# Patient Record
Sex: Female | Born: 1955 | Race: Black or African American | Hispanic: No | Smoking: Former smoker
Health system: Southern US, Community
[De-identification: ages and names within clinical notes are randomized; demographics above are authoritative.]

## PROBLEM LIST (undated history)

## (undated) DIAGNOSIS — K635 Polyp of colon: Secondary | ICD-10-CM

## (undated) DIAGNOSIS — R51 Headache: Secondary | ICD-10-CM

## (undated) DIAGNOSIS — G4733 Obstructive sleep apnea (adult) (pediatric): Secondary | ICD-10-CM

## (undated) DIAGNOSIS — M779 Enthesopathy, unspecified: Secondary | ICD-10-CM

## (undated) DIAGNOSIS — L309 Dermatitis, unspecified: Secondary | ICD-10-CM

## (undated) DIAGNOSIS — L509 Urticaria, unspecified: Secondary | ICD-10-CM

## (undated) DIAGNOSIS — F419 Anxiety disorder, unspecified: Secondary | ICD-10-CM

## (undated) DIAGNOSIS — M1990 Unspecified osteoarthritis, unspecified site: Secondary | ICD-10-CM

## (undated) DIAGNOSIS — M545 Low back pain, unspecified: Secondary | ICD-10-CM

## (undated) DIAGNOSIS — G4700 Insomnia, unspecified: Secondary | ICD-10-CM

## (undated) DIAGNOSIS — I10 Essential (primary) hypertension: Secondary | ICD-10-CM

## (undated) DIAGNOSIS — J45909 Unspecified asthma, uncomplicated: Secondary | ICD-10-CM

## (undated) DIAGNOSIS — I728 Aneurysm of other specified arteries: Secondary | ICD-10-CM

## (undated) DIAGNOSIS — F329 Major depressive disorder, single episode, unspecified: Secondary | ICD-10-CM

## (undated) DIAGNOSIS — G8929 Other chronic pain: Secondary | ICD-10-CM

## (undated) DIAGNOSIS — F32A Depression, unspecified: Secondary | ICD-10-CM

## (undated) DIAGNOSIS — E119 Type 2 diabetes mellitus without complications: Secondary | ICD-10-CM

## (undated) DIAGNOSIS — T783XXA Angioneurotic edema, initial encounter: Secondary | ICD-10-CM

## (undated) DIAGNOSIS — M810 Age-related osteoporosis without current pathological fracture: Secondary | ICD-10-CM

## (undated) DIAGNOSIS — K219 Gastro-esophageal reflux disease without esophagitis: Secondary | ICD-10-CM

## (undated) DIAGNOSIS — B1920 Unspecified viral hepatitis C without hepatic coma: Secondary | ICD-10-CM

## (undated) DIAGNOSIS — Z8679 Personal history of other diseases of the circulatory system: Secondary | ICD-10-CM

## (undated) DIAGNOSIS — I499 Cardiac arrhythmia, unspecified: Secondary | ICD-10-CM

## (undated) HISTORY — DX: Polyp of colon: K63.5

## (undated) HISTORY — DX: Major depressive disorder, single episode, unspecified: F32.9

## (undated) HISTORY — DX: Depression, unspecified: F32.A

## (undated) HISTORY — DX: Anxiety disorder, unspecified: F41.9

## (undated) HISTORY — DX: Angioneurotic edema, initial encounter: T78.3XXA

## (undated) HISTORY — PX: CARDIOVERSION: SHX1299

## (undated) HISTORY — DX: Urticaria, unspecified: L50.9

## (undated) HISTORY — DX: Essential (primary) hypertension: I10

## (undated) HISTORY — DX: Age-related osteoporosis without current pathological fracture: M81.0

## (undated) HISTORY — DX: Type 2 diabetes mellitus without complications: E11.9

## (undated) HISTORY — DX: Aneurysm of other specified arteries: I72.8

## (undated) HISTORY — DX: Obstructive sleep apnea (adult) (pediatric): G47.33

---

## 1997-04-13 ENCOUNTER — Encounter: Admission: RE | Admit: 1997-04-13 | Discharge: 1997-04-13 | Payer: Self-pay | Admitting: Family Medicine

## 1997-07-28 ENCOUNTER — Encounter: Admission: RE | Admit: 1997-07-28 | Discharge: 1997-07-28 | Payer: Self-pay | Admitting: Family Medicine

## 1997-08-18 ENCOUNTER — Encounter: Admission: RE | Admit: 1997-08-18 | Discharge: 1997-08-18 | Payer: Self-pay | Admitting: Family Medicine

## 1998-04-13 ENCOUNTER — Encounter: Admission: RE | Admit: 1998-04-13 | Discharge: 1998-04-13 | Payer: Self-pay | Admitting: Family Medicine

## 1998-04-20 ENCOUNTER — Encounter: Admission: RE | Admit: 1998-04-20 | Discharge: 1998-04-20 | Payer: Self-pay | Admitting: Family Medicine

## 1998-07-22 ENCOUNTER — Encounter: Admission: RE | Admit: 1998-07-22 | Discharge: 1998-07-22 | Payer: Self-pay | Admitting: Family Medicine

## 1998-10-27 ENCOUNTER — Inpatient Hospital Stay (HOSPITAL_COMMUNITY): Admission: EM | Admit: 1998-10-27 | Discharge: 1998-10-29 | Payer: Self-pay | Admitting: Emergency Medicine

## 1998-10-27 ENCOUNTER — Encounter: Payer: Self-pay | Admitting: Emergency Medicine

## 1998-11-21 ENCOUNTER — Encounter: Admission: RE | Admit: 1998-11-21 | Discharge: 1998-11-21 | Payer: Self-pay | Admitting: Family Medicine

## 1999-01-11 ENCOUNTER — Encounter: Admission: RE | Admit: 1999-01-11 | Discharge: 1999-01-11 | Payer: Self-pay | Admitting: Family Medicine

## 1999-01-22 ENCOUNTER — Emergency Department (HOSPITAL_COMMUNITY): Admission: EM | Admit: 1999-01-22 | Discharge: 1999-01-22 | Payer: Self-pay | Admitting: Emergency Medicine

## 1999-02-03 ENCOUNTER — Encounter: Admission: RE | Admit: 1999-02-03 | Discharge: 1999-02-03 | Payer: Self-pay | Admitting: Family Medicine

## 1999-03-03 ENCOUNTER — Encounter: Admission: RE | Admit: 1999-03-03 | Discharge: 1999-03-03 | Payer: Self-pay | Admitting: Family Medicine

## 1999-03-28 ENCOUNTER — Encounter: Admission: RE | Admit: 1999-03-28 | Discharge: 1999-03-28 | Payer: Self-pay | Admitting: Family Medicine

## 1999-04-02 ENCOUNTER — Encounter: Payer: Self-pay | Admitting: Emergency Medicine

## 1999-04-02 ENCOUNTER — Emergency Department (HOSPITAL_COMMUNITY): Admission: EM | Admit: 1999-04-02 | Discharge: 1999-04-02 | Payer: Self-pay | Admitting: Emergency Medicine

## 1999-04-14 ENCOUNTER — Encounter: Admission: RE | Admit: 1999-04-14 | Discharge: 1999-04-14 | Payer: Self-pay | Admitting: Family Medicine

## 1999-06-15 ENCOUNTER — Encounter: Admission: RE | Admit: 1999-06-15 | Discharge: 1999-06-15 | Payer: Self-pay | Admitting: Family Medicine

## 1999-06-15 ENCOUNTER — Encounter: Payer: Self-pay | Admitting: Family Medicine

## 1999-09-08 ENCOUNTER — Encounter: Admission: RE | Admit: 1999-09-08 | Discharge: 1999-09-08 | Payer: Self-pay | Admitting: Family Medicine

## 2000-02-07 ENCOUNTER — Encounter: Admission: RE | Admit: 2000-02-07 | Discharge: 2000-02-07 | Payer: Self-pay | Admitting: Family Medicine

## 2000-03-20 ENCOUNTER — Encounter: Admission: RE | Admit: 2000-03-20 | Discharge: 2000-03-20 | Payer: Self-pay | Admitting: Family Medicine

## 2000-04-10 ENCOUNTER — Encounter: Admission: RE | Admit: 2000-04-10 | Discharge: 2000-04-10 | Payer: Self-pay | Admitting: Family Medicine

## 2000-04-24 ENCOUNTER — Encounter: Admission: RE | Admit: 2000-04-24 | Discharge: 2000-04-24 | Payer: Self-pay | Admitting: Family Medicine

## 2000-05-01 ENCOUNTER — Encounter: Admission: RE | Admit: 2000-05-01 | Discharge: 2000-05-01 | Payer: Self-pay | Admitting: Family Medicine

## 2000-05-15 ENCOUNTER — Encounter: Admission: RE | Admit: 2000-05-15 | Discharge: 2000-05-15 | Payer: Self-pay | Admitting: Family Medicine

## 2000-07-03 ENCOUNTER — Encounter: Admission: RE | Admit: 2000-07-03 | Discharge: 2000-07-03 | Payer: Self-pay | Admitting: Family Medicine

## 2000-08-05 ENCOUNTER — Emergency Department (HOSPITAL_COMMUNITY): Admission: EM | Admit: 2000-08-05 | Discharge: 2000-08-05 | Payer: Self-pay | Admitting: Emergency Medicine

## 2000-09-04 ENCOUNTER — Encounter: Admission: RE | Admit: 2000-09-04 | Discharge: 2000-09-04 | Payer: Self-pay | Admitting: Family Medicine

## 2001-05-28 ENCOUNTER — Encounter: Admission: RE | Admit: 2001-05-28 | Discharge: 2001-05-28 | Payer: Self-pay | Admitting: Family Medicine

## 2001-06-06 ENCOUNTER — Encounter: Payer: Self-pay | Admitting: Family Medicine

## 2001-06-06 ENCOUNTER — Encounter: Admission: RE | Admit: 2001-06-06 | Discharge: 2001-06-06 | Payer: Self-pay | Admitting: Family Medicine

## 2001-06-25 ENCOUNTER — Encounter: Admission: RE | Admit: 2001-06-25 | Discharge: 2001-06-25 | Payer: Self-pay | Admitting: Family Medicine

## 2001-06-25 ENCOUNTER — Encounter (INDEPENDENT_AMBULATORY_CARE_PROVIDER_SITE_OTHER): Payer: Self-pay | Admitting: *Deleted

## 2001-07-25 ENCOUNTER — Encounter: Admission: RE | Admit: 2001-07-25 | Discharge: 2001-07-25 | Payer: Self-pay | Admitting: Family Medicine

## 2001-09-10 ENCOUNTER — Encounter: Admission: RE | Admit: 2001-09-10 | Discharge: 2001-09-10 | Payer: Self-pay | Admitting: Family Medicine

## 2001-12-31 ENCOUNTER — Encounter: Admission: RE | Admit: 2001-12-31 | Discharge: 2001-12-31 | Payer: Self-pay | Admitting: Family Medicine

## 2002-01-23 ENCOUNTER — Encounter: Admission: RE | Admit: 2002-01-23 | Discharge: 2002-01-23 | Payer: Self-pay | Admitting: Family Medicine

## 2002-05-26 ENCOUNTER — Encounter: Admission: RE | Admit: 2002-05-26 | Discharge: 2002-05-26 | Payer: Self-pay | Admitting: Family Medicine

## 2002-06-08 ENCOUNTER — Encounter: Payer: Self-pay | Admitting: Family Medicine

## 2002-06-08 ENCOUNTER — Encounter: Admission: RE | Admit: 2002-06-08 | Discharge: 2002-06-08 | Payer: Self-pay | Admitting: Family Medicine

## 2002-06-17 ENCOUNTER — Encounter: Admission: RE | Admit: 2002-06-17 | Discharge: 2002-06-17 | Payer: Self-pay | Admitting: Family Medicine

## 2003-03-17 ENCOUNTER — Encounter: Admission: RE | Admit: 2003-03-17 | Discharge: 2003-03-17 | Payer: Self-pay | Admitting: Family Medicine

## 2003-04-21 ENCOUNTER — Encounter (INDEPENDENT_AMBULATORY_CARE_PROVIDER_SITE_OTHER): Payer: Self-pay | Admitting: Family Medicine

## 2003-04-21 ENCOUNTER — Encounter: Admission: RE | Admit: 2003-04-21 | Discharge: 2003-04-21 | Payer: Self-pay | Admitting: Family Medicine

## 2003-04-28 ENCOUNTER — Encounter: Admission: RE | Admit: 2003-04-28 | Discharge: 2003-04-28 | Payer: Self-pay | Admitting: Family Medicine

## 2003-05-03 ENCOUNTER — Ambulatory Visit (HOSPITAL_COMMUNITY): Admission: RE | Admit: 2003-05-03 | Discharge: 2003-05-03 | Payer: Self-pay | Admitting: Family Medicine

## 2003-05-19 ENCOUNTER — Encounter: Payer: Self-pay | Admitting: Cardiology

## 2003-05-19 ENCOUNTER — Ambulatory Visit (HOSPITAL_COMMUNITY): Admission: RE | Admit: 2003-05-19 | Discharge: 2003-05-19 | Payer: Self-pay | Admitting: Family Medicine

## 2003-05-26 ENCOUNTER — Encounter: Admission: RE | Admit: 2003-05-26 | Discharge: 2003-05-26 | Payer: Self-pay | Admitting: Family Medicine

## 2003-08-18 ENCOUNTER — Encounter: Admission: RE | Admit: 2003-08-18 | Discharge: 2003-08-18 | Payer: Self-pay | Admitting: Sports Medicine

## 2003-09-15 ENCOUNTER — Ambulatory Visit: Payer: Self-pay | Admitting: Family Medicine

## 2004-01-05 ENCOUNTER — Ambulatory Visit: Payer: Self-pay | Admitting: Family Medicine

## 2004-02-02 ENCOUNTER — Ambulatory Visit: Payer: Self-pay | Admitting: Family Medicine

## 2004-08-16 ENCOUNTER — Ambulatory Visit: Payer: Self-pay | Admitting: Family Medicine

## 2004-10-08 ENCOUNTER — Encounter (INDEPENDENT_AMBULATORY_CARE_PROVIDER_SITE_OTHER): Payer: Self-pay | Admitting: *Deleted

## 2004-10-08 LAB — CONVERTED CEMR LAB

## 2004-10-09 ENCOUNTER — Encounter (INDEPENDENT_AMBULATORY_CARE_PROVIDER_SITE_OTHER): Payer: Self-pay | Admitting: Family Medicine

## 2004-10-09 LAB — CONVERTED CEMR LAB: Pap Smear: NORMAL

## 2004-10-11 ENCOUNTER — Encounter (INDEPENDENT_AMBULATORY_CARE_PROVIDER_SITE_OTHER): Payer: Self-pay | Admitting: Family Medicine

## 2004-10-11 ENCOUNTER — Ambulatory Visit: Payer: Self-pay | Admitting: Family Medicine

## 2004-10-17 ENCOUNTER — Ambulatory Visit: Payer: Self-pay | Admitting: Sports Medicine

## 2004-10-25 ENCOUNTER — Ambulatory Visit (HOSPITAL_COMMUNITY): Admission: RE | Admit: 2004-10-25 | Discharge: 2004-10-25 | Payer: Self-pay | Admitting: Family Medicine

## 2004-11-21 ENCOUNTER — Ambulatory Visit (HOSPITAL_COMMUNITY): Admission: RE | Admit: 2004-11-21 | Discharge: 2004-11-21 | Payer: Self-pay | Admitting: Internal Medicine

## 2005-01-31 ENCOUNTER — Ambulatory Visit: Payer: Self-pay | Admitting: Family Medicine

## 2005-03-14 ENCOUNTER — Ambulatory Visit: Payer: Self-pay | Admitting: Family Medicine

## 2005-09-12 ENCOUNTER — Ambulatory Visit: Payer: Self-pay | Admitting: Family Medicine

## 2005-09-19 ENCOUNTER — Ambulatory Visit: Payer: Self-pay | Admitting: Family Medicine

## 2006-01-15 ENCOUNTER — Ambulatory Visit (HOSPITAL_COMMUNITY): Admission: RE | Admit: 2006-01-15 | Discharge: 2006-01-15 | Payer: Self-pay | Admitting: Family Medicine

## 2006-03-07 DIAGNOSIS — E1159 Type 2 diabetes mellitus with other circulatory complications: Secondary | ICD-10-CM | POA: Insufficient documentation

## 2006-03-07 DIAGNOSIS — I10 Essential (primary) hypertension: Secondary | ICD-10-CM | POA: Insufficient documentation

## 2006-03-07 DIAGNOSIS — N393 Stress incontinence (female) (male): Secondary | ICD-10-CM | POA: Insufficient documentation

## 2006-03-07 DIAGNOSIS — K219 Gastro-esophageal reflux disease without esophagitis: Secondary | ICD-10-CM | POA: Insufficient documentation

## 2006-03-07 DIAGNOSIS — F329 Major depressive disorder, single episode, unspecified: Secondary | ICD-10-CM

## 2006-03-07 DIAGNOSIS — M545 Low back pain: Secondary | ICD-10-CM

## 2006-03-07 DIAGNOSIS — G8929 Other chronic pain: Secondary | ICD-10-CM | POA: Insufficient documentation

## 2006-03-07 DIAGNOSIS — I152 Hypertension secondary to endocrine disorders: Secondary | ICD-10-CM | POA: Insufficient documentation

## 2006-03-07 DIAGNOSIS — E669 Obesity, unspecified: Secondary | ICD-10-CM | POA: Insufficient documentation

## 2006-03-07 DIAGNOSIS — G4700 Insomnia, unspecified: Secondary | ICD-10-CM | POA: Insufficient documentation

## 2006-03-07 DIAGNOSIS — F339 Major depressive disorder, recurrent, unspecified: Secondary | ICD-10-CM | POA: Insufficient documentation

## 2006-03-08 ENCOUNTER — Encounter (INDEPENDENT_AMBULATORY_CARE_PROVIDER_SITE_OTHER): Payer: Self-pay | Admitting: *Deleted

## 2006-04-10 ENCOUNTER — Telehealth: Payer: Self-pay | Admitting: *Deleted

## 2006-04-16 ENCOUNTER — Ambulatory Visit: Payer: Self-pay | Admitting: Family Medicine

## 2006-05-01 ENCOUNTER — Ambulatory Visit (HOSPITAL_COMMUNITY): Admission: RE | Admit: 2006-05-01 | Discharge: 2006-05-01 | Payer: Self-pay | Admitting: Family Medicine

## 2006-05-07 ENCOUNTER — Telehealth: Payer: Self-pay | Admitting: *Deleted

## 2006-05-09 ENCOUNTER — Ambulatory Visit: Payer: Self-pay | Admitting: Sports Medicine

## 2006-07-03 ENCOUNTER — Telehealth (INDEPENDENT_AMBULATORY_CARE_PROVIDER_SITE_OTHER): Payer: Self-pay | Admitting: *Deleted

## 2006-07-04 ENCOUNTER — Ambulatory Visit: Payer: Self-pay | Admitting: Family Medicine

## 2006-11-11 ENCOUNTER — Ambulatory Visit: Payer: Self-pay | Admitting: Family Medicine

## 2006-11-19 ENCOUNTER — Encounter (INDEPENDENT_AMBULATORY_CARE_PROVIDER_SITE_OTHER): Payer: Self-pay | Admitting: Family Medicine

## 2006-11-21 ENCOUNTER — Encounter (INDEPENDENT_AMBULATORY_CARE_PROVIDER_SITE_OTHER): Payer: Self-pay | Admitting: Family Medicine

## 2006-11-26 ENCOUNTER — Ambulatory Visit: Payer: Self-pay | Admitting: Family Medicine

## 2006-11-27 ENCOUNTER — Encounter (INDEPENDENT_AMBULATORY_CARE_PROVIDER_SITE_OTHER): Payer: Self-pay | Admitting: *Deleted

## 2006-12-23 ENCOUNTER — Telehealth: Payer: Self-pay | Admitting: *Deleted

## 2006-12-26 ENCOUNTER — Ambulatory Visit: Payer: Self-pay | Admitting: Family Medicine

## 2006-12-26 ENCOUNTER — Encounter (INDEPENDENT_AMBULATORY_CARE_PROVIDER_SITE_OTHER): Payer: Self-pay | Admitting: Family Medicine

## 2006-12-26 ENCOUNTER — Ambulatory Visit (HOSPITAL_COMMUNITY): Admission: RE | Admit: 2006-12-26 | Discharge: 2006-12-26 | Payer: Self-pay | Admitting: Family Medicine

## 2007-01-15 ENCOUNTER — Encounter (INDEPENDENT_AMBULATORY_CARE_PROVIDER_SITE_OTHER): Payer: Self-pay | Admitting: Family Medicine

## 2007-01-27 ENCOUNTER — Telehealth (INDEPENDENT_AMBULATORY_CARE_PROVIDER_SITE_OTHER): Payer: Self-pay | Admitting: Family Medicine

## 2007-02-15 ENCOUNTER — Emergency Department (HOSPITAL_COMMUNITY): Admission: EM | Admit: 2007-02-15 | Discharge: 2007-02-15 | Payer: Self-pay | Admitting: Family Medicine

## 2007-02-27 ENCOUNTER — Telehealth: Payer: Self-pay | Admitting: *Deleted

## 2007-02-28 ENCOUNTER — Encounter (INDEPENDENT_AMBULATORY_CARE_PROVIDER_SITE_OTHER): Payer: Self-pay | Admitting: Family Medicine

## 2007-02-28 ENCOUNTER — Ambulatory Visit: Payer: Self-pay | Admitting: Family Medicine

## 2007-02-28 DIAGNOSIS — H8110 Benign paroxysmal vertigo, unspecified ear: Secondary | ICD-10-CM | POA: Insufficient documentation

## 2007-02-28 LAB — CONVERTED CEMR LAB
BUN: 9 mg/dL (ref 6–23)
CO2: 23 meq/L (ref 19–32)
Calcium: 9.2 mg/dL (ref 8.4–10.5)
Chloride: 106 meq/L (ref 96–112)
Creatinine, Ser: 0.54 mg/dL (ref 0.40–1.20)
Glucose, Bld: 109 mg/dL — ABNORMAL HIGH (ref 70–99)
HCT: 40.7 % (ref 36.0–46.0)
Hemoglobin: 12.9 g/dL (ref 12.0–15.0)
Platelets: 185 10*3/uL (ref 150–400)
RBC: 5.09 M/uL (ref 3.87–5.11)
RDW: 13.6 % (ref 11.5–15.5)
VLDL: 27 mg/dL (ref 0–40)
WBC: 5.9 10*3/uL (ref 4.0–10.5)

## 2007-03-06 ENCOUNTER — Telehealth (INDEPENDENT_AMBULATORY_CARE_PROVIDER_SITE_OTHER): Payer: Self-pay | Admitting: Family Medicine

## 2007-03-14 ENCOUNTER — Ambulatory Visit: Payer: Self-pay | Admitting: Family Medicine

## 2007-03-18 ENCOUNTER — Emergency Department (HOSPITAL_COMMUNITY): Admission: EM | Admit: 2007-03-18 | Discharge: 2007-03-19 | Payer: Self-pay | Admitting: Emergency Medicine

## 2007-03-24 ENCOUNTER — Telehealth: Payer: Self-pay | Admitting: *Deleted

## 2007-03-28 ENCOUNTER — Telehealth: Payer: Self-pay | Admitting: *Deleted

## 2007-04-01 ENCOUNTER — Ambulatory Visit: Payer: Self-pay | Admitting: Family Medicine

## 2007-04-02 ENCOUNTER — Telehealth (INDEPENDENT_AMBULATORY_CARE_PROVIDER_SITE_OTHER): Payer: Self-pay | Admitting: Family Medicine

## 2007-04-05 ENCOUNTER — Encounter: Admission: RE | Admit: 2007-04-05 | Discharge: 2007-04-05 | Payer: Self-pay | Admitting: Family Medicine

## 2007-04-24 ENCOUNTER — Ambulatory Visit: Payer: Self-pay | Admitting: Family Medicine

## 2007-05-02 ENCOUNTER — Ambulatory Visit: Payer: Self-pay | Admitting: Family Medicine

## 2007-05-22 ENCOUNTER — Telehealth: Payer: Self-pay | Admitting: *Deleted

## 2007-05-23 ENCOUNTER — Encounter (INDEPENDENT_AMBULATORY_CARE_PROVIDER_SITE_OTHER): Payer: Self-pay | Admitting: *Deleted

## 2007-06-23 ENCOUNTER — Encounter (INDEPENDENT_AMBULATORY_CARE_PROVIDER_SITE_OTHER): Payer: Self-pay | Admitting: Family Medicine

## 2007-06-24 ENCOUNTER — Encounter (INDEPENDENT_AMBULATORY_CARE_PROVIDER_SITE_OTHER): Payer: Self-pay | Admitting: *Deleted

## 2007-07-24 ENCOUNTER — Telehealth: Payer: Self-pay | Admitting: *Deleted

## 2007-07-30 ENCOUNTER — Encounter (INDEPENDENT_AMBULATORY_CARE_PROVIDER_SITE_OTHER): Payer: Self-pay | Admitting: Family Medicine

## 2007-07-30 ENCOUNTER — Ambulatory Visit: Payer: Self-pay | Admitting: Family Medicine

## 2007-07-30 DIAGNOSIS — F411 Generalized anxiety disorder: Secondary | ICD-10-CM | POA: Insufficient documentation

## 2007-07-30 LAB — CONVERTED CEMR LAB
Free T4: 0.98 ng/dL (ref 0.89–1.80)
T3, Free: 3.2 pg/mL (ref 2.3–4.2)

## 2007-07-31 ENCOUNTER — Encounter (INDEPENDENT_AMBULATORY_CARE_PROVIDER_SITE_OTHER): Payer: Self-pay | Admitting: Family Medicine

## 2007-07-31 LAB — CONVERTED CEMR LAB
ALT: 31 units/L (ref 0–35)
Albumin: 3.9 g/dL (ref 3.5–5.2)
Chloride: 106 meq/L (ref 96–112)
Glucose, Bld: 94 mg/dL (ref 70–99)
Potassium: 4.2 meq/L (ref 3.5–5.3)
Total Protein: 7.1 g/dL (ref 6.0–8.3)

## 2007-08-01 ENCOUNTER — Telehealth: Payer: Self-pay | Admitting: *Deleted

## 2007-08-01 ENCOUNTER — Telehealth (INDEPENDENT_AMBULATORY_CARE_PROVIDER_SITE_OTHER): Payer: Self-pay | Admitting: Family Medicine

## 2007-09-10 ENCOUNTER — Ambulatory Visit: Payer: Self-pay | Admitting: Family Medicine

## 2007-09-11 ENCOUNTER — Telehealth (INDEPENDENT_AMBULATORY_CARE_PROVIDER_SITE_OTHER): Payer: Self-pay | Admitting: *Deleted

## 2007-10-13 ENCOUNTER — Ambulatory Visit: Payer: Self-pay | Admitting: Family Medicine

## 2008-01-14 ENCOUNTER — Telehealth: Payer: Self-pay | Admitting: Family Medicine

## 2008-03-16 ENCOUNTER — Ambulatory Visit: Payer: Self-pay | Admitting: Family Medicine

## 2008-03-18 ENCOUNTER — Ambulatory Visit (HOSPITAL_COMMUNITY): Admission: RE | Admit: 2008-03-18 | Discharge: 2008-03-18 | Payer: Self-pay | Admitting: Family Medicine

## 2008-04-06 ENCOUNTER — Ambulatory Visit: Payer: Self-pay | Admitting: Family Medicine

## 2008-05-25 ENCOUNTER — Ambulatory Visit: Payer: Self-pay | Admitting: Family Medicine

## 2008-06-24 ENCOUNTER — Ambulatory Visit: Payer: Self-pay | Admitting: Family Medicine

## 2008-06-24 ENCOUNTER — Encounter (INDEPENDENT_AMBULATORY_CARE_PROVIDER_SITE_OTHER): Payer: Self-pay | Admitting: Family Medicine

## 2008-06-29 LAB — CONVERTED CEMR LAB
CO2: 29 meq/L (ref 19–32)
Glucose, Bld: 115 mg/dL — ABNORMAL HIGH (ref 70–99)
Hemoglobin: 12.7 g/dL (ref 12.0–15.0)
Iron: 64 ug/dL (ref 42–145)
Platelets: 182 10*3/uL (ref 150–400)
Potassium: 3.9 meq/L (ref 3.5–5.3)
RDW: 14.1 % (ref 11.5–15.5)
Saturation Ratios: 14 % — ABNORMAL LOW (ref 20–55)
Sodium: 141 meq/L (ref 135–145)
UIBC: 382 ug/dL
WBC: 6.1 10*3/uL (ref 4.0–10.5)

## 2008-10-11 ENCOUNTER — Telehealth: Payer: Self-pay | Admitting: *Deleted

## 2008-10-12 ENCOUNTER — Telehealth: Payer: Self-pay | Admitting: Family Medicine

## 2008-11-24 ENCOUNTER — Ambulatory Visit: Payer: Self-pay | Admitting: Family Medicine

## 2008-11-28 ENCOUNTER — Encounter: Payer: Self-pay | Admitting: Family Medicine

## 2009-01-08 LAB — HM COLONOSCOPY

## 2009-01-10 ENCOUNTER — Ambulatory Visit: Payer: Self-pay | Admitting: Family Medicine

## 2009-01-10 ENCOUNTER — Encounter (INDEPENDENT_AMBULATORY_CARE_PROVIDER_SITE_OTHER): Payer: Self-pay | Admitting: *Deleted

## 2009-01-10 DIAGNOSIS — R252 Cramp and spasm: Secondary | ICD-10-CM | POA: Insufficient documentation

## 2009-01-12 ENCOUNTER — Encounter: Payer: Self-pay | Admitting: Family Medicine

## 2009-01-12 ENCOUNTER — Ambulatory Visit (HOSPITAL_COMMUNITY): Admission: RE | Admit: 2009-01-12 | Discharge: 2009-01-12 | Payer: Self-pay | Admitting: Family Medicine

## 2009-01-20 ENCOUNTER — Ambulatory Visit: Payer: Self-pay | Admitting: Family Medicine

## 2009-01-20 ENCOUNTER — Ambulatory Visit (HOSPITAL_COMMUNITY): Admission: RE | Admit: 2009-01-20 | Discharge: 2009-01-20 | Payer: Self-pay | Admitting: Family Medicine

## 2009-01-20 ENCOUNTER — Encounter: Payer: Self-pay | Admitting: Family Medicine

## 2009-01-20 LAB — CONVERTED CEMR LAB
HCT: 40.4 % (ref 36.0–46.0)
MCHC: 31.9 g/dL (ref 30.0–36.0)
Platelets: 183 10*3/uL (ref 150–400)
RBC: 5.07 M/uL (ref 3.87–5.11)
RDW: 13.8 % (ref 11.5–15.5)

## 2009-01-24 ENCOUNTER — Encounter: Payer: Self-pay | Admitting: Family Medicine

## 2009-02-01 ENCOUNTER — Ambulatory Visit: Payer: Self-pay | Admitting: Gastroenterology

## 2009-04-06 ENCOUNTER — Ambulatory Visit: Payer: Self-pay | Admitting: Family Medicine

## 2009-04-06 ENCOUNTER — Encounter: Payer: Self-pay | Admitting: Family Medicine

## 2009-04-08 ENCOUNTER — Telehealth: Payer: Self-pay | Admitting: Family Medicine

## 2009-04-12 ENCOUNTER — Telehealth: Payer: Self-pay | Admitting: Psychology

## 2009-04-12 LAB — CONVERTED CEMR LAB
ALT: 40 units/L — ABNORMAL HIGH (ref 0–35)
Alkaline Phosphatase: 81 units/L (ref 39–117)
Creatinine, Ser: 0.57 mg/dL (ref 0.40–1.20)
Eosinophils Relative: 5 % (ref 0–5)
Glucose, Bld: 105 mg/dL — ABNORMAL HIGH (ref 70–99)
Lymphocytes Relative: 43 % (ref 12–46)
Lymphs Abs: 2.1 10*3/uL (ref 0.7–4.0)
Monocytes Absolute: 0.4 10*3/uL (ref 0.1–1.0)
Monocytes Relative: 7 % (ref 3–12)
Neutro Abs: 2.2 10*3/uL (ref 1.7–7.7)
Neutrophils Relative %: 44 % (ref 43–77)
Potassium: 4 meq/L (ref 3.5–5.3)
Total Protein: 7.6 g/dL (ref 6.0–8.3)
Vitamin B-12: 556 pg/mL (ref 211–911)

## 2009-04-18 ENCOUNTER — Telehealth: Payer: Self-pay | Admitting: Family Medicine

## 2009-04-22 ENCOUNTER — Telehealth: Payer: Self-pay | Admitting: Family Medicine

## 2009-04-27 ENCOUNTER — Ambulatory Visit: Payer: Self-pay | Admitting: Psychology

## 2009-05-02 ENCOUNTER — Ambulatory Visit: Payer: Self-pay | Admitting: Family Medicine

## 2009-05-24 ENCOUNTER — Telehealth: Payer: Self-pay | Admitting: Psychology

## 2009-05-26 ENCOUNTER — Ambulatory Visit: Payer: Self-pay | Admitting: Family Medicine

## 2009-05-26 ENCOUNTER — Encounter: Payer: Self-pay | Admitting: Family Medicine

## 2009-06-01 LAB — CONVERTED CEMR LAB
HDL: 39 mg/dL — ABNORMAL LOW (ref >39)
LDL Cholesterol: 78 mg/dL (ref 0–99)
Triglycerides: 144 mg/dL (ref <150)
VLDL: 29 mg/dL (ref 0–40)

## 2009-06-27 ENCOUNTER — Telehealth (INDEPENDENT_AMBULATORY_CARE_PROVIDER_SITE_OTHER): Payer: Self-pay | Admitting: Family Medicine

## 2009-07-07 ENCOUNTER — Ambulatory Visit: Payer: Self-pay | Admitting: Family Medicine

## 2009-07-14 ENCOUNTER — Ambulatory Visit (HOSPITAL_COMMUNITY): Admission: RE | Admit: 2009-07-14 | Discharge: 2009-07-14 | Payer: Self-pay | Admitting: Family Medicine

## 2009-07-19 ENCOUNTER — Ambulatory Visit: Payer: Self-pay | Admitting: Family Medicine

## 2009-08-02 ENCOUNTER — Ambulatory Visit: Payer: Self-pay | Admitting: Family Medicine

## 2009-08-03 ENCOUNTER — Telehealth: Payer: Self-pay | Admitting: *Deleted

## 2009-08-03 ENCOUNTER — Telehealth: Payer: Self-pay | Admitting: Family Medicine

## 2009-08-04 ENCOUNTER — Telehealth: Payer: Self-pay | Admitting: Family Medicine

## 2009-08-09 ENCOUNTER — Encounter (INDEPENDENT_AMBULATORY_CARE_PROVIDER_SITE_OTHER): Payer: Self-pay | Admitting: *Deleted

## 2009-08-10 ENCOUNTER — Telehealth (INDEPENDENT_AMBULATORY_CARE_PROVIDER_SITE_OTHER): Payer: Self-pay | Admitting: Family Medicine

## 2009-08-11 ENCOUNTER — Ambulatory Visit: Payer: Self-pay | Admitting: Gastroenterology

## 2009-08-25 ENCOUNTER — Ambulatory Visit: Payer: Self-pay | Admitting: Gastroenterology

## 2009-08-26 ENCOUNTER — Encounter: Payer: Self-pay | Admitting: Gastroenterology

## 2009-09-08 ENCOUNTER — Telehealth (INDEPENDENT_AMBULATORY_CARE_PROVIDER_SITE_OTHER): Payer: Self-pay | Admitting: Family Medicine

## 2009-11-22 ENCOUNTER — Ambulatory Visit: Payer: Self-pay | Admitting: Family Medicine

## 2010-01-16 ENCOUNTER — Encounter: Payer: Self-pay | Admitting: Family Medicine

## 2010-01-29 ENCOUNTER — Encounter: Payer: Self-pay | Admitting: Family Medicine

## 2010-02-07 NOTE — Miscellaneous (Signed)
Summary: EKG and CBC future orders  Clinical Lists Changes  Problems: Added new problem of CARDIAC MURMUR (ICD-785.2) Orders: Added new Test order of 12 Lead EKG (12 Lead EKG) - Signed Added new Test order of [facility] (56213) - Signed

## 2010-02-07 NOTE — Progress Notes (Signed)
Summary: meds prob  Phone Note Call from Patient Call back at Home Phone [phone]   Caller: Patient Summary of Call: states that AVELOX 400 MG TABS cost over $100 and cannot afford that.  needs something cheaper. Initial call taken by: De Nurse,  August 04, 2009 9:10 AM  Follow-up for Phone Call         to pcp Follow-up by: Golden Circle RN,  August 04, 2009 9:13 AM  Additional Follow-up for Phone Call Additional follow up Details #1::        let her know I switched it to a PCN that she will need to take 4 times a day but it should be cheaper.  Additional Follow-up by: Jamie Brookes MD,  August 04, 2009 10:23 AM    Additional Follow-up for Phone Call Additional follow up Details #2::    pt notified Follow-up by: Golden Circle RN,  August 04, 2009 12:04 PM  New/Updated Medications: PENICILLIN V POTASSIUM 500 MG TABS (PENICILLIN V POTASSIUM) take 1 pill every 6 hours for tooth infection with fevers, chills, redness or pus coming from the broken tooth site. Take for 10 days Prescriptions: PENICILLIN V POTASSIUM 500 MG TABS (PENICILLIN V POTASSIUM) take 1 pill every 6 hours for tooth infection with fevers, chills, redness or pus coming from the broken tooth site. Take for 10 days  #60 x 0   Entered and Authorized by:   Jamie Brookes MD   Signed by:   Jamie Brookes MD on 08/04/2009   Method used:   Electronically to        [facility] (retail)       [address]       [place], [place]  [id number]       Ph: [phone]       Fax: [phone]   RxID:   [id number]

## 2010-02-07 NOTE — Progress Notes (Signed)
Summary: Schedule Initial beh-med  Phone Note Call from Patient   Caller: Patient Call For: Spero Geralds, Psy.D. Summary of Call: Patient called to schedule an initial beh-med.  April 20th at 3:00. Initial call taken by: Spero Geralds PsyD,  April 12, 2009 2:03 PM

## 2010-02-07 NOTE — Assessment & Plan Note (Signed)
Summary: F/U leg and back pain.    Vital Signs:  Patient profile:   55 year old female Weight:      206.6 pounds Temp:     98.1 degrees F oral Pulse rate:   108 / minute Pulse rhythm:   regular BP sitting:   142 / 85  (left arm) Cuff size:   large  Vitals Entered By: Loralee Pacas CMA (July 19, 2009 3:07 PM) CC: leg pain Is Patient Diabetic? No Pain Assessment Patient in pain? yes     Location: lower back Intensity: 10 Type: aching Onset of pain  Chronic   Primary Care Provider:  Jamie Brookes MD  CC:  leg pain.  History of Present Illness: Pt here for follow up of back and leg pain: Pt has been having some "charlie horse" pain in her legs. They are tender almost all the time. She says without the tramadol her pain is 10/10 but with the Tramadol it is 8/10. She says these painful episodes happen every toher day. She feels her legs tightening up and forming "knots" under the skin. They last for 30 minutes and then go away leaving her legs feeling sore. She also has back pain. Unchanged from prior visit. x-rays reviewed.   Habits & Providers  Alcohol-Tobacco-Diet     Tobacco Status: never     Tobacco Counseling: not indicated; no tobacco use  Current Medications (verified): 1)  Aspirin Childrens 81 Mg Chew (Aspirin) .... Take 1 Tablet By Mouth Once A Day 2)  Hydrochlorothiazide 25 Mg Tabs (Hydrochlorothiazide) .Marland Kitchen.. 1 Tab By Mouth Daily 3)  Ventolin Hfa 108 (90 Base) Mcg/act Aers (Albuterol Sulfate) .... Inhale 2 Puff As Directed Every Four Hours 4)  Sertraline Hcl 100 Mg Tabs (Sertraline Hcl) .Marland Kitchen.. 1 Tab By Mouth At Bedtime 5)  Amlodipine Besylate 10 Mg Tabs (Amlodipine Besylate) .Marland Kitchen.. 1 Tab By Mouth Daily 6)  Carvedilol 3.125 Mg  Tabs (Carvedilol) .... One Tablet Twice A Day For Blood Pressure 7)  Ambien 10 Mg  Tabs (Zolpidem Tartrate) .... Take One Tablet At Bedtime For Insomnia 8)  Alprazolam 0.5 Mg  Tabs (Alprazolam) .Marland Kitchen.. 1 Tab By Mouth Two Times A Day As Needed  Anxity 9)  Meclizine Hcl 25 Mg Tabs (Meclizine Hcl) .... Take One Tab By Mouth Two Times A Day For Dizziness 10)  Tramadol Hcl 50 Mg Tabs (Tramadol Hcl) .... Take One Pill Every 6 Hours 11)  Carvedilol 6.25 Mg Tabs (Carvedilol) .... Take One By Mouth Twice Daily For Blood Pressure. 12)  Cyclobenzaprine Hcl 10 Mg Tabs (Cyclobenzaprine Hcl) .... Take 1 Tab At Night  Allergies (verified): No Known Drug Allergies  Social History: Smoking Status:  never  Review of Systems        vitals reviewed and pertinent negatives and positives seen in HPI   Physical Exam  General:  Well-developed,well-nourished,in no acute distress; alert,appropriate and cooperative throughout examination Lungs:  Normal respiratory effort, chest expands symmetrically. Lungs are clear to auscultation, no crackles or wheezes. Heart:  Normal rate and regular rhythm. S1 and S2 normal without gallop, murmur, click, rub or other extra sounds. Msk:  back tenderness in lumbar and sacral region. Tenderness over the bones bilaterally  Neurologic:  upper body strength is normal, lower body strenght is decreased bilaterally.  Psych:  Cognition and judgment appear intact. Alert and cooperative with normal attention span and concentration. No apparent delusions, illusions, hallucinations   Impression & Recommendations:  Problem # 1:  LEG PAIN, BILATERAL (ICD-729.5)  Assessment Deteriorated  Pt is having worsing leg pain and cramps. Plan to give the patient Flexeril to see if a muscle relaxer will stop the leg cramping. Pt is to take 1 each night but given enought to take during the day if she needs it. Pt is suppose to start PT soon. RTC after she has been in PT for 4 weeks.   Orders: [facility]- Est Level  3 (04540)  Complete Medication List: 1)  Aspirin Childrens 81 Mg Chew (Aspirin) .... Take 1 tablet by mouth once a day 2)  Hydrochlorothiazide 25 Mg Tabs (Hydrochlorothiazide) .Marland Kitchen.. 1 tab by mouth daily 3)  Ventolin Hfa 108 (90  Base) Mcg/act Aers (Albuterol sulfate) .... Inhale 2 puff as directed every four hours 4)  Sertraline Hcl 100 Mg Tabs (Sertraline hcl) .Marland Kitchen.. 1 tab by mouth at bedtime 5)  Amlodipine Besylate 10 Mg Tabs (Amlodipine besylate) .Marland Kitchen.. 1 tab by mouth daily 6)  Carvedilol 3.125 Mg Tabs (Carvedilol) .... One tablet twice a day for blood pressure 7)  Ambien 10 Mg Tabs (Zolpidem tartrate) .... Take one tablet at bedtime for insomnia 8)  Alprazolam 0.5 Mg Tabs (Alprazolam) .Marland Kitchen.. 1 tab by mouth two times a day as needed anxity 9)  Meclizine Hcl 25 Mg Tabs (Meclizine hcl) .... Take one tab by mouth two times a day for dizziness 10)  Tramadol Hcl 50 Mg Tabs (Tramadol hcl) .... Take one pill every 6 hours 11)  Carvedilol 6.25 Mg Tabs (Carvedilol) .... Take one by mouth twice daily for blood pressure. 12)  Cyclobenzaprine Hcl 10 Mg Tabs (Cyclobenzaprine hcl) .... Take 1 tab at night  Patient Instructions: 1)  Visit Rudell Cobb 2)  Set up colonoscopy 3)  Set up PT 4)  Call me when colonscopy is set up,  5)  Come back to see me when you have been in PT about 3-4 weeks.  Prescriptions: CYCLOBENZAPRINE HCL 10 MG TABS (CYCLOBENZAPRINE HCL) take 1 tab at night  #60 x 3   Entered and Authorized by:   Jamie Brookes MD   Signed by:   Jamie Brookes MD on 07/19/2009   Method used:   Electronically to        [facility] (retail)       [address]       [place], [place]  [id number]       Ph: [phone]       Fax: [phone]   RxID:   [id number]

## 2010-02-07 NOTE — Progress Notes (Signed)
Summary: Rx Prob: sent to [facility]  Phone Note Call from Patient Call back at [phone]   Caller: Patient Summary of Call: The antidepressant that was sent in for her was to expensive.  Is there something else she can take that would be cheaper.   Initial call taken by: Clydell Hakim,  April 08, 2009 10:54 AM  Follow-up for Phone Call        will forward to MD. Follow-up by: Theresia Lo RN,  April 08, 2009 10:58 AM  Additional Follow-up for Phone Call Additional follow up Details #1::        Talked to patient. [facility] wanted over 200 dollars for the Rx and it was at her discounted rate. Told her that [facility] and to call them to ask about the price. If it is too much i will change it to something else. Pt agrees. She will cont taking Sertraline until we get her meds figured out. she has made appt with Dr. Pascal Lux. Affirmed pt's decision to be seen.  Additional Follow-up by: Jamie Brookes MD,  April 10, 2009 8:45 PM    Prescriptions: VENLAFAXINE HCL 37.5 MG XR24H-TAB (VENLAFAXINE HCL) take one tablet daily for 7 days, then increase to 2 tablets daily.  #60 x 3   Entered and Authorized by:   Jamie Brookes MD   Signed by:   Jamie Brookes MD on 04/12/2009   Method used:   Printed then faxed to ...       [facility] (retail)       [address] [place], [place]  [address]       Ph: [phone]       Fax: [phone]   RxID:   [id number]

## 2010-02-07 NOTE — Letter (Signed)
Summary: Patient Notice-Hyperplastic Polyps  [facility]  [address] [place], [place] [address]   Phone: [phone]  Fax: [phone]        August 26, 2009 MRN: [id number]    [facility] [address] [place], [place]  [address]    Dear Ms. 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  These types of polyps are NOT pre-cancerous.  It is therefore my recommendation that you have a repeat colonoscopy examination in 10_ years for routine colorectal cancer screening.  Should you develop new or worsening symptoms of abdominal pain, bowel habit changes or bleeding from the rectum or bowels, please schedule an evaluation with either your primary care physician or with me.  Additional information/recommendations:  __No further action with gastroenterology is needed at this time.      Please follow-up with your primary care physician for your other      healthcare needs. __Please call [phone] to schedule a return visit to review      your situation.  __Please keep your follow-up visit as already scheduled.  _x_Continue treatment plan as outlined the day of your exam.  Please call us if you are having persistent problems or have questions about your condition that have not been fully answered at this time.  Sincerely,  Louis Meckel MD This letter has been electronically signed by your physician.  Appended Document: Patient Notice-Hyperplastic Polyps letter mailed

## 2010-02-07 NOTE — Assessment & Plan Note (Signed)
Summary: REFERRAL TO DENTAL CLINIC/[facility]   Vital Signs:  Patient profile:   55 year old female Height:      64.5 inches Weight:      210 pounds BMI:     35.62 Temp:     98.6 degrees F oral Pulse rate:   65 / minute BP sitting:   125 / 78  (right arm) Cuff size:   regular  Vitals Entered By: Tessie Fass CMA (August 02, 2009 3:44 PM) CC: dental referral Is Patient Diabetic? No Pain Assessment Patient in pain? yes     Location: dental Intensity: 8   Primary Care Provider:  Jamie Brookes MD  CC:  dental referral.  History of Present Illness: Dental pain: Pt has dental pain of 9/10. It all started about 1 week ago when one of her lower incisers broke off at the level of the gum. She has been taking Tylenol for the pain but it does not make it go away. She has not had any fevers or chills. She is having some Rt lower jaw pain  Habits & Providers  Alcohol-Tobacco-Diet     Tobacco Status: quit  Current Medications (verified): 1)  Aspirin Childrens 81 Mg Chew (Aspirin) .... Take 1 Tablet By Mouth Once A Day 2)  Hydrochlorothiazide 25 Mg Tabs (Hydrochlorothiazide) .Marland Kitchen.. 1 Tab By Mouth Daily 3)  Ventolin Hfa 108 (90 Base) Mcg/act Aers (Albuterol Sulfate) .... Inhale 2 Puff As Directed Every Four Hours 4)  Sertraline Hcl 100 Mg Tabs (Sertraline Hcl) .Marland Kitchen.. 1 Tab By Mouth At Bedtime 5)  Amlodipine Besylate 10 Mg Tabs (Amlodipine Besylate) .Marland Kitchen.. 1 Tab By Mouth Daily 6)  Carvedilol 3.125 Mg  Tabs (Carvedilol) .... One Tablet Twice A Day For Blood Pressure 7)  Ambien 10 Mg  Tabs (Zolpidem Tartrate) .... Take One Tablet At Bedtime For Insomnia 8)  Alprazolam 0.5 Mg  Tabs (Alprazolam) .Marland Kitchen.. 1 Tab By Mouth Two Times A Day As Needed Anxity 9)  Meclizine Hcl 25 Mg Tabs (Meclizine Hcl) .... Take One Tab By Mouth Two Times A Day For Dizziness 10)  Tramadol Hcl 50 Mg Tabs (Tramadol Hcl) .... Take One Pill Every 6 Hours 11)  Carvedilol 6.25 Mg Tabs (Carvedilol) .... Take One By Mouth Twice Daily  For Blood Pressure. 12)  Cyclobenzaprine Hcl 10 Mg Tabs (Cyclobenzaprine Hcl) .... Take 1 Tab At Night 13)  Vicodin 5-500 Mg Tabs (Hydrocodone-Acetaminophen) .... Take 1 Pill Up To Every 6 Hours As Needed For Tooth Pain. 14)  Avelox 400 Mg Tabs (Moxifloxacin Hcl) .Marland Kitchen.. 1 Pill Every 24 Hours For 10 Days If You Develop Fevers, Chills, Redness or Pus in The Broken Tooth  Allergies (verified): No Known Drug Allergies  Social History: Smoking Status:  quit  Review of Systems        vitals reviewed and pertinent negatives and positives seen in HPI   Physical Exam  General:  obese, in no acute distress; alert,appropriate and cooperative throughout examination Mouth:  upper teeth are dentures, lower teeth most are missing and now she has a rt sided incisor that is broken off at the level of the gum, no erythema or pus.    Impression & Recommendations:  Problem # 1:  BROKEN TOOTH (DGU-440.34) Assessment New Pt has tooth pain b/c of a broken lower rt incisor. She has some other gum disease seen on the other teeth she has remaining. She is taking Tylenol. part of the tooth is down in the gumline. Plan to get a dental  referral and I think she needs the rest of her teeth pulled and dentures made.   Orders: Dental Referral (Dentist) [facility]- Est Level  3 ([address])  Problem # 2:  SPECIAL SCREENING FOR MALIGNANT NEOPLASMS COLON (ICD-V76.51) Assessment: Comment Only Pt never got the colonoscopy that I ordered last time. Reordered referral today.  Orders: Gastroenterology Referral (GI)  Complete Medication List: 1)  Aspirin Childrens 81 Mg Chew (Aspirin) .... Take 1 tablet by mouth once a day 2)  Hydrochlorothiazide 25 Mg Tabs (Hydrochlorothiazide) .Marland Kitchen.. 1 tab by mouth daily 3)  Ventolin Hfa 108 (90 Base) Mcg/act Aers (Albuterol sulfate) .... Inhale 2 puff as directed every four hours 4)  Sertraline Hcl 100 Mg Tabs (Sertraline hcl) .Marland Kitchen.. 1 tab by mouth at bedtime 5)  Amlodipine Besylate 10 Mg Tabs  (Amlodipine besylate) .Marland Kitchen.. 1 tab by mouth daily 6)  Carvedilol 3.125 Mg Tabs (Carvedilol) .... One tablet twice a day for blood pressure 7)  Ambien 10 Mg Tabs (Zolpidem tartrate) .... Take one tablet at bedtime for insomnia 8)  Alprazolam 0.5 Mg Tabs (Alprazolam) .Marland Kitchen.. 1 tab by mouth two times a day as needed anxity 9)  Meclizine Hcl 25 Mg Tabs (Meclizine hcl) .... Take one tab by mouth two times a day for dizziness 10)  Tramadol Hcl 50 Mg Tabs (Tramadol hcl) .... Take one pill every 6 hours 11)  Carvedilol 6.25 Mg Tabs (Carvedilol) .... Take one by mouth twice daily for blood pressure. 12)  Cyclobenzaprine Hcl 10 Mg Tabs (Cyclobenzaprine hcl) .... Take 1 tab at night 13)  Vicodin 5-500 Mg Tabs (Hydrocodone-acetaminophen) .... Take 1 pill up to every 6 hours as needed for tooth pain. 14)  Avelox 400 Mg Tabs (Moxifloxacin hcl) .Marland Kitchen.. 1 pill every 24 hours for 10 days if you develop fevers, chills, redness or pus in the broken tooth  Patient Instructions: 1)  I have sent in 2 referrals and reminded our staff about the other PT referral.  2)  Take the Vicodin for tooth pain mostly in the evenings as this will make you sleepy.  3)  Drink lots of water as it may make you constipated.  4)  Call back for any signs of infection including fevers, chills, reddness or pus coming out of the tooth.  Prescriptions: AVELOX 400 MG TABS (MOXIFLOXACIN HCL) 1 pill every 24 hours for 10 days if you develop fevers, chills, redness or pus in the broken tooth  #10 x 0   Entered and Authorized by:   Jamie Brookes MD   Signed by:   Jamie Brookes MD on 08/02/2009   Method used:   Historical   RxID:   [id number] VICODIN 5-500 MG TABS (HYDROCODONE-ACETAMINOPHEN) take 1 pill up to every 6 hours as needed for tooth pain.  #25 x 0   Entered and Authorized by:   Jamie Brookes MD   Signed by:   Jamie Brookes MD on 08/02/2009   Method used:   Historical   RxID:   [id number]

## 2010-02-07 NOTE — Progress Notes (Signed)
Summary: refill of Sertraline 100 mg.   Phone Note Call from Patient Call back at [phone]   Caller: Patient Summary of Call: pt states that she has been out of her meds for 3 days - needs a refill today.  pharm just sent in today Initial call taken by: De Nurse,  April 22, 2009 1:32 PM  Follow-up for Phone Call        refilled amlodipine and Hctz . paged MD about Sertraline. she will refill this. Follow-up by: Theresia Lo RN,  April 22, 2009 2:07 PM    Prescriptions: SERTRALINE HCL 100 MG TABS (SERTRALINE HCL) 1 Tab by mouth at bedtime  #34 x 11   Entered and Authorized by:   Jamie Brookes MD   Signed by:   Jamie Brookes MD on 04/22/2009   Method used:   Electronically to        [facility] (retail)       [address]       [place], [place]  [id number]       Ph: [phone]       Fax: [phone]   RxID:   [id number]   Appended Document: refill of Sertraline 100 mg.  patient notified.

## 2010-02-07 NOTE — Miscellaneous (Signed)
Summary: [facility]  Clinical Lists Changes  Medications: Added new medication of MOVIPREP 100 GM  SOLR (PEG-KCL-NACL-NASULF-NA ASC-C) As per prep instructions. - Signed Rx of MOVIPREP 100 GM  SOLR (PEG-KCL-NACL-NASULF-NA ASC-C) As per prep instructions.;  #1 x 0;  Signed;  Entered by: Ezra Sites RN;  Authorized by: Louis Meckel MD;  Method used: Electronically to [facility] [address], [place], [place]  [id number], Ph: [phone], Fax: [phone] Observations: Added new observation of NKA: T (08/11/2009 13:29)    Prescriptions: MOVIPREP 100 GM  SOLR (PEG-KCL-NACL-NASULF-NA ASC-C) As per prep instructions.  #1 x 0   Entered by:   Ezra Sites RN   Authorized by:   Louis Meckel MD   Signed by:   Ezra Sites RN on 08/11/2009   Method used:   Electronically to        [facility] (retail)       [address]       [place], [place]  [id number]       Ph: [phone]       Fax: [phone]   RxID:   [id number]

## 2010-02-07 NOTE — Assessment & Plan Note (Signed)
Summary: HTN, asthma, BPV, insomnia,     Vital Signs:  Patient profile:   55 year old female Weight:      219 pounds BMI:     37.14 Temp:     98.1 degrees F oral Pulse rate:   77 / minute Pulse rhythm:   regular BP sitting:   168 / 97  (left arm) Cuff size:   regular  Vitals Entered By: Loralee Pacas CMA (November 22, 2009 6:05 PM) CC: HTN, Asthma, Dizziness, Insomnia   Primary Care Provider:  Jamie Brookes MD  CC:  HTN, Asthma, Dizziness, and Insomnia.  History of Present Illness: hypertension: Pt is on several medications for BP and comes in today with elevated BPof 168/97. She needs refills on some of her medications and tells me that she has not been taking all the meds as presribd. She has not been taking Carvedilol two times a day only once a day. So side effects noted (blurry vision, headaches, etc).   Asthma, Patient has asthma and has been recently Albuterol about 2 times a day for wheezing. She would like a refill on this medications. This is new for her. Her asthma is usually better controlled.   Dizziness; Pt is still using Meclizine occasionally for dizziness. She says that it helps a lot when she needs it. She is not using it everyday but would like to have a refill so that when she does get dizzy she has some available.   Insomnia: Pt has tried Melatonin and Trazadone in the past for her insomnia. She says that the Ambien has worked for her in the past. She would like to use it for her insomnia.     Habits & Providers  Alcohol-Tobacco-Diet     Tobacco Status: never  Current Medications (verified): 1)  Aspirin Childrens 81 Mg Chew (Aspirin) .... Take 1 Tablet By Mouth Once A Day 2)  Hydrochlorothiazide 25 Mg Tabs (Hydrochlorothiazide) .Marland Kitchen.. 1 Tab By Mouth Daily 3)  Ventolin Hfa 108 (90 Base) Mcg/act Aers (Albuterol Sulfate) .... Inhale 2 Puff As Directed Every Four Hours 4)  Sertraline Hcl 100 Mg Tabs (Sertraline Hcl) .Marland Kitchen.. 1 Tab By Mouth At Bedtime 5)   Amlodipine Besylate 10 Mg Tabs (Amlodipine Besylate) .Marland Kitchen.. 1 Tab By Mouth Daily 6)  Ambien 10 Mg  Tabs (Zolpidem Tartrate) .... Take One Tablet At Bedtime For Insomnia 7)  Alprazolam 0.5 Mg  Tabs (Alprazolam) .Marland Kitchen.. 1 Tab By Mouth Two Times A Day As Needed Anxity 8)  Meclizine Hcl 25 Mg Tabs (Meclizine Hcl) .... Take One Tab By Mouth Two Times A Day For Dizziness 9)  Tramadol Hcl 50 Mg Tabs (Tramadol Hcl) .... Take One Pill Every 6 Hours 10)  Carvedilol 12.5 Mg Tabs (Carvedilol) .... Take 1 Pill in The Morning and 1 Pill in The Evening For High Blood Pressure 11)  Cyclobenzaprine Hcl 10 Mg Tabs (Cyclobenzaprine Hcl) .... Take 1 Tab At Night  Allergies (verified): No Known Drug Allergies  Social History: Smoking Status:  never  Review of Systems        vitals reviewed and pertinent negatives and positives seen in HPI   Physical Exam  General:  Well-developed,well-nourished,in no acute distress; alert,appropriate and cooperative throughout examination Lungs:  Normal respiratory effort, chest expands symmetrically. Lungs are clear to auscultation, no crackles or wheezes. Heart:  slight systolic murmur Psych:  depressed affect but interactive.      Impression & Recommendations:  Problem # 1:  HYPERTENSION, BENIGN SYSTEMIC (ICD-401.1) Assessment  Deteriorated Pt is not taking her BP meds correctly. Plan to treat with current meds, refill the ones that need refilling, and encouraged pt to take as directed.   The following medications were removed from the medication list:    Carvedilol 3.125 Mg Tabs (Carvedilol) ..... One tablet twice a day for blood pressure Her updated medication list for this problem includes:    Hydrochlorothiazide 25 Mg Tabs (Hydrochlorothiazide) .Marland Kitchen... 1 tab by mouth daily    Amlodipine Besylate 10 Mg Tabs (Amlodipine besylate) .Marland Kitchen... 1 tab by mouth daily    Carvedilol 12.5 Mg Tabs (Carvedilol) .Marland Kitchen... Take 1 pill in the morning and 1 pill in the evening for high blood  pressure  Orders: [facility]- Est  Level 4 (09811)  Problem # 2:  ASTHMA, UNSPECIFIED (ICD-493.90) Assessment: Deteriorated Pt's asthma is intermittant but when she has symptoms the albuterol works well for her. She has been using it more often lately. Plan to treat with Albuterol.   Her updated medication list for this problem includes:    Ventolin Hfa 108 (90 Base) Mcg/act Aers (Albuterol sulfate) ..... Inhale 2 puff as directed every four hours  Orders: [facility]- Est  Level 4 (91478)  Problem # 3:  BENIGN POSITIONAL VERTIGO (ICD-386.11) Assessment: Unchanged Pt is doing well on her Meclizine and only uses it when she needs it. Plan to refill today.   Her updated medication list for this problem includes:    Meclizine Hcl 25 Mg Tabs (Meclizine hcl) .Marland Kitchen... Take one tab by mouth two times a day for dizziness  Orders: [facility]- Est  Level 4 (29562)  Problem # 4:  INSOMNIA NOS (ICD-780.52) Assessment: Deteriorated Pt is raising her grandchildren since her daugther died. The anniversary of her death was in 11/08/22 and Ms. Calame is not sleeping well. Plan to treat insomnia with Ambien. She will continue on her antidepressents.   Her updated medication list for this problem includes:    Ambien 10 Mg Tabs (Zolpidem tartrate) .Marland Kitchen... Take one tablet at bedtime for insomnia  Orders: [facility]- Est  Level 4 (99214) Pt given flu shot today.   Complete Medication List: 1)  Aspirin Childrens 81 Mg Chew (Aspirin) .... Take 1 tablet by mouth once a day 2)  Hydrochlorothiazide 25 Mg Tabs (Hydrochlorothiazide) .Marland Kitchen.. 1 tab by mouth daily 3)  Ventolin Hfa 108 (90 Base) Mcg/act Aers (Albuterol sulfate) .... Inhale 2 puff as directed every four hours 4)  Sertraline Hcl 100 Mg Tabs (Sertraline hcl) .Marland Kitchen.. 1 tab by mouth at bedtime 5)  Amlodipine Besylate 10 Mg Tabs (Amlodipine besylate) .Marland Kitchen.. 1 tab by mouth daily 6)  Ambien 10 Mg Tabs (Zolpidem tartrate) .... Take one tablet at bedtime for insomnia 7)  Alprazolam 0.5 Mg  Tabs (Alprazolam) .Marland Kitchen.. 1 tab by mouth two times a day as needed anxity 8)  Meclizine Hcl 25 Mg Tabs (Meclizine hcl) .... Take one tab by mouth two times a day for dizziness 9)  Tramadol Hcl 50 Mg Tabs (Tramadol hcl) .... Take one pill every 6 hours 10)  Carvedilol 12.5 Mg Tabs (Carvedilol) .... Take 1 pill in the morning and 1 pill in the evening for high blood pressure 11)  Cyclobenzaprine Hcl 10 Mg Tabs (Cyclobenzaprine hcl) .... Take 1 tab at night  Other Orders: Influenza Vaccine NON MCR (13086)  Patient Instructions: 1)  You are in the prediabetes range for your blood sugar. Exercise regularily (30-60 min a day) and eat healthfully (lots of vegetables).  Prescriptions: MECLIZINE HCL 25 MG  TABS (MECLIZINE HCL) take one tab by mouth two times a day for dizziness  #62 x 3   Entered and Authorized by:   Jamie Brookes MD   Signed by:   Jamie Brookes MD on 11/22/2009   Method used:   Electronically to        [facility] (retail)       [address]       [place], [place]  [id number]       Ph: [phone]       Fax: [phone]   RxID:   [id number] VENTOLIN HFA 108 (90 BASE) MCG/ACT AERS (ALBUTEROL SULFATE) Inhale 2 puff as directed every four hours  #1 x 6   Entered and Authorized by:   Jamie Brookes MD   Signed by:   Jamie Brookes MD on 11/22/2009   Method used:   Electronically to        [facility] (retail)       [address]       [place], [place]  [id number]       Ph: [phone]       Fax: [phone]   RxID:   [id number] CARVEDILOL 12.5 MG TABS (CARVEDILOL) take 1 pill in the morning and 1 pill in the evening for high blood pressure  #60 x 6   Entered and Authorized by:   Jamie Brookes MD   Signed by:   Jamie Brookes MD on 11/22/2009   Method used:   Electronically to        [facility] (retail)       [address]       [place], [place]  [id number]       Ph: [phone]        Fax: [phone]   RxID:   [id number] TRAMADOL HCL 50 MG TABS (TRAMADOL HCL) take one pill every 6 hours  #120 x 6   Entered and Authorized by:   Jamie Brookes MD   Signed by:   Jamie Brookes MD on 11/22/2009   Method used:   Electronically to        [facility] (retail)       [address]       [place], [place]  [id number]       Ph: [phone]       Fax: [phone]   RxID:   [id number] HYDROCHLOROTHIAZIDE 25 MG TABS (HYDROCHLOROTHIAZIDE) 1 tab by mouth daily  #90 x 3   Entered and Authorized by:   Jamie Brookes MD   Signed by:   Jamie Brookes MD on 11/22/2009   Method used:   Electronically to        [facility] (retail)       [address]       [place], [place]  [id number]       Ph: [phone]       Fax: [phone]   RxID:   [id number] AMLODIPINE BESYLATE 10 MG TABS (AMLODIPINE BESYLATE) 1 tab by mouth daily  #90 x 3   Entered and Authorized by:   Jamie Brookes MD   Signed by:   Jamie Brookes MD on 11/22/2009   Method used:   Electronically to        [facility] (retail)       [address]       [place], [place]  [id number]  Ph: [phone]       Fax: [phone]   RxID:   [id number] AMBIEN 10 MG  TABS (ZOLPIDEM TARTRATE) Take one tablet at bedtime for insomnia  #31 x 3   Entered and Authorized by:   Jamie Brookes MD   Signed by:   Jamie Brookes MD on 11/22/2009   Method used:   Handwritten   RxID:   [id number]    Orders Added: 1)  Influenza Vaccine NON MCR [00028] 2)  [facility]- Est  Level 4 [72536]   Immunizations Administered:  Influenza Vaccine # 1:    Vaccine Type: Fluvax Non-MCR    Site: left deltoid    Mfr: GlaxoSmithKline    Dose: 0.5 ml    Route: IM    Given by: Loralee Pacas CMA    Exp. Date: 07/08/2010    Lot #: [id number]    VIS given: 08/02/09 version given November 22, 2009.  Flu Vaccine Consent Questions:    Do you have a  history of severe allergic reactions to this vaccine? no    Any prior history of allergic reactions to egg and/or gelatin? no    Do you have a sensitivity to the preservative Thimersol? no    Do you have a past history of Guillan-Barre Syndrome? no    Do you currently have an acute febrile illness? no    Have you ever had a severe reaction to latex? no    Vaccine information given and explained to patient? yes    Are you currently pregnant? no   Immunizations Administered:  Influenza Vaccine # 1:    Vaccine Type: Fluvax Non-MCR    Site: left deltoid    Mfr: GlaxoSmithKline    Dose: 0.5 ml    Route: IM    Given by: Loralee Pacas CMA    Exp. Date: 07/08/2010    Lot #: [id number]    VIS given: 08/02/09 version given November 22, 2009.

## 2010-02-07 NOTE — Progress Notes (Signed)
Summary: Stacy Campbell  Phone Note Call from Patient Call back at [facility] Phone [phone]   Caller: Patient Summary of Call: Pt checking on status of being referred to a dentist. Initial call taken by: Clydell Hakim,  August 10, 2009 2:12 PM  Follow-up for Phone Call        patient notified that referral was sent on 08/03/2009. explained that the Clinic has been without a dentist for a few weeks and they are backed up but hopefully she should hear from them soon.  called and left message for dental referral coordinator to call back with status of appointment. Follow-up by: Theresia Lo RN,  August 10, 2009 3:56 PM

## 2010-02-07 NOTE — Progress Notes (Signed)
Summary: Cancel [facility]  Phone Note Call from Patient   Caller: Patient Call For: Spero Geralds, Psy.D. Summary of Call: Patient called at 9:00 to cancel her appt this afternoon.  She has a viral illness.  She said she would call back to reschedule when feeling better. Initial call taken by: Spero Geralds PsyD,  May 24, 2009 8:54 AM

## 2010-02-07 NOTE — Progress Notes (Signed)
Summary: phn msg  Phone Note Call from Patient Call back at Home Phone [phone]   Caller: Patient Summary of Call: needs for [place] to send another referral to [facility] - they have told pt they never rec'd anything  Initial call taken by: De Nurse,  September 08, 2009 3:43 PM  Follow-up for Phone Call        faxed back over to [facility] Follow-up by: Jimmy Footman, CMA,  September 09, 2009 9:11 AM

## 2010-02-07 NOTE — Letter (Signed)
Summary: New Patient letter  [facility]  [address] [place], [place] [address]   Phone: [phone]  Fax: [phone]       01/10/2009 MRN: [id number]  [name] [address] [place], [place]  [address]  Dear Ms. Soliman,  Welcome to the Gastroenterology Division at [facility].    You are scheduled to see Dr. Jarold Motto on 1-21-11at 10:00a.m. on the 3rd floor at [facility], [address]  We ask that you try to arrive at our office 15 minutes prior to your appointment time to allow for check-in.  We would like you to complete the enclosed self-administered evaluation form prior to your visit and bring it with you on the day of your appointment.  We will review it with you.  Also, please bring a complete list of all your medications or, if you prefer, bring the medication bottles and we will list them.  Please bring your insurance card so that we may make a copy of it.  If your insurance requires a referral to see a specialist, please bring your referral form from your primary care physician.  Co-payments are due at the time of your visit and may be paid by cash, check or credit card.     Your office visit will consist of a consult with your physician (includes a physical exam), any laboratory testing he/she may order, scheduling of any necessary diagnostic testing (e.g. x-ray, ultrasound, CT-scan), and scheduling of a procedure (e.g. Endoscopy, Colonoscopy) if required.  Please allow enough time on your schedule to allow for any/all of these possibilities.    If you cannot keep your appointment, please call [phone] to cancel or reschedule prior to your appointment date.  This allows [place] the opportunity to schedule an appointment for another patient in need of care.  If you do not cancel or reschedule by 5 p.m. the business day prior to your appointment date, you will be charged a $50.00 late cancellation/no-show fee.    Thank you for  choosing [facility] for your medical needs.  We appreciate the opportunity to care for you.  Please visit [place] at our website  to learn more about our practice.                     Sincerely,                                                             The Gastroenterology Division

## 2010-02-07 NOTE — Assessment & Plan Note (Signed)
Summary: Leg cramps, back pain, ezcema, health maintance.    Vital Signs:  Patient profile:   55 year old female Height:      64.5 inches Weight:      202.8 pounds BMI:     34.40 Temp:     98.9 degrees F oral Pulse rate:   80 / minute BP sitting:   127 / 83  (left arm) Cuff size:   regular  Vitals Entered By: Gladstone Pih (January 10, 2009 8:39 AM) CC: leg cramps, back pain, health maintenance Is Patient Diabetic? No Pain Assessment Patient in pain? yes     Location: lower back Intensity: 8 Type: sharp   Primary Care Provider:  Jamie Brookes MD  CC:  leg cramps, back pain, and health maintenance.  History of Present Illness: Leg cramps: Pt has been having some leg cramps at night. She says that she has to get up at night and walk around to make it stop. She has been told by another doctor to use tonic water to get rid of the cramps. She says it helps some.   Back pain: Pt has a h/o back pain that has been treated with ibuprofen, tylenol and Meloxicam. She says these things have not helped and at time she can hardly deal with the pain. She has a h/o arthritis seen on x-ray.   Ezcema: Pt has a patch of ezcema on her lower left leg. She is using Vasoline and says that it helps but that she has struggled with ezcema her whole life.   Health Maintance: Pt is in need of some vaccines (Flu, Pneumovax, Tetanus) and needs a colonoscopy and mammogram. She will get assistance with all of these today.    Habits & Providers  Alcohol-Tobacco-Diet     Tobacco Status: never  Current Medications (verified): 1)  Aspirin Childrens 81 Mg Chew (Aspirin) .... Take 1 Tablet By Mouth Once A Day 2)  Hydrochlorothiazide 25 Mg Tabs (Hydrochlorothiazide) .Marland Kitchen.. 1 Tab By Mouth Daily 3)  Ventolin Hfa 108 (90 Base) Mcg/act Aers (Albuterol Sulfate) .... Inhale 2 Puff As Directed Every Four Hours 4)  Sertraline Hcl 100 Mg Tabs (Sertraline Hcl) .Marland Kitchen.. 1 Tab By Mouth At Bedtime 5)  Amlodipine  Besylate 10 Mg Tabs (Amlodipine Besylate) .Marland Kitchen.. 1 Tab By Mouth Daily 6)  Carvedilol 3.125 Mg  Tabs (Carvedilol) .... One Tablet Twice A Day For Blood Pressure 7)  Ambien 10 Mg  Tabs (Zolpidem Tartrate) .... Take One Tablet At Bedtime For Insomnia 8)  Melatonin 5 Mg  Tabs (Melatonin) .... One Tablet At 6pm Nightly For Two Weeks 9)  Alprazolam 0.5 Mg  Tabs (Alprazolam) .Marland Kitchen.. 1 Tab By Mouth Two Times A Day As Needed Anxity 10)  Meclizine Hcl 25 Mg Tabs (Meclizine Hcl) .... Take One Tab By Mouth Two Times A Day For Dizziness 11)  Tramadol Hcl 50 Mg Tabs (Tramadol Hcl) .... 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   Review of Systems        vitals reviewed and pertinent negatives and positives seen in HPI   Physical Exam  General:  Well-developed,well-nourished,in no acute distress; alert,appropriate and cooperative throughout examination Breasts:  No mass, nodules, thickening, tenderness, bulging, retraction, inflamation, nipple discharge or  skin changes noted.   Lungs:  Normal respiratory effort, chest expands symmetrically. Lungs are clear to auscultation, no crackles or wheezes. Heart:  mid systolic murmur heard loudest at the Rt 2nd ICS.  Genitalia:  Normal introitus for age, no external lesions, no vaginal discharge, mucosa pink and moist, no vaginal or cervical lesions, no vaginal atrophy, no friaility or hemorrhage, normal uterus size and position, no adnexal masses or tenderness Skin:  ezcema on left lower leg  Psych:  depressed affect. good eye contact   Impression & Recommendations:  Problem # 1:  LEG CRAMPS, NOCTURNAL (ICD-729.82) Assessment New Pt says she is having leg cramps and using tonic water  for it. I suggested making sure that she gets plenty of fresh fruits and vegetables to increase her potassium and magnesium in her diet. May consider and treat this as restless leg sydrome if symptoms persist.   Orders: [facility]- Est  Level 4 (78469)  Problem # 2:  BACK PAIN, LOW (ICD-724.2) Assessment: Deteriorated PT says that Mobic is not helping. Says back pain is hindering her movement. Will try Tramadol.   The following medications were removed from the medication list:    Meloxicam 7.5 Mg Tabs (Meloxicam) .Marland Kitchen... 1 tab by mouth daily Her updated medication list for this problem includes:    Aspirin Childrens 81 Mg Chew (Aspirin) .Marland Kitchen... Take 1 tablet by mouth once a day    Tramadol Hcl 50 Mg Tabs (Tramadol hcl) .Marland Kitchen... Take one pill every 6 hours  Orders: [facility]- Est  Level 4 (62952)  Problem # 3:  ECZEMA (ICD-692.9) Assessment: Deteriorated  Pt has Left lower leg ezcema that has reecently gotten worse. She says she is putting cream on it and is not concerned.   Problem # 4:  Preventive Health Care (ICD-V70.0) Assessment: Comment Only PT had a pap smear today. If this one is normal she will not need another pap. She says she has not had any abnormal pap smears.   Complete Medication List: 1)  Aspirin Childrens 81 Mg Chew (Aspirin) .... Take 1 tablet by mouth once a day 2)  Hydrochlorothiazide 25 Mg Tabs (Hydrochlorothiazide) .Marland Kitchen.. 1 tab by mouth daily 3)  Ventolin Hfa 108 (90 Base) Mcg/act Aers (Albuterol sulfate) .... Inhale 2 puff as directed every four hours 4)  Sertraline Hcl 100 Mg Tabs (Sertraline hcl) .Marland Kitchen.. 1 tab by mouth at bedtime 5)  Amlodipine Besylate 10 Mg Tabs (Amlodipine besylate) .Marland Kitchen.. 1 tab by mouth daily 6)  Carvedilol 3.125 Mg Tabs (Carvedilol) .... One tablet twice a day for blood pressure 7)  Ambien 10 Mg Tabs (Zolpidem tartrate) .... Take one tablet at bedtime for insomnia 8)  Melatonin 5 Mg Tabs (Melatonin) .... One tablet at 6pm nightly for two weeks 9)  Alprazolam  0.5 Mg Tabs (Alprazolam) .Marland Kitchen.. 1 tab by mouth two times a day as needed anxity 10)  Meclizine Hcl 25 Mg Tabs (Meclizine hcl) .... Take one tab by mouth two times a day for dizziness 11)  Tramadol Hcl 50 Mg Tabs (Tramadol hcl) .... Take one pill every 6 hours  Other Orders: Colonoscopy (Colon) Pap Smear-[facility] ([address]-44010) Mammogram (Screening) (Mammo) Flu Vaccine 60yrs + ([address]) Admin 1st Vaccine (66440) Tdap => 39yrs IM (34742) Pneumococcal Vaccine (59563) Admin of Any Addtl Vaccine (87564) Admin of Any Addtl Vaccine (33295) Gastroenterology Referral (GI)  Patient Instructions: 1)  We did a pap smear today. I will call you if there are abnormal results.  2)  You will get a Flu  shot, Pneumovax and Tetanus shot as well as info to set up  Mammogram today. 3)  You will also get a call about setting up colonoscopy soon.  4)  Try the Tramadol for the back pain. It may help with the leg cramps as well. Also eat plenty of fresh fruits and vegetables to help with the leg cramps.  5)  Keep up the good work with your blood pressure.  Prescriptions: TRAMADOL HCL 50 MG TABS (TRAMADOL HCL) take one pill every 6 hours  #120 x 3   Entered and Authorized by:   Jamie Brookes MD   Signed by:   Jamie Brookes MD on 01/10/2009   Method used:   Electronically to        [facility] (retail)       [address]       [place], [place]  [id number]       Ph: [phone]       Fax: [phone]   RxID:   [id number]    Prevention & Chronic Care Immunizations   Influenza vaccine: Fluvax 3+  (01/10/2009)   Influenza vaccine due: 10/12/2008    Tetanus booster: 01/10/2009: Tdap   Tetanus booster due: 01/08/2009    Pneumococcal vaccine: Pneumovax  (01/10/2009)  Colorectal Screening   Hemoccult: Not documented   Hemoccult action/deferral: Not indicated  (01/10/2009)    Colonoscopy: Not documented   Colonoscopy action/deferral: GI Referral  (01/10/2009)  Other Screening    Pap smear: normal  (10/09/2004)   Pap smear action/deferral: Ordered  (01/10/2009)   Pap smear due: 10/2007    Mammogram: Done.  (01/08/2006)   Mammogram action/deferral: Ordered  (01/10/2009)   Mammogram due: 01/09/2007   Smoking status: never  (01/10/2009)  Lipids   Total Cholesterol: 138  (02/28/2007)   LDL: 80  (02/28/2007)   LDL Direct: Not documented   HDL: 31  (02/28/2007)   Triglycerides: 133  (02/28/2007)  Hypertension   Last Blood Pressure: 127 / 83  (01/10/2009)   Serum creatinine: 0.70  (06/24/2008)   Serum potassium 3.9  (06/24/2008)    Hypertension flowsheet reviewed?: Yes   Progress toward BP goal: At goal  Self-Management Support :   Personal Goals (by the next clinic visit) :      Personal blood pressure goal: 130/80  (11/24/2008)   Patient will work on the following items until the next clinic visit to reach self-care goals:     Medications and monitoring: take my medicines every day, bring all of my medications to every visit  (01/10/2009)     Eating: eat more vegetables, use fresh or frozen vegetables, eat foods that are low in salt  (01/10/2009)     Activity: take a 30 minute walk every day, take the stairs instead of the elevator, park at the far end of the parking lot, join a walking program  (01/10/2009)    Hypertension self-management support: Written self-care plan  (01/10/2009)   Hypertension self-care plan printed.   Nursing Instructions: Give Flu vaccine today Give tetanus booster today Give Pneumovax today Screening colonoscopy ordered Pap smear today Schedule screening mammogram (see order)     Immunizations Administered:  Influenza Vaccine # 1:    Vaccine Type: Fluvax 3+    Site: left deltoid    Mfr: GlaxoSmithKline    Dose: 0.5 ml    Route: IM    Given by: Gladstone Pih    Exp. Date: 07/07/2009    Lot #: [id number]  VIS given: 08/01/06 version given January 10, 2009.  Tetanus Vaccine:    Vaccine Type: Tdap    Site:  right deltoid    Mfr: GlaxoSmithKline    Dose: 0.5 ml    Route: IM    Given by: Gladstone Pih    Exp. Date: 03/05/2011    Lot #: [id number]    VIS given: 11/26/06 version given January 10, 2009.  Pneumonia Vaccine:    Vaccine Type: Pneumovax    Site: left deltoid    Mfr: Merck    Dose: 0.5 ml    Route: IM    Given by: Gladstone Pih    Exp. Date: 12/18/2009    Lot #: [id number]    VIS given: 08/06/95 version given January 10, 2009.  Flu Vaccine Consent Questions:    Do you have a history of severe allergic reactions to this vaccine? no    Any prior history of allergic reactions to egg and/or gelatin? no    Do you have a sensitivity to the preservative Thimersol? no    Do you have a past history of Guillan-Barre Syndrome? no    Do you currently have an acute febrile illness? no    Have you ever had a severe reaction to latex? no    Vaccine information given and explained to patient? yes    Are you currently pregnant? no    Appended Document: Leg cramps, back pain, ezcema, health maintance.  this patient did not show for her GI appointment and will be billed at her primary care physician will be noted.

## 2010-02-07 NOTE — Procedures (Signed)
Summary: Colonoscopy  Patient: Stacy Campbell Note: All result statuses are Final unless otherwise noted.  Tests: (1) Colonoscopy (COL)   COL Colonoscopy           DONE     [facility]     [address]     [place], [place]  [address]           COLONOSCOPY PROCEDURE REPORT           PATIENT:  Stacy, Campbell  MR#:  [id number]     BIRTHDATE:  Feb 28, 1955, 54 yrs. old  GENDER:  female           ENDOSCOPIST:  Stacy Campbell. Arlyce Dice, MD     Referred by:           PROCEDURE DATE:  08/25/2009     PROCEDURE:  Colonoscopy with snare polypectomy     ASA CLASS:  Class II     INDICATIONS:  1) Routine Risk Screening           MEDICATIONS:   Fentanyl 100 mcg IV, Versed 10 mg IV, Benadryl 50     mg IV           DESCRIPTION OF PROCEDURE:   After the risks benefits and     alternatives of the procedure were thoroughly explained, informed     consent was obtained.  Digital rectal exam was performed and     revealed no abnormalities.   The LB [id number] endoscope     was introduced through the anus and advanced to the cecum, which     was identified by the ileocecal valve, without limitations.  The     quality of the prep was good, using MoviPrep.  The instrument was     then slowly withdrawn as the colon was fully examined.     <<PROCEDUREIMAGES>>           FINDINGS:  A sessile polyp was found in the sigmoid colon. It was     3 mm in size. It was found 16 cm from the point of entry. Polyp     was snared without cautery. Retrieval was successful (see image9).     snare polyp  This was otherwise a normal examination of the colon     (see image1, image3, image4, image6, image8, and image10).     Retroflexed views in the rectum revealed no abnormalities.    The     time to cecum =  8.0  minutes. The scope was then withdrawn (time     =  9.0  min) from the patient and the procedure completed.           COMPLICATIONS:  None           ENDOSCOPIC IMPRESSION:     1) 3 mm  sessile polyp in the sigmoid colon     2) Otherwise normal examination     RECOMMENDATIONS:     1) If the polyp(s) removed today are proven to be adenomatous     (pre-cancerous) polyps, you will need a repeat colonoscopy in 5     years. Otherwise you should continue to follow colorectal cancer     screening guidelines for "routine risk" patients with colonoscopy     in 10 years.           f/u colo with propofol           REPEAT EXAM:  You will receive a letter from Dr.  Kaplan in 1-2     weeks, after reviewing the final pathology, with followup     recommendations.           ______________________________     Stacy Campbell Arlyce Dice, MD           CC: Jamie Brookes MD           n.     Rosalie DoctorBarbette Campbell. Kaplan at 08/25/2009 09:29 AM           [name], [id number]  Note: An exclamation mark (!) indicates a result that was not dispersed into the flowsheet. Document Creation Date: 08/25/2009 9:29 AM _______________________________________________________________________  (1) Order result status: Final Collection or observation date-time: 08/25/2009 09:20 Requested date-time:  Receipt date-time:  Reported date-time:  Referring Physician:   Ordering Physician: Melvia Heaps [phone]) Specimen Source:  Source: Launa Grill Order Number: [id number] Lab site:   Appended Document: Colonoscopy     Procedures Next Due Date:    Colonoscopy: 08/2019

## 2010-02-07 NOTE — Letter (Signed)
Summary: PHQ-9   score 16  PHQ-   Imported By: Bradly Bienenstock 04/08/2009 15:52:49  _____________________________________________________________________  External Attachment:    Type:   Image     Comment:   External Document
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
## 2010-02-07 NOTE — Letter (Signed)
Summary: [facility] Instructions  [facility]  [address] [place], [place] [address]   Phone: [phone]  Fax: [phone]       Stacy Campbell    06/09/1955    MRN: [id number]        Procedure Day Dorna Bloom:  Lenor Coffin  08/25/09     Arrival Time:  8:00am     Procedure Time: 9:00am     Location of Procedure:                    Juliann Pares  [facility] (4th Floor) _                       PREPARATION FOR COLONOSCOPY WITH MOVIPREP   Starting 5 days prior to your procedure  SATURDAY 08/13  do not eat nuts, seeds, popcorn, corn, beans, peas,  salads, or any raw vegetables.  Do not take any fiber supplements (e.g. Metamucil, Citrucel, and Benefiber).  THE DAY BEFORE YOUR PROCEDURE         DATE:  [facility]  08/17  1.  Drink clear liquids the entire day-NO SOLID FOOD  2.  Do not drink anything colored red or purple.  Avoid juices with pulp.  No orange juice.  3.  Drink at least 64 oz. (8 glasses) of fluid/clear liquids during the day to prevent dehydration and help the prep work efficiently.  CLEAR LIQUIDS INCLUDE: Water Jello Ice Popsicles Tea (sugar ok, no milk/cream) Powdered fruit flavored drinks Coffee (sugar ok, no milk/cream) Gatorade Juice: apple, white grape, white cranberry  Lemonade Clear bullion, consomm, broth Carbonated beverages (any kind) Strained chicken noodle soup Hard Candy                             4.  In the morning, mix first dose of MoviPrep solution:    Empty 1 Pouch A and 1 Pouch B into the disposable container    Add lukewarm drinking water to the top line of the container. Mix to dissolve    Refrigerate (mixed solution should be used within 24 hrs)  5.  Begin drinking the prep at 5:00 p.m. The MoviPrep container is divided by 4 marks.   Every 15 minutes drink the solution down to the next mark (approximately 8 oz) until the full liter is complete.   6.  Follow completed prep with 16 oz of clear liquid of your  choice (Nothing red or purple).  Continue to drink clear liquids until bedtime.  7.  Before going to bed, mix second dose of MoviPrep solution:    Empty 1 Pouch A and 1 Pouch B into the disposable container    Add lukewarm drinking water to the top line of the container. Mix to dissolve    Refrigerate  THE DAY OF YOUR PROCEDURE      DATE:  THURSDAY  08/18  Beginning at  4:00 a.m. (5 hours before procedure):         1. Every 15 minutes, drink the solution down to the next mark (approx 8 oz) until the full liter is complete.  2. Follow completed prep with 16 oz. of clear liquid of your choice.    3. You may drink clear liquids until  7:00am  (2 HOURS BEFORE PROCEDURE).   MEDICATION INSTRUCTIONS  Unless otherwise instructed, you should take regular prescription medications with a small sip of water  as early as possible the morning of your procedure.    Additional medication instructions: Do not take HCTZ day of procedure.         OTHER INSTRUCTIONS  You will need a responsible adult at least 55 years of age to accompany you and drive you home.   This person must remain in the waiting room during your procedure.  Wear loose fitting clothing that is easily removed.  Leave jewelry and other valuables at home.  However, you may wish to bring a book to read or  an iPod/MP3 player to listen to music as you wait for your procedure to start.  Remove all body piercing jewelry and leave at home.  Total time from sign-in until discharge is approximately 2-3 hours.  You should go home directly after your procedure and rest.  You can resume normal activities the  day after your procedure.  The day of your procedure you should not:   Drive   Make legal decisions   Operate machinery   Drink alcohol   Return to work  You will receive specific instructions about eating, activities and medications before you leave.    The above instructions have been reviewed and  explained to me by   Ezra Sites RN  August 11, 2009 1:55 PM    I fully understand and can verbalize these instructions _____________________________ Date _________

## 2010-02-07 NOTE — Progress Notes (Signed)
Summary: re: dental referral  ---- Converted from flag ---- ---- 08/02/2009 5:31 PM, Jamie Brookes MD wrote: please upgrade this to an emergent dental referral. I gave her pain meds and antibiotics to use if she develops fevers or chills. ------------------------------ Faxed  urgent referral to [facility]..Stacy Campbell, CMA  August 03, 2009 9:39 AM

## 2010-02-07 NOTE — Progress Notes (Signed)
Summary: resch  Phone Note Call from Patient   Caller: Patient Summary of Call: car broke down and had to resch Initial call taken by: De Nurse,  June 27, 2009 8:32 AM  Follow-up for Phone Call        ok, will be happy to see her when her car is fixed. Follow-up by: Jamie Brookes MD,  June 27, 2009 1:51 PM

## 2010-02-07 NOTE — Progress Notes (Signed)
Summary: meds prob  Phone Note Call from Patient Call back at Home Phone [phone]   Caller: Patient Summary of Call: states that AVELOX 400 MG TABS did not get called in  [facility] Initial call taken by: De Nurse,  August 03, 2009 2:08 PM  Follow-up for Phone Call        to pcp before I send it in. it is marked historical in OV notes. ok to send? Follow-up by: Golden Circle RN,  August 03, 2009 2:09 PM  Additional Follow-up for Phone Call Additional follow up Details #1::        yes, sorry about that. Please do send it!  Additional Follow-up by: Jamie Brookes MD,  August 03, 2009 2:43 PM    Additional Follow-up for Phone Call Additional follow up Details #2::    Pt notified rx sent in. Follow-up by: Clydell Hakim,  August 04, 2009 8:50 AM  Prescriptions: AVELOX 400 MG TABS (MOXIFLOXACIN HCL) 1 pill every 24 hours for 10 days if you develop fevers, chills, redness or pus in the broken tooth  #10 x 0   Entered by:   Golden Circle RN   Authorized by:   Jamie Brookes MD   Signed by:   Clydell Hakim on 08/04/2009   Method used:   Electronically to        [facility] (retail)       [address]       [place], [place]  [id number]       Ph: [phone]       Fax: [phone]   RxID:   [id number]

## 2010-02-07 NOTE — Assessment & Plan Note (Signed)
Summary: Fatigue, Depression, Anxiety   Vital Signs:  Patient profile:   55 year old female Height:      64.5 inches Weight:      213.4 pounds BMI:     36.19 Temp:     98.2 degrees F oral Pulse rate:   86 / minute BP sitting:   127 / 83  (left arm) Cuff size:   large  Vitals Entered By: Gladstone Pih (April 06, 2009 1:34 PM) CC: Depression, anxiety Is Patient Diabetic? No Pain Assessment Patient in pain? no        Primary Care Provider:  Jamie Brookes MD  CC:  Depression and anxiety.  History of Present Illness: Fatigue: Pt has a new level of fatigue and says that she is just sitting in her chair doing nothing most of the time when she is at home. She is afebrile and says she is eating some but her appetite is not great. She is not taking her vitamins.   Depression: Pt has a PHQ9 score of 16 and says that it has made her life "somewhat difficult". Pt had a daughter die a little over a year ago and is now raising her grandchildren. They cause a new level of stress in her life as they go the grieving process as well. She is doing a good job making sure that they go to [employer] for counseling but has not pursued counseling for her self. She says she does tear up about things while at work but holds it together when she is there. When she is home she is fatigued and does not want to do anything. She does not find any pleasure in the things she use to do. She is taking her medications including Sertraline 100mg  daily but says she is still very irritable, wakes up early sometimes, and does not leave the house on weekends. She thinkgs the meds are not helping her. Her appetite is a little down but she says she finds herself nibbling on sweets.   Anxiety: Pt says that she is anxious quite a bit and does have to take the alprazolam but not every day. She says she is not anxious that anything bad will happen to the kids, but is very anxious that something bad will happen to her.   Habits  & Providers  Alcohol-Tobacco-Diet     Tobacco Status: never  Current Medications (verified): 1)  Aspirin Childrens 81 Mg Chew (Aspirin) .... Take 1 Tablet By Mouth Once A Day 2)  Hydrochlorothiazide 25 Mg Tabs (Hydrochlorothiazide) .Marland Kitchen.. 1 Tab By Mouth Daily 3)  Ventolin Hfa 108 (90 Base) Mcg/act Aers (Albuterol Sulfate) .... Inhale 2 Puff As Directed Every Four Hours 4)  Sertraline Hcl 100 Mg Tabs (Sertraline Hcl) .Marland Kitchen.. 1 Tab By Mouth At Bedtime 5)  Amlodipine Besylate 10 Mg Tabs (Amlodipine Besylate) .Marland Kitchen.. 1 Tab By Mouth Daily 6)  Carvedilol 3.125 Mg  Tabs (Carvedilol) .... One Tablet Twice A Day For Blood Pressure 7)  Ambien 10 Mg  Tabs (Zolpidem Tartrate) .... Take One Tablet At Bedtime For Insomnia 8)  Melatonin 5 Mg  Tabs (Melatonin) .... One Tablet At 6pm Nightly For Two Weeks 9)  Alprazolam 0.5 Mg  Tabs (Alprazolam) .Marland Kitchen.. 1 Tab By Mouth Two Times A Day As Needed Anxity 10)  Meclizine Hcl 25 Mg Tabs (Meclizine Hcl) .... Take One Tab By Mouth Two Times A Day For Dizziness 11)  Tramadol Hcl 50 Mg Tabs (Tramadol Hcl) .... Take One Pill Every 6  Hours 12)  Venlafaxine Hcl 37.5 Mg Xr24h-Tab (Venlafaxine Hcl) .... Take One Tablet Daily For 7 Days, Then Increase To 2 Tablets Daily.  Allergies (verified): No Known Drug Allergies  Review of Systems        vitals reviewed and pertinent negatives and positives seen in HPI   Physical Exam  General:  Well-developed,well-nourished,in no acute distress; alert,appropriate and cooperative throughout examination Lungs:  Normal respiratory effort, chest expands symmetrically. Lungs are clear to auscultation, no crackles or wheezes. Heart:  Normal rate and regular rhythm. S1 and S2 normal without gallop, murmur, click, rub or other extra sounds. Psych:  depressed affect, subdued, poor eye contact, and tearful.     Impression & Recommendations:  Problem # 1:  FATIGUE (ICD-780.79) Assessment New Pt is feeling more and more fatigued. She says that  she has been slowly getting worse. This may be due to depression but would like to r/o organic causes as well.   Orders: CBC w/Diff-[facility] [phone]) Comp Met-[facility] [phone]) TSH-[facility] [id number]) Vit D, 25 OH-[facility] ([id number]) B12-[facility] (96295-28413) [facility]- Est Level  3 (24401)  Problem # 2:  DEPRESSION, MAJOR, RECURRENT (ICD-296.30) Assessment: Deteriorated Pt likely has worsening depression. Did a PHQ9 on her today and will repeat at her next visit. The score was 16 (moderate Major Depression). changed med from Sertraline to Effexor (doing 7 day crossover/wean) and will reevaluate in 1 month. Recommended making appointment with Dr. Pascal Lux for counseling and resources.   Orders: [facility]- Est Level  3 (02725)  Problem # 3:  ANXIETY DISORDER (ICD-300.00) Assessment: Deteriorated Pt is using her alprazolam but not all the time. She has anxiety and feelings that something bad is going to happen to her.  Her updated medication list for this problem includes:    Sertraline Hcl 100 Mg Tabs (Sertraline hcl) .Marland Kitchen... 1 tab by mouth at bedtime    Alprazolam 0.5 Mg Tabs (Alprazolam) .Marland Kitchen... 1 tab by mouth two times a day as needed anxity    Venlafaxine Hcl 37.5 Mg Xr24h-tab (Venlafaxine hcl) .Marland Kitchen... Take one tablet daily for 7 days, then increase to 2 tablets daily.  Orders: [facility]- Est Level  3 ([address]644)  Complete Medication List: 1)  Aspirin Childrens 81 Mg Chew (Aspirin) .... Take 1 tablet by mouth once a day 2)  Hydrochlorothiazide 25 Mg Tabs (Hydrochlorothiazide) .Marland Kitchen.. 1 tab by mouth daily 3)  Ventolin Hfa 108 (90 Base) Mcg/act Aers (Albuterol sulfate) .... Inhale 2 puff as directed every four hours 4)  Sertraline Hcl 100 Mg Tabs (Sertraline hcl) .Marland Kitchen.. 1 tab by mouth at bedtime 5)  Amlodipine Besylate 10 Mg Tabs (Amlodipine besylate) .Marland Kitchen.. 1 tab by mouth daily 6)  Carvedilol 3.125 Mg Tabs (Carvedilol) .... One tablet twice a day for blood pressure 7)  Ambien 10 Mg Tabs (Zolpidem tartrate) .... Take one tablet at  bedtime for insomnia 8)  Melatonin 5 Mg Tabs (Melatonin) .... One tablet at 6pm nightly for two weeks 9)  Alprazolam 0.5 Mg Tabs (Alprazolam) .Marland Kitchen.. 1 tab by mouth two times a day as needed anxity 10)  Meclizine Hcl 25 Mg Tabs (Meclizine hcl) .... Take one tab by mouth two times a day for dizziness 11)  Tramadol Hcl 50 Mg Tabs (Tramadol hcl) .... Take one pill every 6 hours 12)  Venlafaxine Hcl 37.5 Mg Xr24h-tab (Venlafaxine hcl) .... Take one tablet daily for 7 days, then increase to 2 tablets daily.  Other Orders: Future Orders: Lipid-[facility] ([id number]) ... 04/05/2010  Patient Instructions: 1)  I want you  to see Dr. Pascal Lux to discuss your life challanges.  2)  We are going to add a new medication: 3)  Take the Venlafaxine 35 mg daily for the next 7 days then increase to 2 tablets (at the same time = 75 mg) daily. We can increase the dose in the future if you think it is not helping enough but this is a good place to start.  4)  Take half the amount of Sertaline (so 50 mg instead of 100mg ) daily for 7 days and then you can stop this medication.  5)  Call me if you have any problems with this switch in meds. I want to see you in 1 month.  6)  We are doing some labs today. If there is anything of concern I will call you or send you a letter.  7)  I hope you start to feel better.  Prescriptions: VENLAFAXINE HCL 37.5 MG XR24H-TAB (VENLAFAXINE HCL) take one tablet daily for 7 days, then increase to 2 tablets daily.  #64 x 3   Entered and Authorized by:   Jamie Brookes MD   Signed by:   Jamie Brookes MD on 04/06/2009   Method used:   Electronically to        [facility] (retail)       [address]       [place], [place]  [id number]       Ph: [phone]       Fax: [phone]   RxID:   [id number]

## 2010-02-07 NOTE — Assessment & Plan Note (Signed)
Summary: back pain,tcb   Vital Signs:  Patient profile:   55 year old female Height:      64.5 inches Weight:      209.7 pounds BMI:     35.57 Temp:     98.4 degrees F oral Pulse rate:   76 / minute BP sitting:   152 / 94  (left arm) Cuff size:   regular  Vitals Entered By: Gladstone Pih (July 07, 2009 10:37 AM) CC: Back pain, chronic Is Patient Diabetic? No Pain Assessment Patient in pain? yes     Location: back Onset of pain  Chronic Comments No HTN meds for 2 days   Primary Care Provider:  Jamie Brookes MD  CC:  Back pain and chronic.  History of Present Illness: 55 yo here to follow up back pain.  Tried Tramadol (2 daily).  Pain 10/10, with Tramadol, 8/10.  Pt has had percocet before and this helped pain.  Has missed work because of pain.  Pain in low back, radiates down legs.  Pain goes down entire legs, fornt and back.  Tried heating pad, no relief.  Works in [employer], Conservation officer, nature, prep and stocking.  Lots of standing.  No incontinence, except when she has anxiety attacks.  No numbness. No saddle anesthesia. Knees also hurt.  Sitting relieves pain a little.   Reports she took her BP meds this morning.   Habits & Providers  Alcohol-Tobacco-Diet     Tobacco Status: quit > 6 months     Tobacco Counseling: to remain off tobacco products  Current Medications (verified): 1)  Aspirin Childrens 81 Mg Chew (Aspirin) .... Take 1 Tablet By Mouth Once A Day 2)  Hydrochlorothiazide 25 Mg Tabs (Hydrochlorothiazide) .Marland Kitchen.. 1 Tab By Mouth Daily 3)  Ventolin Hfa 108 (90 Base) Mcg/act Aers (Albuterol Sulfate) .... Inhale 2 Puff As Directed Every Four Hours 4)  Sertraline Hcl 100 Mg Tabs (Sertraline Hcl) .Marland Kitchen.. 1 Tab By Mouth At Bedtime 5)  Amlodipine Besylate 10 Mg Tabs (Amlodipine Besylate) .Marland Kitchen.. 1 Tab By Mouth Daily 6)  Carvedilol 3.125 Mg  Tabs (Carvedilol) .... One Tablet Twice A Day For Blood Pressure 7)  Ambien 10 Mg  Tabs (Zolpidem Tartrate) .... Take One Tablet At Bedtime For  Insomnia 8)  Alprazolam 0.5 Mg  Tabs (Alprazolam) .Marland Kitchen.. 1 Tab By Mouth Two Times A Day As Needed Anxity 9)  Meclizine Hcl 25 Mg Tabs (Meclizine Hcl) .... Take One Tab By Mouth Two Times A Day For Dizziness 10)  Tramadol Hcl 50 Mg Tabs (Tramadol Hcl) .... Take One Pill Every 6 Hours 11)  Carvedilol 6.25 Mg Tabs (Carvedilol) .... Take One By Mouth Twice Daily For Blood Pressure.  Allergies (verified): No Known Drug Allergies PMH-FH-SH reviewed for relevance  Social History: single/divorced, G6P6, 2grandchildren  in home, 14 grandchildren, [employer] services, quit tob 96, no ETOH, no drugs, daughter s/p liver transplant-passed away 08-Nov-2022 , now raising her grandchildren ([place]ia and [place]) s/p her daughters death. Smoking Status:  quit > 6 months  Review of Systems       see HPI  Physical Exam  General:  Obese,well-nourished,in no acute distress; alert,appropriate and cooperative throughout examination. vitals noted Msk:  Back: Mild TTP sacral region.  No midline TTP.  No erythema or warmth.  Antalagic gait. Patellar  DTRs absent. No clonus.  Sensation intact.     Impression & Recommendations:  Problem # 1:  BACK PAIN, LOW (ICD-724.2)  Unclear etiology.  Pt last had films in  2008 and now with increase in pain.  Will recheck films today given her increase in pain and advanced age.  Try to increase tramadol ot more frequent dosing.  Refer to PT.  Pt to f/u with [place] if no relief.  Weight loss also encouraged.  Pt interested in meeting with Dr. Gerilyn Pilgrim and with looking into [facility]   Her updated medication list for this problem includes:    Aspirin Childrens 81 Mg Chew (Aspirin) .Marland Kitchen... Take 1 tablet by mouth once a day    Tramadol Hcl 50 Mg Tabs (Tramadol hcl) .Marland Kitchen... Take one pill every 6 hours  Orders: Radiology other (Radiology Other) Physical Therapy Referral (PT) [facility]- Est Level  3 ([address])  Problem # 2:  HYPERTENSION, BENIGN SYSTEMIC (ICD-401.1) Pt  reports she took all her meds today.  Repeat BP by me still elevated.  Will increase carvedilol.  F/u soon with Dr. Clotilde Dieter. Her updated medication list for this problem includes:    Hydrochlorothiazide 25 Mg Tabs (Hydrochlorothiazide) .Marland Kitchen... 1 tab by mouth daily    Amlodipine Besylate 10 Mg Tabs (Amlodipine besylate) .Marland Kitchen... 1 tab by mouth daily    Carvedilol 3.125 Mg Tabs (Carvedilol) ..... One tablet twice a day for blood pressure    Carvedilol 6.25 Mg Tabs (Carvedilol) .Marland Kitchen... Take one by mouth twice daily for blood pressure.  Orders: [facility]- Est Level  3 (60454)  Complete Medication List: 1)  Aspirin Childrens 81 Mg Chew (Aspirin) .... Take 1 tablet by mouth once a day 2)  Hydrochlorothiazide 25 Mg Tabs (Hydrochlorothiazide) .Marland Kitchen.. 1 tab by mouth daily 3)  Ventolin Hfa 108 (90 Base) Mcg/act Aers (Albuterol sulfate) .... Inhale 2 puff as directed every four hours 4)  Sertraline Hcl 100 Mg Tabs (Sertraline hcl) .Marland Kitchen.. 1 tab by mouth at bedtime 5)  Amlodipine Besylate 10 Mg Tabs (Amlodipine besylate) .Marland Kitchen.. 1 tab by mouth daily 6)  Carvedilol 3.125 Mg Tabs (Carvedilol) .... One tablet twice a day for blood pressure 7)  Ambien 10 Mg Tabs (Zolpidem tartrate) .... Take one tablet at bedtime for insomnia 8)  Alprazolam 0.5 Mg Tabs (Alprazolam) .Marland Kitchen.. 1 tab by mouth two times a day as needed anxity 9)  Meclizine Hcl 25 Mg Tabs (Meclizine hcl) .... Take one tab by mouth two times a day for dizziness 10)  Tramadol Hcl 50 Mg Tabs (Tramadol hcl) .... Take one pill every 6 hours 11)  Carvedilol 6.25 Mg Tabs (Carvedilol) .... Take one by mouth twice daily for blood pressure.  Patient Instructions: 1)  Please take 2 of the carvedilol pills in the morning and 2 at night for your blood pressure.  I will send a new prescription in for the higher dose.  2)  We will send a referral to physical therapy. 3)  Try taking the tramadol pills every 6 hours for the pain. 4)  Please schedule an appt with Dr. Gerilyn Pilgrim, our  nuritionist. 5)  Let us know if the pain is not better. 6)  You might think about water aerobics through work or through [employer].   Prescriptions: CARVEDILOL 6.25 MG TABS (CARVEDILOL) Take one by mouth twice daily for blood pressure.  #60 x 0   Entered and Authorized by:   Deshaun Schou [place] MD   Signed by:   Penney Domanski [place] MD on 07/07/2009   Method used:   Print then Give to Patient   RxID:   [id number] HYDROCHLOROTHIAZIDE 25 MG TABS (HYDROCHLOROTHIAZIDE) 1 tab by  mouth daily  #90 x 1   Entered and Authorized by:   Soraya Paquette [place] MD   Signed by:   Loyalty Brashier [place] MD on 07/07/2009   Method used:   Print then Give to Patient   RxID:   [id number] ALPRAZOLAM 0.5 MG  TABS (ALPRAZOLAM) 1 tab by mouth two times a day as needed anxity  #62 x 1   Entered and Authorized by:   Chaniah Cisse [place] MD   Signed by:   Lavette Yankovich [place] MD on 07/07/2009   Method used:   Print then Give to Patient   RxID:   [id number] AMLODIPINE BESYLATE 10 MG TABS (AMLODIPINE BESYLATE) 1 tab by mouth daily  #90 x 0   Entered and Authorized by:   Jshon Ibe [place] MD   Signed by:   Chinmay Squier [place] MD on 07/07/2009   Method used:   Print then Give to Patient   RxID:   [id number]

## 2010-02-07 NOTE — Progress Notes (Signed)
Summary: phn msg  Phone Note Call from Patient Call back at [facility] Phone [phone]   Caller: Patient Summary of Call: Pt found out that the health dept does not carry the venlafaxine wanted to let Dr. Clotilde Dieter know. Initial call taken by: Clydell Hakim,  April 18, 2009 4:16 PM  Follow-up for Phone Call        to PCP Follow-up by: Gladstone Pih,  April 18, 2009 4:35 PM  Additional Follow-up for Phone Call Additional follow up Details #1::        let's just keep the patient on Zofolt until we figure out a way to get it cheaper or the MAP program gets that med.  Additional Follow-up by: Jamie Brookes MD,  April 22, 2009 2:10 PM     Appended Document: phn msg Message left on machine as to the above.  Starleen Blue RN 04/25/2009

## 2010-02-07 NOTE — Assessment & Plan Note (Signed)
Summary: EKG and CBC/ls  Nurse Visit  EKG done, discussed with Dr Tressia Danas, preceptor, state EKG was normal that pt can go and to forward note to PCP.Marland KitchenGladstone Pih  January 20, 2009 4:13 PM   Vitals Entered By: Gladstone Pih (January 20, 2009 4:12 PM)  Allergies: No Known Drug Allergies  Orders Added: 1)  Est Level 1- [facility] [[address]] 2)  EKG- [facility] [EKG]

## 2010-02-07 NOTE — Assessment & Plan Note (Signed)
Summary: depression   Vital Signs:  Patient profile:   55 year old female Weight:      210.7 pounds Temp:     98.5 degrees F oral Pulse rate:   845 / minute Pulse rhythm:   regular BP sitting:   136 / 81  (left arm) Cuff size:   large  Vitals Entered By: Loralee Pacas CMA (May 02, 2009 3:16 PM)  Primary Care Provider:  Jamie Brookes MD  CC:  f/u depression. Marland Kitchen  History of Present Illness: Depression: Pt was feeling very depressed and tearful the last time she was in my office. She has since then continued on her Zoloft and started doing therapy with Dr. Pascal Lux, Psychologist. She is starting to identify the things in her life that are causing her stress and depression. She has identify some anger at the childrens father for not taking an active role in their lives, she also is considering visiting the grave of her daughter that died last year. Overall, she is doing a little better. The antidepressants that I prescribed last time were too expensive so she is happy to stay on the Zoloft.   Allergies: No Known Drug Allergies  Review of Systems        vitals reviewed and pertinent negatives and positives seen in HPI   Physical Exam  General:  Well-developed,well-nourished,in no acute distress; alert,appropriate and cooperative throughout examination Psych:  good eye contact and depressed affect.     Impression & Recommendations:  Problem # 1:  DEPRESSION, MAJOR, RECURRENT (ICD-296.30) Assessment Unchanged Pt has just started therapy with Dr. Pascal Lux. She has not made any major decisions to affect her happiness yet, but is contemplating some of the suggestions from her last visit. She has another appt with Dr. Pascal Lux in May. She is happy to stay on Zoloft. Refilled.   Orders: [facility]- Est Level  3 (18841)  Complete Medication List: 1)  Aspirin Childrens 81 Mg Chew (Aspirin) .... Take 1 tablet by mouth once a day 2)  Hydrochlorothiazide 25 Mg Tabs (Hydrochlorothiazide) .Marland Kitchen.. 1 tab by  mouth daily 3)  Ventolin Hfa 108 (90 Base) Mcg/act Aers (Albuterol sulfate) .... Inhale 2 puff as directed every four hours 4)  Sertraline Hcl 100 Mg Tabs (Sertraline hcl) .Marland Kitchen.. 1 tab by mouth at bedtime 5)  Amlodipine Besylate 10 Mg Tabs (Amlodipine besylate) .Marland Kitchen.. 1 tab by mouth daily 6)  Carvedilol 3.125 Mg Tabs (Carvedilol) .... One tablet twice a day for blood pressure 7)  Ambien 10 Mg Tabs (Zolpidem tartrate) .... Take one tablet at bedtime for insomnia 8)  Melatonin 5 Mg Tabs (Melatonin) .... One tablet at 6pm nightly for two weeks 9)  Alprazolam 0.5 Mg Tabs (Alprazolam) .Marland Kitchen.. 1 tab by mouth two times a day as needed anxity 10)  Meclizine Hcl 25 Mg Tabs (Meclizine hcl) .... Take one tab by mouth two times a day for dizziness 11)  Tramadol Hcl 50 Mg Tabs (Tramadol hcl) .... Take one pill every 6 hours 12)  Vitamin D (ergocalciferol) 50000 Unit Caps (Ergocalciferol) .... Take one pill a week starting on sunday for the next 12 weeks.  Patient Instructions: 1)  Consider letting the MAP program assist with your meds. 2)  Come back some time in May and get your fasting labs done.  3)  I will see you when you need me next.  Prescriptions: AMBIEN 10 MG  TABS (ZOLPIDEM TARTRATE) Take one tablet at bedtime for insomnia  #30 x 6   Entered  and Authorized by:   Jamie Brookes MD   Signed by:   Jamie Brookes MD on 05/02/2009   Method used:   Printed then faxed to ...       [facility] (retail)       [address]       [place], [place]  [id number]       Ph: [phone]       Fax: [phone]   RxID:   [id number] TRAMADOL HCL 50 MG TABS (TRAMADOL HCL) take one pill every 6 hours  #120 x 6   Entered and Authorized by:   Jamie Brookes MD   Signed by:   Jamie Brookes MD on 05/02/2009   Method used:   Electronically to        [facility] [address] (retail)       [address]       [place], [place]  [id number]       Ph: [phone]       Fax:  [phone]   RxID:   [id number]

## 2010-02-09 NOTE — Miscellaneous (Signed)
Summary: asthma QI  Clinical Lists Changes  Problems: Changed problem from ASTHMA, UNSPECIFIED (ICD-493.90) to ASTHMA, INTERMITTENT (ICD-493.90)

## 2010-03-31 ENCOUNTER — Ambulatory Visit (INDEPENDENT_AMBULATORY_CARE_PROVIDER_SITE_OTHER): Payer: Self-pay | Admitting: Family Medicine

## 2010-03-31 ENCOUNTER — Encounter: Payer: Self-pay | Admitting: Family Medicine

## 2010-03-31 DIAGNOSIS — L309 Dermatitis, unspecified: Secondary | ICD-10-CM

## 2010-03-31 DIAGNOSIS — IMO0001 Reserved for inherently not codable concepts without codable children: Secondary | ICD-10-CM

## 2010-03-31 DIAGNOSIS — I10 Essential (primary) hypertension: Secondary | ICD-10-CM

## 2010-03-31 DIAGNOSIS — K219 Gastro-esophageal reflux disease without esophagitis: Secondary | ICD-10-CM

## 2010-03-31 DIAGNOSIS — F339 Major depressive disorder, recurrent, unspecified: Secondary | ICD-10-CM

## 2010-03-31 DIAGNOSIS — R110 Nausea: Secondary | ICD-10-CM

## 2010-03-31 DIAGNOSIS — L259 Unspecified contact dermatitis, unspecified cause: Secondary | ICD-10-CM

## 2010-03-31 MED ORDER — TRIAMCINOLONE ACETONIDE 0.025 % EX OINT: TOPICAL_OINTMENT | Freq: Two times a day (BID) | CUTANEOUS | Status: DC

## 2010-03-31 MED ORDER — ALPRAZOLAM 0.5 MG PO TABS: 0.5000 mg | ORAL_TABLET | Freq: Two times a day (BID) | ORAL | Status: DC | PRN

## 2010-03-31 MED ORDER — ONDANSETRON 4 MG PO TBDP: 4.0000 mg | ORAL_TABLET | Freq: Three times a day (TID) | ORAL | Status: AC | PRN

## 2010-03-31 MED ORDER — MECLIZINE HCL 25 MG PO TABS: 25.0000 mg | ORAL_TABLET | Freq: Two times a day (BID) | ORAL | Status: DC

## 2010-03-31 MED ORDER — ZOLPIDEM TARTRATE 10 MG PO TABS: 10.0000 mg | ORAL_TABLET | Freq: Every day | ORAL | Status: DC

## 2010-03-31 MED ORDER — ESOMEPRAZOLE MAGNESIUM 40 MG PO PACK: 40.0000 mg | PACK | Freq: Every day | ORAL | Status: DC

## 2010-03-31 NOTE — Patient Instructions (Signed)
It was nice to see you today.  I will work on the referral for Stacy Campbell and you call me if you find out anything from [facility] or [facility].  Keep taking your BP meds.  Come back in 2 weeks to get it rechecked on a day that you are not all upset.  If it is still high we may need to add another pill.

## 2010-04-05 NOTE — Assessment & Plan Note (Signed)
Triamcinolone cream refilled.

## 2010-04-05 NOTE — Progress Notes (Signed)
HTN: Pt is here today for discussion of her blood pressure. It is high today and she has been very upset about her grandchildren who she is raising and wonders if that is what is making her BP go up. She is taking her Coreg and HCTZ as directed. No side effects noted.   Nausea: Pt has been nauseated for the last 10 days. She says that she is out of the meclizine and wants a refill. She does feel dizzy at time and this seems to help. She would also like some zofran for when the nausea gets really bad. Smells seem to make her symptoms worse. No abd pain.   Reflux:  Pt has h/o reflux, she has belching with a bitter taste in his mouth.  Anorexia:  Pt says she has cravings for weight foods (like sour mild banana peppers) but that in general her appetite is down. She says that smells make her nauseated. She does have new lower dentures but she is not wearing them. ? If they do not fit correctly and that is why she isn't eating as well and feels nauseated?   ROS: neg except as noted in HPI.   PE:  Gen: NAD, sitting comfortably on table.  HEENT: mouth is normal with no erythema in post-pharynx, no LAD, neck is normal. Abd: no masses, + BS, soft, NT/ND, obese

## 2010-04-05 NOTE — Assessment & Plan Note (Signed)
Plan to keep her on her same meds and have her come back at a time when she is not so upset with concern for her grandchildren whom she is raising.

## 2010-04-05 NOTE — Assessment & Plan Note (Signed)
PT started on Nexium for reflux. May be contributing to her nausea.

## 2010-04-05 NOTE — Assessment & Plan Note (Signed)
I suspect the patient does not feel like eating because she is depressed. Pt has only lost about 3 lbs since the last time she was in. I am not concerned about major anorexia but it does appear to concern the patient. No changes made at this time.

## 2010-05-10 ENCOUNTER — Encounter: Payer: Self-pay | Admitting: Family Medicine

## 2010-05-10 ENCOUNTER — Ambulatory Visit (INDEPENDENT_AMBULATORY_CARE_PROVIDER_SITE_OTHER): Payer: Self-pay | Admitting: Family Medicine

## 2010-05-10 VITALS — BP 161/92 | HR 78 | Temp 98.6°F | Ht 64.0 in | Wt 211.6 lb

## 2010-05-10 DIAGNOSIS — E559 Vitamin D deficiency, unspecified: Secondary | ICD-10-CM | POA: Insufficient documentation

## 2010-05-10 DIAGNOSIS — R252 Cramp and spasm: Secondary | ICD-10-CM

## 2010-05-10 DIAGNOSIS — L259 Unspecified contact dermatitis, unspecified cause: Secondary | ICD-10-CM

## 2010-05-10 DIAGNOSIS — L309 Dermatitis, unspecified: Secondary | ICD-10-CM

## 2010-05-10 DIAGNOSIS — I10 Essential (primary) hypertension: Secondary | ICD-10-CM

## 2010-05-10 LAB — BASIC METABOLIC PANEL
BUN: 15 mg/dL (ref 6–23)
Potassium: 4 mEq/L (ref 3.5–5.3)

## 2010-05-10 MED ORDER — CARVEDILOL 25 MG PO TABS: 25.0000 mg | ORAL_TABLET | Freq: Two times a day (BID) | ORAL | Status: DC

## 2010-05-10 MED ORDER — CYCLOBENZAPRINE HCL 10 MG PO TABS: 10.0000 mg | ORAL_TABLET | Freq: Every evening | ORAL | Status: DC

## 2010-05-10 NOTE — Assessment & Plan Note (Signed)
I suspect this has something to do with her electrolytes or Vit D deficiency.   Will check BMET and get Vit d level.  Will treat worst spasms with flexeril which helped before.

## 2010-05-10 NOTE — Progress Notes (Signed)
Muscle spasms: Pt is having Rt leg muscle cramps and spasms. They seems to be worse at night but can happen all day. She has not started on any new meds. Her symptoms just started on Sat night (4 days ago) and lasted for 3 days. She says when the cramps come she tries to walk them out but couldn't. She has had these kinds of cramps/spasms in the past.   HTN:  Pt is having elevated BP's. She says she is taking her BP med as prescribed but she continues to have elevated BP's. Her systolic BP was 158 at her last visit. Discussed increasing her medication.   Low Vit D: Pt patient was started in a regimen last year to increase her Vit D levels. They were <10 last year. She took the weeks of 50,000 units but then never started in a daily Vit D supplement.   ROS: neg except as noted above.   PE;  Gen: sitting comfortably on the table.   HEENT: Carver/AT, PERRL, EOMI, no external deformities CV: RRR, no murmur Pulm:CATB, no wheezes or crackles Ext: bilateral gastrocnemius muscles are tender to squeeze but not erythematous or swollen. Left lower extremity has some open scratches that the patient says are areas of eczema with scaling skin and open wound. Marland Kitchen

## 2010-05-10 NOTE — Assessment & Plan Note (Signed)
Pt has a newly reopened area of skin on her left lower extremity. Encouraged her to use antibiotic ointment and kenalog ointment on the leg wounds that she says are areas of her eczema.

## 2010-05-10 NOTE — Assessment & Plan Note (Signed)
Worsening, BP is elevated again today.  Will maximize her Coreg before adding a new agent.

## 2010-05-10 NOTE — Assessment & Plan Note (Signed)
Pt had a vit D level of <10 last March, 2011. She took the prescribed length of 50,000 units weekly but didn't continue taking the daily dose. I suspect some of her leg pain is from this. Will recheck her Vit D. Level today.

## 2010-05-10 NOTE — Patient Instructions (Signed)
You can try some of the Itch ease ointments or creams for itch relief on your leg.  I encourage you to take a multivitamin daily and to eat fresh fruits and vegetables.  I have refilled a 30 day supply of the flexeril for the worst muscle spasms. We are checking your electrolytes today.

## 2010-05-12 ENCOUNTER — Other Ambulatory Visit: Payer: Self-pay | Admitting: Family Medicine

## 2010-05-12 MED ORDER — ERGOCALCIFEROL 1.25 MG (50000 UT) PO CAPS: 50000.0000 [IU] | ORAL_CAPSULE | ORAL | Status: DC

## 2010-05-12 NOTE — Assessment & Plan Note (Signed)
Pt found to have a vit D def with a level of 20.

## 2010-05-15 ENCOUNTER — Telehealth: Payer: Self-pay | Admitting: *Deleted

## 2010-05-15 NOTE — Telephone Encounter (Signed)
Message copied by Jimmy Footman on Mon May 15, 2010  9:31 AM ------      Message from: [place], [place]      Created: Fri May 12, 2010  5:19 PM      Regarding: Please call pt       Let her know that she needs to pick up a Rx for Vit D 50,000 units and take 1 weekly for the next 10 weeks.             ----- Message -----         From: [facility]         Sent: 05/10/2010   9:27 PM           To: Visual merchandiser

## 2010-05-15 NOTE — Telephone Encounter (Signed)
Poke with patient and informed of rx to be picked up at [facility]

## 2010-05-29 ENCOUNTER — Other Ambulatory Visit: Payer: Self-pay | Admitting: Family Medicine

## 2010-05-29 NOTE — Telephone Encounter (Signed)
Refill request

## 2010-06-21 ENCOUNTER — Telehealth: Payer: Self-pay | Admitting: Family Medicine

## 2010-06-21 DIAGNOSIS — IMO0001 Reserved for inherently not codable concepts without codable children: Secondary | ICD-10-CM

## 2010-06-21 NOTE — Telephone Encounter (Signed)
Nexium prescription is too expensive and wants to know if something else can be prescribed or if she can go to [facility] to get this medication.

## 2010-06-22 MED ORDER — ESOMEPRAZOLE MAGNESIUM 40 MG PO PACK: 40.0000 mg | PACK | Freq: Every day | ORAL | Status: DC

## 2010-06-22 MED ORDER — ESOMEPRAZOLE MAGNESIUM 40 MG PO PACK: 40.0000 mg | PACK | Freq: Every day | ORAL | Status: AC

## 2010-06-22 NOTE — Telephone Encounter (Signed)
Addended by: Monica Becton on: 06/22/2010 05:28 PM   Modules accepted: Orders

## 2010-06-22 NOTE — Telephone Encounter (Addendum)
Sent to [facility]. Its cheaper there.

## 2010-06-22 NOTE — Telephone Encounter (Signed)
This medication is relatively cheap at [facility], I will fwd script there.

## 2010-06-22 NOTE — Telephone Encounter (Signed)
Can you please send this to the [facility] please

## 2010-06-26 NOTE — Telephone Encounter (Signed)
LVM for patient to call back to inform that rx was sent to [facility] outpatient pharmacy rather that [facility] per Dr. Benjamin Stain. He sent it there due to it actually being cheaper there

## 2010-06-27 ENCOUNTER — Telehealth: Payer: Self-pay | Admitting: Family Medicine

## 2010-06-27 NOTE — Telephone Encounter (Signed)
Patient calling back, says [facility] outpt pharmacy only fills controlled substances to nonemployees, they cannot fill nexium.

## 2010-06-27 NOTE — Telephone Encounter (Signed)
Called and informed pt of Rx at [facility] outpt pharm.  She stated that she would call and find out if they filled it b/c she was told they would not. I told pt that if she should have any problems to call back and notify us, pt agreed .Marland KitchenLoralee Pacas [place]

## 2010-07-25 ENCOUNTER — Encounter: Payer: Self-pay | Admitting: Family Medicine

## 2010-07-25 ENCOUNTER — Ambulatory Visit (INDEPENDENT_AMBULATORY_CARE_PROVIDER_SITE_OTHER): Payer: Self-pay | Admitting: Family Medicine

## 2010-07-25 VITALS — BP 139/94 | HR 72 | Temp 98.3°F | Ht 64.0 in | Wt 214.9 lb

## 2010-07-25 DIAGNOSIS — M545 Low back pain, unspecified: Secondary | ICD-10-CM

## 2010-07-25 DIAGNOSIS — R252 Cramp and spasm: Secondary | ICD-10-CM

## 2010-07-25 DIAGNOSIS — K219 Gastro-esophageal reflux disease without esophagitis: Secondary | ICD-10-CM

## 2010-07-25 DIAGNOSIS — IMO0001 Reserved for inherently not codable concepts without codable children: Secondary | ICD-10-CM

## 2010-07-25 DIAGNOSIS — E559 Vitamin D deficiency, unspecified: Secondary | ICD-10-CM

## 2010-07-25 DIAGNOSIS — R110 Nausea: Secondary | ICD-10-CM

## 2010-07-25 DIAGNOSIS — I10 Essential (primary) hypertension: Secondary | ICD-10-CM

## 2010-07-25 MED ORDER — ALPRAZOLAM 0.5 MG PO TABS: 0.5000 mg | ORAL_TABLET | Freq: Two times a day (BID) | ORAL | Status: DC | PRN

## 2010-07-25 MED ORDER — DICLOFENAC SODIUM 50 MG PO TBEC: 50.0000 mg | DELAYED_RELEASE_TABLET | Freq: Two times a day (BID) | ORAL | Status: DC

## 2010-07-25 MED ORDER — ZOLPIDEM TARTRATE 10 MG PO TABS: 10.0000 mg | ORAL_TABLET | Freq: Every day | ORAL | Status: DC

## 2010-07-25 MED ORDER — TRIAMCINOLONE ACETONIDE 0.025 % EX OINT: TOPICAL_OINTMENT | Freq: Two times a day (BID) | CUTANEOUS | Status: DC

## 2010-07-25 NOTE — Patient Instructions (Addendum)
It was nice to meet you today. Please pick up Xanax, Ambien, and Triamcinolone at your pharmacy. I will call your pharmacy about Nexium and try to find you a less expensive substitute. For your back pain, start taking Diclofenac for now.  Continue to exercise and use heating pads to lower back. If your pain worsens, please call me. Looking forward to seeing you again. Thank you.

## 2010-07-26 NOTE — Assessment & Plan Note (Signed)
Takes Flexeril 10 PO at bedtime. Advised patient to take 2 tablets at bedtime or 1 tab BID. Will continue to monitor.

## 2010-07-26 NOTE — Assessment & Plan Note (Signed)
Will start Diclofenac 50 mg q 8 prn for pain if this is truly osteoarthritis. Will D/C Tramadol due to adverse interactions with SSRI. Will review imaging and work-up for low back pain. Encouraged core and strength exercises, walking, etc. Heating pads to lower back to loosen muscles. Patient to RTC in 4-6 months or prn for worsening symptoms.

## 2010-07-26 NOTE — Assessment & Plan Note (Signed)
BP currently stable.  Continue current regimen.

## 2010-07-26 NOTE — Assessment & Plan Note (Signed)
Advised patient to take OTC Vitamin D supplements. Will recheck level in 4-6 months.

## 2010-07-26 NOTE — Assessment & Plan Note (Signed)
Patient cannot afford Nexium. I called [facility] and pharmacist recommended Prilosec as a substitute.  OTC cost $24.   Will call patient and let her know this is her best option.

## 2010-07-26 NOTE — Progress Notes (Signed)
  Subjective:    Patient ID: St[facility], female    DOB: Jan 25, 1955, 55 y.o.   MRN: [id number]  HPI Patient presents to clinic to meet new MD and to discuss back pain, muscle cramps, and GERD.  Back pain - chronic problem, has been going on for > 8 years.  Located lower back.  Pain is constant.  Worse with movement, especially at work.  Has had imaging done in the past which showed bone spurs and DJD.  Has tried Tramadol 50 q 4 prn, but pain persists.  Pain is dull and annoying.    Muscle cramps - located in bilateral lower extremities.  Has been evaluated by Dr. Clotilde Dieter who started patient on Flexeril.  Has helped somewhat, but still c/o leg cramps.  GERD - patient was given Rx for Nexium, but cannot afford.  Would like a substitute that is less expensive.  Pain complains of indigestion and reflux that is getting worse.   Review of Systems Endorses low back pain, reflux, and LE muscle cramps.  Denies fever, chills, sweats, chest pain, N/V, abdominal pain.    Objective:   Physical Exam  Constitutional: She appears well-developed and well-nourished. No distress.  HENT:  Head: Normocephalic and atraumatic.  Neck: Normal range of motion. Neck supple.  Cardiovascular: Normal rate, regular rhythm and normal heart sounds.  Exam reveals no gallop and no friction rub.   No murmur heard. Pulmonary/Chest: Breath sounds normal. No respiratory distress. She has no wheezes. She has no rales.  Musculoskeletal:       Lumbar back: She exhibits decreased range of motion, tenderness and pain. She exhibits no bony tenderness, no swelling, no edema, no deformity and no spasm.       Positive for tissue tenderness.  No assymetry, texture changes, or decreased ROM.    Neurological: No cranial nerve deficit.          Assessment & Plan:

## 2010-07-31 ENCOUNTER — Telehealth: Payer: Self-pay | Admitting: *Deleted

## 2010-07-31 NOTE — Telephone Encounter (Signed)
Message copied by Farrell Ours on Mon Jul 31, 2010 10:19 AM ------      Message from: DE LA CRUZ, IVY      Created: Wed Jul 26, 2010  6:52 PM      Regarding: Prilosec       Hi team, if you have a free minute, can you call this patient and let her know the cheapest medication for GERD is Prilosec. She can buy a box OTC at [facility] for $24.  She should take 1 tab PO before breakfast. Thank you.  Let me know if she has any questions and I can call her back.

## 2010-07-31 NOTE — Telephone Encounter (Signed)
Spoke with patient and informed of below 

## 2010-08-21 ENCOUNTER — Telehealth: Payer: Self-pay | Admitting: Family Medicine

## 2010-08-21 NOTE — Telephone Encounter (Signed)
Was in on 7/17 and was prescribed medication for back pain.  Was told to call back if it didn't help.  She has tried to get by with this medication, but it is not helping and would like something else.

## 2010-08-21 NOTE — Telephone Encounter (Signed)
Hi Keri, patient was instructed at last visit to return to clinic if symptoms worsen.  If she cannot wait to meet with me on Wednesday, then please advise her to go to Urgent Care or ED.  Thanks.

## 2010-08-21 NOTE — Telephone Encounter (Signed)
[facility]acy [facility] is calling back because she hasn't heard anything from the call from this morning.  She says this medication is not helping at all.

## 2010-08-21 NOTE — Telephone Encounter (Signed)
She made an appt for Wednesday and understands that if she cannot wait until then, she would need to go to Urgent Care or ER.

## 2010-08-23 ENCOUNTER — Encounter: Payer: Self-pay | Admitting: Family Medicine

## 2010-08-23 ENCOUNTER — Ambulatory Visit (INDEPENDENT_AMBULATORY_CARE_PROVIDER_SITE_OTHER): Payer: Self-pay | Admitting: Family Medicine

## 2010-08-23 DIAGNOSIS — M5430 Sciatica, unspecified side: Secondary | ICD-10-CM

## 2010-08-23 DIAGNOSIS — M545 Low back pain, unspecified: Secondary | ICD-10-CM

## 2010-08-23 MED ORDER — IBUPROFEN 600 MG PO TABS: 600.0000 mg | ORAL_TABLET | Freq: Four times a day (QID) | ORAL | Status: AC | PRN

## 2010-08-23 NOTE — Progress Notes (Signed)
  Subjective:    Patient ID: Stacy [facility], female    DOB: January 03, 1956, 55 y.o.   MRN: [id number]  HPI  Patient presents to clinic for follow up: chronic low back pain.  At last visit, I stopped Tramadol due to drug interaction with SSRI.  Started Diclofenac and recommended strength exercises and heating pads.  Patient called and complained of persistent pain, no relief with Diclofenac.  Pain is chronic and dull, but sharp/shooting pain at times.  Pain scale 10/10.  Located mid-line low back.  Associated with numbness/tingling of bilateral lower extremities.  Pain is constant.  Worse with exertion, better with rest/lying down and warm compresses.  Denies fever, chills, N/V, chest pain, abdominal pain.  Does endorse constipation, numbness/tingling of bil lower extremities.  Review of Systems  Per HPI    Objective:   Physical Exam  Constitutional: She is oriented to person, place, and time. She appears well-developed and well-nourished. No distress.  Neck: Normal range of motion and full passive range of motion without pain. Neck supple. No spinous process tenderness and no muscular tenderness present.  Cardiovascular: Regular rhythm and normal heart sounds.  Exam reveals no gallop and no friction rub.   No murmur heard. Pulmonary/Chest: Effort normal. No respiratory distress. She has no wheezes. She has no rales.  Musculoskeletal:       Lumbar back: She exhibits decreased range of motion, tenderness, pain and spasm. She exhibits no bony tenderness, no swelling, no edema and no deformity.       + straight leg raise  Neurological: She is alert and oriented to person, place, and time.  Psychiatric: Her speech is normal. Her affect is blunt. She is slowed and withdrawn.          Assessment & Plan:

## 2010-08-23 NOTE — Patient Instructions (Signed)
Stop taking Tramadol and Diclofenac. Start taking Ibuprofen 600 mg 1 tablet by mouth every 6 hours as need for pain, You may purchase Tylenol 325 mg at your local pharmacy.  You can take 1-2 tablets by mouth every 8 hours as needed for breakthrough pain. If your symptoms worsen, please call MD or return to clinic.  Lumbar Radiculopathy, Sciatica Sciatica is a weakness and/or changes in sensation (tingling, jolts, hot and cold, numbness) along the path the sciatic nerve travels. Irritation or damage to lumbar nerve roots is often also referred to as lumbar radiculopathy.  Lumbar radiculopathy (Sciatica) is the most common form of this problem. Radiculopathy can occur in any of the nerves coming out of the spinal cord. The problems caused depend on which nerves are involved. The sciatic nerve is the large nerve supplying the branches of nerves going from the hip to the toes. It often causes a numbness or weakness in the skin and/or muscles that the sciatic nerve serves. It also may cause symptoms (problems) of pain, burning, tingling, or electric shock-like feelings in the path of this nerve. This usually comes from injury to the fibers that make up the sciatic nerve. Some of these symptoms are low back pain and/or unpleasant feelings in the following areas:  From the mid-buttock down the back of the leg to the back of the knee.   And/or the outside of the calf and top of the foot.   And/or behind the inner ankle to the sole of the foot.  CAUSES  Herniated or slipped disc. Discs are the little cushions between the bones in the back.   Pressure by the piriformis muscle in the buttock on the sciatic nerve (Piriformis Syndrome).   Misalignment of the bones in the lower back and buttocks (Sacroiliac Joint Derangement).   Narrowing of the spinal canal that puts pressure on or pinches the fibers that make up the sciatic nerve.   A slipped vertebra that is out of line with those above or beneath it.     Abnormality of the nervous system itself so that nerve fibers do not transmit signals properly, especially to feet and calves (neuropathy).   Tumor (this is rare).  Your caregiver can usually determine the cause of your sciatica and begin the treatment most likely to help you. TREATMENT Taking over-the-counter painkillers, physical therapy, rest, exercise, spinal manipulation, and injections of anesthetics and/or steroids may be used. Surgery, acupuncture, and Yoga can also be effective. Mind over matter techniques, mental imagery, and changing factors such as your bed, chair, desk height, posture, and activities are other treatments that may be helpful. You and your caregiver can help determine what is best for you. With proper diagnosis, the cause of most sciatica can be identified and removed. Communication and cooperation between your caregiver and you is essential. If you are not successful immediately, do not be discouraged. With time, a proper treatment can be found that will make you comfortable. HOME CARE INSTRUCTIONS  If the pain is coming from a problem in the back, applying ice to that area for 10 minutes, 3 times per day while awake, may be helpful. Put the ice in a plastic bag. Place a towel between the bag of ice and your skin.   You may exercise or perform your usual activities if these do not aggravate your pain, or as suggested by your caregiver.   Only take over-the-counter or prescription medicines for pain, discomfort, or fever as directed by your caregiver.  If your caregiver has given you a follow-up appointment, it is very important to keep that appointment. Not keeping the appointment could result in a chronic or permanent injury, pain, and disability. If there is any problem keeping the appointment, you must call back to this facility for assistance.  SEEK IMMEDIATE MEDICAL CARE IF:  You experience loss of control of bowel or bladder.   You have increasing weakness  in the trunk, buttocks, or legs.   There is numbness in any areas from the hip down to the toes.   You have difficulty walking or keeping your balance.   You have any of the above, with fever or forceful vomiting.  Document Released: 12/19/2000 Document Re-Released: 01/16/2009 [facility] Patient Information 2011 [place], [place].

## 2010-08-29 DIAGNOSIS — M5430 Sciatica, unspecified side: Secondary | ICD-10-CM | POA: Insufficient documentation

## 2010-08-29 NOTE — Assessment & Plan Note (Signed)
Will discontinue Diclofenac due to minimal pain relief. Stressed the importance of stopping Tramadol due to drug interactions with SSRI. Will start Ibuprofen 600 mg every 6 hours as needed for pain. Will need to monitor kidney function at follow up visit - future order for BMET. Heating pads to lower back to loosen muscles. Patient to RTC in 4-6 months or prn for worsening symptoms.

## 2010-08-29 NOTE — Assessment & Plan Note (Signed)
Positive - left straight leg raise.  Low back pain associated with numbness/tingling.  May be secondary to sciatica.  Hand-out given to patient with exercises and treatment options.  Will treat pain with high dose ibuprofen.  Follow up in 4 months or as needed for worsening symptoms.

## 2010-10-02 LAB — COMPREHENSIVE METABOLIC PANEL
ALT: 76 — ABNORMAL HIGH
AST: 82 — ABNORMAL HIGH
Albumin: 3.5
CO2: 29
Calcium: 9.7
GFR calc Af Amer: >60
GFR calc non Af Amer: >60
Sodium: 140
Total Protein: 7.5

## 2010-10-02 LAB — DIFFERENTIAL
Basophils Absolute: 0
Basophils Relative: 1
Monocytes Relative: 10
Neutro Abs: 3.9
Neutrophils Relative %: 57

## 2010-10-02 LAB — CBC
MCHC: 33.7
Platelets: 228
RBC: 5

## 2010-10-02 LAB — POCT CARDIAC MARKERS
CKMB, poc: <1 — ABNORMAL LOW
Myoglobin, poc: 24.9
Operator id: 5362

## 2010-11-14 ENCOUNTER — Ambulatory Visit (INDEPENDENT_AMBULATORY_CARE_PROVIDER_SITE_OTHER): Payer: Self-pay | Admitting: Family Medicine

## 2010-11-14 ENCOUNTER — Encounter: Payer: Self-pay | Admitting: Family Medicine

## 2010-11-14 VITALS — BP 156/94 | HR 93 | Ht 65.0 in | Wt 222.0 lb

## 2010-11-14 DIAGNOSIS — H8110 Benign paroxysmal vertigo, unspecified ear: Secondary | ICD-10-CM

## 2010-11-14 DIAGNOSIS — F339 Major depressive disorder, recurrent, unspecified: Secondary | ICD-10-CM

## 2010-11-14 DIAGNOSIS — Z23 Encounter for immunization: Secondary | ICD-10-CM

## 2010-11-14 DIAGNOSIS — R609 Edema, unspecified: Secondary | ICD-10-CM

## 2010-11-14 MED ORDER — FUROSEMIDE 20 MG PO TABS: 20.0000 mg | ORAL_TABLET | Freq: Every day | ORAL | Status: DC

## 2010-11-14 MED ORDER — POTASSIUM CHLORIDE ER 10 MEQ PO TBCR: 20.0000 meq | EXTENDED_RELEASE_TABLET | Freq: Every day | ORAL | Status: DC

## 2010-11-14 MED ORDER — CYCLOBENZAPRINE HCL 10 MG PO TABS: 10.0000 mg | ORAL_TABLET | Freq: Two times a day (BID) | ORAL | Status: DC | PRN

## 2010-11-14 MED ORDER — ZOLPIDEM TARTRATE 10 MG PO TABS: 10.0000 mg | ORAL_TABLET | Freq: Every day | ORAL | Status: DC

## 2010-11-14 MED ORDER — LORAZEPAM 0.5 MG PO TABS: 0.5000 mg | ORAL_TABLET | Freq: Three times a day (TID) | ORAL | Status: DC

## 2010-11-14 NOTE — Patient Instructions (Signed)
It was good to see you again. For leg swelling, stop taking hydrochlorothiazide and start taking Lasix 20 mg daily. Start taking a potassium supplement daily. Remember to elevate your legs over your heart when you are sleeping/resting. You may also try wearing compression hose at work to decrease swelling (you can buy these over the counter). For depression, please continue taking Zoloft 100 mg daily. Please schedule an appointment with Dr. Pascal Lux, Doctor of Psychology. Please schedule a follow up appointment with me in 4 weeks.

## 2010-11-15 DIAGNOSIS — R609 Edema, unspecified: Secondary | ICD-10-CM | POA: Insufficient documentation

## 2010-11-15 NOTE — Assessment & Plan Note (Addendum)
Trace pedal edema on physical exam. Will discontinue HCTZ and start Lasix 20 mg daily. Will give Rx for potassium supplements. Advised patient to elevate feet over heart when sleeping/resting. Recommended purchasing compression hose to wear at work. Will need to check CMET (K, Cr) and monitor BP in 4 weeks.

## 2010-11-15 NOTE — Assessment & Plan Note (Signed)
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 Patient admitted to not taking Meclizine as directed. Encouraged patient to take Meclizine twice daily scheduled. Will follow up in 4 weeks and discuss exercises to do at home if symptoms do not improve with medication. Patient agreed/understood plan.

## 2010-11-15 NOTE — Progress Notes (Signed)
  Subjective:    Patient ID: Stacy Campbell, female    DOB: [birth year], 55 y.o.   MRN: [id number]  HPI  Patient presents to clinic to discuss multiple issues:  PEDAL EDEMA Patient complains of lower extremity swelling bilaterally.  This has been going on for months, but has been getting progressively worse in the last month.  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  Patient takes a fluid pill, hydrochlorothiazide, daily but she says it does not help.  She wears comfortable, supportive sneakers to work.  Denies any tenderness on palpation, decreased ROM, calf tenderness, or erythema/rash. Denies any recent trauma or injury.  Denies any abnormal gait or limping.  DIZZINESS This is a chronic issue.  Dizziness is worsened by moving head from side to side, especially when trying to sleep at night.  Patient denies taking Meclizine daily.  She politely declines any change in therapy or addition of antihistamine to reduce vertigo/dizziness.  Patient started to become tearful at this point and subject was changed to patient's depressed mood.  DEPRESSION Patient crying during encounter.  She complains of decreased energy, difficulty concentrating, difficulty sleeping, change in appetite.  This is an ongoing issue that has been addressed, but difficult to manage.  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  Patient and granddaughter do not get along.  Current regimen: Zoloft 100 mg daily.  I have tried prescribing Cymbalta in the past to treat chronic pain and depression, but patient unable to afford.  She denies any suicidal or homicidal thoughts.  She has seen Dr. Pascal Lux in the past, but it has been over a year since she has met with her.  Expresses interest in schedule follow up appointment with Dr. Pascal Lux.  Past Medical Hx, Social Hx, Medications, Allergies, and Problem List have all been reviewed during  visit.  Review of Systems  Per HPI    Objective:   Physical Exam  Constitutional:       Tearful, depressed  Neck: Neck supple. No JVD present.  Cardiovascular: Normal rate and regular rhythm.  Exam reveals no gallop and no friction rub.   No murmur heard. Pulmonary/Chest: Effort normal and breath sounds normal. She has no wheezes. She has no rales.  Musculoskeletal:       Arms:      Right foot: She exhibits normal range of motion.       Left foot: She exhibits normal range of motion.  Lymphadenopathy:    She has no cervical adenopathy.  Skin: Skin is warm and dry. No rash noted. No erythema. No pallor.  Psychiatric: Her speech is normal. Thought content normal. She is slowed and withdrawn. Cognition and memory are normal. She exhibits a depressed mood. She expresses no homicidal and no suicidal ideation.           Assessment & Plan:

## 2010-11-15 NOTE — Assessment & Plan Note (Signed)
Briefly discussed depression at this visit - she denies any homicidal or suicidal thought at this time. She does not think Zoloft is effective, but cannot afford Cymbalta. Advised patient to schedule a follow up appointment with me in 4 weeks to discuss depression. At that time, will consider discontinuing Zoloft and starting either Effexor or nortriptyline (to tx both depression and chronic pain). Also encouraged patient to schedule follow up appointment with Dr. Pascal Lux. Gave patient handout for [employer] [facility] [place] with 24 hr crisis numbers. Follow appointment in 4 weeks or sooner as needed.

## 2010-12-12 ENCOUNTER — Ambulatory Visit (INDEPENDENT_AMBULATORY_CARE_PROVIDER_SITE_OTHER): Payer: Self-pay | Admitting: Family Medicine

## 2010-12-12 ENCOUNTER — Encounter: Payer: Self-pay | Admitting: Family Medicine

## 2010-12-12 VITALS — BP 135/85 | HR 77 | Temp 98.2°F | Ht 65.0 in | Wt 220.0 lb

## 2010-12-12 DIAGNOSIS — I10 Essential (primary) hypertension: Secondary | ICD-10-CM

## 2010-12-12 DIAGNOSIS — R05 Cough: Secondary | ICD-10-CM

## 2010-12-12 DIAGNOSIS — R059 Cough, unspecified: Secondary | ICD-10-CM

## 2010-12-12 DIAGNOSIS — R609 Edema, unspecified: Secondary | ICD-10-CM

## 2010-12-12 LAB — COMPREHENSIVE METABOLIC PANEL
Albumin: 3.9 g/dL (ref 3.5–5.2)
BUN: 11 mg/dL (ref 6–23)
CO2: 28 mEq/L (ref 19–32)
Calcium: 9.5 mg/dL (ref 8.4–10.5)
Chloride: 105 mEq/L (ref 96–112)
Glucose, Bld: 113 mg/dL — ABNORMAL HIGH (ref 70–99)
Potassium: 4 mEq/L (ref 3.5–5.3)

## 2010-12-12 MED ORDER — GUAIFENESIN 100 MG/5ML PO LIQD: 200.0000 mg | Freq: Three times a day (TID) | ORAL | Status: AC | PRN

## 2010-12-12 MED ORDER — BENZONATATE 100 MG PO CAPS: 100.0000 mg | ORAL_CAPSULE | Freq: Three times a day (TID) | ORAL | Status: DC | PRN

## 2010-12-12 NOTE — Patient Instructions (Signed)
I will call or send letter with lab results. Schedule follow up appointment in 3 months or as needed.   Peripheral Edema You have swelling in your legs (peripheral edema). This swelling is due to excess accumulation of salt and water in your body. Edema may be a sign of heart, kidney or liver disease, or a side effect of a medication. It may also be due to problems in the leg veins. Elevating your legs and using special support stockings may be very helpful, if the cause of the swelling is due to poor venous circulation. Avoid long periods of standing, whatever the cause. Treatment of edema depends on identifying the cause. Chips, pretzels, pickles and other salty foods should be avoided. Restricting salt in your diet is almost always needed. Water pills (diuretics) are often used to remove the excess salt and water from your body via urine. These medicines prevent the kidney from reabsorbing sodium. This increases urine flow. Diuretic treatment may also result in lowering of potassium levels in your body. Potassium supplements may be needed if you have to use diuretics daily. Daily weights can help you keep track of your progress in clearing your edema. You should call your caregiver for follow up care as recommended. SEEK IMMEDIATE MEDICAL CARE IF:   You have increased swelling, pain, redness, or heat in your legs.   You develop shortness of breath, especially when lying down.   You develop chest or abdominal pain, weakness, or fainting.   You have a fever.  Document Released: 02/02/2004 Document Revised: 09/06/2010 Document Reviewed: 01/12/2009 [facility] Patient Information 2012 [place], [place].

## 2010-12-13 ENCOUNTER — Encounter: Payer: Self-pay | Admitting: Family Medicine

## 2010-12-13 DIAGNOSIS — R059 Cough, unspecified: Secondary | ICD-10-CM | POA: Insufficient documentation

## 2010-12-13 DIAGNOSIS — R05 Cough: Secondary | ICD-10-CM | POA: Insufficient documentation

## 2010-12-13 NOTE — Assessment & Plan Note (Signed)
Likely persistent cough after viral URI - Tessalon perles and Mucinex sent to pharmacy.

## 2010-12-13 NOTE — Progress Notes (Signed)
  Subjective:    Patient ID: Stacy Campbell, female    DOB: March 27, 1955, 55 y.o.   MRN: [id number]  HPI  Patient here for BP recheck and follow up pedal edema.  BP currently 138/85.  Patient denies any headache, chest pain, numbness/tingling of extremities, fatigue, changes in vision.  Pedal edema has improved on Lasix 20 mg daily.  She continues to take K supplements.  Continues to elevate legs at bedtime.  No compression hose because she has not needed it.  Patient due for CMET today to check for K and kidney function.   Dry cough: new symptom started last week.  Patient has had a head cold for about 2 weeks - rhinorrhea, congestion, and headache - have all resolved, but cough persists.  Patient unable to cough up phlegm.  She has not tried any OTC cough suppressants yet.  Denies any fever, chills, NS, nausea/vomiting, hemoptysis.     Review of Systems  Per HPI    Objective:   Physical Exam  Cardiovascular: Regular rhythm, normal heart sounds and intact distal pulses.  Exam reveals no gallop and no friction rub.   No murmur heard. Pulmonary/Chest: Effort normal and breath sounds normal. She has no wheezes. She has no rales.  Musculoskeletal: Normal range of motion. She exhibits no edema and no tenderness.         Assessment & Plan:

## 2010-12-13 NOTE — Assessment & Plan Note (Signed)
BP well-controlled.  Consider DC Lasix if edema returns.

## 2010-12-13 NOTE — Progress Notes (Deleted)
Patient ID: Stacy Campbell, female   DOB: 1955/05/03, 55 y.o.   MRN: [id number]

## 2010-12-13 NOTE — Assessment & Plan Note (Signed)
Much improved with Lasix.  Continue current regimen.  Will order CMET (K, Cr) today.

## 2011-01-10 ENCOUNTER — Encounter: Payer: Self-pay | Admitting: Family Medicine

## 2011-01-10 ENCOUNTER — Ambulatory Visit (INDEPENDENT_AMBULATORY_CARE_PROVIDER_SITE_OTHER): Payer: Self-pay | Admitting: Family Medicine

## 2011-01-10 DIAGNOSIS — R21 Rash and other nonspecific skin eruption: Secondary | ICD-10-CM

## 2011-01-10 DIAGNOSIS — F411 Generalized anxiety disorder: Secondary | ICD-10-CM

## 2011-01-10 MED ORDER — TRIAMCINOLONE ACETONIDE 0.1 % EX CREA: TOPICAL_CREAM | Freq: Two times a day (BID) | CUTANEOUS | Status: DC

## 2011-01-10 MED ORDER — SULFAMETHOXAZOLE-TRIMETHOPRIM 800-160 MG PO TABS: 1.0000 | ORAL_TABLET | Freq: Two times a day (BID) | ORAL | Status: AC

## 2011-01-10 MED ORDER — LORAZEPAM 0.5 MG PO TABS: 0.5000 mg | ORAL_TABLET | Freq: Two times a day (BID) | ORAL | Status: DC

## 2011-01-10 MED ORDER — HYDROXYZINE HCL 10 MG PO TABS: 10.0000 mg | ORAL_TABLET | Freq: Three times a day (TID) | ORAL | Status: DC | PRN

## 2011-01-10 NOTE — Progress Notes (Signed)
  Subjective:    Patient ID: Stacy Campbell, female    DOB: [birth year], 56 y.o.   MRN: [id number]  HPI  Patient presents to clinic with generalized rash and lesion of left lower extremity. Patient noticed rash on Christmas Day. She has a history of eczema, but she has never had a breakout like this before. Rash is located on her chest, arms, back, and posterior legs. Patient has had a stressful holiday (brother in ICU), and patient thinks rash could be secondary to "nerves". She has tried taking nondrowsy antihistamines to prevent itching and triamcinolone. This provides some relief.  Denies any exposure to bugs, poisonous plants.  Family members at home do not have rash.  Additionally, patient needs refill for Ativan.  She would rather have Xanax, because Ativan does not work as quickly. She takes Ativan twice a day for anxiety.  I have reviewed patient's social history, medications, medical history, problem list and allergies.  Review of Systems  Patient denies any fevers, chills, night sweats, URI symptoms, nausea or vomiting.    Objective:   Physical Exam  Constitutional: No distress.  HENT:  Mouth/Throat: Oropharynx is clear and moist.  Cardiovascular: Normal rate and regular rhythm.   Pulmonary/Chest: Effort normal and breath sounds normal. No respiratory distress.  Skin:       Generalized maculopapular rash (round, red, 1mm wide lesions) located on chest, back, bilateral upper and lower extremities.  Scabs from scratching lesions.  No active bleeding or pus.  Left shin - chronic, healing, and bruised lesion is now red, beefy, and warm.  About 5 cm x 3 cm wide.           Assessment & Plan:

## 2011-01-10 NOTE — Assessment & Plan Note (Signed)
Rash of unknown etiology.  No lesions in between fingers and toes, scabies less likely.  Will treat with triamcinolone ointment twice a day. Will also give Bactrim DS for possible cellulitis of the left shin.   Atarax as needed for itching.  Recommended Aveeno lotion or Eucerin for dry skin.  Followup in 4 weeks or sooner if necessary.

## 2011-01-10 NOTE — Patient Instructions (Signed)
Please pick up antibiotic from pharmacy and take for 10 days. You may use Triamcinolone ointment on your arms, legs, chest 2-3 x per day. Purchase OTC Aveeno, Eucerin, or Cetaphil lotion and apply to skin twice daily. If your leg lesion becomes more red, more painful, or if you develop fever, chills, nausea/vomiting, please return to clinic ASAP. Schedule a follow up appointment with PCP in 4 weeks.  Cellulitis Cellulitis is an infection of the skin and the tissue beneath it. The area is typically red and tender. It is caused by germs (bacteria) (usually staph or strep) that enter the body through cuts or sores. Cellulitis most commonly occurs in the arms or lower legs.  HOME CARE INSTRUCTIONS   If you are given a prescription for medications which kill germs (antibiotics), take as directed until finished.   If the infection is on the arm or leg, keep the limb elevated as able.   Use a warm cloth several times per day to relieve pain and encourage healing.   See your caregiver for recheck of the infected site as directed if problems arise.   Only take over-the-counter or prescription medicines for pain, discomfort, or fever as directed by your caregiver.  SEEK MEDICAL CARE IF:   The area of redness (inflammation) is spreading, there are red streaks coming from the infected site, or if a part of the infection begins to turn dark in color.   The joint or bone underneath the infected skin becomes painful after the skin has healed.   The infection returns in the same or another area after it seems to have gone away.   A boil or bump swells up. This may be an abscess.   New, unexplained problems such as pain or fever develop.  SEEK IMMEDIATE MEDICAL CARE IF:   You have a fever.   You or your child feels drowsy or lethargic.   There is vomiting, diarrhea, or lasting discomfort or feeling ill (malaise) with muscle aches and pains.  MAKE SURE YOU:   Understand these instructions.    Will watch your condition.   Will get help right away if you are not doing well or get worse.  Document Released: 10/04/2004 Document Revised: 09/06/2010 Document Reviewed: 08/13/2007 [facility] Patient Information 2012 [place], [place].

## 2011-01-10 NOTE — Assessment & Plan Note (Signed)
Will refill Ativan.  Advised against Xanax in elderly.

## 2011-01-15 ENCOUNTER — Ambulatory Visit: Payer: Self-pay | Admitting: Family Medicine

## 2011-01-29 ENCOUNTER — Other Ambulatory Visit: Payer: Self-pay | Admitting: Family Medicine

## 2011-01-29 NOTE — Telephone Encounter (Signed)
Refill request

## 2011-02-13 ENCOUNTER — Encounter: Payer: Self-pay | Admitting: Family Medicine

## 2011-02-13 ENCOUNTER — Ambulatory Visit (INDEPENDENT_AMBULATORY_CARE_PROVIDER_SITE_OTHER): Payer: Self-pay | Admitting: Family Medicine

## 2011-02-13 DIAGNOSIS — L280 Lichen simplex chronicus: Secondary | ICD-10-CM

## 2011-02-13 DIAGNOSIS — L281 Prurigo nodularis: Secondary | ICD-10-CM

## 2011-02-13 DIAGNOSIS — F411 Generalized anxiety disorder: Secondary | ICD-10-CM

## 2011-02-13 MED ORDER — CARVEDILOL 25 MG PO TABS: 25.0000 mg | ORAL_TABLET | Freq: Two times a day (BID) | ORAL | Status: DC

## 2011-02-13 MED ORDER — ALPRAZOLAM 0.5 MG PO TABS: 0.5000 mg | ORAL_TABLET | Freq: Three times a day (TID) | ORAL | Status: DC

## 2011-02-13 NOTE — Patient Instructions (Addendum)
It was good to see today. Keep the [employer] on for one week. Return to clinic in one week to remove the boot and recheck wound. Start taking Ativan one tablet every 8 hours scheduled. On February 9, throw away Ativan and pick up Xanax. Continue to take Xanax one tablet every 8 hours scheduled. If you experience any worsening symptoms, drowsiness and sedation, altered mental status, please report to the emergency room. Please call me with any questions or concerns.

## 2011-02-17 DIAGNOSIS — L281 Prurigo nodularis: Secondary | ICD-10-CM | POA: Insufficient documentation

## 2011-02-17 NOTE — Progress Notes (Signed)
  Subjective:    Patient ID: Stacy Campbell, female    DOB: [birth year], 56 y.o.   MRN: [id number]  HPI  Patient here for follow up left lower extremity rash.  This is a chronic rash that has been on and off for about one year.  Started as a eczematous rash one year ago on the back of ankle but has spread to anterior left shin.  Patient has completed course of Bactrim, Atarax and Triamcinolone for itching.  Patient says nothing has kept her from scratching rash.  Described as itchy, burning, and skin feels "tight."  Denies any fever, chills, sweats, or signs of systemic infection.  Anxiety disorder:  Patient still asking for Xanax because Ativan is not working.  She admits to having panic attacks daily.  Described as heaviness on chest followed by negative thoughts "like something bad is about to happen."  Ativan does not provide any relief.  Still able to sleep at night and eat well.  Anxiety is exacerbated by social triggers - difficulty taking care of grandchildren.  Denies suicidal or homicidal thoughts.   Review of Systems  Per HPI    Objective:   Physical Exam  GENERAL: in no acute distress MSK: left shin - maculopapular rash, erythematous with active bleeding and excoriations in center; surrounded by chronic healing lesion that extends from left anterior shin to posterior ankle.  Full ROM upper and lower extremities.  Normal gait. PSYCH: denies any suicidal or homicidal thoughts     Assessment & Plan:

## 2011-02-17 NOTE — Assessment & Plan Note (Signed)
Wrapped affected leg with unna boot x 1 week. If this is truly prurigo exacerbated by anxiety, I need to better control anxiety and daily panic attacks. Discussed with Dr. Jennette Kettle - will try scheduling low dose Xanax TID. Follow up in one week.

## 2011-02-17 NOTE — Assessment & Plan Note (Signed)
Ativan not controlling anxiety so will change back to Xanax TID scheduled.

## 2011-02-23 ENCOUNTER — Ambulatory Visit (INDEPENDENT_AMBULATORY_CARE_PROVIDER_SITE_OTHER): Payer: Self-pay | Admitting: Family Medicine

## 2011-02-23 ENCOUNTER — Encounter: Payer: Self-pay | Admitting: Family Medicine

## 2011-02-23 DIAGNOSIS — R7989 Other specified abnormal findings of blood chemistry: Secondary | ICD-10-CM

## 2011-02-23 DIAGNOSIS — F411 Generalized anxiety disorder: Secondary | ICD-10-CM

## 2011-02-23 DIAGNOSIS — L280 Lichen simplex chronicus: Secondary | ICD-10-CM

## 2011-02-23 DIAGNOSIS — L281 Prurigo nodularis: Secondary | ICD-10-CM

## 2011-02-23 MED ORDER — SERTRALINE HCL 50 MG PO TABS: ORAL_TABLET | ORAL | Status: DC

## 2011-02-23 NOTE — Patient Instructions (Signed)
Use unna boot for one more week. Return to clinic in one week to remove boot. Start taking sertraline 150 mg by mouth daily. Try taking Xanax by mouth twice a day. I will order blood work today and review results next week. If you have any questions or concerns, please call M.D. Schedule followup appointment with Dr. Domenick Bookbinder in 1 to 2 weeks.

## 2011-02-23 NOTE — Assessment & Plan Note (Signed)
Increase Zoloft to 150 mg by mouth daily. Decrease Xanax from 3 times a day to twice a day. Try this for one week and return to clinic for  Reassessment. Discussed with patient that my goal is to increase SSRI and decrease benzodiazepine use.

## 2011-02-23 NOTE — Progress Notes (Signed)
  Subjective:    Patient ID: Stacy Campbell, female    DOB: 02/12/1955, 56 y.o.   MRN: [id number]  HPI  The patient presents to clinic for follow up pruritus and anxiety.  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  Patient has not been able to scratch rash for one week which has healed open lesions and topped active bleeding. Patient says that she still itches her arms and now that the Unna boot has come off, rash on leg is starting to itch.   Generalized anxiety: Current regimen Zoloft 100 mg daily and Xanax 0.5 mg 3 times a day. Patient says she has had only 2 panic attacks in the last week, which is better than every other day. She is satisfied with the new regimen. She denies any feelings of doom and gloom and/or chest discomfort. She believes that better control of anxiety decreases itching. For sleep, she takes Ambien and Xanax. Denies any excessive drowsiness, hallucinations, thoughts of suicide or homicide.  I have reviewed PMH, Medications, and smoking and alcohol status.  Review of Systems per history of present illness     Objective:   Physical Exam GENERAL: in no acute distress SKIN: removed unna boot and rash on left shin significantly improved, no active bleeding or open sores.  Rash is now dry, skin is cracked and peeling.  Erythema also improved.  2 new superficial lesions on left forearm, scabbed and healing, no active bleeding or signs of infection. NEURO: normal gait        Assessment & Plan:

## 2011-02-23 NOTE — Assessment & Plan Note (Signed)
Anterior shin rash has improved. Will apply Unna boot for one more week. After the boot is removed next week, patient will need to moisturize rash with either Vaseline or hydrocortisone and wrap with Ace bandage or gauze until rash resolves. Advised patient to apply triamcinolone cream to new lesions on left arm. Follow up in one week.

## 2011-02-24 LAB — COMPREHENSIVE METABOLIC PANEL
ALT: 67 U/L — ABNORMAL HIGH (ref 0–35)
AST: 95 U/L — ABNORMAL HIGH (ref 0–37)
Albumin: 3.6 g/dL (ref 3.5–5.2)
Alkaline Phosphatase: 87 U/L (ref 39–117)
Calcium: 9.2 mg/dL (ref 8.4–10.5)
Chloride: 105 mEq/L (ref 96–112)
Potassium: 3.5 mEq/L (ref 3.5–5.3)

## 2011-03-01 ENCOUNTER — Encounter: Payer: Self-pay | Admitting: Family Medicine

## 2011-03-01 ENCOUNTER — Other Ambulatory Visit: Payer: Self-pay | Admitting: Family Medicine

## 2011-03-01 ENCOUNTER — Ambulatory Visit (INDEPENDENT_AMBULATORY_CARE_PROVIDER_SITE_OTHER): Payer: Self-pay | Admitting: Family Medicine

## 2011-03-01 VITALS — BP 128/76 | HR 80 | Temp 98.6°F | Ht 65.0 in | Wt 226.0 lb

## 2011-03-01 DIAGNOSIS — R894 Abnormal immunological findings in specimens from other organs, systems and tissues: Secondary | ICD-10-CM

## 2011-03-01 DIAGNOSIS — R768 Other specified abnormal immunological findings in serum: Secondary | ICD-10-CM

## 2011-03-01 DIAGNOSIS — R748 Abnormal levels of other serum enzymes: Secondary | ICD-10-CM

## 2011-03-01 DIAGNOSIS — L280 Lichen simplex chronicus: Secondary | ICD-10-CM

## 2011-03-01 DIAGNOSIS — L281 Prurigo nodularis: Secondary | ICD-10-CM

## 2011-03-01 DIAGNOSIS — R7401 Elevation of levels of liver transaminase levels: Secondary | ICD-10-CM

## 2011-03-01 NOTE — Assessment & Plan Note (Signed)
ast and alt have decreased from last check but still > 2x normal values with evidence of chronic low level elevation.  Patient reports no alcohol or tylenol use.  Does have daughter who died of hep b liver failure.  Will check hep b, hep c, hiv, tsh today.  Will follow-up with PCP

## 2011-03-01 NOTE — Patient Instructions (Signed)
Use triamcinolone as needed for itching and rash on legs  When it improves just use moisturizer such as Vaseline or Eucerin  Will check your hepatitis and HIV labs today  Keep follow-up with Dr. Tye Savoy to discuss in more detail

## 2011-03-01 NOTE — Progress Notes (Signed)
  Subjective:    Patient ID: Stacy Campbell, female    DOB: Aug 13, 1955, 56 y.o.   MRN: [id number]  HPI Reviewed and examined patient with MS3.  Agree with note.  HPI: here for 1 week follow-up of unna boot placement for prurigo nodularis.  Patient has long history of itchy rash, difficulty controlling itching.     Some itching, no drainage, pain.  Per previous note, patient has CMET for elevated LFT's to review today.  No abdominal pain, nausea, vomiting.  Elevated ast, alt reviewed.  No alcohol use and rare tylenol use   Review of Systems see hpi     Objective:   Physical Exam GEN: NAD Skin:  Hyperpigmented patchy areas on both shins.  No  Open wounds.  No cellulitis       Assessment & Plan:

## 2011-03-01 NOTE — Progress Notes (Signed)
Stacy Campbell is a 56 y.o. 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  Pt reports that she has struggled with this skin condition since about 2007.  Last week, pt's lower left heel and leg were wrapped in an Unna boot to dry out the area.  She reports that during the past week the site has been very itchy.  She reports occasional shooting pains in her foot.  She denies any other pain, swelling, or neuropathy.  She denies any discharge or blood from the area.  She denies fever, chills, n/v/d, CP, palpitations.  Pt reports that she is able to walk fine.   PMH reviewed.  ROS as above otherwise neg Medications reviewed. Current Outpatient Prescriptions  Medication Sig Dispense Refill  . albuterol (VENTOLIN HFA) 108 (90 BASE) MCG/ACT inhaler Inhale 2 puffs into the lungs every 4 (four) hours.        . ALPRAZolam (XANAX) 0.5 MG tablet Take 1 tablet (0.5 mg total) by mouth 3 (three) times daily.  93 tablet  0  . amLODipine (NORVASC) 10 MG tablet Take 10 mg by mouth daily.        Marland Kitchen aspirin (ASPIRIN CHILDRENS) 81 MG chewable tablet Chew 81 mg by mouth daily.        . carvedilol (COREG) 25 MG tablet Take 1 tablet (25 mg total) by mouth 2 (two) times daily. Take 1 pill in the morning, and 1 in the evening  60 tablet  5  . cyclobenzaprine (FLEXERIL) 10 MG tablet Take 1 tablet (10 mg total) by mouth 2 (two) times daily as needed for muscle spasms.  30 tablet  0  . ergocalciferol (VITAMIN D2) 50000 UNITS capsule Take 1 capsule (50,000 Units total) by mouth once a week.  10 capsule  0  . esomeprazole (NEXIUM) 40 MG packet Take 40 mg by mouth daily before breakfast.  30 each  12  . furosemide (LASIX) 20 MG tablet Take 1 tablet (20 mg total) by mouth daily.  31 tablet  3  . hydrochlorothiazide (HYDRODIURIL) 25 MG tablet take 1 tablet by mouth once daily  90 tablet  3  . meclizine (ANTIVERT) 25 MG tablet Take 1 tablet (25 mg total) by mouth 2 (two) times daily.  30  tablet  3  . potassium chloride (K-DUR) 10 MEQ tablet Take 2 tablets (20 mEq total) by mouth daily.  31 tablet  3  . sertraline (ZOLOFT) 100 MG tablet take 1 tablet by mouth at bedtime  34 tablet  11  . sertraline (ZOLOFT) 50 MG tablet Take 1 tablet in addition to Sertraline 100 mg PO daily.  Total: Sertraline 150 mg PO daily.  31 tablet  5  . triamcinolone cream (KENALOG) 0.1 % Apply topically 2 (two) times daily.  30 g  5  . zolpidem (AMBIEN) 10 MG tablet Take 1 tablet (10 mg total) by mouth at bedtime.  30 tablet  3    Exam:  BP 128/76  Pulse 80  Temp(Src) 98.6 F (37 C) (Oral)  Ht 5\' 5"  (1.651 m)  Wt 226 lb (102.513 kg)  BMI 37.61 kg/m2 Gen: Well NAD Lungs: CTABL Nl WOB Heart: RRR no MRG MSK: 1+ pitting edema on BLE, palpable dorsal pedis pulses bilaterally, sensation intact, strength 5/5 in ankle flexion, extension, inversion, eversion bilaterally, gait and balance are intact Skin: left lower leg and heel with plaque with hyperpigmented scar tissue and raised border, minimally flaky, no drainage,  blanches, minimally tender to palpation, no surrounding edema or warmth   Assessment and Plan: Stacy Campbell is a 56 yo F with PMHx of htn who presents for 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  Affected area has healed very well over the past few week since placement of Unna boot.  Do not think that Unna boot is necessary for another week as there are currently no open wounds.  Pt is scheduled to see PCP next week for re-evaluation.  1. Apply triamcinolone cream to area as needed 2. Wash daily with soap and water. Keep area dry and clean. 3. Hepatitis panel, HIV, TSH for further evaluation of elevated LFTs and prurigo nodularis.   Gerlene Fee, MS3

## 2011-03-01 NOTE — Assessment & Plan Note (Signed)
Removed unna boot today.  Advised on using triamcinolone liberally as needed for itching and may return to liberal moisturizers in between to break the itch/scratch cycle.  Will workup elevates lfts', obtain hiv, tsh to rule out underlying reasons for pruritis.

## 2011-03-02 LAB — HEPATITIS B SURFACE ANTIGEN: Hepatitis B Surface Ag: NEGATIVE

## 2011-03-02 LAB — HEPATITIS C ANTIBODY: HCV Ab: REACTIVE — AB

## 2011-03-02 NOTE — Progress Notes (Signed)
Addended by: Macy Mis on: 03/02/2011 09:54 AM   Modules accepted: Orders

## 2011-03-02 NOTE — Assessment & Plan Note (Signed)
Will add RNA quant to lab today

## 2011-03-06 ENCOUNTER — Telehealth: Payer: Self-pay | Admitting: Family Medicine

## 2011-03-06 DIAGNOSIS — B1920 Unspecified viral hepatitis C without hepatic coma: Secondary | ICD-10-CM

## 2011-03-06 LAB — HEPATITIS C RNA QUANTITATIVE
HCV Quantitative Log: 7.07 {Log} — ABNORMAL HIGH (ref <1.63)
HCV Quantitative: 11675557 IU/mL — ABNORMAL HIGH (ref <43)

## 2011-03-06 NOTE — Telephone Encounter (Signed)
Patient returned phone call  Spoke with patient about positive hepatitis c, possible transmission between sex partners and blood.  She is somewhat familiar with hepatitis as she had a daughter her passed from hepatitis b.    I discussed recommendations for consultation with hepatologist top discuss baseline assessment and treatment options.  Patient will think about this and discuss with her PCP need for referral next week.

## 2011-03-06 NOTE — Telephone Encounter (Signed)
Left message to discuss lab results.  I want to discuss hep C.    Active hepatitis C indicated by positive antibody and high RNA.  Elevated LFT's.   Left message on patients voicemail to call back to discuss lab results.  Has follow-up visit with PCP next week for further discussion.  If patient agreeable, would recommend referral to Hepatologist.

## 2011-03-06 NOTE — Progress Notes (Signed)
Addended by: Macy Mis on: 03/06/2011 10:17 AM   Modules accepted: Orders

## 2011-03-06 NOTE — Assessment & Plan Note (Addendum)
Active hepatitis C indicated by positive antibody and high RNA.  Elevated LFT's.   Left message on patients voicemail to call back to discuss lab results.  Has follow-up visit with PCP next week for further discussion.  If patient agreeable, would recommend referral to Hepatologist.

## 2011-03-12 ENCOUNTER — Encounter: Payer: Self-pay | Admitting: Family Medicine

## 2011-03-14 ENCOUNTER — Ambulatory Visit (INDEPENDENT_AMBULATORY_CARE_PROVIDER_SITE_OTHER): Payer: Self-pay | Admitting: Family Medicine

## 2011-03-14 ENCOUNTER — Encounter: Payer: Self-pay | Admitting: Family Medicine

## 2011-03-14 VITALS — BP 157/92 | HR 80 | Temp 98.6°F | Ht 65.0 in | Wt 224.0 lb

## 2011-03-14 DIAGNOSIS — R05 Cough: Secondary | ICD-10-CM

## 2011-03-14 DIAGNOSIS — R059 Cough, unspecified: Secondary | ICD-10-CM

## 2011-03-14 DIAGNOSIS — L280 Lichen simplex chronicus: Secondary | ICD-10-CM

## 2011-03-14 DIAGNOSIS — L281 Prurigo nodularis: Secondary | ICD-10-CM

## 2011-03-14 DIAGNOSIS — B1920 Unspecified viral hepatitis C without hepatic coma: Secondary | ICD-10-CM

## 2011-03-14 MED ORDER — BENZONATATE 100 MG PO CAPS: 100.0000 mg | ORAL_CAPSULE | Freq: Three times a day (TID) | ORAL | Status: AC | PRN

## 2011-03-14 NOTE — Patient Instructions (Signed)
It was good to see you today. Please pick up Tessalon perles at your pharmacy and take as needed for cough. Drink plenty of water and get plenty of rest.  Because your blood tests were positive, I will refer you to [facility]atitis [facility]. Please renew your [facility] card as soon as possible so you can make an appointment. Schedule follow up appointment with me in 3-4 months.

## 2011-03-15 ENCOUNTER — Encounter: Payer: Self-pay | Admitting: Family Medicine

## 2011-03-15 NOTE — Assessment & Plan Note (Signed)
Rash has improved significantly.  Triamcinolone as needed for itching.  Will refer to Hep C Clinic.

## 2011-03-15 NOTE — Assessment & Plan Note (Signed)
Patient agreeable to be seen by Hepatitis Clinic.  Referral in place. Awaiting [facility] card before scheduling appointment. Will need Hep B immunizations at next visit.

## 2011-03-15 NOTE — Assessment & Plan Note (Signed)
Will send Tessalon perles to pharm for cough secondary to URI. Supportive treatments and red flags reviewed.

## 2011-03-15 NOTE — Progress Notes (Signed)
  Subjective:    Patient ID: Stacy Campbell, female    DOB: Dec 14, 1955, 56 y.o.   MRN: [id number]  HPI  Hepatitis C:  Patient does not recall any history of Hep C.  She has 3 tattoos, but seems upset about this new diagnosis.  She is willing to go to Hepatitis C Clinic once she gets her [facility] card.  Denies any jaundice, abdominal pain, changes in BM, nausea/vomiting.  Pruritis: patient says this is improving with Xanax for anxiety and triamcinolone cream.  Unna boot also helped.  Still complains of itching, but not as bad as before.  No active bleeding or drainage from left LE.  Cough: Started one week ago.  Nephew was sick with URI last week.  Cough is productive, but clear phlegm.  Denies any fever, chills, NS, vomiting.  Associated symptoms: congestion, rhinorrhea.  NO change in appetite.  I have reviewed smoking hx, PMH, allergies, medications.  Review of Systems  Per HPI    Objective:   Physical Exam  Constitutional: No distress.  Eyes: Conjunctivae are normal. No scleral icterus.  Cardiovascular: Normal heart sounds.   Pulmonary/Chest: Breath sounds normal.  Abdominal: Soft. She exhibits no distension. There is no tenderness.  Musculoskeletal: She exhibits edema.  Skin: Skin is dry.       Left anterior LE: healing rash from pruritis, no erythema but some hyperpigmentation, no open wounds or active bleeding.  No warmth or swelling.          Assessment & Plan:

## 2011-03-20 ENCOUNTER — Other Ambulatory Visit: Payer: Self-pay | Admitting: Family Medicine

## 2011-03-20 NOTE — Telephone Encounter (Signed)
Refill request

## 2011-03-21 ENCOUNTER — Telehealth: Payer: Self-pay | Admitting: *Deleted

## 2011-03-21 NOTE — Telephone Encounter (Signed)
Informed pt that Rx is up front ready for pick up.Stacy Campbell [place]

## 2011-03-21 NOTE — Telephone Encounter (Signed)
Left message for patient to return my call.  If she calls back,  just needs to be told RX for Ambien is ready and can be picked up at front desk.  Stacy Campbell

## 2011-03-21 NOTE — Telephone Encounter (Signed)
Ambien cannot be e-scribed.  Will leave Rx at front desk.  Please inform patient it is ready for pick up today.

## 2011-04-03 ENCOUNTER — Telehealth: Payer: Self-pay | Admitting: Family Medicine

## 2011-04-03 ENCOUNTER — Other Ambulatory Visit: Payer: Self-pay | Admitting: Family Medicine

## 2011-04-03 MED ORDER — TRAZODONE HCL 50 MG PO TABS: 50.0000 mg | ORAL_TABLET | Freq: Every day | ORAL | Status: AC

## 2011-04-03 NOTE — Telephone Encounter (Signed)
Patient is calling because she has misplaced her Rx for Ambien.  She is hoping Dr. Tye Savoy will allow her to get a new one.  She is also going to contact her pharmacy.

## 2011-04-03 NOTE — Telephone Encounter (Signed)
Please see telephone encounter, routed message to white team.

## 2011-04-03 NOTE — Telephone Encounter (Signed)
Please call patient and let her know that I sent a different sleeping medication to her pharmacy called Trazodone.  Ambien is a controlled substance and I cannot refill it because she lost her prescription.  If trazodone does not help, she will need to schedule an appointment with me next month to discuss insomnia.  Thank you!

## 2011-04-04 MED ORDER — ZOLPIDEM TARTRATE 10 MG PO TABS: 10.0000 mg | ORAL_TABLET | Freq: Every evening | ORAL | Status: DC | PRN

## 2011-04-04 NOTE — Telephone Encounter (Signed)
Please let patient know she can pick up Ambien at front desk, but the pharmacy might not allow her to fill it until April 2.

## 2011-04-04 NOTE — Telephone Encounter (Signed)
LVM informing patient that [name] is up front to be picked up

## 2011-04-04 NOTE — Telephone Encounter (Signed)
I will write another Rx for Ambien to be filled on April 2nd.  I will write for enough to last her until her appointment with me.  In the meantime, she can take Xanax at bedtime for sleep.

## 2011-04-04 NOTE — Telephone Encounter (Signed)
Addended by: Tye Savoy, Faythe Heitzenrater on: 04/04/2011 04:41 PM   Modules accepted: Orders

## 2011-04-19 ENCOUNTER — Ambulatory Visit (INDEPENDENT_AMBULATORY_CARE_PROVIDER_SITE_OTHER): Payer: Self-pay | Admitting: Family Medicine

## 2011-04-19 ENCOUNTER — Encounter: Payer: Self-pay | Admitting: Family Medicine

## 2011-04-19 VITALS — BP 142/78 | HR 68 | Temp 98.2°F | Ht 65.0 in | Wt 225.0 lb

## 2011-04-19 DIAGNOSIS — M545 Low back pain, unspecified: Secondary | ICD-10-CM

## 2011-04-19 MED ORDER — KETOROLAC TROMETHAMINE 60 MG/2ML IM SOLN
60.0000 mg | Freq: Once | INTRAMUSCULAR | Status: AC
  Administered 2011-04-19: 60 mg via INTRAMUSCULAR

## 2011-04-19 MED ORDER — ZOLPIDEM TARTRATE 10 MG PO TABS: 10.0000 mg | ORAL_TABLET | Freq: Every evening | ORAL | Status: DC | PRN

## 2011-04-19 MED ORDER — IBUPROFEN 600 MG PO TABS: 600.0000 mg | ORAL_TABLET | Freq: Three times a day (TID) | ORAL | Status: AC | PRN

## 2011-04-19 MED ORDER — TRAMADOL HCL 50 MG PO TABS: 50.0000 mg | ORAL_TABLET | Freq: Three times a day (TID) | ORAL | Status: AC | PRN

## 2011-04-19 NOTE — Assessment & Plan Note (Signed)
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 Will treat with Ibuprofen 600 mg and Tramadol 50 mg PRN pain. Handout given with low back and hip exercises to strengthen muscles and improve ROM. Follow up as needed.  See AVS.

## 2011-04-19 NOTE — Patient Instructions (Signed)
For low back pain, take Ibuprofen 600 mg every 6 hours as needed for pain. IF pain is severe, take Tramadol 50 mg 1 tablet every 8 hours as needed for pain.  May cause drowsiness so be careful at work or while driving. Please STRETCH every morning and before bedtime.  Follow the stretches below to help improve back pain and range of motion.  Low Back Sprain with Rehab  A sprain is an injury in which a ligament is torn. The ligaments of the lower back are vulnerable to sprains. However, they are strong and require great force to be injured. These ligaments are important for stabilizing the spinal column. Sprains are classified into three categories. Grade 1 sprains cause pain, but the tendon is not lengthened. Grade 2 sprains include a lengthened ligament, due to the ligament being stretched or partially ruptured. With grade 2 sprains there is still function, although the function may be decreased. Grade 3 sprains involve a complete tear of the tendon or muscle, and function is usually impaired. SYMPTOMS   Severe pain in the lower back.   Sometimes, a feeling of a "pop," "snap," or tear, at the time of injury.   Tenderness and sometimes swelling at the injury site.   Uncommonly, bruising (contusion) within 48 hours of injury.   Muscle spasms in the back.  CAUSES  Low back sprains occur when a force is placed on the ligaments that is greater than they can handle. Common causes of injury include:  Performing a stressful act while off-balance.   Repetitive stressful activities that involve movement of the lower back.   Direct hit (trauma) to the lower back.  RISK INCREASES WITH:  Contact sports (football, wrestling).   Collisions (major skiing accidents).   Sports that require throwing or lifting (baseball, weightlifting).   Sports involving twisting of the spine (gymnastics, diving, tennis, golf).   Poor strength and flexibility.   Inadequate protection.   Previous back injury  or surgery (especially fusion).  PREVENTION  Wear properly fitted and padded protective equipment.   Warm up and stretch properly before activity.   Allow for adequate recovery between workouts.   Maintain physical fitness:   Strength, flexibility, and endurance.   Cardiovascular fitness.   Maintain a healthy body weight.  PROGNOSIS  If treated properly, low back sprains usually heal with non-surgical treatment. The length of time for healing depends on the severity of the injury.  RELATED COMPLICATIONS   Recurring symptoms, resulting in a chronic problem.   Chronic inflammation and pain in the low back.   Delayed healing or resolution of symptoms, especially if activity is resumed too soon.   Prolonged impairment.   Unstable or arthritic joints of the low back.  TREATMENT  Treatment first involves the use of ice and medicine, to reduce pain and inflammation. The use of strengthening and stretching exercises may help reduce pain with activity. These exercises may be performed at home or with a therapist. Severe injuries may require referral to a therapist for further evaluation and treatment, such as ultrasound. Your caregiver may advise that you wear a back brace or corset, to help reduce pain and discomfort. Often, prolonged bed rest results in greater harm then benefit. Corticosteroid injections may be recommended. However, these should be reserved for the most serious cases. It is important to avoid using your back when lifting objects. At night, sleep on your back on a firm mattress, with a pillow placed under your knees. If non-surgical treatment is  unsuccessful, surgery may be needed.  MEDICATION   If pain medicine is needed, nonsteroidal anti-inflammatory medicines (aspirin and ibuprofen), or other minor pain relievers (acetaminophen), are often advised.   Do not take pain medicine for 7 days before surgery.   Prescription pain relievers may be given, if your caregiver  thinks they are needed. Use only as directed and only as much as you need.   Ointments applied to the skin may be helpful.   Corticosteroid injections may be given by your caregiver. These injections should be reserved for the most serious cases, because they may only be given a certain number of times.  HEAT AND COLD  Cold treatment (icing) should be applied for 10 to 15 minutes every 2 to 3 hours for inflammation and pain, and immediately after activity that aggravates your symptoms. Use ice packs or an ice massage.   Heat treatment may be used before performing stretching and strengthening activities prescribed by your caregiver, physical therapist, or athletic trainer. Use a heat pack or a warm water soak.  SEEK MEDICAL CARE IF:   Symptoms get worse or do not improve in 2 to 4 weeks, despite treatment.   You develop numbness or weakness in either leg.   You lose bowel or bladder function.   Any of the following occur after surgery: fever, increased pain, swelling, redness, drainage of fluids, or bleeding in the affected area.   New, unexplained symptoms develop. (Drugs used in treatment may produce side effects.)  EXERCISES  RANGE OF MOTION (ROM) AND STRETCHING EXERCISES - Low Back Sprain Most people with lower back pain will find that their symptoms get worse with excessive bending forward (flexion) or arching at the lower back (extension). The exercises that will help resolve your symptoms will focus on the opposite motion.  Your physician, physical therapist or athletic trainer will help you determine which exercises will be most helpful to resolve your lower back pain. Do not complete any exercises without first consulting with your caregiver. Discontinue any exercises which make your symptoms worse, until you speak to your caregiver. If you have pain, numbness or tingling which travels down into your buttocks, leg or foot, the goal of the therapy is for these symptoms to move closer  to your back and eventually resolve. Sometimes, these leg symptoms will get better, but your lower back pain may worsen. This is often an indication of progress in your rehabilitation. Be very alert to any changes in your symptoms and the activities in which you participated in the 24 hours prior to the change. Sharing this information with your caregiver will allow him or her to most efficiently treat your condition. These exercises may help you when beginning to rehabilitate your injury. Your symptoms may resolve with or without further involvement from your physician, physical therapist or athletic trainer. While completing these exercises, remember:   Restoring tissue flexibility helps normal motion to return to the joints. This allows healthier, less painful movement and activity.   An effective stretch should be held for at least 30 seconds.   A stretch should never be painful. You should only feel a gentle lengthening or release in the stretched tissue.  FLEXION RANGE OF MOTION AND STRETCHING EXERCISES: STRETCH - Flexion, Single Knee to Chest   Lie on a firm bed or floor with both legs extended in front of you.   Keeping one leg in contact with the floor, bring your opposite knee to your chest. Hold your leg in place  by either grabbing behind your thigh or at your knee.   Pull until you feel a gentle stretch in your low back. Hold __________ seconds.   Slowly release your grasp and repeat the exercise with the opposite side.  Repeat __________ times. Complete this exercise __________ times per day.  STRETCH - Flexion, Double Knee to Chest  Lie on a firm bed or floor with both legs extended in front of you.   Keeping one leg in contact with the floor, bring your opposite knee to your chest.   Tense your stomach muscles to support your back and then lift your other knee to your chest. Hold your legs in place by either grabbing behind your thighs or at your knees.   Pull both knees  toward your chest until you feel a gentle stretch in your low back. Hold __________ seconds.   Tense your stomach muscles and slowly return one leg at a time to the floor.  Repeat __________ times. Complete this exercise __________ times per day.  STRETCH - Low Trunk Rotation  Lie on a firm bed or floor. Keeping your legs in front of you, bend your knees so they are both pointed toward the ceiling and your feet are flat on the floor.   Extend your arms out to the side. This will stabilize your upper body by keeping your shoulders in contact with the floor.   Gently and slowly drop both knees together to one side until you feel a gentle stretch in your low back. Hold for __________ seconds.   Tense your stomach muscles to support your lower back as you bring your knees back to the starting position. Repeat the exercise to the other side.  Repeat __________ times. Complete this exercise __________ times per day  EXTENSION RANGE OF MOTION AND FLEXIBILITY EXERCISES: STRETCH - Extension, Prone on Elbows   Lie on your stomach on the floor, a bed will be too soft. Place your palms about shoulder width apart and at the height of your head.   Place your elbows under your shoulders. If this is too painful, stack pillows under your chest.   Allow your body to relax so that your hips drop lower and make contact more completely with the floor.   Hold this position for __________ seconds.   Slowly return to lying flat on the floor.  Repeat __________ times. Complete this exercise __________ times per day.  RANGE OF MOTION - Extension, Prone Press Ups  Lie on your stomach on the floor, a bed will be too soft. Place your palms about shoulder width apart and at the height of your head.   Keeping your back as relaxed as possible, slowly straighten your elbows while keeping your hips on the floor. You may adjust the placement of your hands to maximize your comfort. As you gain motion, your hands will  come more underneath your shoulders.   Hold this position __________ seconds.   Slowly return to lying flat on the floor.  Repeat __________ times. Complete this exercise __________ times per day.  RESTING POSITIONS Consider which positions are most painful for you when choosing a resting position. If you have pain with flexion-based activities (sitting, bending, stooping, squatting), choose a position that allows you to rest in a less flexed posture. You would want to avoid curling into a fetal position on your side. If your pain worsens with extension-based activities (prolonged standing, working overhead), avoid resting in an extended position such as sleeping on  your stomach. Most people will find more comfort when they rest with their spine in a more neutral position, neither too rounded nor too arched. Lying on a non-sagging bed on your side with a pillow between your knees, or on your back with a pillow under your knees will often provide some relief. Keep in mind, being in any one position for a prolonged period of time, no matter how correct your posture, can still lead to stiffness. PROPER SITTING POSTURE In order to minimize stress and discomfort on your spine, you must sit with correct posture. Sitting with good posture should be effortless for a healthy body. Returning to good posture is a gradual process. Many people can work toward this most comfortably by using various supports until they have the flexibility and strength to maintain this posture on their own. When sitting with proper posture, your ears will fall over your shoulders and your shoulders will fall over your hips. You should use the back of the chair to support your upper back. Your lower back will be in a neutral position, just slightly arched. You may place a small pillow or folded towel at the base of your lower back for  support.  When working at a desk, create an environment that supports good, upright posture. Without  extra support, muscles tire, which leads to excessive strain on joints and other tissues. Keep these recommendations in mind: CHAIR:  A chair should be able to slide under your desk when your back makes contact with the back of the chair. This allows you to work closely.   The chair's height should allow your eyes to be level with the upper part of your monitor and your hands to be slightly lower than your elbows.  BODY POSITION  Your feet should make contact with the floor. If this is not possible, use a foot rest.   Keep your ears over your shoulders. This will reduce stress on your neck and low back.  INCORRECT SITTING POSTURES  If you are feeling tired and unable to assume a healthy sitting posture, do not slouch or slump. This puts excessive strain on your back tissues, causing more damage and pain. Healthier options include:  Using more support, like a lumbar pillow.   Switching tasks to something that requires you to be upright or walking.   Talking a brief walk.   Lying down to rest in a neutral-spine position.  PROLONGED STANDING WHILE SLIGHTLY LEANING FORWARD  When completing a task that requires you to lean forward while standing in one place for a long time, place either foot up on a stationary 2-4 inch high object to help maintain the best posture. When both feet are on the ground, the lower back tends to lose its slight inward curve. If this curve flattens (or becomes too large), then the back and your other joints will experience too much stress, tire more quickly, and can cause pain. CORRECT STANDING POSTURES Proper standing posture should be assumed with all daily activities, even if they only take a few moments, like when brushing your teeth. As in sitting, your ears should fall over your shoulders and your shoulders should fall over your hips. You should keep a slight tension in your abdominal muscles to brace your spine. Your tailbone should point down to the ground, not  behind your body, resulting in an over-extended swayback posture.  INCORRECT STANDING POSTURES  Common incorrect standing postures include a forward head, locked knees and/or an excessive swayback.  WALKING Walk with an upright posture. Your ears, shoulders and hips should all line-up. PROLONGED ACTIVITY IN A FLEXED POSITION When completing a task that requires you to bend forward at your waist or lean over a low surface, try to find a way to stabilize 3 out of 4 of your limbs. You can place a hand or elbow on your thigh or rest a knee on the surface you are reaching across. This will provide you more stability, so that your muscles do not tire as quickly. By keeping your knees relaxed, or slightly bent, you will also reduce stress across your lower back. CORRECT LIFTING TECHNIQUES DO :  Assume a wide stance. This will provide you more stability and the opportunity to get as close as possible to the object which you are lifting.   Tense your abdominals to brace your spine. Bend at the knees and hips. Keeping your back locked in a neutral-spine position, lift using your leg muscles. Lift with your legs, keeping your back straight.   Test the weight of unknown objects before attempting to lift them.   Try to keep your elbows locked down at your sides in order get the best strength from your shoulders when carrying an object.   Always ask for help when lifting heavy or awkward objects.  INCORRECT LIFTING TECHNIQUES DO NOT:   Lock your knees when lifting, even if it is a small object.   Bend and twist. Pivot at your feet or move your feet when needing to change directions.   Assume that you can safely pick up even a paperclip without proper posture.  Document Released: 12/25/2004 Document Revised: 12/14/2010 Document Reviewed: 04/08/2008 [facility] Patient Information 2012 [place], [place].

## 2011-04-19 NOTE — Progress Notes (Signed)
  Subjective:    Patient ID: Stacy Campbell, female    DOB: 03/13/1955, 56 y.o.   MRN: [id number]  HPI  This is a pleasant 56 year old female who presents to clinic with acute on chronic low back pain.  I have seen her in the past for LBP, but it had improved.  Last week, symptoms started again.  She denies any injury or trauma to lower back.  Pain is located at lumbar spine and radiates to hips and bilateral thighs.  Denies any numbness/tingling/burning sensation of LE.  Denies any bowel or urinary incontinence.  She says "I feel stiff especially in the morning."  Patient has tried OTC Tylenol with minimal relief.  Pain is worse in the morning around 11 AM after she has been at working for few hours.  She works at [employer] at [employer].  Review of Systems  Denies fever, chills, NS, nausea or vomiting.    Objective:   Physical Exam  Constitutional: No distress.  Neck: Normal range of motion. Neck supple.  Pulmonary/Chest: Effort normal and breath sounds normal. She has no wheezes.  Musculoskeletal:       Lumbar back: She exhibits decreased range of motion, tenderness and pain. She exhibits no bony tenderness, no swelling, no edema, no deformity, no laceration and no spasm.       Better with extension, worse with flexion.  Able to lift both legs to 60 degrees only.  Positive SLE bilaterally.  Patient is very stiff and inflexible on exam.  Skin:       Dry, healing rash on LT anterior LE.    Assessment & Plan:

## 2011-04-30 ENCOUNTER — Telehealth: Payer: Self-pay | Admitting: Family Medicine

## 2011-04-30 NOTE — Telephone Encounter (Signed)
Will forward message to MD. Message left on patient's voicemail that we will call her back when MD has responded.

## 2011-04-30 NOTE — Telephone Encounter (Signed)
Pt is stating that the pain pills are not working and wants to know if she can get something stronger.

## 2011-05-02 ENCOUNTER — Telehealth: Payer: Self-pay | Admitting: Family Medicine

## 2011-05-02 DIAGNOSIS — M545 Low back pain, unspecified: Secondary | ICD-10-CM

## 2011-05-02 NOTE — Telephone Encounter (Signed)
Please let patient know I will be back in the office this afternoon and will call her then.  Thanks.

## 2011-05-02 NOTE — Telephone Encounter (Signed)
Returned patient's call today. Patient says that her back pain is still bothersome. She is to call out of work yesterday.  Patient says that high dose Motrin is not helping. She started taking tramadol one tablet every 8 hours, but this provided little relief. I advised patient to start taking tramadol 2 tablets twice a day as needed for pain. If this does not improve her pain, patient should schedule a followup appointment with me. I will also refer patient to physical therapy for long-term exercises and management of chronic pain. Patient agreed and understood plan.   White team, please call patient once she has an appointment scheduled with physical therapy. Thank you.

## 2011-05-02 NOTE — Telephone Encounter (Signed)
Patient is calling back because she hasn't heard back from anyone yet. 

## 2011-05-03 ENCOUNTER — Other Ambulatory Visit: Payer: Self-pay | Admitting: Family Medicine

## 2011-05-03 NOTE — Telephone Encounter (Signed)
Rx ready to be picked up at front desk.  Please inform patient.  Thanks.

## 2011-05-04 ENCOUNTER — Telehealth: Payer: Self-pay | Admitting: *Deleted

## 2011-05-04 NOTE — Telephone Encounter (Signed)
Spoke with patient and informed her that rx up front for pick up 

## 2011-05-18 ENCOUNTER — Ambulatory Visit (INDEPENDENT_AMBULATORY_CARE_PROVIDER_SITE_OTHER): Payer: Self-pay | Admitting: Family Medicine

## 2011-05-18 ENCOUNTER — Ambulatory Visit (HOSPITAL_COMMUNITY)
Admission: RE | Admit: 2011-05-18 | Discharge: 2011-05-18 | Disposition: A | Discharge status: Home / Self Care | Payer: Self-pay | Source: Ambulatory Visit | Attending: Family Medicine | Admitting: Family Medicine

## 2011-05-18 ENCOUNTER — Telehealth: Payer: Self-pay | Admitting: Family Medicine

## 2011-05-18 VITALS — BP 148/82 | HR 82 | Temp 98.2°F | Ht 65.0 in | Wt 225.0 lb

## 2011-05-18 DIAGNOSIS — M7989 Other specified soft tissue disorders: Secondary | ICD-10-CM | POA: Insufficient documentation

## 2011-05-18 DIAGNOSIS — R609 Edema, unspecified: Secondary | ICD-10-CM

## 2011-05-18 DIAGNOSIS — M79605 Pain in left leg: Secondary | ICD-10-CM | POA: Insufficient documentation

## 2011-05-18 MED ORDER — CEPHALEXIN 500 MG PO CAPS: 500.0000 mg | ORAL_CAPSULE | Freq: Four times a day (QID) | ORAL | Status: AC

## 2011-05-18 MED ORDER — KETOROLAC TROMETHAMINE 60 MG/2ML IJ SOLN
60.0000 mg | Freq: Once | INTRAMUSCULAR | Status: AC
  Administered 2011-05-18: 60 mg via INTRAMUSCULAR

## 2011-05-18 MED ORDER — IBUPROFEN 800 MG PO TABS: 800.0000 mg | ORAL_TABLET | Freq: Three times a day (TID) | ORAL | Status: DC

## 2011-05-18 NOTE — Assessment & Plan Note (Addendum)
See pt instructions.  Leading differential: DVT vs superficial phlebitis.  Will send to vascular at [facility] for venus doppler.  Pt given Xarelto 10 day supply card to fill if positive; given Rx's for Keflex and motrin if negative to treat for suspected infection/inflammatory condition.  NEGATIVE FOR DVT --> pt informed to fill abx and motrin Rx's

## 2011-05-18 NOTE — Progress Notes (Addendum)
S: Pt comes in today for SDA for left leg and foot swelling on the inside of her right calf for 4 days (started hurting on Monday).  Patient has no history of DVT. No shortness of breath or chest pain.  Left leg is painful, which started first 4 days ago, then noticed some redness 2 1/2 to 3 days ago.  Has itching sensation as well as a sharp pain in that area.  Feels very tender all of the time,but especially with palpation.  Has been trying to elevate leg, which hasn't seem to make a difference.  Is on her feet all day at work, and is much worse when she gets home at night-- the swelling and pain are both worse.  Denies falls or trauma.  Denies fevers, chills, N/V/D, rhinorrhea, cough, congestion.  Has never had this happen before.  Denies tobacco or estrogen replacement.  Denies long car ride or trip recent.  States she has remained active, no long periods of inactivity.  No known malignancies.    ROS: Per HPI  History  Smoking status  . Former Smoker  Smokeless tobacco  . Former Neurosurgeon  . Quit date: 03/30/2008    O:  Filed Vitals:   05/18/11 1559  BP: 148/82  Pulse: 82  Temp: 98.2 F (36.8 C)    Gen: NAD, uncomfortable but in NAD CV: RRR, no murmur Pulm: CTA bilat, no wheezes or crackles Abd: soft, NT Ext: Warm, left leg with tense edema below the knee; large patch of eczema on lower lateral side of left calf with some serous leakage from central superficial wound (chronic and stable per pt); diffuse erythema below knee on LLE with concentrated erythema over medial mid-calf/shin; +TTP over medial calf/shin and back of calf/shin; pain with movement of knee, no tenderness over knee or ankle; mild warmth over LLE below knee when compared to right; right LE with mild edema of foot, but no erythema; 2+ pedal pulses bilaterally    A/P: 56 y.o. female p/w left lower ext erythema and edema -See problem list -f/u on Monday or Tues

## 2011-05-18 NOTE — Telephone Encounter (Signed)
Called pt to inform her of doppler study that was NEGATIVE for DVT-- pt aware to start keflex and motrin (and told again that abx would be free at [facility])-- asked pt to start this tonight.  Re-discussed red flags for return to ED tonight or over the weekend and importance of close follow up on Monday or Tuesday.

## 2011-05-18 NOTE — Patient Instructions (Addendum)
It was nice to meet you, I'm so sorry it was under these circumstances.  Please go to [facility] and have the ultrasound of your left leg done as soon as you leave here-- they are expecting you. The results of the study (which they will call me with) will determine what we need to do.  It will be 1 of 2 things: 1. If it is a DVT, you will fill the Xarelto medicine (free 10 day supply-- we will figure out how to get you the rest of it next week when Dr. Raymondo Band our pharmacist is here).  You will start taking the medicine TONIGHT-- take 1 pill twice a day. 2. If it is not a DVT,fill the antibiotic prescription that I have given you-- you will take it 4 times per day.  You will also take some high dose motrin to help with the swelling and inflammation.  [facility] HAS THE ANTIBIOTIC FOR FREE (and ibuprofen for $4)  Regardless of what the ultrasound shows, you will need to be seen on Monday or Tuesday here in clinic to make sure you are doing ok.  Please go to the ER over the weekend if you start having high fevers, shortness of breath, chest pain, or your leg gets worse.

## 2011-05-18 NOTE — Telephone Encounter (Signed)
Patient states right leg and foot  is swollen  And painful. Hs red area above the knee. Advised to come to office now.

## 2011-05-18 NOTE — Telephone Encounter (Signed)
States that her right leg is swollen and redness about knee -

## 2011-05-18 NOTE — Progress Notes (Signed)
VASCULAR LAB PRELIMINARY  PRELIMINARY  PRELIMINARY  PRELIMINARY  Left lower extremity venous duplexplex completed.    Preliminary report:  Left leg negative for deep and superficial vein thrombosis.  Vanna Scotland,  RVT 05/18/2011, 6:31 PM

## 2011-05-21 ENCOUNTER — Encounter: Payer: Self-pay | Admitting: Family Medicine

## 2011-05-21 ENCOUNTER — Ambulatory Visit (INDEPENDENT_AMBULATORY_CARE_PROVIDER_SITE_OTHER): Payer: Self-pay | Admitting: Family Medicine

## 2011-05-21 VITALS — BP 166/103 | HR 66 | Ht 65.0 in | Wt 227.0 lb

## 2011-05-21 DIAGNOSIS — M7989 Other specified soft tissue disorders: Secondary | ICD-10-CM

## 2011-05-21 DIAGNOSIS — I10 Essential (primary) hypertension: Secondary | ICD-10-CM

## 2011-05-21 MED ORDER — AMLODIPINE BESYLATE 10 MG PO TABS: 10.0000 mg | ORAL_TABLET | Freq: Every day | ORAL | Status: DC

## 2011-05-21 NOTE — Assessment & Plan Note (Signed)
Patient's  hypertension has worsened after running out of amlodipine. She is currently asymptomatic. Plan to restart amlodipine and follow up with primary care doctors in a few weeks

## 2011-05-21 NOTE — Progress Notes (Signed)
Stacy Campbell is a 56 y.o. female who presents to [facility] today for   1) followup left leg swelling and pain: Was evaluated last week for left leg swelling and pain. Duplex ultrasound of the leg did not show DVT. She was treated with antibiotics for superficial thrombophlebitis versus cellulitis. This is resolved the erythema and much of the pain. She continues to experience some pain in her left calf.  Walking exacerbates her pain. Rest alleviated her pain. She denies any fevers or chills. No radiating pain.  2) hypertension: Patient takes medications listed below. She recently ran out of amlodipine. She denies any chest pain palpitations trouble breathing or significant bilateral lower extremity edema   PMH: Reviewed significant for hypertension History  Substance Use Topics  . 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 Alcohol Use: Not on file   ROS as above  Medications reviewed. Current Outpatient Prescriptions  Medication Sig Dispense Refill  . albuterol (VENTOLIN HFA) 108 (90 BASE) MCG/ACT inhaler Inhale 2 puffs into the lungs every 4 (four) hours.        . ALPRAZolam (XANAX) 0.5 MG tablet Take 1 tablet (0.5 mg total) by mouth 3 (three) times daily as needed for sleep or anxiety.  90 tablet  0  . amLODipine (NORVASC) 10 MG tablet Take 1 tablet (10 mg total) by mouth daily.  30 tablet  6  . aspirin (ASPIRIN CHILDRENS) 81 MG chewable tablet Chew 81 mg by mouth daily.        . carvedilol (COREG) 25 MG tablet Take 1 tablet (25 mg total) by mouth 2 (two) times daily. Take 1 pill in the morning, and 1 in the evening  60 tablet  5  . cephALEXin (KEFLEX) 500 MG capsule Take 1 capsule (500 mg total) by mouth 4 (four) times daily.  40 capsule  0  . cyclobenzaprine (FLEXERIL) 10 MG tablet Take 1 tablet (10 mg total) by mouth 2 (two) times daily as needed for muscle spasms.  30 tablet  0  . ergocalciferol (VITAMIN D2) 50000 UNITS capsule Take  1 capsule (50,000 Units total) by mouth once a week.  10 capsule  0  . esomeprazole (NEXIUM) 40 MG packet Take 40 mg by mouth daily before breakfast.  30 each  12  . furosemide (LASIX) 20 MG tablet Take 1 tablet (20 mg total) by mouth daily.  31 tablet  3  . hydrochlorothiazide (HYDRODIURIL) 25 MG tablet take 1 tablet by mouth once daily  90 tablet  3  . ibuprofen (ADVIL,MOTRIN) 800 MG tablet Take 1 tablet (800 mg total) by mouth every 8 (eight) hours.  30 tablet  0  . meclizine (ANTIVERT) 25 MG tablet Take 1 tablet (25 mg total) by mouth 2 (two) times daily.  30 tablet  3  . potassium chloride (K-DUR) 10 MEQ tablet Take 2 tablets (20 mEq total) by mouth daily.  31 tablet  3  . sertraline (ZOLOFT) 100 MG tablet take 1 tablet by mouth at bedtime  34 tablet  11  . sertraline (ZOLOFT) 50 MG tablet Take 1 tablet in addition to Sertraline 100 mg PO daily.  Total: Sertraline 150 mg PO daily.  31 tablet  5  . triamcinolone cream (KENALOG) 0.1 % Apply topically 2 (two) times daily.  30 g  5  . zolpidem (AMBIEN) 10 MG tablet Take 1 tablet (10 mg total) by mouth at bedtime as needed for sleep.  30 tablet  0  . DISCONTD: amLODipine (NORVASC) 10 MG tablet Take 10 mg by mouth daily.          Exam:  BP 166/103  Pulse 66  Ht 5\' 5"  (1.651 m)  Wt 227 lb (102.967 kg)  BMI 37.77 kg/m2 Gen: Well NAD HEENT: EOMI,  MMM Lungs: CTABL Nl WOB Heart: RRR no soft nonradiating systolic murmur Abd: NABS, NT, ND Exts: Non edematous BL  LE, warm and well perfused. No erythema present.  Left calf is mildly tender to touch. No hair on lower extremities. Pulses are diminished bilaterally in the dorsal pedis and posterior tibialis. Area of hyperpigmentation on the anterior shin  Duplex ultrasound of the left legs does not show any DVTs

## 2011-05-21 NOTE — Assessment & Plan Note (Signed)
Left leg pain that improved with antibiotics. This was likely superficial thrombophlebitis versus cellulitis.  However I do feel that because she has continued pain in the left leg that has some qualities that would indicate claudication is a possibility that arterial ultrasound is reasonable.  Plan to continue antibiotics and obtain an arterial ultrasound of the lower extremity. Plan to followup primary care doctor in a few weeks

## 2011-05-21 NOTE — Patient Instructions (Signed)
Thank you for coming in today. Please finish your antibiotics.  We will schedule an exam of the arteries in your leg to figure out if they are clogged and causing pain and cramps.  Please follow up with Dr. Tye Savoy in about 2-4 weeks.  Call or go to the emergency room if you get worse, have trouble breathing, have chest pains, or palpitations.   Peripheral Vascular Disease  Peripheral vascular disease (PVD) is caused by cholesterol buildup in the arteries. The arteries become narrow or clogged. This makes it hard for blood to flow. It happens most in the legs, but it can occur in other areas of your body. HOME CARE    Quit smoking, if you smoke.   Exercise as told by your doctor.   Follow a low-fat, low-cholesterol diet as told by your doctor.   Control your diabetes, if you have diabetes.   Care for your feet to prevent infection.   Only take medicine as told by your doctor.  GET HELP RIGHT AWAY IF:    You have pain or lose feeling (numbness) in your arms or legs.   Your arms or legs turn cold or blue.   You have redness, warmth, and puffiness (swelling) in your arms or legs.  MAKE SURE YOU:    Understand these instructions.   Will watch your condition.   Will get help right away if you are not doing well or get worse.  Document Released: 03/21/2009 Document Revised: 12/14/2010 Document Reviewed: 03/21/2009 [facility] Patient Information 2012 [place], [place].

## 2011-05-22 ENCOUNTER — Ambulatory Visit: Payer: Self-pay | Admitting: Physical Therapy

## 2011-05-23 ENCOUNTER — Ambulatory Visit (HOSPITAL_COMMUNITY)
Admission: RE | Admit: 2011-05-23 | Discharge: 2011-05-23 | Disposition: A | Discharge status: Home / Self Care | Payer: Self-pay | Source: Ambulatory Visit | Attending: Family Medicine | Admitting: Family Medicine

## 2011-05-23 DIAGNOSIS — I10 Essential (primary) hypertension: Secondary | ICD-10-CM | POA: Insufficient documentation

## 2011-05-23 DIAGNOSIS — L259 Unspecified contact dermatitis, unspecified cause: Secondary | ICD-10-CM | POA: Insufficient documentation

## 2011-05-23 DIAGNOSIS — M79609 Pain in unspecified limb: Secondary | ICD-10-CM | POA: Insufficient documentation

## 2011-05-23 DIAGNOSIS — M7989 Other specified soft tissue disorders: Secondary | ICD-10-CM | POA: Insufficient documentation

## 2011-05-23 NOTE — Progress Notes (Signed)
VASCULAR LAB PRELIMINARY  PRELIMINARY  PRELIMINARY  PRELIMINARY  VASCULAR LAB PRELIMINARY  ARTERIAL  ABI completed:    RIGHT    LEFT    PRESSURE WAVEFORM  PRESSURE WAVEFORM  BRACHIAL 182 Triphasic BRACHIAL 171 Triphasic  DP 184 Triphasic DP 192 Triphasic         PT 210 Triphasic PT 206 Triphasic                  RIGHT LEFT  ABI 1.15 1.13   ABIs and Doppler waveforms are within normal limits  Kristilyn Coltrane D, RVS 05/23/2011, 5:35 PM .

## 2011-06-07 ENCOUNTER — Other Ambulatory Visit: Payer: Self-pay | Admitting: Family Medicine

## 2011-06-11 ENCOUNTER — Telehealth: Payer: Self-pay | Admitting: Family Medicine

## 2011-06-11 ENCOUNTER — Telehealth: Payer: Self-pay | Admitting: *Deleted

## 2011-06-11 MED ORDER — ZOLPIDEM TARTRATE 10 MG PO TABS: 10.0000 mg | ORAL_TABLET | Freq: Every evening | ORAL | Status: DC | PRN

## 2011-06-11 NOTE — Telephone Encounter (Signed)
Will write script and leave at front desk today.  Patient can pick up anytime after 9 AM.

## 2011-06-11 NOTE — Telephone Encounter (Signed)
Pt is needing to speak with nurse - they won't give her the meds b/c they say it's too early

## 2011-06-11 NOTE — Telephone Encounter (Signed)
Called and informed pt that Rx is up front .Stacy Campbell [place]

## 2011-06-11 NOTE — Telephone Encounter (Signed)
Called  patient and pharmacy . The strength of the zolpidem was not on RX. Advised that the strength is 10 mg , one tab at bedtime as needed for sleep.

## 2011-06-13 ENCOUNTER — Encounter: Payer: Self-pay | Admitting: Family Medicine

## 2011-06-13 ENCOUNTER — Ambulatory Visit (INDEPENDENT_AMBULATORY_CARE_PROVIDER_SITE_OTHER): Payer: Self-pay | Admitting: Family Medicine

## 2011-06-13 VITALS — BP 135/85 | HR 81 | Temp 98.4°F | Ht 65.0 in | Wt 225.0 lb

## 2011-06-13 DIAGNOSIS — M79609 Pain in unspecified limb: Secondary | ICD-10-CM

## 2011-06-13 DIAGNOSIS — M79605 Pain in left leg: Secondary | ICD-10-CM

## 2011-06-13 DIAGNOSIS — I10 Essential (primary) hypertension: Secondary | ICD-10-CM

## 2011-06-13 MED ORDER — GABAPENTIN 100 MG PO CAPS: 100.0000 mg | ORAL_CAPSULE | Freq: Three times a day (TID) | ORAL | Status: DC

## 2011-06-13 MED ORDER — LISINOPRIL-HYDROCHLOROTHIAZIDE 20-25 MG PO TABS: 1.0000 | ORAL_TABLET | Freq: Every day | ORAL | Status: DC

## 2011-06-13 NOTE — Patient Instructions (Addendum)
Start taking Gabapentin as directed for one month.  This will help with pain and nerve irritation. Remember you have an appointment with Physical Therapy. Remember to elevate your legs over your heart when you are resting. Schedule follow up appointment with me in 1 month or sooner as needed.  Start taking Lisinopril-HCTZ 1 tablet daily for hypertension.

## 2011-06-13 NOTE — Assessment & Plan Note (Signed)
Skin infection improved after antibiotics.  No erythema but still tender on palpation. Both venous and arterial dopplers were within normal limits. Positive SLR test, pain is likely multi-factorial.   Will start Gabapentin 100 mg TID and follow up in 1 month/ PT scheduled for June 11.

## 2011-06-13 NOTE — Assessment & Plan Note (Signed)
BP well controlled on current regimen, however with peripheral edema, I discontinued Amlodipine 10. Continue Coreg 35 BID and start combination Lisinopril 20-HCTZ 25 daily. Recheck BP in one month.

## 2011-06-13 NOTE — Progress Notes (Signed)
  Subjective:    Patient ID: Stacy Campbell, female    DOB: [birth year], 56 y.o.   MRN: [id number]  HPI  Patient presents to clinic for follow up leg pain and acute on chronic back pain.    Leg pain: Was evaluated 2 weeks ago for left leg swelling and pain. Duplex ultrasound of the leg did not show DVT and ABI were within normal limits.  She was treated with antibiotics for superficial thrombophlebitis versus cellulitis.  Erythema and much of the pain have resolved. She continues to experience some pain in her left calf - describes it as a "charlie horse."  Cramp starts at toes/foot and radiates up to calf and sometimes thigh.  Walking exacerbates her pain. She denies any fevers or chills.   Back pain: Acute on chronic.  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  Located lower mid-back.  Motrin does not relieve pain.  Worse with exertion, better with rest.  Associated with burning sensation lower extremities bilaterally like "needles."  Denies any decreased sensation.  She is obese and trying to walk and eat better.  She has an appointment with PT this month.    Review of Systems  Per HPI    Objective:   Physical Exam  Constitutional: No distress.  HENT:  Head: Normocephalic and atraumatic.  Cardiovascular: Normal rate and regular rhythm.   Murmur heard. Pulmonary/Chest: Effort normal and breath sounds normal.  Musculoskeletal:       Positive SLR bilaterally; decreased ROM bilateral LE, normal sensation  Skin: Skin is dry.       No erythema, induration, or signs of infection at this time.  Skin is dry and cracking - but improved.          Assessment & Plan:

## 2011-06-19 ENCOUNTER — Ambulatory Visit: Payer: Self-pay | Attending: Family Medicine | Admitting: Physical Therapy

## 2011-06-19 DIAGNOSIS — R262 Difficulty in walking, not elsewhere classified: Secondary | ICD-10-CM | POA: Insufficient documentation

## 2011-06-19 DIAGNOSIS — M545 Low back pain, unspecified: Secondary | ICD-10-CM | POA: Insufficient documentation

## 2011-06-19 DIAGNOSIS — R5381 Other malaise: Secondary | ICD-10-CM | POA: Insufficient documentation

## 2011-06-19 DIAGNOSIS — IMO0001 Reserved for inherently not codable concepts without codable children: Secondary | ICD-10-CM | POA: Insufficient documentation

## 2011-06-19 DIAGNOSIS — M25659 Stiffness of unspecified hip, not elsewhere classified: Secondary | ICD-10-CM | POA: Insufficient documentation

## 2011-06-25 ENCOUNTER — Telehealth: Payer: Self-pay | Admitting: Family Medicine

## 2011-06-25 MED ORDER — METHOCARBAMOL 500 MG PO TABS: 500.0000 mg | ORAL_TABLET | Freq: Three times a day (TID) | ORAL | Status: AC

## 2011-06-25 NOTE — Telephone Encounter (Signed)
Citizens Disability forms for be completed by Stacy Campbell.

## 2011-06-25 NOTE — Telephone Encounter (Signed)
Pt is having more and more muscle spasms and is asking for something for this [facility] [employer]

## 2011-06-25 NOTE — Telephone Encounter (Signed)
Please call patient and inform her that a new pain medication, Robaxin, was sent to her pharmacy.  She should STOP taking Flexeril and start Robaxin today.  Thank you.

## 2011-06-26 ENCOUNTER — Ambulatory Visit: Payer: Self-pay

## 2011-07-03 ENCOUNTER — Ambulatory Visit: Payer: Self-pay | Admitting: Physical Therapy

## 2011-07-05 ENCOUNTER — Ambulatory Visit: Payer: Self-pay | Admitting: Physical Therapy

## 2011-07-10 ENCOUNTER — Ambulatory Visit: Payer: Self-pay | Attending: Family Medicine | Admitting: Physical Therapy

## 2011-07-10 DIAGNOSIS — R262 Difficulty in walking, not elsewhere classified: Secondary | ICD-10-CM | POA: Insufficient documentation

## 2011-07-10 DIAGNOSIS — M545 Low back pain, unspecified: Secondary | ICD-10-CM | POA: Insufficient documentation

## 2011-07-10 DIAGNOSIS — M25659 Stiffness of unspecified hip, not elsewhere classified: Secondary | ICD-10-CM | POA: Insufficient documentation

## 2011-07-10 DIAGNOSIS — IMO0001 Reserved for inherently not codable concepts without codable children: Secondary | ICD-10-CM | POA: Insufficient documentation

## 2011-07-10 DIAGNOSIS — R5381 Other malaise: Secondary | ICD-10-CM | POA: Insufficient documentation

## 2011-07-13 ENCOUNTER — Ambulatory Visit: Payer: Self-pay | Admitting: Physical Therapy

## 2011-07-17 ENCOUNTER — Ambulatory Visit: Payer: Self-pay | Admitting: Physical Therapy

## 2011-07-18 ENCOUNTER — Other Ambulatory Visit: Payer: Self-pay | Admitting: Family Medicine

## 2011-07-19 ENCOUNTER — Encounter: Payer: Self-pay | Admitting: Family Medicine

## 2011-07-19 ENCOUNTER — Ambulatory Visit (INDEPENDENT_AMBULATORY_CARE_PROVIDER_SITE_OTHER): Payer: Self-pay | Admitting: Family Medicine

## 2011-07-19 VITALS — BP 131/85 | HR 106 | Temp 98.3°F | Ht 65.0 in | Wt 218.0 lb

## 2011-07-19 DIAGNOSIS — L280 Lichen simplex chronicus: Secondary | ICD-10-CM

## 2011-07-19 DIAGNOSIS — I10 Essential (primary) hypertension: Secondary | ICD-10-CM

## 2011-07-19 DIAGNOSIS — M545 Low back pain, unspecified: Secondary | ICD-10-CM

## 2011-07-19 DIAGNOSIS — M79605 Pain in left leg: Secondary | ICD-10-CM

## 2011-07-19 DIAGNOSIS — G8929 Other chronic pain: Secondary | ICD-10-CM

## 2011-07-19 DIAGNOSIS — L281 Prurigo nodularis: Secondary | ICD-10-CM

## 2011-07-19 DIAGNOSIS — M79609 Pain in unspecified limb: Secondary | ICD-10-CM

## 2011-07-19 LAB — POCT GLYCOSYLATED HEMOGLOBIN (HGB A1C): Hemoglobin A1C: 6.3

## 2011-07-19 MED ORDER — ALPRAZOLAM 0.5 MG PO TABS: 0.5000 mg | ORAL_TABLET | Freq: Three times a day (TID) | ORAL | Status: DC | PRN

## 2011-07-19 MED ORDER — CYCLOBENZAPRINE HCL 5 MG PO TABS: 5.0000 mg | ORAL_TABLET | Freq: Two times a day (BID) | ORAL | Status: AC | PRN

## 2011-07-19 NOTE — Assessment & Plan Note (Signed)
Patient says Tramadol is not working anymore.  She is currently going to PT which is helpful, but still complains of moderate to severe pain after PT sessions.  Will refer to Pain Clinic for co-management.

## 2011-07-19 NOTE — Assessment & Plan Note (Signed)
Stop Gabapentin due to nausea.  Start Flexeril as needed for leg spasms.

## 2011-07-19 NOTE — Assessment & Plan Note (Signed)
BP well-controlled -Continue current regimen 

## 2011-07-19 NOTE — Assessment & Plan Note (Addendum)
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  Continue Triamcinolone cream and antibiotic ointment as needed.  Follow up if rash worsens or if she develops systemic symptoms.

## 2011-07-19 NOTE — Patient Instructions (Addendum)
Pick up Flexeril and take twice a day as needed for muscle spasms.  Do not take with Ativan. Continue physical therapy and exercises at home. We will call you with time and date of pain clinic appointment. Your blood pressure is fine today.  Continue your current medications and avoid salty foods. Schedule follow up appointment with me in 3 months.

## 2011-07-19 NOTE — Progress Notes (Signed)
  Subjective:    Patient ID: Stacy Campbell, female    DOB: [birth year], 56 y.o.   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  She has not applied anything topically, but still has Triamcinolone at home.  She has been taking her Ativan TID.  She denies any fevers, chills, night sweats, leg pain or edema.  Hypertension: BP well controlled after switching from Amlodipine to Lisinopril-HCTZ due to complaints of peripheral edema. Patient denies any headache, changes in vision, SOB, fatigue, numbness or tingling of extremities.  Patient has lost about 7 lbs since last month.  She is working with PT for chronic back pain and has not been eating much.    Low back pain, chronic: Patient has had 2 PT sessions and has been doing exercises at home.  She still complains of pain, especially after PT sessions.   She has tried NSAIDs, Tramadol, Gabapentin, Flexeril and patient states that pain has not improved.  She is willing to be seen at Pain Clinic.  Patient has brought in a disability form for me to complete.  Denies any urinary or bowel incontinence.  Review of Systems  Per HPI    Objective:   Physical Exam  Constitutional: No distress.  Cardiovascular: Normal rate and normal heart sounds.   Pulmonary/Chest: Effort normal and breath sounds normal.  Skin:       RT lower extremity (shin) - erythematous, superficial open wounds consistent with pruritis; no active pus or drainage, about 3.5 cm wide.          Assessment & Plan:

## 2011-07-19 NOTE — Telephone Encounter (Signed)
Citizens Disability Forms faxed to [phone].  Stacy Campbell

## 2011-07-24 ENCOUNTER — Encounter: Payer: Self-pay | Admitting: Physical Therapy

## 2011-07-25 ENCOUNTER — Encounter: Payer: Self-pay | Admitting: Physical Medicine and Rehabilitation

## 2011-07-26 ENCOUNTER — Encounter: Payer: Self-pay | Admitting: Physical Therapy

## 2011-07-26 ENCOUNTER — Ambulatory Visit (INDEPENDENT_AMBULATORY_CARE_PROVIDER_SITE_OTHER): Payer: Self-pay | Admitting: Gastroenterology

## 2011-07-26 DIAGNOSIS — B182 Chronic viral hepatitis C: Secondary | ICD-10-CM

## 2011-07-26 LAB — PROTIME-INR: Prothrombin Time: 14.6 seconds (ref 11.6–15.2)

## 2011-07-26 LAB — CBC WITH DIFFERENTIAL/PLATELET
Hemoglobin: 12.2 g/dL (ref 12.0–15.0)
Lymphs Abs: 1.5 10*3/uL (ref 0.7–4.0)
Monocytes Relative: 10 % (ref 3–12)
Neutro Abs: 1.6 10*3/uL — ABNORMAL LOW (ref 1.7–7.7)
Neutrophils Relative %: 44 % (ref 43–77)
Platelets: 122 10*3/uL — ABNORMAL LOW (ref 150–400)
RBC: 4.82 MIL/uL (ref 3.87–5.11)
WBC: 3.7 10*3/uL — ABNORMAL LOW (ref 4.0–10.5)

## 2011-07-27 LAB — AFP TUMOR MARKER: AFP-Tumor Marker: 29.2 ng/mL — ABNORMAL HIGH (ref 0.0–8.0)

## 2011-07-27 LAB — IBC PANEL
TIBC: 473 ug/dL — ABNORMAL HIGH (ref 250–470)
UIBC: 415 ug/dL — ABNORMAL HIGH (ref 125–400)

## 2011-07-27 LAB — ANTI-NUCLEAR AB-TITER (ANA TITER): ANA Titer 1: 1:160 {titer} — ABNORMAL HIGH

## 2011-07-27 LAB — COMPLETE METABOLIC PANEL WITH GFR
ALT: 94 U/L — ABNORMAL HIGH (ref 0–35)
AST: 132 U/L — ABNORMAL HIGH (ref 0–37)
Albumin: 3.5 g/dL (ref 3.5–5.2)
Alkaline Phosphatase: 72 U/L (ref 39–117)
GFR, Est Non African American: >89 mL/min
Glucose, Bld: 88 mg/dL (ref 70–99)
Potassium: 3.4 mEq/L — ABNORMAL LOW (ref 3.5–5.3)
Sodium: 144 mEq/L (ref 135–145)
Total Protein: 7.1 g/dL (ref 6.0–8.3)

## 2011-07-27 LAB — HEPATITIS B CORE ANTIBODY, TOTAL: Hep B Core Total Ab: POSITIVE — AB

## 2011-07-27 LAB — ANA: Anti Nuclear Antibody(ANA): POSITIVE — AB

## 2011-08-01 LAB — HEPATITIS C GENOTYPE

## 2011-08-02 NOTE — Progress Notes (Signed)
Stacy Campbell, Stacy Campbell    MR#:  [id number]      DATE:  07/26/2011  DOB:  12/19/1955    cc:     referring physician:  Teola Bradley, MD, [facility], [address] [place], [place]  [address], fax [phone].  primary care physician:  Teola Bradley, MD, [facility], [address], [place], [place]  [address], fax [phone].  REASON FOR REFERRAL: Positive HCV RNA.   HISTORY: The patient is a 56 year old woman who I have been asked to see in consultation by Dr. Domenick Bookbinder regarding a positive HCV RNA. It should be noted she was not genotyped prior to her appointment.   According to the patient, she was told that she had abnormal liver tests in the past.  For example, on 12/12/2010 her AST was 217 with an ALT of 137, but stretching even back to 03/18/2007 her liver enzymes were abnormal. I do not see that she was investigated for this until she had on 03/01/2011 a hepatitis C antibody that was positive. She has detectable HCV RNA at 47,425,956 international units per mL on 03/01/2011 but was not genotyped. She has not been biopsied. She remains naive to therapy. There are no symptoms referable to her history of hepatitis C nor are there symptoms to suggest cryoglobulin mediated or decompensated liver disease.   With respect to risk factors for liver disease she denies any significant alcohol use over the course of her lifetime. There is no history of DWIs. She used marijuana in the 1990s only. There is no history of intravenous or intranasal drug use, blood transfusions, or unsterile body piercing. She has 3 tattoos. Her family history is significant for a daughter, Leslieann Whisman, who had fulminant hepatic failure from hepatitis B, who underwent liver transplantation on 12/07/06 but died at just under 11 months due to complications related to an hepatic artery thrombosis with recurrent herpetic abscesses.  Despite her  daughter's diagnosis of hepatitis B induced liver disease, the patient cannot recall being vaccinated against hepatitis B and was surface antibody negative on 03/01/2011. She cannot recall receiving hepatitis A vaccination either.   PAST MEDICAL HISTORY: She reports a history of hypertension, but no history of dyslipidemia, coronary disease or diabetes. The patient reports that she underwent cardioversion in the 1990s due to atrial fibrillation, but cannot recall much more detail than that. She also has a history of obesity.   PAST SURGICAL HISTORY: Denies.   PAST PSYCHIATRIC HISTORY: Reports a history of depression that worsened with the death of her daughter. She reports that she now has more anxiety than depression which is treated by her primary physician. She had never been hospitalized related to depression. There is no history of suicide attempt.   CURRENT MEDICATIONS:  1. Albuterol.  2. Alprazolam 0.5 mg t.i.d. p.r.n.  3. Aspirin 81 mg daily.  4. Carvedilol 25 mg b.i.d.  5. Cyclobenzaprine 5 mg b.i.d.  6. Vitamin D2 50,000 units daily.  7. Furosemide 20 mg daily.  8. Lisinopril/hydrochlorothiazide 20/25 mg daily.  9. Meclizine 25 mg b.i.d.  10. Potassium 20 mEq daily.  11. Sertraline 100 mg at bedtime.  12. Triamcinolone 0.1% topically b.i.d.  13. Zolpidem 10 mg at bedtime.   ALLERGIES: Denies.   HABITS: Smoking 1 cigarette per week. Alcohol as above.   FAMILY HISTORY: As above.   SOCIAL HISTORY: Has 6 children, divorced and is not working.    REVIEW OF SYSTEMS: All 10  systems reviewed today with the patient and are negative other than which is mentioned above. She reports that her exercise tolerance is limited by back pain and not by cardiorespiratory symptoms. Her CES-D was not complete.   PHYSICAL EXAMINATION:  Constitutional:  Central obesity.  Vital signs:  Height 65 inches, weight 221 pounds, blood pressure 144/81, pulse 70, temperature 97.1 Fahrenheit.  Ears, nose,  mouth, and throat: Unremarkable oropharynx. No thyromegaly or neck masses.  Chest: Resonant to percussion. Clear to auscultation.   Cardiovascular: Heart sounds normal S1, S2 without murmurs or rubs. There is no peripheral edema.  Abdominal:  Normal bowel sounds. No masses or tenderness. I could not appreciate a liver edge or spleen tip. I could not appreciate any hernias.  Lymphatics: No cervical or inguinal lymphadenopathy.  CNS: No asterixis or focal neurologic findings.  Dermatologic: Anicteric without palmar erythema or spider angiomata.  Eyes: Anicteric sclerae. Pupils are equal and reactive to light.   LABORATORIES: I reviewed her lab work within [facility]. Other than mentioned above, her HIV antibody on 03/01/2011 was negative.   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 She is naive to therapy. She appears to be a reasonable candidate for treatment, although I have some concerns about her depression history, which is managed by her primary physician. If she turns out to be genotype 1, I think it would be worth having her see our in-house psychologist for at least a screening visit, even though treatment is likely to consist of 12 weeks of Sofosbuvir, interferon and ribavirin for genotype 1. If she is not genotype 1, we will consider a course of non-interferon based therapy when available, possibly later this year.   In my discussion today with the patient we discussed the natural history of hepatitis C.  We discussed the need to genotype her. We discussed the significance of genotype 1 and the possibility of biopsy for genotype 1. We discussed the treatment with pegylated interferon and ribavirin for all genotypes and the addition of a protease inhibitor for genotype 1. We also discussed delaying therapy until the availability of the preliminary inhibitor, which is expected at the end of this year.  She was very much in favor of waiting if we could.  We discussed the possibility of having her screened for depression through Dr. Myrene Buddy as well.   PLAN:  1. Test for hepatitis A immunity.  2. Complete hepatitis B serologies.  3. Genotype.  4. Standard labs otherwise.  5. If genotype 1, would have her screened by Dr. Sander Radon and consider a liver biopsy.  6. If genotype non-1, will consider non-interferon based therapies when available even for genotype 3.  7. Literature on hepatitis C given to the patient.  8. She is to return in January to March 2014 in followup.               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  Hep B surf Ab negative.  ANA 1:160 (globulins 3.6)   Because of the ANA and genotype, will arrange for a biopsy and because genotype 1b, will arrange for a visit with Dr Sander Radon.   [id number]  D:  Thu Jul 18 21:07:45 2013 ; T:  Fri Jul 19 11:36:55 2013  Job #:  [id number]

## 2011-08-03 ENCOUNTER — Encounter: Payer: Self-pay | Attending: Physical Medicine and Rehabilitation | Admitting: Physical Medicine and Rehabilitation

## 2011-08-03 ENCOUNTER — Encounter: Payer: Self-pay | Admitting: Physical Medicine and Rehabilitation

## 2011-08-03 VITALS — BP 152/73 | HR 76 | Resp 16 | Ht 65.0 in | Wt 222.0 lb

## 2011-08-03 DIAGNOSIS — G5600 Carpal tunnel syndrome, unspecified upper limb: Secondary | ICD-10-CM | POA: Insufficient documentation

## 2011-08-03 DIAGNOSIS — M545 Low back pain, unspecified: Secondary | ICD-10-CM | POA: Insufficient documentation

## 2011-08-03 DIAGNOSIS — M47817 Spondylosis without myelopathy or radiculopathy, lumbosacral region: Secondary | ICD-10-CM

## 2011-08-03 DIAGNOSIS — R252 Cramp and spasm: Secondary | ICD-10-CM | POA: Insufficient documentation

## 2011-08-03 DIAGNOSIS — M47816 Spondylosis without myelopathy or radiculopathy, lumbar region: Secondary | ICD-10-CM

## 2011-08-03 DIAGNOSIS — F411 Generalized anxiety disorder: Secondary | ICD-10-CM | POA: Insufficient documentation

## 2011-08-03 DIAGNOSIS — F329 Major depressive disorder, single episode, unspecified: Secondary | ICD-10-CM | POA: Insufficient documentation

## 2011-08-03 DIAGNOSIS — I10 Essential (primary) hypertension: Secondary | ICD-10-CM | POA: Insufficient documentation

## 2011-08-03 DIAGNOSIS — G8929 Other chronic pain: Secondary | ICD-10-CM | POA: Insufficient documentation

## 2011-08-03 DIAGNOSIS — F3289 Other specified depressive episodes: Secondary | ICD-10-CM | POA: Insufficient documentation

## 2011-08-03 NOTE — Patient Instructions (Addendum)
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  Please consider pacing her activities, breaking your activities up into smaller blocks.  Please consider adding the [employer] arthritis pool program 2-3 times per week. Start with 10-15 minutes and add 5 minutes every 2-3 weeks.  I am ordering new a lumbar support to put on while you are doing your home activities such as sweeping, cooking, laundry.  You have had physical therapy which has discussed appropriate body mechanics and posture please review your notes and put these into practice.  For your left leg cramping at night I understand you have some gabapentin, you may try one or 2 pills at night to help with this. Please let me know if the gabapentin causes nausea when you take it just at night. You have told me you have gabapentin 100 mg tablets left at home. You mentioned you have half a bottle left. If these are working for you to help you sleep and your left leg pain, please let me know and I can reordered this for you.   Nonsteroidal anti inflammatory medications such as ibuprofen carry risks. I have discussed these with you. You mentioned you have morning stiffness and you may benefit from some ibuprofen in the morning to help you with this.  We talked about using one or 2 pills from over-the-counter ibuprofen just in the morning with food.  We have also discussed risks and benefits of opioid medication.    We will review your urine screen with you next visit.   Elevated BP today 152/73 follow up with PCP

## 2011-08-03 NOTE — Progress Notes (Addendum)
Subjective:    Patient ID: Stacy Campbell, female    DOB: [birth year], 56 y.o.   MRN: [id number]  HPI  56 yo woman wtih pmh sig for chronic low back pain, prurigo nodularis,hepatitis C,depression,anxiety( 0n alprazolam, sertoline and zolpidem), hypertension,obestiy, hx of cardioversion 1990's secondary to atrial fib.  PCP reports Patient has had 2 PT sessions and has been doing exercises at home. She still complains of pain, especially after PT sessions. She has tried NSAIDs, Tramadol, Gabapentin, Flexeril and patient states that pain has not improved. Patient has brought in a disability form for me to complete. Denies any urinary or bowel incontinence.  The patient's chief complaint is low back pain which is worse with activity. Pain is worse in the morning when she first wakes up when she feels significant stiffness. Pain is worse again when she is active in the home doing laundry sleeping or lifting.  She has had some physical therapy and tells me she understands proper body mechanics and posture. She has not had much education regarding pacing her activities/breaking her tasks up into smaller blocks.  She is not engaged in any regular exercise. She says she does sometimes do some leg lifts the therapist recently gave her.  She is currently taking care of a 47 and a 56 year old which are children from her deceased daughter.  She has been on tramadol and doesn't feel it helps. She is requesting hydrocondone today  She also reports some leg cramping especially in the left leg typically at night. She states she has been on gabapentin and thinks it may have caused some nausea but this may be also attributed to maybe other medications or unrelated to the gabapentin itself. She is willing to retrial this medication starting at night only.  She reports no numbness or tingling in the lower extremities. She does report occasional numbness and tingling in the right hand but this is infrequent,  she has noted occasional weakness in the hand on the right.  Overall she's fairly sedentary and  does not drive.       Pain Inventory Average Pain 9 Pain Right Now 9 My pain is constant, sharp and aching  In the last 24 hours, has pain interfered with the following? General activity 9 Relation with others 9 Enjoyment of life 9 What TIME of day is your pain at its worst? all the time Sleep (in general) Fair  Pain is worse with: walking, bending, sitting, standing and some activites Pain improves with: rest and medication Relief from Meds: 7  Mobility walk with assistance how many minutes can you walk? 10 ability to climb steps?  no do you drive?  no needs help with transfers  Function disabled: date disabled 2009 I need assistance with the following:  bathing, household duties and shopping  Neuro/Psych weakness spasms dizziness depression anxiety  Prior Studies x-rays  Physicians involved in your care Primary care Dr Tye Savoy   Family History  Problem Relation Age of Onset  . Asthma Mother   . Diabetes Mother   . Asthma Father   . Diabetes Father   . Cancer Father    History   Social History  . Marital Status: Single    Spouse Name: N/A    Number of Children: N/A  . Years of Education: N/A   Social History Main Topics  . 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 Alcohol Use: None  .  Drug Use: None  . Sexually Active: None   Other Topics Concern  . None   Social History Narrative  . None   History reviewed. No pertinent past surgical history. Past Medical History  Diagnosis Date  . Hypertension   . Depression   . Anxiety    BP 152/73  Pulse 76  Resp 16  Ht 5\' 5"  (1.651 m)  Wt 222 lb (100.699 kg)  BMI 36.94 kg/m2  SpO2 98%     Review of Systems  Gastrointestinal: Positive for abdominal pain.  Musculoskeletal: Positive for myalgias, back pain, arthralgias and gait  problem.  Neurological: Positive for weakness.  Psychiatric/Behavioral: Positive for dysphoric mood. The patient is nervous/anxious.   All other systems reviewed and are negative.       Objective:   Physical Exam Physical Exam Skin: multiple, firm, pruritic nodules plaques especially over left tibial region.  Cranial nerves are grossly intact. Coordination is grossly intact.  Reflexes are 2+ in the upper extremities and 2+ in the lower extremities. Hoffman sign is negative. Babinski is downgoing. No clonus is noted.  Sensation is intact in left upper extremity decreased over median distribution in right hand. Sensation is intact in lower extremities.  Motor strength is intact in upper and lower extremities with the exception of right thumb abductor.  Gait is stable. Tandem gait and Romberg test are performed adequately. Patient is able to toe walk as well as heel walk briefly.  Lumbar motion is minimally restricted especially with forward flexion where she complains of some discomfort at end range. Lumbar extension does not bother her particularly today.  Internal and external rotation at the hips does not aggravate posterior hip pain or groin pain.  Tenderness in lumbar paraspinal muscles especially in the lower lumbar paraspinal musculature noted.  Clinical Data: Chronic low back pain. No injury.  LUMBAR SPINE - COMPLETE 4+ VIEW  Comparison: 05/01/2006.  Findings: Bilateral L5-S1 facet joint degenerative changes with  slightly elongated pars.  Mild L3-4 and minimal L4-5 disc space narrowing.  Mild vascular calcifications. Small sclerotic focus projects over  T12 vertebra unchanged.  IMPRESSION:  Minimal progression of degenerative changes. Clinical Data: Back pain. Hip pain. Back pain radiating to the legs.  AP PELVIS WITH HIPS BILATERAL - 5 VIEW:  Findings: There are no erosive or destructive changes and there is no evidence for an occult fracture. The joint space appears  normally maintained.  IMPRESSION:  Negative views of the hips.  LUMBAR SPINE - 5 VIEW:  Findings: There are five lumbar type vertebrae present. Facet joint arthritic changes are noted bilaterally at the L5-S1 level. There is minimal L3-4 interspace narrowing present. There are no subluxation or destructive changes and there is no evidence for an occult fracture.  IMPRESSION:  Bilateral L5-S1 facet joint arthritic changes. Minimal L3-4 degenerative disc space narrowing. Otherwise negative study.  Provider: Rainey Pines         Assessment & Plan:  1. Chronic low back pain with known mild arthritic changes of lumbar spine.    2. Mild right carpal tunnel symptoms. Would consider night splint or thumb spica for day is symptoms worsen however these are not actually bothersome for her at this time. She does have some mild weakness however she declined treatment at this time.   3. Left leg cramping especially at night. Retrial gabapentin at hs only.   We spent approximately 30 minutes discussing treatment options for her low back.   We discussed  the importance of staying active.   Also pacing activities is very important.   We also discussed the importance of using proper body mechanics during activities.   Would like to see her involved in a [employer] arthritis pool program.   I will order a lumbar support for her for activities during the day.   She may benefit from 1 dose of ibuprofen in the morning for her morning stiffness.   She has some gabapentin left at home I would like her to retry this just at night to see if this can help some of her left leg pain and cramping which occurs typically at night.  We discussed the risk and benefits of hydrocondone as well today.    BP 152/73 follow up with PCP, recheck BP.    Addendum(:09/05/2011)   UDS inconsistant.  Oxycodone present. [place] controlled substance report accessed and there is no record oxycodone  prescription in last year.  Will discuss with patient next visit.

## 2011-08-08 ENCOUNTER — Other Ambulatory Visit: Payer: Self-pay | Admitting: Radiology

## 2011-08-09 ENCOUNTER — Other Ambulatory Visit: Payer: Self-pay | Admitting: Family Medicine

## 2011-08-10 ENCOUNTER — Encounter (HOSPITAL_COMMUNITY): Payer: Self-pay | Admitting: Pharmacy Technician

## 2011-08-15 ENCOUNTER — Ambulatory Visit (HOSPITAL_COMMUNITY)
Admission: RE | Admit: 2011-08-15 | Discharge: 2011-08-15 | Disposition: A | Discharge status: Home / Self Care | Payer: Self-pay | Source: Ambulatory Visit | Attending: Gastroenterology | Admitting: Gastroenterology

## 2011-08-15 ENCOUNTER — Encounter (HOSPITAL_COMMUNITY): Payer: Self-pay

## 2011-08-15 VITALS — BP 140/77 | HR 58 | Temp 97.4°F | Resp 16 | Ht 65.0 in | Wt 222.0 lb

## 2011-08-15 DIAGNOSIS — K7689 Other specified diseases of liver: Secondary | ICD-10-CM | POA: Insufficient documentation

## 2011-08-15 DIAGNOSIS — I10 Essential (primary) hypertension: Secondary | ICD-10-CM | POA: Insufficient documentation

## 2011-08-15 DIAGNOSIS — K8020 Calculus of gallbladder without cholecystitis without obstruction: Secondary | ICD-10-CM | POA: Insufficient documentation

## 2011-08-15 DIAGNOSIS — B182 Chronic viral hepatitis C: Secondary | ICD-10-CM | POA: Insufficient documentation

## 2011-08-15 DIAGNOSIS — Z79899 Other long term (current) drug therapy: Secondary | ICD-10-CM | POA: Insufficient documentation

## 2011-08-15 DIAGNOSIS — Z7982 Long term (current) use of aspirin: Secondary | ICD-10-CM | POA: Insufficient documentation

## 2011-08-15 LAB — CBC
HCT: 39.9 % (ref 36.0–46.0)
Hemoglobin: 13.1 g/dL (ref 12.0–15.0)
RBC: 5.08 MIL/uL (ref 3.87–5.11)
WBC: 4.2 10*3/uL (ref 4.0–10.5)

## 2011-08-15 LAB — PROTIME-INR
INR: 1.08 (ref 0.00–1.49)
Prothrombin Time: 14.2 seconds (ref 11.6–15.2)

## 2011-08-15 MED ORDER — MIDAZOLAM HCL 5 MG/5ML IJ SOLN
INTRAMUSCULAR | Status: AC | PRN
  Administered 2011-08-15: 2 mg via INTRAVENOUS

## 2011-08-15 MED ORDER — SODIUM CHLORIDE 0.9 % IV SOLN
Freq: Once | INTRAVENOUS | Status: AC
  Administered 2011-08-15: 09:00:00 via INTRAVENOUS

## 2011-08-15 MED ORDER — MIDAZOLAM HCL 2 MG/2ML IJ SOLN
INTRAMUSCULAR | Status: AC
  Filled 2011-08-15: qty 6

## 2011-08-15 MED ORDER — FENTANYL CITRATE 0.05 MG/ML IJ SOLN
INTRAMUSCULAR | Status: AC
  Filled 2011-08-15: qty 6

## 2011-08-15 MED ORDER — FENTANYL CITRATE 0.05 MG/ML IJ SOLN
INTRAMUSCULAR | Status: AC | PRN
  Administered 2011-08-15 (×2): 100 ug via INTRAVENOUS

## 2011-08-15 NOTE — H&P (Signed)
Stacy Campbell is an 56 y.o. female.   Chief Complaint: "I'm here for a liver biopsy" HPI: Patient with history of hepatitis C presents today for US guided random core liver biopsy.  Past Medical History  Diagnosis Date  . Hypertension   . Depression   . Anxiety     History reviewed. No pertinent past surgical history.  Family History  Problem Relation Age of Onset  . Asthma Mother   . Diabetes Mother   . Asthma Father   . Diabetes Father   . Cancer Father    Social History:  reports that she has quit smoking. She quit smokeless tobacco use about 3 years ago. Her alcohol and drug histories not on file.  Allergies: No Known Allergies  Current outpatient prescriptions:albuterol (VENTOLIN HFA) 108 (90 BASE) MCG/ACT inhaler, Inhale 2 puffs into the lungs every 4 (four) hours as needed. Wheezing, Disp: , Rfl: ;  ALPRAZolam (XANAX) 0.5 MG tablet, Take 1 tablet (0.5 mg total) by mouth 3 (three) times daily as needed for sleep or anxiety., Disp: 90 tablet, Rfl: 0;  aspirin (ASPIRIN CHILDRENS) 81 MG chewable tablet, Chew 81 mg by mouth daily before breakfast. , Disp: , Rfl:  carvedilol (COREG) 25 MG tablet, Take 1 tablet (25 mg total) by mouth 2 (two) times daily. Take 1 pill in the morning, and 1 in the evening, Disp: 60 tablet, Rfl: 5;  cyclobenzaprine (FLEXERIL) 5 MG tablet, Take 5 mg by mouth 3 (three) times daily as needed. Muscle spasm, Disp: , Rfl: ;  furosemide (LASIX) 20 MG tablet, Take 20 mg by mouth at bedtime., Disp: , Rfl:  gabapentin (NEURONTIN) 100 MG capsule, Take 100 mg by mouth 3 (three) times daily., Disp: , Rfl: ;  lisinopril-hydrochlorothiazide (PRINZIDE,ZESTORETIC) 20-25 MG per tablet, Take 1 tablet by mouth daily before breakfast., Disp: , Rfl: ;  meclizine (ANTIVERT) 25 MG tablet, Take 1 tablet (25 mg total) by mouth 2 (two) times daily., Disp: 30 tablet, Rfl: 3 potassium chloride (K-DUR) 10 MEQ tablet, Take 2 tablets (20 mEq total) by mouth daily., Disp: 31 tablet,  Rfl: 3;  sertraline (ZOLOFT) 100 MG tablet, take 1 tablet by mouth at bedtime, Disp: 34 tablet, Rfl: 11;  triamcinolone cream (KENALOG) 0.1 %, Apply topically 2 (two) times daily., Disp: 30 g, Rfl: 5;  zolpidem (AMBIEN) 10 MG tablet, Take 1 tablet (10 mg total) by mouth at bedtime as needed for sleep., Disp: 30 tablet, Rfl: 3 ergocalciferol (VITAMIN D2) 50000 UNITS capsule, Take 1 capsule (50,000 Units total) by mouth once a week., Disp: 10 capsule, Rfl: 0 Current facility-administered medications:0.9 %  sodium chloride infusion, , Intravenous, Once, Brayton El, PA, Last Rate: 20 mL/hr at 08/15/11 0830;  fentaNYL (SUBLIMAZE) 0.05 MG/ML injection, , , , ;  midazolam (VERSED) 2 MG/2ML injection, , , ,    Results for orders placed during the hospital encounter of 08/15/11 (from the past 48 hour(s))  APTT     Status: Normal   Collection Time   08/15/11  8:13 AM      Component Value Range Comment   aPTT 34  24 - 37 seconds   CBC     Status: Abnormal   Collection Time   08/15/11  8:13 AM      Component Value Range Comment   WBC 4.2  4.0 - 10.5 K/uL    RBC 5.08  3.87 - 5.11 MIL/uL    Hemoglobin 13.1  12.0 - 15.0 g/dL    HCT 39.9  36.0 - 46.0 %    MCV 78.5  78.0 - 100.0 fL    MCH 25.8 (*) 26.0 - 34.0 pg    MCHC 32.8  30.0 - 36.0 g/dL    RDW 40.9  81.1 - 91.4 %    Platelets 110 (*) 150 - 400 K/uL   PROTIME-INR     Status: Normal   Collection Time   08/15/11  8:13 AM      Component Value Range Comment   Prothrombin Time 14.2  11.6 - 15.2 seconds    INR 1.08  0.00 - 1.49    No results found.  Review of Systems  Constitutional: Negative for fever and chills.  Respiratory: Negative for cough and shortness of breath.   Cardiovascular: Negative for chest pain.  Gastrointestinal: Negative for nausea, vomiting and abdominal pain.  Musculoskeletal: Positive for back pain.  Neurological: Positive for headaches.  Endo/Heme/Allergies: Does not bruise/bleed easily.    Blood pressure 160/95, pulse  71, temperature 97.2 F (36.2 C), temperature source Oral, resp. rate 18, height 5\' 5"  (1.651 m), weight 222 lb (100.699 kg), SpO2 98.00%. Physical Exam  Constitutional: She is oriented to person, place, and time. She appears well-developed and well-nourished.  Cardiovascular: Normal rate and regular rhythm.   Respiratory: Effort normal and breath sounds normal.  GI: Soft. Bowel sounds are normal. There is no tenderness.       obese  Musculoskeletal: Normal range of motion. She exhibits no edema.  Neurological: She is alert and oriented to person, place, and time.     Assessment/Plan Patient with history of hepatitis C; plan is for US guided random core liver biopsy. Details/risks of procedure d/w pt/daughter with their understanding and consent.  ALLRED,D KEVIN 08/15/2011, 9:14 AM

## 2011-08-15 NOTE — Procedures (Signed)
Technically successful US guided random biopsy of the left lobe of the liver.  No immediate complications.

## 2011-08-20 ENCOUNTER — Encounter: Payer: Self-pay | Attending: Physical Medicine and Rehabilitation | Admitting: Physical Medicine and Rehabilitation

## 2011-08-20 ENCOUNTER — Encounter: Payer: Self-pay | Admitting: Physical Medicine and Rehabilitation

## 2011-08-20 VITALS — BP 141/83 | HR 79 | Resp 14 | Ht 65.0 in | Wt 221.0 lb

## 2011-08-20 DIAGNOSIS — R252 Cramp and spasm: Secondary | ICD-10-CM

## 2011-08-20 DIAGNOSIS — F3289 Other specified depressive episodes: Secondary | ICD-10-CM | POA: Insufficient documentation

## 2011-08-20 DIAGNOSIS — M545 Low back pain, unspecified: Secondary | ICD-10-CM

## 2011-08-20 DIAGNOSIS — F329 Major depressive disorder, single episode, unspecified: Secondary | ICD-10-CM | POA: Insufficient documentation

## 2011-08-20 DIAGNOSIS — I10 Essential (primary) hypertension: Secondary | ICD-10-CM | POA: Insufficient documentation

## 2011-08-20 DIAGNOSIS — G8929 Other chronic pain: Secondary | ICD-10-CM | POA: Insufficient documentation

## 2011-08-20 DIAGNOSIS — F411 Generalized anxiety disorder: Secondary | ICD-10-CM | POA: Insufficient documentation

## 2011-08-20 DIAGNOSIS — G5600 Carpal tunnel syndrome, unspecified upper limb: Secondary | ICD-10-CM | POA: Insufficient documentation

## 2011-08-20 NOTE — Progress Notes (Addendum)
Subjective:    Patient ID: Stacy Campbell, female    DOB: 02-16-1955, 56 y.o.   MRN: [id number]  HPI  56 yo woman wtih a medical history significant for chronic low back pain, prurigo nodularis,hepatitis C,depression,anxiety( 0n alprazolam, sertoline and zolpidem), hypertension,obestiy, hx of cardioversion 1990's secondary to atrial fib.   Underwent recent liver biopsy earlier this month. 08/15/11. Reports doing well, no problems, needs to call for follow up appt.   Prior to initial visit here patient has had 2 PT sessions and has been doing exercises at home. She still complains of pain, especially after PT sessions. She has tried NSAIDs, Tramadol, Gabapentin, Flexeril and patient states that pain has not improved. Patient has brought in a disability form for me to complete.   Denies any urinary or bowel incontinence.   The patient's chief complaint is low back pain which is worse with activity. Pain is worse in the morning when she first wakes up when she feels significant stiffness. Pain is worse again when she is active in the home doing laundry sleeping or lifting.    She has had some physical therapy and tells me she understands proper body mechanics and posture.  She has not had much education regarding pacing her activities/breaking her tasks up into smaller blocks.  She is not engaged in any regular exercise. She says she does sometimes do some leg lifts the therapist recently gave her.    She is currently taking care of a 63 and a 56 year old which are children from her deceased daughter.  She has been on tramadol and doesn't feel it helps.   She is requesting hydrocondone today.    She also reports some leg cramping especially in the left leg typically at night. She states she has been on gabapentin and thinks it may have caused some nausea but this may be also attributed to maybe other medications or unrelated to the gabapentin itself. She is willing to retrial this  medication starting at night only.    She reports no numbness or tingling in the lower extremities. She does report occasional numbness and tingling in the right hand but this is infrequent, she has noted occasional weakness in the hand on the right.    Overall she's fairly sedentary and does not drive.    Pain Inventory Average Pain 8 Pain Right Now 8 My pain is sharp and aching  In the last 24 hours, has pain interfered with the following? General activity 7 Relation with others 3 Enjoyment of life 10 What TIME of day is your pain at its worst? all the time Sleep (in general) Fair  Pain is worse with: walking, bending, sitting, inactivity, standing and some activites Pain improves with: rest, therapy/exercise and pacing activities Relief from Meds: 8  Mobility how many minutes can you walk? 2 min ability to climb steps?  no do you drive?  no needs help with transfers  Function not employed: date last employed 05/2011  Neuro/Psych trouble walking spasms dizziness depression anxiety  Prior Studies Any changes since last visit?  no  Physicians involved in your care Any changes since last visit?  no   Family History  Problem Relation Age of Onset  . Asthma Mother   . Diabetes Mother   . Asthma Father   . Diabetes Father   . Cancer Father    History   Social History  . Marital Status: Single    Spouse Name: N/A    Number of Children:  N/A  . Years of Education: N/A   Social History Main Topics  . 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 Alcohol Use: None  . Drug Use: None  . Sexually Active: None   Other Topics Concern  . None   Social History Narrative  . None   History reviewed. No pertinent past surgical history. Past Medical History  Diagnosis Date  . Hypertension   . Depression   . Anxiety    BP 141/83  Pulse 79  Resp 14  Ht 5\' 5"  (1.651 m)  Wt 221 lb (100.245 kg)  BMI 36.78  kg/m2  SpO2 98%     Review of Systems  Constitutional: Positive for fever, chills, diaphoresis, appetite change and unexpected weight change.  HENT: Positive for neck stiffness.   Respiratory: Positive for wheezing.   Musculoskeletal: Positive for myalgias, back pain, arthralgias and gait problem.  Psychiatric/Behavioral: Positive for dysphoric mood. The patient is nervous/anxious.   All other systems reviewed and are negative.       Objective:   Physical Exam  Skin: multiple, firm, pruritic nodules plaques especially over left tibial region.   Scaly patches over both elbows and some hand involvement as well.    Cranial nerves are grossly intact.  Coordination is grossly intact.  Reflexes are 2+ in the upper extremities and 2+ in the lower extremities. Hoffman sign is negative.   Babinski is downgoing. No clonus is noted.   Sensation is intact in left upper extremity decreased over median distribution in right hand. Sensation is intact in lower extremities.   Motor strength is intact in upper and lower extremities with the exception of right thumb abductor.   Gait is stable. Tandem gait and Romberg test are performed adequately. Patient is able to toe walk as well as heel walk briefly.    Lumbar motion is minimally restricted especially with forward flexion where she complains of some discomfort at end range. Lumbar extension does not bother her particularly today.   Internal and external rotation at the hips does not aggravate posterior hip pain or groin pain.   Tenderness in lumbar paraspinal muscles especially in the lower lumbar paraspinal musculature noted.   Clinical Data: Chronic low back pain. No injury.  LUMBAR SPINE - COMPLETE 4+ VIEW  Comparison: 05/01/2006.  Findings: Bilateral L5-S1 facet joint degenerative changes with  slightly elongated pars.  Mild L3-4 and minimal L4-5 disc space narrowing.  Mild vascular calcifications. Small sclerotic focus  projects over  T12 vertebra unchanged.  IMPRESSION:  Minimal progression of degenerative changes.  Clinical Data: Back pain. Hip pain. Back pain radiating to the legs.  AP PELVIS WITH HIPS BILATERAL - 5 VIEW:  Findings: There are no erosive or destructive changes and there is no evidence for an occult fracture. The joint space appears normally maintained.  IMPRESSION:  Negative views of the hips.  LUMBAR SPINE - 5 VIEW:  Findings: There are five lumbar type vertebrae present. Facet joint arthritic changes are noted bilaterally at the L5-S1 level. There is minimal L3-4 interspace narrowing present. There are no subluxation or destructive changes and there is no evidence for an occult fracture.  IMPRESSION:  Bilateral L5-S1 facet joint arthritic changes. Minimal L3-4 degenerative disc space narrowing. Otherwise negative study.  Provider: Rainey Pines        Assessment & Plan:  1. Chronic low back pain with known mild arthritic changes of lumbar  spine.    2. Mild left carpal tunnel symptoms. Would consider night splint or thumb spica for day is symptoms worsen however these are not actually bothersome for her at this time. She does have some mild weakness however she declined treatment at this time.   Says she wants to wait until it bothers her more before getting brace.   3. Left leg cramping especially at night. Gabapentin has helped this problem.  Takes 100mg  tid.  4. Psoriasis? Scaly patches over elbows and some hand involvement.  Consider OT and hand radiographs if she becomes more symptomatic.   We spent approximately 30 minutes discussing treatment options for her low back.    We discussed the importance of staying active.   Also pacing activities is very important.   We also discussed the importance of using proper body mechanics during activities.   Would like to see her involved in a [employer] arthritis pool program.  I have re-emphasized this option  again and she seems open to this.    I will order a lumbar support for her for activities during the day.   Minimize use of opioids. Patient is already on multiple medications which can cause drowsiness  including xanax,ambien,flexeril and neurontin.  Encourage activity and weight loss.   Gabapentin has helped her leg cramps at night.  Follow up in 3 months.

## 2011-08-20 NOTE — Patient Instructions (Addendum)
We discussed treatment options for your low back today which include: Using proper body mechanics and posture during functional activities, use a flexible back brace when engaged in home activities,arthritis pool program to work on lower extremity strength, endurance, flexibility and weight loss  Use topical agents such as icy hot on a when necessary basis  Use heat or cold packs on a when necessary basis for flareups  You mentioned you are not interested in further physical therapy at this time   We discussed considering a brace for your left wrist. Let me know if you would like me to order this.  I will see you back in 3 months.

## 2011-08-23 ENCOUNTER — Encounter: Payer: Self-pay | Admitting: Gastroenterology

## 2011-08-24 ENCOUNTER — Ambulatory Visit (INDEPENDENT_AMBULATORY_CARE_PROVIDER_SITE_OTHER): Payer: Self-pay | Admitting: Family Medicine

## 2011-08-24 ENCOUNTER — Encounter: Payer: Self-pay | Admitting: Family Medicine

## 2011-08-24 VITALS — BP 164/90 | HR 69 | Temp 98.2°F | Ht 65.0 in | Wt 222.3 lb

## 2011-08-24 DIAGNOSIS — M545 Low back pain, unspecified: Secondary | ICD-10-CM

## 2011-08-24 DIAGNOSIS — L281 Prurigo nodularis: Secondary | ICD-10-CM

## 2011-08-24 DIAGNOSIS — L280 Lichen simplex chronicus: Secondary | ICD-10-CM

## 2011-08-24 MED ORDER — DICLOFENAC SODIUM 1 % TD GEL: 1.0000 "application " | Freq: Four times a day (QID) | TRANSDERMAL | Status: DC

## 2011-08-24 MED ORDER — TRAMADOL HCL 50 MG PO TABS: 100.0000 mg | ORAL_TABLET | Freq: Two times a day (BID) | ORAL | Status: AC | PRN

## 2011-08-24 NOTE — Progress Notes (Signed)
  Subjective:    Patient ID: Stacy Campbell, female    DOB: [birth year], 56 y.o.   MRN: [id number]  HPI  Low back pain: Patient has been seen by pain clinic.  She was started on gabapentin which has helped at that time. Patient complains of low back pain is not relieved by tramadol.  She has completed physical therapy, which was helpful at the time but now she says pain is returning. Continues to do exercises at home.  Weight has been stable, but she has not been exercising due to pain.  Patient denies any numbness or tingling of her lower extremities. Denies any loss of bowel or bladder function. Denies any current falls.  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  Currently new lesions on palmar surfaces of hands and elbows.  Patient describes it as itchy and peeling, but not painful or tender.  Patient has not tried any treatments yet.  She continues to take Ativan for anxiety.  Denis any associated fever, chills, nausea or vomiting.  Denies any lesions in her mouth.  Review of Systems  Per history of present illness    Objective:   Physical Exam  Constitutional: No distress.  Musculoskeletal:       Lumbar spine: bony tenderness on palpation; no tissue/textures or skin changes; pain is better with flexion, worse with extension; normal gait  Skin:       Dry, scaly lesions on palmar surfaces on hands, bilaterally; few blisters, but no drainage; LT elbow appears dry and erythematous, but no open lesions      Assessment & Plan:

## 2011-08-24 NOTE — Patient Instructions (Signed)
It was nice to see you today, Stacy Campbell. [facility] If it is too expensive, please call me. For severe pain, you can take Tramadol 50 mg 2 tablets (total 100 mg) twice daily as needed. Schedule follow up with Pain Clinic in 1-2 months. For hands, please apply AVEENO cream or lotion twice a day for dry skin. Also, apply antibiotic cream to your elbows twice a day x 5 days. Follow up with me in 4-6 weeks or sooner as needed.

## 2011-08-24 NOTE — Assessment & Plan Note (Addendum)
Patient has been seen at pain clinic. I appreciate the consult. Patient complains of  low back pain, not relieved by tramadol 50 mg twice a day.  Because patient is seen at pain clinic, I do not to change medications.  Advised patient to increase dose tramadol 100 mg twice daily as needed for pain. Hold for drowsiness.  Also prescribed Voltaren gel if patient can afford it via medical assistance program.  Counseled on lifestyle modification - [facility] Patient to follow up with pain clinic if pain becomes more severe.

## 2011-08-24 NOTE — Assessment & Plan Note (Signed)
Now with lesions on her hands, palmar surfaces, and left elbow. Gave samples of Bactroban ointment to apply on her left elbow. Skin rash is concerning for psoriasis, may consider referral to dermatology if rash worsens. Followup in 4-6 weeks.

## 2011-09-12 ENCOUNTER — Encounter: Payer: Self-pay | Admitting: Family Medicine

## 2011-09-12 ENCOUNTER — Ambulatory Visit (INDEPENDENT_AMBULATORY_CARE_PROVIDER_SITE_OTHER): Payer: Self-pay | Admitting: Family Medicine

## 2011-09-12 VITALS — BP 137/87 | HR 85 | Temp 98.3°F | Ht 65.0 in | Wt 219.0 lb

## 2011-09-12 DIAGNOSIS — L0390 Cellulitis, unspecified: Secondary | ICD-10-CM

## 2011-09-12 DIAGNOSIS — L0291 Cutaneous abscess, unspecified: Secondary | ICD-10-CM

## 2011-09-12 MED ORDER — DOXYCYCLINE HYCLATE 100 MG PO TABS: 100.0000 mg | ORAL_TABLET | Freq: Two times a day (BID) | ORAL | Status: DC

## 2011-09-12 MED ORDER — IBUPROFEN 800 MG PO TABS: 800.0000 mg | ORAL_TABLET | Freq: Three times a day (TID) | ORAL | Status: AC

## 2011-09-12 MED ORDER — HYDROXYZINE HCL 10 MG PO TABS: 10.0000 mg | ORAL_TABLET | Freq: Three times a day (TID) | ORAL | Status: DC | PRN

## 2011-09-12 NOTE — Patient Instructions (Addendum)
Take the antibiotic for 10 days  You may continue to use ibuprofen and hydroxyzine as needed  Follow-up with Dr. Tye Savoy in 1 week.

## 2011-09-12 NOTE — Progress Notes (Signed)
   Subjective:    Patient ID: Stacy Campbell, female    DOB: 01/06/1956, 56 y.o.   MRN: [id number]  HPI # Worsening lesion on left leg and pain  She has chronic venous insufficiency dermatitis, however, over the past week, she has had worsening redness and pain.  Ibuprofen helps the pain.   The lesion is itchy as well.  She takes hydroxyzine, which helps.   Review of Systems Denies fevers/chills/nausea  Allergies, medication, past medical history reviewed.  Significant for: -Depression, anxiety -Obesity -HTN -Hepatitis C--stable. Platelets borderline low in the 150s. PT/INR WNL.   No history of diabetes.     Objective:   Physical Exam GEN: NAD; well-appearing; disheveled; obese SKIN:    Large area of hyperpigmentation and erythema on lower half of shin and spreading to lateral and medal lower leg (but not on calf) with several <0.5 cm areas of excoriated skin; no calf tenderness EXT: 2+ pedal pulses; sensation intact in both feet bilaterally    Assessment & Plan:

## 2011-09-12 NOTE — Assessment & Plan Note (Signed)
She has a history of chronic venous dermatitis from chronic venous stasis.  She now has cellulitis, likely from her scratching the area. Roland Rack boot placed -Doxycycline x 10 days for cellulitis -Follow-up in 1 week to re-evaluate

## 2011-09-19 ENCOUNTER — Encounter
Payer: No Typology Code available for payment source | Attending: Physical Medicine and Rehabilitation | Admitting: Physical Medicine and Rehabilitation

## 2011-09-19 DIAGNOSIS — F329 Major depressive disorder, single episode, unspecified: Secondary | ICD-10-CM | POA: Insufficient documentation

## 2011-09-19 DIAGNOSIS — R252 Cramp and spasm: Secondary | ICD-10-CM | POA: Insufficient documentation

## 2011-09-19 DIAGNOSIS — M545 Low back pain, unspecified: Secondary | ICD-10-CM | POA: Insufficient documentation

## 2011-09-19 DIAGNOSIS — F411 Generalized anxiety disorder: Secondary | ICD-10-CM | POA: Insufficient documentation

## 2011-09-19 DIAGNOSIS — F3289 Other specified depressive episodes: Secondary | ICD-10-CM | POA: Insufficient documentation

## 2011-09-19 DIAGNOSIS — G8929 Other chronic pain: Secondary | ICD-10-CM | POA: Insufficient documentation

## 2011-09-19 DIAGNOSIS — G5600 Carpal tunnel syndrome, unspecified upper limb: Secondary | ICD-10-CM | POA: Insufficient documentation

## 2011-09-19 DIAGNOSIS — I10 Essential (primary) hypertension: Secondary | ICD-10-CM | POA: Insufficient documentation

## 2011-09-21 ENCOUNTER — Ambulatory Visit (INDEPENDENT_AMBULATORY_CARE_PROVIDER_SITE_OTHER): Payer: Self-pay | Admitting: Family Medicine

## 2011-09-21 ENCOUNTER — Encounter: Payer: Self-pay | Admitting: Family Medicine

## 2011-09-21 ENCOUNTER — Ambulatory Visit: Payer: Self-pay | Admitting: Family Medicine

## 2011-09-21 VITALS — BP 135/68 | HR 76 | Temp 98.2°F | Ht 65.0 in | Wt 220.5 lb

## 2011-09-21 DIAGNOSIS — L0390 Cellulitis, unspecified: Secondary | ICD-10-CM

## 2011-09-21 DIAGNOSIS — L0291 Cutaneous abscess, unspecified: Secondary | ICD-10-CM

## 2011-09-21 MED ORDER — HYDROXYZINE HCL 10 MG PO TABS: 10.0000 mg | ORAL_TABLET | Freq: Three times a day (TID) | ORAL | Status: DC | PRN

## 2011-09-21 MED ORDER — NYSTATIN 100000 UNIT/GM EX POWD: Freq: Two times a day (BID) | CUTANEOUS | Status: DC

## 2011-09-21 MED ORDER — TRIAMCINOLONE ACETONIDE 0.1 % EX CREA: TOPICAL_CREAM | Freq: Two times a day (BID) | CUTANEOUS | Status: DC

## 2011-09-21 NOTE — Patient Instructions (Addendum)
Your leg wound appears to be improving. Please do not scratch it! Take Atarax by mouth and apply Triamcinolone cream four times per day as needed for itching. Also, use Nystatin powder twice a day and wrap it with gauze. Follow up with me in 2 weeks to recheck wound.  Dressing Change Dressings are placed over wounds to keep them clean, dry, and protected from further injury. This provides an environment that favors wound healing. Good wound care includes resting and elevating the injured part until the pain and swelling are better. Change your wound dressing as recommended by your caregiver. When removing an old dressing, lift it slowly away from the wound. If the dressing sticks to the wound, dampen it with half-strength peroxide or tap water. Clean the wound gently with a moist cloth, remove any loose material, and apply antibiotic ointment if recommended by your caregiver. Usually it is okay for a wound to get wet. Wash it with mildly soapy water. Watch for signs of infection when changing a dressing. SEEK MEDICAL CARE IF:  You develop increased pain, redness, or swelling.   You have pus-like drainage from the wound.   You develop a fever greater than 100.4 F (38 C).  Document Released: 02/02/2004 Document Revised: 12/14/2010 Document Reviewed: 11/06/2010 [facility] Patient Information 2012 [place], [place].

## 2011-09-23 ENCOUNTER — Encounter: Payer: Self-pay | Admitting: Family Medicine

## 2011-09-23 NOTE — Assessment & Plan Note (Addendum)
Improved after Doxycycline and Unna boot.  Patient afebrile today. Will treat itchiness with Atarax and Triamcinolone ointment. Will also give Rx for Nystatin powder for possible yeast infection on exam. Advised patient to wrap lesion with gauze after applying ointments to help decrease irritation from clothing. Follow up in 2 weeks for wound check. Red flags discussed with patient and per AVS.

## 2011-09-23 NOTE — Progress Notes (Signed)
  Subjective:    Patient ID: Stacy Campbell, female    DOB: 07/09/1955, 56 y.o.   MRN: [id number]  HPI  Patient comes to clinic for follow up LEFT lower leg cellulitis and pain. She has a history of prurigo nodularis and mild chronic venous insufficiency.  She tends to scratch the lesion which causes worsening erythema and superimposed bacterial infections.  She was seen by Dr. Madolyn Frieze who gave a Rx for Doxy x 10 days.  Patient completed entire course.  She liked the [employer] because it kept her from scratching lesion.  For prurigo nodularis, we have been treating with Ativan 0.5 TID PRN.   Denies any leg pain today, only complains of itching.  Atarax helps with itching.   Review of Systems  Denies any fever, chills, nausea/vomiting, or NS.    Objective:   Physical Exam  GEN: NAD; well-appearing; in no acute distress SKIN:  LEFT LE - Unna boot removed today.  Large area of hyperpigmentation and erythema on lower half of shin with possible satellite lesions around the border; no open excoriations or ulcers; no active bleeding or pus drainage EXT: 2+ pedal pulses; sensation intact in both feet bilaterally     Assessment & Plan:

## 2011-10-02 ENCOUNTER — Other Ambulatory Visit: Payer: Self-pay | Admitting: Family Medicine

## 2011-10-02 NOTE — Telephone Encounter (Signed)
Patient is calling because she doesn't have anymore refills on her Xanax and that a new Rx was to be sent to [facility] on [address].

## 2011-10-03 ENCOUNTER — Telehealth: Payer: Self-pay | Admitting: Family Medicine

## 2011-10-03 MED ORDER — ALPRAZOLAM 0.5 MG PO TABS: 0.5000 mg | ORAL_TABLET | Freq: Three times a day (TID) | ORAL | Status: DC | PRN

## 2011-10-03 NOTE — Telephone Encounter (Signed)
Forwarded to pcp.Stacy Campbell [place]  

## 2011-10-03 NOTE — Telephone Encounter (Signed)
Had to reprint Rx

## 2011-10-03 NOTE — Telephone Encounter (Signed)
Will leave Rx at front desk for pick up.

## 2011-10-04 NOTE — Telephone Encounter (Signed)
called pt and got the following message the subscriber cannot get messages at this time. Wanted to inform her that Rx is up front for p/u.Stacy Campbell [place]

## 2011-10-04 NOTE — Telephone Encounter (Signed)
Pt has p/u Rx.Loralee Pacas [place]

## 2011-10-08 ENCOUNTER — Ambulatory Visit (INDEPENDENT_AMBULATORY_CARE_PROVIDER_SITE_OTHER): Payer: Self-pay | Admitting: Family Medicine

## 2011-10-08 ENCOUNTER — Encounter: Payer: Self-pay | Admitting: Family Medicine

## 2011-10-08 VITALS — BP 154/78 | HR 82 | Temp 98.6°F | Wt 224.0 lb

## 2011-10-08 DIAGNOSIS — B1920 Unspecified viral hepatitis C without hepatic coma: Secondary | ICD-10-CM

## 2011-10-08 DIAGNOSIS — L280 Lichen simplex chronicus: Secondary | ICD-10-CM

## 2011-10-08 DIAGNOSIS — L281 Prurigo nodularis: Secondary | ICD-10-CM

## 2011-10-08 DIAGNOSIS — Z23 Encounter for immunization: Secondary | ICD-10-CM

## 2011-10-08 NOTE — Patient Instructions (Addendum)
Please call and schedule appointment with [facility] as soon as possible. I will order blood tests today and call you with results. I will refer you to a wound care specialist.  We will call with time and date of appointment. Return to clinic if rash worsens or you develop pus, drainage, bleeding, fever, nausea or vomiting.

## 2011-10-09 ENCOUNTER — Other Ambulatory Visit: Payer: Self-pay | Admitting: Family Medicine

## 2011-10-09 DIAGNOSIS — Z23 Encounter for immunization: Secondary | ICD-10-CM

## 2011-10-09 LAB — HEPATIC FUNCTION PANEL
Alkaline Phosphatase: 62 U/L (ref 39–117)
Indirect Bilirubin: 0.3 mg/dL (ref 0.0–0.9)
Total Bilirubin: 0.4 mg/dL (ref 0.3–1.2)

## 2011-10-09 LAB — PROTIME-INR: INR: 1.11 (ref <1.50)

## 2011-10-09 MED ORDER — CHOLESTYRAMINE 4 G PO PACK: 1.0000 | PACK | Freq: Two times a day (BID) | ORAL | Status: DC

## 2011-10-09 MED ORDER — HYDROXYZINE HCL 10 MG PO TABS: 10.0000 mg | ORAL_TABLET | ORAL | Status: DC | PRN

## 2011-10-09 MED ORDER — ZOLPIDEM TARTRATE 10 MG PO TABS: 10.0000 mg | ORAL_TABLET | Freq: Every evening | ORAL | Status: DC | PRN

## 2011-10-09 NOTE — Telephone Encounter (Signed)
LVM for patient to call back to inform of below 

## 2011-10-09 NOTE — Telephone Encounter (Signed)
Patient was seen yesterday and was told that an Rx for her Ambien would be sent to her pharmacy.  The patient also wants Hydroxyzine for her itching.  She uses [facility] on [address].

## 2011-10-09 NOTE — Telephone Encounter (Signed)
I am sorry but Ambien cannot be send electronically.  She will need to come pick it up tomorrow.  Atarax has been sent to her pharmacy.

## 2011-10-10 ENCOUNTER — Other Ambulatory Visit: Payer: Self-pay | Admitting: Family Medicine

## 2011-10-10 ENCOUNTER — Telehealth: Payer: Self-pay | Admitting: *Deleted

## 2011-10-10 MED ORDER — ZOLPIDEM TARTRATE 5 MG PO TABS: 5.0000 mg | ORAL_TABLET | Freq: Every evening | ORAL | Status: DC | PRN

## 2011-10-10 MED ORDER — ZOLPIDEM TARTRATE 10 MG PO TABS: 10.0000 mg | ORAL_TABLET | Freq: Every evening | ORAL | Status: DC | PRN

## 2011-10-10 NOTE — Telephone Encounter (Signed)
Patient called back to check on the status of the refill on Ambien that was printed yesterday for the patient to pick up, but the prescription cannot be found and Dr. Tye Savoy has not responded to pages.

## 2011-10-10 NOTE — Telephone Encounter (Signed)
Will refilll this at dose of 5 mg which is the new recommended dose for women.

## 2011-10-10 NOTE — Telephone Encounter (Signed)
Spoke with patient and informed her of rx I was placing up front for pick up

## 2011-10-10 NOTE — Telephone Encounter (Signed)
Dr. Tye Savoy believes that rx was printed to a printer and not picked up. She is post call and would like if one of the white team doctors or a preceptor print this back off.

## 2011-10-12 ENCOUNTER — Inpatient Hospital Stay (HOSPITAL_COMMUNITY)
Admission: EM | Admit: 2011-10-12 | Discharge: 2011-10-16 | DRG: 603 | Disposition: A | Discharge status: Home / Self Care | Payer: Medicaid Other | Attending: Family Medicine | Admitting: Family Medicine

## 2011-10-12 DIAGNOSIS — Z6837 Body mass index (BMI) 37.0-37.9, adult: Secondary | ICD-10-CM

## 2011-10-12 DIAGNOSIS — I10 Essential (primary) hypertension: Secondary | ICD-10-CM

## 2011-10-12 DIAGNOSIS — E876 Hypokalemia: Secondary | ICD-10-CM

## 2011-10-12 DIAGNOSIS — I152 Hypertension secondary to endocrine disorders: Secondary | ICD-10-CM | POA: Diagnosis present

## 2011-10-12 DIAGNOSIS — L03119 Cellulitis of unspecified part of limb: Secondary | ICD-10-CM

## 2011-10-12 DIAGNOSIS — J45909 Unspecified asthma, uncomplicated: Secondary | ICD-10-CM | POA: Diagnosis present

## 2011-10-12 DIAGNOSIS — E669 Obesity, unspecified: Secondary | ICD-10-CM | POA: Diagnosis present

## 2011-10-12 DIAGNOSIS — F329 Major depressive disorder, single episode, unspecified: Secondary | ICD-10-CM

## 2011-10-12 DIAGNOSIS — L03116 Cellulitis of left lower limb: Secondary | ICD-10-CM

## 2011-10-12 DIAGNOSIS — R21 Rash and other nonspecific skin eruption: Secondary | ICD-10-CM

## 2011-10-12 DIAGNOSIS — L02419 Cutaneous abscess of limb, unspecified: Secondary | ICD-10-CM

## 2011-10-12 DIAGNOSIS — B86 Scabies: Secondary | ICD-10-CM | POA: Diagnosis present

## 2011-10-12 DIAGNOSIS — E1159 Type 2 diabetes mellitus with other circulatory complications: Secondary | ICD-10-CM | POA: Diagnosis present

## 2011-10-12 DIAGNOSIS — B182 Chronic viral hepatitis C: Secondary | ICD-10-CM | POA: Diagnosis present

## 2011-10-12 DIAGNOSIS — L0390 Cellulitis, unspecified: Secondary | ICD-10-CM | POA: Diagnosis present

## 2011-10-12 DIAGNOSIS — I872 Venous insufficiency (chronic) (peripheral): Secondary | ICD-10-CM | POA: Diagnosis present

## 2011-10-12 DIAGNOSIS — Z87891 Personal history of nicotine dependence: Secondary | ICD-10-CM

## 2011-10-12 DIAGNOSIS — G4700 Insomnia, unspecified: Secondary | ICD-10-CM | POA: Diagnosis present

## 2011-10-12 DIAGNOSIS — F411 Generalized anxiety disorder: Secondary | ICD-10-CM | POA: Diagnosis present

## 2011-10-12 DIAGNOSIS — L281 Prurigo nodularis: Secondary | ICD-10-CM | POA: Diagnosis present

## 2011-10-12 DIAGNOSIS — Z7982 Long term (current) use of aspirin: Secondary | ICD-10-CM

## 2011-10-12 DIAGNOSIS — L280 Lichen simplex chronicus: Secondary | ICD-10-CM | POA: Diagnosis present

## 2011-10-12 DIAGNOSIS — F339 Major depressive disorder, recurrent, unspecified: Secondary | ICD-10-CM | POA: Diagnosis present

## 2011-10-12 LAB — CBC WITH DIFFERENTIAL/PLATELET
Basophils Absolute: 0 10*3/uL (ref 0.0–0.1)
Eosinophils Relative: 7 % — ABNORMAL HIGH (ref 0–5)
HCT: 36 % (ref 36.0–46.0)
Hemoglobin: 11.8 g/dL — ABNORMAL LOW (ref 12.0–15.0)
Lymphocytes Relative: 38 % (ref 12–46)
Lymphs Abs: 1.7 10*3/uL (ref 0.7–4.0)
MCV: 79.6 fL (ref 78.0–100.0)
Monocytes Absolute: 0.5 10*3/uL (ref 0.1–1.0)
Monocytes Relative: 11 % (ref 3–12)
Neutro Abs: 2 10*3/uL (ref 1.7–7.7)
RDW: 15.1 % (ref 11.5–15.5)
WBC: 4.4 10*3/uL (ref 4.0–10.5)

## 2011-10-12 LAB — BASIC METABOLIC PANEL
BUN: 11 mg/dL (ref 6–23)
CO2: 28 mEq/L (ref 19–32)
Calcium: 8.6 mg/dL (ref 8.4–10.5)
Chloride: 103 mEq/L (ref 96–112)
Creatinine, Ser: 0.7 mg/dL (ref 0.50–1.10)
Glucose, Bld: 90 mg/dL (ref 70–99)

## 2011-10-12 MED ORDER — POTASSIUM CHLORIDE CRYS ER 20 MEQ PO TBCR
40.0000 meq | EXTENDED_RELEASE_TABLET | Freq: Once | ORAL | Status: AC
  Administered 2011-10-12: 40 meq via ORAL
  Filled 2011-10-12: qty 2

## 2011-10-12 MED ORDER — CLINDAMYCIN PHOSPHATE 600 MG/50ML IV SOLN
600.0000 mg | Freq: Once | INTRAVENOUS | Status: AC
  Administered 2011-10-12: 600 mg via INTRAVENOUS
  Filled 2011-10-12: qty 50

## 2011-10-12 MED ORDER — LORAZEPAM 2 MG/ML IJ SOLN
1.0000 mg | Freq: Once | INTRAMUSCULAR | Status: AC
  Administered 2011-10-12: 1 mg via INTRAVENOUS
  Filled 2011-10-12: qty 1

## 2011-10-12 NOTE — Assessment & Plan Note (Addendum)
Rash on LT shin appears to be worsening status post 10 day course of antibiotic + Unna boot.  Now she has lesions on bilateral arms, chest, abdomen, and open excoriations anterior shin.  She is afebrile and denies any nausea/vomiting, NS, chills, or sweats.  No evidence of systemic infection.  Pruritis could be secondary to Hepatitis C.  Seen with Dr. Mahala Menghini who appreciated malar rash. - Will check INR and LFTs today - Will start cholestyramine and increase frequency of Atarax - Continue scheduled Ativan for prurigo nodularis - Referral to wound care for further recommendations - Consider work up for SLE at next visit

## 2011-10-12 NOTE — ED Notes (Signed)
Pt has fine papular rash all over for the past week. Rash is itchy. Pt has started taking Vistaril for the rash since last week, but states it doesn't help much. Pt with no acute distress.  Pt also has LLE leg swelling and redness. Pt states she has a weeping wound on LLE. Pt states the wound started from her eczema and has gotten much worse since she developed the rash. Pt ambulatory at triage. Pt states she has body aches all over.

## 2011-10-12 NOTE — Progress Notes (Signed)
  Subjective:    Patient ID: Stacy Campbell, female    DOB: 1955-04-21, 56 y.o.   MRN: [id number]  HPI  Patient comes to clinic for follow up LT lower leg cellulitis and new rash on chest/abdomen and upper extremities.  She has a history of prurigo nodularis, Hepatitis C, and mild chronic venous insufficiency.  This has been a chronic issue for about 1.5 years - she has a tendency to scratch lesions due to prurigo nodularis and develops superimposed bacterial infections.  She told me 1 week ago that she completed course of Doxy x 10 days.  Today, however, she says she is still taking an "antibiotic".  Patient cannot remember name of medication, and did not bring in Rx bottles today.  Today, patient says that rash on LT shin has worsened.  She complains of itching, "crawling", and burning sensation.  Triamcinolone, Nystatin powder, and Atarax Q 8 hr PRN have provided minimal relief.  She has now developed a rash on chest/abdomen and upper extremities that started 3-4 days ago.  Nobody else in her family has similar rash.  Describes rash as itching and "crawling" sensation.  She denies changing soap/detergent or exposure to poisonous plants or bugs.  She continues to Ativan to decrease anxiety and itching.  Patient denies any associated fever, chills, NS, nausea/vomiting.  Review of Systems  Per HPI    Objective:   Physical Exam Filed Vitals:   10/08/11 1443  BP: 154/78  Pulse: 82  Temp: 98.6 F (37 C)   GEN: obese, well-appearing, in no acute distress HEENT: oropharynx moist and clear without oral lesions or exudate; malar hyperpigmented lesion on face SKIN: Large area of hyperpigmentation + erythema of LEFT anterior shin extending from ankle to knee; several < 1 cm areas of excoriated skin and ulcers; no active bleeding, pus or drainage; mild tenderness on palpation of erythematous area EXT: 2+ pedal pulses; sensation intact in both feet bilaterally       Assessment & Plan:

## 2011-10-12 NOTE — ED Notes (Signed)
PIV attempts x2. IV team notified

## 2011-10-12 NOTE — ED Provider Notes (Signed)
History     CSN: [id number]  Arrival date & time 10/12/11  1731   First MD Initiated Contact with Patient 10/12/11 2033      Chief Complaint  Patient presents with  . Rash    (Consider location/radiation/quality/duration/timing/severity/associated sxs/prior treatment) HPI History provided by pt and prior chart.  Pt presents w/ severely pruritic rash x 6 days.  Onset pruritis the day before the rash appeared.  No known allergies or contacts.  No one else in household w/ same.  Had similar rash in 12/2010 and her PCP suspected that it was self-inflicted by scratching secondary to "nerves".  No oral or scalp lesions.  No associated fever, cough, SOB, N/V/D. Pt also c/o painful left lower leg rash that started at approx the same time as diffuse rash.  Per prior chart, pt was treated for left lower leg cellulitis w/ 10 days of doxy and an unna boot in early September.  She has a h/o prurigo nodularis and mild chronic venous insuffiency, tends to scratch and then induce secondary bacterial infections.  Per PCP note on 09/23/11, the cellulitis was improved and pruritis was controlled w/ atarax and triamcinolone ointment.  Pt reports that the hyperpigmentation on her shin is stable but the surrounding firey red color appeared this past week.  She has not had relief of pruritis w/ the prescribed medications.  Per PCP note 10/08/11, pt is being referred to [facility] to be evaluated for Hepatitis C.      Past Medical History  Diagnosis Date  . Hypertension   . Depression   . Anxiety     No past surgical history on file.  Family History  Problem Relation Age of Onset  . Asthma Mother   . Diabetes Mother   . Asthma Father   . Diabetes Father   . Cancer Father     History  Substance Use Topics  . 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 Alcohol Use: Not on file    OB History    Grav Para Term Preterm Abortions TAB SAB Ect Mult Living                    Review of Systems  All other systems reviewed and are negative.    Allergies  Review of patient's allergies indicates no known allergies.  Home Medications   Current Outpatient Rx  Name Route Sig Dispense Refill  . ALBUTEROL SULFATE HFA 108 (90 BASE) MCG/ACT IN AERS Inhalation Inhale 2 puffs into the lungs every 4 (four) hours as needed. Wheezing    . ALPRAZOLAM 0.5 MG PO TABS Oral Take 1 tablet (0.5 mg total) by mouth 3 (three) times daily as needed for sleep or anxiety. 90 tablet 0  . ASPIRIN 81 MG PO CHEW Oral Chew 81 mg by mouth daily before breakfast.     . CARVEDILOL 25 MG PO TABS Oral Take 1 tablet (25 mg total) by mouth 2 (two) times daily. Take 1 pill in the morning, and 1 in the evening 60 tablet 5  . CYCLOBENZAPRINE HCL 5 MG PO TABS Oral Take 5 mg by mouth 3 (three) times daily as needed. Muscle spasm    . FUROSEMIDE 20 MG PO TABS Oral Take 20 mg by mouth at bedtime.    Marland Kitchen GABAPENTIN 100 MG PO CAPS Oral Take 100 mg by mouth 3 (three) times daily.    Marland Kitchen HYDROXYZINE HCL 10  MG PO TABS Oral Take 1 tablet (10 mg total) by mouth every 4 (four) hours as needed for itching. 180 tablet 0  . LISINOPRIL-HYDROCHLOROTHIAZIDE 20-25 MG PO TABS Oral Take 1 tablet by mouth daily before breakfast.    . MECLIZINE HCL 25 MG PO TABS Oral Take 1 tablet (25 mg total) by mouth 2 (two) times daily. 30 tablet 3  . POTASSIUM CHLORIDE ER 10 MEQ PO TBCR Oral Take 20 mEq by mouth daily as needed.    . SERTRALINE HCL 100 MG PO TABS  take 1 tablet by mouth at bedtime 34 tablet 11  . ZOLPIDEM TARTRATE 5 MG PO TABS Oral Take 1 tablet (5 mg total) by mouth at bedtime as needed for sleep. 30 tablet 0    BP 158/82  Pulse 98  Temp 98.6 F (37 C) (Oral)  Resp 17  SpO2 100%  Physical Exam  Nursing note and vitals reviewed. Constitutional: She is oriented to person, place, and time. She appears well-developed and well-nourished. No distress.  HENT:  Head: Normocephalic and atraumatic.   Eyes:       Normal appearance  Neck: Normal range of motion.  Cardiovascular: Normal rate and regular rhythm.   Pulmonary/Chest: Effort normal and breath sounds normal. No respiratory distress.  Musculoskeletal: Normal range of motion.  Neurological: She is alert and oriented to person, place, and time.  Skin: Skin is warm and dry. No rash noted.       Diffuse, discrete papular, scabbed lesions and excoriations. Erythematous, discrete macular lesions and dry skin on palms.  Lower 2/3 of left lower leg w/ circumferential cellulitis.  Hyperpigmentation at center of shin.  Trace, non-pitting edema bilaterally and left leg weeping clear fluid anteriorly.  Psychiatric: She has a normal mood and affect. Her behavior is normal.    ED Course  Procedures (including critical care time)  Labs Reviewed  CBC WITH DIFFERENTIAL - Abnormal; Notable for the following:    Hemoglobin 11.8 (*)     Platelets 113 (*)     Eosinophils Relative 7 (*)     All other components within normal limits  BASIC METABOLIC PANEL - Abnormal; Notable for the following:    Potassium 3.0 (*)     All other components within normal limits  RAPID STREP SCREEN  LAB REPORT - SCANNED  CBC  CREATININE, SERUM   No results found.   1. Cellulitis of left lower leg   2. Hypokalemia   3. Rash and nonspecific skin eruption       MDM  Pt w/ cellulitis LLE.  Because patient is currently being evaluated for hep C and is therefore, likely immunocompromised, and the severity of the rash, IV clindamycin ordered and pt transferred to [facility] for admission by [facility] [place].  CC today was diffuse, pruritic rash.  Etiology of rash unknown but symptoms controlled w/ 1mg  IV ativan in ED.         Otilio Miu, [place] 10/13/11 [phone]

## 2011-10-12 NOTE — Assessment & Plan Note (Signed)
Patient seen at [facility] in [place], then referred to [facility] for treatment.  Patient has not scheduled this appointment yet.   - Will check liver function with PT-INR and CMET - Encouraged patient to make appointment with [facility] as early as possible - Consider US abdomen if labs are abnormal - Follow up in 2 weeks or sooner as needed

## 2011-10-13 ENCOUNTER — Encounter (HOSPITAL_COMMUNITY): Payer: Self-pay | Admitting: *Deleted

## 2011-10-13 LAB — CBC
HCT: 31.8 % — ABNORMAL LOW (ref 36.0–46.0)
MCH: 25.4 pg — ABNORMAL LOW (ref 26.0–34.0)
MCHC: 32.1 g/dL (ref 30.0–36.0)
MCV: 79.3 fL (ref 78.0–100.0)
Platelets: 87 10*3/uL — ABNORMAL LOW (ref 150–400)
RDW: 14.8 % (ref 11.5–15.5)
WBC: 3.4 10*3/uL — ABNORMAL LOW (ref 4.0–10.5)

## 2011-10-13 LAB — CREATININE, SERUM: GFR calc Af Amer: >90 mL/min (ref >90)

## 2011-10-13 MED ORDER — HYDROCHLOROTHIAZIDE 25 MG PO TABS
25.0000 mg | ORAL_TABLET | Freq: Every day | ORAL | Status: DC
  Administered 2011-10-13 – 2011-10-16 (×4): 25 mg via ORAL
  Filled 2011-10-13 (×4): qty 1

## 2011-10-13 MED ORDER — LISINOPRIL 20 MG PO TABS
20.0000 mg | ORAL_TABLET | Freq: Every day | ORAL | Status: DC
  Administered 2011-10-13 – 2011-10-16 (×4): 20 mg via ORAL
  Filled 2011-10-13 (×4): qty 1

## 2011-10-13 MED ORDER — FUROSEMIDE 20 MG PO TABS
20.0000 mg | ORAL_TABLET | Freq: Every day | ORAL | Status: DC
  Administered 2011-10-13 – 2011-10-15 (×3): 20 mg via ORAL
  Filled 2011-10-13 (×4): qty 1

## 2011-10-13 MED ORDER — ZOLPIDEM TARTRATE 5 MG PO TABS
5.0000 mg | ORAL_TABLET | Freq: Every evening | ORAL | Status: DC | PRN
  Administered 2011-10-13 – 2011-10-15 (×4): 5 mg via ORAL
  Filled 2011-10-13 (×4): qty 1

## 2011-10-13 MED ORDER — HYDROCODONE-ACETAMINOPHEN 10-325 MG PO TABS
1.0000 | ORAL_TABLET | Freq: Four times a day (QID) | ORAL | Status: DC | PRN
  Administered 2011-10-13 – 2011-10-16 (×5): 2 via ORAL
  Filled 2011-10-13 (×5): qty 2

## 2011-10-13 MED ORDER — VANCOMYCIN HCL 1000 MG IV SOLR
2000.0000 mg | Freq: Once | INTRAVENOUS | Status: AC
  Administered 2011-10-13: 2000 mg via INTRAVENOUS
  Filled 2011-10-13: qty 2000

## 2011-10-13 MED ORDER — CARVEDILOL 25 MG PO TABS
25.0000 mg | ORAL_TABLET | Freq: Two times a day (BID) | ORAL | Status: DC
  Administered 2011-10-13 – 2011-10-15 (×6): 25 mg via ORAL
  Filled 2011-10-13 (×9): qty 1

## 2011-10-13 MED ORDER — LORAZEPAM 0.5 MG PO TABS
0.5000 mg | ORAL_TABLET | Freq: Three times a day (TID) | ORAL | Status: DC | PRN
  Administered 2011-10-14: 0.5 mg via ORAL
  Filled 2011-10-13 (×2): qty 1

## 2011-10-13 MED ORDER — ALBUTEROL SULFATE HFA 108 (90 BASE) MCG/ACT IN AERS: 2.0000 | INHALATION_SPRAY | RESPIRATORY_TRACT | Status: DC | PRN

## 2011-10-13 MED ORDER — SERTRALINE HCL 100 MG PO TABS
100.0000 mg | ORAL_TABLET | Freq: Every day | ORAL | Status: DC
  Administered 2011-10-13 – 2011-10-15 (×3): 100 mg via ORAL
  Filled 2011-10-13 (×4): qty 1

## 2011-10-13 MED ORDER — LISINOPRIL-HYDROCHLOROTHIAZIDE 20-25 MG PO TABS: 1.0000 | ORAL_TABLET | Freq: Every day | ORAL | Status: DC

## 2011-10-13 MED ORDER — SODIUM CHLORIDE 0.9 % IV SOLN
INTRAVENOUS | Status: DC
  Administered 2011-10-13: 03:00:00 via INTRAVENOUS

## 2011-10-13 MED ORDER — POTASSIUM CHLORIDE ER 10 MEQ PO TBCR
20.0000 meq | EXTENDED_RELEASE_TABLET | Freq: Every day | ORAL | Status: DC
  Administered 2011-10-13 – 2011-10-16 (×4): 20 meq via ORAL
  Filled 2011-10-13 (×5): qty 2

## 2011-10-13 MED ORDER — ACETAMINOPHEN 325 MG PO TABS
650.0000 mg | ORAL_TABLET | Freq: Four times a day (QID) | ORAL | Status: DC | PRN
  Filled 2011-10-13: qty 2

## 2011-10-13 MED ORDER — ENOXAPARIN SODIUM 40 MG/0.4ML ~~LOC~~ SOLN
40.0000 mg | Freq: Every day | SUBCUTANEOUS | Status: DC
  Administered 2011-10-13 – 2011-10-16 (×4): 40 mg via SUBCUTANEOUS
  Filled 2011-10-13 (×4): qty 0.4

## 2011-10-13 MED ORDER — PERMETHRIN 5 % EX CREA
TOPICAL_CREAM | Freq: Once | CUTANEOUS | Status: AC
  Administered 2011-10-13: via TOPICAL
  Filled 2011-10-13: qty 60

## 2011-10-13 MED ORDER — ACETAMINOPHEN 650 MG RE SUPP: 650.0000 mg | Freq: Four times a day (QID) | RECTAL | Status: DC | PRN

## 2011-10-13 MED ORDER — HYDROXYZINE HCL 10 MG PO TABS
10.0000 mg | ORAL_TABLET | ORAL | Status: DC | PRN
  Administered 2011-10-14: 10 mg via ORAL
  Filled 2011-10-13 (×3): qty 1

## 2011-10-13 MED ORDER — IBUPROFEN 800 MG PO TABS
800.0000 mg | ORAL_TABLET | Freq: Four times a day (QID) | ORAL | Status: DC | PRN
  Administered 2011-10-13: 800 mg via ORAL
  Filled 2011-10-13 (×2): qty 1

## 2011-10-13 MED ORDER — GABAPENTIN 100 MG PO CAPS
100.0000 mg | ORAL_CAPSULE | Freq: Three times a day (TID) | ORAL | Status: DC
  Administered 2011-10-13 – 2011-10-16 (×11): 100 mg via ORAL
  Filled 2011-10-13 (×12): qty 1

## 2011-10-13 MED ORDER — ASPIRIN 81 MG PO CHEW
81.0000 mg | CHEWABLE_TABLET | Freq: Every day | ORAL | Status: DC
  Administered 2011-10-13 – 2011-10-16 (×4): 81 mg via ORAL
  Filled 2011-10-13 (×5): qty 1

## 2011-10-13 MED ORDER — VANCOMYCIN HCL IN DEXTROSE 1-5 GM/200ML-% IV SOLN
1000.0000 mg | Freq: Two times a day (BID) | INTRAVENOUS | Status: DC
  Administered 2011-10-13 – 2011-10-15 (×4): 1000 mg via INTRAVENOUS
  Filled 2011-10-13 (×5): qty 200

## 2011-10-13 NOTE — H&P (Signed)
Family Medicine Teaching [facility] Admission History and Physical Service Pager: [phone]  Patient name: Stacy Campbell Medical record number: [id number] Date of birth: 1955-04-25 Age: 56 y.o. Gender: female  Primary Care Provider: DE LA CRUZ,IVY, DO  Chief Complaint: left lower leg redness, swelling, and pain  Assessment and Plan: Stacy Campbell is a 56 y.o. year old female with PMH of prurigo nodularis, hepatitis C, depression/anxiety, and asthma presenting with cellulitis of the left lower leg, worsening for 1 week after 10 day course of doxycycline for similar but less severe cellulitis about 1 month ago. Currently afebrile without elevated WBC. Treated at [facility] ED with 1 dose of clindamycin.  1. Cellulitis - Likely incompletely healed from previously treated lesion, worsened by pt tendency to scratch due to itching worsened by anxiety. Circumferential to left lower leg, very tender to palpation, with thin serous drainage. Distal pulse intact, but overlying skin appears quite tense. No concern for compartment syndrome currently, but must consider possibility of developing. Plan - Will treat with vancomycin per pharmacy dosing. Atarax for itching. Tylenol and ibuprofen for pain. Will monitor clinically and consider MRI if lesion worsens or if limb appears to be developing compartment syndrome.  2. Depression/anxiety - Continue home Zoloft, 100 mg daily. Ativan 0.5 mg q8 PRN.  3. HTN - Continue home lisinopril-HCTZ, Coreg, Lasix 20 mg daily.  4. Hepatitis C - Per pt history. Liver function grossly unremarkable on 9/30. Pt currently in the process of being referred to [facility] for further work-up. Will consider possible liver pathology contribution to itching.  5. Insomnia - Continue home Ambien 5 mg PRN qHS.  6. Other lesions/itching: not completely c/w scabies, but could consider trial of permetherin if continues to be an issue; PCP started pt on cholestyramine,  although pt has not started taking. Consider checking bile acids to see if this is contributing to pt's generalized itching.  FEN/GI: Regular diet,  Prophylaxis: Lovenox Disposition: Admit  Code Status: Full  History of Present Illness: Stacy Campbell is a 56 y.o. year old female with history of prurigo nodularis, hepatitis C, depression/anxiety and asthma presenting with about 1 week of redness, swelling, itching and "oozing" of rash on her left lower leg. Pt was treated about one month ago for cellulitis with 10 days of doxycycline and states "it never completely cleared up." Pt states the itching and pain started getting worse over the past 2 week, which caused her to come into the ED tonight. She describes the pain as being "like a toothache." Pt states she has taken medications for itching in the past but little has helped. She states, "I try not to scratch, but it's really hard." She takes Xanax and Atarax to help with itching, at home, as anxiety makes her itching worse and she tends to scratch herself when she gets stressed.  She was placed in an Unna boot by her PCP early 09/2011 when cellulitis initially started to help decrease her ability to scratch, which the patient liked.  Otherwise Denies fever, chills, N/V, change in bowel/bladder habits. No sick contacts.  Patient Active Problem List  Diagnosis  . OBESITY, NOS  . DEPRESSION, MAJOR, RECURRENT  . ANXIETY DISORDER  . BENIGN POSITIONAL VERTIGO  . HYPERTENSION, BENIGN SYSTEMIC  . INCONTINENCE, STRESS, FEMALE  . OSTEOARTHRITIS, MULTI SITES  . SPONDYLOSIS, LUMBOSACRAL  . BACK PAIN, LOW  . LEG CRAMPS, NOCTURNAL  . INSOMNIA NOS  . CARDIAC MURMUR  . ASTHMA, INTERMITTENT  . Reflux  .  Eczema  . Vitamin D deficiency  . Sciatica  . Peripheral edema  . Generalized maculopapular rash  . Prurigo nodularis  . Elevated liver enzymes  . Hepatitis C  . Left leg pain  . Cellulitis   Past Medical History: Past Medical History    Diagnosis Date  . Hypertension   . Depression   . Anxiety    Past Surgical History: History reviewed. No pertinent past surgical history. Social History: History  Substance Use Topics  . 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 Alcohol Use: Not on file   For any additional social history documentation, please refer to relevant sections of EMR.  Family History: Family History  Problem Relation Age of Onset  . Asthma Mother   . Diabetes Mother   . Asthma Father   . Diabetes Father   . Cancer Father    Allergies: No Known Allergies No current facility-administered medications on file prior to encounter.   Current Outpatient Prescriptions on File Prior to Encounter  Medication Sig Dispense Refill  . albuterol (VENTOLIN HFA) 108 (90 BASE) MCG/ACT inhaler Inhale 2 puffs into the lungs every 4 (four) hours as needed. Wheezing      . ALPRAZolam (XANAX) 0.5 MG tablet Take 1 tablet (0.5 mg total) by mouth 3 (three) times daily as needed for sleep or anxiety.  90 tablet  0  . aspirin (ASPIRIN CHILDRENS) 81 MG chewable tablet Chew 81 mg by mouth daily before breakfast.       . carvedilol (COREG) 25 MG tablet Take 1 tablet (25 mg total) by mouth 2 (two) times daily. Take 1 pill in the morning, and 1 in the evening  60 tablet  5  . cyclobenzaprine (FLEXERIL) 5 MG tablet Take 5 mg by mouth 3 (three) times daily as needed. Muscle spasm      . furosemide (LASIX) 20 MG tablet Take 20 mg by mouth at bedtime.      . gabapentin (NEURONTIN) 100 MG capsule Take 100 mg by mouth 3 (three) times daily.      . hydrOXYzine (ATARAX/VISTARIL) 10 MG tablet Take 1 tablet (10 mg total) by mouth every 4 (four) hours as needed for itching.  180 tablet  0  . lisinopril-hydrochlorothiazide (PRINZIDE,ZESTORETIC) 20-25 MG per tablet Take 1 tablet by mouth daily before breakfast.      . meclizine (ANTIVERT) 25 MG tablet Take 1 tablet (25 mg total) by mouth 2  (two) times daily.  30 tablet  3  . potassium chloride (K-DUR) 10 MEQ tablet Take 20 mEq by mouth daily as needed.      . sertraline (ZOLOFT) 100 MG tablet take 1 tablet by mouth at bedtime  34 tablet  11  . zolpidem (AMBIEN) 5 MG tablet Take 1 tablet (5 mg total) by mouth at bedtime as needed for sleep.  30 tablet  0   Review Of Systems: Per HPI.  Physical Exam: BP 150/65  Pulse 87  Temp 97.6 F (36.4 C) (Oral)  Resp 18  SpO2 100% Exam: General: adult female lying in bed, in NAD, occasionally scratching at arms/chest during interview HEENT: PERRLA, EOMI, MMM Cardiovascular: RRR, no murmur appreciated Respiratory: CTAB, no wheezes Abdomen: soft, NT, ND, BS+ Extremities: dark red circumferential discoloration of left lower leg from ankle to mid-shin, very tender to palpation with some thin serous drainage; distal pulse 2+, toes warm, but lower leg appears more tensely swollen than clearer  skin above red/warm area Skin: as above for leg lesion; also numerous discrete papular scabbed lesions and excoriated areas to bilateral arms and chest Neuro: no gross focal deficits  Labs and Imaging: CBC BMET   Lab 10/12/11 2113  WBC 4.4  HGB 11.8*  HCT 36.0  PLT 113*    Lab 10/12/11 2113  NA 139  K 3.0*  CL 103  CO2 28  BUN 11  CREATININE 0.70  GLUCOSE 90  CALCIUM 8.6     Bobbye Morton, MD PGY-1, [facility] Family Medicine FPTS Intern pager: [phone]   UPPER LEVEL ADDENDUM  I have seen and examined Sherryl Manges with Dr. Casper Harrison and I agree with the above assessment/plan. I have reviewed all available data and have made any necessary changes to the above H&P.  Elton Catalano 10/13/2011, 4:32AM

## 2011-10-13 NOTE — Progress Notes (Signed)
ANTIBIOTIC CONSULT NOTE - INITIAL  Pharmacy Consult for vancomycin Indication: R/o cellulitis  No Known Allergies  Patient Measurements: Height: 5\' 5"  (165.1 cm) Weight: 224 lb (101.606 kg) (Per 10/08/11 documentation) IBW/kg (Calculated) : 57    Vital Signs: Temp: 97.6 F (36.4 C) (10/05 0228) Temp src: Oral (10/04 1741) BP: 150/65 mmHg (10/05 0228) Pulse Rate: 87  (10/05 0228) Intake/Output from previous day:   Intake/Output from this shift:    Labs:  [facility] 10/12/11 2113  WBC 4.4  HGB 11.8*  PLT 113*  LABCREA --  CREATININE 0.70   Estimated Creatinine Clearance: 92.7 ml/min (by C-G formula based on Cr of 0.7). No results found for this basename: VANCOTROUGH:2,VANCOPEAK:2,VANCORANDOM:2,GENTTROUGH:2,GENTPEAK:2,GENTRANDOM:2,TOBRATROUGH:2,TOBRAPEAK:2,TOBRARND:2,AMIKACINPEAK:2,AMIKACINTROU:2,AMIKACIN:2, in the last 72 hours   Microbiology: Recent Results (from the past 720 hour(s))  RAPID STREP SCREEN     Status: Normal   Collection Time   10/12/11  5:52 PM      Component Value Range Status Comment   Streptococcus, Group A Screen (Direct) NEGATIVE  NEGATIVE Final     Medical History: Past Medical History  Diagnosis Date  . Hypertension   . Depression   . Anxiety     Medications:  Scheduled:    . aspirin  81 mg Oral QAC breakfast  . carvedilol  25 mg Oral BID  . clindamycin (CLEOCIN) IV  600 mg Intravenous Once  . enoxaparin (LOVENOX) injection  40 mg Subcutaneous Daily  . furosemide  20 mg Oral q1800  . gabapentin  100 mg Oral TID  . lisinopril  20 mg Oral Daily   And  . hydrochlorothiazide  25 mg Oral Daily  . LORazepam  1 mg Intravenous Once  . potassium chloride  40 mEq Oral Once  . sertraline  100 mg Oral QHS  . DISCONTD: lisinopril-hydrochlorothiazide  1 tablet Oral QAC breakfast   Assessment: 56 yo female admitted with r/o cellulitis. Pharmacy consulted to manage vancomycin.   Goal of Therapy:  Vancomycin trough level 10-15 mcg/ml  Plan:   1. Vancomycin 2gm IV x 1, then 1gm IV Q12H.  Emeline Gins 10/13/2011,3:26 AM

## 2011-10-13 NOTE — H&P (Signed)
FMTS Attending Admission Note: Stacy Don MD Personal pager:  [phone] FPTS Service Pager:  [phone]  I  have seen and examined this patient, reviewed their chart. I have discussed this patient with the resident. I agree with the resident's findings, assessment and care plan.  56 yo F presenting with worsening LLE cellulitis of 1 week.  Had outpatient treatment for the same 1 month prior with some improvement.  Has also been treated with resolution of symptoms via Unna boot in past, Unna boot provided to protect her leg from excoriations and scratching due to chronic pruritis.  Due worsening swelling and pain, patient presented to [facility] ED yesterday and was transferred to [facility] for admission.  Patient also diagnosed with chronic prurigo nodularis.  However she has had recent new rash that started just a few days ago.  "Different" than usual rash, describes intense itching and rapid spread across arms and chest/back.  Atarax only partially helpful for relief of itching.   PE: Gen:  Alert, cooperative patient who appears stated age in no acute distress.  Vital signs reviewed. Cardiac:  Regular rate and rhythm without murmur auscultated.  Good S1/S2. Pulm:  Clear to auscultation bilaterally with good air movement.  No wheezes or rales noted.   Ext:  RLE with +1 edema ankles.  LLE with circuferential erythema and what appears to be chronic venous stasis changes as well.  +3 edema to mid-shin.  No palpable cords.  Homan's negative.  Erythema extends to dorsum of foot.  +2 pulses DP noted.  TTP and warmth noted.  Some serous weeping noted, no open wounds.   Skin:  Multiple papules, excoriations, some burrows noted BL UE's and across chest.  Also with scaly plaques BL elbows.  Imp/Plan: 1.  LLE cellulitis:   - Seems to also have component of chronic venous stasis.  Patient admits to waxing and waning edema in this leg for years, she is on her feet most of day.   - Treat current bout of cellulitis with  Vancomycin for gram + coverage. - I believe Unna boot helped in past not just to prevent scratching but also because it was treating her chronic venous stasis.  She may need this or some compression again on DC. - Continue leg elevation.  - Consider wound consult if minimal improvement with antibiotics for recommendations regarding compression in light of current cellulitis.    2. Scabies - Believe she has component of prurigo nodularis as well as current scabies infestation - Permethrin to treat tonight.   - Continue Atarax for itching - Recommend Triamcinolone ointment, perhaps under occlusion, on outpt basis for prurigo nodularis.   3.  Hepatitis C - no signs of bile cholestasis, likely not contributing to current pruritis.

## 2011-10-13 NOTE — ED Provider Notes (Signed)
Medical screening examination/treatment/procedure(s) were conducted as a shared visit with non-physician practitioner(s) and myself.  I personally evaluated the patient during the encounter  Stacy Campbell is a 56 y.o. female hx of hep C here with worsening rash on L leg. It has been progressive for the last few weeks. Now it is warm. Patient is a poor historian. Leg is slightly swollen and diminished pulses. She is likely to have venous insufficiency with super imposed cellulitis. She is given IV clinda and admitted for IV abx.    Richardean Canal, MD 10/13/11 [phone]

## 2011-10-14 LAB — CBC WITH DIFFERENTIAL/PLATELET
Basophils Absolute: 0 10*3/uL (ref 0.0–0.1)
Basophils Relative: 0 % (ref 0–1)
Eosinophils Relative: 7 % — ABNORMAL HIGH (ref 0–5)
HCT: 32.8 % — ABNORMAL LOW (ref 36.0–46.0)
MCHC: 31.4 g/dL (ref 30.0–36.0)
MCV: 80 fL (ref 78.0–100.0)
Monocytes Absolute: 0.3 10*3/uL (ref 0.1–1.0)
RDW: 15.2 % (ref 11.5–15.5)

## 2011-10-14 MED ORDER — IBUPROFEN 600 MG PO TABS
600.0000 mg | ORAL_TABLET | Freq: Three times a day (TID) | ORAL | Status: DC
  Administered 2011-10-14 – 2011-10-15 (×4): 600 mg via ORAL
  Filled 2011-10-14 (×8): qty 1

## 2011-10-14 NOTE — Progress Notes (Signed)
FMTS Attending Daily Note:  Jeff Zacarias Krauter MD  [phone] pager  Family Practice pager:  [phone] I have seen and examined this patient and have reviewed their chart. I have discussed this patient with the resident. I agree with the resident's findings, assessment and care plan.     

## 2011-10-14 NOTE — Progress Notes (Signed)
Family Medicine Teaching Service Daily Progress Note Intern Pager: [phone]  Patient name: Stacy Campbell Medical record number: [id number] Date of birth: January 28, 1955 Age: 56 y.o. Gender: female  Primary Care Provider: DE LA CRUZ,IVY, DO  Subjective: Pt seen at bedside. Still having pain in her leg, but improved. Rash and itching on arms/chest feels better after Permethrin cream last night, as well. Pt states swelling and redness is slightly better as well.  Of note, pt does state, "I just feel depressed this morning. I have good days and bad days. My daughter passed in 2009 and I get to thinking about her, sometimes." Pt does state "I'm okay, I just get a little sad sometimes."  Objective: Temp:  [98 F (36.7 C)-99.1 F (37.3 C)] 98 F (36.7 C) (10/06 0600) Pulse Rate:  [75-87] 80  (10/06 0600) Resp:  [18-20] 18  (10/06 0600) BP: (117-157)/(51-81) 117/64 mmHg (10/06 0600) SpO2:  [98 %-100 %] 99 % (10/06 0600) Exam: General: adult female,lying in bed, in NAD, slightly tearful and describes "depression" as above in subj. Cardiovascular: RRR, no rub/murmur Respiratory: CTAB, no wheezes Abdomen: soft, nontender, BS+ Extremities: red circumferential discoloration of left lower leg, tender to palpation with some weeping, as before; appears improved from presentation but still significantly red, warm, and tender; distal pulse 2+, toes warm, cap refill <2 s  Laboratory:  Lab 10/14/11 0753 10/13/11 0715 10/12/11 2113  WBC 2.9* 3.4* 4.4  HGB 10.3* 10.2* 11.8*  HCT 32.8* 31.8* 36.0  PLT 88* 87* 113*    Lab 10/13/11 0715 10/12/11 2113 10/08/11 1610  NA -- 139 --  K -- 3.0* --  CL -- 103 --  CO2 -- 28 --  BUN -- 11 --  CREATININE 0.66 0.70 --  CALCIUM -- 8.6 --  PROT -- -- 7.2  BILITOT -- -- 0.4  ALKPHOS -- -- 62  ALT -- -- 27  AST -- -- 35  GLUCOSE -- 90 --   Imaging/Diagnostic Tests: n/a  Assessment and Plan:  Evan TIFFAY PINETTE is a 56 y.o. year old female with  PMH of prurigo nodularis, hepatitis C, depression/anxiety, and asthma presenting with cellulitis of the left lower leg, worsening for 1 week after 10 day course of doxycycline for similar but less severe cellulitis about 1 month ago. Treated at [facility] ED with 1 dose of clindamycin. Remains afebrile, pain improved. WBC decreased, now actually low.   1. Cellulitis - Likely incompletely healed from previously treated lesion, worsened by pt tendency to scratch due to itching worsened by anxiety. Circumferential to left lower leg, very tender to palpation, with thin serous drainage. Distal pulse intact, but overlying skin appears quite tense. No concern for compartment syndrome currently, but must consider possibility of developing.  Plan - Continue vancomycin per pharmacy dosing; will ultimately transition to PO but considering pt recently did not completely clear infection with Doxycycline, may need to consider alternative abx, perhaps Clindamycin. Atarax for itching. Pain control with scheduled ibuprofen, Norco PRN. Continue to monitor clinically. Will consider wound care consult and/or re-applying Unna boot at discharge to help prevent scratching.  2. Depression/anxiety - Continue home Zoloft, 100 mg daily. Ativan 0.5 mg q8 PRN. Some increased symptoms of depression 10/6. Pt states "I'm fine, I'm just a little sad today." Denies any further/new needs and declines request for chaplain visit. Monitor clinically.  3. HTN - Continue home lisinopril-HCTZ, Coreg, Lasix 20 mg daily.   4. Hepatitis C - Per pt history. Liver function grossly unremarkable  on 9/30. Pt currently in the process of being referred to [facility] for further work-up. Will consider possible liver pathology contribution to itching.   5. Insomnia - Continue home Ambien 5 mg PRN qHS.   6. Other lesions/itching: Improved after one treatment with Permethrin cream. Pt will need instructions on eradication protocol at discharge. PCP started pt on  cholestyramine, although pt has not started taking, yet; no great evidence for liver-related pathology to itching.  FEN/GI: Regular diet,  Prophylaxis: Lovenox  Disposition: Likely D/C home pending improvement of cellulitis Code Status: Full  Gautam Langhorst, Cristal Deer, MD 10/14/2011, 10:13 AM

## 2011-10-14 NOTE — Consult Note (Signed)
WOC consult Note Reason for Consult:LLE Cellulitis, recommendations for care Wound type:infectious Pressure Ulcer POA: No Measurement:circumferential erythema with pretibial denudation, now resurfacing, measuring 10cm x 6cm. Wound ZOX:[id number], but fragile. Drainage (amount, consistency, odor) small amoutns of thin serous exudate being managed by a resusable underpad. Periwound:hemosiderin staining.  No edema today, albeit patient states that she frequently has LE edema on the LLE Dressing procedure/placement/frequency:I will implement a NS dampend gauze dressing to the LLE with an ABD pad topper and secure with Kerlix wrap.  Post discharge, patient may require either once weekly Unna's Boot application as an outpatient, or compression hosiery to maintain and manage her venous insufficiency.  If wraps are considered, they will need to be applied from metatarsal head to patellar notch; patient states that they have been applied in the past only to the pretibial region. Patient understand role of periodic, sustained (at least 30 minutes) elevation in venous return. I will not follow.  Please re-consult if needed. Thanks, Ladona Mow, MSN, RN, [facility], CWOCN [phone])

## 2011-10-15 ENCOUNTER — Inpatient Hospital Stay (HOSPITAL_COMMUNITY): Payer: Medicaid Other

## 2011-10-15 MED ORDER — CLINDAMYCIN HCL 150 MG PO CAPS
450.0000 mg | ORAL_CAPSULE | Freq: Three times a day (TID) | ORAL | Status: DC
  Administered 2011-10-15 – 2011-10-16 (×4): 450 mg via ORAL
  Filled 2011-10-15 (×6): qty 3

## 2011-10-15 NOTE — Progress Notes (Signed)
Family Medicine Teaching Service Daily Progress Note Intern Pager: [phone]  Patient name: Stacy Campbell Medical record number: [id number] Date of birth: Sep 03, 1955 Age: 56 y.o. Gender: female  Primary Care Provider: DE LA CRUZ,IVY, DO  Subjective: Pt seen at bedside. States her leg feels a little better today. Itching somewhat better as well. Still feels depressed, but states "I'm not crying this morning, so that's good, I guess." Daughter present in room this morning, brought up to date with no questions.  Objective: Temp:  [98.4 F (36.9 C)-98.6 F (37 C)] 98.4 F (36.9 C) (10/07 0530) Pulse Rate:  [62-70] 70  (10/07 1408) Resp:  [16-19] 16  (10/07 1408) BP: (108-141)/(54-70) 108/59 mmHg (10/07 1408) SpO2:  [95 %-100 %] 100 % (10/07 1408) Exam: General: adult female,lying in bed, in NAD Cardiovascular: RRR, no rub/murmur Respiratory: CTAB, no wheezes Abdomen: soft, nontender, BS+ Extremities: red circumferential discoloration of left lower leg, tender to palpation with some weeping, as before; continues to improve slowly, still with distal pulses 2+ and cap refill <2  Laboratory:  Lab 10/14/11 0753 10/13/11 0715 10/12/11 2113  WBC 2.9* 3.4* 4.4  HGB 10.3* 10.2* 11.8*  HCT 32.8* 31.8* 36.0  PLT 88* 87* 113*    Lab 10/13/11 0715 10/12/11 2113 10/08/11 1610  NA -- 139 --  K -- 3.0* --  CL -- 103 --  CO2 -- 28 --  BUN -- 11 --  CREATININE 0.66 0.70 --  CALCIUM -- 8.6 --  PROT -- -- 7.2  BILITOT -- -- 0.4  ALKPHOS -- -- 62  ALT -- -- 27  AST -- -- 35  GLUCOSE -- 90 --   Imaging/Diagnostic Tests: n/a  Assessment and Plan:  Stacy Campbell is a 56 y.o. year old female with PMH of prurigo nodularis, hepatitis C, depression/anxiety, and asthma presenting with cellulitis of the left lower leg, worsening for 1 week after 10 day course of doxycycline for similar but less severe cellulitis about 1 month ago. Treated at [facility] ED with 1 dose of clindamycin. Remains  afebrile, pain and cellulitis slowly improving.   1. Cellulitis - Likely incompletely healed from previously treated lesion, worsened by pt tendency to scratch due to itching worsened by anxiety. Circumferential to left lower leg, very tender to palpation, with thin serous drainage. Distal pulse intact, but overlying skin appears quite tense. No concern for compartment syndrome. Plan - Transitioned to PO clindamycin. Wound care consult appreciated; gauze dressing, will likely apply Unna boot at discharge and then once weekly as outpt. Atarax for itching. Pain control with scheduled ibuprofen, Norco PRN. Continue to monitor clinically.  2. Depression/anxiety - Continue home Zoloft, 100 mg daily. Ativan 0.5 mg q8 PRN. Some increased symptoms of depression 10/6, but otherwise stable.  3. HTN - Continue home lisinopril-HCTZ, Coreg, Lasix 20 mg daily.   4. Hepatitis C - Per pt history. Liver function grossly unremarkable on 9/30. Pt currently in the process of being referred to [facility] for further work-up. Will consider possible liver pathology contribution to itching.   5. Insomnia - Continue home Ambien 5 mg PRN qHS.   6. Other lesions/itching: Improved after one treatment with Permethrin cream. Pt will need instructions on eradication protocol at discharge. PCP started pt on cholestyramine, although pt has not started taking, yet; no great evidence for liver-related pathology to itching.  FEN/GI: Regular diet Prophylaxis: Lovenox  Disposition: Likely D/C home pending improvement of cellulitis, probably tomorrow Code Status: Full  Jomari Bartnik, Cristal Deer, MD  10/15/2011, 3:33 PM

## 2011-10-15 NOTE — Progress Notes (Signed)
I discussed with  Dr Street.  I agree with their plans documented in their progress note for today.  

## 2011-10-16 LAB — BASIC METABOLIC PANEL
BUN: 16 mg/dL (ref 6–23)
Calcium: 9.4 mg/dL (ref 8.4–10.5)
Chloride: 103 mEq/L (ref 96–112)
Creatinine, Ser: 0.92 mg/dL (ref 0.50–1.10)
GFR calc Af Amer: 71 mL/min — ABNORMAL LOW (ref >90)
GFR calc Af Amer: 79 mL/min — ABNORMAL LOW (ref >90)
GFR calc non Af Amer: 61 mL/min — ABNORMAL LOW (ref >90)
GFR calc non Af Amer: 68 mL/min — ABNORMAL LOW (ref >90)
Potassium: 3.8 mEq/L (ref 3.5–5.1)
Sodium: 141 mEq/L (ref 135–145)

## 2011-10-16 LAB — CBC
HCT: 32.3 % — ABNORMAL LOW (ref 36.0–46.0)
MCHC: 32.5 g/dL (ref 30.0–36.0)
RDW: 15.5 % (ref 11.5–15.5)

## 2011-10-16 MED ORDER — HYDROCODONE-ACETAMINOPHEN 10-325 MG PO TABS: 1.0000 | ORAL_TABLET | Freq: Four times a day (QID) | ORAL | Status: DC | PRN

## 2011-10-16 MED ORDER — SODIUM CHLORIDE 0.9 % IV BOLUS (SEPSIS)
500.0000 mL | Freq: Once | INTRAVENOUS | Status: AC
  Administered 2011-10-16: 500 mL via INTRAVENOUS

## 2011-10-16 MED ORDER — ACETAMINOPHEN 325 MG PO TABS: 650.0000 mg | ORAL_TABLET | Freq: Four times a day (QID) | ORAL | Status: DC | PRN

## 2011-10-16 MED ORDER — CLINDAMYCIN HCL 150 MG PO CAPS: 450.0000 mg | ORAL_CAPSULE | Freq: Three times a day (TID) | ORAL | Status: DC

## 2011-10-16 MED ORDER — ACETAMINOPHEN 650 MG RE SUPP: 650.0000 mg | Freq: Four times a day (QID) | RECTAL | Status: DC | PRN

## 2011-10-16 MED ORDER — FUROSEMIDE 20 MG PO TABS
20.0000 mg | ORAL_TABLET | Freq: Every day | ORAL | Status: DC
  Administered 2011-10-16: 20 mg via ORAL
  Filled 2011-10-16: qty 1

## 2011-10-16 NOTE — Discharge Summary (Signed)
Family Medicine Teaching [facility] Discharge Summary  Patient name: Stacy Campbell Medical record number: [id number] Date of birth: 02/01/1955 Age: 56 y.o. Gender: female Date of Admission: 10/12/2011  Date of Discharge: 10/16/2011 Admitting Physician: Tobey Grim, MD  Primary Care Provider: DE LA CRUZ,IVY, DO  Indication for Hospitalization: increased redness, swelling, pain/itching of LLE for 1 week Discharge Diagnoses:  Cellulitis, Leg  Prurigo nodularis Intermittent asthma Hypertension, benign systemic Depression, major, recurrent Anxiety disorder Insomnia NOS  Consultations: wound care, orthopedics tech (for Unna boot application)  Significant Labs and Imaging:   Lab 10/16/11 0640 10/14/11 0753 10/13/11 0715  WBC 3.1* 2.9* 3.4*  HGB 10.5* 10.3* 10.2*  HCT 32.3* 32.8* 31.8*  PLT 96* 88* 87*    Lab 10/16/11 1258 10/16/11 0640 10/13/11 0715 10/12/11 2113  NA 141 141 -- 139  K 4.1 3.8 -- 3.0*  CL 103 104 -- 103  CO2 30 29 -- 28  BUN 15 16 -- 11  CREATININE 0.92 1.01 0.66 --  CALCIUM 9.6 9.4 -- 8.6  PROT -- -- -- --  BILITOT -- -- -- --  ALKPHOS -- -- -- --  ALT -- -- -- --  AST -- -- -- --  GLUCOSE 123* 141* -- 90   Abd ultrasound, 10/7 @1853  IMPRESSION:  1. Chronic cholelithiasis. No evidence of acute cholecystitis.  Stent at the 26 mm each.  2. Heterogeneous liver. No focal liver mass identified.  3. Splenic size upper limits of normal.  Procedures: none  Brief Hospital Course:  Stacy Campbell is a 56 y.o. year old female with PMH of prurigo nodularis, hepatitis C, depression/anxiety, and asthma presenting with cellulitis of the left lower leg, worsening for 1 week after 10 day course of doxycycline for similar but less severe cellulitis about 1.5 months ago. Treated at [facility] ED with 1 dose of clindamycin. At time of discharge, pt had remained afebrile with improved itching, pain, and appearance of cellulitis. Remainder of hospital course  detailed by problem, below.   1. Cellulitis - Likely incompletely healed from previously treated lesion, worsened by pt tendency to scratch due to itching worsened by anxiety. Circumferential to left lower leg, very tender to palpation, with thin serous drainage. Distal pulse intact, but overlying skin appeared quite tense, initially with some concern for compartment syndrome. Pt was treated with vancomycin and Transitioned to PO clindamycin. Wound care was consulted and recommended gauze dressing which was applied for the last three days of admission, and an Unna boot was applied at time of discharge. Atarax was given for itching. Pain was controled with scheduled ibuprofen and Norco PRN.  2. Depression/anxiety - Continued on home Zoloft, 100 mg daily. Ativan 0.5 mg q8 PRN. Some increased symptoms of depression 10/6, but otherwise stable.   3. HTN - Continued home lisinopril-HCTZ, Coreg, Lasix 20 mg daily. Of note, pt had an elevation of creatinine during this admission (was treated this admission for cellulitis with Vancomycin as initial abx therapy and ibuprofen for pain).  4. Hepatitis C - Per pt history. Liver function grossly unremarkable on 9/30. Pt currently in the process of being referred to [facility] for further work-up. Abdominal ultrasound results as above, with no evidence that liver disease is contributing to current complaints of chronic itching.  5. Insomnia - Continued on home Ambien 5 mg PRN qHS during admission, without complaint.   6. Scabies and other lesions/itching - Improved after one treatment with Permethrin cream. Pt will need instructions on second  treatment in one week and eradication protocol after discharge. PCP started recently pt on cholestyramine, although pt has not started taking, yet.  Discharge Medications:    Medication List     As of 10/16/2011  8:31 PM    TAKE these medications         ALPRAZolam 0.5 MG tablet   Commonly known as: XANAX   Take 1  tablet (0.5 mg total) by mouth 3 (three) times daily as needed for sleep or anxiety.      ASPIRIN CHILDRENS 81 MG chewable tablet   Generic drug: aspirin   Chew 81 mg by mouth daily before breakfast.      carvedilol 25 MG tablet   Commonly known as: COREG   Take 1 tablet (25 mg total) by mouth 2 (two) times daily. Take 1 pill in the morning, and 1 in the evening      clindamycin 150 MG capsule   Commonly known as: CLEOCIN   Take 3 capsules (450 mg total) by mouth every 8 (eight) hours.      cyclobenzaprine 5 MG tablet   Commonly known as: FLEXERIL   Take 5 mg by mouth 3 (three) times daily as needed. Muscle spasm      furosemide 20 MG tablet   Commonly known as: LASIX   Take 20 mg by mouth at bedtime.      gabapentin 100 MG capsule   Commonly known as: NEURONTIN   Take 100 mg by mouth 3 (three) times daily.      HYDROcodone-acetaminophen 10-325 MG per tablet   Commonly known as: NORCO   Take 1-2 tablets by mouth every 6 (six) hours as needed.      hydrOXYzine 10 MG tablet   Commonly known as: ATARAX/VISTARIL   Take 1 tablet (10 mg total) by mouth every 4 (four) hours as needed for itching.      lisinopril-hydrochlorothiazide 20-25 MG per tablet   Commonly known as: PRINZIDE,ZESTORETIC   Take 1 tablet by mouth daily before breakfast.      meclizine 25 MG tablet   Commonly known as: ANTIVERT   Take 1 tablet (25 mg total) by mouth 2 (two) times daily.      potassium chloride 10 MEQ tablet   Commonly known as: K-DUR   Take 20 mEq by mouth daily as needed.      sertraline 100 MG tablet   Commonly known as: ZOLOFT   take 1 tablet by mouth at bedtime      VENTOLIN HFA 108 (90 BASE) MCG/ACT inhaler   Generic drug: albuterol   Inhale 2 puffs into the lungs every 4 (four) hours as needed. Wheezing      zolpidem 5 MG tablet   Commonly known as: AMBIEN   Take 1 tablet (5 mg total) by mouth at bedtime as needed for sleep.       Issues for Follow Up:  1. Cellulitis -  Please address resolution of infection and adherence to abx regimen (Clindamycin 600 mg TID for 11 more days after discharge). Pt likely caused or exacerbated this infection with chronic nervous habit of itching and had a similar illness about 1.5 months ago. Pt has significant chronic venous stasis of the left lower leg in particular (same area of infection). Unna boot applied at time of discharge with recommendation to change once weekly as an outpt for the next several weeks at least. Also consider wound clinic referral (?has already been placed in the past).  2. Pt had an increase in creatinine from 0.66 to 1.01 that improved to 0.93 with a small bolus of NS, on day of discharge. Of note, pt was treated with ibuprofen scheduled for a few days, as well as several days of Vancomycin, and is on an ACE. Pt also had a decrease in WBC, Hb, and plt count from 10/5-10/6, though Hb and plts remained stable 10/6-10/7. Consider recheck of BMP and CBC at follow-up.  3. Scabies - Pt was treated with Permethrin once on 10/5 while inpt, with some relief of itching of chest/arms. Please address need for second application of Permethrin one week after first treatment as well as need for eradication at home.  4. Depression/anxiety - Please address symptoms/therapy (currently on Zoloft 100 mg daily and Ativan 0.5 mg TID PRN). Pt expressed increased feelings of depression during hospital admission and tended to minimize her symptoms, but denied any further intervention while inpt. Anxiety also likely contributes to habit of itching and may have exacerbated/caused cellulitis. Consider outpt therapy referral?  5. Chronic hep C - Pt had abd ultrasound on 10/7 showing mildly irregular liver contour with heterogenous echotexture, and chronic cholelithiasis, but no cholecystitis or intrahepatic biliary dilation. Pt has been in the process of being referred to [facility]; please address follow-through with this referral  process.  Outstanding Results: none  Discharge Instructions: Please refer to Patient Instructions section of EMR for full details.  Patient was counseled important signs and symptoms that should prompt return to medical care, changes in medications, dietary instructions, activity restrictions, and follow up appointments.       Follow-up Information    Follow up with DE LA CRUZ,IVY, DO. On 10/23/2011. (Appointment time is 9:30 AM.)    Contact information:   [address] [place] [address] [phone]         Discharge Condition: stable  [address] [place], MD 10/16/2011, 8:31 PM   I discussed patient's case with Dr Casper Harrison.  I agree with his plans for discharge documented above. Acquanetta Belling, MD

## 2011-10-16 NOTE — Progress Notes (Signed)
Orthopedic Tech Progress Note Patient Details:  Stacy Campbell 10-Aug-1955 [id number]  Ortho Devices Type of Ortho Device: Radio broadcast assistant Ortho Device/Splint Location: left leg Ortho Device/Splint Interventions: Application   Nikki Dom 10/16/2011, 3:47 PM

## 2011-10-23 ENCOUNTER — Encounter: Payer: Self-pay | Admitting: Family Medicine

## 2011-10-23 ENCOUNTER — Ambulatory Visit (INDEPENDENT_AMBULATORY_CARE_PROVIDER_SITE_OTHER): Payer: Self-pay | Admitting: Family Medicine

## 2011-10-23 VITALS — BP 143/92 | HR 111 | Temp 99.1°F | Wt 219.3 lb

## 2011-10-23 DIAGNOSIS — R21 Rash and other nonspecific skin eruption: Secondary | ICD-10-CM

## 2011-10-23 DIAGNOSIS — I872 Venous insufficiency (chronic) (peripheral): Secondary | ICD-10-CM

## 2011-10-23 DIAGNOSIS — B86 Scabies: Secondary | ICD-10-CM

## 2011-10-23 MED ORDER — PERMETHRIN 5 % EX CREA: TOPICAL_CREAM | Freq: Once | CUTANEOUS | Status: DC

## 2011-10-23 MED ORDER — HYDROXYZINE HCL 10 MG PO TABS: 10.0000 mg | ORAL_TABLET | Freq: Four times a day (QID) | ORAL | Status: DC | PRN

## 2011-10-23 NOTE — Patient Instructions (Signed)
Please call [facility] clinic TODAY and schedule an appointment for Hepatitis. Do not take Tylenol or drink alcoholic beverages.  For skin rash, apply Permethrin cream today for at least 12 hours. Wash all clothes and bed sheets/towels in hot water. We will call you with time and date of appointment for wound care.  I will send more Atarax (Hydroxyzine) to your pharmacy.  You can take this every 6 hours as needed for itching. Schedule follow up appointment with me in 2 weeks.

## 2011-10-24 LAB — RPR

## 2011-10-27 ENCOUNTER — Encounter: Payer: Self-pay | Admitting: Family Medicine

## 2011-10-27 DIAGNOSIS — B86 Scabies: Secondary | ICD-10-CM | POA: Insufficient documentation

## 2011-10-27 NOTE — Assessment & Plan Note (Signed)
Patient treated with permethrin in hospital, but similar rash has erupted. - Repeat permethrin x 8 hours - Advised patient to wash clothes, towels, sheets in hot water (handout given) - Atarax PRN itching

## 2011-10-27 NOTE — Assessment & Plan Note (Signed)
Patient with complex derm history of prurigo nodularis, chronic venous insufficiency, Hep C, and pruritis presents with improving LT lower leg cellulitis. - Unna boots tend to work well, replaced Radio broadcast assistant today, will change dressing in 2 weeks - No evidence of cellulitis or infection today - For itching, Atarax every 6 hours - Continue Xanax TID for prurigo nodularis - Referral for wound care in process - Follow up in 2 weeks

## 2011-10-27 NOTE — Progress Notes (Signed)
  Subjective:    Patient ID: Stacy Campbell, female    DOB: Feb 27, 1955, 56 y.o.   MRN: [id number]  HPI  Patient presents to clinic for hospital follow up.  She was hospitalized for cellulitis of LT lower extremity secondary to superimposed infection from scratching.  She has a complex history of anxiety, prurigo nodularis, Hep C, and chronic venous insufficiency.  In the hospital, she received Vancomycin for cellulitis and then transitioned to PO Clindamycin.  She has 1 more day of antibiotics left.  Unna boot helps keep patient from itching and helps healing process.  She returns today for wound check and dressing change.  Patient denies any fever, chills, nausea/vomiting, or NS.  She endorses a similar rash on bilateral upper extremities and chest that was diagnosed as scabies in the hospital.  She was treated with permethrin in the hospital which seemed to help.  No other family members have a rash.  Described as itchy, but not painful.  Review of Systems  Per HPI    Objective:   Physical Exam Gen: flat affect, no acute distress Skin: small, red, round macular lesions on bilateral arms and chest, not within webs of fingers; LT lower shin erythematous extending medially and laterally, but no active bleeding or ulcers, no pain on palpation; good distal pulses bilaterally     Assessment & Plan:

## 2011-11-05 ENCOUNTER — Telehealth: Payer: Self-pay | Admitting: Family Medicine

## 2011-11-05 ENCOUNTER — Emergency Department (HOSPITAL_COMMUNITY)
Admission: EM | Admit: 2011-11-05 | Discharge: 2011-11-05 | Disposition: A | Discharge status: Home / Self Care | Payer: Medicaid Other | Source: Home / Self Care | Attending: Family Medicine | Admitting: Family Medicine

## 2011-11-05 ENCOUNTER — Encounter (HOSPITAL_COMMUNITY): Payer: Self-pay | Admitting: Emergency Medicine

## 2011-11-05 ENCOUNTER — Encounter: Payer: Self-pay | Admitting: Family Medicine

## 2011-11-05 DIAGNOSIS — I872 Venous insufficiency (chronic) (peripheral): Secondary | ICD-10-CM

## 2011-11-05 DIAGNOSIS — I8310 Varicose veins of unspecified lower extremity with inflammation: Secondary | ICD-10-CM

## 2011-11-05 HISTORY — DX: Insomnia, unspecified: G47.00

## 2011-11-05 HISTORY — DX: Unspecified asthma, uncomplicated: J45.909

## 2011-11-05 MED ORDER — HYDROCODONE-ACETAMINOPHEN 5-325 MG PO TABS
1.0000 | ORAL_TABLET | Freq: Once | ORAL | Status: AC
  Administered 2011-11-05: 1 via ORAL

## 2011-11-05 MED ORDER — HYDROCODONE-ACETAMINOPHEN 5-325 MG PO TABS
ORAL_TABLET | ORAL | Status: AC
  Filled 2011-11-05: qty 1

## 2011-11-05 MED ORDER — HYDROCODONE-ACETAMINOPHEN 5-325 MG PO TABS: 1.0000 | ORAL_TABLET | Freq: Four times a day (QID) | ORAL | Status: DC | PRN

## 2011-11-05 NOTE — Telephone Encounter (Signed)
Records faxed from  office visit 10/15. There was no office visit 10/04. Faxed hospital records from10/04.

## 2011-11-05 NOTE — ED Provider Notes (Signed)
History     CSN: [id number]  Arrival date & time 11/05/11  1440   First MD Initiated Contact with Patient 11/05/11 1527      Chief Complaint  Patient presents with  . Leg Pain    (Consider location/radiation/quality/duration/timing/severity/associated sxs/prior treatment) Patient is a 56 y.o. female presenting with leg pain. The history is provided by the patient.  Leg Pain  The incident occurred more than 2 days ago (hosp from 10/4--8 for cellulitis, has increased pain since thurs with continued drainage.). There was no injury mechanism. The pain is present in the left leg.    Past Medical History  Diagnosis Date  . Hypertension   . Depression   . Anxiety   . Asthma   . Insomnia     History reviewed. No pertinent past surgical history.  Family History  Problem Relation Age of Onset  . Asthma Mother   . Diabetes Mother   . Asthma Father   . Diabetes Father   . Cancer Father     History  Substance Use Topics  . 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 Alcohol Use: No    OB History    Grav Para Term Preterm Abortions TAB SAB Ect Mult Living                  Review of Systems  Constitutional: Negative.   Musculoskeletal: Negative for gait problem.  Skin: Positive for rash.    Allergies  Aspirin  Home Medications   Current Outpatient Rx  Name Route Sig Dispense Refill  . ALBUTEROL SULFATE HFA 108 (90 BASE) MCG/ACT IN AERS Inhalation Inhale 2 puffs into the lungs every 4 (four) hours as needed. Wheezing    . ALPRAZOLAM 0.5 MG PO TABS Oral Take 1 tablet (0.5 mg total) by mouth 3 (three) times daily as needed for sleep or anxiety. 90 tablet 0  . ASPIRIN 81 MG PO CHEW Oral Chew 81 mg by mouth daily before breakfast.     . CARVEDILOL 25 MG PO TABS Oral Take 1 tablet (25 mg total) by mouth 2 (two) times daily. Take 1 pill in the morning, and 1 in the evening 60 tablet 5  . CLINDAMYCIN HCL 150 MG PO CAPS  Oral Take 3 capsules (450 mg total) by mouth every 8 (eight) hours. 33 capsule 0  . CYCLOBENZAPRINE HCL 5 MG PO TABS Oral Take 5 mg by mouth 3 (three) times daily as needed. Muscle spasm    . FUROSEMIDE 20 MG PO TABS Oral Take 20 mg by mouth at bedtime.    Marland Kitchen GABAPENTIN 100 MG PO CAPS Oral Take 100 mg by mouth 3 (three) times daily.    Marland Kitchen HYDROCODONE-ACETAMINOPHEN 10-325 MG PO TABS Oral Take 1-2 tablets by mouth every 6 (six) hours as needed. 30 tablet 0  . HYDROCODONE-ACETAMINOPHEN 5-325 MG PO TABS Oral Take 1 tablet by mouth every 6 (six) hours as needed for pain. 20 tablet 0  . LISINOPRIL-HYDROCHLOROTHIAZIDE 20-25 MG PO TABS Oral Take 1 tablet by mouth daily before breakfast.    . MECLIZINE HCL 25 MG PO TABS Oral Take 1 tablet (25 mg total) by mouth 2 (two) times daily. 30 tablet 3  . PERMETHRIN 5 % EX CREA Topical Apply topically once. 60 g 0  . POTASSIUM CHLORIDE ER 10 MEQ PO TBCR Oral Take 20 mEq by mouth daily as needed.    . SERTRALINE HCL 100  MG PO TABS  take 1 tablet by mouth at bedtime 34 tablet 11  . ZOLPIDEM TARTRATE 5 MG PO TABS Oral Take 1 tablet (5 mg total) by mouth at bedtime as needed for sleep. 30 tablet 0    BP 156/55  Pulse 59  Temp 98.5 F (36.9 C) (Oral)  Resp 16  SpO2 100%  Physical Exam  Nursing note and vitals reviewed. Constitutional: She is oriented to person, place, and time. She appears well-developed and well-nourished.  Musculoskeletal: She exhibits tenderness.  Neurological: She is alert and oriented to person, place, and time.  Skin: Skin is warm and dry.       Stasis dermatitis changes with skin cracking, pulses 2+, no warmth or erythema in spite of diffuse tenderness., no pain with ambulation.    ED Course  Procedures (including critical care time)  Labs Reviewed - No data to display No results found.   1. Chronic stasis dermatitis       MDM          Linna Hoff, MD 11/05/11 [phone]

## 2011-11-05 NOTE — Telephone Encounter (Signed)
Pt is asking to speak to nurse about pain in her leg

## 2011-11-05 NOTE — Telephone Encounter (Signed)
Spoke with patient earlier today and she states pain in leg is worse. Advised since we have no available appointment today to go to Urgent Care  Or ED. She voices understanding.

## 2011-11-05 NOTE — Telephone Encounter (Signed)
Patient has an appt on Wednesday and is hoping for some Pain medication for her legs for until that appt.

## 2011-11-05 NOTE — ED Notes (Signed)
Patient reports left lower leg pain.  Reports being admitted to hospital 10/4-10/8.  Patient reports finishing antibiotics last Wednesday.  Reports pain started again last Thursday.  Since then pain has increased.  No particular increase in pain.  Increase in drainage since discharge from hospital.  Works in dining service at [employer] 

## 2011-11-05 NOTE — Telephone Encounter (Signed)
Hi team - the wound and hyperbaric center needs medical records faxed to their office so they can see her for her wound.  Will you please fax the office visit from 10/12/11.  It has my physical exam that describes her chronic wound.  Thank you!  The fax number is [phone].

## 2011-11-06 NOTE — Telephone Encounter (Signed)
Patient was seen in Urgent Care yesterday and received 20 tablets of Percocet.  That should be enough to last until her appointment. I will discuss this at her appointment tomorrow.

## 2011-11-06 NOTE — Telephone Encounter (Signed)
Will forward to Dr de la cruz 

## 2011-11-07 ENCOUNTER — Ambulatory Visit (INDEPENDENT_AMBULATORY_CARE_PROVIDER_SITE_OTHER): Payer: Medicaid Other | Admitting: Family Medicine

## 2011-11-07 ENCOUNTER — Encounter: Payer: Self-pay | Admitting: Family Medicine

## 2011-11-07 VITALS — BP 159/83 | HR 86 | Temp 98.2°F | Wt 215.0 lb

## 2011-11-07 DIAGNOSIS — B1920 Unspecified viral hepatitis C without hepatic coma: Secondary | ICD-10-CM

## 2011-11-07 DIAGNOSIS — I872 Venous insufficiency (chronic) (peripheral): Secondary | ICD-10-CM

## 2011-11-07 LAB — POCT URINALYSIS DIPSTICK
Bilirubin, UA: NEGATIVE
Glucose, UA: NEGATIVE
Ketones, UA: NEGATIVE
Nitrite, UA: NEGATIVE

## 2011-11-07 LAB — POCT UA - MICROSCOPIC ONLY

## 2011-11-07 MED ORDER — ZOLPIDEM TARTRATE 10 MG PO TABS: 10.0000 mg | ORAL_TABLET | Freq: Every evening | ORAL | Status: DC | PRN

## 2011-11-07 NOTE — Patient Instructions (Addendum)
I am glad your rash and your leg wound appear to be improving. Remember to elevate you leg at rest. I will refer you to [facility] if you cannot make an appointment with the Hepatitis Clinic here. Please call MD if you do get an appointment with the Hepatitis Clinic in [place]. Return to clinic in 2 weeks for follow up.

## 2011-11-11 ENCOUNTER — Encounter (HOSPITAL_COMMUNITY): Payer: Self-pay | Admitting: Emergency Medicine

## 2011-11-11 ENCOUNTER — Emergency Department (HOSPITAL_COMMUNITY)
Admission: EM | Admit: 2011-11-11 | Discharge: 2011-11-11 | Disposition: A | Discharge status: Home / Self Care | Payer: Self-pay | Attending: Emergency Medicine | Admitting: Emergency Medicine

## 2011-11-11 ENCOUNTER — Emergency Department (HOSPITAL_COMMUNITY)
Admission: EM | Admit: 2011-11-11 | Discharge: 2011-11-11 | Disposition: A | Discharge status: Other Acute Inpatient Hospital | Payer: Self-pay | Source: Home / Self Care | Attending: Emergency Medicine | Admitting: Emergency Medicine

## 2011-11-11 DIAGNOSIS — L02419 Cutaneous abscess of limb, unspecified: Secondary | ICD-10-CM | POA: Insufficient documentation

## 2011-11-11 DIAGNOSIS — J45909 Unspecified asthma, uncomplicated: Secondary | ICD-10-CM | POA: Insufficient documentation

## 2011-11-11 DIAGNOSIS — F341 Dysthymic disorder: Secondary | ICD-10-CM | POA: Insufficient documentation

## 2011-11-11 DIAGNOSIS — Z79899 Other long term (current) drug therapy: Secondary | ICD-10-CM | POA: Insufficient documentation

## 2011-11-11 DIAGNOSIS — I10 Essential (primary) hypertension: Secondary | ICD-10-CM | POA: Insufficient documentation

## 2011-11-11 DIAGNOSIS — I8310 Varicose veins of unspecified lower extremity with inflammation: Secondary | ICD-10-CM

## 2011-11-11 DIAGNOSIS — I878 Other specified disorders of veins: Secondary | ICD-10-CM

## 2011-11-11 DIAGNOSIS — G4700 Insomnia, unspecified: Secondary | ICD-10-CM | POA: Insufficient documentation

## 2011-11-11 DIAGNOSIS — L0390 Cellulitis, unspecified: Secondary | ICD-10-CM

## 2011-11-11 DIAGNOSIS — Z7982 Long term (current) use of aspirin: Secondary | ICD-10-CM | POA: Insufficient documentation

## 2011-11-11 DIAGNOSIS — I872 Venous insufficiency (chronic) (peripheral): Secondary | ICD-10-CM

## 2011-11-11 DIAGNOSIS — Z87891 Personal history of nicotine dependence: Secondary | ICD-10-CM | POA: Insufficient documentation

## 2011-11-11 DIAGNOSIS — L03119 Cellulitis of unspecified part of limb: Secondary | ICD-10-CM | POA: Insufficient documentation

## 2011-11-11 LAB — BASIC METABOLIC PANEL
BUN: 7 mg/dL (ref 6–23)
Calcium: 9.5 mg/dL (ref 8.4–10.5)
GFR calc Af Amer: >90 mL/min (ref >90)
GFR calc non Af Amer: >90 mL/min (ref >90)
Glucose, Bld: 112 mg/dL — ABNORMAL HIGH (ref 70–99)
Sodium: 143 mEq/L (ref 135–145)

## 2011-11-11 LAB — CBC WITH DIFFERENTIAL/PLATELET
Basophils Relative: 0 % (ref 0–1)
Eosinophils Absolute: 0.4 10*3/uL (ref 0.0–0.7)
Eosinophils Relative: 7 % — ABNORMAL HIGH (ref 0–5)
Lymphs Abs: 1.6 10*3/uL (ref 0.7–4.0)
MCH: 26.6 pg (ref 26.0–34.0)
MCHC: 32.9 g/dL (ref 30.0–36.0)
MCV: 80.9 fL (ref 78.0–100.0)
Platelets: 143 10*3/uL — ABNORMAL LOW (ref 150–400)
RBC: 5.18 MIL/uL — ABNORMAL HIGH (ref 3.87–5.11)

## 2011-11-11 MED ORDER — MORPHINE SULFATE 4 MG/ML IJ SOLN
4.0000 mg | Freq: Once | INTRAMUSCULAR | Status: DC
Start: 2011-11-11 — End: 2011-11-11

## 2011-11-11 MED ORDER — OXYCODONE-ACETAMINOPHEN 5-325 MG PO TABS
1.0000 | ORAL_TABLET | Freq: Once | ORAL | Status: AC
  Administered 2011-11-11: 1 via ORAL
  Filled 2011-11-11: qty 1

## 2011-11-11 MED ORDER — MORPHINE SULFATE 4 MG/ML IJ SOLN
6.0000 mg | Freq: Once | INTRAMUSCULAR | Status: AC
  Administered 2011-11-11: 6 mg via INTRAMUSCULAR
  Filled 2011-11-11 (×2): qty 1

## 2011-11-11 MED ORDER — OXYCODONE-ACETAMINOPHEN 5-325 MG PO TABS: 2.0000 | ORAL_TABLET | Freq: Four times a day (QID) | ORAL | Status: DC | PRN

## 2011-11-11 MED ORDER — CLINDAMYCIN HCL 150 MG PO CAPS: 150.0000 mg | ORAL_CAPSULE | Freq: Four times a day (QID) | ORAL | Status: DC

## 2011-11-11 MED ORDER — ONDANSETRON 4 MG PO TBDP
4.0000 mg | ORAL_TABLET | Freq: Once | ORAL | Status: AC
  Administered 2011-11-11: 4 mg via ORAL
  Filled 2011-11-11: qty 1

## 2011-11-11 NOTE — ED Notes (Signed)
Right Leg dressing applied.

## 2011-11-11 NOTE — ED Notes (Signed)
Reports cellulitis to the left leg.

## 2011-11-11 NOTE — ED Provider Notes (Signed)
Patient with recurrent cellulitis of her left lower extremity for the past 2 years. She's had increased pain since yesterday. Denies fever denies vomiting. Reports increased redness in her leg for approximately the past 2 weeks. On exam alert nontoxic not ill-appearing left lower extremity skin reddened and stocking glove fashion at distal two thirds of leg and dorsum of foot DP pulses 2+  Doug Sou, MD 11/11/11 1330

## 2011-11-11 NOTE — ED Notes (Signed)
Pt c/o increased pain in left leg from recurrent cellulitis to leg; pt sts out of pain meds and having increased pain; pt sts wounds present x 5 years

## 2011-11-11 NOTE — ED Provider Notes (Signed)
Medical screening examination/treatment/procedure(s) were conducted as a shared visit with non-physician practitioner(s) and myself.  I personally evaluated the patient during the encounter  Doug Sou, MD 11/11/11 1643

## 2011-11-11 NOTE — ED Notes (Signed)
Pt with swollen, red, and tender LLE,  +pedal pulse with brisk cap refill in toes..  Pt advises she has had problems with her leg and infections since 2005.

## 2011-11-11 NOTE — ED Provider Notes (Signed)
Chief Complaint  Patient presents with  . Recurrent Skin Infections    History of Present Illness:   The patient is a 56 year old female who has had a long-standing history of a severe stasis dermatitis on her left lower leg. This is complicated by hepatitis C and prurigo nodularis. She's had swelling and weeping of the legs for a long time. She was hospitalized about a month ago at [facility] for possible cellulitis. It was a little bit better after her hospital stay but then seemed to get worse. She was here a week ago, this past Monday and an Unna boot was put on. She was prescribed some antibiotics. The Unna boot was removed this past Friday, 3 days ago and she was told to stop the antibiotics. She was given an appointment to come back later this month. She feels she cannot make it until then. She still has pain, swelling, and burning of the lower leg and whether she is on her feet or of her feet. She denies any fever or chills. There is no numbness or tingling.  Review of Systems:  Other than noted above, the patient denies any of the following symptoms: Systemic:  No fever, chills, sweats, weight loss, or fatigue. ENT:  No nasal congestion, rhinorrhea, sore throat, swelling of lips, tongue or throat. Resp:  No cough, wheezing, or shortness of breath. Skin:  No rash, itching, nodules, or suspicious lesions.  PMFSH:  Past medical history, family history, social history, meds, and allergies were reviewed.  Physical Exam:   Vital signs:  BP 181/101  Pulse 118  Temp 99.4 F (37.4 C) (Oral)  Resp 22  SpO2 99% Gen:  Alert, oriented, in no distress. ENT:  Pharynx clear, no intraoral lesions, moist mucous membranes. Lungs:  Clear to auscultation. Skin:  The entire left lower leg he is swollen, erythematous, cracked, ulcerated, and weeping, clear serous fluid. There is no purulent drainage. The entire lower leg is tender to palpation. This is circumferential, but she has intact pedal pulses,  normal sensation, and is able to move her toes normally.    Assessment:  The primary encounter diagnosis was Stasis dermatitis. A diagnosis of Cellulitis was also pertinent to this visit.  Plan:   1.  The following meds were prescribed:   New Prescriptions   No medications on file   2.  The patient was transferred to the emergency department via shuttle for further evaluation.    Reuben Likes, MD 11/11/11 [id number]

## 2011-11-11 NOTE — ED Provider Notes (Signed)
History     CSN: [id number]  Arrival date & time 11/11/11  1125   First MD Initiated Contact with Patient 11/11/11 1202      Chief Complaint  Patient presents with  . Cellulitis    (Consider location/radiation/quality/duration/timing/severity/associated sxs/prior treatment) HPI   Patient to the emergency department for evaluation of left lower extremity cellulitis. She was originally seen at the urgent care and sent to the ER by shuttle. She endorses that her pain has been getting worse over the past two weeks. She was in the hospital a month ago for the same, given IV Vanc and dc with Clindamycin. She has finished her abx and is no longer on any. She Had an [place] boot, which was also recently discontinued. She feels that the infection is coming back because her leg now hurts and has been weeping. She has not had fevers, weakness, vomiting, nausea, chills. nad    Past Medical History  Diagnosis Date  . Hypertension   . Depression   . Anxiety   . Asthma   . Insomnia     History reviewed. No pertinent past surgical history.  Family History  Problem Relation Age of Onset  . Asthma Mother   . Diabetes Mother   . Asthma Father   . Diabetes Father   . Cancer Father     History  Substance Use Topics  . 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 Alcohol Use: No    OB History    Grav Para Term Preterm Abortions TAB SAB Ect Mult Living                  Review of Systems  Review of Systems  Gen: no weight loss, fevers, chills, night sweats  Eyes: no discharge or drainage, no occular pain or visual changes  Nose: no epistaxis or rhinorrhea  Mouth: no dental pain, no sore throat  Neck: no neck pain  Lungs:No wheezing, coughing or hemoptysis CV: no chest pain, palpitations, dependent edema or orthopnea  Abd: no abdominal pain, nausea, vomiting  GU: no dysuria or gross hematuria  MSK:  Left lower extremity chronic  cellulitis  Neuro: no headache, no focal neurologic deficits  Skin: no abnormalities Psyche: negative.   Allergies  Aspirin  Home Medications   Current Outpatient Rx  Name  Route  Sig  Dispense  Refill  . ALBUTEROL SULFATE HFA 108 (90 BASE) MCG/ACT IN AERS   Inhalation   Inhale 2 puffs into the lungs every 4 (four) hours as needed. Wheezing         . ALPRAZOLAM 0.5 MG PO TABS   Oral   Take 1 tablet (0.5 mg total) by mouth 3 (three) times daily as needed for sleep or anxiety.   90 tablet   0   . ASPIRIN 81 MG PO CHEW   Oral   Chew 81 mg by mouth daily before breakfast.          . BC HEADACHE POWDER PO   Oral   Take 1 packet by mouth every 8 (eight) hours as needed. For headache         . CARVEDILOL 25 MG PO TABS   Oral   Take 1 tablet (25 mg total) by mouth 2 (two) times daily. Take 1 pill in the morning, and 1 in the evening   60 tablet   5   . CYCLOBENZAPRINE HCL 5 MG PO  TABS   Oral   Take 5 mg by mouth 3 (three) times daily as needed. Muscle spasm         . FUROSEMIDE 20 MG PO TABS   Oral   Take 20 mg by mouth at bedtime.         Marland Kitchen GABAPENTIN 100 MG PO CAPS   Oral   Take 100 mg by mouth as needed. For nerve pain         . HYDROCODONE-ACETAMINOPHEN 5-325 MG PO TABS   Oral   Take 1 tablet by mouth every 6 (six) hours as needed for pain.   20 tablet   0   . LISINOPRIL-HYDROCHLOROTHIAZIDE 20-25 MG PO TABS   Oral   Take 1 tablet by mouth daily before breakfast.         . MECLIZINE HCL 25 MG PO TABS   Oral   Take 1 tablet (25 mg total) by mouth 2 (two) times daily.   30 tablet   3   . POTASSIUM CHLORIDE ER 10 MEQ PO TBCR   Oral   Take 20 mEq by mouth daily as needed. For low K         . ZOLPIDEM TARTRATE 10 MG PO TABS   Oral   Take 1 tablet (10 mg total) by mouth at bedtime as needed for sleep.   30 tablet   0     To be filled on 11/10/2011   . SERTRALINE HCL 100 MG PO TABS      take 1 tablet by mouth at bedtime   34 tablet    11     BP 184/87  Pulse 74  Temp 98.9 F (37.2 C) (Oral)  Resp 16  SpO2 98%  Physical Exam  Nursing note and vitals reviewed. Constitutional: She appears well-developed and well-nourished. No distress.  HENT:  Head: Normocephalic and atraumatic.  Eyes: Pupils are equal, round, and reactive to light.  Neck: Normal range of motion. Neck supple.  Cardiovascular: Normal rate and regular rhythm.   Pulmonary/Chest: Effort normal.  Abdominal: Soft.  Musculoskeletal:       Left lower leg: She exhibits tenderness and swelling. She exhibits no bony tenderness, no edema, no deformity and no laceration.       Legs: Neurological: She is alert.  Skin: Skin is warm and dry.    ED Course  Procedures (including critical care time)  Labs Reviewed  CBC WITH DIFFERENTIAL - Abnormal; Notable for the following:    RBC 5.18 (*)     Platelets 143 (*)     Eosinophils Relative 7 (*)     All other components within normal limits  BASIC METABOLIC PANEL - Abnormal; Notable for the following:    Glucose, Bld 112 (*)     All other components within normal limits   No results found.   1. Cellulitis   2. Venous stasis of lower extremity       MDM  Dr. Ethelda Chick has seen patient also. Pain medication given. This is a chronic condition and hse is afebrile with no white count.   Pts pain returned, 6mg  IM morphine ordered.  Pt to be started on Oral Clinda again. Pain medications Rx. Family medicine will see patient at 10:00am on Monday morning  Pt has been advised of the symptoms that warrant their return to the ED. Patient has voiced understanding and has agreed to follow-up with the PCP or specialist.  Dorthula Matas, PA 11/11/11 1523

## 2011-11-12 ENCOUNTER — Encounter: Payer: Self-pay | Admitting: Family Medicine

## 2011-11-12 ENCOUNTER — Telehealth: Payer: Self-pay | Admitting: *Deleted

## 2011-11-12 ENCOUNTER — Ambulatory Visit (INDEPENDENT_AMBULATORY_CARE_PROVIDER_SITE_OTHER): Payer: Self-pay | Admitting: Family Medicine

## 2011-11-12 VITALS — BP 141/94 | HR 118 | Temp 99.2°F | Ht 65.0 in | Wt 208.0 lb

## 2011-11-12 DIAGNOSIS — L0390 Cellulitis, unspecified: Secondary | ICD-10-CM

## 2011-11-12 DIAGNOSIS — L0291 Cutaneous abscess, unspecified: Secondary | ICD-10-CM

## 2011-11-12 DIAGNOSIS — I872 Venous insufficiency (chronic) (peripheral): Secondary | ICD-10-CM

## 2011-11-12 MED ORDER — HYDROCODONE-ACETAMINOPHEN 5-325 MG PO TABS: 1.0000 | ORAL_TABLET | Freq: Four times a day (QID) | ORAL | Status: DC | PRN

## 2011-11-12 NOTE — Progress Notes (Signed)
  Subjective:    Patient ID: Stacy Campbell, female    DOB: 12/23/1955, 56 y.o.   MRN: [id number]  HPI  Patient presents to clinic to discuss Hepatitis C dx and follow up LT lower extremity cellulitis.  Hepatitis C: Patient has been seen at the Hepatitis C clinic in [place].  She called for a follow up appointment and was told that they do not accept the orange card.  There are physicians from [facility] who come to [place] once or twice per month, but patient will have to pay cash for the first visit.  She cannot afford this on her own, but has asked her family for financial assistance.  Because she does not have insurance, I mentioned a referral to [facility], but patient does not have any way of driving to [facility].  She would rather be seen in [place] if her family can help pay for the visits.  Additionally, patient's labs from Hep C clinic revealed a positive ANA and elevated titers.  She will also need a referral to Rheumatology, but Hepatitis clinic is her first priority.  LT lower leg cellulitis:  Patient was seen in Urgent Care center, unna boot removed.  Per patient, wound was red but looked better than before.  Unna boot was re-applied.  Patient complains of throbbing pain with ambulation only.  She has returned to work [employer]) and is on her feet most of the time.  I discussed FMLA with patient, but she is worried that her job might fire her.  We discussed lighter duties as well, but patient again is worried that she will lose her job.  Review of Systems  Per HPI    Objective:   Physical Exam  Constitutional: No distress.       Obese  Musculoskeletal: She exhibits no edema.  Skin:       Malar rash on face; LT lower extremity dressed and dry, unna boot in place; lower extremity edema has improved significantly          Assessment & Plan:

## 2011-11-12 NOTE — Assessment & Plan Note (Signed)
Continue clindamycin q6hr as Rx'ed.  If not improving, may want to consider broadening abx to also cover pseudomonas. Wound care clinic appt 12/09/11 but pt on list to be called for sooner cancellation.

## 2011-11-12 NOTE — Telephone Encounter (Signed)
Called wound ctr and scheduled appt for pt Mon 12/10/11 at 1 pm. Arrive at 12:45 pm [name] Ph # [phone] Pt informed of appt. Lorenda Hatchet, Debby Clyne P.s. Pt is also on the cancellation list.

## 2011-11-12 NOTE — Assessment & Plan Note (Signed)
Patient called Hepatitis Clinic in [place] and was told she would have to be seen in [facility] because they do not accept orange card.  I discussed recent lab work with Dr. Sheffield Slider and we agreed patient will need a referral to [facility] to see a GI specialist and Rheumatologist (elevated ANA).  Patient is in the process of asking her family for financial assistance. - Referral ordered for [facility] - Once patient is seen at [facility], hopefully she will be referred to Rheumatology as well - If patient's family will help pay for Hep C clinic in [place], patient to let me know so I can D/C referral to [facility]

## 2011-11-12 NOTE — Assessment & Plan Note (Signed)
Unna boot recently changed in Urgent Care center.  Referral to wound care in process.

## 2011-11-12 NOTE — Assessment & Plan Note (Signed)
Significant skin changes, present for years per pt.  Refilled hydrocodone.  F/u 9 days with PCP. Unna boot applied today.  Wound care clinic appt 12/09/11 but pt on list to be called for sooner cancellation.

## 2011-11-12 NOTE — Patient Instructions (Signed)
It was nice to meet you today.  We are wrapping your leg up.  Leave it in place until you follow up with Dr. Tye Savoy on 11/21/11 at 2pm.  Keep taking your antibiotics 4 times per day.  If the wound center does not call you, keep your appointment for 12/09/11.  Come back sooner than the 13th if you feel like your leg is looking worse, you start having fevers or drainage, or your pain gets much worse.

## 2011-11-12 NOTE — Progress Notes (Signed)
S: Pt comes in today for SDA for left leg rash.  Was seen in ED yesterday for the same and started on po clinda and given percocet for pain control.  Patient with long history of LLE rash/venous insufficiency/cellulitis.  Was admitted from 10/4-10/8 for similar.  Has appt with wound clinic 12/09/11.  Had unna boot placed 1 week ago, which was removed 3 days ago here in clinic (on Friday).  No antibiotics until yesterday in the ED.  Went to the ED because the pain got worse.  No fevers/ chills.  + oozing and worsening redness.  No N/V/D or other systemic symptoms.  Was given pain medicine and antibiotic yesterday.  Does not like the percocet because of itching, wold prefer vicodin.  Feels like her leg is about the same-- not more red or painful than yesterday.  Is taking her clinda q6hr as Rx'ed.     ROS: Per HPI  History  Smoking status  . Former Smoker  Smokeless tobacco  . Former Neurosurgeon  . Quit date: 03/30/2008    O:  Filed Vitals:   11/12/11 1008  BP: 141/94  Pulse: 118  Temp: 99.2 F (37.3 C)    Gen: NAD Ext: Warm, R left with ?chronic folliculitis and multiple scars; L leg witih significant erythema, venous statsis changes, and post inflammatory changes of distal 2/3 lower extremity; does not go up to knee; + exfoliation/breaks in scabbing over proximal 1/3 of wound   A/P: 56 y.o. female p/w likely chronic venous stasis changes with possible overlying skin breakdown and erythema -See problem list -f/u in 9 days as scheduled

## 2011-11-17 ENCOUNTER — Emergency Department (HOSPITAL_COMMUNITY)
Admission: EM | Admit: 2011-11-17 | Discharge: 2011-11-17 | Disposition: A | Discharge status: Home / Self Care | Payer: Self-pay | Attending: Emergency Medicine | Admitting: Emergency Medicine

## 2011-11-17 ENCOUNTER — Encounter (HOSPITAL_COMMUNITY): Payer: Self-pay | Admitting: Emergency Medicine

## 2011-11-17 DIAGNOSIS — F329 Major depressive disorder, single episode, unspecified: Secondary | ICD-10-CM | POA: Insufficient documentation

## 2011-11-17 DIAGNOSIS — I10 Essential (primary) hypertension: Secondary | ICD-10-CM | POA: Insufficient documentation

## 2011-11-17 DIAGNOSIS — Z7982 Long term (current) use of aspirin: Secondary | ICD-10-CM | POA: Insufficient documentation

## 2011-11-17 DIAGNOSIS — R21 Rash and other nonspecific skin eruption: Secondary | ICD-10-CM | POA: Insufficient documentation

## 2011-11-17 DIAGNOSIS — G4700 Insomnia, unspecified: Secondary | ICD-10-CM | POA: Insufficient documentation

## 2011-11-17 DIAGNOSIS — Z87891 Personal history of nicotine dependence: Secondary | ICD-10-CM | POA: Insufficient documentation

## 2011-11-17 DIAGNOSIS — J45909 Unspecified asthma, uncomplicated: Secondary | ICD-10-CM | POA: Insufficient documentation

## 2011-11-17 DIAGNOSIS — F411 Generalized anxiety disorder: Secondary | ICD-10-CM | POA: Insufficient documentation

## 2011-11-17 DIAGNOSIS — F3289 Other specified depressive episodes: Secondary | ICD-10-CM | POA: Insufficient documentation

## 2011-11-17 DIAGNOSIS — Z79899 Other long term (current) drug therapy: Secondary | ICD-10-CM | POA: Insufficient documentation

## 2011-11-17 MED ORDER — FAMOTIDINE 20 MG PO TABS: 20.0000 mg | ORAL_TABLET | Freq: Two times a day (BID) | ORAL | Status: DC

## 2011-11-17 MED ORDER — FAMOTIDINE 20 MG PO TABS
20.0000 mg | ORAL_TABLET | Freq: Once | ORAL | Status: AC
  Administered 2011-11-17: 20 mg via ORAL
  Filled 2011-11-17: qty 1

## 2011-11-17 NOTE — ED Notes (Signed)
Patient complaining of rash all over body and itching that started two weeks ago; reports that she recently started taking Hydrocodone (this is the first time she has ever taken it); assumes that she is allergic to hydrocodone.

## 2011-11-17 NOTE — ED Provider Notes (Signed)
History     CSN: [id number]  Arrival date & time 11/17/11  2024   First MD Initiated Contact with Patient 11/17/11 2042      Chief Complaint  Patient presents with  . Rash    (Consider location/radiation/quality/duration/timing/severity/associated sxs/prior treatment) HPI Comments: Patient states she has had a generalized.  Rash for approximately 2 weeks after starting Vicodin.  She does continue to take the Vicodin.  Despite her suspicion that this is what is causing her rash.  She has been seen in the emergency department, and primary care physician for this rash do to the extensive scratching.  She was started on clindamycin for superinfection.  She, states, that the rash is looking better, but is still quite itchy.  She is intermittently taking hydroxyzine, without much relief.  She has an appointment with her primary care physician on Wednesday Or her last hospitalization.  It was presumed that she had scabies, and she was treated for same, but there are no other family members with this rash  Patient is a 56 y.o. female presenting with rash. The history is provided by the patient.  Rash  This is a recurrent problem. The current episode started more than 1 week ago. The problem has been gradually improving. The problem is associated with nothing.    Past Medical History  Diagnosis Date  . Hypertension   . Depression   . Anxiety   . Asthma   . Insomnia     History reviewed. No pertinent past surgical history.  Family History  Problem Relation Age of Onset  . Asthma Mother   . Diabetes Mother   . Asthma Father   . Diabetes Father   . Cancer Father     History  Substance Use Topics  . 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 Alcohol Use: No    OB History    Grav Para Term Preterm Abortions TAB SAB Ect Mult Living                  Review of Systems  Constitutional: Negative for fever and chills.  HENT:  Negative for rhinorrhea.   Respiratory: Negative for cough and shortness of breath.   Cardiovascular: Negative for leg swelling.  Skin: Positive for rash. Negative for wound.  Neurological: Negative for dizziness and headaches.    Allergies  Aspirin  Home Medications   Current Outpatient Rx  Name  Route  Sig  Dispense  Refill  . ALBUTEROL SULFATE HFA 108 (90 BASE) MCG/ACT IN AERS   Inhalation   Inhale 2 puffs into the lungs every 4 (four) hours as needed. Wheezing         . ALPRAZOLAM 0.5 MG PO TABS   Oral   Take 1 tablet (0.5 mg total) by mouth 3 (three) times daily as needed for sleep or anxiety.   90 tablet   0   . ASPIRIN 81 MG PO CHEW   Oral   Chew 81 mg by mouth daily before breakfast.          . BC HEADACHE POWDER PO   Oral   Take 1 packet by mouth every 8 (eight) hours as needed. For headache         . CARVEDILOL 25 MG PO TABS   Oral   Take 1 tablet (25 mg total) by mouth 2 (two) times daily. Take 1 pill in the morning, and 1 in the  evening   60 tablet   5   . CLINDAMYCIN HCL 150 MG PO CAPS   Oral   Take 1 capsule (150 mg total) by mouth every 6 (six) hours.   28 capsule   0   . CYCLOBENZAPRINE HCL 5 MG PO TABS   Oral   Take 5 mg by mouth 3 (three) times daily as needed. Muscle spasm         . FAMOTIDINE 20 MG PO TABS   Oral   Take 1 tablet (20 mg total) by mouth 2 (two) times daily.   40 tablet   0   . FUROSEMIDE 20 MG PO TABS   Oral   Take 20 mg by mouth at bedtime.         Marland Kitchen GABAPENTIN 100 MG PO CAPS   Oral   Take 100 mg by mouth as needed. For nerve pain         . HYDROCODONE-ACETAMINOPHEN 5-325 MG PO TABS   Oral   Take 1 tablet by mouth every 6 (six) hours as needed for pain.   20 tablet   0   . LISINOPRIL-HYDROCHLOROTHIAZIDE 20-25 MG PO TABS   Oral   Take 1 tablet by mouth daily before breakfast.         . MECLIZINE HCL 25 MG PO TABS   Oral   Take 1 tablet (25 mg total) by mouth 2 (two) times daily.   30 tablet    3   . OXYCODONE-ACETAMINOPHEN 5-325 MG PO TABS   Oral   Take 2 tablets by mouth every 6 (six) hours as needed for pain.   14 tablet   0   . POTASSIUM CHLORIDE ER 10 MEQ PO TBCR   Oral   Take 20 mEq by mouth daily as needed. For low K         . SERTRALINE HCL 100 MG PO TABS      take 1 tablet by mouth at bedtime   34 tablet   11   . ZOLPIDEM TARTRATE 10 MG PO TABS   Oral   Take 1 tablet (10 mg total) by mouth at bedtime as needed for sleep.   30 tablet   0     To be filled on 11/10/2011     BP 122/90  Pulse 91  Temp 97.8 F (36.6 C) (Oral)  Resp 18  SpO2 98%  Physical Exam  Constitutional: She is oriented to person, place, and time. She appears well-developed and well-nourished.  HENT:  Head: Normocephalic.  Eyes: Pupils are equal, round, and reactive to light.  Neck: Normal range of motion.  Cardiovascular: Normal rate.   Pulmonary/Chest: Effort normal. No respiratory distress. She has no wheezes.  Abdominal: Soft.  Musculoskeletal: She exhibits no edema and no tenderness.  Neurological: She is alert and oriented to person, place, and time.  Skin: Rash noted.       Extensive generalized.  Rash over extremities, trunk, buttock, many, many scabbed over areas, none of these look infected.  There is no surrounding erythema.  There is no rash between the toes or fingers indicating scabies.  Other family members are not affected by this rash    ED Course  Procedures (including critical care time)  Labs Reviewed - No data to display No results found.   1. Rash and nonspecific skin eruption       MDM   \ Patient has tried Benadryl, and Vistaril, without relief of her symptoms.  I prescribed, Pepcid, 20 mg twice a day to see if this alleviates some of her itching.  She does have an appointment on Wednesday with her primary care physician        Arman Filter, NP 11/17/11 2109

## 2011-11-18 ENCOUNTER — Emergency Department (HOSPITAL_COMMUNITY)
Admission: EM | Admit: 2011-11-18 | Discharge: 2011-11-18 | Disposition: A | Discharge status: Home / Self Care | Payer: Self-pay | Attending: Emergency Medicine | Admitting: Emergency Medicine

## 2011-11-18 ENCOUNTER — Encounter (HOSPITAL_COMMUNITY): Payer: Self-pay

## 2011-11-18 DIAGNOSIS — I10 Essential (primary) hypertension: Secondary | ICD-10-CM | POA: Insufficient documentation

## 2011-11-18 DIAGNOSIS — L309 Dermatitis, unspecified: Secondary | ICD-10-CM

## 2011-11-18 DIAGNOSIS — L0291 Cutaneous abscess, unspecified: Secondary | ICD-10-CM | POA: Insufficient documentation

## 2011-11-18 DIAGNOSIS — G4700 Insomnia, unspecified: Secondary | ICD-10-CM | POA: Insufficient documentation

## 2011-11-18 DIAGNOSIS — Z8739 Personal history of other diseases of the musculoskeletal system and connective tissue: Secondary | ICD-10-CM | POA: Insufficient documentation

## 2011-11-18 DIAGNOSIS — L0390 Cellulitis, unspecified: Secondary | ICD-10-CM

## 2011-11-18 DIAGNOSIS — L299 Pruritus, unspecified: Secondary | ICD-10-CM | POA: Insufficient documentation

## 2011-11-18 DIAGNOSIS — I872 Venous insufficiency (chronic) (peripheral): Secondary | ICD-10-CM | POA: Insufficient documentation

## 2011-11-18 DIAGNOSIS — Z79899 Other long term (current) drug therapy: Secondary | ICD-10-CM | POA: Insufficient documentation

## 2011-11-18 DIAGNOSIS — Z87891 Personal history of nicotine dependence: Secondary | ICD-10-CM | POA: Insufficient documentation

## 2011-11-18 DIAGNOSIS — J45909 Unspecified asthma, uncomplicated: Secondary | ICD-10-CM | POA: Insufficient documentation

## 2011-11-18 DIAGNOSIS — Z872 Personal history of diseases of the skin and subcutaneous tissue: Secondary | ICD-10-CM | POA: Insufficient documentation

## 2011-11-18 DIAGNOSIS — F341 Dysthymic disorder: Secondary | ICD-10-CM | POA: Insufficient documentation

## 2011-11-18 DIAGNOSIS — L259 Unspecified contact dermatitis, unspecified cause: Secondary | ICD-10-CM | POA: Insufficient documentation

## 2011-11-18 HISTORY — DX: Enthesopathy, unspecified: M77.9

## 2011-11-18 HISTORY — DX: Dermatitis, unspecified: L30.9

## 2011-11-18 HISTORY — DX: Unspecified osteoarthritis, unspecified site: M19.90

## 2011-11-18 LAB — CBC WITH DIFFERENTIAL/PLATELET
Basophils Relative: 1 % (ref 0–1)
Eosinophils Absolute: 0.2 10*3/uL (ref 0.0–0.7)
Eosinophils Relative: 4 % (ref 0–5)
MCH: 26 pg (ref 26.0–34.0)
MCHC: 32.5 g/dL (ref 30.0–36.0)
MCV: 79.9 fL (ref 78.0–100.0)
Monocytes Relative: 6 % (ref 3–12)
Neutrophils Relative %: 63 % (ref 43–77)
Platelets: 189 10*3/uL (ref 150–400)

## 2011-11-18 LAB — BASIC METABOLIC PANEL
BUN: 11 mg/dL (ref 6–23)
Calcium: 9.7 mg/dL (ref 8.4–10.5)
GFR calc Af Amer: >90 mL/min (ref >90)
GFR calc non Af Amer: >90 mL/min (ref >90)
Potassium: 3.4 mEq/L — ABNORMAL LOW (ref 3.5–5.1)
Sodium: 139 mEq/L (ref 135–145)

## 2011-11-18 MED ORDER — DIPHENHYDRAMINE HCL 50 MG/ML IJ SOLN
50.0000 mg | Freq: Once | INTRAMUSCULAR | Status: AC
  Administered 2011-11-18: 50 mg via INTRAVENOUS
  Filled 2011-11-18: qty 1

## 2011-11-18 MED ORDER — METHYLPREDNISOLONE SODIUM SUCC 125 MG IJ SOLR
125.0000 mg | Freq: Once | INTRAMUSCULAR | Status: AC
  Administered 2011-11-18: 125 mg via INTRAVENOUS
  Filled 2011-11-18: qty 2

## 2011-11-18 MED ORDER — PERCOCET 5-325 MG PO TABS: 1.0000 | ORAL_TABLET | Freq: Four times a day (QID) | ORAL | Status: DC | PRN

## 2011-11-18 MED ORDER — PREDNISONE 50 MG PO TABS: 50.0000 mg | ORAL_TABLET | Freq: Every day | ORAL | Status: DC

## 2011-11-18 MED ORDER — OXYCODONE-ACETAMINOPHEN 5-325 MG PO TABS
1.0000 | ORAL_TABLET | Freq: Once | ORAL | Status: AC
  Administered 2011-11-18: 1 via ORAL
  Filled 2011-11-18: qty 1

## 2011-11-18 NOTE — ED Notes (Signed)
IV team responded  

## 2011-11-18 NOTE — ED Notes (Signed)
Paged IV team 

## 2011-11-18 NOTE — ED Notes (Addendum)
Pt. Taking hydrocodone for her lt. Leg cellulitis and she has developed rash on he face upper torso, hands, arms and legs.  Also her lt. Lower leg cellultis is not improved. Swollen, red and open sores.   Pt. Very tearful . Airway intactm, no sob.  Pt. Did take Benadryl 2 tabs around 1000 am

## 2011-11-18 NOTE — ED Provider Notes (Signed)
History     CSN: [id number]  Arrival date & time 11/18/11  1612   First MD Initiated Contact with Patient 11/18/11 1721      Chief Complaint  Patient presents with  . Rash    (Consider location/radiation/quality/duration/timing/severity/associated sxs/prior treatment) HPI Comments: Pt w a hx of HTN, PVD, & eczema present to ER w multiple complaints. Pt  Has recently been evaluated for LLE venous stasis/ cellulitis being treated w clindamycin and pending follow up with the wound clinic. Pt states that this has worsened since returning to work where she has to stand for long hours. IN addition she has developed a rash that has been gradually worsening over the last two weeks. She has been treated for scabies, evaluated by hospitalist and PCP, with out any relief or diagnosis.   Patient is a 56 y.o. female presenting with rash. The history is provided by the patient.  Rash  This is a recurrent problem. The current episode started more than 1 week ago. The problem is associated with an unknown factor. Affected Location: arms, legs, trunk, back & around mouth. The pain is at a severity of 6/10. The pain is moderate. The pain has been constant since onset. Associated symptoms include itching and pain. She has tried antihistamines (clindamycin abx, atarax) for the symptoms. The treatment provided no relief.    Past Medical History  Diagnosis Date  . Hypertension   . Depression   . Anxiety   . Asthma   . Insomnia   . Eczema   . Arthritis   . Bone spur     spine    History reviewed. No pertinent past surgical history.  Family History  Problem Relation Age of Onset  . Asthma Mother   . Diabetes Mother   . Asthma Father   . Diabetes Father   . Cancer Father     History  Substance Use Topics  . 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 Alcohol Use: No    OB History    Grav Para Term Preterm Abortions TAB SAB Ect Mult Living                   Review of Systems  Constitutional: Negative for fever, chills and appetite change.  HENT: Negative for congestion.   Eyes: Negative for visual disturbance.  Respiratory: Negative for shortness of breath.   Cardiovascular: Negative for chest pain and leg swelling.  Gastrointestinal: Negative for abdominal pain.  Genitourinary: Negative for dysuria, urgency and frequency.  Skin: Positive for color change, itching and rash.  Neurological: Negative for dizziness, syncope, weakness, light-headedness, numbness and headaches.  Psychiatric/Behavioral: Negative for confusion.  All other systems reviewed and are negative.    Allergies  Aspirin  Home Medications   Current Outpatient Rx  Name  Route  Sig  Dispense  Refill  . ALBUTEROL SULFATE HFA 108 (90 BASE) MCG/ACT IN AERS   Inhalation   Inhale 2 puffs into the lungs every 4 (four) hours as needed. Wheezing         . ALPRAZOLAM 0.5 MG PO TABS   Oral   Take 1 tablet (0.5 mg total) by mouth 3 (three) times daily as needed for sleep or anxiety.   90 tablet   0   . ASPIRIN 81 MG PO CHEW   Oral   Chew 81 mg by mouth daily before breakfast.          .  BC HEADACHE POWDER PO   Oral   Take 1 packet by mouth every 8 (eight) hours as needed. For headache         . CARVEDILOL 25 MG PO TABS   Oral   Take 1 tablet (25 mg total) by mouth 2 (two) times daily. Take 1 pill in the morning, and 1 in the evening   60 tablet   5   . CLINDAMYCIN HCL 150 MG PO CAPS   Oral   Take 150 mg by mouth every 6 (six) hours.         . CYCLOBENZAPRINE HCL 5 MG PO TABS   Oral   Take 5 mg by mouth 3 (three) times daily as needed. Muscle spasm         . FAMOTIDINE 20 MG PO TABS   Oral   Take 20 mg by mouth 2 (two) times daily.         . FUROSEMIDE 20 MG PO TABS   Oral   Take 20 mg by mouth at bedtime.         Marland Kitchen GABAPENTIN 100 MG PO CAPS   Oral   Take 100 mg by mouth daily as needed. For nerve pain         .  HYDROCODONE-ACETAMINOPHEN 5-325 MG PO TABS   Oral   Take 1 tablet by mouth every 6 (six) hours as needed. For pain         . HYDROXYZINE HCL 25 MG PO TABS   Oral   Take 25 mg by mouth 2 (two) times daily as needed. For itching         . LISINOPRIL-HYDROCHLOROTHIAZIDE 20-25 MG PO TABS   Oral   Take 1 tablet by mouth daily before breakfast.         . MECLIZINE HCL 25 MG PO TABS   Oral   Take 1 tablet (25 mg total) by mouth 2 (two) times daily.   30 tablet   3   . POTASSIUM CHLORIDE ER 10 MEQ PO TBCR   Oral   Take 20 mEq by mouth daily as needed. For low K         . SERTRALINE HCL 100 MG PO TABS   Oral   Take 100 mg by mouth at bedtime.         Marland Kitchen ZOLPIDEM TARTRATE 10 MG PO TABS   Oral   Take 1 tablet (10 mg total) by mouth at bedtime as needed for sleep.   30 tablet   0     To be filled on 11/10/2011     BP 162/82  Pulse 80  Temp 98.2 F (36.8 C) (Oral)  Resp 18  Ht 5\' 5"  (1.651 m)  Wt 215 lb (97.523 kg)  BMI 35.78 kg/m2  SpO2 100%  Physical Exam  Nursing note and vitals reviewed. Constitutional: She is oriented to person, place, and time. She appears well-developed and well-nourished. No distress.  HENT:  Head: Normocephalic and atraumatic.  Mouth/Throat: Oropharynx is clear and moist and mucous membranes are normal.       No sign of airway obstruction. No edema of face, eyelids, lips, tongue, uvula.Marland Kitchen Uvula midline, no nasal congestion or drooling.  Tongue not elevated. No trismus.  Eyes: Conjunctivae normal and EOM are normal.  Neck: Trachea normal, normal range of motion and full passive range of motion without pain. Neck supple. Carotid bruit is not present. No tracheal deviation present.  No carotid bruits or stridor  Cardiovascular: Regular rhythm, intact distal pulses and normal pulses.   Pulmonary/Chest: Effort normal. No stridor.  Musculoskeletal: Normal range of motion.  Neurological: She is alert and oriented to person, place, and time.   Skin: Skin is warm, dry and intact. Rash noted. She is not diaphoretic. There is erythema.       Papular rash with significant excoriations located on torso, back, upper & lower extremities, & around mouth. LLE venous stasis w superimposing cellulitis  Psychiatric: She has a normal mood and affect. Her behavior is normal.    ED Course  Procedures (including critical care time)  Labs Reviewed  BASIC METABOLIC PANEL - Abnormal; Notable for the following:    Potassium 3.4 (*)     Glucose, Bld 135 (*)     All other components within normal limits  CBC WITH DIFFERENTIAL   No results found.   No diagnosis found.  BP 162/82  Pulse 80  Temp 98.2 F (36.8 C) (Oral)  Resp 18  Ht 5\' 5"  (1.651 m)  Wt 215 lb (97.523 kg)  BMI 35.78 kg/m2  SpO2 100%   MDM  Hypertension, Dermatitis, cellulitis, PVD  Pt to ER c/o rash and worsening PVD of LLE complicated by superimposing cellulitis. She is currently being treated w clindamycin and has follow up scheduled with the wound clinic.Will give work note w restrictions not to stand. Conservative home therapies discussed. In addition pt has a dermatitis covering significant surface area. Treated in ER with IV steroids & benadryl. No airway compromise on exam. Will dc w pain medication and steroid burst. Advised Dermatology follow up. Pt stable for dc and agreeable with plan.         Jaci Carrel, [place] 11/18/11 2102

## 2011-11-19 NOTE — ED Provider Notes (Signed)
Medical screening examination/treatment/procedure(s) were performed by non-physician practitioner and as supervising physician I was immediately available for consultation/collaboration.   Dione Booze, MD 11/19/11 0001

## 2011-11-20 NOTE — ED Provider Notes (Signed)
Medical screening examination/treatment/procedure(s) were conducted as a shared visit with non-physician practitioner(s) and myself.  I personally evaluated the patient during the encounter  Pt with diffuse rash which looks allergic or contact in nature.  No airway involvement and no worsening of cellulitis in the LLE  Gwyneth Sprout, MD 11/20/11 1911

## 2011-11-21 ENCOUNTER — Ambulatory Visit (INDEPENDENT_AMBULATORY_CARE_PROVIDER_SITE_OTHER): Payer: Self-pay | Admitting: Family Medicine

## 2011-11-21 ENCOUNTER — Encounter: Payer: Self-pay | Admitting: Family Medicine

## 2011-11-21 VITALS — BP 183/79 | HR 94 | Temp 98.1°F | Ht 65.0 in | Wt 214.8 lb

## 2011-11-21 DIAGNOSIS — R7401 Elevation of levels of liver transaminase levels: Secondary | ICD-10-CM

## 2011-11-21 DIAGNOSIS — L0390 Cellulitis, unspecified: Secondary | ICD-10-CM

## 2011-11-21 DIAGNOSIS — L281 Prurigo nodularis: Secondary | ICD-10-CM

## 2011-11-21 DIAGNOSIS — R748 Abnormal levels of other serum enzymes: Secondary | ICD-10-CM

## 2011-11-21 DIAGNOSIS — L0291 Cutaneous abscess, unspecified: Secondary | ICD-10-CM

## 2011-11-21 DIAGNOSIS — L280 Lichen simplex chronicus: Secondary | ICD-10-CM

## 2011-11-21 DIAGNOSIS — R21 Rash and other nonspecific skin eruption: Secondary | ICD-10-CM

## 2011-11-21 MED ORDER — ALPRAZOLAM 1 MG PO TABS: 1.0000 mg | ORAL_TABLET | Freq: Two times a day (BID) | ORAL | Status: DC | PRN

## 2011-11-21 MED ORDER — DOXEPIN HCL 25 MG PO CAPS: 25.0000 mg | ORAL_CAPSULE | Freq: Every day | ORAL | Status: DC

## 2011-11-21 MED ORDER — HYDROXYZINE HCL 25 MG PO TABS: 25.0000 mg | ORAL_TABLET | Freq: Four times a day (QID) | ORAL | Status: DC | PRN

## 2011-11-21 MED ORDER — ALPRAZOLAM 1 MG PO TABS: 1.0000 mg | ORAL_TABLET | Freq: Three times a day (TID) | ORAL | Status: DC | PRN

## 2011-11-21 NOTE — Assessment & Plan Note (Signed)
Cellulitis improving status post Clindamycin.

## 2011-11-21 NOTE — Patient Instructions (Addendum)
Please call [place] with the information for Citizen's Disability so we can fax them a letter. Start taking Xanax 1 mg every 8 hours scheduled for anxiety/itching. Also take Doxepin at bedtime. Return in one week for follow up and to re-dress your leg.

## 2011-11-21 NOTE — Progress Notes (Signed)
  Subjective:    Patient ID: Stacy Campbell, female    DOB: [birth year], 56 y.o.   MRN: [id number]  HPI Pt comes in today for SDA for left leg cellulitis and worsening systemic rash.  Patient with long history of LLE rash/venous insufficiency/cellulitis and Hepatitis C.   Was seen in ED 11/10 for the same and given percocet for pain control and prednisone burst for itching.  Patient says hydrocodone causes worsening itching and prednisone has not helped.  Patient has a hx of picking at skin which used to be well controlled with Xanax and Zoloft, but this does not seem to be helping anymore.  She also takes Atarax which provides some relief.  Patient brings in leave of absence form for work and needs an excuse to stay off her feet until 01/21/12.  Patient still has not heard anything from [facility] hepatitis clinic or wound care center referrals.  Had unna boot placed about 2 weeks ago which was removed in ED 2 days ago.  No fevers/ chills.  No N/V/D or other systemic symptoms.      Review of Systems Per HPI     Objective:   Physical Exam Gen: tearful, frustrated with skin disorder Ext: L leg witih significant erythema, venous statsis changes, and post inflammatory changes of distal 2/3 lower extremity; + exfoliation/breaks in scabbing, but no active bleeding or pus drainage Skin: as above + pruritic, erythematous rash on chest, abdomen, back, and bilateral upper extremities MSK: strong pedal pulse of LLE     Assessment & Plan:

## 2011-11-21 NOTE — Assessment & Plan Note (Signed)
Patient continues to pick and scratch skin and continues to present with superimposed infection. - Will increase Xanax to 1 mg TID  - Add Doxepin 25 mg QHS - Dressed LE wound with Vaseline gauze and ACE wrap - Change dressing at one week follow up appointment with me - Will refer to [facility] Dermatology or if patient approved for Medicaid, hopefully can be seen by local Derm

## 2011-11-21 NOTE — Assessment & Plan Note (Signed)
See prurigo nodularis A/P.

## 2011-11-28 ENCOUNTER — Ambulatory Visit (INDEPENDENT_AMBULATORY_CARE_PROVIDER_SITE_OTHER): Payer: Self-pay | Admitting: Family Medicine

## 2011-11-28 ENCOUNTER — Encounter: Payer: Self-pay | Admitting: Family Medicine

## 2011-11-28 VITALS — BP 158/95 | HR 98 | Temp 97.9°F | Wt 216.0 lb

## 2011-11-28 DIAGNOSIS — M79605 Pain in left leg: Secondary | ICD-10-CM

## 2011-11-28 DIAGNOSIS — M79609 Pain in unspecified limb: Secondary | ICD-10-CM

## 2011-11-28 MED ORDER — OXYCODONE HCL 5 MG PO CAPS: 5.0000 mg | ORAL_CAPSULE | Freq: Three times a day (TID) | ORAL | Status: DC | PRN

## 2011-11-28 NOTE — Progress Notes (Signed)
  Subjective:    Patient ID: Stacy Campbell, female    DOB: 1955/08/28, 56 y.o.   MRN: [id number]  HPI  Pt comes to clinic for follow up left leg wound and prurigo nodularis.    LT leg wound:  At last visit, we wrapped LT leg with Vaseline gauze which seemed to help.  Today, leg wound is not dry or peeling.  It does not appear infected and patient currently afebrile.  Patient says it is not itchy as long as it remains wrapped, but it is painful.  She was given Percocet from the ED and says that helps relieve throbbing leg pain.  She has a hx of Hepatitis C, so I advised patient to be careful not to take too much Tylenol.  Wound care referral in process.  Patient has an expired orange card - I have had difficulty referring patient to both Wound and Derm due to lack of insurance.  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  Patient currently takes Atarax and Ativan during the day and Doxepin at bedtime.  Patient says Doxepin has helped relieve itching at night.  Today, patient's palmar surfaces of hands are dry and peeling.  She says she has been washing hands frequently and does not use hand lotion.    No fevers/ chills.  No N/V/D or other systemic symptoms.       Review of Systems Per HPI    Objective:   Physical Exam Gen: in no acute distress Ext: L leg is dark purple in color consistent with venous stasis changes;  post inflammatory changes of distal 2/3 lower extremity; no open wounds or ulcerations, no active bleeding or drainage Skin: healing, small round nodular lesions on arms and chest MSK: + pedal pulse of LLE     Assessment & Plan:

## 2011-11-28 NOTE — Patient Instructions (Signed)
1. Please call Britta Mccreedy to reschedule appointment for [facility]  Once you get an orange card, we may be able to refer you to dermatologist in [place]. 2. Call the Hepatitis Clinic to schedule an appointment here or in [facility] as soon as possible. 3. For leg wound, keep petroleum jelly guaze and bandage on for 2 more weeks, then return to clinic for removal. 4. For pain, take Oxycodone every 8 hours as needed only for severe pain. Continue Doxepin for itching at bedtime. 5. Schedule follow up appointment with me in 2 weeks or sooner if needed. 6. If you develop fever (temp > 101), chills, worsening pain or redness of skin, please call your doctor.

## 2011-11-28 NOTE — Assessment & Plan Note (Signed)
Secondary to chronic LT venous insufficiency.  Wound does not appear infected at this time, but is painful. - Will treat pain with Oxycodone 5 mg Q 8 hr PRN x 2 weeks - Wound care referral in process - Will refer to Dermatology once patient re-applies for orange card - Wrapped leg in Vaseline gauze and ACE bandage x 2 weeks  - Recheck wound in 2 weeks or sooner as needed - Red flags and signs of infection reviewed

## 2011-12-10 ENCOUNTER — Encounter (HOSPITAL_BASED_OUTPATIENT_CLINIC_OR_DEPARTMENT_OTHER): Payer: No Typology Code available for payment source | Attending: General Surgery

## 2011-12-10 DIAGNOSIS — L97809 Non-pressure chronic ulcer of other part of unspecified lower leg with unspecified severity: Secondary | ICD-10-CM | POA: Insufficient documentation

## 2011-12-10 DIAGNOSIS — I872 Venous insufficiency (chronic) (peripheral): Secondary | ICD-10-CM | POA: Insufficient documentation

## 2011-12-10 DIAGNOSIS — L03119 Cellulitis of unspecified part of limb: Secondary | ICD-10-CM | POA: Insufficient documentation

## 2011-12-10 DIAGNOSIS — L02419 Cutaneous abscess of limb, unspecified: Secondary | ICD-10-CM | POA: Insufficient documentation

## 2011-12-10 DIAGNOSIS — I87309 Chronic venous hypertension (idiopathic) without complications of unspecified lower extremity: Secondary | ICD-10-CM | POA: Insufficient documentation

## 2011-12-12 ENCOUNTER — Ambulatory Visit (INDEPENDENT_AMBULATORY_CARE_PROVIDER_SITE_OTHER): Payer: No Typology Code available for payment source | Admitting: Family Medicine

## 2011-12-12 ENCOUNTER — Encounter: Payer: Self-pay | Admitting: Family Medicine

## 2011-12-12 VITALS — BP 156/106 | HR 105 | Temp 97.7°F | Ht 65.0 in | Wt 218.3 lb

## 2011-12-12 DIAGNOSIS — B1920 Unspecified viral hepatitis C without hepatic coma: Secondary | ICD-10-CM

## 2011-12-12 DIAGNOSIS — M79609 Pain in unspecified limb: Secondary | ICD-10-CM

## 2011-12-12 DIAGNOSIS — M79605 Pain in left leg: Secondary | ICD-10-CM

## 2011-12-12 MED ORDER — ZOLPIDEM TARTRATE 10 MG PO TABS: 10.0000 mg | ORAL_TABLET | Freq: Every evening | ORAL | Status: DC | PRN

## 2011-12-12 MED ORDER — HYDROXYZINE HCL 25 MG PO TABS: 25.0000 mg | ORAL_TABLET | Freq: Four times a day (QID) | ORAL | Status: DC | PRN

## 2011-12-12 MED ORDER — ALPRAZOLAM 1 MG PO TABS: 1.0000 mg | ORAL_TABLET | Freq: Three times a day (TID) | ORAL | Status: DC | PRN

## 2011-12-12 MED ORDER — HYDROCODONE-ACETAMINOPHEN 5-325 MG PO TABS: 1.0000 | ORAL_TABLET | Freq: Two times a day (BID) | ORAL | Status: DC | PRN

## 2011-12-12 NOTE — Progress Notes (Signed)
  Subjective:    Patient ID: Stacy Campbell, female    DOB: 1955-11-18, 56 y.o.   MRN: [id number]  HPI  Hepatitis C: Patient has been seen by Hepatitis Clinic one time and has had difficulty with follow up due to lack of insurance.  She now has the orange card and was told she may be able to go to the Hepatitis Clinic in [facility].  Patient needs to complete forms for financial assistance before she can schedule an appointment.  According to patient, she has been diagnosed with Hep C Stage 3 and Dr. Scot Jun plans on starting treatment when patient can pay for it.  Left leg wound: Patient was seen at Wound care center 2 days ago, unna boot replaced at that time.  Diagnosed with cellulitis and given Rx for Doxy.  Due for unna boot change on 12/9.  Patient complains of pain - constant.  Throbbing sensation.  Percocet helps relieve pain, but she also develops itching.  Patient takes Atarax which helps decrease this adverse effect.  Anxiety/Prurigo nodularis: Patient's anxiety worsens generalized itching.  Seems to be well controlled on Ativan 1 mg TID and Atarax PRN.  She also takes Ambien 10 mg at bedtime which I have tried to discontinue, but patient says it is the only treatment that works.  Patient asking for monthly refill of Ativan and Ambien today.  Denies any nightmares or adverse effects of Ambien.  Review of Systems  Per HPI    Objective:   Physical Exam  Constitutional: She appears well-nourished. No distress.  Musculoskeletal: Normal range of motion. She exhibits tenderness. She exhibits no edema.  Skin:        Left lower extremity: wrapped in unna boot (placed at wound care 2 days ago) General: resolving papular rash on arms and chest; no active or open wounds/lesions      Assessment & Plan:

## 2011-12-12 NOTE — Assessment & Plan Note (Signed)
Hepatitis C Stage 3.  Patient currently working on establishing care at the [facility].  She will complete financial assistance forms this week and hopefully schedule an appointment soon.  Patient to call me when appointment is made.

## 2011-12-12 NOTE — Assessment & Plan Note (Addendum)
Patient was seen at [facility], diagnosed with cellulitis and given Rx Doxycycline and unna boot applied.  Will request medical records.  Patient to follow up with wound care next week.  I appreciate their recommendations and co-management of chronic leg wound.  For pain, continue Percocet BID PRN.  For itching, Atarax PRN and Ativan TID PRN for prurigo nodularis.

## 2011-12-12 NOTE — Patient Instructions (Addendum)
Thanks for coming to see me today.  I am happy you were able to meet the Wound Care team. Please let me know when you have an appointment scheduled in [place]. We will put you on the Dermatology waiting list - could be January or February when they have an opening. For sleep, continue Ambien at bedtime but call me if you experience any side effects or if medication does not work as well. For itching, I have sent Hydroxyzine to your pharmacy.  Continue Xanax as directed. Schedule follow up appointment with me in ONE month or sooner as needed.

## 2011-12-18 ENCOUNTER — Ambulatory Visit: Payer: No Typology Code available for payment source | Admitting: Family Medicine

## 2011-12-19 ENCOUNTER — Telehealth: Payer: Self-pay | Admitting: Family Medicine

## 2011-12-19 NOTE — Telephone Encounter (Signed)
Spoke with patient and informed her that referral will be faxed in to the [facility] the first of January and they will contact her with the appointment

## 2011-12-19 NOTE — Telephone Encounter (Signed)
States that Dr Terri Piedra does take the orange card and needs for [place] to send the referral - she has the orange card and they accept that.

## 2011-12-27 ENCOUNTER — Ambulatory Visit (INDEPENDENT_AMBULATORY_CARE_PROVIDER_SITE_OTHER): Payer: No Typology Code available for payment source | Admitting: Family Medicine

## 2011-12-27 ENCOUNTER — Encounter: Payer: Self-pay | Admitting: Family Medicine

## 2011-12-27 VITALS — BP 172/76 | HR 78 | Temp 99.6°F | Wt 207.0 lb

## 2011-12-27 DIAGNOSIS — M79609 Pain in unspecified limb: Secondary | ICD-10-CM

## 2011-12-27 DIAGNOSIS — M79605 Pain in left leg: Secondary | ICD-10-CM

## 2011-12-27 NOTE — Assessment & Plan Note (Addendum)
Left leg pain persists, however leg is less red and swollen today.  No open wounds or ulcers today.  Patient was seen at Wound care x 3 and discharged.  She was told she will need to see a dermatologist from now on.   LE venous and arterial dopplers performed and within normal limits. - Referral for dermatology was placed in November, she is on the list for [facility] who will start taking patients 01/09/12 - Will wrap LT leg today with vaseline gauze x 2 weeks - For pain, patient should have Percocet from earlier this month - For itching, continue Ativan, Atarax, and Benadryl as directed - Will also write letter for patient to speed up Medicaid process - RTC in 2 weeks for wound recheck

## 2011-12-27 NOTE — Progress Notes (Signed)
  Subjective:    Patient ID: Stacy Campbell, female    DOB: [birth year], 56 y.o.   MRN: [id number]  HPI  LT leg wound: Patient returns to clinic for follow LT leg wound.  Patient was seen at Wound care center three times and recently discharged.  She was dx with cellulitis and given Rx for Doxy and wrapped leg with unna boot.  Wound care recommends that patient be followed by Derm from now on.    Patient complains of pain - constant.  Throbbing, aching, and burning sensation.  Located at ankle and radiates to posterior calf.  She has had both LE venous dopplers and bilateral LE arterial dopplers performed as part of work up and both were normal.  Percocet helps relieve pain, but she also develops itching, so she takes benadryl prior to Percocet which helps.  She also takes Atarax and Ativan scheduled for prurigo nodularis.    Review of Systems Per HPI    Objective:   Physical Exam  Constitutional: She appears well-nourished. No distress.  Musculoskeletal:       LT lower extremity: no pitting edema; erythematous from foot to below knee; hemosiderin deposits present; no open wounds or ulcers appreciated  Skin:       LT leg: 2+ strong distal pulse       Assessment & Plan:

## 2012-01-06 ENCOUNTER — Other Ambulatory Visit: Payer: Self-pay | Admitting: Family Medicine

## 2012-01-11 ENCOUNTER — Ambulatory Visit (INDEPENDENT_AMBULATORY_CARE_PROVIDER_SITE_OTHER): Payer: No Typology Code available for payment source | Admitting: Family Medicine

## 2012-01-11 ENCOUNTER — Encounter: Payer: Self-pay | Admitting: Family Medicine

## 2012-01-11 VITALS — BP 136/69 | HR 88 | Temp 98.9°F | Wt 203.6 lb

## 2012-01-11 DIAGNOSIS — M79609 Pain in unspecified limb: Secondary | ICD-10-CM

## 2012-01-11 DIAGNOSIS — L0291 Cutaneous abscess, unspecified: Secondary | ICD-10-CM

## 2012-01-11 DIAGNOSIS — M79605 Pain in left leg: Secondary | ICD-10-CM

## 2012-01-11 DIAGNOSIS — L0390 Cellulitis, unspecified: Secondary | ICD-10-CM

## 2012-01-11 MED ORDER — ZOLPIDEM TARTRATE 10 MG PO TABS: 10.0000 mg | ORAL_TABLET | Freq: Every evening | ORAL | Status: DC | PRN

## 2012-01-11 MED ORDER — OXYCODONE-ACETAMINOPHEN 5-325 MG PO TABS: 1.0000 | ORAL_TABLET | Freq: Four times a day (QID) | ORAL | Status: DC | PRN

## 2012-01-11 MED ORDER — DOXYCYCLINE HYCLATE 100 MG PO TABS: 100.0000 mg | ORAL_TABLET | Freq: Two times a day (BID) | ORAL | Status: AC

## 2012-01-11 MED ORDER — TRIAMCINOLONE ACETONIDE 0.1 % EX CREA: TOPICAL_CREAM | Freq: Two times a day (BID) | CUTANEOUS | Status: DC

## 2012-01-11 NOTE — Assessment & Plan Note (Addendum)
Left leg less swollen but still red and some areas of hyperpigmented.  Wound care has discharged patient and recommends Dermatology.  Recent LE venous and arterial dopplers performed and within normal limits.  She is not a diabetic. - Awaiting Dermatology referral via [facility]  - Precepted with Dr. Gwendolyn Grant who recommends another course of Doxycycline and is concerned about allergic reaction  - Will give Rx for Triamcinolone cream to apply to hive-like rash - Will wrap LT leg today with vaseline gauze x 2 weeks - Percocet PRN x 1 month - For itching, continue Ativan, Atarax, and Benadryl as directed - RTC in 4 weeks for wound recheck

## 2012-01-11 NOTE — Progress Notes (Signed)
  Subjective:    Patient ID: Stacy Campbell, female    DOB: 19-Oct-1955, 57 y.o.   MRN: [id number]  HPI  LT leg wound: Patient returns to clinic for follow LT leg wound.  Patient still complains of throbbing/burning pain.  Located LT ankle and radiates to anterior shin and posterior calf.  Pain is worse with ambulation, better at rest and with elevation.  For pain, she was taking Norco but she complains of itching and prefers Percocet instead.  She continues to take Atarax for itching and Ativan for prurigo nodularis.  Patient was seen at Wound care center three times and discharged in December.  Patient is on Dermatology waiting list.  Of note, she has had both LE venous dopplers and bilateral LE arterial dopplers performed as part of work up and both were normal.  No hx of diabetes.   Denies any fever, chills, NS, nausea/vomiting.  Review of Systems  Per HPI    Objective:   Physical Exam Constitutional: She appears well-nourished. No distress.  Musculoskeletal: LT lower extremity: no pitting edema or swelling; erythematous below knee, urticarial appearance; lower leg skin is hyperpigmented and dry; no open wounds or ulcers appreciated  Skin: strong pedal pulse on LT; skin normal on RT with healing wounds from prurigo nodularis     Assessment & Plan:

## 2012-01-11 NOTE — Patient Instructions (Addendum)
Please continue to elevate your leg while resting. Keep it wrapped up for 2 weeks, then schedule a nursing visit to have it re-wrapped. Schedule a follow up visit with Dr. Tye Savoy in 4 weeks. You are on the Dermatology waiting list - we will call you with time/date of appointment. If leg pain worsens, becomes more swollen, or you develop open wounds, please return to clinic sooner.

## 2012-01-14 ENCOUNTER — Encounter (HOSPITAL_BASED_OUTPATIENT_CLINIC_OR_DEPARTMENT_OTHER): Payer: No Typology Code available for payment source

## 2012-01-25 ENCOUNTER — Other Ambulatory Visit: Payer: Self-pay | Admitting: Family Medicine

## 2012-01-25 ENCOUNTER — Ambulatory Visit (INDEPENDENT_AMBULATORY_CARE_PROVIDER_SITE_OTHER): Payer: No Typology Code available for payment source | Admitting: *Deleted

## 2012-01-25 DIAGNOSIS — L280 Lichen simplex chronicus: Secondary | ICD-10-CM

## 2012-01-25 DIAGNOSIS — R7401 Elevation of levels of liver transaminase levels: Secondary | ICD-10-CM

## 2012-01-25 DIAGNOSIS — L0291 Cutaneous abscess, unspecified: Secondary | ICD-10-CM

## 2012-01-25 DIAGNOSIS — B1920 Unspecified viral hepatitis C without hepatic coma: Secondary | ICD-10-CM

## 2012-01-25 DIAGNOSIS — R21 Rash and other nonspecific skin eruption: Secondary | ICD-10-CM

## 2012-01-25 DIAGNOSIS — I8310 Varicose veins of unspecified lower extremity with inflammation: Secondary | ICD-10-CM

## 2012-01-25 DIAGNOSIS — I872 Venous insufficiency (chronic) (peripheral): Secondary | ICD-10-CM

## 2012-01-25 DIAGNOSIS — M79609 Pain in unspecified limb: Secondary | ICD-10-CM

## 2012-01-25 NOTE — Progress Notes (Signed)
Patient in for left leg recheck as directed by Dr. Tye Savoy. No edema noted. Leg is red and appears irritated just below knee where she states it itches . Pinpoint open area here and there over leg but no more than 3-4. Dr. Deirdre Priest came in too look at leg and advised to apply vaseline twice daily to leg and use a long white sock over leg. Patient wants  vaseline applied in office and  wrapped.   Vaseline applied and wrapped with kerlex. Ace bandage that she brought from home  reapplied loosely.Marland Kitchen Has follow appointment with Dr. Tye Savoy on 01/30. Advised to keep leg elevated.

## 2012-02-07 ENCOUNTER — Encounter: Payer: Self-pay | Admitting: Family Medicine

## 2012-02-07 ENCOUNTER — Ambulatory Visit (INDEPENDENT_AMBULATORY_CARE_PROVIDER_SITE_OTHER): Payer: No Typology Code available for payment source | Admitting: Family Medicine

## 2012-02-07 VITALS — BP 161/87 | HR 90 | Temp 98.3°F | Ht 65.0 in | Wt 200.8 lb

## 2012-02-07 DIAGNOSIS — L281 Prurigo nodularis: Secondary | ICD-10-CM

## 2012-02-07 DIAGNOSIS — M79605 Pain in left leg: Secondary | ICD-10-CM

## 2012-02-07 DIAGNOSIS — L280 Lichen simplex chronicus: Secondary | ICD-10-CM

## 2012-02-07 DIAGNOSIS — M79609 Pain in unspecified limb: Secondary | ICD-10-CM

## 2012-02-07 DIAGNOSIS — B1920 Unspecified viral hepatitis C without hepatic coma: Secondary | ICD-10-CM

## 2012-02-07 MED ORDER — ALPRAZOLAM 1 MG PO TABS: 1.0000 mg | ORAL_TABLET | Freq: Two times a day (BID) | ORAL | Status: DC | PRN

## 2012-02-07 MED ORDER — ZOLPIDEM TARTRATE 10 MG PO TABS: 10.0000 mg | ORAL_TABLET | Freq: Every evening | ORAL | Status: DC | PRN

## 2012-02-07 MED ORDER — MECLIZINE HCL 25 MG PO TABS: 25.0000 mg | ORAL_TABLET | Freq: Every day | ORAL | Status: DC | PRN

## 2012-02-07 NOTE — Assessment & Plan Note (Addendum)
Will refill Xanax BID PRN for anxiety/itching.  Patient has follow up appointment with Derm in 2 weeks.  Will request medical records.  Follow up with me in 1-2 months or sooner as needed.

## 2012-02-07 NOTE — Assessment & Plan Note (Signed)
Patient currently waiting to hear back from Hep C Clinic in [facility].

## 2012-02-07 NOTE — Patient Instructions (Addendum)
It was good to see you today. Schedule follow up with me in early March or sooner as needed. Please pick up Rx at your pharmacy.

## 2012-02-07 NOTE — Progress Notes (Signed)
  Subjective:    Patient ID: Stacy Campbell, female    DOB: Jul 20, 1955, 57 y.o.   MRN: [id number]  HPI  Dermatitis: She was seen at Dermatology and diagnosed with "dermatitis".  She has follow up an appointment with Derm in 2 weeks.  Dermatologist told her that leg is in "healing process" and Rx given for tub of Triamcinolone cream.  Pain is improving.  She was supposed to return to work a few weeks ago, but Derm advised patient to wait until leg is better to return to work.  She has a temporary disability form for me to fill out.  Per patient she seems happy with how leg/skin looks and we are both glad she is seeing Dermatology.  Medication refills: Needs refills on Xanax and Ambien.  She takes Xanax for prurigo nodularis to prevent from anxiety that causes itching.  She takes Ambien only as needed - about 2 to 3 times per week.  She also would like refill for Meclizine for hx vertigo.  She has used it in the past for when she feels like the room is spinning and starts walking sideways.    Review of Systems  Per HPI    Objective:   Physical Exam  Constitutional: She appears well-nourished. No distress.  Musculoskeletal: Normal range of motion. She exhibits tenderness. She exhibits no edema.  Skin:       LT lower leg: hyperpigmented, but no signs of infection or open wounds/lesions at this time; no edema; erythema markedly improved   Psychiatric: She has a normal mood and affect.      Assessment & Plan:

## 2012-02-07 NOTE — Assessment & Plan Note (Signed)
Seems to be improving now that dermatitis is healing.  Appreciate Dermatology co-management.  Plan to continue steroid cream and daily wrapping with ACE bandage.

## 2012-03-07 ENCOUNTER — Ambulatory Visit (INDEPENDENT_AMBULATORY_CARE_PROVIDER_SITE_OTHER): Payer: No Typology Code available for payment source | Admitting: Family Medicine

## 2012-03-07 ENCOUNTER — Encounter: Payer: Self-pay | Admitting: Family Medicine

## 2012-03-07 VITALS — BP 140/73 | HR 85 | Temp 98.3°F | Ht 65.0 in | Wt 206.7 lb

## 2012-03-07 DIAGNOSIS — L280 Lichen simplex chronicus: Secondary | ICD-10-CM

## 2012-03-07 DIAGNOSIS — M79605 Pain in left leg: Secondary | ICD-10-CM

## 2012-03-07 DIAGNOSIS — L281 Prurigo nodularis: Secondary | ICD-10-CM

## 2012-03-07 DIAGNOSIS — M79609 Pain in unspecified limb: Secondary | ICD-10-CM

## 2012-03-07 MED ORDER — GABAPENTIN 300 MG PO CAPS: 300.0000 mg | ORAL_CAPSULE | Freq: Three times a day (TID) | ORAL | Status: DC

## 2012-03-07 MED ORDER — ZOLPIDEM TARTRATE 10 MG PO TABS: 10.0000 mg | ORAL_TABLET | Freq: Every evening | ORAL | Status: DC | PRN

## 2012-03-07 NOTE — Progress Notes (Signed)
  Subjective:    Patient ID: Stacy Campbell, female    DOB: Mar 29, 1955, 56 y.o.   MRN: [id number]  HPI  Patient presents to clinic for leg pain and follow up skin rash..  She has seen a Dermatologist who diagnosed this as severe dermatitis/eczema.  Next appointment in mid-March.  Patient has been using steroid cream and completed course of antibiotics.  Per patient, leg wound has improved.  She says it is not itching as much anymore, but still complains of pain located ankle and radiates up lateral LE.  She describes pain as burning, tingling sensation.  Pain is constant and worsens with movement.  Medication refills:  She is requesting refill for Ambien.  She takes Atarax PRN for itching.  I will not continue prescribing Hydrocodone at this time due to possible allergic reaction - rash.  I will prescribe Gabapentin.  Cautioned patient to not combine these medications due to drowsiness.   Review of Systems Per HPI    Objective:   Physical Exam General: in no acute distress MSK: strong distal pedal pulsis Skin: rash markedly iImproved; post-inflammatory hyperpigmentation; no evidence of cellulitis, no excoriations or open wounds     Assessment & Plan:

## 2012-03-10 NOTE — Assessment & Plan Note (Signed)
For pain, will treat with Gabapentin 300 TID.  No Hydrocodone at this time due to possible allergy - rash.  Follow up as needed if pain worsens.

## 2012-03-10 NOTE — Assessment & Plan Note (Signed)
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  Followed by Dermatology.  Continue current regimen.

## 2012-03-13 ENCOUNTER — Telehealth: Payer: Self-pay | Admitting: Family Medicine

## 2012-03-13 MED ORDER — PREGABALIN 50 MG PO CAPS: 50.0000 mg | ORAL_CAPSULE | Freq: Three times a day (TID) | ORAL | Status: DC

## 2012-03-13 MED ORDER — MELOXICAM 15 MG PO TABS: 15.0000 mg | ORAL_TABLET | Freq: Every day | ORAL | Status: DC

## 2012-03-13 NOTE — Telephone Encounter (Signed)
Patient states that the gabapentin has made her nauseous for the past 3 days. Would like [employer] to possibly prescribe something else other than gabapentin. Pls call.

## 2012-03-13 NOTE — Telephone Encounter (Signed)
Will send Meloxicam to pharmacy one month supply.  Please inform patient.  Thank you.

## 2012-03-13 NOTE — Telephone Encounter (Signed)
Pt was informed. Proposito, Giovanna S  

## 2012-03-19 ENCOUNTER — Other Ambulatory Visit: Payer: Self-pay | Admitting: Family Medicine

## 2012-03-19 MED ORDER — ALPRAZOLAM 1 MG PO TABS: 1.0000 mg | ORAL_TABLET | Freq: Two times a day (BID) | ORAL | Status: DC | PRN

## 2012-03-19 NOTE — Telephone Encounter (Signed)
Patient is calling because she is completely out of her Xanax and her pharmacy was to fax the request over.  Dr. Tye Savoy box is empty so she may have it.  She is asking that it get refilled today please.

## 2012-03-20 NOTE — Telephone Encounter (Signed)
Dr Tye Savoy please let me know where rx is placed. Proposito, [name]

## 2012-03-23 NOTE — Telephone Encounter (Signed)
I faxed it to her pharmacy on Wednesday evening 03/19/12

## 2012-03-24 NOTE — Telephone Encounter (Signed)
Left message on patients voicemail that Dr Tye Savoy faxed rx to her pharmacy on Wednesday 03/19/2012. [name], [name]

## 2012-04-04 ENCOUNTER — Ambulatory Visit (INDEPENDENT_AMBULATORY_CARE_PROVIDER_SITE_OTHER): Payer: No Typology Code available for payment source | Admitting: Family Medicine

## 2012-04-04 ENCOUNTER — Encounter: Payer: Self-pay | Admitting: Family Medicine

## 2012-04-04 VITALS — BP 119/76 | HR 73 | Temp 98.0°F | Ht 65.0 in | Wt 209.4 lb

## 2012-04-04 DIAGNOSIS — M25579 Pain in unspecified ankle and joints of unspecified foot: Secondary | ICD-10-CM

## 2012-04-04 DIAGNOSIS — L280 Lichen simplex chronicus: Secondary | ICD-10-CM

## 2012-04-04 DIAGNOSIS — L281 Prurigo nodularis: Secondary | ICD-10-CM

## 2012-04-04 DIAGNOSIS — M25572 Pain in left ankle and joints of left foot: Secondary | ICD-10-CM

## 2012-04-04 DIAGNOSIS — G4700 Insomnia, unspecified: Secondary | ICD-10-CM

## 2012-04-04 DIAGNOSIS — M79609 Pain in unspecified limb: Secondary | ICD-10-CM

## 2012-04-04 DIAGNOSIS — M79605 Pain in left leg: Secondary | ICD-10-CM

## 2012-04-04 MED ORDER — ZOLPIDEM TARTRATE 10 MG PO TABS: 10.0000 mg | ORAL_TABLET | Freq: Every evening | ORAL | Status: DC | PRN

## 2012-04-04 NOTE — Assessment & Plan Note (Addendum)
Left leg pain may be related to lack of activity/sedentary lifestyle since she has been dealing with chronic leg dermatitis.  I think she would benefit from PT.  If no improvement with PT, will consider referral to Pain clinic.  Continue Lyrica in the meantime.  Follow up in one month or sooner as needed.

## 2012-04-04 NOTE — Assessment & Plan Note (Signed)
Refill Ambien

## 2012-04-04 NOTE — Patient Instructions (Addendum)
If you want to talk about your mood, please schedule appointment with me in one month. For leg pain, continue Lyrica three times per day. I will refer you to physical therapy - will call you with time and date.

## 2012-04-04 NOTE — Progress Notes (Signed)
  Subjective:    Patient ID: Stacy Campbell, female    DOB: 1955/02/08, 56 y.o.   MRN: [id number]  HPI  Patient presents w LT lower leg pain.  Leg pain: She continues to see a Dermatologist for severe dermatitis of LT leg, improving. Pain is persistent and located anterior ankle and radiates around ankle. Describes as burning sensation, as if she "sprained ankle." She has tried Gabapentin but it made her nauseated, so she stopped. I sent her Lyrica and does not notice any relief. Endorses intermittent swelling (mostly at night), erythema. Itching has improved. 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 Denies any decreased sensation, but says she feels like her LT leg is weaker than RT. Able to bear weight on both legs, but becomes tired after a few minutes.  She is also here for Ambien refill.  Still helps her sleep at night. No nightmares or other side effects.  Review of Systems Per HPI    Objective:   Physical Exam  Constitutional: She appears well-nourished. No distress.  Skin: rash markedly improved; post-inflammatory hyperpigmentation; few open ulcers on shin from itching; does not appear infected MSK: Full ROM of LT ankle and knee; no tenderness on palpation, no swelling/cyanosis/clubbing      Assessment & Plan:

## 2012-04-04 NOTE — Assessment & Plan Note (Signed)
Continue Ativan TID for anxiety and itching.

## 2012-04-09 ENCOUNTER — Telehealth: Payer: Self-pay | Admitting: Family Medicine

## 2012-04-09 DIAGNOSIS — B182 Chronic viral hepatitis C: Secondary | ICD-10-CM

## 2012-04-09 NOTE — Telephone Encounter (Signed)
Dr Domenick Bookbinder patient received medicaid,would like new ref sent to [facility] for hep C  follow up.thank you. Savannaha Stonerock, [name]

## 2012-04-09 NOTE — Telephone Encounter (Signed)
Was told by Stacy Campbell that she needs to go to [facility] to get treated and have labs for her Hep C - she was very confused as to what she needed.  I told her that a nurse would call her and that she needs to get the information together in order for the nurse to help her,

## 2012-04-09 NOTE — Telephone Encounter (Signed)
The first referral was sent to [facility].  She is now requesting referral be sent to [facility], so we will need a new referral.  Ileana Ladd

## 2012-04-09 NOTE — Addendum Note (Signed)
Addended by: Tye Savoy, Poetry Cerro on: 04/09/2012 01:08 PM   Modules accepted: Orders

## 2012-04-09 NOTE — Telephone Encounter (Signed)
New referral ordered. Thanks.

## 2012-04-09 NOTE — Telephone Encounter (Signed)
GREAT!  A standing referral to Infectious Diseases is still active.  Will route to Lupita Leash for specific request [facility]).  Thank you!

## 2012-04-16 ENCOUNTER — Ambulatory Visit (INDEPENDENT_AMBULATORY_CARE_PROVIDER_SITE_OTHER): Payer: No Typology Code available for payment source | Admitting: Family Medicine

## 2012-04-16 ENCOUNTER — Encounter: Payer: Self-pay | Admitting: Family Medicine

## 2012-04-16 VITALS — BP 175/83 | HR 79 | Temp 98.7°F | Ht 65.0 in | Wt 210.0 lb

## 2012-04-16 DIAGNOSIS — M545 Low back pain, unspecified: Secondary | ICD-10-CM

## 2012-04-16 MED ORDER — TRAMADOL HCL 50 MG PO TABS: 100.0000 mg | ORAL_TABLET | Freq: Three times a day (TID) | ORAL | Status: DC | PRN

## 2012-04-16 MED ORDER — CYCLOBENZAPRINE HCL 10 MG PO TABS: 10.0000 mg | ORAL_TABLET | Freq: Every evening | ORAL | Status: DC | PRN

## 2012-04-16 NOTE — Patient Instructions (Addendum)
For back pain, take Tramadol 1-2 tablets every 8 hours as needed and take Flexeril 1 tablet at bedtime to decrease spasms. Also purchase heating pads and place on your back three times per day. If you do not hear from physical therapy in one week, please call our office to check on your status. If you develop worsening back pain not relieved by medication, loss of bladder and bowel function, or inability to stand, please call go to ER.

## 2012-04-16 NOTE — Progress Notes (Signed)
  Subjective:    Patient ID: Stacy Campbell, female    DOB: 1955/08/29, 57 y.o.   MRN: [id number]  HPI  Same day appointment for low back pain. This has been going on for several years, says pain is constant. Pain is located L5-S1 region, does not radiate. She has tried PT for low back pain about one year ago, which helped a little. For pain, she has tried over the counter Motrin which did not help. She also takes Lyrica daily for LT leg pain neuropathic pain from severe eczema and recurrent cellulitis infections. She sees a dermatologist for this. Denies any signs of saddle anesthesia, urinary or bowel dysfunction.  Review of Systems Per HPI    Objective:   Physical Exam  Constitutional: She appears well-nourished. No distress.  Musculoskeletal:  Limited ROM with flexion and extension; pain worse with hyperextension; pain on palpation of lumbar spine and paravertebral muscles  Skin:  LT lower extremity: much improved      Assessment & Plan:

## 2012-04-18 ENCOUNTER — Encounter: Payer: Self-pay | Admitting: Family Medicine

## 2012-04-18 NOTE — Assessment & Plan Note (Signed)
Back pain likely secondary to compensation from ongoing lower leg pain and sedentary lifestyle.  Will treat with Tramadol 1-2 tablets every 8 hours as needed and take Flexeril 1 tablet at bedtime to decrease spasms.  Heating pads three times per day.  If you do not hear from physical therapy in one week, please call our office to check on your status.

## 2012-04-21 ENCOUNTER — Telehealth: Payer: Self-pay | Admitting: Family Medicine

## 2012-04-21 ENCOUNTER — Other Ambulatory Visit: Payer: Self-pay | Admitting: Family Medicine

## 2012-04-21 MED ORDER — ALPRAZOLAM 1 MG PO TABS: ORAL_TABLET | ORAL | Status: DC

## 2012-04-21 NOTE — Telephone Encounter (Signed)
Will fax xanax to [facility].  Thanks!

## 2012-04-21 NOTE — Telephone Encounter (Signed)
Ditto!

## 2012-05-02 ENCOUNTER — Telehealth: Payer: Self-pay | Admitting: *Deleted

## 2012-05-02 ENCOUNTER — Ambulatory Visit (INDEPENDENT_AMBULATORY_CARE_PROVIDER_SITE_OTHER): Payer: No Typology Code available for payment source | Admitting: Family Medicine

## 2012-05-02 ENCOUNTER — Encounter: Payer: Self-pay | Admitting: Family Medicine

## 2012-05-02 VITALS — BP 157/78 | HR 100 | Temp 98.8°F | Ht 65.0 in | Wt 273.0 lb

## 2012-05-02 DIAGNOSIS — M79605 Pain in left leg: Secondary | ICD-10-CM

## 2012-05-02 DIAGNOSIS — M545 Low back pain, unspecified: Secondary | ICD-10-CM

## 2012-05-02 DIAGNOSIS — M79609 Pain in unspecified limb: Secondary | ICD-10-CM

## 2012-05-02 MED ORDER — CETIRIZINE HCL 10 MG PO CAPS: 10.0000 mg | ORAL_CAPSULE | Freq: Every day | ORAL | Status: DC

## 2012-05-02 MED ORDER — IBUPROFEN 600 MG PO TABS: 600.0000 mg | ORAL_TABLET | Freq: Four times a day (QID) | ORAL | Status: DC | PRN

## 2012-05-02 MED ORDER — LIDOCAINE 5 % EX PTCH: 1.0000 | MEDICATED_PATCH | CUTANEOUS | Status: DC

## 2012-05-02 NOTE — Patient Instructions (Addendum)
For low back pain, apply a Lidoderm patch to most painful area of your back. The best treatment for osteoarthritis is physical therapy.  A referral is in process. If you develop worsening low back pain associated with urinary retention or bowel incontinence, this is an emergency, go to the ER. Schedule follow up appointment with me in 2 months or sooner as needed.

## 2012-05-02 NOTE — Progress Notes (Signed)
  Subjective:    Patient ID: Stacy Campbell, female    DOB: May 01, 1955, 57 y.o.   MRN: [id number]  HPI  Patient presents for follow up chronic back pain and intermittent LT ankle/leg pain.  Back pain has been getting worse in the last several months.  Last visit, prescribed Tramadol and Flexeril.  Patient says neither Tramadol or Flexeril help ease the pain.  Endorses incontinence with cough and sneezing, but no urinary retention.  Denies any bowel incontinence or saddle anesthesia.  Denies any paresthesias.  Patient also complains of dry cough, mostly at night.  Associated with runny nose, nasal congestion, itchy eyes.  Symptoms started about one week ago.  She has a hx of seasonal allergies, but she does not take any OTC medications.     Review of Systems Per HPI    Objective:   Physical Exam Constitutional: She appears well-nourished. No distress.  Musculoskeletal:  Pain on palpation of lumbar spine L5-S1 with limited ROM in flexion and extension; pain worse with hyperextension Skin:  LT lower extremity: 2+ pitting edema LT ankle, but skin looks much better, no signs of cellulitis or open wounds     Assessment & Plan:

## 2012-05-02 NOTE — Assessment & Plan Note (Signed)
Will try Motrin 600 mg PRN pain.  Patient also referred to PT for chronic ankle pain secondary to chronic disease of skin.

## 2012-05-02 NOTE — Assessment & Plan Note (Addendum)
Chronic low back pain not relieved by Tramadol and Flexeril.  Patient cannot tolerate Tylenol due to hx Hep C.  She also has tried Percocet which made her itch and Gabapentin which made her feel nauseated.   - Will try Lidoderm patch and refer patient to Rheumatology for positive ANA and possible SLE work up - Referral has been placed but patient has orange card so it could be another month or so - Advised patient to walk/increase physical activity and lose weight -  She left disability forms for me to fill out.

## 2012-05-02 NOTE — Telephone Encounter (Signed)
Received fax from [facility] requesting prior authorization of Lidoderm patch.  Medicaid form placed in MD's box for completion.  Gaylene Brooks, RN

## 2012-05-05 NOTE — Telephone Encounter (Signed)
Prior authorization info submitted to [employer].  Lidoderm patch approved for 05/05/12-05/05/13.  Confirmation # [id number].  Gaylene Brooks, RN

## 2012-05-05 NOTE — Telephone Encounter (Signed)
Form was completed last Friday.

## 2012-05-13 ENCOUNTER — Ambulatory Visit: Payer: Medicaid Other | Attending: Family Medicine | Admitting: Physical Therapy

## 2012-05-13 DIAGNOSIS — M545 Low back pain, unspecified: Secondary | ICD-10-CM | POA: Insufficient documentation

## 2012-05-13 DIAGNOSIS — IMO0001 Reserved for inherently not codable concepts without codable children: Secondary | ICD-10-CM | POA: Insufficient documentation

## 2012-05-15 ENCOUNTER — Ambulatory Visit: Payer: Medicaid Other | Admitting: Rehabilitation

## 2012-05-20 ENCOUNTER — Ambulatory Visit: Payer: Medicaid Other | Admitting: Rehabilitation

## 2012-05-21 ENCOUNTER — Other Ambulatory Visit: Payer: Self-pay | Admitting: Family Medicine

## 2012-05-22 ENCOUNTER — Ambulatory Visit: Payer: Medicaid Other

## 2012-05-26 ENCOUNTER — Ambulatory Visit (INDEPENDENT_AMBULATORY_CARE_PROVIDER_SITE_OTHER): Payer: Medicaid Other | Admitting: Family Medicine

## 2012-05-26 ENCOUNTER — Encounter: Payer: Self-pay | Admitting: Family Medicine

## 2012-05-26 VITALS — BP 174/77 | HR 75 | Temp 98.8°F | Ht 65.0 in | Wt 274.0 lb

## 2012-05-26 DIAGNOSIS — M545 Low back pain, unspecified: Secondary | ICD-10-CM

## 2012-05-26 MED ORDER — PREGABALIN 75 MG PO CAPS: 75.0000 mg | ORAL_CAPSULE | Freq: Two times a day (BID) | ORAL | Status: DC

## 2012-05-26 NOTE — Assessment & Plan Note (Signed)
PT seems to be helping with low back pain.  She did not pick up Lidocaine patches and says Flexeril, Tramadol are not longer helping.  No red flags indicating MRI spine immediately, but may consider ordering one in future if pain persists.  Red flags reviewed.  Will D/C tramadol and start Lyrica BID.  Patient to call me in 2 weeks if she does not see an improvement and I will increase dose.  Follow up with me in 4 weeks.

## 2012-05-26 NOTE — Progress Notes (Signed)
  Subjective:    Patient ID: Stacy Campbell, female    DOB: [birth year], 58 y.o.   MRN: [id number]  HPI  Patient here for same day appointment for back pain.  Located L4-L5 area. She has been going to PT for chronic low back pain and has been getting electrical stimulation, which seems to help temporarily.  She is also doing ROM exercises at home.  She complains of muscle spasms today, located bilateral LE.  Flexeril is not helping.  She never picked up Lidocaine patches.  She takes Tramadol PRN but this does not improve pain.  Denies any urinary or bowel incontinence. Denies saddle anesthesia.  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  Follow up hepatitis: She has an appointment with Hepatitis clinic on June 4th here in [place].    Review of Systems Per HPI    Objective:   Physical Exam  Constitutional: She appears well-nourished. No distress.  obese  Musculoskeletal:  Lumbar spine: bony tenderness on palpation of L4 and L5 region, but no erythema or obvious abnormality; positive SLR, patient can lift LT leg about 2 inches from table due to pain, RT leg about 30 degrees; normal gait  Neurological: She is alert. No cranial nerve deficit. Coordination normal.       Assessment & Plan:

## 2012-05-26 NOTE — Patient Instructions (Addendum)
Stop taking Tramadol and start taking Lyrica twice a day for 4 weeks. Call me in 2-3 weeks if you do not see any improvement in pain. Schedule follow up appointment with me in 4 weeks or sooner as needed. We will discuss whether or not to order an MRI of your back. If you develop worsening back pain associated with shooting leg pains, loss of control of bladder and bowel, or decreased sensation, please go to ER.

## 2012-05-28 ENCOUNTER — Other Ambulatory Visit: Payer: Self-pay | Admitting: Family Medicine

## 2012-05-29 ENCOUNTER — Telehealth: Payer: Self-pay | Admitting: *Deleted

## 2012-05-29 NOTE — Telephone Encounter (Signed)
Received prior authorization form from [employer] for Lyrica.  Form placed in doctor's box for completion, attached are the preferred drugs. Will have md return form after completion to me. Wyatt Haste, RN-BSN

## 2012-05-29 NOTE — Telephone Encounter (Signed)
PA filled and given to [facility]

## 2012-06-03 ENCOUNTER — Ambulatory Visit: Payer: Medicaid Other | Admitting: Physical Therapy

## 2012-06-03 ENCOUNTER — Telehealth: Payer: Self-pay | Admitting: *Deleted

## 2012-06-03 NOTE — Telephone Encounter (Signed)
Prior authorization approval for Lyrica confirmed with [place] [employer] (PA # [id number]). [facility] informed. Wyatt Haste, RN-BSN

## 2012-06-05 ENCOUNTER — Telehealth: Payer: Self-pay | Admitting: Family Medicine

## 2012-06-05 ENCOUNTER — Encounter: Payer: Medicaid Other | Admitting: Rehabilitation

## 2012-06-05 NOTE — Telephone Encounter (Signed)
Pt is seeing Dr Terri Piedra at [facility], she had an appt today, but since she now has medicaid, she will need a referral from [place].  I told her that she would need to reschedule this appt for today and call us to let us know when the next appt is.  She was on the orange card, but now that she has medicaid, she will need referral.  pls advise

## 2012-06-05 NOTE — Telephone Encounter (Signed)
I called [facility].  Since Stacy Campbell is established with them, they will continue to see her with Medicaid.  Authorization given for six visits good for one year.  Patient had already cancelled her appointment but the appointment slot was still available for today. I ask Dr. Dorita Sciara office to please put Stacy Campbell back into that appointment slot.    I called Stacy Campbell and advised her to keep her 10:50am appointment with Dr. Terri Piedra today.  Referral has been taken care of.  Ileana Ladd

## 2012-06-12 ENCOUNTER — Other Ambulatory Visit: Payer: Self-pay | Admitting: Internal Medicine

## 2012-06-12 DIAGNOSIS — B1920 Unspecified viral hepatitis C without hepatic coma: Secondary | ICD-10-CM

## 2012-06-18 ENCOUNTER — Other Ambulatory Visit: Payer: Self-pay | Admitting: Family Medicine

## 2012-06-19 ENCOUNTER — Ambulatory Visit
Admission: RE | Admit: 2012-06-19 | Discharge: 2012-06-19 | Disposition: A | Discharge status: Home / Self Care | Payer: Medicaid Other | Source: Ambulatory Visit | Attending: Internal Medicine | Admitting: Internal Medicine

## 2012-06-19 DIAGNOSIS — B1920 Unspecified viral hepatitis C without hepatic coma: Secondary | ICD-10-CM

## 2012-06-19 MED ORDER — IOHEXOL 300 MG/ML  SOLN
100.0000 mL | Freq: Once | INTRAMUSCULAR | Status: AC | PRN
  Administered 2012-06-19: 125 mL via INTRAVENOUS

## 2012-06-20 ENCOUNTER — Ambulatory Visit (INDEPENDENT_AMBULATORY_CARE_PROVIDER_SITE_OTHER): Payer: Medicaid Other | Admitting: Family Medicine

## 2012-06-20 ENCOUNTER — Encounter: Payer: Self-pay | Admitting: Family Medicine

## 2012-06-20 VITALS — BP 127/73 | HR 76 | Temp 98.9°F | Ht 65.0 in | Wt 208.0 lb

## 2012-06-20 DIAGNOSIS — M545 Low back pain, unspecified: Secondary | ICD-10-CM

## 2012-06-20 DIAGNOSIS — R011 Cardiac murmur, unspecified: Secondary | ICD-10-CM

## 2012-06-20 DIAGNOSIS — L280 Lichen simplex chronicus: Secondary | ICD-10-CM

## 2012-06-20 DIAGNOSIS — L281 Prurigo nodularis: Secondary | ICD-10-CM

## 2012-06-20 MED ORDER — ALPRAZOLAM 1 MG PO TABS: ORAL_TABLET | ORAL | Status: DC

## 2012-06-20 MED ORDER — PREGABALIN 150 MG PO CAPS: 150.0000 mg | ORAL_CAPSULE | Freq: Two times a day (BID) | ORAL | Status: DC

## 2012-06-20 NOTE — Assessment & Plan Note (Signed)
Sent refill for Xanax to help prevent patient from itching or picking at skin lesions.

## 2012-06-20 NOTE — Assessment & Plan Note (Signed)
3/6 SEM heard on exam.   She is asymptomatic at this time.  If she develops symptoms, will order 2D echo.  Patient agreed with waiting on Echo for now.

## 2012-06-20 NOTE — Progress Notes (Signed)
  Subjective:    Patient ID: Stacy Campbell, female    DOB: 21-Oct-1955, 57 y.o.   MRN: [id number]  HPI  Patient presents to clinic for follow up for Hepatitis C clinic on 6/4.  Medical records scanned in Epic.  She seems motivated to start treatment.  She was told she has a murmur Hep C clinic.  She had an echo done in 2005with normal EF.  She denies any SOB, chest pain, LE edema, or near- or syncopal events.  Low back pain: She says back pain is about an 8 today.  She went to PT (3 visits) and she said wave therapy was helpful and she is going to try to get get a machine for home.  I started her on Lyrica BID and this has improved pain somewhat.  She no longer takes Motrin or Tramadol for pain.  She denies any numbness/tingling, urinary or bowel incontinence.   Review of Systems Per HPI    Objective:   Physical Exam Constitutional: She appears well-nourished. No distress.  Heart: 3/6 SEM, RRR Lungs: CTAB Skin: healing, hyperpigmented lesions on left lower extremity MSK: limited ROM in flexion and extension, unchanged from previous exam Neurological: She is alert. No cranial nerve deficit.      Assessment & Plan:

## 2012-06-20 NOTE — Patient Instructions (Addendum)
It was great to see you today. Please call me if you need me to authorize the wave therapy machine for your back. Start Lyrica 150 mg BID for pain. Continue to see your dermatologist and liver specialist. Schedule follow up appointment to meet your new doctor in 1-2 months or sooner as needed.  It was a pleasure taking care of you and getting to know you, Stacy Campbell. I will miss you.

## 2012-06-20 NOTE — Assessment & Plan Note (Signed)
Reviewed records from Hep C clinic.  See overview.  Patient to follow up with them in 4-6 months.

## 2012-06-20 NOTE — Assessment & Plan Note (Signed)
This is an ongoing struggle, but she says PT has helped and she has a wave therapy machine at home which also helps relieve pain.  Lyrica was started a month ago and patient prefers this over Tramadol.  Will increase Lyrica to 150 mg BID.  Follow up with new PCP in about 1-2 months or sooner as needed.  No red flags on physical exam.

## 2012-06-24 ENCOUNTER — Other Ambulatory Visit: Payer: Medicaid Other

## 2012-07-08 ENCOUNTER — Encounter: Payer: Self-pay | Admitting: Family Medicine

## 2012-07-08 ENCOUNTER — Ambulatory Visit (INDEPENDENT_AMBULATORY_CARE_PROVIDER_SITE_OTHER): Payer: Medicaid Other | Admitting: Family Medicine

## 2012-07-08 VITALS — BP 155/82 | HR 75 | Temp 98.6°F | Ht 65.0 in | Wt 218.7 lb

## 2012-07-08 DIAGNOSIS — M545 Low back pain, unspecified: Secondary | ICD-10-CM

## 2012-07-08 DIAGNOSIS — G8929 Other chronic pain: Secondary | ICD-10-CM

## 2012-07-08 DIAGNOSIS — G4700 Insomnia, unspecified: Secondary | ICD-10-CM

## 2012-07-08 MED ORDER — ZOLPIDEM TARTRATE 10 MG PO TABS: 10.0000 mg | ORAL_TABLET | Freq: Every evening | ORAL | Status: DC | PRN

## 2012-07-08 NOTE — Progress Notes (Signed)
Subjective:     Patient ID: Stacy Campbell, female   DOB: 03-15-1955, 57 y.o.   MRN: [id number]  HPI 57 year old female with anxiety, depression, HTN, and osteoarthritis/chronic back pain who presents for follow up.  1) Back pain & Leg pain - Upon review of the medical record, it appears that the patient has chronic lower back pain. - Patient has been on several medications for this (tramadol, NSAID's, gabapentin and now lyrica) and has tried PT with little improvement. - Xrays have revealed Bilateral L5-S1 facet joint degenerative changes with slightly elongated pars, Mild L3-4 and minimal L4-5 disc space narrowing. - Patient continues to have pain in the lumbar spine. Currently 8/10 in severity. - No associated radicular symptoms or weakness.  No incontinence. Lyrica has improved symptoms some but only "takes the edge off."  - Patient also endorse left leg pain, which is typically associated with muscle cramps.   Review of Systems Per HPI    Objective:   Physical Exam Filed Vitals:   07/08/12 1515  BP: 155/82  Pulse: 75  Temp: 98.6 F (37 C)   Exam: General: well appearing, NAD. Cardiovascular: Regular rate. Slight 2/6 systolic murmur noted at R 2nd ICS. Respiratory: CTAB. No rales, rhonchi, or wheeze. Abdomen: soft, nontender, nondistended. Extremities: warm, well perfused. No LE edema. Skin: Prurigo nodularis noted on L lower extremity (hyperpigemented nodular areas with minimal erythema from excoriation). Neuro: No focal deficits. Muscle strength of lower extremities 5/5.     Assessment:     See Problem list.    Plan:

## 2012-07-08 NOTE — Assessment & Plan Note (Signed)
Given lack of response to several medications and PT, will send to Pain management for further evaluation.  Patient may need MRI but as she has no radicular complaints, this will likely add little to management.  Patient could benefit from facet injections.

## 2012-07-08 NOTE — Assessment & Plan Note (Signed)
Ambien refilled today (#30, 3 refills).

## 2012-07-08 NOTE — Patient Instructions (Addendum)
It was nice meeting you today.  I am going to place a referral to pain medicine to see if you may benefit from back injections or other treatment options.  I have refilled your ambien.  Please feel free to call if you have any questions/concerns.   Follow up in 3-6 months.

## 2012-07-18 ENCOUNTER — Other Ambulatory Visit: Payer: Self-pay | Admitting: Family Medicine

## 2012-07-18 DIAGNOSIS — Z1231 Encounter for screening mammogram for malignant neoplasm of breast: Secondary | ICD-10-CM

## 2012-07-21 ENCOUNTER — Other Ambulatory Visit: Payer: Self-pay | Admitting: Family Medicine

## 2012-07-23 ENCOUNTER — Telehealth: Payer: Self-pay | Admitting: Family Medicine

## 2012-07-23 NOTE — Telephone Encounter (Signed)
Under medications, it appears Rx for Xanax was printed by MD, but can not be located.  Will fwd to MD for advice.  Surina Storts, Darlyne Russian, CMA

## 2012-07-23 NOTE — Telephone Encounter (Signed)
Pt is calling about her prescription for Xanax.   Phone note shows it was printed but cannot find it Please advise

## 2012-07-23 NOTE — Telephone Encounter (Signed)
If it is approved, I will just call it in for pt.  Maggy Wyble, Darlyne Russian, CMA

## 2012-07-23 NOTE — Telephone Encounter (Signed)
Called Xanax into [facility] on [address].  Heaton Sarin, Darlyne Russian, CMA

## 2012-07-23 NOTE — Telephone Encounter (Signed)
I may have just forgot to put it in the box at the front desk.   Can I fax it over? Or can I give you a verbal to re-print.

## 2012-07-24 ENCOUNTER — Telehealth: Payer: Self-pay | Admitting: *Deleted

## 2012-07-24 NOTE — Telephone Encounter (Signed)
Left message on voicemail. Stacy Campbell  

## 2012-07-24 NOTE — Telephone Encounter (Signed)
Message copied by Tanna Savoy on Thu Jul 24, 2012  9:44 AM ------      Message from: Tommie Sams      Created: Tue Jul 22, 2012  4:46 PM       Xanax prescription ready for pick up.            Jayce ------

## 2012-07-31 ENCOUNTER — Ambulatory Visit (HOSPITAL_COMMUNITY)
Admission: RE | Admit: 2012-07-31 | Discharge: 2012-07-31 | Disposition: A | Discharge status: Home / Self Care | Payer: Medicaid Other | Source: Ambulatory Visit | Attending: Family Medicine | Admitting: Family Medicine

## 2012-07-31 DIAGNOSIS — Z1231 Encounter for screening mammogram for malignant neoplasm of breast: Secondary | ICD-10-CM | POA: Insufficient documentation

## 2012-08-04 ENCOUNTER — Ambulatory Visit (INDEPENDENT_AMBULATORY_CARE_PROVIDER_SITE_OTHER): Payer: Medicaid Other | Admitting: Family Medicine

## 2012-08-04 ENCOUNTER — Encounter: Payer: Self-pay | Admitting: Family Medicine

## 2012-08-04 VITALS — BP 142/85 | HR 84 | Ht 65.0 in | Wt 220.5 lb

## 2012-08-04 DIAGNOSIS — L281 Prurigo nodularis: Secondary | ICD-10-CM

## 2012-08-04 DIAGNOSIS — L280 Lichen simplex chronicus: Secondary | ICD-10-CM

## 2012-08-04 DIAGNOSIS — G4762 Sleep related leg cramps: Secondary | ICD-10-CM

## 2012-08-04 DIAGNOSIS — R252 Cramp and spasm: Secondary | ICD-10-CM

## 2012-08-04 MED ORDER — HYDROXYZINE HCL 25 MG PO TABS: 25.0000 mg | ORAL_TABLET | Freq: Four times a day (QID) | ORAL | Status: DC | PRN

## 2012-08-04 MED ORDER — B COMPLEX PO TABS: 1.0000 | ORAL_TABLET | Freq: Every day | ORAL | Status: DC

## 2012-08-04 NOTE — Patient Instructions (Addendum)
It was nice seeing you today.  I am checking some lab work today to see if you have any reversible causes for your cramps.  I am also prescribing Vitamin B complex.  Also be sure to stretch well before bed.  Follow up with me in 3-6 months.

## 2012-08-05 LAB — BASIC METABOLIC PANEL
BUN: 11 mg/dL (ref 6–23)
Chloride: 112 mEq/L (ref 96–112)
Glucose, Bld: 100 mg/dL — ABNORMAL HIGH (ref 70–99)
Potassium: 3.9 mEq/L (ref 3.5–5.3)

## 2012-08-05 LAB — VITAMIN B12: Vitamin B-12: 602 pg/mL (ref 211–911)

## 2012-08-05 LAB — MAGNESIUM: Magnesium: 1.7 mg/dL (ref 1.5–2.5)

## 2012-08-05 NOTE — Progress Notes (Signed)
Subjective:     Patient ID: Stacy Campbell, female   DOB: 1955/03/18, 57 y.o.   MRN: [id number]  HPI 57 year old female presents for worsening leg cramps.  1) Nocturnal leg cramps - Patient reports worsening cramps stating that they wake her from sleep. - They occur most nights of the week; When they occur pain is 10/10 in severity. - Flexeril has not helped   2) Prurigo nodularis - Followed by Dermatology - Patient endorsing severe itching - Currently on 2 creams, one of which is triamcinolone  Review of Systems Patient denies any other complaints at this time.     Objective:   Physical Exam Filed Vitals:   08/04/12 1518  BP: 142/85  Pulse: 84   Exam: General: well appearing, NAD. Cardiovascular: RRR. No murmurs, rubs, or gallops. Respiratory: CTAB. No rales, rhonchi, or wheeze. Abdomen: soft, nontender, nondistended. Skin: areas of hyperpigmentation noted on L lower extremity (prurigo).  Areas of erythema and excoriation noted.     Assessment:     See Problem List     Plan:

## 2012-08-05 NOTE — Assessment & Plan Note (Signed)
Labs obtained today - B12, BMP and Magnesium Advised stretching before bedtime.  Also started patient on B complex vitamin as this has some evidence in Noctural cramps. Will follow up labs and treat accordingly.

## 2012-08-05 NOTE — Assessment & Plan Note (Signed)
Rx sent for Hydroxizine for itching

## 2012-08-08 ENCOUNTER — Other Ambulatory Visit: Payer: Self-pay | Admitting: Physician Assistant

## 2012-08-12 ENCOUNTER — Encounter: Payer: Self-pay | Admitting: Family Medicine

## 2012-08-26 ENCOUNTER — Telehealth: Payer: Self-pay | Admitting: Family Medicine

## 2012-08-26 NOTE — Telephone Encounter (Signed)
Refill requested. Elizabeth Paelyn Smick, RN-BSN  

## 2012-08-26 NOTE — Telephone Encounter (Signed)
Pt called to state that her pharmacy has faxed [place] her refill request for Xanax twice and we have not responded. She would like a update on what is going on. JW

## 2012-09-01 ENCOUNTER — Ambulatory Visit (INDEPENDENT_AMBULATORY_CARE_PROVIDER_SITE_OTHER): Payer: Medicaid Other | Admitting: Family Medicine

## 2012-09-01 ENCOUNTER — Encounter: Payer: Self-pay | Admitting: Family Medicine

## 2012-09-01 VITALS — BP 157/93 | HR 79 | Temp 99.0°F | Ht 65.0 in | Wt 224.0 lb

## 2012-09-01 DIAGNOSIS — G8929 Other chronic pain: Secondary | ICD-10-CM

## 2012-09-01 DIAGNOSIS — M545 Low back pain, unspecified: Secondary | ICD-10-CM

## 2012-09-01 MED ORDER — LISINOPRIL 20 MG PO TABS: 20.0000 mg | ORAL_TABLET | Freq: Every day | ORAL | Status: DC

## 2012-09-01 NOTE — Patient Instructions (Addendum)
It was nice seeing you again today.  I'm sorry you're having trouble with your back.  Please follow up with Pain management; You have tried so many options that I think you would benefit from Pain management and evaluation for facet injections.  Please follow up in 6 months or earlier as needed.

## 2012-09-01 NOTE — Assessment & Plan Note (Signed)
Patient advised to follow up with pain management as this is a chronic issue and she has had a number of failed therapies.   I think she would benefit from facet injections.

## 2012-09-01 NOTE — Progress Notes (Signed)
Subjective:     Patient ID: Stacy Campbell, female   DOB: [birth year], 57 y.o.   MRN: [id number]  HPI 57 year old presents for follow up.  1) Low back pain - Back pain is chronic in nature and located in the lumbar spine - Pain is severe - 8/10 in severity.  Pain is described as sharp.  - Worsens with movement.   - Improves with use of TENS unit.  Has not been aided by Gabapentin, PT, Tramadol.  Slight improvement with Ibuprofen.   ROS: Occasional LE numbness/tingling.  No incontinence.  No saddle anesthesia.  No fevers, chills.  Patient does report recent nausea since she started Antibiotic (per Derm)  Review of Systems Per HPI    Objective:   Physical Exam Filed Vitals:   09/01/12 0916  BP: 157/93  Pulse: 79  Temp: 99 F (37.2 C)  Exam: General: well appearing, NAD. Cardiovascular: RRR. No murmurs, rubs, or gallops. Respiratory: CTAB. No rales, rhonchi, or wheeze. Abdomen: soft, nontender, nondistended. Back: Lumbar spine and upper portion of sacrum (midline) tender to palpation.  Extremities: L leg - dermatitis noted.  Neuro: No focal deficits.      Assessment:     See Problem list    Plan:

## 2012-10-10 ENCOUNTER — Ambulatory Visit (INDEPENDENT_AMBULATORY_CARE_PROVIDER_SITE_OTHER): Payer: Medicaid Other | Admitting: Family Medicine

## 2012-10-10 VITALS — BP 165/107 | HR 86 | Temp 98.2°F | Ht 65.0 in | Wt 220.4 lb

## 2012-10-10 DIAGNOSIS — J309 Allergic rhinitis, unspecified: Secondary | ICD-10-CM

## 2012-10-10 DIAGNOSIS — R110 Nausea: Secondary | ICD-10-CM

## 2012-10-10 DIAGNOSIS — Z23 Encounter for immunization: Secondary | ICD-10-CM

## 2012-10-10 DIAGNOSIS — L280 Lichen simplex chronicus: Secondary | ICD-10-CM

## 2012-10-10 DIAGNOSIS — L281 Prurigo nodularis: Secondary | ICD-10-CM

## 2012-10-10 MED ORDER — PROMETHAZINE HCL 25 MG PO TABS: 25.0000 mg | ORAL_TABLET | Freq: Three times a day (TID) | ORAL | Status: DC | PRN

## 2012-10-10 MED ORDER — ONDANSETRON HCL 4 MG PO TABS: 4.0000 mg | ORAL_TABLET | Freq: Three times a day (TID) | ORAL | Status: DC | PRN

## 2012-10-10 MED ORDER — CETIRIZINE HCL 10 MG PO CAPS: 10.0000 mg | ORAL_CAPSULE | Freq: Every day | ORAL | Status: DC

## 2012-10-10 NOTE — Patient Instructions (Addendum)
It was nice to see you today.  I have prescribed 2 agents for nausea.  Use them as indicated.  In regards to your URI/Allergic symptoms, I am refilling your Zyrtec.  If you continue to have problems please follow up.  Follow up in 6 months or earlier if needed.

## 2012-10-12 DIAGNOSIS — J3089 Other allergic rhinitis: Secondary | ICD-10-CM | POA: Insufficient documentation

## 2012-10-12 DIAGNOSIS — R110 Nausea: Secondary | ICD-10-CM | POA: Insufficient documentation

## 2012-10-12 NOTE — Progress Notes (Signed)
Subjective:     Patient ID: Stacy Campbell, female   DOB: 17-Jan-1955, 57 y.o.   MRN: [id number]  HPI 57 year old female presents for a same day appointment.  Concerns are below.  1) Allergies - Patient reports watery eyes, runny nose, and itchy throat for the past 2 weeks. - No fevers, chills, SOB, productive cough - No exacerbating/relieving factors. - No known sick contacts  2) Nausea - Patient recently started treatment for Hep C and has been experiencing nausea (known side effect of the medication) - This is managed by Dr. Thea Silversmith.  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 - She reports draining. - Patient continues to have significant itching which has worsened with the use of new treatment for hepatitis.   Review of Systems Per HPI    Objective:   Physical Exam Filed Vitals:   10/10/12 1444  BP: 165/107  Pulse: 86  Temp: 98.2 F (36.8 C)   Exam: General: well appearing female in NAD. HEENT: NCAT. No pharyngeal erythema. Normal TM's. Cardiovascular: RRR. No murmurs, rubs, or gallops. Respiratory: CTAB. No rales, rhonchi, or wheeze. Skin: Left LE - multiple areas of hypopigmentation noted.  Areas of erythema and excoriation also noted.  1 dime-sized ulceration with minimal drainage noted.      Assessment:     See Problem List    Plan:

## 2012-10-12 NOTE — Assessment & Plan Note (Signed)
Rx sent for Zofran and Phenergan.

## 2012-10-12 NOTE — Assessment & Plan Note (Signed)
Encouraged patient to follow up with dermatology.  Patient reported that she had Keflex at home.  Advised patient to restart and call Derm.

## 2012-10-12 NOTE — Assessment & Plan Note (Signed)
Rx sent for Zyrtec

## 2012-10-13 ENCOUNTER — Telehealth: Payer: Self-pay | Admitting: Family Medicine

## 2012-10-13 NOTE — Telephone Encounter (Signed)
Please advise. Stacy Campbell S  

## 2012-10-13 NOTE — Telephone Encounter (Signed)
Patient calls stating that Zyrtec is not helping with her cough, would like the prescription that Dr. Tye Savoy called in for her months ago (she does not know the name), called in. She states that really helped with her cough.

## 2012-10-15 ENCOUNTER — Other Ambulatory Visit: Payer: Self-pay | Admitting: Family Medicine

## 2012-10-15 MED ORDER — BENZONATATE 100 MG PO CAPS: 100.0000 mg | ORAL_CAPSULE | Freq: Three times a day (TID) | ORAL | Status: DC | PRN

## 2012-10-15 NOTE — Telephone Encounter (Signed)
Is there anyway she can tell me what the medication is.  I checked epic and I don't see any record of the medication.

## 2012-10-31 ENCOUNTER — Encounter: Payer: Self-pay | Admitting: Family Medicine

## 2012-10-31 ENCOUNTER — Ambulatory Visit (INDEPENDENT_AMBULATORY_CARE_PROVIDER_SITE_OTHER): Payer: Medicaid Other | Admitting: Family Medicine

## 2012-10-31 VITALS — BP 142/64 | HR 104 | Temp 98.0°F | Ht 65.0 in | Wt 222.4 lb

## 2012-10-31 DIAGNOSIS — J45901 Unspecified asthma with (acute) exacerbation: Secondary | ICD-10-CM

## 2012-10-31 MED ORDER — ALBUTEROL SULFATE (2.5 MG/3ML) 0.083% IN NEBU
2.5000 mg | INHALATION_SOLUTION | Freq: Once | RESPIRATORY_TRACT | Status: AC
  Administered 2012-10-31: 2.5 mg via RESPIRATORY_TRACT

## 2012-10-31 MED ORDER — ALBUTEROL SULFATE HFA 108 (90 BASE) MCG/ACT IN AERS: 2.0000 | INHALATION_SPRAY | RESPIRATORY_TRACT | Status: DC | PRN

## 2012-10-31 MED ORDER — PREDNISONE 20 MG PO TABS: 40.0000 mg | ORAL_TABLET | Freq: Every day | ORAL | Status: DC

## 2012-10-31 MED ORDER — IPRATROPIUM BROMIDE 0.02 % IN SOLN
0.5000 mg | Freq: Once | RESPIRATORY_TRACT | Status: AC
  Administered 2012-10-31: 0.5 mg via RESPIRATORY_TRACT

## 2012-10-31 NOTE — Patient Instructions (Signed)
It has been a pleasure to see you today. Please take the medications as prescribed. Albuterol use it scheduled 2 puffs every 4 hours for today then re\re 6 hours tomorrow and continue it as needed for wheezing or shortness of breath. Prednisone take 2 tablets for 5 days.  Follow up with [place] in one week or sooner if needed. If at any given point you develop increased shortness of breath you will need to get evaluated right away

## 2012-10-31 NOTE — Progress Notes (Signed)
Family Medicine Office Visit Note   Subjective:   Patient ID: Stacy Campbell, female  DOB: 04-23-1955, 57 y.o.. MRN: [id number]   Pt that comes today for asthma follow up. She has PMHx significant for intermittent asthma and denies asthma attacks for 10 years.  Her current treatment consists of ocassional albuterol. She brings inhaler that has been expired since April this year.   She was seen at the beginning of the month with allergy symptoms of clear rhinorrhea, itchy eyes and congestion. She was recommended to take Tessalon perles and antihistaminic. Patient reports that a week ago she started to feel worsening of her symptoms, chest tightness, and wheezing. She has used albuterol up to 4 times a day with no improvement of her symptoms. Denies fever, chills, vomiting. She had nausea related to her medication for hepatitis C but controlled when she takes antiemetics for it.   Review of Systems:  Per HPI  Objective:   Physical Exam: Gen:  NAD HEENT: Moist mucous membranes. Normal oropharynx, no exudates. Nasal mucosa is pale but no polyps/masses are seen.  Ears: normal ear canal and TM. Maxillary and frontal sinus with good translucency.  CV: Regular rate and rhythm, no murmurs rubs or gallops PULM: Normal work of breathing but wheezing is audible without auscultation. After nebulizer treatment wheezing resolved and there were not rales present.  EXT: No edema  Assessment & Plan:

## 2012-10-31 NOTE — Assessment & Plan Note (Signed)
Exacerbation of Asthma with good response to office albuterol.  Albuterol scheduled, short course of prednisone. Discussed signs of worsening condition that should prompt re-evaluation. F/u in 1 week or sooner if needed.

## 2012-11-04 ENCOUNTER — Telehealth: Payer: Self-pay | Admitting: Family Medicine

## 2012-11-04 NOTE — Telephone Encounter (Signed)
Would like her ambien refilled [facility] on [address]

## 2012-11-05 MED ORDER — ZOLPIDEM TARTRATE 10 MG PO TABS: 10.0000 mg | ORAL_TABLET | Freq: Every evening | ORAL | Status: DC | PRN

## 2012-11-05 NOTE — Telephone Encounter (Signed)
Dr. Adriana Simas is on vacation and I am covering his inbox. I will refill one month's worth of Ambien for pt. Please call her to notify.  Latrelle Dodrill, MD

## 2012-11-07 NOTE — Telephone Encounter (Signed)
Has been placed in to be faxed pile per Dr. Pollie Meyer

## 2012-11-14 ENCOUNTER — Encounter: Payer: Self-pay | Admitting: Family Medicine

## 2012-11-14 ENCOUNTER — Ambulatory Visit (INDEPENDENT_AMBULATORY_CARE_PROVIDER_SITE_OTHER): Payer: Medicaid Other | Admitting: Family Medicine

## 2012-11-14 VITALS — BP 186/99 | HR 101 | Temp 98.0°F | Ht 65.0 in | Wt 225.0 lb

## 2012-11-14 DIAGNOSIS — J45909 Unspecified asthma, uncomplicated: Secondary | ICD-10-CM

## 2012-11-14 DIAGNOSIS — F339 Major depressive disorder, recurrent, unspecified: Secondary | ICD-10-CM

## 2012-11-14 MED ORDER — DOXEPIN HCL 25 MG PO CAPS: 25.0000 mg | ORAL_CAPSULE | Freq: Every day | ORAL | Status: DC

## 2012-11-14 MED ORDER — SERTRALINE HCL 50 MG PO TABS: 150.0000 mg | ORAL_TABLET | Freq: Every day | ORAL | Status: DC

## 2012-11-14 MED ORDER — MOMETASONE FURO-FORMOTEROL FUM 200-5 MCG/ACT IN AERO: 1.0000 | INHALATION_SPRAY | Freq: Every day | RESPIRATORY_TRACT | Status: DC

## 2012-11-14 NOTE — Patient Instructions (Signed)
It was nice to see you today.  I'm so sorry you're feeling bad.  I have increased your Zoloft to 150 mg daily.  I am also trying Doxepin for your itching (take as prescribed).  Continue using the inhaler Elwin Sleight) daily.  Use the albuterol as needed.   Follow up in 3 months.

## 2012-11-16 NOTE — Assessment & Plan Note (Signed)
Zoloft increased today.  Will call patient in 2 weeks to 1 month to check in.

## 2012-11-16 NOTE — Assessment & Plan Note (Signed)
Uncontrolled. Will try Dulera (sample given).

## 2012-11-16 NOTE — Progress Notes (Signed)
Subjective:     Patient ID: Stacy Campbell, female   DOB: 1955/09/26, 57 y.o.   MRN: [id number]  HPI Ms. Hannan presents to the clinic today for follow up.  1) Asthma/SOB - Patient has a remote history of asthma and tobacco use (a few years) - Patient recently and treat for exacerbation - Patient presents for follow up and reports continued wheezing and SOB requiring BID albuterol.  - No recent fever or chills, but she does note cough. - No reported chest pain.  2) Depression - Patient has a known history of depression - She reports for the past several months (and more recently the past few months intermittent) she has felt more down and depressed. - She states this is triggered by the thoughts of her daughter (she passed away in 06/15/2007 from Liver disease).  She reports feeling responsible for her daughter's death (she said that her daughter often said "help me" and there was nothing she could do for her).   Review of Systems Per HPI    Objective:   Physical Exam Filed Vitals:   11/14/12 1453  BP: 186/99  Pulse: 101  Temp: 98 F (36.7 C)   Exam: General: well developed, well nourished; NAD. Cardiovascular: RRR. No murmurs, rubs, or gallops. Respiratory: Prolonged expiratory phase and diffuse expiratory wheezing.  Skin: areas of hyperpigmentation noted on L lower extremity. Areas of erythema and excoriation noted. Small open wound noted - no purulence or fluctuance. Psych: patient tearful during history; flat affect.     Assessment/Plan:     See Problem list  A total of 25 minutes were spent face-to-face with the patient during this encounter and over half of that time was spent on counseling and coordination of care.

## 2012-12-08 ENCOUNTER — Other Ambulatory Visit: Payer: Self-pay | Admitting: Family Medicine

## 2012-12-08 MED ORDER — ZOLPIDEM TARTRATE 10 MG PO TABS: 10.0000 mg | ORAL_TABLET | Freq: Every evening | ORAL | Status: DC | PRN

## 2012-12-08 MED ORDER — HYDROCHLOROTHIAZIDE 25 MG PO TABS: 25.0000 mg | ORAL_TABLET | Freq: Every day | ORAL | Status: DC

## 2012-12-08 MED ORDER — CARVEDILOL 25 MG PO TABS: 25.0000 mg | ORAL_TABLET | Freq: Two times a day (BID) | ORAL | Status: DC

## 2012-12-08 MED ORDER — LISINOPRIL 20 MG PO TABS: 20.0000 mg | ORAL_TABLET | Freq: Every day | ORAL | Status: DC

## 2012-12-18 ENCOUNTER — Other Ambulatory Visit: Payer: Self-pay | Admitting: Internal Medicine

## 2012-12-18 DIAGNOSIS — C220 Liver cell carcinoma: Secondary | ICD-10-CM

## 2012-12-23 ENCOUNTER — Other Ambulatory Visit: Payer: Medicaid Other

## 2013-01-05 ENCOUNTER — Telehealth: Payer: Self-pay | Admitting: Family Medicine

## 2013-01-05 NOTE — Telephone Encounter (Signed)
Pt called and needs a refill on her Ambien left up front. jw °

## 2013-01-06 ENCOUNTER — Telehealth: Payer: Self-pay | Admitting: Family Medicine

## 2013-01-06 MED ORDER — ZOLPIDEM TARTRATE 10 MG PO TABS: 10.0000 mg | ORAL_TABLET | Freq: Every evening | ORAL | Status: DC | PRN

## 2013-01-06 NOTE — Telephone Encounter (Signed)
Patient informed,voiced understanding.Stacy Campbell  

## 2013-01-06 NOTE — Telephone Encounter (Signed)
Rx refilled.  Will place up front for pick up later today.

## 2013-01-06 NOTE — Telephone Encounter (Signed)
Refilled ambien as patient is here to pick up prescription.

## 2013-01-06 NOTE — Telephone Encounter (Signed)
Patient calls again today. Advised patient that RX has been refilled but has not been placed up front. Can someone please call patient once RX is placed up front. Thanks!

## 2013-01-14 ENCOUNTER — Ambulatory Visit (INDEPENDENT_AMBULATORY_CARE_PROVIDER_SITE_OTHER): Payer: Medicaid Other | Admitting: Family Medicine

## 2013-01-14 ENCOUNTER — Encounter: Payer: Self-pay | Admitting: Family Medicine

## 2013-01-14 VITALS — BP 142/72 | HR 84 | Temp 98.9°F | Ht 65.0 in | Wt 227.0 lb

## 2013-01-14 DIAGNOSIS — L280 Lichen simplex chronicus: Secondary | ICD-10-CM

## 2013-01-14 DIAGNOSIS — L281 Prurigo nodularis: Secondary | ICD-10-CM

## 2013-01-14 DIAGNOSIS — R059 Cough, unspecified: Secondary | ICD-10-CM

## 2013-01-14 DIAGNOSIS — R05 Cough: Secondary | ICD-10-CM

## 2013-01-14 MED ORDER — GUAIFENESIN-CODEINE 100-10 MG/5ML PO SOLN: 10.0000 mL | ORAL | Status: DC | PRN

## 2013-01-14 MED ORDER — TRIAMCINOLONE ACETONIDE 0.5 % EX OINT: 1.0000 "application " | TOPICAL_OINTMENT | Freq: Two times a day (BID) | CUTANEOUS | Status: DC

## 2013-01-14 MED ORDER — BENZONATATE 100 MG PO CAPS: 100.0000 mg | ORAL_CAPSULE | Freq: Three times a day (TID) | ORAL | Status: DC | PRN

## 2013-01-14 NOTE — Assessment & Plan Note (Signed)
Increased Triamcinolone to 0.5 % today. Will follow closely and evaluate for improvement.

## 2013-01-14 NOTE — Patient Instructions (Signed)
It was great to see you today.  I am treating your cough with Tessalon and cough syrup as needed.  I have increased the dose of your topical steroids.  Apply for 2 weeks to see if you have improvement.   Follow up with me in the next 3-6 months. You are in need of a pap smear and colonoscopy.

## 2013-01-14 NOTE — Progress Notes (Signed)
Subjective:     Patient ID: Stacy Campbell, female   DOB: 04/17/1955, 58 y.o.   MRN: [id number]  HPI 58 year old female presents for evaluation of Cough and for follow up of prurigo nodularis.  1) Sore throat & Cough - Patient reports that she has cough and mild sore throat since Christmas - She denies any fevers, chills - She has been taking Tessalon perles with some relief in her symptoms.  2) Prurigo nodularis - Patient continues to have severe itching and rash of her LE's (L>R). - Itching is difficult to control - She is currently off antbiotics - Still no significant improvement despite topical triamcinolone  Review of Systems Per HPI    Objective:   Physical Exam Exam: General: well appearing female in NAD.  HEENT: NCAT.  Oropharynx clear.  Normal TM's bilaterally. Cardiovascular: RRR. 2/6 systolic murmur noted today at 2nd R ICS. Respiratory: CTAB. No rales, rhonchi, or wheeze. Skin: areas of hyperpigmentation noted on L lower extremity. Areas of erythema and excoriation noted. No drainage.  Psych: flat affect noted.      Assessment:  See Problem List

## 2013-01-14 NOTE — Assessment & Plan Note (Signed)
Appears viral in origin.  Will treat with tessalon and Guaifenesin/codeine.

## 2013-01-20 ENCOUNTER — Telehealth: Payer: Self-pay | Admitting: Family Medicine

## 2013-01-20 MED ORDER — METHOCARBAMOL 500 MG PO TABS: 500.0000 mg | ORAL_TABLET | Freq: Four times a day (QID) | ORAL | Status: DC

## 2013-01-20 NOTE — Telephone Encounter (Signed)
Patient should discontinue Flexeril.  I have prescribed Robaxin in lieu.

## 2013-01-20 NOTE — Telephone Encounter (Signed)
Attempted to call patient. Message patient can not be reached at this time

## 2013-01-20 NOTE — Telephone Encounter (Signed)
Pt called and wanted doctor to know that her shoulder is still having spasms. She is taking 20 mg of flexeril and it is still not helping. She would like something else called in. jw

## 2013-01-20 NOTE — Telephone Encounter (Signed)
Please advise. Stacy Campbell  

## 2013-01-21 NOTE — Telephone Encounter (Signed)
Attempted to call patient, she cannot receive messages at this time

## 2013-01-22 NOTE — Telephone Encounter (Signed)
Closing chart due to no answer

## 2013-01-29 ENCOUNTER — Telehealth: Payer: Self-pay | Admitting: Family Medicine

## 2013-01-29 NOTE — Telephone Encounter (Signed)
Rx is up front ready for pick up.  

## 2013-01-29 NOTE — Telephone Encounter (Signed)
Needs refill on xanax [facility] on [address]

## 2013-01-30 NOTE — Telephone Encounter (Signed)
Attempted to call patient, but the number states that she cannot be reached

## 2013-03-10 ENCOUNTER — Ambulatory Visit
Admission: RE | Admit: 2013-03-10 | Discharge: 2013-03-10 | Disposition: A | Discharge status: Home / Self Care | Payer: Medicaid Other | Source: Ambulatory Visit | Attending: Internal Medicine | Admitting: Internal Medicine

## 2013-03-10 DIAGNOSIS — C220 Liver cell carcinoma: Secondary | ICD-10-CM

## 2013-04-17 ENCOUNTER — Encounter: Payer: Self-pay | Admitting: *Deleted

## 2013-04-17 ENCOUNTER — Ambulatory Visit (INDEPENDENT_AMBULATORY_CARE_PROVIDER_SITE_OTHER): Payer: Medicaid Other | Admitting: Family Medicine

## 2013-04-17 ENCOUNTER — Encounter: Payer: Self-pay | Admitting: Family Medicine

## 2013-04-17 VITALS — BP 162/80 | HR 89 | Temp 98.9°F | Ht 65.0 in | Wt 234.0 lb

## 2013-04-17 DIAGNOSIS — F339 Major depressive disorder, recurrent, unspecified: Secondary | ICD-10-CM

## 2013-04-17 DIAGNOSIS — G8929 Other chronic pain: Secondary | ICD-10-CM

## 2013-04-17 DIAGNOSIS — M545 Low back pain, unspecified: Secondary | ICD-10-CM

## 2013-04-17 DIAGNOSIS — B1920 Unspecified viral hepatitis C without hepatic coma: Secondary | ICD-10-CM

## 2013-04-17 DIAGNOSIS — H539 Unspecified visual disturbance: Secondary | ICD-10-CM | POA: Insufficient documentation

## 2013-04-17 DIAGNOSIS — M79609 Pain in unspecified limb: Secondary | ICD-10-CM

## 2013-04-17 DIAGNOSIS — M79605 Pain in left leg: Secondary | ICD-10-CM | POA: Insufficient documentation

## 2013-04-17 DIAGNOSIS — R252 Cramp and spasm: Secondary | ICD-10-CM

## 2013-04-17 MED ORDER — TIZANIDINE HCL 2 MG PO CAPS: 2.0000 mg | ORAL_CAPSULE | Freq: Three times a day (TID) | ORAL | Status: DC | PRN

## 2013-04-17 MED ORDER — DULOXETINE HCL 30 MG PO CPEP: 30.0000 mg | ORAL_CAPSULE | Freq: Every day | ORAL | Status: DC

## 2013-04-17 NOTE — Patient Instructions (Signed)
Here's what we talked about today.  Spasm - We are going to try Zanaflex for spasm.  Be sure to stay hydrated and stretch prior to bed.  Vision changes - Referral to Opthalmology  Depression/Pain - We are switching you to Cymbalta while tapering Zoloft. Regimen:  Week 1 - Take Zoloft 100 mg daily and Cymbalta 30 mg daily; Week 2 - Take zoloft 50 mg daily and Cymbalta 30 mg daily.  Week 3 - Take only Cymbalta 30 mg daily.  Back pain - Please follow up with me in ~ 1 month to discuss options again.  If you decide what you would like to do in the interim, please let me know.   Follow up in 1 month.

## 2013-04-17 NOTE — Progress Notes (Signed)
   Subjective:    Patient ID: Stacy Campbell, female    DOB: 03-08-1955, 58 y.o.   MRN: [id number]  HPI 58 year old female with Anxiety/depression, Hep C s/p treatment with simeprovir and sofosbuvir, chronic low back pain, prurigo nodularis, and HTN who presents for follow up.  Complaints/issues today:  1) Left leg pain - Patient has had been battling a dermatologic condition (suspected prurigo nodularis) for quite some time.  She has been seen by Dermatology and biopsy revealed psoriaform and spongiotic dermatitis. - She is being treated with topical Triamcinolone - She reports that she is having pain at the site of her rash.  Pain is moderate to severe and described as sharp/burning.   - She has tried Gabapentin and Lyrica with no improvement  2) Muscle cramps - She reports frequent muscle cramps in her lower extremities - They occur sporadically - These cramps are severe and debilitating when they occur and last for minutes - She has tried Robaxin and Flexeril without relief.  3) Depression - Patient has a known history of major depression - She is currently on Zoloft 150 mg daily and has not had much improvement in symptoms despite increasing dose - Symptoms and mood have been worsened by life stressors/events - Her daughter died a few years ago and she is now taking care of her 2 grandchildren.  Her grandchildren have had a difficult time coping with the loss of their mother, further complicating things.  Additionally, she is currently out of work and has been out of work since the development of the above dermatologic condition.  4) Vision changes - Patient reports that she has been having some blurry vision and has not had a eye exam in ~ 6 years - No recent trauma or potential foreign body exposure - No reports of dry eyes  Social Hx - Former smoker.  Review of Systems Per HPI    Objective:   Physical Exam Filed Vitals:   04/17/13 1401  BP: 162/80  Pulse: 89    Temp: 98.9 F (37.2 C)   Exam: General: well appearing, NAD. HEENT: Eyes: PERRLA.  Normal conjunctiva.  No foreign body noted.   Cardiovascular: RRR. No murmurs, rubs, or gallops. Respiratory: CTAB. No rales, rhonchi, or wheeze. Extremities: Left LE: hyperpigmented, raised erythematous areas noted on the anterior lower leg.  Areas of excoriation noted.  Patient also has several eschars from scratching as well.  Psych: Flat affect noted. Patient tearful when discussing depression and life circumstances.     Assessment & Plan:  See Problem List

## 2013-04-17 NOTE — Assessment & Plan Note (Signed)
Patient mentioned continued pain to me today. I advised physical activity.  I also discussed several therapeutic options - PT, referral for Facet injection, additional medications, etc.  Patient unsure of how she would like to proceed. Patient to think about this and follow up in 1 month.

## 2013-04-17 NOTE — Assessment & Plan Note (Signed)
No improvement with flexeril or robaxin. Advised stretching.  Will also try Zanaflex PRN.

## 2013-04-17 NOTE — Assessment & Plan Note (Signed)
Patient in need of ophtho exam. Referral placed.

## 2013-04-17 NOTE — Assessment & Plan Note (Signed)
Appears associated with prurigo/dermatitis and seems neuropathic in nature. Will try Cymbalta given failure of gabapentin and lyrica.  This may also help her back pain.

## 2013-04-17 NOTE — Progress Notes (Signed)
Received fax from [facility] requesting prior authorization of duloxetine 30 mg cas.  Form placed in MD's box for completion along with Medicaid formulary.  Nolene Ebbs, RN

## 2013-04-17 NOTE — Assessment & Plan Note (Signed)
No improvement with increase in Zoloft. Will plan to taper off zoloft while starting Cymbalta (chosen given beneficial effects in neuropathic pain).   Patient given tapering procolol. Patient also agreed to meeting with Dr. Gwenlyn Saran.  Her business card was given today. Follow up in 1 month.

## 2013-04-21 ENCOUNTER — Telehealth: Payer: Self-pay | Admitting: *Deleted

## 2013-04-21 MED ORDER — TIZANIDINE HCL 2 MG PO TABS: 2.0000 mg | ORAL_TABLET | Freq: Three times a day (TID) | ORAL | Status: DC | PRN

## 2013-04-21 NOTE — Telephone Encounter (Signed)
Rx changed to tablets 

## 2013-04-21 NOTE — Telephone Encounter (Signed)
Received prior authorization for Tizanidine HCL 2 mg capsule.  Need to change to tablet form.  Derl Barrow, RN

## 2013-04-22 NOTE — Progress Notes (Signed)
Called placed to [facility] regarding PA for Duloxetine HCl, per Pharmacist; PA is no longer needed.  Derl Barrow, RN

## 2013-04-24 ENCOUNTER — Other Ambulatory Visit: Payer: Self-pay | Admitting: *Deleted

## 2013-04-24 MED ORDER — PROMETHAZINE HCL 25 MG PO TABS: 25.0000 mg | ORAL_TABLET | Freq: Three times a day (TID) | ORAL | Status: DC | PRN

## 2013-05-19 ENCOUNTER — Ambulatory Visit (INDEPENDENT_AMBULATORY_CARE_PROVIDER_SITE_OTHER): Payer: Medicaid Other | Admitting: Family Medicine

## 2013-05-19 ENCOUNTER — Encounter: Payer: Self-pay | Admitting: Family Medicine

## 2013-05-19 VITALS — BP 152/81 | HR 83 | Temp 99.0°F | Ht 65.0 in | Wt 233.0 lb

## 2013-05-19 DIAGNOSIS — M545 Low back pain, unspecified: Secondary | ICD-10-CM

## 2013-05-19 DIAGNOSIS — G8929 Other chronic pain: Secondary | ICD-10-CM

## 2013-05-19 DIAGNOSIS — J45909 Unspecified asthma, uncomplicated: Secondary | ICD-10-CM

## 2013-05-19 DIAGNOSIS — F339 Major depressive disorder, recurrent, unspecified: Secondary | ICD-10-CM

## 2013-05-19 MED ORDER — ALBUTEROL SULFATE HFA 108 (90 BASE) MCG/ACT IN AERS: 2.0000 | INHALATION_SPRAY | Freq: Four times a day (QID) | RESPIRATORY_TRACT | Status: DC | PRN

## 2013-05-19 MED ORDER — ZOLPIDEM TARTRATE 10 MG PO TABS: 10.0000 mg | ORAL_TABLET | Freq: Every evening | ORAL | Status: DC | PRN

## 2013-05-19 MED ORDER — DULOXETINE HCL 30 MG PO CPEP
60.0000 mg | ORAL_CAPSULE | Freq: Every day | ORAL | Status: DC
Start: 2013-05-19 — End: 2013-10-29

## 2013-05-19 MED ORDER — DULOXETINE HCL 30 MG PO CPEP: 30.0000 mg | ORAL_CAPSULE | Freq: Every day | ORAL | Status: DC

## 2013-05-19 NOTE — Progress Notes (Signed)
   Subjective:    Patient ID: Stacy Campbell, female    DOB: Nov 01, 1955, 58 y.o.   MRN: [id number]  HPI 58 year old female with Depression, Hx of Asthma, Hep C s/p treatment, HTN, and prurigo nodularis who presents for follow up.  1) Back/Leg pain - Patient is having low back pain and bilateral LE pain - Pain is moderate in severity and described as achy in character - No associated numbness/tingling.  She does report some weakness.  - Pain is exacerbated by standing/physical activity.  - She states her pain improves with tramadol.  2) Depression - Patient now on Cymbalta and has noticed a mild improvement - She still struggles with her mood and has significant stressors (financial stressors, home life, etc).  3) Wheezing - Patient has a prior history of asthma which she states that she "out grew" - Recently she has been experiencing wheezing - This appears to be exacerbated by allergies  - No associated chest discomfort, palpitations, increasing LE edema.   Social Hx - Reviewed.  Former smoker.   Review of Systems Per HPI with the following additions: No fever, chills.     Objective:   Physical Exam Filed Vitals:   05/19/13 0843  BP: 152/81  Pulse: 83  Temp: 99 F (37.2 C)   Exam: General: well appearing, NAD. Cardiovascular: RRR. No murmurs, rubs, or gallops. Respiratory: Mild wheezing noted.  No rales, rhonchi appreciated.  Back: lumbar spine paraspinal musculature tender to palpation.  ROM decreased in extension. Extremities: No LE edema. Psych: Flat affect noted.     Assessment & Plan:  See Problem List

## 2013-05-19 NOTE — Patient Instructions (Addendum)
It was nice to see you today.   Below is what we talked about today and the plan:  1) Leg pain/back pain - This appears to be primarily muscular in origin. - I am going to send you to PT. - I also want you to start exercising 2-3 times a week.  (walking 30 mins each time)  2) Depression - I am going to increase your Cymbalta to 60 mg daily.   3) Wheezing - I have sent in an inhaler.  Please use as prescribed. - If you continue to have difficulty with allergies please try and OTC antihistamine - On your way out schedule a visit with Dr. Valentina Lucks for spirometry.   Follow up in 1-3 months.

## 2013-05-19 NOTE — Assessment & Plan Note (Signed)
Likely multifactorial (OA, muscle cramps, lack of physical activity, obesity). Plan: Referral to PT and continued tramadol.

## 2013-05-19 NOTE — Assessment & Plan Note (Addendum)
Wheezing likely secondary to asthma. Will send patient for spirometry to confirm. Albuterol inhaler given today.

## 2013-05-19 NOTE — Assessment & Plan Note (Signed)
Slight improvement with Cymbalta. Increasing to 60 mg daily today.

## 2013-05-20 ENCOUNTER — Telehealth: Payer: Self-pay | Admitting: Psychology

## 2013-05-20 NOTE — Telephone Encounter (Signed)
Ms. Pointer called to schedule a beh med appointment.  She had mentioned in her visit with Dr. Lacinda Axon that there are times when she questions what she is doing and the people in her life and feels as though she is floating around rather than really in the moment.  She has a long standing history of depression as well as significant stressors raising two grandchildren after her daughter died.  We scheduled for 09-Jun-2022 at 10:00.

## 2013-05-25 ENCOUNTER — Ambulatory Visit (INDEPENDENT_AMBULATORY_CARE_PROVIDER_SITE_OTHER): Payer: Medicaid Other | Admitting: Pharmacist

## 2013-05-25 ENCOUNTER — Encounter: Payer: Self-pay | Admitting: Pharmacist

## 2013-05-25 VITALS — BP 146/58 | HR 63 | Ht 64.96 in | Wt 230.8 lb

## 2013-05-25 DIAGNOSIS — J4489 Other specified chronic obstructive pulmonary disease: Secondary | ICD-10-CM | POA: Insufficient documentation

## 2013-05-25 DIAGNOSIS — J45909 Unspecified asthma, uncomplicated: Secondary | ICD-10-CM

## 2013-05-25 DIAGNOSIS — J449 Chronic obstructive pulmonary disease, unspecified: Secondary | ICD-10-CM

## 2013-05-25 MED ORDER — FLUTICASONE PROPIONATE 50 MCG/ACT NA SUSP: 2.0000 | Freq: Every day | NASAL | Status: DC

## 2013-05-25 MED ORDER — MOMETASONE FURO-FORMOTEROL FUM 200-5 MCG/ACT IN AERO: 2.0000 | INHALATION_SPRAY | Freq: Two times a day (BID) | RESPIRATORY_TRACT | Status: DC

## 2013-05-25 MED ORDER — MONTELUKAST SODIUM 10 MG PO TABS: 10.0000 mg | ORAL_TABLET | Freq: Every day | ORAL | Status: DC

## 2013-05-25 NOTE — Progress Notes (Signed)
S:    Patient arrives in good spirits. Presents for lung function evaluation.   Patient reports she has been using her albuterol inhaler 4 times per day (2 puffs each time) due to wheezing on a daily basis. She started using her albuterol 2 years ago after she had difficulty breathing (reports cannot lay down sometimes due to difficulty breathing). Prior to this time, she last used an albuterol inhaler when she was a teenager. She reports her allergies are bothersome (allergic rhinitis, eczema, and respiratory). She has tried Zantac, Benadryl, Zyrtec, and Singulair in the past. Singulair seemed to work the best. She has never used a nasal steroid.  Patient has started/stopped smoking since 2008. In 2008, she was smoking 3 times/wk due to stress. In 2009, her smoking increased to 1 pack/day (3-4 cig/day) after the passing of her daughter. She reports that she stopped smoking in 2010.   O: See "scanned report" or Documentation Flowsheet (discrete results - PFTs) for  Spirometry results. Patient provided good effort while attempting spirometry.   Lung Age = 89 Albuterol Neb  Lot# [id number]     Exp. 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 Post nebulized albuterol tx revealed significant improvement in FVC/FEV1. She reported her breathing improved from 7-8 to a 10 after treatment. Her last albuterol and Dulera treatment prior to PFTs was yesterday evening.  The development of her chronic lung disease is more likely from her history of asthma than smoking. Patient has been experiencing wheezing for 2 years and taking Dulera 1 puff daily and albuterol PRN. Plan is to increase Dulera to 2 puffs BID, start Singulair 10mg  daily, and start Flonase 2 puffs daily.  Educated patient on purpose, proper use, potential adverse effects including risk of esophageal candidiasis and need to rinse mouth after each use.  Reviewed results of pulmonary function  tests.  Pt verbalized understanding of results and education.  Written pt instructions provided.  F/U Clinic visit in 4-6 weeks. Total time in face to face counseling 50 minutes. Patient seen with  Garter, PharmD Candidate,  Sheliah Mends,  PharmD Resident, [place] PharmD Resident.

## 2013-05-25 NOTE — Assessment & Plan Note (Signed)
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 Post nebulized albuterol tx revealed significant improvement in FVC/FEV1. She reported her breathing improved from 7-8 to a 10 after treatment. Her last albuterol and Dulera treatment prior to PFTs was yesterday evening.  The development of her chronic lung disease is more likely from her history of asthma than smoking. Patient has been experiencing wheezing for 2 years and taking Dulera 1 puff daily and albuterol PRN. Plan is to increase Dulera to 2 puffs BID, start Singulair 10mg  daily, and start Flonase 2 puffs daily.  Educated patient on purpose, proper use, potential adverse effects including risk of esophageal candidiasis and need to rinse mouth after each use.  Reviewed results of pulmonary function tests.  Pt verbalized understanding of results and education.

## 2013-05-25 NOTE — Patient Instructions (Addendum)
Thank you for coming to your appointment today!  Based on your lung function evaluation, you have chronic lung disease.   The following medication changes will be made - increase Dulera to 2 puffs twice daily, start Singulair 10mg  daily, and start Flonase 2 sprays each nostril once daily.   Please call [facility] at [phone] and schedule an appointment for 4-6 weeks from now.

## 2013-05-25 NOTE — Progress Notes (Signed)
Patient ID: Stacy Campbell, female   DOB: 04/12/1955, 57 y.o.   MRN: [id number] Reviewed: Agree with Dr. Koval's documentation and management. 

## 2013-05-25 NOTE — Assessment & Plan Note (Addendum)
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 Post nebulized albuterol tx revealed significant improvement in FVC/FEV1. She reported her breathing improved from 7-8 to a 10/10 after treatment. Her last albuterol and Dulera treatment prior to PFTs was yesterday evening.  The development of her chronic lung disease is more likely from her history of asthma than smoking. Patient has been experiencing wheezing for 2 years and taking Dulera 1 puff daily and albuterol PRN. Plan is to increase Dulera to 2 puffs BID, start Singulair 10mg  daily, and start Flonase 2 puffs daily.  Educated patient on purpose, proper use, potential adverse effects including risk of esophageal candidiasis and need to rinse mouth after each use.  Reviewed results of pulmonary function tests.  Pt verbalized understanding of results and education.  Written pt instructions provided.  F/U Clinic visit in 4-6 weeks. Total time in face to face counseling 50 minutes. Patient seen with  Garter, PharmD Candidate,  Sheliah Mends,  PharmD Resident, Eligah East PharmD Resident.

## 2013-05-28 ENCOUNTER — Telehealth: Payer: Self-pay | Admitting: Family Medicine

## 2013-05-28 MED ORDER — MOMETASONE FURO-FORMOTEROL FUM 200-5 MCG/ACT IN AERO: 2.0000 | INHALATION_SPRAY | Freq: Two times a day (BID) | RESPIRATORY_TRACT | Status: DC

## 2013-05-28 NOTE — Telephone Encounter (Signed)
Received fax from [facility] needing Rx for [facility] inhaler.  Derl Barrow, RN

## 2013-05-28 NOTE — Telephone Encounter (Signed)
Pt called and needs a refill on her Ruthe Mannan called in. jw

## 2013-05-28 NOTE — Telephone Encounter (Signed)
Pt called because the pharmacy is saving that they do not have a request for her Dulera. Can we call this in. jw

## 2013-05-28 NOTE — Telephone Encounter (Signed)
New Rx for [facility] provided.  Sample does NOT appear to have been provided at clinic visit.

## 2013-06-02 ENCOUNTER — Ambulatory Visit (INDEPENDENT_AMBULATORY_CARE_PROVIDER_SITE_OTHER): Payer: Medicaid Other | Admitting: Psychology

## 2013-06-02 DIAGNOSIS — F339 Major depressive disorder, recurrent, unspecified: Secondary | ICD-10-CM

## 2013-06-02 NOTE — Progress Notes (Signed)
Presenting Issue: Stacy Campbell presents for her initial beh med appointment.  She reports difficulty with mood secondary to the death of her 58 year old daughter in 10/2007 and subsequently becoming the primary care taker of her two grandchildren, [name] (currently 30) and [name] (currently 10).  She has good family support from several of her four brothers and her children.  Report of symptoms: Has periods where she feels like her mind leaves her body and floats away.  Can have people in her house and will feel out of it and wonder "who are these people?" and "why am I here?"  In addition to this, she experiences a lot of stress for financial reasons and in raising her grandchildren - specifically Kersey who is regularly disrespectful and has trouble both in and out of school.    Duration of symptoms: She noted depressive symptoms prior to her daughter's death however there was a definite deterioration since then.    Impact on function: She does not like to leave her house if she doesn't have to.  Would rather be in her room and left alone.    Psychiatric History (include age of onset of first mood disturbance): Did not elicit complete psychiatric history today.  - Diagnoses:  Depression and anxiety are on her problem list.  She noted a personal history of depression.  Did not elicit specific symptoms today.    - Treatment (include hospitalizations, pharmacotherapy and outpatient therapy):  Cymbalta and Xanax as well as Ambien occasionally for sleep.  Family history of psychiatric issues: Denied a family history of mental health issues and substance abuse with the exception of schizophrenia in a nephew.  Parents died in early to mid-50s.  She noted asthma and DM in them.    Current and history of substance use: Did not assess.    Medical conditions that might explain or contribute to symptoms: She has a chronic leg condition that has been difficult to diagnosis / treat.  She has gone to the  wound center and dermatologists for treatment.  Has made it difficult to work / function.    Other: Worked at [employer] as her first job.  Stopped for 10 years to raise her kids.  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  Has applied for disability.  Thinks getting this will be helpful.  On appeal now.

## 2013-06-02 NOTE — Patient Instructions (Signed)
Please schedule a follow-up for Tuesday, June 2nd at 9:00.  Call me at [phone] if you are not able to make this appointment. I gave you some information about dissociation.  Please read it and let me know what parts fit or don't fit for you.  We can talk more about this. The most potential for success you have in changing your relationship with Stacy Campbell is changing your reaction to her or your approach to her.  Consider not engaging when she is giving you a hard time (this includes lectures or negative comments).  Consider experimenting with decreasing your negativity / criticism with her. Make a mark on a piece of paper any time you hear yourself say something negative.  Try to keep these to a minimum.  Notice how you feel.

## 2013-06-02 NOTE — Assessment & Plan Note (Addendum)
Ms. Morneault has significant medical comorbidity coupled with grief and significant psychosocial stress raising grandchildren without much support.  She has conflicting thoughts and feelings about her daughter's death (feeling responsible while also feeling like she wasn't able to be involved secondary to daughter's boyfriend).  Can take a look at pharmacologic options to see if there is something that might be more beneficial.  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  Did not elicit a trauma history today and will need to do that.  Depending on how long this has been going on, can be a result of the significant stress she has been under since the death of her daughter.  Briefly explained today that it is often considered a coping mechanism.  Intended to give her some reading material but forgot.  Will follow at her next appointment.    Spent some time on an aspect of her life that seems to cause considerable stress - her granddaughter, [place].  See patient instructions for further plan.

## 2013-06-08 ENCOUNTER — Ambulatory Visit: Payer: Medicaid Other | Attending: Family Medicine

## 2013-06-08 DIAGNOSIS — M545 Low back pain, unspecified: Secondary | ICD-10-CM | POA: Insufficient documentation

## 2013-06-08 DIAGNOSIS — R262 Difficulty in walking, not elsewhere classified: Secondary | ICD-10-CM | POA: Insufficient documentation

## 2013-06-08 DIAGNOSIS — IMO0001 Reserved for inherently not codable concepts without codable children: Secondary | ICD-10-CM | POA: Insufficient documentation

## 2013-06-08 DIAGNOSIS — M6281 Muscle weakness (generalized): Secondary | ICD-10-CM | POA: Insufficient documentation

## 2013-06-09 ENCOUNTER — Ambulatory Visit (INDEPENDENT_AMBULATORY_CARE_PROVIDER_SITE_OTHER): Payer: Medicaid Other | Admitting: Psychology

## 2013-06-09 DIAGNOSIS — F339 Major depressive disorder, recurrent, unspecified: Secondary | ICD-10-CM

## 2013-06-09 NOTE — Assessment & Plan Note (Signed)
Report of mood remains sad and irritable most days.  She notes brief periods of time where she feels joyful but they are fleeting.  I wonder if she had better access to and understanding of her emotions if she might feel less burdened by them.  Currently focusing in on her feelings regarding [place].  I think these are very much tied to the death of her daughter.    Will follow as scheduled.  I would like to see her back sooner but couldn't fit it in.  Will need to focus on the dissociation symptoms and how to manage next visit.    Face to face counseling 50 minutes.

## 2013-06-09 NOTE — Patient Instructions (Signed)
Please schedule a follow-up for:  June 23rd at 3:00.  Call me at [phone] if you can't make this appointment.   I am glad you were able to look up the information on dissociation and saw what pieces fit for you. We continued talking about your reaction to [facility].  Great job focusing on not saying negative things.  Remember that if she comes at you with attitude, she may be feeling hurt or afraid and if you can connect to that, you will likely feel better. Vulnerability is not a bad thing - even if it feels bad.  Almond Lint has a talk on vulnerability that you may find helpful.  It is on Youtube and is called a Clare Gandy talk.

## 2013-06-09 NOTE — Progress Notes (Signed)
Stacy Campbell presents for follow-up.  I had forgotten to give her a print-out on dissociation after our last visit.  She Googled it and was surprised at how consistent her experience is with what she read.  She noted that trauma is often a starting point for dissociation.  Discussed.  She denies a history of physical or sexual abuse.  Cites her son going to prison for 8 years when he was a late teen / early 71s as being a changing point.  Also states she had a relationship with a man for 20 years (never married but had kids) and two years after they broke up, he married someone.  This made her question a lot of things.  Finally - the death of her daughter and the subsequent stress may be a contributor to her tendency to dissociate.  Focused some on how she sees things are and how she would like them to be:  Current:  Doormat Would like to be viewed as:  a good person that is there for others but also is loved and cared for by others.  Current:  Overburdened; bogged down by things she did not anticipate Would like to be:  Working until her retirement age and enjoying aspects of her life rather than just trying to get through.

## 2013-06-23 ENCOUNTER — Encounter: Payer: Self-pay | Admitting: Family Medicine

## 2013-06-23 ENCOUNTER — Ambulatory Visit (INDEPENDENT_AMBULATORY_CARE_PROVIDER_SITE_OTHER): Payer: Medicaid Other | Admitting: Family Medicine

## 2013-06-23 VITALS — BP 155/89 | HR 105 | Temp 99.6°F | Ht 65.0 in | Wt 238.0 lb

## 2013-06-23 DIAGNOSIS — R252 Cramp and spasm: Secondary | ICD-10-CM

## 2013-06-23 MED ORDER — ALPRAZOLAM 1 MG PO TABS: ORAL_TABLET | ORAL | Status: DC

## 2013-06-23 MED ORDER — MAGNESIUM OXIDE 400 MG PO TABS: 400.0000 mg | ORAL_TABLET | Freq: Every day | ORAL | Status: DC

## 2013-06-23 NOTE — Patient Instructions (Signed)
It was nice to see you today.  Unfortunately, I am not sure what is causing your spasms.  I am going to try you on some Magnesium to see if this helps. If it does not, I am out of therapeutic options as there are just no good treatments for this.  Follow up with me soon (if you need to)

## 2013-06-23 NOTE — Progress Notes (Signed)
   Subjective:    Patient ID: Stacy Campbell, female    DOB: 02/18/1955, 58 y.o.   MRN: [id number]  HPI 58 year old female with multiple chronic medical problems presents for evaluation of muscle cramps/spasm.  1) Muscle spasm/cramping - Typically occur 3-4 times daily. - Occur primarily in the lower extremities; they start more distally and progress proximally. - She has tried stretching, mustard, Zanaflex, flexeril without relief.  Has also tried PT without improvement.  - She denies any associated symptoms/problems.  She also states that she drinks plenty of fluids to ensure that she stays hydrated.   Review of Systems Per HPI    Objective:   Physical Exam Filed Vitals:   06/23/13 1518  BP: 155/89  Pulse: 105  Temp: 99.6 F (37.6 C)   Exam: General: appears irritated/agitated, NAD.  Cardiovascular: RRR. No murmurs, rubs, or gallops. Respiratory: CTAB. No rales, rhonchi, or wheeze. Extremities: No erythema noted. Trace LE edema.      Assessment & Plan:  See Problem List

## 2013-06-24 NOTE — Assessment & Plan Note (Signed)
We have tried multiple interventions/treatments with no improvement. Stacy Campbell Magnesium is on the low end of normal, so I will try supplementation to see if this will improve cramps. I do not know of any further work up/treatments (neither do attendings Dr. Nori Riis and Erin Hearing).

## 2013-06-30 ENCOUNTER — Ambulatory Visit (INDEPENDENT_AMBULATORY_CARE_PROVIDER_SITE_OTHER): Payer: Medicaid Other | Admitting: Psychology

## 2013-06-30 DIAGNOSIS — F411 Generalized anxiety disorder: Secondary | ICD-10-CM

## 2013-06-30 DIAGNOSIS — F339 Major depressive disorder, recurrent, unspecified: Secondary | ICD-10-CM

## 2013-06-30 NOTE — Assessment & Plan Note (Signed)
Reports she has "panic attacks" that consist of chest feeling heavy, some light-headedness, butterflies in her stomach and a feeling that something bad is going to happen.  She endorses SOB with this.  She then gets a headache.  Whole thing lasts about an hour.  Doesn't sound like classic panic symptoms in terms of duration and intensity.  Need to assess whether there is anticipatory anxiety.

## 2013-06-30 NOTE — Patient Instructions (Signed)
Please schedule a follow-up for:  July 7th at 11:00. So glad you found the gratitude stuff helpful. You agreed to practice kindness with one person in your life (baby brother).  You said expressing appreciation and sitting down to talk with him would be a good way to do that.  I am excited to hear how this goes.
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
## 2013-06-30 NOTE — Assessment & Plan Note (Signed)
My read on this depression is that indeed, Ms. Frankowski has a negative view of herself, her world and the future.  She spontaneously denies suicidal ideation - surprised that she has not desire to do this despite how badly she feels.  She continues to report some fleeting happiness - this lasts minutes only.  She seems to frequently act out irritably with others which seems like a function of feeling sad or fearful.  Discussed how she might work on trying to feel better about herself but given her tendency to treat others with hostility, this will likely be tricky.  Discussed the possibility of changing how she behaves and noticing how she feels (similar to the intervention with Chad).  She seemed to embrace this approach.  See patient instructions for further plan.

## 2013-07-03 ENCOUNTER — Ambulatory Visit (INDEPENDENT_AMBULATORY_CARE_PROVIDER_SITE_OTHER): Payer: Medicaid Other | Admitting: Pharmacist

## 2013-07-03 ENCOUNTER — Encounter: Payer: Self-pay | Admitting: Pharmacist

## 2013-07-03 VITALS — Ht 65.5 in | Wt 238.0 lb

## 2013-07-03 DIAGNOSIS — J449 Chronic obstructive pulmonary disease, unspecified: Secondary | ICD-10-CM

## 2013-07-03 MED ORDER — PREDNISONE 20 MG PO TABS: 40.0000 mg | ORAL_TABLET | Freq: Every day | ORAL | Status: DC

## 2013-07-03 NOTE — Progress Notes (Signed)
S:    Patient arrives stating she continues to have similar symptoms as previous visit.  Presents for pulmonary follow up. Patient increased dose of Dulera from 1 puff to 2 puffs twice a day as directed at last visit. She began Singulair (montelukast) at the same time and reports "a little improvement" of symptoms. She is still using albuterol 3-4 times a day since the last visit and states the albuterol doesn't help. She reports wheezing daily. She is sleeps on 2 pillows to help her breathe. She endorses triggers of heat, allergies, and walking up stairs. She has a cough that has gone unchanged since increased in dose of Dulera. She will cough until she is breathless 2 or 3 times a day.    PE: Lung Auscultation by Dr. Lacinda Axon - Wheezing bilaterally.   A/P:  Patient with a long standing history of uncontrolled asthma recently diagnosed with COPD remains uncontrolled despite use of albuterol 4 times a day and Dulera 200/5- 2 puffs twice a day. Inhaler technique was assessed and deemed appropriate. Following an assessment by Dr. Lacinda Axon, we prescribed prednisone 40mg  daily for 5 days.  Educated patient on purpose, proper use, potential adverse effects. Pt verbalized understanding. Patient to follow up with Dr. Lacinda Axon on July 1st.  Written pt instructions provided.  F/U Clinic visit July 1st. Total time in face to face counseling 30 minutes.  Patient seen with  Whitney Muse, PharmD Candidate, and Wilfred Curtis PharmD Resident. Marland Kitchen

## 2013-07-03 NOTE — Assessment & Plan Note (Signed)
Patient with a long standing history of uncontrolled asthma recently diagnosed with COPD remains uncontrolled despite use of albuterol 4 times a day and Dulera 200/5- 2 puffs twice a day. Inhaler technique was assessed and deemed appropriate. Following an assessment by Dr. Lacinda Axon, we prescribed prednisone 40mg  daily for 5 days.  Educated patient on purpose, proper use, potential adverse effects. Pt verbalized understanding. Patient to follow up with Dr. Lacinda Axon on July 1st.  Written pt instructions provided.  F/U Clinic visit July 1st. Total time in face to face counseling 30 minutes.  Patient seen with  Whitney Muse, PharmD Candidate, and Wilfred Curtis PharmD Resident.

## 2013-07-03 NOTE — Progress Notes (Signed)
Patient ID: Stacy Campbell, female   DOB: 1955-01-21, 58 y.o.   MRN: [id number] Reviewed: Agree with Dr. Graylin Shiver documentation and management.

## 2013-07-03 NOTE — Patient Instructions (Signed)
Thank you for coming in today.   We sent a prescription for prednisone 20 mg- 2 tablets every morning with breakfast.  Be sure to keep your appointment on July 1st.

## 2013-07-08 ENCOUNTER — Encounter: Payer: Self-pay | Admitting: Family Medicine

## 2013-07-08 ENCOUNTER — Ambulatory Visit (INDEPENDENT_AMBULATORY_CARE_PROVIDER_SITE_OTHER): Payer: Medicaid Other | Admitting: Family Medicine

## 2013-07-08 VITALS — BP 165/125 | HR 90 | Temp 99.0°F | Ht 65.5 in | Wt 239.0 lb

## 2013-07-08 DIAGNOSIS — M545 Low back pain, unspecified: Secondary | ICD-10-CM

## 2013-07-08 DIAGNOSIS — L280 Lichen simplex chronicus: Secondary | ICD-10-CM

## 2013-07-08 DIAGNOSIS — G8929 Other chronic pain: Secondary | ICD-10-CM

## 2013-07-08 DIAGNOSIS — L281 Prurigo nodularis: Secondary | ICD-10-CM

## 2013-07-08 MED ORDER — PROMETHAZINE HCL 25 MG PO TABS: 25.0000 mg | ORAL_TABLET | Freq: Three times a day (TID) | ORAL | Status: DC | PRN

## 2013-07-08 MED ORDER — HYDROCODONE-ACETAMINOPHEN 5-325 MG PO TABS: 1.0000 | ORAL_TABLET | Freq: Two times a day (BID) | ORAL | Status: DC | PRN

## 2013-07-08 MED ORDER — CLOBETASOL PROPIONATE 0.05 % EX OINT
1.0000 | TOPICAL_OINTMENT | Freq: Two times a day (BID) | CUTANEOUS | Status: DC
Start: 2013-07-08 — End: 2014-03-22

## 2013-07-08 MED ORDER — TIZANIDINE HCL 2 MG PO TABS: 2.0000 mg | ORAL_TABLET | Freq: Three times a day (TID) | ORAL | Status: DC | PRN

## 2013-07-08 NOTE — Assessment & Plan Note (Signed)
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 I increased the potency of her steroid to clobetasol.  Will give trial of 2 week course. Encouraged followup with dermatology.

## 2013-07-08 NOTE — Assessment & Plan Note (Signed)
Sent in TENS unit today. Tramadol not helping.  Will try Vicodin for pain and have patient follow up.

## 2013-07-08 NOTE — Patient Instructions (Signed)
It was nice to see you today.  Continue elevating the leg.  I have prescribed a higher potency steroid for this.  Use it for no more than 2 weeks.  Please call and follow up with Dermatology.  If a referral is needed I will be happy to do so.  Regarding your back, I am trying a different pain medication. Continue to try to be active and mobile as this will help.  I have sent in your Rx's. I will fax the tens unit.  Follow up in 1-3 months.

## 2013-07-08 NOTE — Progress Notes (Signed)
   Subjective:    Patient ID: Stacy Campbell, female    DOB: 08/22/1955, 58 y.o.   MRN: [id number]  HPI Stacy Campbell is a 58 year old female who presents today with several concerns:  1) L leg swelling and pain - Patient has longstanding dermatitis of Left LE with associated pain and itching - Patient also likely has venous stasis leading to swelling - She reports that it is been worsening recently and associated with worsening pain. - She reports that she has had no improvement despite medications and topical ointments.  2) Medication refill - Patient requesting TENS unit, and refill of Tramadol and Zanaflex.   Review of Systems Per HPI    Objective:   Physical Exam Filed Vitals:   07/08/13 0909  BP: 165/125  Pulse: 90  Temp:    Exam: General: well appearing, NAD.  Cardiovascular: RRR. No murmurs, rubs, or gallops. Respiratory: CTAB. No rales, rhonchi, or wheeze. Abdomen: soft, nontender, nondistended. Extremities: Trace LE edema.  Skin: dry, scaly raised areas noted on anterior/lateral lower legs.  Assessment & Plan:  See Problem List

## 2013-07-14 ENCOUNTER — Ambulatory Visit (INDEPENDENT_AMBULATORY_CARE_PROVIDER_SITE_OTHER): Payer: Medicaid Other | Admitting: Psychology

## 2013-07-14 DIAGNOSIS — F339 Major depressive disorder, recurrent, unspecified: Secondary | ICD-10-CM

## 2013-07-14 DIAGNOSIS — F411 Generalized anxiety disorder: Secondary | ICD-10-CM

## 2013-07-14 NOTE — Progress Notes (Signed)
Stacy Campbell presents for follow-up.  She reports she is feeling extra sad today and can not pinpoint why.  We talked about her health issues (leg continues to give her problems, back pain and COPD) and how it affects her function.  She feels bad not working not only because she sees herself on her family's limited resources but also because she enjoyed working and appreciated the positive influence it had on her identity.  Second big topic had to do with family relationships and how they were negatively impacted by her daughter's death.  Her daughter died six years ago this 25-Oct-2022 and her other children feel like they lost their mother when their sister died.  For instance, Faythe refuses to let anyone take a "family" picture because Alicia (sp?) won't be in it.

## 2013-07-14 NOTE — Assessment & Plan Note (Addendum)
Report of mood is sad.  Affect is consistent.  She appears more sad than last visit.  She cries readily but is able to laugh and take another's perspective.  Her sadness seems to be regularly expressed in hostility or irritability.  She doesn't "want to be bothered" and this helps keep others away.  Much of her sadness seems tied to her daughter's death today.  She had her first dream of her this past Saturday and she felt very sad when she awoke.    Will continue to work on increasing awareness of how she is feeling in the hopes that she will be able to manage her emotions and decrease her irritability.  Will also work on grief - specifically a greater acceptance of her daughter's death.  Dawnisha states that her current approach is not healthy.  At session end, floated out idea of bringing a family member with her to explore the grief issues in greater depth.  Asked her to consider this possibility while reminding her that it is her time and if this doesn't feel right, that is fine.    See patient instructions for further plan.

## 2013-07-14 NOTE — Assessment & Plan Note (Signed)
Continues to have "anxiety attacks" described similarly to her last report.  She did report anticipatory anxiety.  Duration still seems a bit long.  Did not ask specifically if these are mostly triggered.  Will follow up on this next visit and decide whether to make a specific diagnosis of panic disorder.

## 2013-07-14 NOTE — Patient Instructions (Signed)
Please schedule a follow-up for:  July 20th at 10:00. We covered a lot today and I am so grateful that you are willing to share so much with me.  I know it is hard to talk about these things and yet I think it is important. There are several places where you can make some changes that might help you feel better.  Two that we touched on today include voc rehab (decided to put that on hold until your disability status is resolved) and having more acceptance of your daughter's death.  There are some specific behaviors around this that you think are unhealthy and that need changing.  Change is hard.  Please consider which ones seem the easiest or most important to change.  Think SPECIFIC BEHAVIORS. We also can continue to work on you getting a better sense of how you are feeling and ways to manage it so you don't act so irritably with your family.

## 2013-07-16 ENCOUNTER — Other Ambulatory Visit: Payer: Self-pay | Admitting: Family Medicine

## 2013-07-16 DIAGNOSIS — Z1231 Encounter for screening mammogram for malignant neoplasm of breast: Secondary | ICD-10-CM

## 2013-07-20 ENCOUNTER — Telehealth: Payer: Self-pay | Admitting: Family Medicine

## 2013-07-20 DIAGNOSIS — L281 Prurigo nodularis: Secondary | ICD-10-CM

## 2013-07-20 NOTE — Telephone Encounter (Signed)
Patient was told by Dr. 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 Please send a new referral to [facility] for patient.

## 2013-07-20 NOTE — Telephone Encounter (Signed)
Referral sent 

## 2013-07-20 NOTE — Telephone Encounter (Signed)
Please advise.Thank you.Shellie Goettl S  

## 2013-07-23 ENCOUNTER — Telehealth: Payer: Self-pay | Admitting: Family Medicine

## 2013-07-23 NOTE — Telephone Encounter (Signed)
Dr. Ledell Peoples office will see Stacy Campbell because she's already established with them.  It's new Medicaid patients they are not taking. Please send referral to them for her.  Her last appt with them was in May, so she is not considered as new.

## 2013-07-27 ENCOUNTER — Ambulatory Visit: Payer: Medicaid Other | Admitting: Psychology

## 2013-07-30 ENCOUNTER — Ambulatory Visit (INDEPENDENT_AMBULATORY_CARE_PROVIDER_SITE_OTHER): Payer: Medicaid Other | Admitting: Psychology

## 2013-07-30 DIAGNOSIS — F339 Major depressive disorder, recurrent, unspecified: Secondary | ICD-10-CM

## 2013-07-30 NOTE — Progress Notes (Signed)
Stacy Campbell presents for follow-up.  She reported she continues to not do well.  She is feeling so sad.  This seems like a switch since when I started to see her but she says it predates our meetings and has been going on at least since 2013.  She feels like she is in a hole and can't get out.    Major symptoms include sadness, tearfulness, irritability, sleep disturbance, appetite disturbance, lack of energy, guilty feelings, difficulty concentrating.  With regard to sleep, she reports going to bed around 10p and awakening around 1a.  She says she does not go back to sleep.  Television is on.  She watches it intermittently but mostly just stares.  About three times a week she eats caramels in the middle of the night (about 20 of them) mindlessly.    With regard to appetite - reports eating one meal per day around 7:00 p.  She does not regularly eat breakfast or lunch although her grandkids (they live with her) do eat regular meals.  She says she eats fruits and vegetables most days.  Sometimes feels like eating but when she tries, she loses her appetite.

## 2013-07-30 NOTE — Patient Instructions (Signed)
Please schedule a follow-up for:  July 30th at 11:00. You agreed to be scheduled in our [facility] to take a closer look at your medicines.  That is appointment is for:  August 12th at 11:30.  This is the appointment with Dr. Tammi Klippel and the other physician as well as me.  We will focus on your medications.   You are doing some things that are not helpful for your mood or body.  These include watching television all night (and eating some stuff while doing that) as well as not eating regular meals.  You agreed to focus on the sleep issue.  For one week (7 days) you agreed to not have your television on if you awaken in the middle of the night.  You will get your ipad and do a relaxation exercise for at least 20 minutes (but hopefully longer).  You can YouTube a relaxation exercise.  Do not look at the screen.   The other thing is a gratitude journal.  You agreed to write down three gratitudes nightly.  They can not be the same every night (although some overlap is okay).  It has to be something you are grateful for in the last 24 hours.  This will help train your brain to look for goodness in your life.

## 2013-07-30 NOTE — Assessment & Plan Note (Signed)
Report of mood is sad.  Affect is consistent.  She is very tearful during session which is consistent with last visit but not before.  It could be that she is opening up / letting her guard down but to me it feels like a deterioration in mood (she denies this).  She is interested in looking at medication options so scheduled her for [place].  She denied active SI but passive ideation (it would be okay if I died) has been present in the past.    From a CBT perspective, she is engaging in a lot of behaviors that are likely not helping.  She agreed to experiment with sleep / tv and gratitudes.  Time frame is one week.  Will see her back then to see how it went.  See patient instructions for further plan.

## 2013-08-04 ENCOUNTER — Encounter: Payer: Self-pay | Admitting: Family Medicine

## 2013-08-04 ENCOUNTER — Ambulatory Visit (HOSPITAL_COMMUNITY)
Admission: RE | Admit: 2013-08-04 | Discharge: 2013-08-04 | Disposition: A | Discharge status: Home / Self Care | Payer: Medicaid Other | Source: Ambulatory Visit | Attending: Family Medicine | Admitting: Family Medicine

## 2013-08-04 DIAGNOSIS — Z1231 Encounter for screening mammogram for malignant neoplasm of breast: Secondary | ICD-10-CM | POA: Diagnosis present

## 2013-08-05 ENCOUNTER — Other Ambulatory Visit: Payer: Self-pay | Admitting: Family Medicine

## 2013-08-05 DIAGNOSIS — R928 Other abnormal and inconclusive findings on diagnostic imaging of breast: Secondary | ICD-10-CM

## 2013-08-06 ENCOUNTER — Ambulatory Visit (INDEPENDENT_AMBULATORY_CARE_PROVIDER_SITE_OTHER): Payer: Medicaid Other | Admitting: Psychology

## 2013-08-06 DIAGNOSIS — F339 Major depressive disorder, recurrent, unspecified: Secondary | ICD-10-CM

## 2013-08-06 NOTE — Assessment & Plan Note (Signed)
Report of mood remains the same although affect is better today.  Used MI techniques to get at how likely it will be for her to continue her gratitude journal.  Reading it gives me an idea of how she can focus her energy positively - it is a big contrast to how she often presents.  She was an 8 for importance and a 7 for confidence.  I suggested that this journal could lead to a benefit similar to what she might get from a medication if she committed to it.    Revisited the voc rehab option as a way to remind her of her choices.  Relationships are primary and challenging.  Looked at one opportunity to hopefully create a more positive dynamic here.  See patient instructions for further plan.

## 2013-08-06 NOTE — Patient Instructions (Signed)
Please schedule a follow-up for: August 17th at 11:00.  I hope to meet Stacy Campbell soon.  I want to hear some of her stories! Great job with the [employer].  You agreed to write in it at least four times a week.  When you are feeling bad or irritable - that might be a good time to write.  Even if it is hard, if you can shift your perspective, you might feel better.  You may also re-read some of your entries when you are feeling low - that might help as well. Another thing to consider - if you are feeling up to it - is regarding your relationships.  The next time someone comes and talks to you about what is going on in THEIR life, try not to make it about YOU.  Instead, ask the question, "What does this person need from me?"  This outward focus (helping others and getting out of yourself) will likely help you feel better.

## 2013-08-06 NOTE — Progress Notes (Signed)
Stacy Campbell presents for follow-up.  She has nothing for the agenda.  I reminded her of her homework and she handed over her gratitude journal which started on 7/24 and continues through last night (7/29).  Each day lists three gratitudes and in many cases a paragraph focusing on why she is grateful.  She reports it was hard to do (especially if she was feeling irritable or sad) and also reports that doing it shifted how she felt toward the better.  We determined that some times she didn't WANT to feel better.  Overall it was a helpful exercise for her and we discussed the potential to keep it going.  She spontaneously talked a lot about relationships today - with two of her sons (Stacy Campbell included) and her daughter Stacy Campbell.  Ran out of time to address the sleep issue but she reported that she had been watching the relaxation videos.  She did not find it all that helpful but I did not go into more depth.

## 2013-08-10 ENCOUNTER — Encounter: Payer: Self-pay | Admitting: Family Medicine

## 2013-08-10 ENCOUNTER — Ambulatory Visit (INDEPENDENT_AMBULATORY_CARE_PROVIDER_SITE_OTHER): Payer: Medicaid Other | Admitting: Family Medicine

## 2013-08-10 VITALS — BP 166/96 | HR 92 | Temp 98.9°F | Ht 65.5 in | Wt 237.0 lb

## 2013-08-10 DIAGNOSIS — L280 Lichen simplex chronicus: Secondary | ICD-10-CM

## 2013-08-10 DIAGNOSIS — L281 Prurigo nodularis: Secondary | ICD-10-CM

## 2013-08-10 DIAGNOSIS — B182 Chronic viral hepatitis C: Secondary | ICD-10-CM

## 2013-08-10 DIAGNOSIS — M79605 Pain in left leg: Secondary | ICD-10-CM

## 2013-08-10 DIAGNOSIS — M79609 Pain in unspecified limb: Secondary | ICD-10-CM

## 2013-08-10 NOTE — Assessment & Plan Note (Signed)
Now with calf pain. No signs of DVT.  Discussed potential [place] with attending Dr. Andria Frames today.  We both agreed to forgo this given lack of clinical findings. Etiology - MSK vs neuropathic pain. Advised stretching and continued cymbalta.

## 2013-08-10 NOTE — Assessment & Plan Note (Signed)
Referral placed for screening EGD.

## 2013-08-10 NOTE — Patient Instructions (Signed)
It was nice to see you today.  I have placed referral for GI and dermatology.  Regarding your calf pain, I feel that this is muscular in origin or from your back.  Continue supportive care and stretching.  Follow up annually or earlier if needed.

## 2013-08-10 NOTE — Progress Notes (Signed)
   Subjective:    Patient ID: Stacy Campbell, female    DOB: Mar 31, 1955, 58 y.o.   MRN: [id number]  HPI 58 year old female with a PMH of anxiety, depression, prurigo nodularis, Hep C (treated recently), and HTN who presents with the following concerns/complaints:  1) Screening EGD - Patient reports that per Hepatitis clinic she is in need of EGD and colonoscopy for varices screening.  - Patient needs referral for this today. - Of note, patient has had prior Colonoscopy in 2011.  At that time it was recommended that she have repeat colonoscopy in 10 years. Patient reports that she will not do this prior to 10 year mark.  Additionally, she desires to have this done by a physician/group other than [facility] (she was not pleased with her prior colonoscopy).  2) Left calf pain - Patient reports severe calf pain  - This has been going on for approximately 1-1.5 months. - No associated redness or swelling.  - She reports that this has been more noticeable since she has been more active recently.  Review of Systems Per HPI    Objective:   Physical Exam Filed Vitals:   08/10/13 1409  BP: 166/96  Pulse: 92  Temp:    Exam: General: well appearing, NAD. Extremities: Left calf - no erythema or swelling. Very sensitive to touch.  + Pain w/ dorsiflexion. Psych: very flat affect and depressed mood.    Assessment & Plan:  See Problem List

## 2013-08-12 ENCOUNTER — Other Ambulatory Visit: Payer: Self-pay | Admitting: Family Medicine

## 2013-08-12 DIAGNOSIS — R928 Other abnormal and inconclusive findings on diagnostic imaging of breast: Secondary | ICD-10-CM

## 2013-08-13 ENCOUNTER — Ambulatory Visit
Admission: RE | Admit: 2013-08-13 | Discharge: 2013-08-13 | Disposition: A | Discharge status: Home / Self Care | Payer: Medicaid Other | Source: Ambulatory Visit | Attending: Family Medicine | Admitting: Family Medicine

## 2013-08-13 DIAGNOSIS — R928 Other abnormal and inconclusive findings on diagnostic imaging of breast: Secondary | ICD-10-CM

## 2013-08-19 ENCOUNTER — Ambulatory Visit: Payer: Medicaid Other | Admitting: Psychology

## 2013-08-24 ENCOUNTER — Ambulatory Visit (INDEPENDENT_AMBULATORY_CARE_PROVIDER_SITE_OTHER): Payer: Medicaid Other | Admitting: Psychology

## 2013-08-24 DIAGNOSIS — F339 Major depressive disorder, recurrent, unspecified: Secondary | ICD-10-CM

## 2013-08-24 NOTE — Assessment & Plan Note (Addendum)
Affect is good today.  She smiles regularly and broadly and laughs appropriately.  She is not tearful today although she occasionally makes a plan to "not cry."  She states a belief that her grief is not healthy and is getting in the way of her overall mood and function.  She acknowledges that "doing more of the same" will likely mean she feels more of the same.  She states she is on board with tackling her grief in a more proactive way.  Specifically - she wants greater acceptance of her daughter's death.  The list of things that might help get her there include:  1. Visiting her daughter's [place] (she has never been in six years) 2. Honoring the anniversary of her death through a family get together and / or visiting her gravesite 3. Attending a grief support group (looked at possible benefits and drawbacks) 4. Writing a goodbye letter. 5. Creating a picture the memorializes her relationship with Irving Burton (something specific that she knows about and wants to do)  We discussed the importance of having a family meeting before she does anything to let her family know her goal and the plan to get there.  She is also to let them know that more crying is expected and that their acceptance of this would be useful.  They don't like to see her cry or get upset and usually give her a hard time about it.  Of note - she got confused about her [facility] appointment last week and missed it.  She wished to reschedule.  See patient instructions for further plan.

## 2013-08-24 NOTE — Progress Notes (Signed)
Stacy Campbell presents for follow-up.  She shared that she got irritable and acted poorly with Joycelyn Schmid and Joycelyn Schmid called her on it and set a firm limit.  We discussed the issues at play here and the benefit of having people around her that set appropriate limits.    Focus of the rest of the visit was on her grief.

## 2013-08-24 NOTE — Patient Instructions (Signed)
Please schedule a follow-up to see me on August 27th at 11:00.  Your [facility] Appointment is for September 2nd at 9:00. Homework is to look at the wolf story and see how it might relate to you and specifically how you relate to your children / loved ones. Big homework is to consider holding a family meeting and rolling out your plan to get to a healthier place.  See sheet I gave you.

## 2013-09-02 ENCOUNTER — Telehealth: Payer: Self-pay | Admitting: Family Medicine

## 2013-09-02 NOTE — Telephone Encounter (Signed)
Pt called and needs a refill on her Hydrocodone left up front for pick up. Please call when ready. jw °

## 2013-09-03 ENCOUNTER — Ambulatory Visit (INDEPENDENT_AMBULATORY_CARE_PROVIDER_SITE_OTHER): Payer: Medicaid Other | Admitting: Psychology

## 2013-09-03 DIAGNOSIS — F339 Major depressive disorder, recurrent, unspecified: Secondary | ICD-10-CM

## 2013-09-03 NOTE — Telephone Encounter (Signed)
Covering for Dr. Lacinda Axon while he is on vacation.   Patient will need an appointment with PCP to discus refill of controlled substance. If PCP deems appropriate at time of appt he can refill. Please inform pt.  Leeanne Rio, MD

## 2013-09-03 NOTE — Patient Instructions (Signed)
Please schedule a follow-up for: September 8th at 11:00.  Feel free to call me in between if you want to tell me how you did.  [phone].   You said you are ready to move forward with some steps regarding visiting the [facility] but are not yet ready to go there.  We outlined the following: 1.  Driving by the cemetery 2. Going in the driveway 3. Going half-way down the street inside the cemetery 4. Parking next to the [place] 5. Getting out and visiting the gravesite 6. Going back and taking something for her grave  These are big steps.  You thought it best to go with someone and you need a driver.  You said Bethel Born might be able to help and you agreed you would talk to her today.  You also agreed you would do step number 1 and 2 within the next four days (that is on or before Monday).  If you feel like moving further down the list - that is great.  It is also perfectly fine to sit and stay for awhile.  This is yours to do.

## 2013-09-03 NOTE — Telephone Encounter (Signed)
Relayed message,patient voiced understanding. Stacy Campbell S  

## 2013-09-03 NOTE — Assessment & Plan Note (Signed)
Pain is apparent.  She had a really tough time getting up from the waiting room chair.  I though that coupled with her fatigue would make a meeting today difficulty however she did very well.  She participated fully and laughed on occasion.   Reassessed where she is in her grieving and what she would like to do.  She thinks she needs to visit the [facility].  Looked at whether breaking this down into smaller, more manageable steps might be helpful and she agreed.  See patient instructions for the hierarchy we created.  We got very specific with the plan which will hopefully help with success.

## 2013-09-03 NOTE — Progress Notes (Signed)
[place] presented for follow-up.  She has been in increased pain since Monday of this week.  She thinks it starts in her back but the pain in her knees is what is really bothering her.  She reports she hasn't slept much in two days secondary to the pain.  Discussed.  Bulk of our meeting focused on her homework.  We reviewed the Tale of Two Wolves I gave her and brainstormed ways she could choose to feed the wolf that lives in joy, hope, kindness, bravery, and faith.    She shared the game plan she developed last session with Joycelyn Schmid and stated that Joycelyn Schmid said "Frankey Shown got to do this" specifically, visit the [place].  [place] reports she is not ready.  Discussed.

## 2013-09-08 ENCOUNTER — Telehealth: Payer: Self-pay | Admitting: Family Medicine

## 2013-09-08 NOTE — Telephone Encounter (Signed)
Pt called and would like a refill on her pain medication. jw °

## 2013-09-09 ENCOUNTER — Ambulatory Visit (INDEPENDENT_AMBULATORY_CARE_PROVIDER_SITE_OTHER): Payer: Self-pay | Admitting: Psychology

## 2013-09-09 DIAGNOSIS — F339 Major depressive disorder, recurrent, unspecified: Secondary | ICD-10-CM

## 2013-09-09 DIAGNOSIS — F411 Generalized anxiety disorder: Secondary | ICD-10-CM

## 2013-09-09 MED ORDER — HYDROCODONE-ACETAMINOPHEN 5-325 MG PO TABS: 1.0000 | ORAL_TABLET | Freq: Two times a day (BID) | ORAL | Status: DC | PRN

## 2013-09-09 NOTE — Patient Instructions (Signed)
Please schedule a follow-up for [place] for:  10/7 at 9:30.   I will see you 9/8 at 11:00. Dr. Tammi Klippel recommended that you switch from Cymbalta to Zoloft.  For one week you will take 50 mg (1/2 a pill) of Zoloft and 30 mg (one pill) of Cymbalta.  Second week you are going to take 100 mg of Zoloft (1 pill) and stop the Cymbalta all together. We will talk more at your follow-up with me and may make some changes prior to your follow-up in [place].

## 2013-09-09 NOTE — Progress Notes (Signed)
Stacy Campbell presented for her initial [place] appointment.  Focus of the visit was on clarifying the diagnosis and looking at pharmacologic options.    Presenting Issue:  Well documented in prior notes by Dr. Gwenlyn Saran.  Meets criteria for depression.  Has complicated grief.  Also has anxiety.  Major stressors are raising her grandchildren, financial issues, and the death of her daughter.    Report of symptoms:  Lack of motivation, sadness, irritability, poor appetite, feelings of guilt, sleep disturbance.  Age of onset of first mood disturbance:  Says she did well until her late 87's when two significant stressors increased her anxiety. Duration of CURRENT symptoms:  Significant deterioration in symptoms after her daughter died 6 years ago.    Impact on function:  Significant.  Chases family members away.  Doesn't feel like doing things that she used to do [employer], socializing).   Family history of psychiatric issues:  Denies issues in parents.  One of four brothers has alcohol issues.    Medical conditions that might explain or contribute to symptoms:  Has chronic pain.    PHQ-9:  21 - extremely difficult.  Question 9 = 0. GAD7:  20 (out of a possible 21).

## 2013-09-09 NOTE — Assessment & Plan Note (Signed)
GAD indicates a significant amount of anxiety in the last two weeks.  She appeared more sad to [place] today and assessment was geared toward that.  See depression problem.

## 2013-09-09 NOTE — Telephone Encounter (Signed)
Rx up front

## 2013-09-09 NOTE — Assessment & Plan Note (Signed)
Mood is reported as sad.  Affect is consistent.  She was significantly tearful at one point today but also was able to laugh on occasion.  Discussed treatment options.  Cymbalta was likely started for both mood and pain.  Patient reports she does not think it has been particularly helpful for either one.  She states she was previously on 100 mg of Zoloft and though the Zoloft was more effective.  She was interested in switching back.  Dr. Tammi Klippel thinks pushing the dose of Zoloft to 150 or 200 might be helpful.  In addition, he suspects and adjunctive agent like Abilify might be useful.  Next follow-up available for [place] is not until October.  Patient will see me in the meantime and her PCP.  Will consult with Dr. Tammi Klippel as needed to continue moving treatment along.  In one week, she should be off the Cymbalta and at 100 mg of Zoloft.  After one week at 100 mg, will consider titrating up.  She will continue working on her grief - something she continues to cite as central to feeling better.    See patient instructions for further plan.

## 2013-09-15 ENCOUNTER — Ambulatory Visit (INDEPENDENT_AMBULATORY_CARE_PROVIDER_SITE_OTHER): Payer: Self-pay | Admitting: Psychology

## 2013-09-15 DIAGNOSIS — F339 Major depressive disorder, recurrent, unspecified: Secondary | ICD-10-CM

## 2013-09-15 NOTE — Assessment & Plan Note (Addendum)
Continue current medication regimen as outlined in [place].  No problems noted.  No safety issues identified.  Follow as scheduled.   Continued grief work.  AVS was printed from visit where we outlined plan.  Stacy Campbell plans to help her mom enact the plan.  Discussed expectations and what Stacy Campbell needs while doing this tough work.  Discussed warning signs that stress may be negatively affecting her health (chest pain, SOB).  Crying and sadness is to be expected however.  Patient and her mom seemed in agreement and ready.  Will follow in two weeks or as needed.  See patient instructions for further plan.

## 2013-09-15 NOTE — Patient Instructions (Signed)
So glad Joycelyn Schmid was able to come in with you today.  It was a big help and it was great meeting her.  I apologize again for keeping you waiting. Let's schedule a follow-up for: 9/24 at 9:00. Good luck with your homework.  Feel free to call if you have questions / concerns.  [phone].

## 2013-09-15 NOTE — Progress Notes (Signed)
Stacy Campbell presented for follow-up.  Her daughter, Stacy Campbell, is with her.  Neither have any specific items for the agenda.  My agenda items including medication update, review of homework, and getting some input from [place].  Stacy Campbell reports that she is taking 30 mg of Cymbalta and 50 mg of Zoloft.  She reports continued nausea - no worse nor better since making this shift.  No other difficulties tolerating the medicine.  Has not noticed any shift in pain (better or worse), mood, or energy.  She reports she is planning to stop the Cymbalta all together and increase the Zoloft to 100 mg.  Stacy Campbell reports she has not done her homework.  She cites [place] as the limiting factor.  Stacy Campbell, who is with Stacy Campbell today states that she will take responsibility for helping her mom with her homework.  She thinks it is important for her mom to undertake this and [name] continues to believe it is important.  Rest of the meeting was spent checking in with Stacy Campbell around her mom.  She states that her mom was always "mean."  Since [place] died, she reports her mother has had increased irritability and sadness and decreased motivation to get out of the house.  She says her mom "loves" family time and cooking but gripes about both for awhile before she recognizes she is having fun.  Stacy Campbell pushes her in ways that her siblings will not and states she knows how to "handle her."  Her mom's friend Stacy Campbell is much the same way.

## 2013-09-29 ENCOUNTER — Other Ambulatory Visit: Payer: Self-pay | Admitting: Nurse Practitioner

## 2013-09-29 DIAGNOSIS — C220 Liver cell carcinoma: Secondary | ICD-10-CM

## 2013-10-01 ENCOUNTER — Ambulatory Visit (INDEPENDENT_AMBULATORY_CARE_PROVIDER_SITE_OTHER): Payer: Medicaid Other | Admitting: Psychology

## 2013-10-01 DIAGNOSIS — F339 Major depressive disorder, recurrent, unspecified: Secondary | ICD-10-CM

## 2013-10-01 NOTE — Patient Instructions (Addendum)
Please schedule a follow-up for: 10/6 at 1:30.   You agreed that it is important to continue the work you have started to get more acceptance of Lisha's death.  The last time you were there, you shut down your emotions which was coping - and exactly what you needed to do in that moment.  It is not where you need to end up though - so further work is necessary. When you go out again, please consider taking a folding chair and a notepad and pen.  Consider writing while you are there - anything really will work including what you see, what you are thinking, or a letter / note to [place].  This should help unlock your feelings. When you start to feel things remember this: Deep breathing will help:  Smell the flowers; blow out the candles.  Practice this ahead of time. A few statements that you can recite will help:  This is part of accepting Lisha's death.  This will not destroy me.  I can do this. Showing this to Joycelyn Schmid might help you get it done.    One more thing - I am going to talk with Dr. Tammi Klippel and see what the next step is regarding your medications.  I will call you with that info.  If you have not heard from me by the beginning of next week - CALL ME!!  [phone].

## 2013-10-01 NOTE — Assessment & Plan Note (Addendum)
Report of mood remains sad and irritable.  Affect is within normal limits.  Not tearful today.  Able to laugh.  No evidence of SI. Thoughts are clear and goal directed.  Discussed next steps with Dr. Tammi Klippel and he stated she should advance Zoloft to 150 mg and we would discuss adding abilify at her follow-up [place].  I called [place] with that recommendation.  With regards to grief work, I think her experience was classic coping by shutting down emotionally.  She was able to do that for a short time and then used food as a way to further numb her emotional response.  She is certain that she needs to have better access to her feelings but is scared at the thought of this.  Discussed.  Reviewed coping including diaphragmatic breathing and cognitive strategies.  The anniversary of Lisha's death is 10/15/2022.  She has gotten a lot of support from her daughter and others for the step she recently took.  It has been 6 years and she has never been to the [place].    See patient instructions for further plan.

## 2013-10-01 NOTE — Progress Notes (Signed)
Stacy Campbell presented for follow-up.  She is off the Cymbalta completely and is up to 100 mg of Zoloft.  She thinks her nausea is slightly better.  No change in pain and no change in mood or function.  She has a follow-up for [place] on 10/7 but would like to take the next step in medication before then if possible.  The rest of the meeting focused on her continued grief work.  She brought in cell phone pictures of her at [facility].  She noted that she felt "numb" while on the way there and while there.  Toward the end, she got this panicky feeling and needed to leave quick.  On the way home, she stopped to get a bag of chocolate chip cookies.  Discussed this and next steps.

## 2013-10-08 ENCOUNTER — Ambulatory Visit (INDEPENDENT_AMBULATORY_CARE_PROVIDER_SITE_OTHER): Payer: Medicaid Other | Admitting: Family Medicine

## 2013-10-08 ENCOUNTER — Encounter: Payer: Self-pay | Admitting: Family Medicine

## 2013-10-08 VITALS — BP 169/94 | HR 93 | Temp 98.4°F | Ht 65.5 in | Wt 233.0 lb

## 2013-10-08 DIAGNOSIS — M25562 Pain in left knee: Secondary | ICD-10-CM

## 2013-10-08 DIAGNOSIS — J449 Chronic obstructive pulmonary disease, unspecified: Secondary | ICD-10-CM

## 2013-10-08 DIAGNOSIS — M25561 Pain in right knee: Secondary | ICD-10-CM

## 2013-10-08 DIAGNOSIS — Z23 Encounter for immunization: Secondary | ICD-10-CM

## 2013-10-08 MED ORDER — MELOXICAM 15 MG PO TABS: 15.0000 mg | ORAL_TABLET | Freq: Every day | ORAL | Status: DC

## 2013-10-08 MED ORDER — PREDNISONE 50 MG PO TABS: 50.0000 mg | ORAL_TABLET | Freq: Every day | ORAL | Status: DC

## 2013-10-08 NOTE — Assessment & Plan Note (Signed)
No current signs of PNA. Significant wheezing on exam with mild increased WOB. Will treat with Prednisone 50 mg x 5 days.

## 2013-10-08 NOTE — Assessment & Plan Note (Signed)
Likely OA given physical exam findings (+ crepitus). Sending for standing xray. Offered injection, but patient would like to try conservative approach first.  Will try Mobic x 2 weeks (brief given HTN).  Follow up in 2-3 weeks for reassessment and possible injection.

## 2013-10-08 NOTE — Patient Instructions (Signed)
It was nice to see you today.  Below is what we talked about:  1) Wheezing. - Continue taking your inhalers. - Take the prednisone x 5 days.  2) Knee pain - Likely from arthritis. - Please go get your xray at your earliest convenience - Try the Mobic after completing your prednisone.  Follow up in ~ 2 weeks.  Take care  FYI - We need to get your pap smear done soon!

## 2013-10-08 NOTE — Progress Notes (Signed)
   Subjective:    Patient ID: Stacy Campbell, female    DOB: 1955/12/19, 58 y.o.   MRN: [id number]  HPI 58 year old with a complex PMH including chronic low back pain, COPD/Asthma who presents with complaints of knee pain.  1) Knee Pain  - Pain is bilateral. - Moderate in severity. - No recent fall, trauma, injury. - Pain is diffuse (no focality). - Worse with physical activity/standing. - Patient does have a long standing history of low back pain with occasional radicular symptoms.  She states that this pain differs from her back pain.  - She reports associated weakness.  2) Wheezing - Patient noted to be wheezing upon entering the room. - Patient reports that this has been present for a few days. - She has been taking her inhalers as prescribed.  She has been using her Albuterol, 2 x daily. - No recent fever, chills.   Review of Systems Per HPI    Objective:   Physical Exam Filed Vitals:   10/08/13 0849  BP: 169/94  Pulse: 93  Temp: 98.4 F (36.9 C)  Exam: General: well appearing female, audible wheezing noted.  NAD. Cardiovascular: RRR. No murmurs, rubs, or gallops. Respiratory: Diffuse wheezing noted on exam.   MSK - Knee: Bilateral Normal to inspection with no erythema or effusion or obvious bony abnormalities. Palpation normal with no warmth, joint line tenderness, patellar tenderness, or condyle tenderness. ROM full in flexion and extension Ligaments with solid consistent endpoints including ACL, PCL, LCL, MCL. Negative Mcmurray's Patellar glide with + Crepitus    Assessment & Plan:  See Problem List

## 2013-10-09 ENCOUNTER — Ambulatory Visit
Admission: RE | Admit: 2013-10-09 | Discharge: 2013-10-09 | Disposition: A | Discharge status: Home / Self Care | Payer: Medicaid Other | Source: Ambulatory Visit | Attending: Nurse Practitioner | Admitting: Nurse Practitioner

## 2013-10-09 DIAGNOSIS — C220 Liver cell carcinoma: Secondary | ICD-10-CM

## 2013-10-13 ENCOUNTER — Ambulatory Visit (INDEPENDENT_AMBULATORY_CARE_PROVIDER_SITE_OTHER): Payer: Medicaid Other | Admitting: Psychology

## 2013-10-13 DIAGNOSIS — F329 Major depressive disorder, single episode, unspecified: Secondary | ICD-10-CM

## 2013-10-13 DIAGNOSIS — F32A Depression, unspecified: Secondary | ICD-10-CM

## 2013-10-13 NOTE — Patient Instructions (Addendum)
I will see you in [facility] tomorrow to follow up on your medicines.  That appointment is at 9:30. Please schedule a follow-up to see me on 10/22/13 at 9:00. We continued to talk about your grief today.  The rain was a major barrier to going to [employer].  It was not the only one however.  You said you want to go and at the same time, you don't want to go.  That is understandable.  The bottom line:  You NEED to go.  This is what you keep telling me. Get Joycelyn Schmid and have her help you make this happen.  You said you had your sheet from last visit that listed the coping.   Call me with questions or concerns:  [phone].  I know you can do this.

## 2013-10-13 NOTE — Assessment & Plan Note (Signed)
No report on mood.  Affect is tearful but within normal limits given grief work she is doing.  Pain seems slightly better.  She put forth a lot of effort in this visit today.    See patient instructions for further plan.

## 2013-10-13 NOTE — Progress Notes (Addendum)
Stacy Campbell presents for follow-up.  She has not followed up on her grief work because of the rain and also because of ambivalence.  She reports she gets "excited" about going and then decides she doesn't want to.  The main reason not to go is because it is a clear indicator that Deberah Castle has died.  Even though she knows that in her head, the denial is strong and the gravesite marker breaks through that denial.  The reason to go is because she knows it is important.  She also likes writing and thinks that would be a good exercise.  Finally, we discussed the "container" the [employer] puts around her grief.  If she goes regularly enough, it can be her "scheduled grieving time" and she may be more apt to focus on other things afterward.  Other focus of our meeting was on her guilt for not being able to help Lisha when she asked for it.  This was around pain a couple of weeks before she died.  Discussed the role of being present in the face of someone's suffering and the difficulty of staying there when "fixing" is no longer an option.  Also discussed other ways she helped Lisha including agreeing to care for her children.

## 2013-10-14 ENCOUNTER — Ambulatory Visit (INDEPENDENT_AMBULATORY_CARE_PROVIDER_SITE_OTHER): Payer: Medicaid Other | Admitting: Psychology

## 2013-10-14 DIAGNOSIS — F329 Major depressive disorder, single episode, unspecified: Secondary | ICD-10-CM

## 2013-10-14 DIAGNOSIS — F32A Depression, unspecified: Secondary | ICD-10-CM

## 2013-10-14 NOTE — Assessment & Plan Note (Addendum)
Report of mood is depressed.  Affect is within normal limits.  Her connection both here and [facility] appears to be good and this to me, is a potential sign of improvement.  Also, the work she is doing around grief takes energy and motivation.  It is slow going but she is showing up to do the work.    With regards to functional improvement, discussed the idea of the medicine opening doors for her AND she needs to choose to walk through.  She is doing a nice job with her therapy.  Treatment team and patient decided it was reasonable to move forward with the plan to add Abilify as an augmentation to her antidepressant.  Will start at 2.5 mg for one week and then increase to 5 mg.  AE discussed.  See patient instructions for further plan.

## 2013-10-14 NOTE — Progress Notes (Signed)
Stacy Campbell presents for [place] follow-up.  She is taking 150 mg of Zoloft.  Reports no benefit that she or others have noticed.  She reports a "headache" with the medicine that she goes on to describe is not really a pain but more of a sensation.  She has some "swimmy" headedness with this and occasionally feels like the room is turning.  She is not interested in stopping the medicine because of these symptoms and she does not think they interfere with her function.  Of note - she no longer is experiencing nausea.  In the last two weeks she reports bothersome depressed mood about half the days.  If her mood were improved she expects she would involve herself more in family activities and complete more tasks around the house.

## 2013-10-14 NOTE — Patient Instructions (Signed)
It was good to see you again today Stacy Campbell.  Please schedule a follow-up for [facility] for 11/4 at 9:30. We discussed adding Abilify to your Zoloft in order to help you better manage your mood.  Please take a 1/2 table for one week and then a full tablet.  Take the medicine in the evening (at least to begin with).  The most common side effects are restlessness or a feeling like you need to move.  If you feel this way, let us know.  The other common side effect is headache.  If you notice that you start getting a headache, let us know.

## 2013-10-16 ENCOUNTER — Ambulatory Visit
Admission: RE | Admit: 2013-10-16 | Discharge: 2013-10-16 | Disposition: A | Discharge status: Home / Self Care | Payer: Medicaid Other | Source: Ambulatory Visit | Attending: Family Medicine | Admitting: Family Medicine

## 2013-10-16 ENCOUNTER — Other Ambulatory Visit: Payer: Self-pay | Admitting: Family Medicine

## 2013-10-16 DIAGNOSIS — M25562 Pain in left knee: Principal | ICD-10-CM

## 2013-10-16 DIAGNOSIS — M25561 Pain in right knee: Secondary | ICD-10-CM

## 2013-10-22 ENCOUNTER — Ambulatory Visit (INDEPENDENT_AMBULATORY_CARE_PROVIDER_SITE_OTHER): Payer: Medicaid Other | Admitting: Psychology

## 2013-10-22 DIAGNOSIS — F329 Major depressive disorder, single episode, unspecified: Secondary | ICD-10-CM

## 2013-10-22 DIAGNOSIS — F32A Depression, unspecified: Secondary | ICD-10-CM

## 2013-10-22 NOTE — Assessment & Plan Note (Addendum)
No report on mood.  Affect is within normal limits.  She does not appear appreciably better than the last few sessions however her report that she noticed a shift is encouraging.  Also - her feeling down when the trip to the cemetery got canceled is really encouraging.    Based on Stacy Campbell's report today, I feel obligated to make a call to [employer].  I will make a note in Stacy Campbell's chart and forward it to Dr. Lacinda Axon.
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
## 2013-10-22 NOTE — Patient Instructions (Signed)
Please schedule a follow-up for:  October 29 at 9:00.   I hope you have a meaningful trip to the [employer].  If you start to feel overwhelmed, remember to draw on your coping strategies. With regards to medicine, start one pill of the Abilify tomorrow.  Hopefully the motivation you sensed this past week is related to medication effects.

## 2013-10-29 ENCOUNTER — Other Ambulatory Visit (HOSPITAL_COMMUNITY)
Admission: RE | Admit: 2013-10-29 | Discharge: 2013-10-29 | Disposition: A | Discharge status: Home / Self Care | Payer: Medicaid Other | Source: Ambulatory Visit | Attending: Family Medicine | Admitting: Family Medicine

## 2013-10-29 ENCOUNTER — Encounter: Payer: Self-pay | Admitting: Family Medicine

## 2013-10-29 ENCOUNTER — Ambulatory Visit (INDEPENDENT_AMBULATORY_CARE_PROVIDER_SITE_OTHER): Payer: Medicaid Other | Admitting: Family Medicine

## 2013-10-29 VITALS — BP 189/88 | HR 103 | Temp 98.2°F | Ht 66.0 in | Wt 239.2 lb

## 2013-10-29 DIAGNOSIS — Z01419 Encounter for gynecological examination (general) (routine) without abnormal findings: Secondary | ICD-10-CM | POA: Diagnosis present

## 2013-10-29 DIAGNOSIS — Z0000 Encounter for general adult medical examination without abnormal findings: Secondary | ICD-10-CM

## 2013-10-29 DIAGNOSIS — Z1151 Encounter for screening for human papillomavirus (HPV): Secondary | ICD-10-CM | POA: Insufficient documentation

## 2013-10-29 DIAGNOSIS — Z124 Encounter for screening for malignant neoplasm of cervix: Secondary | ICD-10-CM

## 2013-10-29 NOTE — Patient Instructions (Signed)
It was nice to see you today.  I will send you the results of your pap smear.  If it is positive you'll receive a call from me.  Follow up with me regarding your knee pain.  I would be happy to inject your knees.  Take care  Dr. Lacinda Axon

## 2013-10-29 NOTE — Progress Notes (Signed)
   Subjective:    Patient ID: Stacy Campbell, female    DOB: 1955-07-19, 58 y.o.   MRN: [id number]  HPI 58 year old female with depression, COPD/asthma, hypertension and chronic back pain presents for gynecological examination.  1) Gyn Exam  Patient due for Pap smear  Last recorded one was in 2006 and was normal.  No recent vaginal bleeding or discharge.  No current gynecological issues.  2) Depression  Patient being followed by mood clinic. She is now on Abilify and Zoloft.  I have reviewed the notes and patient seems to be doing well regarding this.  She is making some strides in her personal life and her mood is slowly improving.  She is happy with Dr. Gwenlyn Saran and enjoys seeing her.   Review of Systems Per HPI    Objective:   Physical Exam Filed Vitals:   10/29/13 0854  BP: 189/88  Pulse: 103  Temp: 98.2 F (36.8 C)   Exam: General: well appearing, NAD.  Pelvic Exam:        External: normal female genitalia without lesions or masses        Vagina: normal without lesions or masses        Cervix: friable cervix, otherwise normal.         Pap smear: performed Psych: smiling and interactive today (improved mood and affect from prior).    Assessment & Plan:  See Problem List

## 2013-10-29 NOTE — Assessment & Plan Note (Signed)
Pap smear done today

## 2013-10-30 ENCOUNTER — Other Ambulatory Visit: Payer: Self-pay | Admitting: *Deleted

## 2013-10-30 LAB — CYTOLOGY - PAP

## 2013-10-30 MED ORDER — BENZONATATE 100 MG PO CAPS: 100.0000 mg | ORAL_CAPSULE | ORAL | Status: DC | PRN

## 2013-11-01 ENCOUNTER — Encounter: Payer: Self-pay | Admitting: Family Medicine

## 2013-11-05 ENCOUNTER — Ambulatory Visit (INDEPENDENT_AMBULATORY_CARE_PROVIDER_SITE_OTHER): Payer: Medicaid Other | Admitting: Psychology

## 2013-11-05 DIAGNOSIS — F329 Major depressive disorder, single episode, unspecified: Secondary | ICD-10-CM

## 2013-11-05 DIAGNOSIS — F32A Depression, unspecified: Secondary | ICD-10-CM

## 2013-11-05 NOTE — Assessment & Plan Note (Signed)
Did not report on mood.  Affect was within normal limits.  She did seem a bit brighter today than usual.  She is wanting to take a break from going to the [place].  She wants to wait until she feels pulled to go again and thinks it might be some time in December.  When asked her next step, she said to continue to accept Lisha's death and her heart (because in her head she knows she is gone).  I think this is tied to the forgiveness thing and Nance agrees further work is needed here.  She realizes she has taken some big steps and is proud of herself in this regard.  Continuing to talk about Lisha's death and how it is impacting her and her family is part of the ongoing work.  She will follow in [place] next week and with me the following week.
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
## 2013-11-09 ENCOUNTER — Telehealth: Payer: Self-pay | Admitting: Family Medicine

## 2013-11-09 DIAGNOSIS — L281 Prurigo nodularis: Secondary | ICD-10-CM

## 2013-11-09 NOTE — Telephone Encounter (Signed)
Pt is requesting a referral to Dr. Ledell Peoples  office for a dermatology visit.  Patient is estableished with their practice,but they won't see her without a referral because she's medicaid

## 2013-11-10 NOTE — Telephone Encounter (Signed)
Referral placed.

## 2013-11-11 ENCOUNTER — Ambulatory Visit (INDEPENDENT_AMBULATORY_CARE_PROVIDER_SITE_OTHER): Payer: Medicaid Other | Admitting: Psychology

## 2013-11-11 VITALS — BP 160/80 | Wt 237.0 lb

## 2013-11-11 DIAGNOSIS — F329 Major depressive disorder, single episode, unspecified: Secondary | ICD-10-CM

## 2013-11-11 DIAGNOSIS — F32A Depression, unspecified: Secondary | ICD-10-CM

## 2013-11-11 NOTE — Assessment & Plan Note (Signed)
Mood is euthymic with an appropriate affect.  Appears like definite improvement with current treatment.  Question escalating the dose to determine if we can get better response.  Given it has only been two weeks, Dr. Tammi Klippel thought it best to wait another four to see what happens.  Pushing the dose has the potential to create side effects and right now, she is tolerating the medicine fine.  Discussed need for intentional behaviors that push her toward wellness.  In individual therapy we will address a possible grief support group as well as enhancing motivation to get out of the house more.  See patient instructions for further plan.

## 2013-11-11 NOTE — Progress Notes (Signed)
Stacy Campbell presents for follow-up in [place].  She is about two weeks on Abilify 5 mg with Zoloft 250 mg on board as well.  She reports no significant side effects.  Somnolence, headache, and restlessness were specifically asked.  She notices benefit and those around her do as well.  She has more energy and is more motivated.  This has translated to cleaning, laundry, and cooking.  She is still struggling with social situations - mainly getting out of the house on 1:1 experiences.  She gets annoyed or "over it" in about five minutes and wants to go home.  She thinks going out is important because it keeps her from getting into a pity mode at home.    In the last two weeks she has had two days where she experiences bothersome sad mood along with a lack of motivation.  This lasted most of the day.

## 2013-11-11 NOTE — Patient Instructions (Addendum)
Please schedule a follow-up for:  December 2nd at 9:30. We discussed your medication and your progress in general.  We are so happy that you are feeling better.  As we mentioned, the medication can open some doors - you need to decide to walk through them.  This won't be easy.  You may not be successful every time and that is okay.  It is important to push / challenge yourself because that is where the growth will happen.  Strong work Administrator, sports. No change in medication for now.

## 2013-11-19 ENCOUNTER — Other Ambulatory Visit: Payer: Self-pay | Admitting: Family Medicine

## 2013-11-19 NOTE — Telephone Encounter (Signed)
Refill request for Tramadol 

## 2013-11-20 ENCOUNTER — Ambulatory Visit (INDEPENDENT_AMBULATORY_CARE_PROVIDER_SITE_OTHER): Payer: Medicaid Other | Admitting: Psychology

## 2013-11-20 DIAGNOSIS — F329 Major depressive disorder, single episode, unspecified: Secondary | ICD-10-CM

## 2013-11-20 DIAGNOSIS — F32A Depression, unspecified: Secondary | ICD-10-CM

## 2013-11-20 MED ORDER — TRAMADOL HCL 50 MG PO TABS: 50.0000 mg | ORAL_TABLET | Freq: Three times a day (TID) | ORAL | Status: DC | PRN
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
## 2013-11-20 NOTE — Assessment & Plan Note (Signed)
Mood is reported as low / down and tied to  Specific situation.  Outside of this, mood is reported as less depressed.  The lack of negativity and the motivation she appears to have to cook what sounds like an enormous holiday meal appears to be a reasonable sign of functional improvement.  Discussed attempts to compartmentalize the situation with [facility] as much as possible; doing what she can do and then trying to get some space to focus on herself.  This includes pushing herself to go out more, say "yes" more, and respond less negatively to people in general.    She is working with a Loss adjuster, chartered with [employer].  She has an appointment there today.  She plans to talk to her about some of the things we discussed today.  Will follow after Thanksgiving.  Would like to see her sooner but do not have room.  She is to call if things start to deteriorate.  She has an [place] follow-up in between.

## 2013-11-23 NOTE — Telephone Encounter (Signed)
Rx refilled and is up front.

## 2013-11-23 NOTE — Telephone Encounter (Signed)
Spoke with patient and informed her that rx is up front for pick up

## 2013-12-04 ENCOUNTER — Telehealth: Payer: Self-pay | Admitting: Family Medicine

## 2013-12-04 NOTE — Telephone Encounter (Signed)
After hours line.   Pt called stating that she is out of refills for ambien. I explained we cant refill controlled substances over this emergency line.   I will notify her PCP vis phone note.   Laroy Apple, MD [facility] Resident, PGY-3 12/04/2013, 10:23 AM

## 2013-12-07 ENCOUNTER — Other Ambulatory Visit: Payer: Self-pay | Admitting: Family Medicine

## 2013-12-07 MED ORDER — ZOLPIDEM TARTRATE 10 MG PO TABS: 10.0000 mg | ORAL_TABLET | Freq: Every evening | ORAL | Status: DC | PRN

## 2013-12-07 NOTE — Telephone Encounter (Signed)
Pt called and wanted to know when her prescription for her Ambien will be put up front. Please call when ready. jw

## 2013-12-09 ENCOUNTER — Ambulatory Visit (INDEPENDENT_AMBULATORY_CARE_PROVIDER_SITE_OTHER): Payer: Medicaid Other | Admitting: Psychology

## 2013-12-09 DIAGNOSIS — F32A Depression, unspecified: Secondary | ICD-10-CM

## 2013-12-09 DIAGNOSIS — F329 Major depressive disorder, single episode, unspecified: Secondary | ICD-10-CM

## 2013-12-09 NOTE — Patient Instructions (Signed)
Please schedule a follow-up for:  December 9th at 11:00. We are so sorry you are carrying so much weight with this.  I do think that you were feeling better with the medicine changes and the work we were doing around your grief.  I hope you get the opportunity to continue this work.   If it would help for me to talk to [place], please let her know.  You will need to sign a release of information and you can do that on her end.
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
## 2013-12-09 NOTE — Assessment & Plan Note (Signed)
Report of mood is depressed and frustrated.  Stacy Campbell has an appointment with her therapist Friday.  She apparently has been diagnosed with ODD.  The dynamic between the two of them is very unhealthy. Despite my best efforts in therapy with Stacy Campbell, I have been unable to help her create a significant shift in the way that she interacts to make any significant improvement in the relationship.  Presently, both are doing poorly.  Dr. Tammi Klippel and I agree that finding an alternative living situation for Stacy Campbell, at least in the short term, has the potential for Stacy Campbell to make gains in her mental health.  See patient instructions for further plan.

## 2013-12-14 ENCOUNTER — Other Ambulatory Visit: Payer: Self-pay | Admitting: *Deleted

## 2013-12-14 MED ORDER — BENZONATATE 100 MG PO CAPS: 100.0000 mg | ORAL_CAPSULE | ORAL | Status: DC | PRN

## 2013-12-16 ENCOUNTER — Ambulatory Visit: Payer: Medicaid Other | Admitting: Psychology

## 2013-12-22 ENCOUNTER — Other Ambulatory Visit: Payer: Self-pay | Admitting: *Deleted

## 2013-12-22 MED ORDER — ALPRAZOLAM 1 MG PO TABS: ORAL_TABLET | ORAL | Status: DC

## 2013-12-23 ENCOUNTER — Other Ambulatory Visit: Payer: Self-pay | Admitting: Gastroenterology

## 2013-12-23 DIAGNOSIS — K7460 Unspecified cirrhosis of liver: Secondary | ICD-10-CM

## 2013-12-24 ENCOUNTER — Ambulatory Visit (INDEPENDENT_AMBULATORY_CARE_PROVIDER_SITE_OTHER): Payer: Medicaid Other | Admitting: Psychology

## 2013-12-24 ENCOUNTER — Ambulatory Visit (INDEPENDENT_AMBULATORY_CARE_PROVIDER_SITE_OTHER): Payer: Medicaid Other | Admitting: Family Medicine

## 2013-12-24 ENCOUNTER — Encounter: Payer: Self-pay | Admitting: Family Medicine

## 2013-12-24 DIAGNOSIS — M545 Low back pain, unspecified: Secondary | ICD-10-CM

## 2013-12-24 DIAGNOSIS — F329 Major depressive disorder, single episode, unspecified: Secondary | ICD-10-CM

## 2013-12-24 DIAGNOSIS — G8929 Other chronic pain: Secondary | ICD-10-CM

## 2013-12-24 DIAGNOSIS — G4700 Insomnia, unspecified: Secondary | ICD-10-CM

## 2013-12-24 DIAGNOSIS — F32A Depression, unspecified: Secondary | ICD-10-CM

## 2013-12-24 MED ORDER — OXYCODONE-ACETAMINOPHEN 5-325 MG PO TABS: 1.0000 | ORAL_TABLET | Freq: Three times a day (TID) | ORAL | Status: DC | PRN

## 2013-12-24 MED ORDER — ZOLPIDEM TARTRATE 10 MG PO TABS: 10.0000 mg | ORAL_TABLET | Freq: Every evening | ORAL | Status: DC | PRN

## 2013-12-24 NOTE — Patient Instructions (Signed)
It was nice to see you today.  I am giving you some pain medication today for your back.  I am also putting in a referral to pain management (our office or theirs will be in contact).  Take care and have a wonderful holiday.  Dr. Lacinda Axon

## 2013-12-24 NOTE — Assessment & Plan Note (Signed)
I was hopeful that Claude might be able to get a break from the stress of living with [facility]  I am not sure what the [employer] program might provide.  In the mean time, I am heartened to hear that [place] is getting out of the house for church.  She sounds like she has some positive energy in a couple areas of her life despite the continued difficulty with [place].  Mood is reported as "okay" today.  Affect appears bright.    Will follow in the new year.  Will need to revisit grief work eventually and see what goals she wants to set for herself.  I wonder if her brother living with her has lightened some of the financial burden.

## 2013-12-24 NOTE — Assessment & Plan Note (Deleted)
On Chronic Ambien 10 mg QHS.

## 2013-12-24 NOTE — Assessment & Plan Note (Signed)
Ambien refilled

## 2013-12-24 NOTE — Assessment & Plan Note (Signed)
Patient with acute flare of chronic low back pain. We have tried several modalities without significant relief.  I prescribed opiates in the past which have helped modestly.  She states that the pain medication allows her to care for her grandchildren and carry out her normal activities.    We discussed treatment options at length today.  I discussed potential harms of opiate therapy especially in the setting of chronic Ambien use as well as chronic benzo use. Patient understands the risks and is not adamant about chronic opiate therapy. However she is in significant pain and would like short-term treatment.  Rx given for Percocet today (5-325 mg #30).  Given risks of opiate therapy (and my concerns listed above), patient and I are in agreement for pain management referral.
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
## 2013-12-24 NOTE — Progress Notes (Signed)
   Subjective:    Patient ID: Stacy Campbell, female    DOB: [birth year], 58 y.o.   MRN: [id number]  HPI 58 year old female with a history of chronic low back pain presents for an acute visit with complaints of worsening back pain.  1) Back pain  As noted from her prior encounters, patient has chronic low back pain. She has tried numerous treatment options including physical therapy, NSAIDs/muscle relaxants, epidural steroid injections, and opiates.   Patient is currently on tramadol for pain.    She states that for the past week particularly, her low back pain has been worse.  She does not recall any inciting factors. No recent fall, trauma, injury.  Pain is located in the midline of her lumbar spine and radiates laterally.  No reported lower extremity weakness, numbness, tingling, or saddle anesthesia/incontinence.   She continues to take tramadol without difficulty relief. She states that "it takes the edge off".  Her pain is limiting her ability to do her normal activities and care for her grandchildren.    No other complaints at this time.  Review of Systems Per HPI    Objective:   Physical Exam Filed Vitals:   12/24/13 0946  BP: 183/76  Pulse: 88  Temp: 98.3 F (36.8 C)   Exam: General: Appears uncomfortable and is slouching forward. However, she is still very pleasant and interactive. Back: Lumbar spine significantly tender to palpation over the spinous processes.  No spasm noted. Decreased range of motion in all planes secondary to pain.  No lower extremity weakness. Sensation grossly intact.    Assessment & Plan:  See Problem List

## 2013-12-30 ENCOUNTER — Inpatient Hospital Stay: Admission: RE | Admit: 2013-12-30 | Payer: Medicaid Other | Source: Ambulatory Visit

## 2014-01-14 ENCOUNTER — Ambulatory Visit (INDEPENDENT_AMBULATORY_CARE_PROVIDER_SITE_OTHER): Payer: Medicaid Other | Admitting: Psychology

## 2014-01-14 DIAGNOSIS — F32A Depression, unspecified: Secondary | ICD-10-CM

## 2014-01-14 DIAGNOSIS — F329 Major depressive disorder, single episode, unspecified: Secondary | ICD-10-CM

## 2014-01-14 NOTE — Progress Notes (Signed)
Stacy Campbell presented for follow-up.  She reports she is doing reasonably well with the exception of the continued stress of caring for her granddaughter.  This was the focus of our visit.

## 2014-01-14 NOTE — Assessment & Plan Note (Signed)
Mood is reported as euthymic (when Stacy Campbell is out of the house).  A sense of agitation, helplessness, frustration, and sadness descends upon her return from school and with any kind of interaction.  This is so consuming for Stacy Campbell that attempts to work on anything else inevitably are brought back around to [place].  She will have an evaluation at [facility] on 1/19 for an outpatient program.  Stacy Campbell doesn't know the details.  I provided support and validation again today for what is a very challenging situation.  I think Stacy Campbell benefits from this momentarily but not sure what type of long-term (or even short-term) effects this has.  Asked her to identify goals for therapy or benefits she derives for discussion next time.

## 2014-01-15 ENCOUNTER — Other Ambulatory Visit: Payer: Self-pay | Admitting: *Deleted

## 2014-01-15 MED ORDER — DOXEPIN HCL 25 MG PO CAPS: 25.0000 mg | ORAL_CAPSULE | Freq: Every day | ORAL | Status: DC

## 2014-01-20 ENCOUNTER — Other Ambulatory Visit: Payer: Self-pay | Admitting: *Deleted

## 2014-01-21 ENCOUNTER — Encounter: Payer: Self-pay | Admitting: Family Medicine

## 2014-01-21 MED ORDER — LISINOPRIL 20 MG PO TABS: 20.0000 mg | ORAL_TABLET | Freq: Every day | ORAL | Status: DC

## 2014-02-01 ENCOUNTER — Other Ambulatory Visit: Payer: Self-pay | Admitting: *Deleted

## 2014-02-02 ENCOUNTER — Ambulatory Visit (INDEPENDENT_AMBULATORY_CARE_PROVIDER_SITE_OTHER): Payer: Medicaid Other | Admitting: Psychology

## 2014-02-02 DIAGNOSIS — F329 Major depressive disorder, single episode, unspecified: Secondary | ICD-10-CM

## 2014-02-02 DIAGNOSIS — F32A Depression, unspecified: Secondary | ICD-10-CM

## 2014-02-02 MED ORDER — HYDROCHLOROTHIAZIDE 25 MG PO TABS: 25.0000 mg | ORAL_TABLET | Freq: Every day | ORAL | Status: DC

## 2014-02-02 MED ORDER — CARVEDILOL 25 MG PO TABS: 25.0000 mg | ORAL_TABLET | Freq: Two times a day (BID) | ORAL | Status: DC
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
## 2014-02-02 NOTE — Assessment & Plan Note (Addendum)
Mood is reported as euthymic.  Affect is consistent.  Patient volunteers that she is better than when she first started coming to see me.  Thinks the medication is helpful and requests a refill on Abilify (5 mg) to be sent to [facility].  I will ask Dr. Lacinda Axon if he is willing to do that as she does not need to return to [facility] unless there is a significant shift in mood or function.    She has no problem attending therapy but is not actively working on anything right now.  We agreed to space out - next appointment in 6 weeks.  If she (or her PCP) notice a downward shift, we could consider seeing each other more frequently.

## 2014-02-17 ENCOUNTER — Other Ambulatory Visit: Payer: Self-pay | Admitting: Dermatology

## 2014-02-17 DIAGNOSIS — L739 Follicular disorder, unspecified: Secondary | ICD-10-CM | POA: Diagnosis not present

## 2014-03-15 ENCOUNTER — Ambulatory Visit (INDEPENDENT_AMBULATORY_CARE_PROVIDER_SITE_OTHER): Payer: Medicaid Other | Admitting: Psychology

## 2014-03-15 ENCOUNTER — Other Ambulatory Visit: Payer: Self-pay | Admitting: Family Medicine

## 2014-03-15 ENCOUNTER — Other Ambulatory Visit: Payer: Self-pay | Admitting: Nurse Practitioner

## 2014-03-15 DIAGNOSIS — F329 Major depressive disorder, single episode, unspecified: Secondary | ICD-10-CM | POA: Diagnosis not present

## 2014-03-15 DIAGNOSIS — F32A Depression, unspecified: Secondary | ICD-10-CM

## 2014-03-15 DIAGNOSIS — C220 Liver cell carcinoma: Secondary | ICD-10-CM

## 2014-03-15 MED ORDER — ARIPIPRAZOLE 5 MG PO TABS: 5.0000 mg | ORAL_TABLET | Freq: Every day | ORAL | Status: DC

## 2014-03-15 NOTE — Assessment & Plan Note (Addendum)
Mood is depressed.  Affect is restricted.  Thoughts are clear and goal directed.  No evidence of SI / HI.    Stacy Campbell tries to deaden her fight or flight response when dealing with Stacy Campbell and the unintended (but expected) consequence is a flattening of her emotion across the board.  She feels numb.  When Stacy Campbell is gone, there is some up-tick in how she is feeling but it is minimal.  She thinks the Abilify was helping (5 mg) and she ran out about a week ago.  She is scheduling to see Stacy Campbell soon.  Will see if he will refill.  She was stable on this medicine.  Of note, she has some kind of dermatitis that per her report of what the dermatologist said, is a medication reaction.  Stacy Campbell took the Abilify prior to this issue and it has not resolved since she has been off the Abilify.  We discussed the possible role stress is playing in her skin problem.  She does not report that the Prednisone pushes her mood in any significant way.
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
## 2014-03-16 ENCOUNTER — Encounter: Payer: Self-pay | Admitting: *Deleted

## 2014-03-16 NOTE — Progress Notes (Signed)
Received PA approval for Abilify via [employer].  Med approved for 03/16/2014 - 03/09/2045.  [facility] pharmacy informed.  PA confirmation number [id number] Teresita Madura, Rosine Beat, RN

## 2014-03-16 NOTE — Progress Notes (Signed)
Prior Authorization received from [employer] for Abilify. Safety documentation needed to be completed.  ASAP completed; PA pending. Confirmation number [id number] Lilia Pro, RN

## 2014-03-22 ENCOUNTER — Ambulatory Visit (INDEPENDENT_AMBULATORY_CARE_PROVIDER_SITE_OTHER): Payer: Medicaid Other | Admitting: Family Medicine

## 2014-03-22 ENCOUNTER — Encounter: Payer: Self-pay | Admitting: Family Medicine

## 2014-03-22 VITALS — BP 155/98 | HR 108 | Temp 98.3°F | Ht 66.0 in | Wt 250.0 lb

## 2014-03-22 DIAGNOSIS — R21 Rash and other nonspecific skin eruption: Secondary | ICD-10-CM | POA: Insufficient documentation

## 2014-03-22 MED ORDER — ZOLPIDEM TARTRATE 10 MG PO TABS: 10.0000 mg | ORAL_TABLET | Freq: Every evening | ORAL | Status: DC | PRN

## 2014-03-22 MED ORDER — TRAMADOL HCL 50 MG PO TABS: 50.0000 mg | ORAL_TABLET | Freq: Three times a day (TID) | ORAL | Status: DC | PRN

## 2014-03-22 MED ORDER — ALPRAZOLAM 1 MG PO TABS: ORAL_TABLET | ORAL | Status: DC

## 2014-03-22 NOTE — Progress Notes (Signed)
   Subjective:    Patient ID: Stacy Campbell, female    DOB: 1955/11/28, 59 y.o.   MRN: [id number]  HPI 59 year old female with a history of prurigo nodularis, depression/anxiety presents with complaints of rash and itching.  1) Rash/Itching  Patient has been followed by dermatology.  Recently over the past few months she developed a diffuse rash - abdomen/chronic, back, arms, legs.   She was seen by dermatology and a biopsy was obtained.  It revealed findings consistent with urticaria/allergic reaction.  She was treated with prednisone and antibiotics (Bactrim).  She presents today for follow-up regarding this.  She states that she has had minimal improvement with treatment as indicated above.  She continues to have diffuse itching and has found little relief (no significant significant improvement with the above treatment as well as doxepin, Benadryl, Atarax). Xanax does help some.   No recent fevers, chills. No new exposures; however patient was started on Abilify in October.  She does note that the rash started before this but she thinks it could be contributing.    Additionally, her grandson has a similar rash.  She has been treated for scabies in the past with little improvement.  Review of Systems Per HPI    Objective:   Physical Exam Filed Vitals:   03/22/14 1055  BP: 155/98  Pulse: 108  Temp: 98.3 F (36.8 C)   Exam: General: well appearing, NAD. Skin: diffuse papules with surrounding erythema.  Lesions with eschar from excoriation. Patient does have a sparing in the middle of her back or she is unable to reach.  Psych: normal mood and affect. Patient smiling and joking during exam which is a marked improvement from prior interactions.    Assessment & Plan:  See Problem List

## 2014-03-22 NOTE — Assessment & Plan Note (Signed)
Unclear etiology but patient does have biopsy report which revealed urticaria. She's had little improvement with topical steroids as well as prednisone. He is also had little improvement with oral medications for itching. I do not suspect scabies. Given the above, will place referral to alternative dermatologist. I also advised her to discontinue her Abilify as this could be contributing (less than 2% per uptodate).

## 2014-04-14 ENCOUNTER — Ambulatory Visit
Admission: RE | Admit: 2014-04-14 | Discharge: 2014-04-14 | Disposition: A | Discharge status: Home / Self Care | Payer: Medicaid Other | Source: Ambulatory Visit | Attending: Nurse Practitioner | Admitting: Nurse Practitioner

## 2014-04-14 DIAGNOSIS — Z8619 Personal history of other infectious and parasitic diseases: Secondary | ICD-10-CM | POA: Diagnosis not present

## 2014-04-14 DIAGNOSIS — C220 Liver cell carcinoma: Secondary | ICD-10-CM

## 2014-04-14 DIAGNOSIS — K8020 Calculus of gallbladder without cholecystitis without obstruction: Secondary | ICD-10-CM | POA: Diagnosis not present

## 2014-04-15 ENCOUNTER — Other Ambulatory Visit: Payer: Self-pay | Admitting: *Deleted

## 2014-04-21 DIAGNOSIS — B1920 Unspecified viral hepatitis C without hepatic coma: Secondary | ICD-10-CM | POA: Diagnosis not present

## 2014-04-21 DIAGNOSIS — K7460 Unspecified cirrhosis of liver: Secondary | ICD-10-CM | POA: Diagnosis not present

## 2014-04-26 DIAGNOSIS — L299 Pruritus, unspecified: Secondary | ICD-10-CM | POA: Diagnosis not present

## 2014-04-26 DIAGNOSIS — L981 Factitial dermatitis: Secondary | ICD-10-CM | POA: Diagnosis not present

## 2014-04-26 DIAGNOSIS — L308 Other specified dermatitis: Secondary | ICD-10-CM | POA: Diagnosis not present

## 2014-04-26 DIAGNOSIS — R21 Rash and other nonspecific skin eruption: Secondary | ICD-10-CM | POA: Diagnosis not present

## 2014-04-28 ENCOUNTER — Other Ambulatory Visit: Payer: Self-pay | Admitting: *Deleted

## 2014-04-28 MED ORDER — ONDANSETRON HCL 4 MG PO TABS: 4.0000 mg | ORAL_TABLET | Freq: Three times a day (TID) | ORAL | Status: DC | PRN

## 2014-04-30 ENCOUNTER — Ambulatory Visit (INDEPENDENT_AMBULATORY_CARE_PROVIDER_SITE_OTHER): Payer: Medicare Other | Admitting: Psychology

## 2014-04-30 DIAGNOSIS — F329 Major depressive disorder, single episode, unspecified: Secondary | ICD-10-CM | POA: Diagnosis not present

## 2014-04-30 DIAGNOSIS — F32A Depression, unspecified: Secondary | ICD-10-CM

## 2014-04-30 NOTE — Progress Notes (Signed)
Stacy Campbell presented for follow-up.  It was a 30 minute follow-up scheduled as part of the new Integrated Model of care.  She is not seeing Dr. Lacinda Axon today, however she does have a follow-up scheduled with him soon.    Stacy Campbell reported that things are largely the same with both the skin issue and with Stacy Campbell.  Discussed both in detail.    Of note, she went to a new Derm in [place] - where her grandson goes - and was recommended to stop the Abilify until they figure out what is going on.  She did and she feels worse.  She took one Abilify to see if she started itching again and she did not.  She thinks she may restart it.

## 2014-04-30 NOTE — Assessment & Plan Note (Signed)
Report of mood is depressed.  Affect is consistent.  She is tearful today but manages it.  There does seem to be a downturn in mood which could be a function of the continued chronic stress, the health issue that has been unrelenting for about two months, and the stoppage of her Abilify.  I made the following recommendations:  1) Call the Derm office (she isn't scheduled to go back for two more weeks) to see if she can restart Abilify.  I am concerned about her potentially showing up for a return visit - no better - and having not followed "the treatment plan."  2) [facility] will be sending a counselor to the house to work with [employer].  Delsa thinks Chad may present very well with this person there.  Encouraged a particular approach if this is the case.    3) Encouraged her to schedule follow-ups with Dr. Lacinda Axon on Monday or Friday mornings so I can touch base with her.  Discussed whether she needed more ongoing counseling somewhere else but she declined.  Also of note - she mentioned it is like "Deberah Castle was never here."  She has been so distracted by Chad and her health that she has not been thinking about her daughter.  This is not a good thing for her.

## 2014-05-05 ENCOUNTER — Telehealth: Payer: Self-pay | Admitting: *Deleted

## 2014-05-05 ENCOUNTER — Encounter: Payer: Self-pay | Admitting: Family Medicine

## 2014-05-05 ENCOUNTER — Ambulatory Visit (INDEPENDENT_AMBULATORY_CARE_PROVIDER_SITE_OTHER): Payer: Medicare Other | Admitting: Family Medicine

## 2014-05-05 VITALS — BP 144/92 | HR 98 | Temp 99.5°F | Ht 66.0 in | Wt 249.1 lb

## 2014-05-05 DIAGNOSIS — M25561 Pain in right knee: Secondary | ICD-10-CM | POA: Diagnosis present

## 2014-05-05 DIAGNOSIS — I10 Essential (primary) hypertension: Secondary | ICD-10-CM | POA: Diagnosis not present

## 2014-05-05 DIAGNOSIS — J449 Chronic obstructive pulmonary disease, unspecified: Secondary | ICD-10-CM | POA: Diagnosis not present

## 2014-05-05 MED ORDER — TIOTROPIUM BROMIDE MONOHYDRATE 18 MCG IN CAPS: ORAL_CAPSULE | RESPIRATORY_TRACT | Status: DC

## 2014-05-05 MED ORDER — BENZONATATE 100 MG PO CAPS: 100.0000 mg | ORAL_CAPSULE | Freq: Three times a day (TID) | ORAL | Status: DC | PRN

## 2014-05-05 MED ORDER — AMLODIPINE BESYLATE 5 MG PO TABS: 5.0000 mg | ORAL_TABLET | Freq: Every day | ORAL | Status: DC

## 2014-05-05 NOTE — Telephone Encounter (Signed)
Received a message from Tye needing to verify Spiriva directions please call [phone]. Derl Barrow, RN

## 2014-05-05 NOTE — Progress Notes (Signed)
   Subjective:    Patient ID: Stacy Campbell, female    DOB: 05/04/1955, 59 y.o.   MRN: [id number]  HPI 59 year old female with a past medical history of COPD/asthma, depression, hepatitis C status post treatment (now cured per recent notes), and hypertension presents for follow-up.  CC today - SOB/Wheezing.  1) Asthma  Patient has a known history of obstructive lung disease consistent with asthma given improvement in pulmonary function tests following albuterol. This is somewhat complicated by the fact that she was a prior smoker (there may be a component of COPD).  Patient states for the past 2 months she's been experiencing frequent shortness of breath and wheezing.  Patient states that she uses albuterol 3 times daily.  She also states that she uses albuterol proximal to 2 times a week at night.  She is currently on [facility] for maintenance therapy as well as similar.  No recent fevers or chills. She does have a nonproductive cough.  2) HTN  Uncontrolled.   Home BP Monitoring - No.   Medications - Coreg, HCTZ, lisinopril.  Compliance -  Yes.   ROS: Denies chest pain.   Social Hx - Former smoker.  Review of Systems  Constitutional: Negative for fever and chills.  Respiratory: Positive for shortness of breath and wheezing.       Objective:   Physical Exam Vital signs reviewed.  Exam: General: well appearing, NAD. Cardiovascular: RRR. No murmur.  Respiratory: CTAB. No rales, rhonchi, or wheeze.    Assessment & Plan:  See Problem List

## 2014-05-05 NOTE — Assessment & Plan Note (Signed)
Uncontrolled. Repeat blood pressure was 144/92. Adding amlodipine 5 mg daily.

## 2014-05-05 NOTE — Assessment & Plan Note (Addendum)
Worsening; Mild exacerbation. Patient to continue [facility] and singular. Adding Spiriva today after discussion with pharmacist Dr. Valentina Lucks.  No indication for antibiotics or steroid burst at this time. Patient to follow-up in approximately 2 weeks for reevaluation.

## 2014-05-05 NOTE — Telephone Encounter (Signed)
Per Dr. Lacinda Axon, 2 inhalation once daily.  Derl Barrow, RN

## 2014-05-05 NOTE — Progress Notes (Signed)
I was preceptor the day of this visit.   

## 2014-05-05 NOTE — Patient Instructions (Signed)
It was nice to see you today.  I am starting Spiriva for your Asthma in addition to your other meds.  I have also added Amlodipine for your blood pressure.  Follow up in ~ 1-2 weeks to see how your SOB/Asthma is doing.  Take care  Dr. Lacinda Axon

## 2014-05-06 ENCOUNTER — Telehealth: Payer: Self-pay | Admitting: Family Medicine

## 2014-05-06 MED ORDER — PROMETHAZINE HCL 25 MG PO TABS: 25.0000 mg | ORAL_TABLET | Freq: Three times a day (TID) | ORAL | Status: DC | PRN

## 2014-05-06 NOTE — Telephone Encounter (Signed)
Pt called and needs a refill on her Phenergan. jw

## 2014-05-12 ENCOUNTER — Other Ambulatory Visit: Payer: Self-pay | Admitting: *Deleted

## 2014-05-14 ENCOUNTER — Other Ambulatory Visit: Payer: Self-pay | Admitting: Family Medicine

## 2014-05-14 MED ORDER — TRAMADOL HCL 50 MG PO TABS: 50.0000 mg | ORAL_TABLET | Freq: Three times a day (TID) | ORAL | Status: DC | PRN

## 2014-05-17 DIAGNOSIS — L300 Nummular dermatitis: Secondary | ICD-10-CM | POA: Diagnosis not present

## 2014-05-17 DIAGNOSIS — L299 Pruritus, unspecified: Secondary | ICD-10-CM | POA: Diagnosis not present

## 2014-05-17 DIAGNOSIS — L819 Disorder of pigmentation, unspecified: Secondary | ICD-10-CM | POA: Diagnosis not present

## 2014-05-24 ENCOUNTER — Ambulatory Visit (INDEPENDENT_AMBULATORY_CARE_PROVIDER_SITE_OTHER): Payer: Medicare Other | Admitting: Family Medicine

## 2014-05-24 VITALS — BP 154/83 | HR 86 | Temp 98.1°F | Ht 65.0 in | Wt 248.0 lb

## 2014-05-24 DIAGNOSIS — R55 Syncope and collapse: Secondary | ICD-10-CM | POA: Insufficient documentation

## 2014-05-24 DIAGNOSIS — R0609 Other forms of dyspnea: Secondary | ICD-10-CM

## 2014-05-24 DIAGNOSIS — R0600 Dyspnea, unspecified: Secondary | ICD-10-CM | POA: Insufficient documentation

## 2014-05-24 DIAGNOSIS — M25561 Pain in right knee: Secondary | ICD-10-CM | POA: Diagnosis present

## 2014-05-24 MED ORDER — MECLIZINE HCL 25 MG PO TABS: 25.0000 mg | ORAL_TABLET | Freq: Three times a day (TID) | ORAL | Status: DC | PRN

## 2014-05-24 NOTE — Progress Notes (Signed)
   Subjective:    Patient ID: Stacy Campbell, female    DOB: 1955/09/01, 59 y.o.   MRN: [id number]  HPI 59 year old female with a past medical history of COPD/asthma, depression, and hypertension presents for follow-up  1) SOB - Patient has a long standing history of obstructive lung disease. - More recently, she has been experiencing significant dyspnea on exertion.  She states that she gets short of breath when walking around in her house. - Relieved by rest. - She reports intermittent wheezing. - She denies any lower extremity edema. No reports of PND or orthopnea. - She also denies any associated chest pain.  - She has no cardiac history but does have hypertension and obesity.   2) Presyncope - Patient reports recent bouts with what she calls vertigo. - Last "episode" was Saturday.  She states that she felt as if "everything was drifting away".  She also reports that there was a "wooshing" in her ears. - No reports of spinning/vertiginous symptoms. - She did report some gait instability.  - She states that she has had similar issues with this in the past. - No exacerbating factors. - She has had some relief with Meclizine. - No association with exertion.  Social Hx - Former Smoker.  Review of Systems  Respiratory: Positive for shortness of breath and wheezing.   Cardiovascular: Negative for chest pain and leg swelling.  Gastrointestinal: Positive for nausea.  Neurological: Positive for dizziness.      Objective:   Physical Exam Filed Vitals:   05/24/14 0848  BP: 154/83  Pulse: 86  Temp: 98.1 F (36.7 C)   Vital signs reviewed.  Exam: General: well appearing obese female in NAD.  HEENT: NCAT. Normal TM's bilaterally.  Cardiovascular: RRR. No murmurs, rubs, or gallops. Respiratory: CTAB. No rales, rhonchi, or wheeze. Abdomen: soft, nontender, nondistended. Extremities:  No LE edema. Skin: diffuse rash with excoriation noted.  Neuro: No focal deficits.      Assessment & Plan:  See Problem List

## 2014-05-24 NOTE — Assessment & Plan Note (Addendum)
New problem, uncertain diagnosis at this point.  I do not feel this is secondary to her underlying obstructive lung disease. Given significant dyspnea on exertion and risk factors there is concerned about cardiac origin. I precepted this patient with attending Dr. Nori Riis.  She recommended cardiology evaluation stress test versus stress echo. Sending to cardiology for further evaluation.

## 2014-05-24 NOTE — Assessment & Plan Note (Signed)
Unclear etiology.  This does not appear to be cardiac in origin but somewhat concerning given DOE. Cardiology referral.

## 2014-05-25 NOTE — Progress Notes (Signed)
I was preceptor for this office visit.  

## 2014-05-29 ENCOUNTER — Other Ambulatory Visit: Payer: Self-pay | Admitting: Family Medicine

## 2014-05-31 ENCOUNTER — Telehealth: Payer: Self-pay | Admitting: Family Medicine

## 2014-05-31 NOTE — Telephone Encounter (Signed)
5.23.16 spoke w/pt to sched new pt appt and pt stated she didn't need to see a cardiologist. I advised pt that if she decide in the future she wanted an appt she would have to call Dr Geralynn Ochs office back and a new referral would need to be entered. Deleting from [facility].Marland Kitchenand sending message back to Dr Jonathon Jordan office.dwm

## 2014-06-02 ENCOUNTER — Telehealth: Payer: Self-pay | Admitting: Family Medicine

## 2014-06-02 MED ORDER — OXYCODONE-ACETAMINOPHEN 5-325 MG PO TABS: 1.0000 | ORAL_TABLET | Freq: Three times a day (TID) | ORAL | Status: DC | PRN

## 2014-06-02 NOTE — Telephone Encounter (Signed)
Rx ready for pick up. 

## 2014-06-02 NOTE — Telephone Encounter (Signed)
Back is out and the tramadol is not working Would like something stronger

## 2014-06-09 DIAGNOSIS — L300 Nummular dermatitis: Secondary | ICD-10-CM | POA: Diagnosis not present

## 2014-06-09 DIAGNOSIS — L819 Disorder of pigmentation, unspecified: Secondary | ICD-10-CM | POA: Diagnosis not present

## 2014-08-11 DIAGNOSIS — L400 Psoriasis vulgaris: Secondary | ICD-10-CM | POA: Diagnosis not present

## 2014-08-11 DIAGNOSIS — L309 Dermatitis, unspecified: Secondary | ICD-10-CM | POA: Diagnosis not present

## 2014-08-11 DIAGNOSIS — L280 Lichen simplex chronicus: Secondary | ICD-10-CM | POA: Diagnosis not present

## 2014-08-17 ENCOUNTER — Ambulatory Visit (INDEPENDENT_AMBULATORY_CARE_PROVIDER_SITE_OTHER): Payer: Medicare Other | Admitting: Family Medicine

## 2014-08-17 VITALS — BP 162/94 | HR 78 | Temp 98.6°F | Ht 65.0 in | Wt 246.2 lb

## 2014-08-17 DIAGNOSIS — G8929 Other chronic pain: Secondary | ICD-10-CM | POA: Diagnosis not present

## 2014-08-17 DIAGNOSIS — L400 Psoriasis vulgaris: Secondary | ICD-10-CM | POA: Diagnosis not present

## 2014-08-17 DIAGNOSIS — M545 Low back pain: Secondary | ICD-10-CM | POA: Diagnosis not present

## 2014-08-17 DIAGNOSIS — M25561 Pain in right knee: Secondary | ICD-10-CM | POA: Diagnosis present

## 2014-08-17 MED ORDER — OXYCODONE-ACETAMINOPHEN 5-325 MG PO TABS: 1.0000 | ORAL_TABLET | Freq: Three times a day (TID) | ORAL | Status: DC | PRN

## 2014-08-17 NOTE — Progress Notes (Signed)
Subjective:     Patient ID: Stacy Campbell, female   DOB: 1955-03-12, 59 y.o.   MRN: [id number]  HPI Stacy Campbell is a 59yo female presenting today for acute on chronic back pain. - Has had acute flare of chronic pain since Sunday (87/16/). States she is unable to carry out ADLs due to significant pain. States she is unable to sit up straight and has trouble walking due to pain.  - Lumbar films in 2011 with degenerative changes at L5-S1, L3-L4, L4-L5 - Lumbar films in 2008 with bilateral L5-S1 facet joint arthritic changes and degenerative changes at L3-L4 - Reports past relief with opioids. This helps her take care of her grandchildren and carry out her normal ADLs which she is unable to do during acute flares. - Chart review she has tried Tramadol without relief. Has tried TENS unit with minimal relief. Has tried Lyrica with minimal relief. Has tried Flexeril without relief.  Has tried Ibuprofen without relief.  Review of Systems  Constitutional: Negative for fever.  Respiratory: Negative for shortness of breath.   Cardiovascular: Negative for chest pain.  Musculoskeletal: Positive for back pain and gait problem.      Objective:   Physical Exam  Constitutional: She is oriented to person, place, and time. She appears well-developed and well-nourished. No distress.  Cardiovascular: Normal rate and regular rhythm.  Exam reveals no gallop and no friction rub.   No murmur heard. Pulmonary/Chest: Effort normal. No respiratory distress. She has no wheezes. She has no rales.  Abdominal: She exhibits no distension. There is no tenderness. There is no rebound.  Musculoskeletal: She exhibits no edema.  Right innominate rotated forward anteriorly, tenderness over lumbar spine  Neurological: She is alert and oriented to person, place, and time.  Skin: Skin is warm and dry. No rash noted.  Psychiatric: She has a normal mood and affect. Her behavior is normal. Judgment and thought content  normal.      Assessment:     Please refer to Problem List for Assessment.    Plan:     Please refer to Problem List for Plan.

## 2014-08-17 NOTE — Patient Instructions (Signed)

## 2014-08-18 NOTE — Assessment & Plan Note (Signed)
-   Acute on Chronic Back Pain. Unable to carry out ADLs adequately - Prescription given for Roxicet, no refills. Discussed that this is not a chronic medication and refills will not be given. Only for acute exacerbation. - Exercises given for core strengthening - Follow up at pain clinic

## 2014-08-20 DIAGNOSIS — L400 Psoriasis vulgaris: Secondary | ICD-10-CM | POA: Diagnosis not present

## 2014-08-23 ENCOUNTER — Other Ambulatory Visit: Payer: Self-pay | Admitting: Family Medicine

## 2014-08-23 MED ORDER — ALPRAZOLAM 1 MG PO TABS: ORAL_TABLET | ORAL | Status: DC

## 2014-08-23 NOTE — Telephone Encounter (Signed)
Pt calling and states that a refill is needed on her ALPRAZolam Duanne Moron) 1 MG tablet , believes that new PCP may have forgotten this at her last visit 08/17/14. Sadie Reynolds, ASA

## 2014-08-24 DIAGNOSIS — L400 Psoriasis vulgaris: Secondary | ICD-10-CM | POA: Diagnosis not present

## 2014-08-25 DIAGNOSIS — L309 Dermatitis, unspecified: Secondary | ICD-10-CM | POA: Diagnosis not present

## 2014-08-25 DIAGNOSIS — L280 Lichen simplex chronicus: Secondary | ICD-10-CM | POA: Diagnosis not present

## 2014-08-25 DIAGNOSIS — L400 Psoriasis vulgaris: Secondary | ICD-10-CM | POA: Diagnosis not present

## 2014-08-31 DIAGNOSIS — L400 Psoriasis vulgaris: Secondary | ICD-10-CM | POA: Diagnosis not present

## 2014-09-03 DIAGNOSIS — L400 Psoriasis vulgaris: Secondary | ICD-10-CM | POA: Diagnosis not present

## 2014-09-07 DIAGNOSIS — L400 Psoriasis vulgaris: Secondary | ICD-10-CM | POA: Diagnosis not present

## 2014-09-14 DIAGNOSIS — L400 Psoriasis vulgaris: Secondary | ICD-10-CM | POA: Diagnosis not present

## 2014-09-16 ENCOUNTER — Telehealth: Payer: Self-pay | Admitting: Family Medicine

## 2014-09-16 NOTE — Telephone Encounter (Signed)
Stacy Campbell from [facility] left message on medical records line wanting to check the status of the rx for an orthopedic back brace.  Please call her at [phone] opt 2 and ref [phone].

## 2014-09-17 DIAGNOSIS — L400 Psoriasis vulgaris: Secondary | ICD-10-CM | POA: Diagnosis not present

## 2014-09-21 DIAGNOSIS — L400 Psoriasis vulgaris: Secondary | ICD-10-CM | POA: Diagnosis not present

## 2014-09-24 DIAGNOSIS — L400 Psoriasis vulgaris: Secondary | ICD-10-CM | POA: Diagnosis not present

## 2014-09-27 ENCOUNTER — Other Ambulatory Visit: Payer: Self-pay | Admitting: *Deleted

## 2014-09-27 ENCOUNTER — Telehealth: Payer: Self-pay | Admitting: Family Medicine

## 2014-09-27 NOTE — Telephone Encounter (Signed)
Pt called and needs a refill on her Ambien. jw

## 2014-09-27 NOTE — Telephone Encounter (Deleted)
Will give Mrs. Metzer enough for 30 days. Please have her come in for an office visit to discuss further refills.

## 2014-09-28 DIAGNOSIS — L400 Psoriasis vulgaris: Secondary | ICD-10-CM | POA: Diagnosis not present

## 2014-09-29 MED ORDER — ZOLPIDEM TARTRATE 10 MG PO TABS: 10.0000 mg | ORAL_TABLET | Freq: Every evening | ORAL | Status: DC | PRN

## 2014-09-29 NOTE — Telephone Encounter (Signed)
Pt calling to check on status of this request. Thank you, Fonda Kinder, ASA

## 2014-09-29 NOTE — Telephone Encounter (Signed)
Prescription called in for 30 day supply of Ambien 10mg  at bedtime as needed for sleep. Will need appointment to establish care with me as PCP for future refills.

## 2014-09-30 NOTE — Telephone Encounter (Signed)
Back brace not prescribed. Per patient does not want brace as pain is well controlled with medication.

## 2014-10-01 DIAGNOSIS — L400 Psoriasis vulgaris: Secondary | ICD-10-CM | POA: Diagnosis not present

## 2014-10-05 DIAGNOSIS — L400 Psoriasis vulgaris: Secondary | ICD-10-CM | POA: Diagnosis not present

## 2014-10-06 ENCOUNTER — Other Ambulatory Visit: Payer: Self-pay | Admitting: *Deleted

## 2014-10-06 MED ORDER — SERTRALINE HCL 100 MG PO TABS: 150.0000 mg | ORAL_TABLET | Freq: Every day | ORAL | Status: DC

## 2014-10-08 DIAGNOSIS — L400 Psoriasis vulgaris: Secondary | ICD-10-CM | POA: Diagnosis not present

## 2014-10-11 DIAGNOSIS — L400 Psoriasis vulgaris: Secondary | ICD-10-CM | POA: Diagnosis not present

## 2014-10-11 DIAGNOSIS — L299 Pruritus, unspecified: Secondary | ICD-10-CM | POA: Diagnosis not present

## 2014-10-11 DIAGNOSIS — L309 Dermatitis, unspecified: Secondary | ICD-10-CM | POA: Diagnosis not present

## 2014-10-11 DIAGNOSIS — L819 Disorder of pigmentation, unspecified: Secondary | ICD-10-CM | POA: Diagnosis not present

## 2014-10-11 DIAGNOSIS — L818 Other specified disorders of pigmentation: Secondary | ICD-10-CM | POA: Diagnosis not present

## 2014-10-11 DIAGNOSIS — L853 Xerosis cutis: Secondary | ICD-10-CM | POA: Diagnosis not present

## 2014-10-11 DIAGNOSIS — R234 Changes in skin texture: Secondary | ICD-10-CM | POA: Diagnosis not present

## 2014-10-12 ENCOUNTER — Other Ambulatory Visit: Payer: Self-pay | Admitting: Gastroenterology

## 2014-10-12 DIAGNOSIS — K7469 Other cirrhosis of liver: Secondary | ICD-10-CM

## 2014-10-12 DIAGNOSIS — Z23 Encounter for immunization: Secondary | ICD-10-CM | POA: Diagnosis not present

## 2014-10-12 DIAGNOSIS — B1920 Unspecified viral hepatitis C without hepatic coma: Secondary | ICD-10-CM | POA: Diagnosis not present

## 2014-10-12 DIAGNOSIS — R110 Nausea: Secondary | ICD-10-CM | POA: Diagnosis not present

## 2014-10-12 DIAGNOSIS — Z1211 Encounter for screening for malignant neoplasm of colon: Secondary | ICD-10-CM | POA: Diagnosis not present

## 2014-10-12 DIAGNOSIS — K7460 Unspecified cirrhosis of liver: Secondary | ICD-10-CM | POA: Diagnosis not present

## 2014-10-15 ENCOUNTER — Ambulatory Visit (INDEPENDENT_AMBULATORY_CARE_PROVIDER_SITE_OTHER): Payer: Medicare Other | Admitting: Family Medicine

## 2014-10-15 VITALS — BP 181/87 | HR 91 | Temp 97.9°F | Ht 65.0 in | Wt 244.2 lb

## 2014-10-15 DIAGNOSIS — Z0000 Encounter for general adult medical examination without abnormal findings: Secondary | ICD-10-CM | POA: Diagnosis not present

## 2014-10-15 DIAGNOSIS — Z23 Encounter for immunization: Secondary | ICD-10-CM

## 2014-10-15 DIAGNOSIS — I10 Essential (primary) hypertension: Secondary | ICD-10-CM | POA: Diagnosis not present

## 2014-10-15 DIAGNOSIS — R69 Illness, unspecified: Secondary | ICD-10-CM | POA: Diagnosis not present

## 2014-10-15 DIAGNOSIS — M25561 Pain in right knee: Secondary | ICD-10-CM | POA: Diagnosis present

## 2014-10-15 DIAGNOSIS — G4700 Insomnia, unspecified: Secondary | ICD-10-CM | POA: Diagnosis not present

## 2014-10-15 DIAGNOSIS — R252 Cramp and spasm: Secondary | ICD-10-CM

## 2014-10-15 LAB — BASIC METABOLIC PANEL
BUN: 6 mg/dL — ABNORMAL LOW (ref 7–25)
CHLORIDE: 105 mmol/L (ref 98–110)
CO2: 31 mmol/L (ref 20–31)
CREATININE: 0.61 mg/dL (ref 0.50–1.05)
Calcium: 8.6 mg/dL (ref 8.6–10.4)
Glucose, Bld: 110 mg/dL — ABNORMAL HIGH (ref 65–99)
POTASSIUM: 3.6 mmol/L (ref 3.5–5.3)
Sodium: 142 mmol/L (ref 135–146)

## 2014-10-15 MED ORDER — AMLODIPINE BESYLATE 10 MG PO TABS: 10.0000 mg | ORAL_TABLET | Freq: Every day | ORAL | Status: DC

## 2014-10-15 MED ORDER — ZOLPIDEM TARTRATE 10 MG PO TABS
10.0000 mg | ORAL_TABLET | Freq: Every evening | ORAL | Status: DC | PRN
Start: 2014-10-15 — End: 2015-04-08

## 2014-10-15 NOTE — Patient Instructions (Addendum)
Thank you so much for coming to visit today! We will check your electrolytes today since you are on multiple chronic medications; sometimes this can let us know why you are having muscle cramps as well. If this is normal, I will give you another medication for cramps. I am also refilling your Ambien today. You are up to date on your colonoscopy and pap smear, however you do need a mammogram. Please schedule an appointment with Dr. Gwenlyn Saran as needed. Your blood pressure was pretty elevated, so we are increasing your Amlodipine to 10mg . I sent in a prescription, but you can take two tablets of the 5mg  until you run out. Please return in two weeks so we can recheck your blood pressure and adjust your medicine accordingly. If you develop numbness, weakness, or chest pain, please call 911. If you develop a worsening headache that does not go away, please come in to have your blood pressure checked.  Thanks again! Dr. Gerlean Ren

## 2014-10-17 DIAGNOSIS — Z01810 Encounter for preprocedural cardiovascular examination: Secondary | ICD-10-CM | POA: Insufficient documentation

## 2014-10-17 DIAGNOSIS — R252 Cramp and spasm: Secondary | ICD-10-CM | POA: Insufficient documentation

## 2014-10-17 NOTE — Assessment & Plan Note (Addendum)
-   BP significantly elevated to 181/87 and remained elevated to 170s/80s on recheck.  - Denies chest pain, weakness, numbness, headache. No neurological deficits noted on exam. - Will increase Amlodipine from 5mg  to 10mg  daily - Continue Coreg 25mg , HCTZ 25mg , and Lisinopril 20mg .  - Follow up in 2 weeks to recheck BP. Consider additional medication changes at that time. - Discussed red flags on when to call 911

## 2014-10-17 NOTE — Assessment & Plan Note (Signed)
-   Information given for Mammogram today - Up to date on Pap Smear and Colonoscopy.

## 2014-10-17 NOTE — Progress Notes (Signed)
Subjective:     Patient ID: Stacy Campbell, female   DOB: 1955-06-17, 59 y.o.   MRN: [id number]  HPI Stacy Campbell is a 59yo female presenting today for annual exam. - Complains of cramping. Previously prescribed MagOx and Zanaflex without effect. - Long history of insomnia noted. Chronically prescribed Ambien, which she reports using 2-3x per week.   Chart review shows Stacy Campbell was already on Ambien for a "lon time" at office visit in 2008. Tried Trazodone, which made her paranoid. Melatonin ineffective. Trial off of Ambien attempted in 2008 and failed, so was restarted. - Reports taking HTN medications without missing doses. Currently prescribed Amlodipine 5mg , Coreg 25mg , HCTZ 25mg , Lisinopril 20mg . - Denies chest pain, headache, numbness, weakness  Health Maintenance: - Last BMP 07/2012 normal except for mild hyperglycemia 110 - Last TSH 07/2011 normal - Last CBC 11/2011 normal - Last lipid panel 05/2009 normal - Last pap smear normal in 2015 - Last colonoscopy in 2011 with repeat recommended in 2021. - Last mammogram 2015  Review of Systems Per HPI    Objective:   Physical Exam  Constitutional: She is oriented to person, place, and time. She appears well-developed and well-nourished. No distress.  HENT:  Head: Normocephalic and atraumatic.  Cardiovascular: Normal rate and regular rhythm.  Exam reveals no gallop and no friction rub.   No murmur heard. Pulmonary/Chest: Effort normal. No respiratory distress. She has no wheezes. She has no rales.  Abdominal: Soft. Bowel sounds are normal. She exhibits no distension. There is no tenderness.  Musculoskeletal: She exhibits no edema.  No calf tenderness noted  Neurological: She is alert and oriented to person, place, and time. No cranial nerve deficit.  Muscle strength 5/5 in upper and lower extremities, no decrease in sensation noted.  Skin: Skin is warm. No rash noted. No erythema.  Psychiatric: She has a normal mood  and affect. Her behavior is normal.       Assessment and Plan:     HYPERTENSION, BENIGN SYSTEMIC - BP significantly elevated to 181/87 and remained elevated to 170s/80s on recheck.  - Denies chest pain, weakness, numbness, headache. No neurological deficits noted on exam. - Will increase Amlodipine from 5mg  to 10mg  daily - Continue Coreg 25mg , HCTZ 25mg , and Lisinopril 20mg .  - Follow up in 2 weeks to recheck BP. Consider additional medication changes at that time. - Discussed red flags on when to call 911  Leg cramps - Will recheck BMP today for possible electrolyte disturbances - Has tried Magnesium Oxide and Zanaflex in the past without effect.  Health care maintenance - Information given for Mammogram today - Up to date on Pap Smear and Colonoscopy.  Insomnia - Refill of Ambien given

## 2014-10-17 NOTE — Assessment & Plan Note (Signed)
-   Will recheck BMP today for possible electrolyte disturbances - Has tried Magnesium Oxide and Zanaflex in the past without effect.

## 2014-10-17 NOTE — Assessment & Plan Note (Signed)
-   Refill of Ambien given

## 2014-10-18 ENCOUNTER — Ambulatory Visit
Admission: RE | Admit: 2014-10-18 | Discharge: 2014-10-18 | Disposition: A | Discharge status: Home / Self Care | Payer: Medicare Other | Source: Ambulatory Visit | Attending: Gastroenterology | Admitting: Gastroenterology

## 2014-10-18 DIAGNOSIS — R161 Splenomegaly, not elsewhere classified: Secondary | ICD-10-CM | POA: Diagnosis not present

## 2014-10-18 DIAGNOSIS — K7469 Other cirrhosis of liver: Secondary | ICD-10-CM

## 2014-10-19 ENCOUNTER — Telehealth: Payer: Self-pay | Admitting: Family Medicine

## 2014-10-19 DIAGNOSIS — L400 Psoriasis vulgaris: Secondary | ICD-10-CM | POA: Diagnosis not present

## 2014-10-19 MED ORDER — GABAPENTIN 100 MG PO CAPS: 100.0000 mg | ORAL_CAPSULE | Freq: Every day | ORAL | Status: DC

## 2014-10-19 NOTE — Telephone Encounter (Signed)
Called concerning lab results. Continues to note muscles spasms. Usually occur at night. Has tried mustard, but this gave her heart burn. Pickle juice ineffective. Has also tried Magnesium Oxide and Zanaflex without effect. Will send in prescription for Gabapentin. History of nausea in past with Gabapentin, but will start at lower dose and titrate up if possible.  Dr. Gerlean Ren

## 2014-10-22 ENCOUNTER — Other Ambulatory Visit: Payer: Self-pay | Admitting: *Deleted

## 2014-10-22 DIAGNOSIS — L400 Psoriasis vulgaris: Secondary | ICD-10-CM | POA: Diagnosis not present

## 2014-10-22 MED ORDER — PROMETHAZINE HCL 25 MG PO TABS: 25.0000 mg | ORAL_TABLET | Freq: Three times a day (TID) | ORAL | Status: DC | PRN

## 2014-10-26 DIAGNOSIS — L400 Psoriasis vulgaris: Secondary | ICD-10-CM | POA: Diagnosis not present

## 2014-11-04 ENCOUNTER — Other Ambulatory Visit: Payer: Self-pay

## 2014-11-04 DIAGNOSIS — Z1231 Encounter for screening mammogram for malignant neoplasm of breast: Secondary | ICD-10-CM

## 2014-11-09 DIAGNOSIS — K7460 Unspecified cirrhosis of liver: Secondary | ICD-10-CM | POA: Diagnosis not present

## 2014-11-09 DIAGNOSIS — Z23 Encounter for immunization: Secondary | ICD-10-CM | POA: Diagnosis not present

## 2014-11-25 DIAGNOSIS — L309 Dermatitis, unspecified: Secondary | ICD-10-CM | POA: Diagnosis not present

## 2014-11-25 DIAGNOSIS — R21 Rash and other nonspecific skin eruption: Secondary | ICD-10-CM | POA: Diagnosis not present

## 2014-11-25 DIAGNOSIS — L853 Xerosis cutis: Secondary | ICD-10-CM | POA: Diagnosis not present

## 2014-12-10 ENCOUNTER — Ambulatory Visit
Admission: RE | Admit: 2014-12-10 | Discharge: 2014-12-10 | Disposition: A | Discharge status: Home / Self Care | Payer: Medicare Other | Source: Ambulatory Visit

## 2014-12-10 DIAGNOSIS — Z1231 Encounter for screening mammogram for malignant neoplasm of breast: Secondary | ICD-10-CM

## 2014-12-28 ENCOUNTER — Other Ambulatory Visit: Payer: Self-pay | Admitting: *Deleted

## 2014-12-28 MED ORDER — ALPRAZOLAM 1 MG PO TABS: ORAL_TABLET | ORAL | Status: DC

## 2015-01-06 ENCOUNTER — Telehealth: Payer: Self-pay | Admitting: Family Medicine

## 2015-01-06 MED ORDER — ALPRAZOLAM 1 MG PO TABS: ORAL_TABLET | ORAL | Status: DC

## 2015-01-06 NOTE — Telephone Encounter (Signed)
Addend other msg. closing

## 2015-01-06 NOTE — Telephone Encounter (Signed)
Pt called and stated that her brother was here to pick up this prescription, however it wasn't to be found. Latina Craver, RN looked up front in various places for traces of this script as well as in the previously faxed files to no avail. I called the pharmacy and checked to see if it had been phoned in, which Ronalee Belts from the pharmacy informed me that it was not. I informed the preceptor, Dr. Erin Hearing. Sadie Reynolds, ASA

## 2015-01-06 NOTE — Telephone Encounter (Signed)
Reviewed [place] database has not been filled this month  Will write another Rx to be called in for one month  Dr Gerlean Ren can follow up on more refills if needed

## 2015-01-06 NOTE — Telephone Encounter (Signed)
Refill called in to [facility] ALPRAZolam (XANAX) 1 MG tablet: TAKE 1 TABLET BY MOUTH TWICE DAILY AS NEEDED FOR ANXIETY, #60, NO Refills per Dr. Erin Hearing.  Derl Barrow, RN

## 2015-01-06 NOTE — Addendum Note (Signed)
Addended by: Talbert Cage L on: 01/06/2015 04:50 PM   Modules accepted: Orders

## 2015-01-18 DIAGNOSIS — L819 Disorder of pigmentation, unspecified: Secondary | ICD-10-CM | POA: Diagnosis not present

## 2015-01-18 DIAGNOSIS — L299 Pruritus, unspecified: Secondary | ICD-10-CM | POA: Diagnosis not present

## 2015-01-18 DIAGNOSIS — L209 Atopic dermatitis, unspecified: Secondary | ICD-10-CM | POA: Diagnosis not present

## 2015-01-18 DIAGNOSIS — L281 Prurigo nodularis: Secondary | ICD-10-CM | POA: Diagnosis not present

## 2015-01-21 ENCOUNTER — Encounter: Payer: Self-pay | Admitting: Family Medicine

## 2015-01-21 ENCOUNTER — Ambulatory Visit (INDEPENDENT_AMBULATORY_CARE_PROVIDER_SITE_OTHER): Payer: Medicare Other | Admitting: Family Medicine

## 2015-01-21 VITALS — BP 136/69 | HR 86 | Temp 99.1°F | Ht 65.0 in | Wt 242.0 lb

## 2015-01-21 DIAGNOSIS — R252 Cramp and spasm: Secondary | ICD-10-CM | POA: Diagnosis not present

## 2015-01-21 LAB — MAGNESIUM: Magnesium: 1.6 mg/dL (ref 1.5–2.5)

## 2015-01-21 NOTE — Patient Instructions (Addendum)
Thank you so much for coming to visit today!  Please try to use Mustard before bed. You may also drink Tonic Water throughout the day to help with the cramps. We will check a lab today and you will be contacted with the results.  Thanks again! Dr. Gerlean Ren

## 2015-01-22 NOTE — Assessment & Plan Note (Addendum)
-   No improvement - Will recheck Magnesium - Unfortunately all medications attempted have been ineffective or not tolerated. - Recommend taking prophylaxis Mustard before bed. Seems to relieve pain, but patient states it takes a while to become effective. - Tonic Water - On multiple medications that can cause cramping. Will review medications with pharmacy. - Follow up PRN

## 2015-01-22 NOTE — Progress Notes (Signed)
Subjective:     Patient ID: Stacy Campbell, female   DOB: March 12, 1955, 60 y.o.   MRN: [id number]  HPI Stacy Campbell is a 60yo female presenting today for leg cramps: - Chart Review shows:  First mentioned in 2010. Bilateral. Reported for several months. Denies burning, tingling, or numbness, stating it just "hurt." Started in toes and then moves to feet, ankles, calves, and sometimes to thighs. Salt water helped. Lasted 14minutes. Tonic water recommended (one glass daily).  2011: Tonic water helped some with cramps. Also reported walking around helped with pain. Recommended increasing potassium and magnesium in diet with fruits and vegetables. Flexeril prescribed.  2013: Duplex US without DVT and ABI normal. Trial of Gabapentin and reported improvement with 100mg  TID.  2014: Vitamin B Complex prescribed. Reports no relief with Flexeril.  2015: Denies relief with Flexeril, Robaxin, Mustard, Zanaflex. No relief with PT. Magnesium lower level of normal, Magnesium Oxide prescribed.  2016: Another trial of Gabapentin attempted given prior effectiveness. - Reports no improvement in symptoms of leg cramps - Cramps occur bilaterally, but usually in only one leg per night. Does not occur in one leg more often than the other. - Cramps start in toes and then extend up to ankles, calves, and thighs - States pain is so severe she is unable to walk. Sometimes are so severe she thinks about going to ED. - Occurs every other night and often lasts approximately 31min - Reports no relief after attempting multiple medications: Vitamin B, Flexeril, Robaxin, Zanaflex, Magnesium Oxide, Gabapentin. Has also tried several natural remedies, such as mustard and tonic water. States mustard helps some but takes time to work. Reports nausea with Gabapentin. - On multiple medications that can cause cramping, however started all in 2008 except for Abilify. Started Abilify in 2015, however she had long history of  cramping prior. - Stretches nightly - Denies weakness, numbness, tingling, incontinence  Review of Systems Per HPI. Other systems negative.    Objective:   Physical Exam  Constitutional: She appears well-developed and well-nourished. No distress.  HENT:  Head: Normocephalic and atraumatic.  Cardiovascular: Normal rate and regular rhythm.  Exam reveals no gallop and no friction rub.   No murmur heard. Pulmonary/Chest: Effort normal. No respiratory distress. She has no wheezes.  Musculoskeletal: She exhibits no edema.  Neurological:  Sensation intact over lower extremities bilaterally. Calves symmetric. Negative Homan's. Muscle strength 5/5 bilateral lower extremities.  Psychiatric: She has a normal mood and affect. Her behavior is normal.      Assessment and Plan:     Leg cramps - No improvement - Will recheck Magnesium - Unfortunately all medications attempted have been ineffective or not tolerated. - Recommend taking prophylaxis Mustard before bed. Seems to relieve pain, but patient states it takes a while to become effective. - Tonic Water - On multiple medications that can cause cramping. Will review medications with pharmacy. - Follow up PRN

## 2015-01-27 ENCOUNTER — Ambulatory Visit (INDEPENDENT_AMBULATORY_CARE_PROVIDER_SITE_OTHER): Payer: Medicare Other | Admitting: Psychology

## 2015-01-27 DIAGNOSIS — F32A Depression, unspecified: Secondary | ICD-10-CM

## 2015-01-27 DIAGNOSIS — F329 Major depressive disorder, single episode, unspecified: Secondary | ICD-10-CM

## 2015-01-27 NOTE — Progress Notes (Signed)
Reason for follow-up:  Last seen in April, 2016 by me.  Since that time, reports she is doing reasonably well.  Her daughter suggested she needed to touch base with me and Baelyn agreed.   Issues discussed:  Her husband of 27 years died in 11-22-2022.  They were separated but got along well.  This brought back memories of Lisha.  [name] has been connected to various mental health providers.  Most recently at a group home but jumped out a window and "broke her back."  She is no longer able to stay there.  Plans to go to [facility] for a while but unknown how long.  The time without her in the house has been "peaceful."  Despite all the interventions, it does not appear that Chad has improved functionally at all according to Tahjae's report.  Last issue discussed:  Mindless binge eating.  Belissa is concerned about weight gain.  Identified goals:  The eating was what she wanted to focus on the most.  As best I can tell, she uses mindless eating as a way to distract from uncomfortable thoughts and feelings.  Mapped out a plan around this with a short follow-up interval to see what she learned.  See patient instructions for further plan.

## 2015-01-27 NOTE — Assessment & Plan Note (Addendum)
Mood is reported largely as reasonable for her despite the significant chronic stressors.  Affect is within normal limits.  Not overly negative in her thinking with the exception of talking about [name].  Continues with Abilify and Zoloft.  Takes Xanax about two times a week.    Eating is likely tied to negative emotional experiences.  She agreed.  Mapped out a plan to see if she can make a shift to other coping.

## 2015-01-27 NOTE — Patient Instructions (Signed)
Please schedule a follow-up for:  February 2nd at 9:00 am. This is the plan you came up with: 1. Keep the bad stuff out of the house.  May need to get buy in from your brother to help you with this. 2. When you find yourself in the kitchen, ask yourself:  "Am I hungry?"  If the answer is no, ask, "What do I need?"  The answer is not food. 3. Things you can do instead of eating:  Computer games, puzzles, reading, instrumental music, and belly breathing (diaphragmatic breathing).  There are you tube relaxation videos that may be helpful.  I'd be interested what you thought of one of those. 4. We will check back in [place] so you don't need to do this forever.  Just two weeks.  Let's see how that goes and what you learn.

## 2015-01-28 ENCOUNTER — Other Ambulatory Visit: Payer: Self-pay | Admitting: *Deleted

## 2015-01-28 MED ORDER — ALBUTEROL SULFATE HFA 108 (90 BASE) MCG/ACT IN AERS: 2.0000 | INHALATION_SPRAY | Freq: Four times a day (QID) | RESPIRATORY_TRACT | Status: DC | PRN

## 2015-02-08 ENCOUNTER — Other Ambulatory Visit: Payer: Self-pay | Admitting: *Deleted

## 2015-02-08 MED ORDER — ALPRAZOLAM 1 MG PO TABS: ORAL_TABLET | ORAL | Status: DC

## 2015-02-08 NOTE — Telephone Encounter (Signed)
Prescription left at front office desk.

## 2015-02-09 NOTE — Telephone Encounter (Signed)
Pt informed. Zimmerman Rumple, Monicka Cyran D, CMA  

## 2015-02-10 ENCOUNTER — Ambulatory Visit (INDEPENDENT_AMBULATORY_CARE_PROVIDER_SITE_OTHER): Payer: Medicare Other | Admitting: Psychology

## 2015-02-10 DIAGNOSIS — E669 Obesity, unspecified: Secondary | ICD-10-CM | POA: Diagnosis not present

## 2015-02-10 DIAGNOSIS — F329 Major depressive disorder, single episode, unspecified: Secondary | ICD-10-CM | POA: Diagnosis not present

## 2015-02-10 DIAGNOSIS — F32A Depression, unspecified: Secondary | ICD-10-CM

## 2015-02-10 NOTE — Patient Instructions (Signed)
Please schedule a follow-up for:  February 16th at 9:00.   Congratulations on stopping your binge eating.  That is a big deal. We talked a lot about regular meals and the importance of it in healthy weight and also with your medications. You agreed to keep a food diary for at least a week.  I would be interested to see what types of patterns you find.

## 2015-02-10 NOTE — Assessment & Plan Note (Signed)
Binge eating has decreased per patient report.  Patient thought keeping a food diary was reasonable.  Discussed barriers.  She was confident she could overcome them.  Also discussed eating regular meals (including breakfast).  This is made more difficult secondary to nausea in the morning after she forces herself to eat.  Will continue to discuss when we get more data via the food diary.

## 2015-02-10 NOTE — Progress Notes (Signed)
Reason for follow-up:  Continued work on eating habits as well as stress management regarding granddaughter living with her.  Issues discussed:  Patient stated that she has decreased all binge eating in the last two weeks with the main strategy being not having the food in the house.  She noted some alternative behaviors she employs when experiencing uncomfortable feelings (computer games, resting).  She remains concerned about weight gain and mentioned that she occasionally goes all day without eating.  Discussed interest in making a change.  With regards to [facility], she is back in the home since Monday but patient is awaiting a call to get her placed at [facility] (treatment center near [facility]). She expects she will be out of the house again for a month or more.    Identified goals:  Keep food diary to enhance mindfulness and to note any patterns.

## 2015-02-10 NOTE — Assessment & Plan Note (Addendum)
Seems stable which is not to say remitted.  Stress of having [place] back in the house seems to deplete energy and increase irritability.  Affect was within normal limits today for her.  Will continue to monitor.

## 2015-02-23 ENCOUNTER — Encounter: Payer: Self-pay | Admitting: Family Medicine

## 2015-02-23 ENCOUNTER — Ambulatory Visit (INDEPENDENT_AMBULATORY_CARE_PROVIDER_SITE_OTHER): Payer: Medicare Other | Admitting: Family Medicine

## 2015-02-23 VITALS — BP 152/94 | HR 90 | Temp 98.3°F | Wt 248.8 lb

## 2015-02-23 DIAGNOSIS — M545 Low back pain, unspecified: Secondary | ICD-10-CM

## 2015-02-23 DIAGNOSIS — I10 Essential (primary) hypertension: Secondary | ICD-10-CM | POA: Diagnosis not present

## 2015-02-23 DIAGNOSIS — F411 Generalized anxiety disorder: Secondary | ICD-10-CM | POA: Diagnosis not present

## 2015-02-23 DIAGNOSIS — M549 Dorsalgia, unspecified: Secondary | ICD-10-CM | POA: Insufficient documentation

## 2015-02-23 MED ORDER — KETOROLAC TROMETHAMINE 30 MG/ML IJ SOLN: 30.0000 mg | Freq: Once | INTRAMUSCULAR | Status: DC

## 2015-02-23 MED ORDER — ONDANSETRON HCL 4 MG PO TABS: 4.0000 mg | ORAL_TABLET | Freq: Three times a day (TID) | ORAL | Status: DC | PRN

## 2015-02-23 MED ORDER — HYDROCODONE-ACETAMINOPHEN 10-325 MG PO TABS: 1.0000 | ORAL_TABLET | Freq: Three times a day (TID) | ORAL | Status: DC | PRN

## 2015-02-23 MED ORDER — KETOROLAC TROMETHAMINE 30 MG/ML IJ SOLN
30.0000 mg | Freq: Once | INTRAMUSCULAR | Status: AC
  Administered 2015-02-23: 30 mg via INTRAMUSCULAR

## 2015-02-23 MED ORDER — METHYLPREDNISOLONE ACETATE 40 MG/ML IJ SUSP
40.0000 mg | Freq: Once | INTRAMUSCULAR | Status: AC
  Administered 2015-02-23: 40 mg via INTRAMUSCULAR

## 2015-02-23 MED ORDER — ALPRAZOLAM 1 MG PO TABS: ORAL_TABLET | ORAL | Status: DC

## 2015-02-23 NOTE — Assessment & Plan Note (Signed)
-   Follows with Behavioral Health - Xanax refilled

## 2015-02-23 NOTE — Patient Instructions (Signed)
Thank you so much for coming to visit today! We will give you a shot of Steroids and Pain Medication today. I will also give you some oral pain medication you can take. Please take as prescribed. We will obtain updated xrays as well. I will refill the requested medications.   Thanks again! Dr. Gerlean Ren

## 2015-02-23 NOTE — Assessment & Plan Note (Signed)
-   Last films 2011. Obtain updated films of lumbar spine. - Steroid-Toradol shot today  - Refill of Vicodin given. Notes that this will not be a chronic medication. - Follow up pending xray results

## 2015-02-23 NOTE — Progress Notes (Signed)
Subjective:     Patient ID: Stacy Campbell, female   DOB: 1956-01-05, 60 y.o.   MRN: [id number]  HPI BACK PAIN  Back pain began 2 months ago. Pain is described as sharp, achy. Patient has tried Tramadol, TENS unit, Lyrica, Flexeril, and Ibuprofen without relief for flares in past. Pain does not radiate. History of trauma or injury: None Prior history of similar pain: Yes. First documented 2008 on lumbar film. Affects ADLs, specifically bathing, standing, shopping Worse with standing and rising from sitting  Symptoms Incontinence of bowel or bladder:  No Numbness of leg: No Fever: No Rest or Night pain: No Weight Loss:  No Rash: No  Smoking Status noted.  Review of Systems Per HPI. Other systems negative.    Objective:   Physical Exam  Constitutional: She appears well-developed and well-nourished. No distress.  Musculoskeletal:  Muscle strength 5/5 in lower extremities. Normal ROM of hips bilaterally. Tenderness along midline of lumbar and sacral spine. Tenderness over Piriformis muscle bilaterally.       Assessment and Plan:     HYPERTENSION, BENIGN SYSTEMIC - Elevated to 160/67 initially, improved to 152/94. Note visit for acute pain, so may be elevated secondary to pain. - Blood pressure at goal 01/21/15 - Continue to monitor. If remains elevated, consider medication changes  Anxiety state - Follows with Behavioral Health - Xanax refilled  Back pain - Last films 2011. Obtain updated films of lumbar spine. - Steroid-Toradol shot today  - Refill of Vicodin given. Notes that this will not be a chronic medication. - Follow up pending xray results

## 2015-02-23 NOTE — Assessment & Plan Note (Addendum)
-   Elevated to 160/67 initially, improved to 152/94. Note visit for acute pain, so may be elevated secondary to pain. - Blood pressure at goal 01/21/15 - Continue to monitor. If remains elevated, consider medication changes

## 2015-02-24 ENCOUNTER — Telehealth: Payer: Self-pay | Admitting: Psychology

## 2015-02-24 ENCOUNTER — Ambulatory Visit: Payer: Medicare Other | Admitting: Psychology

## 2015-02-24 NOTE — Telephone Encounter (Signed)
Patient missed her appointment this morning.  This is not typical for her.  Noted visit yesterday.  Called and left a VM to check-in.

## 2015-03-02 ENCOUNTER — Other Ambulatory Visit: Payer: Self-pay | Admitting: *Deleted

## 2015-03-03 MED ORDER — PROMETHAZINE HCL 25 MG PO TABS: 25.0000 mg | ORAL_TABLET | Freq: Three times a day (TID) | ORAL | Status: DC | PRN

## 2015-03-14 ENCOUNTER — Ambulatory Visit (INDEPENDENT_AMBULATORY_CARE_PROVIDER_SITE_OTHER): Payer: Medicare Other | Admitting: Psychology

## 2015-03-14 DIAGNOSIS — F32A Depression, unspecified: Secondary | ICD-10-CM

## 2015-03-14 DIAGNOSIS — F411 Generalized anxiety disorder: Secondary | ICD-10-CM

## 2015-03-14 DIAGNOSIS — F329 Major depressive disorder, single episode, unspecified: Secondary | ICD-10-CM

## 2015-03-14 NOTE — Assessment & Plan Note (Signed)
This seems to have taken a backseat to the anxiety she is experiencing right now with the move.  She sounds motivated to get things packed and is doing that.  Will continue to monitor.

## 2015-03-14 NOTE — Assessment & Plan Note (Signed)
This medication works for her.  She says she takes it as needed but reports she uses 60 pills a month.  We discussed the advantages and disadvantages especially given the fact she takes narcotic pain medicine on occasion.  She seems open to alternative approaches but this is in the context of having the medication.  See patient instructions for further plan.

## 2015-03-14 NOTE — Patient Instructions (Signed)
Please schedule a follow-up for:  March 16th at 9:00.   It starts with breathing.  Simple but true.  When overwhelmed - get focused on your breath as simple as in and out. When not overwhelmed, keep making progress on the things you can control - packing, looking for a place, getting [name] situated. Medicine does not make bad situations better.  This is a bad situation and it will (hopefully) get better in a little while.  Your job is to hang on.

## 2015-03-14 NOTE — Progress Notes (Signed)
Reason for follow-up:  A week ago Saturday she learned that she has 30 days to vacate the house where she has been living since 2002.  This combined with her stress with [name] has led to what sounds like panic attacks / states.   Issues discussed:  As above.  She notes consistent periods of time since the news where she feels anxious, sick on her stomach, and with weakness in her left arm (especially).  She has considered going to the ED but believes it is anxiety.    Identified goals:  Recognize that she should be feeling anxious.  Manage to really overwhelming times through breathing.  Problem solve / control what can be controlled during the less anxious times.

## 2015-03-24 ENCOUNTER — Ambulatory Visit (INDEPENDENT_AMBULATORY_CARE_PROVIDER_SITE_OTHER): Payer: Medicare Other | Admitting: Psychology

## 2015-03-24 DIAGNOSIS — F32A Depression, unspecified: Secondary | ICD-10-CM

## 2015-03-24 DIAGNOSIS — F411 Generalized anxiety disorder: Secondary | ICD-10-CM

## 2015-03-24 DIAGNOSIS — F329 Major depressive disorder, single episode, unspecified: Secondary | ICD-10-CM

## 2015-03-24 NOTE — Assessment & Plan Note (Addendum)
She appears flat and tired.  She continues to use coping statements however.  Sounds like she is getting done what she can around her house right now.  Will continue to monitor and provide support via brief therapy sessions.    PHQ-9 reviewed with patient.  17 and and extremely difficult.

## 2015-03-24 NOTE — Progress Notes (Signed)
Reason for follow-up:  Continued efforts at managing acute and chronic stress.  Issues discussed:  Housing situation.  No resolution yet.  Family members are giving advice and this is overwhelming.  Has found a house that is acceptable and is waiting to hear back.  Two more weeks until the deadline.    Took Stacy Campbell to [facility] and she was refused secondary to back issues and how their restraint procedures might impact that.  Kayelynn is in a holding pattern again with Stacy Campbell.    Identified goals:  Looked at whether she could attempt calm, rational limit setting with her family so that she doesn't have as much "noise" around when trying to make a housing situation.  Thus far she has lashed out in frustration which has made things worse.  Other than that, she is to continue moving forward on the housing situation by packing and securing a place.

## 2015-03-24 NOTE — Assessment & Plan Note (Signed)
A little better the last two days per her report.  This is surprising to me given the things she has been dealing with.  She does admit she is more angry than anxious so perhaps she is dealing with her fear and worry by discharging / blaming others.

## 2015-04-07 ENCOUNTER — Ambulatory Visit (INDEPENDENT_AMBULATORY_CARE_PROVIDER_SITE_OTHER): Payer: Medicare Other | Admitting: Psychology

## 2015-04-07 DIAGNOSIS — F32A Depression, unspecified: Secondary | ICD-10-CM

## 2015-04-07 DIAGNOSIS — F329 Major depressive disorder, single episode, unspecified: Secondary | ICD-10-CM

## 2015-04-07 NOTE — Assessment & Plan Note (Addendum)
PHQ-9 report is a little improved since last visit (12 today down from 17).  Affect was variable and within normal limits.  Based on documentation from last visit, suspect there is a bit of improvement in mood.  She smiled on occasion today and laughed.  Stacy Campbell's relationship with her granddaughter seems completely devoid of anything healthy or positive.  The best thing for both is likely to be away from each other as this unhealthy dynamic seems to further entrench each one of them in a bad place.  Will continue to monitor.  Stacy Campbell's birthday is [birth year] 4[birth year] and the monthly anniversary of her death is the 53th (I think that is right) so Stacy Campbell and her family expect this to be a difficult time.  She elected to schedule for April 13th at 9:00.  After this move, will need to determine if there is anything else she needs to actively work on.

## 2015-04-07 NOTE — Progress Notes (Signed)
Reason for follow-up:  Continued support / stress management for chronic and acute issues.  Issues discussed:  Housing situation.  She has a place (not the first one she wanted) and is packed.  Will start moving tonight and hopes to be finished no later than Saturday.  Her brother, his two grandchildren, [name] and Chad are all moving with her.  Continues to be in a holding pattern with [name].   Identified goals:  Get [name] out of her room / bed with this move.  Regarding [name], if she doesn't have anything nice to say, say nothing at all.

## 2015-04-08 ENCOUNTER — Other Ambulatory Visit: Payer: Self-pay | Admitting: Family Medicine

## 2015-04-08 DIAGNOSIS — M545 Low back pain: Secondary | ICD-10-CM

## 2015-04-08 MED ORDER — ZOLPIDEM TARTRATE 10 MG PO TABS: 10.0000 mg | ORAL_TABLET | Freq: Every evening | ORAL | Status: DC | PRN

## 2015-04-11 DIAGNOSIS — K7460 Unspecified cirrhosis of liver: Secondary | ICD-10-CM | POA: Diagnosis not present

## 2015-04-11 DIAGNOSIS — Z23 Encounter for immunization: Secondary | ICD-10-CM | POA: Diagnosis not present

## 2015-04-12 ENCOUNTER — Telehealth: Payer: Self-pay | Admitting: Psychology

## 2015-04-12 DIAGNOSIS — B1920 Unspecified viral hepatitis C without hepatic coma: Secondary | ICD-10-CM | POA: Diagnosis not present

## 2015-04-12 DIAGNOSIS — K7460 Unspecified cirrhosis of liver: Secondary | ICD-10-CM | POA: Diagnosis not present

## 2015-04-12 DIAGNOSIS — Z1211 Encounter for screening for malignant neoplasm of colon: Secondary | ICD-10-CM | POA: Diagnosis not present

## 2015-04-12 NOTE — Telephone Encounter (Signed)
Called and left a VM canceling the appointment on the 13th and asking her to call me back to reschedule.

## 2015-04-12 NOTE — Telephone Encounter (Signed)
Pt called because she needs a refill on her Hydrocodone left up front for pick up. She also needs the orders put in again for the x-ray of her back. She did go to [facility] to have this done but they told her that they didn't see any orders for this. Please call patient when she can come and pick up her medication. jw

## 2015-04-13 ENCOUNTER — Other Ambulatory Visit: Payer: Self-pay | Admitting: Gastroenterology

## 2015-04-13 DIAGNOSIS — K7460 Unspecified cirrhosis of liver: Secondary | ICD-10-CM

## 2015-04-14 NOTE — Telephone Encounter (Signed)
Needs appointment to be seen. We have discussed that opioids are not a chronic medication. Orders for lumbar films reordered.

## 2015-04-15 ENCOUNTER — Ambulatory Visit (HOSPITAL_COMMUNITY)
Admission: RE | Admit: 2015-04-15 | Discharge: 2015-04-15 | Disposition: A | Discharge status: Home / Self Care | Payer: Medicare Other | Source: Ambulatory Visit | Attending: Family Medicine | Admitting: Family Medicine

## 2015-04-15 DIAGNOSIS — M545 Low back pain: Secondary | ICD-10-CM

## 2015-04-15 DIAGNOSIS — M419 Scoliosis, unspecified: Secondary | ICD-10-CM | POA: Insufficient documentation

## 2015-04-15 DIAGNOSIS — M8580 Other specified disorders of bone density and structure, unspecified site: Secondary | ICD-10-CM | POA: Diagnosis not present

## 2015-04-15 DIAGNOSIS — M47816 Spondylosis without myelopathy or radiculopathy, lumbar region: Secondary | ICD-10-CM | POA: Diagnosis not present

## 2015-04-15 DIAGNOSIS — I700 Atherosclerosis of aorta: Secondary | ICD-10-CM | POA: Diagnosis not present

## 2015-04-18 ENCOUNTER — Other Ambulatory Visit: Payer: Self-pay | Admitting: *Deleted

## 2015-04-18 DIAGNOSIS — L0101 Non-bullous impetigo: Secondary | ICD-10-CM | POA: Diagnosis not present

## 2015-04-18 DIAGNOSIS — L2081 Atopic neurodermatitis: Secondary | ICD-10-CM | POA: Diagnosis not present

## 2015-04-18 DIAGNOSIS — L281 Prurigo nodularis: Secondary | ICD-10-CM | POA: Diagnosis not present

## 2015-04-19 ENCOUNTER — Ambulatory Visit (INDEPENDENT_AMBULATORY_CARE_PROVIDER_SITE_OTHER): Payer: Medicare Other | Admitting: Family Medicine

## 2015-04-19 ENCOUNTER — Encounter: Payer: Self-pay | Admitting: Family Medicine

## 2015-04-19 VITALS — BP 172/88 | HR 89 | Temp 98.7°F | Ht 65.0 in | Wt 251.9 lb

## 2015-04-19 DIAGNOSIS — F411 Generalized anxiety disorder: Secondary | ICD-10-CM | POA: Diagnosis not present

## 2015-04-19 DIAGNOSIS — G8929 Other chronic pain: Secondary | ICD-10-CM | POA: Diagnosis not present

## 2015-04-19 DIAGNOSIS — M549 Dorsalgia, unspecified: Secondary | ICD-10-CM

## 2015-04-19 DIAGNOSIS — M545 Low back pain: Secondary | ICD-10-CM | POA: Diagnosis not present

## 2015-04-19 DIAGNOSIS — I10 Essential (primary) hypertension: Secondary | ICD-10-CM

## 2015-04-19 LAB — LIPID PANEL
CHOLESTEROL: 166 mg/dL (ref 125–200)
HDL: 33 mg/dL — ABNORMAL LOW (ref >=46)
LDL CALC: 99 mg/dL (ref <130)
TRIGLYCERIDES: 170 mg/dL — AB (ref <150)
Total CHOL/HDL Ratio: 5 Ratio (ref <=5.0)
VLDL: 34 mg/dL — ABNORMAL HIGH (ref <30)

## 2015-04-19 MED ORDER — KETOROLAC TROMETHAMINE 30 MG/ML IJ SOLN
30.0000 mg | Freq: Once | INTRAMUSCULAR | Status: AC
  Administered 2015-04-19: 30 mg via INTRAMUSCULAR

## 2015-04-19 MED ORDER — ALPRAZOLAM 1 MG PO TABS: ORAL_TABLET | ORAL | Status: DC

## 2015-04-19 MED ORDER — METHYLPREDNISOLONE ACETATE 40 MG/ML IJ SUSP
40.0000 mg | Freq: Once | INTRAMUSCULAR | Status: AC
  Administered 2015-04-19: 40 mg via INTRAMUSCULAR

## 2015-04-19 MED ORDER — LISINOPRIL 40 MG PO TABS: 40.0000 mg | ORAL_TABLET | Freq: Every day | ORAL | Status: DC

## 2015-04-19 MED ORDER — HYDROCODONE-ACETAMINOPHEN 10-325 MG PO TABS: 1.0000 | ORAL_TABLET | Freq: Three times a day (TID) | ORAL | Status: DC | PRN

## 2015-04-19 NOTE — Patient Instructions (Addendum)
Thank you so much for coming to visit today! I'm sorry you are having back pain again. We will give you a shot of steroids and pain medication, which seemed to help last time. I will also give you a 1 week prescription of pain medication.  Your blood pressure was elevated today. I will increase you Lisinopril to 40mg  daily. You may take two of the 20mg  tablets daily until you run out.  We will check your cholesterol today. I will contact you concerning the results.  Please follow up in 2 weeks to recheck your blood pressure. This may be a nursing visit if preferable or you can ask if they can check it at your appointment with Dr. Gwenlyn Saran.   Thanks again! Dr. Gerlean Ren

## 2015-04-19 NOTE — Progress Notes (Signed)
Subjective:     Patient ID: Stacy Campbell, female   DOB: [birth year], 60 y.o.   MRN: [id number]  HPI Stacy Campbell is a 60yo female presenting today for acute back pain. - Long history of back pain with acute exacerbations. First documented in 2008.  - Historically few medications help relieve pain. Has tried Tramadol, TENS unit, Lyrica, Flexeril, Ibuprofen, and Tylenol without relief. - Last flare 2/15. Improved with Toradol and Steroid injections and short course opioids. - States with above treatment, back pain resolved for the first time in months. Flared back up last Thursday (4/6). - Has been in the process of moving and doesn't remember directly injuring her back but believes this is what resulted in back pain. Has had to halt moving process due to significant back pain. - Reports problems with ADLS due to back pain, including chores, travel, and playing with grandkids - Pain located in mid lower back and to paraspinal muscles on both sides - Denies radiation down legs, numbness, tingling, weakness, saddle anesthesia, urinary or fecal incontinence. - Lumbar films from 4/7 personally reviewed. Showed diffuse degeneration with mild concave left scoliosis (question scoliosis vs. Positioning) - Reports worsening anxiety lately, which she has been following Dr. Gwenlyn Campbell for. Reports visits with Dr. Gwenlyn Campbell has been helping significantly. Requests refill of Ativan.. - Last cholesterol 2011. - Currently prescribed Norvasc 10mg , Coreg 25mg , HCTZ 25mg , Lisinopril 20mg .  Review of Systems Per HPI. Other systems negative.    Objective:   Physical Exam  Constitutional: She appears well-developed and well-nourished. No distress.  HENT:  Head: Normocephalic and atraumatic.  Cardiovascular: Normal rate and regular rhythm.  Exam reveals no gallop and no friction rub.   No murmur heard. Pulmonary/Chest: Effort normal. No respiratory distress. She has no wheezes.  Musculoskeletal:  Tenderness  along lumbar spine and paraspinal muscles bilaterally and piriformis muscles bilaterally. Pain with straight leg raises bilaterally. No saddle anesthesia. Muscles strength 5/5 in lower extremities bilaterally.  Psychiatric: She has a normal mood and affect. Her behavior is normal.       Assessment and Plan:     HYPERTENSION, BENIGN SYSTEMIC - Initially elevated to 180/83. 172/88 on recheck.  - Possibly elevated due to acute flare of pain, however second visit noted to be elevated. - Will increase Lisinopril to 40mg  - Appointment scheduled with Dr. Gwenlyn Campbell on 4/24. Recommend nurse's visit on same date to check BP  Anxiety state - Xanax refilled - Following with Dr. Gwenlyn Campbell  Chronic low back pain - Shot of Depo Medrol 40 and Toradol 30 given - 1week of Hydrocodone prescribed given effect on ADLs. Discussed that further opioids would not be given for this episode of acute pain. - Follow up if no improvement.

## 2015-04-19 NOTE — Telephone Encounter (Signed)
Had apt with Dr. Gerlean Ren today 04/19/2015. Ottis Stain, CMA

## 2015-04-20 ENCOUNTER — Ambulatory Visit
Admission: RE | Admit: 2015-04-20 | Discharge: 2015-04-20 | Disposition: A | Discharge status: Home / Self Care | Payer: Medicare Other | Source: Ambulatory Visit | Attending: Gastroenterology | Admitting: Gastroenterology

## 2015-04-20 DIAGNOSIS — K7460 Unspecified cirrhosis of liver: Secondary | ICD-10-CM | POA: Diagnosis not present

## 2015-04-20 NOTE — Assessment & Plan Note (Signed)
-   Initially elevated to 180/83. 172/88 on recheck.  - Possibly elevated due to acute flare of pain, however second visit noted to be elevated. - Will increase Lisinopril to 40mg  - Appointment scheduled with Dr. Gwenlyn Saran on 4/24. Recommend nurse's visit on same date to check BP

## 2015-04-20 NOTE — Assessment & Plan Note (Signed)
-   Xanax refilled - Following with Dr. Gwenlyn Saran

## 2015-04-20 NOTE — Assessment & Plan Note (Deleted)
-   Xanax refilled - Following with Dr. Gwenlyn Saran

## 2015-04-20 NOTE — Assessment & Plan Note (Signed)
-   Shot of Depo Medrol 40 and Toradol 30 given - 1week of Hydrocodone prescribed given effect on ADLs. Discussed that further opioids would not be given for this episode of acute pain. - Follow up if no improvement.

## 2015-04-21 ENCOUNTER — Telehealth: Payer: Self-pay | Admitting: Family Medicine

## 2015-04-21 ENCOUNTER — Ambulatory Visit: Payer: Medicare Other | Admitting: Psychology

## 2015-04-21 ENCOUNTER — Encounter: Payer: Self-pay | Admitting: Family Medicine

## 2015-04-21 NOTE — Telephone Encounter (Signed)
Contacted concerning lab results. States she notes significant improvement with back pain with prescribed course.

## 2015-05-02 ENCOUNTER — Other Ambulatory Visit: Payer: Self-pay | Admitting: *Deleted

## 2015-05-02 ENCOUNTER — Ambulatory Visit (INDEPENDENT_AMBULATORY_CARE_PROVIDER_SITE_OTHER): Payer: Medicare Other | Admitting: Psychology

## 2015-05-02 ENCOUNTER — Ambulatory Visit (INDEPENDENT_AMBULATORY_CARE_PROVIDER_SITE_OTHER): Payer: Medicare Other | Admitting: *Deleted

## 2015-05-02 VITALS — BP 168/92 | HR 93

## 2015-05-02 DIAGNOSIS — F32A Depression, unspecified: Secondary | ICD-10-CM

## 2015-05-02 DIAGNOSIS — F329 Major depressive disorder, single episode, unspecified: Secondary | ICD-10-CM

## 2015-05-02 DIAGNOSIS — Z136 Encounter for screening for cardiovascular disorders: Secondary | ICD-10-CM

## 2015-05-02 DIAGNOSIS — Z0130 Encounter for examination of blood pressure without abnormal findings: Secondary | ICD-10-CM

## 2015-05-02 DIAGNOSIS — I10 Essential (primary) hypertension: Secondary | ICD-10-CM

## 2015-05-02 NOTE — Progress Notes (Signed)
   Patient in nurse clinic for blood pressure check.  Patient denies any chest pain, SOB, headache, dizziness.  Patient did not take any of her blood pressure medications this morning.  Advised patient to make another nurse visit for blood pressure check and to take her blood pressure medications beforehand.  Patient stated understanding and appointment scheduled for 05/09/15.  Derl Barrow, RN  Today's Vitals   05/02/15 0956 05/02/15 1012  BP: 170/92 168/92  Pulse: 93   PainSc: 0-No pain

## 2015-05-02 NOTE — Assessment & Plan Note (Signed)
Affect is normal in range.  Thoughts are clear and goal directed.  Somewhat fixated on [place] again.  Seems like her management of her daughter's birthday and anniversary of her death is better than a year ago - not as fixated on this as she was last year.  Her PHQ-9 today was an 8 which is down from 17 from just over a month ago.  Still feeling bad about herself or like she is a failure nearly everyday.  Will perhaps look into this further next visit.  Follow-up 5/22 at 9:00 per her request.

## 2015-05-02 NOTE — Progress Notes (Signed)
Reason for follow-up:  "I feel better when I come."  She has also been identified some specific goals for herself.  Last time it was to get her grandson sleeping in his own space with her recent move and to refrain from reacting negatively to [facility] as much as possible.    Issues discussed:  Above:  Pleas Stacy Campbell is in his own bed.  She plans to continue to keep it this way.  She has been less successful where Stacy Campbell is concerned.  She recognizes that [employer] off of Stacy Campbell's negative comments (it is reinforcing) and that it doesn't leave Stacy Campbell in a better place either.  Still, curbing her reaction is an incredible challenge.  Move went well.  House is a step in the right direction but not ideal.  Could not get into houses that were more desirable secondary to poor credit.    Did not have much to say regarding her daughter's birthday and anniversary of her death despite my asking.  Did not go to the [place].    Identified goals:  She wants to get her credit score up.  She talked to the bank and they issued her a credit card ($300 limit) and instructed her to use it and pay it off every month.  We discussed the Neurosurgeon at [facility].  She may look into this.

## 2015-05-03 MED ORDER — HYDROCHLOROTHIAZIDE 25 MG PO TABS: 25.0000 mg | ORAL_TABLET | Freq: Every day | ORAL | Status: DC

## 2015-05-05 ENCOUNTER — Telehealth: Payer: Self-pay | Admitting: Family Medicine

## 2015-05-05 MED ORDER — CARVEDILOL 25 MG PO TABS: 25.0000 mg | ORAL_TABLET | Freq: Two times a day (BID) | ORAL | Status: DC

## 2015-05-05 NOTE — Telephone Encounter (Signed)
Contacted concerning blood pressure. To follow up next week for blood pressure check.

## 2015-05-09 ENCOUNTER — Ambulatory Visit (INDEPENDENT_AMBULATORY_CARE_PROVIDER_SITE_OTHER): Payer: Medicare Other | Admitting: *Deleted

## 2015-05-09 VITALS — BP 136/72 | HR 80

## 2015-05-09 DIAGNOSIS — Z0130 Encounter for examination of blood pressure without abnormal findings: Secondary | ICD-10-CM

## 2015-05-09 DIAGNOSIS — Z136 Encounter for screening for cardiovascular disorders: Secondary | ICD-10-CM

## 2015-05-16 ENCOUNTER — Other Ambulatory Visit: Payer: Self-pay | Admitting: *Deleted

## 2015-05-16 MED ORDER — AMLODIPINE BESYLATE 10 MG PO TABS: 10.0000 mg | ORAL_TABLET | Freq: Every day | ORAL | Status: DC

## 2015-05-16 MED ORDER — MONTELUKAST SODIUM 10 MG PO TABS: 10.0000 mg | ORAL_TABLET | Freq: Every day | ORAL | Status: DC

## 2015-05-23 DIAGNOSIS — L299 Pruritus, unspecified: Secondary | ICD-10-CM | POA: Diagnosis not present

## 2015-05-23 DIAGNOSIS — L400 Psoriasis vulgaris: Secondary | ICD-10-CM | POA: Diagnosis not present

## 2015-05-23 DIAGNOSIS — L4052 Psoriatic arthritis mutilans: Secondary | ICD-10-CM | POA: Diagnosis not present

## 2015-05-23 DIAGNOSIS — L0100 Impetigo, unspecified: Secondary | ICD-10-CM | POA: Diagnosis not present

## 2015-05-30 ENCOUNTER — Ambulatory Visit (INDEPENDENT_AMBULATORY_CARE_PROVIDER_SITE_OTHER): Payer: Medicare Other | Admitting: Psychology

## 2015-05-30 DIAGNOSIS — F32A Depression, unspecified: Secondary | ICD-10-CM

## 2015-05-30 DIAGNOSIS — F329 Major depressive disorder, single episode, unspecified: Secondary | ICD-10-CM

## 2015-05-30 NOTE — Patient Instructions (Signed)
Please schedule a follow-up for:  June 19th at 10:00.   Consider talking to the [facility] people about an honest thing you can say to [facility] when she says, "I love you."  The hope is that you would start to create a more honest, healthy relationship.

## 2015-05-30 NOTE — Progress Notes (Signed)
Reason for follow-up:  She "feels better" when she follows up.  Long standing history of depression and chronic health conditions.  Attempting to idenfity and work toward specific goals.    Issues discussed:  [place].  She is at [facility] in [facility] and has been for nearly a month.  Our full time was spent discussing this.    Identified goals:  Talking with [place] about how to communicate more honestly with Friend.  Currently, she feels forced to say things that aren't necessarily true.

## 2015-05-30 NOTE — Assessment & Plan Note (Signed)
No report of mood although PHQ-9 indicates feeling down or depressed most of the days in the last two weeks.  Affect is within normal limits today.  Thoughts are clear and goal directed.  No evidence of SI / HI.  She is breathing easier with [name] out of the house.  Has regular phone calls as part of [name]'s treatment.  Continues to be a source of stress although more removed now.    See patient instructions for further plan.

## 2015-06-23 DIAGNOSIS — L290 Pruritus ani: Secondary | ICD-10-CM | POA: Diagnosis not present

## 2015-06-23 DIAGNOSIS — L299 Pruritus, unspecified: Secondary | ICD-10-CM | POA: Diagnosis not present

## 2015-06-23 DIAGNOSIS — L0100 Impetigo, unspecified: Secondary | ICD-10-CM | POA: Diagnosis not present

## 2015-06-23 DIAGNOSIS — Z79899 Other long term (current) drug therapy: Secondary | ICD-10-CM | POA: Diagnosis not present

## 2015-06-23 DIAGNOSIS — L400 Psoriasis vulgaris: Secondary | ICD-10-CM | POA: Diagnosis not present

## 2015-06-27 ENCOUNTER — Ambulatory Visit (INDEPENDENT_AMBULATORY_CARE_PROVIDER_SITE_OTHER): Payer: Medicare Other | Admitting: Psychology

## 2015-06-27 DIAGNOSIS — F32A Depression, unspecified: Secondary | ICD-10-CM

## 2015-06-27 DIAGNOSIS — F329 Major depressive disorder, single episode, unspecified: Secondary | ICD-10-CM

## 2015-06-27 NOTE — Patient Instructions (Signed)
Please schedule a follow-up for:  July 17th at 10:00. We talked about Stacy Campbell and his dad.  You agreed changing your approach here might be wise.  When Stacy Campbell gets let down by his dad - saying things like, "This is not your fault." and "You deserve better than this." can be helpful.  Anything in your words or actions that say, "I told you so" or "what did you expect?!?" is not helpful for him.  : - )   Keep watch of your mood on the [place]. If you notice your depression getting worse, call me and let me know.

## 2015-06-27 NOTE — Assessment & Plan Note (Signed)
Report of mood is euthymic.  Patient reports feeling more "peaceful" since [place] is out of the house.  She reports moving around a fair amount.  Has plans and is following through with them.  Overall, seems to be in a reasonably good place.  Will follow in one month or as needed.

## 2015-06-27 NOTE — Progress Notes (Signed)
Reason for follow-up:  Main thing patient brought up was [place].  Still in [place].  Doesn't plan on her coming home until end of July.  We touched briefly on this and then I focused our meeting on other things.  Of note, patient did report she talked with the providers at the treatment center about how she can respond to [facility] differently (more honestly).    Issues discussed:  [name] (age 60) and his relationship with his father.  We discussed advantages and disadvantages of patient's current approach and ways to tweak it.  Patient reported she thought it was important to change.  Cultural factors were examined as well.    Patient started on a new medication for psoriatic arthritis.  [place] Nail can make depression and suicidal ideation worse.  Patient noted three days in the last two weeks of depressed mood.  Denied suicidal ideation.  Thinks the medicine is helping.  Diarrhea was the biggest side effect thus far and that is resolving.    Had a good cry regarding Lisha.  Discussed what Deberah Castle would say about the situation with [name] if Deberah Castle were present today.  Patient does not think she would blame her for the outcome.  Identified goals:  Per patient instructions.  Shift her approach with [name].

## 2015-06-30 ENCOUNTER — Ambulatory Visit: Payer: Medicare Other | Admitting: Family Medicine

## 2015-07-01 ENCOUNTER — Ambulatory Visit (INDEPENDENT_AMBULATORY_CARE_PROVIDER_SITE_OTHER): Payer: Medicare Other | Admitting: Family Medicine

## 2015-07-01 ENCOUNTER — Encounter: Payer: Self-pay | Admitting: Family Medicine

## 2015-07-01 VITALS — BP 167/85 | HR 102 | Temp 98.0°F | Wt 236.4 lb

## 2015-07-01 DIAGNOSIS — G8929 Other chronic pain: Secondary | ICD-10-CM | POA: Diagnosis not present

## 2015-07-01 DIAGNOSIS — M545 Low back pain, unspecified: Secondary | ICD-10-CM

## 2015-07-01 MED ORDER — KETOROLAC TROMETHAMINE 30 MG/ML IJ SOLN
30.0000 mg | Freq: Once | INTRAMUSCULAR | Status: AC
  Administered 2015-07-01: 30 mg via INTRAMUSCULAR

## 2015-07-01 MED ORDER — HYDROCODONE-ACETAMINOPHEN 10-325 MG PO TABS: 1.0000 | ORAL_TABLET | Freq: Three times a day (TID) | ORAL | Status: DC | PRN

## 2015-07-01 MED ORDER — METHYLPREDNISOLONE ACETATE 40 MG/ML IJ SUSP
40.0000 mg | Freq: Once | INTRAMUSCULAR | Status: AC
  Administered 2015-07-01: 40 mg via INTRAMUSCULAR

## 2015-07-01 MED ORDER — PREDNISONE 20 MG PO TABS: ORAL_TABLET | ORAL | Status: DC

## 2015-07-01 NOTE — Assessment & Plan Note (Signed)
The patient is presenting with worsening chronic back pain for the last 4 days. She now endorses radiation of her pain into the left leg. She denies any recent trauma.  No red flags on exam or history. - Patient administered Depo-Medrol 40 mg and Toradol 30 mg IM today in clinic -Patient provided a 6 day steroid taper -Patient given a one-week supply of hydrocodone and advised to continue to follow-up with her PCP next week. I reiterated that typically we tend to hold off on narcotics for back pain such as hers.  -The patient may benefit from physical therapy in the future -Return precautions discussed -Patient to follow-up with her PCP in one week

## 2015-07-01 NOTE — Progress Notes (Signed)
Subjective: CC: back pain HPI: Patient is a 60 y.o. female with a past medical history of chronic lower back pain presenting to clinic today for a same day appt for back pain.   The patient has chronic lumbar back pain, however endorses worsening pain over the last week. She endorses lower back pain with some pain in her left buttocks.  Her pain is constant. The pain is sore and worsens with prolonged sitting or walking. Initially she notes the pain will radiate into her left leg down into her knee which is new for the last 4 days.  She denies any numbness or tingling. She denies any saddle paresthesias. No unilateral weakness. She has not lost bowel or urinary function.  She has had injections of Toradol and Depo-Medrol in the past with some improvement. She notes that a long time ago she had a steroid taper did not help much.  She notes that Norco tends to work both for her, however she has been out of this medication.  Gabapentin is on her medication regimen, however she notes that she is not currently taking gabapentin or Lyrica.  Social History: Former smoker   ROS: All other systems reviewed and are negative.  Past Medical History Patient Active Problem List   Diagnosis Date Noted  . Back pain 02/23/2015  . Leg cramps 10/17/2014  . Health care maintenance 10/17/2014  . DOE (dyspnea on exertion) 05/24/2014  . Pre-syncope 05/24/2014  . Rash 03/22/2014  . Knee pain, bilateral 10/08/2013  . 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 Allergic rhinitis 10/12/2012  . Hepatitis C 03/06/2011  . 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 Anxiety state 07/30/2007  . Obesity, Class II, BMI 35-39.9 03/07/2006  . Depression 03/07/2006  . HYPERTENSION, BENIGN SYSTEMIC 03/07/2006  . Chronic low back pain 03/07/2006  . Insomnia 03/07/2006    Medications- reviewed and updated Current Outpatient Prescriptions  Medication Sig Dispense Refill  . albuterol (PROVENTIL HFA;VENTOLIN HFA) 108 (90 Base)  MCG/ACT inhaler Inhale 2 puffs into the lungs every 6 (six) hours as needed for wheezing or shortness of breath. 1 Inhaler 2  . ALPRAZolam (XANAX) 1 MG tablet TAKE 1 TABLET BY MOUTH TWICE DAILY AS  NEEDED FOR ANXIETY 60 tablet 1  . amLODipine (NORVASC) 10 MG tablet Take 1 tablet (10 mg total) by mouth daily. 90 tablet 3  . Apremilast (OTEZLA) 30 MG TABS Take 30 mg by mouth. Per Dermatologist.    . ARIPiprazole (ABILIFY) 5 MG tablet Take 1 tablet (5 mg total) by mouth daily. 90 tablet 3  . benzonatate (TESSALON) 100 MG capsule Take 1 capsule (100 mg total) by mouth 3 (three) times daily as needed for cough. 30 capsule 3  . carvedilol (COREG) 25 MG tablet Take 1 tablet (25 mg total) by mouth 2 (two) times daily with a meal. 180 tablet 3  . fluticasone (FLONASE) 50 MCG/ACT nasal spray Place 2 sprays into both nostrils daily. 16 g 6  . gabapentin (NEURONTIN) 100 MG capsule Take 1 capsule (100 mg total) by mouth at bedtime. 30 capsule 0  . hydrochlorothiazide (HYDRODIURIL) 25 MG tablet Take 1 tablet (25 mg total) by mouth daily. 90 tablet 3  . HYDROcodone-acetaminophen (NORCO) 10-325 MG tablet Take 1 tablet by mouth every 8 (eight) hours as needed. 21 tablet 0  . lisinopril (PRINIVIL,ZESTRIL) 40 MG tablet Take 1 tablet (40 mg total) by mouth daily. 90 tablet 3  . meclizine (ANTIVERT) 25 MG tablet Take 1 tablet (25 mg  total) by mouth 3 (three) times daily as needed for dizziness. 30 tablet 1  . mometasone-formoterol (DULERA) 200-5 MCG/ACT AERO Inhale 2 puffs into the lungs 2 (two) times daily. 1 Inhaler 6  . montelukast (SINGULAIR) 10 MG tablet Take 1 tablet (10 mg total) by mouth at bedtime. 90 tablet 6  . ondansetron (ZOFRAN) 4 MG tablet Take 1 tablet (4 mg total) by mouth every 8 (eight) hours as needed for nausea. 30 tablet 3  . OTEZLA 30 MG TABS Take 30 mg by mouth.    . predniSONE (DELTASONE) 20 MG tablet Take 6 tablets daily for 1 day, 5 tabs for 1 day, 4 tabs for 1 day, 3 tabs for 1 day, 2 tabs  for 1 day, then 1 tablet 21 tablet 0  . promethazine (PHENERGAN) 25 MG tablet Take 1 tablet (25 mg total) by mouth every 8 (eight) hours as needed for nausea or vomiting. 30 tablet 1  . tiotropium (SPIRIVA) 18 MCG inhalation capsule 2 inhalations daily. 60 capsule 12  . triamcinolone ointment (KENALOG) 0.5 % Apply 1 application topically 2 (two) times daily. 30 g 0  . zolpidem (AMBIEN) 10 MG tablet Take 1 tablet (10 mg total) by mouth at bedtime as needed for sleep. 30 tablet 5   No current facility-administered medications for this visit.    Objective: Office vital signs reviewed. BP 167/85 mmHg  Pulse 102  Temp(Src) 98 F (36.7 C) (Oral)  Wt 236 lb 6.4 oz (107.23 kg)  SpO2 100%   Physical Examination:  General: Awake, alert, well- nourished, NAD Back:  Normal to inspection. Patient has decreased flexion in the lumbar spine. She has full range of motion with extension, but no significant pain with both maneuvers. Tenderness to palpation in the lumbar region over the spinous process and the left paraspinal muscles. She has 5 out of 5 strength in the lower extremities bilaterally. Sensation is symmetric in the lower extremities bilaterally. Negative straight leg raise.  Lumbar x-ray: Mild scoliosis lumbar spine concave left. Diffuse mild degenerative change. No acute bony abnormality. Stable tiny T12 sclerotic density consistent with bone island, unchanged from CT 06/19/2012.  Assessment/Plan: Chronic low back pain The patient is presenting with worsening chronic back pain for the last 4 days. She now endorses radiation of her pain into the left leg. She denies any recent trauma.  No red flags on exam or history. - Patient administered Depo-Medrol 40 mg and Toradol 30 mg IM today in clinic -Patient provided a 6 day steroid taper -Patient given a one-week supply of hydrocodone and advised to continue to follow-up with her PCP next week. I reiterated that typically we tend to hold off on  narcotics for back pain such as hers.  -The patient may benefit from physical therapy in the future -Return precautions discussed -Patient to follow-up with her PCP in one week     No orders of the defined types were placed in this encounter.    Meds ordered this encounter  Medications  . methylPREDNISolone acetate (DEPO-MEDROL) injection 40 mg    Sig:   . ketorolac (TORADOL) 30 MG/ML injection 30 mg    Sig:   . predniSONE (DELTASONE) 20 MG tablet    Sig: Take 6 tablets daily for 1 day, 5 tabs for 1 day, 4 tabs for 1 day, 3 tabs for 1 day, 2 tabs for 1 day, then 1 tablet    Dispense:  21 tablet    Refill:  0  . HYDROcodone-acetaminophen (  NORCO) 10-325 MG tablet    Sig: Take 1 tablet by mouth every 8 (eight) hours as needed.    Dispense:  21 tablet    Refill:  [place] PGY-2, [place]

## 2015-07-01 NOTE — Patient Instructions (Signed)
Starting tomorrow, start a steroid taper: He will take 6 tablets for 1 day, 5 tablets the next day, 4 tablets the next day, 3 tablets the next day, 2 tablets the next day, and 1 tablet on the last day or total of 6 days of treatment.  Keep your follow-up with Dr. Gerlean Ren.  Back Pain, Adult Back pain is very common in adults.The cause of back pain is rarely dangerous and the pain often gets better over time.The cause of your back pain may not be known. Some common causes of back pain include:  Strain of the muscles or ligaments supporting the spine.  Wear and tear (degeneration) of the spinal disks.  Arthritis.  Direct injury to the back. For many people, back pain may return. Since back pain is rarely dangerous, most people can learn to manage this condition on their own. HOME CARE INSTRUCTIONS Watch your back pain for any changes. The following actions may help to lessen any discomfort you are feeling:  Remain active. It is stressful on your back to sit or stand in one place for long periods of time. Do not sit, drive, or stand in one place for more than 30 minutes at a time. Take short walks on even surfaces as soon as you are able.Try to increase the length of time you walk each day.  Exercise regularly as directed by your health care provider. Exercise helps your back heal faster. It also helps avoid future injury by keeping your muscles strong and flexible.  Do not stay in bed.Resting more than 1-2 days can delay your recovery.  Pay attention to your body when you bend and lift. The most comfortable positions are those that put less stress on your recovering back. Always use proper lifting techniques, including:  Bending your knees.  Keeping the load close to your body.  Avoiding twisting.  Find a comfortable position to sleep. Use a firm mattress and lie on your side with your knees slightly bent. If you lie on your back, put a pillow under your knees.  Avoid feeling  anxious or stressed.Stress increases muscle tension and can worsen back pain.It is important to recognize when you are anxious or stressed and learn ways to manage it, such as with exercise.  Take medicines only as directed by your health care provider. Over-the-counter medicines to reduce pain and inflammation are often the most helpful.Your health care provider may prescribe muscle relaxant drugs.These medicines help dull your pain so you can more quickly return to your normal activities and healthy exercise.  Apply ice to the injured area:  Put ice in a plastic bag.  Place a towel between your skin and the bag.  Leave the ice on for 20 minutes, 2-3 times a day for the first 2-3 days. After that, ice and heat may be alternated to reduce pain and spasms.  Maintain a healthy weight. Excess weight puts extra stress on your back and makes it difficult to maintain good posture. SEEK MEDICAL CARE IF:  You have pain that is not relieved with rest or medicine.  You have increasing pain going down into the legs or buttocks.  You have pain that does not improve in one week.  You have night pain.  You lose weight.  You have a fever or chills. SEEK IMMEDIATE MEDICAL CARE IF:   You develop new bowel or bladder control problems.  You have unusual weakness or numbness in your arms or legs.  You develop nausea or vomiting.  You develop abdominal pain.  You feel faint.   This information is not intended to replace advice given to you by your health care provider. Make sure you discuss any questions you have with your health care provider.   Document Released: 12/25/2004 Document Revised: 01/15/2014 Document Reviewed: 04/28/2013 Elsevier Interactive Patient Education [employer].

## 2015-07-07 ENCOUNTER — Ambulatory Visit (INDEPENDENT_AMBULATORY_CARE_PROVIDER_SITE_OTHER): Payer: Medicare Other | Admitting: Family Medicine

## 2015-07-07 ENCOUNTER — Encounter: Payer: Self-pay | Admitting: Family Medicine

## 2015-07-07 ENCOUNTER — Ambulatory Visit: Payer: Medicare Other | Admitting: Family Medicine

## 2015-07-07 VITALS — BP 160/92 | HR 86 | Temp 98.3°F | Wt 234.8 lb

## 2015-07-07 DIAGNOSIS — I10 Essential (primary) hypertension: Secondary | ICD-10-CM

## 2015-07-07 DIAGNOSIS — F411 Generalized anxiety disorder: Secondary | ICD-10-CM | POA: Diagnosis not present

## 2015-07-07 DIAGNOSIS — G4700 Insomnia, unspecified: Secondary | ICD-10-CM | POA: Diagnosis not present

## 2015-07-07 DIAGNOSIS — G5702 Lesion of sciatic nerve, left lower limb: Secondary | ICD-10-CM

## 2015-07-07 MED ORDER — HYDROCHLOROTHIAZIDE 25 MG PO TABS: 25.0000 mg | ORAL_TABLET | Freq: Two times a day (BID) | ORAL | Status: DC

## 2015-07-07 MED ORDER — CETIRIZINE HCL 10 MG PO TABS: 10.0000 mg | ORAL_TABLET | Freq: Every day | ORAL | Status: DC

## 2015-07-07 MED ORDER — ALPRAZOLAM 1 MG PO TABS
ORAL_TABLET | ORAL | Status: DC
Start: 2015-07-07 — End: 2015-09-05

## 2015-07-07 NOTE — Patient Instructions (Addendum)
Thank you so much for coming to visit with me today! We will increase your Hydrochlorothiazide to twice a day. Please have them check your blood pressure when you come to your next appointment in two weeks.  I hope your back pain gets better! I have included information on it below and will give you a handout on exercises that should help.  Please let me know if the pain does not improve or continues to worsen! Dr. Gerlean Ren  Piriformis Syndrome With Rehab Piriformis syndrome is a condition the affects the nervous system in the area of the hip, and is characterized by pain and possibly a loss of feeling in the backside (posterior) thigh that may extend down the entire length of the leg. The symptoms are caused by an increase in pressure on the sciatic nerve by the piriformis muscle, which is on the back of the hip and is responsible for externally rotating the hip. The sciatic nerve and its branches connect to much of the leg. Normally the sciatic nerve runs between the piriformis muscle and other muscles. However, in certain individuals the nerve runs through the muscle, which causes an increase in pressure on the nerve and results in the symptoms of piriformis syndrome. SYMPTOMS   Pain, tingling, numbness, or burning in the back of the thigh that may also extend down the entire leg.  Occasionally, tenderness in the buttock.  Loss of function of the leg.  Pain that worsens when using the piriformis muscle (running, jumping, or stairs).  Pain that increases with prolonged sitting.  Pain that is lessened by lying flat on the back. CAUSES   Piriformis syndrome is the result of an increase in pressure placed on the sciatic nerve. Oftentimes, piriformis syndrome is an overuse injury.  Stress placed on the nerve from a sudden increase in the intensity, frequency, or duration of training.  Compensation of other extremity injuries. RISK INCREASES WITH:  Sports that involve the piriformis muscle  (running, walking, or jumping).  You are born with (congenital) a defect in which the sciatic nerve passes through the muscle. PREVENTION  Warm up and stretch properly before activity.  Allow for adequate recovery between workouts.  Maintain physical fitness:  Strength, flexibility, and endurance.  Cardiovascular fitness. PROGNOSIS  If treated properly, the symptoms of piriformis syndrome usually resolve in 2 to 6 weeks. RELATED COMPLICATIONS   Persistent and possibly permanent pain and numbness in the lower extremity.  Weakness of the extremity that may progress to disability and inability to compete. TREATMENT  The most effective treatment for piriformis syndrome is rest from any activities that aggravate the symptoms. Ice and pain medication may help reduce pain and inflammation. The use of strengthening and stretching exercises may help reduce pain with activity. These exercises may be performed at home or with a therapist. A referral to a therapist may be given for further evaluation and treatment, such as ultrasound. Corticosteroid injections may be given to reduce inflammation that is causing pressure to be placed on the sciatic nerve. If nonsurgical (conservative) treatment is unsuccessful, then surgery may be recommended.  MEDICATION   If pain medication is necessary, then nonsteroidal anti-inflammatory medications, such as aspirin and ibuprofen, or other minor pain relievers, such as acetaminophen, are often recommended.  Do not take pain medication for 7 days before surgery.  Prescription pain relievers may be given if deemed necessary by your caregiver. Use only as directed and only as much as you need.  Corticosteroid injections may be given  by your caregiver. These injections should be reserved for the most serious cases, because they may only be given a certain number of times. HEAT AND COLD:   Cold treatment (icing) relieves pain and reduces inflammation. Cold  treatment should be applied for 10 to 15 minutes every 2 to 3 hours for inflammation and pain and immediately after any activity that aggravates your symptoms. Use ice packs or massage the area with a piece of ice (ice massage).  Heat treatment may be used prior to performing the stretching and strengthening activities prescribed by your caregiver, physical therapist, or athletic trainer. Use a heat pack or soak the injury in warm water. SEEK IMMEDIATE MEDICAL CARE IF:  Treatment seems to offer no benefit, or the condition worsens.  Any medications produce adverse side effects.

## 2015-07-10 DIAGNOSIS — G5702 Lesion of sciatic nerve, left lower limb: Secondary | ICD-10-CM | POA: Insufficient documentation

## 2015-07-10 DIAGNOSIS — L409 Psoriasis, unspecified: Secondary | ICD-10-CM | POA: Insufficient documentation

## 2015-07-10 NOTE — Assessment & Plan Note (Addendum)
-   HCTZ 25mg  increased to twice daily dosing. Discussed if worsening in cramping occurs, can consider transition to another medication/decreasing dose back down. - Continue Amlodipine 10mg  daily, Coreg 25mg  BID - Self discontinued Lisinopril. Consider restarting if not controlled with increased HCTZ. - Will obtain renal US - To recheck BP at next visit with Dr. Gwenlyn Saran in 2 weeks.

## 2015-07-10 NOTE — Assessment & Plan Note (Signed)
Ambien refilled

## 2015-07-10 NOTE — Assessment & Plan Note (Signed)
Xanax refilled.  

## 2015-07-10 NOTE — Assessment & Plan Note (Signed)
-   Improved. Suspect Piriformis Syndrome. - No medications at this visit. - Exercises given. - Return if symptoms worsen or fail to resolve.

## 2015-07-10 NOTE — Progress Notes (Signed)
Subjective:     Patient ID: Stacy Campbell, female   DOB: [birth year], 60 y.o.   MRN: [id number]  HPI Stacy Campbell is a 60yo female presenting for back pain follow up. - Recently seen on 6/23 for 4 day history of back pain with sciatica. Was given Depomedrol and Toradol shot and Prednisone taper. Was also given Hydrocodone. - Back pain has improved since last visit. No longer has midline pain. Continues to have pain in left buttock and down left leg - Has not been taking Gabapentin. - Reports Hydrocodone has given her itching--last taken 6/27 with improvement in itching after discontinuing. Denies new lotion, shampoo, soap, detergent. - Does not think she needs any further pain medication given improvement. - Denies numbness, tingling, incontinence  # Hypertension: - Medications reviewed and updated. States she discontinued Lisinopril when she started Amlodipine since she was nervous about being on four different blood pressure medications. - No history of renal US. - Denies chest pain, shortness of breath, headache, blurred vision  # Anxiety - Improved. States she is getting significant relief from meeting with Dr. Gwenlyn Saran - [name] still away from home, although she is requesting to return. Stacy Campbell is standing by her decision to make [place] complete the entire program she is currently in. She has had improved anxiety and sleep since [place] has been out of home. - Requests refill of Ambien and Xanax.  Former Smoker   Review of Systems Per HPI. Other systems negative.    Objective:   Physical Exam  Constitutional: She appears well-developed and well-nourished. No distress.  HENT:  Head: Normocephalic and atraumatic.  Cardiovascular: Normal rate and regular rhythm.  Exam reveals no gallop and no friction rub.   No murmur heard. Pulmonary/Chest: Effort normal. No respiratory distress. She has no wheezes.  Musculoskeletal:  No midline spinal tenderness. Straight leg raise  negative. Tenderness over left piriformis with reproduction of radiation down leg.  Psychiatric: She has a normal mood and affect. Her behavior is normal.      Assessment and Plan:      Piriformis syndrome of left side - Back Pain resolved, left Piriformis tenderness with radiation remains - No medications for back pain at this visit. - Exercises given. - Return if symptoms worsen or fail to resolve.  HYPERTENSION, BENIGN SYSTEMIC - HCTZ 25mg  increased to twice daily dosing. Discussed if worsening in cramping occurs, can consider transition to another medication/decreasing dose back down. - Continue Amlodipine 10mg  daily, Coreg 25mg  BID - Self discontinued Lisinopril. Consider restarting if not controlled with increased HCTZ. - Will obtain renal US - To recheck BP at next visit with Dr. Gwenlyn Saran in 2 weeks.  Anxiety state - Xanax refilled  Insomnia - Ambien refilled

## 2015-07-10 NOTE — Assessment & Plan Note (Addendum)
-   Back Pain resolved, left Piriformis tenderness with radiation remains - No medications for back pain at this visit. - Exercises given. - Cetirizine for itching  - Return if symptoms worsen or fail to resolve.

## 2015-07-11 ENCOUNTER — Ambulatory Visit (HOSPITAL_COMMUNITY): Payer: Medicare Other

## 2015-07-13 ENCOUNTER — Other Ambulatory Visit: Payer: Self-pay | Admitting: Family Medicine

## 2015-07-13 ENCOUNTER — Ambulatory Visit (HOSPITAL_COMMUNITY)
Admission: RE | Admit: 2015-07-13 | Discharge: 2015-07-13 | Disposition: A | Discharge status: Home / Self Care | Payer: Medicare Other | Source: Ambulatory Visit | Attending: Family Medicine | Admitting: Family Medicine

## 2015-07-13 DIAGNOSIS — F329 Major depressive disorder, single episode, unspecified: Secondary | ICD-10-CM | POA: Diagnosis not present

## 2015-07-13 DIAGNOSIS — J45909 Unspecified asthma, uncomplicated: Secondary | ICD-10-CM | POA: Insufficient documentation

## 2015-07-13 DIAGNOSIS — G4700 Insomnia, unspecified: Secondary | ICD-10-CM | POA: Insufficient documentation

## 2015-07-13 DIAGNOSIS — F419 Anxiety disorder, unspecified: Secondary | ICD-10-CM | POA: Insufficient documentation

## 2015-07-13 DIAGNOSIS — I10 Essential (primary) hypertension: Secondary | ICD-10-CM

## 2015-07-13 DIAGNOSIS — I159 Secondary hypertension, unspecified: Secondary | ICD-10-CM

## 2015-07-13 NOTE — Progress Notes (Signed)
Preliminary results by tech - Renal Duplex Completed. Mildly elevated velocities in bilateral renal arteries suggestive of 1-59% stenosis.  Oda Cogan, BS, RDMS, RVT

## 2015-07-13 NOTE — Progress Notes (Deleted)
Preliminary results by tech - Renal Duplex Completed. Bilateral renal arteries demonstrated mildly elevated velocities suggestive of 1-59% stenosis.  Oda Cogan, BS, RDMS, RVT

## 2015-07-25 ENCOUNTER — Ambulatory Visit (INDEPENDENT_AMBULATORY_CARE_PROVIDER_SITE_OTHER): Payer: Medicare Other | Admitting: *Deleted

## 2015-07-25 ENCOUNTER — Ambulatory Visit (INDEPENDENT_AMBULATORY_CARE_PROVIDER_SITE_OTHER): Payer: Medicare Other | Admitting: Psychology

## 2015-07-25 VITALS — BP 148/88 | HR 87

## 2015-07-25 DIAGNOSIS — F329 Major depressive disorder, single episode, unspecified: Secondary | ICD-10-CM

## 2015-07-25 DIAGNOSIS — I10 Essential (primary) hypertension: Secondary | ICD-10-CM

## 2015-07-25 DIAGNOSIS — F32A Depression, unspecified: Secondary | ICD-10-CM

## 2015-07-25 DIAGNOSIS — Z0130 Encounter for examination of blood pressure without abnormal findings: Secondary | ICD-10-CM

## 2015-07-25 DIAGNOSIS — Z136 Encounter for screening for cardiovascular disorders: Secondary | ICD-10-CM

## 2015-07-25 NOTE — Patient Instructions (Signed)
It was great to see you today.  Please schedule a follow-up for:  August 14th at 10:00.  Couple things to keep in mind: 1.  Might want to contact the school to see if Stacy Campbell can be evaluated (especially for his reading) 2.  A food log has been shown to help people lose weight.  It can be a challenge to keep but may be worth it. 3. Regularly scheduled meals is important for losing weight.  One meal a day likely slows your metabolism down and makes it harder to lose weight.

## 2015-07-25 NOTE — Progress Notes (Signed)
   Patient in nurse clinic for blood pressure check.  Pt denies any symptoms today.  Patient reported taking medications as prescribed.  Will forward to PCP. Derl Barrow, RN

## 2015-07-25 NOTE — Progress Notes (Signed)
Reason for follow-up:  Support for maintaining emotional stability.  Issues discussed:  Weight / diet; medical issues (itching, pain), [name], [name]  Identified goals:  She would like to get off the sweets and sodas (again).  Kept them out of the house for awhile and made progress with weight loss.

## 2015-07-25 NOTE — Assessment & Plan Note (Signed)
No PHQ-9 today.  She notes 2 days in the past two weeks of depression or irritable mood.  Mild anhedonia.  Seems to enjoy her computer games.  Function seems reasonable:  Cleans house, cooks regularly.  Not interested in moving her body more secondary to pain.  Discussed strategies for weight loss including a food diary.    See patient instructions for further plan.

## 2015-08-18 NOTE — Progress Notes (Signed)
Subjective:     Patient ID: Stacy Campbell, female   DOB: Oct 09, 1955, 60 y.o.   MRN: [id number]  HPI Stacy Campbell is a 60 year old female presenting today to follow-up on back pain. -Long history of back pain with acute exacerbations. First documented in 2008. Reports this pain is similar to prior - Buttock pain and radiation noted at last office visit has resolved - This flare started shortly after last office visit and has worsened - Was sent a back brace by her insurance which has helped but is too small. Only wearing it when she really needs it. Requests larger size. - States it is not the pain medication that is causing the itching, but is instead [place]. Doxipin has  been helping. Next scheduled to see Dermatology 8/29. - Denies urinary and fecal incontinence, saddle anesthesia - Has tried tramadol, TENS unit, Lyrica, Flexeril, ibuprofen, and Tylenol without relief. -Reports problems with ADLs due to back pain including chores, travel, and playing with grandkids. -Lumbar films from April 2017 showed diffuse degeneration with mild concave left scoliosis with a question of scoliosis versus positioning. -Last seen on June 29 for same. History of sciatica down left leg. Suspected to be partly secondary to piriformis syndrome No medications given at this visit. Was also previouslyseen on June 23 for same. Injection of Depo-Medrol and Toradol given as well as a 6 day prednisone taper. -Also being treated for hypertension. Ultrasound obtained in 7 2017 which showed bilateral renal artery stenosis 1-59%. Currently taking HCTZ 25 mg twice a day (increased at last office visit from once daily dosing), amlodipine 10 mg daily, and Coreg 25 mg twice a day. Self discontinued lisinopril.  - Former Smoker  Review of Systems Per HPI. Other systems negative.    Objective:   Physical Exam  Constitutional: She appears well-developed and well-nourished. No distress.  HENT:  Head: Normocephalic and  atraumatic.  Cardiovascular: Normal rate and regular rhythm.   No murmur heard. Pulmonary/Chest: Effort normal. No respiratory distress. She has no wheezes.  Musculoskeletal:  Midline lumbar tenderness with bilateral paraspinal tenderness. No piriformis tenderness--improved from last office visit.  Psychiatric: She has a normal mood and affect. Her behavior is normal.       Assessment and Plan:     Chronic low back pain - Depo+Toradol injection today - Norco x1week given significant loss of ADLs - Prescription for back brace given - Given multiple recurrences, will refer to Orthopedics - Follow up if symptoms worsen or fail to resolve  HYPERTENSION, BENIGN SYSTEMIC - Improved. - Continue current medications

## 2015-08-19 ENCOUNTER — Ambulatory Visit (INDEPENDENT_AMBULATORY_CARE_PROVIDER_SITE_OTHER): Payer: Medicare Other | Admitting: Family Medicine

## 2015-08-19 VITALS — BP 126/79 | HR 84 | Temp 98.1°F | Wt 237.2 lb

## 2015-08-19 DIAGNOSIS — I10 Essential (primary) hypertension: Secondary | ICD-10-CM | POA: Diagnosis not present

## 2015-08-19 DIAGNOSIS — G8929 Other chronic pain: Secondary | ICD-10-CM

## 2015-08-19 DIAGNOSIS — M549 Dorsalgia, unspecified: Secondary | ICD-10-CM | POA: Diagnosis present

## 2015-08-19 DIAGNOSIS — M545 Low back pain: Secondary | ICD-10-CM

## 2015-08-19 MED ORDER — PROMETHAZINE HCL 25 MG PO TABS: 25.0000 mg | ORAL_TABLET | Freq: Three times a day (TID) | ORAL | 1 refills | Status: DC | PRN

## 2015-08-19 MED ORDER — KETOROLAC TROMETHAMINE 30 MG/ML IJ SOLN
30.0000 mg | Freq: Once | INTRAMUSCULAR | Status: AC
  Administered 2015-08-19: 30 mg via INTRAMUSCULAR

## 2015-08-19 MED ORDER — HYDROCODONE-ACETAMINOPHEN 5-325 MG PO TABS: 1.0000 | ORAL_TABLET | Freq: Four times a day (QID) | ORAL | 0 refills | Status: DC | PRN

## 2015-08-19 MED ORDER — KETOROLAC TROMETHAMINE 30 MG/ML IM SOLN: 30.0000 mg | Freq: Once | INTRAMUSCULAR | 0 refills | Status: DC

## 2015-08-19 MED ORDER — METHYLPREDNISOLONE ACETATE 40 MG/ML IJ SUSP
40.0000 mg | Freq: Once | INTRAMUSCULAR | Status: AC
  Administered 2015-08-19: 40 mg via INTRAMUSCULAR

## 2015-08-19 NOTE — Patient Instructions (Addendum)
Thank you so much for coming to visit today! -We will give you a shot of Toradol and steroids today. I will refill your pain medication for 1 week. I have given you a prescription for a back brace. Please only wear as needed  As overuse can weaken the abdominal muscles and worsen your back pain. I will refer you to orthopedics given your recurring back pain. -Please follow-up with dermatology -Congratulations on your blood pressure. You are doing a great job!  Dr.Rumley

## 2015-08-21 NOTE — Assessment & Plan Note (Addendum)
-   Depo+Toradol injection today - Norco x1week given significant loss of ADLs - Prescription for back brace given - Given multiple recurrences, will refer to Orthopedics - Follow up if symptoms worsen or fail to resolve

## 2015-08-21 NOTE — Assessment & Plan Note (Signed)
-   Improved. - Continue current medications

## 2015-08-22 ENCOUNTER — Ambulatory Visit (INDEPENDENT_AMBULATORY_CARE_PROVIDER_SITE_OTHER): Payer: Medicare Other | Admitting: Psychology

## 2015-08-22 DIAGNOSIS — F329 Major depressive disorder, single episode, unspecified: Secondary | ICD-10-CM

## 2015-08-22 DIAGNOSIS — F32A Depression, unspecified: Secondary | ICD-10-CM

## 2015-08-22 NOTE — Patient Instructions (Signed)
Please schedule a follow-up for:  September 8th at 8:30.    I am sorry you are itching so badly.  I will do some research and see what I can find.   Good job with the shifts in eating / drinking.  I think keeping the bad stuff out of the house is a great strategy.  I hope it continues to pay off for you.

## 2015-08-22 NOTE — Assessment & Plan Note (Signed)
Itching is currently the thing that is pushing her mood most lately.  With [name] out of the house, the stress level has been lower.  PHQ-9 of 6 indicates a relatively low level of depression.  I will research treatments for psychogenic itching.  She has a history of skin conditions but we agreed that given her tendency to itch significantly more at home, there might be a psychological factor.

## 2015-08-22 NOTE — Progress Notes (Signed)
Reason for follow-up:  Continued work on mood management in the context of chronic life stressors.  Issues discussed:  Itching.  This was the main thing she focused on.  It has been going on for years and has never taken center stage.  Patient thinks that is because other things are going relatively well AND the itching is getting worse.  Sees derm.  Does bleach baths.  Has all kinds of ointments.  Has run the list of possible contact issues (detergent etc...).  Mostly itches at home.  Does not believe bugs are the culprit as no other family members itch.  Daughter Joycelyn Schmid) says it is "in her head."    Identified goals:  I will research treatments for psychogenic itching.  She has a history of skin conditions but we agreed that given her tendency to itch significantly more at home, there might be a psychological factor.

## 2015-09-05 ENCOUNTER — Other Ambulatory Visit: Payer: Self-pay | Admitting: Family Medicine

## 2015-09-05 DIAGNOSIS — G4700 Insomnia, unspecified: Secondary | ICD-10-CM

## 2015-09-05 NOTE — Telephone Encounter (Signed)
Patient calling again for xanax refill

## 2015-09-06 DIAGNOSIS — R21 Rash and other nonspecific skin eruption: Secondary | ICD-10-CM | POA: Diagnosis not present

## 2015-09-06 DIAGNOSIS — L308 Other specified dermatitis: Secondary | ICD-10-CM | POA: Diagnosis not present

## 2015-09-06 DIAGNOSIS — L280 Lichen simplex chronicus: Secondary | ICD-10-CM | POA: Diagnosis not present

## 2015-09-06 DIAGNOSIS — D485 Neoplasm of uncertain behavior of skin: Secondary | ICD-10-CM | POA: Diagnosis not present

## 2015-09-16 ENCOUNTER — Ambulatory Visit (INDEPENDENT_AMBULATORY_CARE_PROVIDER_SITE_OTHER): Payer: Medicare Other | Admitting: Psychology

## 2015-09-16 DIAGNOSIS — F329 Major depressive disorder, single episode, unspecified: Secondary | ICD-10-CM

## 2015-09-16 DIAGNOSIS — F32A Depression, unspecified: Secondary | ICD-10-CM

## 2015-09-16 NOTE — Progress Notes (Signed)
Reason for follow-up:  Continued management of mood.  Issues discussed:  Stacy Campbell is still not home.  Not sure when she will be.  Not doing well at the treatment center.  Things remain significantly lower in stress at home without her there.  October 3rd is the anniversary of Stacy Campbell's death.  Overall, grief has been better.  PHQ-9 is 6 today.  Patient reports some dips into sadness but otherwise content.  Itching - derm took some biopsies and is doing some type of patch diagnostic test (??).  She follows with them regularly.    Identified goals:  She has no identified goals for herself at present other than to try to figure out why she itches (and stop it).

## 2015-09-16 NOTE — Assessment & Plan Note (Signed)
PHQ-9 of 6 with somewhat difficult.  I can't identify functional limitations other than that related to her back pain.  She has no goals identified for herself and does not see the need for them presently.  She does want to come back.  Spaced out to 6 weeks (from a month).    I will do a bit more research on psychogenic factors for itching or behavioral strategies to manage just as a way to help what already is being done through derm.

## 2015-09-16 NOTE — Patient Instructions (Signed)
It was good to see you today.  I will research some more about the itching but from what I read, I don't think there is going to be much to do from our side of things.  Please schedule a follow-up for:  October 20th at 8:30.

## 2015-09-20 DIAGNOSIS — M549 Dorsalgia, unspecified: Secondary | ICD-10-CM | POA: Diagnosis not present

## 2015-09-20 DIAGNOSIS — Z6839 Body mass index (BMI) 39.0-39.9, adult: Secondary | ICD-10-CM | POA: Diagnosis not present

## 2015-09-20 DIAGNOSIS — M4716 Other spondylosis with myelopathy, lumbar region: Secondary | ICD-10-CM | POA: Diagnosis not present

## 2015-09-20 DIAGNOSIS — L259 Unspecified contact dermatitis, unspecified cause: Secondary | ICD-10-CM | POA: Diagnosis not present

## 2015-09-20 DIAGNOSIS — Q762 Congenital spondylolisthesis: Secondary | ICD-10-CM | POA: Diagnosis not present

## 2015-09-20 DIAGNOSIS — L309 Dermatitis, unspecified: Secondary | ICD-10-CM | POA: Diagnosis not present

## 2015-09-20 DIAGNOSIS — I10 Essential (primary) hypertension: Secondary | ICD-10-CM | POA: Diagnosis not present

## 2015-09-22 DIAGNOSIS — L309 Dermatitis, unspecified: Secondary | ICD-10-CM | POA: Diagnosis not present

## 2015-09-26 ENCOUNTER — Other Ambulatory Visit: Payer: Self-pay | Admitting: *Deleted

## 2015-09-26 NOTE — Telephone Encounter (Signed)
Patient would like a refill on her hydrocodone due to her back pain. Kendel Bessey,CMA

## 2015-09-27 DIAGNOSIS — L309 Dermatitis, unspecified: Secondary | ICD-10-CM | POA: Diagnosis not present

## 2015-09-27 DIAGNOSIS — L400 Psoriasis vulgaris: Secondary | ICD-10-CM | POA: Diagnosis not present

## 2015-09-27 DIAGNOSIS — L259 Unspecified contact dermatitis, unspecified cause: Secondary | ICD-10-CM | POA: Diagnosis not present

## 2015-09-29 ENCOUNTER — Encounter: Payer: Self-pay | Admitting: Family Medicine

## 2015-09-29 ENCOUNTER — Ambulatory Visit (INDEPENDENT_AMBULATORY_CARE_PROVIDER_SITE_OTHER): Payer: Medicare Other | Admitting: Family Medicine

## 2015-09-29 VITALS — BP 178/90 | HR 100 | Temp 98.4°F | Ht 65.0 in | Wt 238.6 lb

## 2015-09-29 DIAGNOSIS — I10 Essential (primary) hypertension: Secondary | ICD-10-CM

## 2015-09-29 DIAGNOSIS — M545 Low back pain: Secondary | ICD-10-CM | POA: Diagnosis present

## 2015-09-29 DIAGNOSIS — G4700 Insomnia, unspecified: Secondary | ICD-10-CM

## 2015-09-29 DIAGNOSIS — G8929 Other chronic pain: Secondary | ICD-10-CM | POA: Diagnosis not present

## 2015-09-29 DIAGNOSIS — Z23 Encounter for immunization: Secondary | ICD-10-CM

## 2015-09-29 MED ORDER — ZOLPIDEM TARTRATE 10 MG PO TABS: 10.0000 mg | ORAL_TABLET | Freq: Every evening | ORAL | 5 refills | Status: DC | PRN

## 2015-09-29 MED ORDER — METHYLPREDNISOLONE ACETATE 40 MG/ML IJ SUSP
40.0000 mg | Freq: Once | INTRAMUSCULAR | Status: AC
Start: 2015-09-29 — End: 2015-09-29
  Administered 2015-09-29: 40 mg via INTRAMUSCULAR

## 2015-09-29 MED ORDER — KETOROLAC TROMETHAMINE 30 MG/ML IJ SOLN
30.0000 mg | Freq: Once | INTRAMUSCULAR | Status: AC
  Administered 2015-09-29: 30 mg via INTRAMUSCULAR

## 2015-09-29 MED ORDER — HYDROCODONE-ACETAMINOPHEN 5-325 MG PO TABS: 1.0000 | ORAL_TABLET | Freq: Four times a day (QID) | ORAL | 0 refills | Status: DC | PRN

## 2015-09-29 NOTE — Patient Instructions (Addendum)
Thank you so much for coming to visit today! I am sorry her back pain is once again acting up. I have refilled your pain medicine. We will give you a shot of steroids and pain medicines today. Please follow-up with your spine doctor. I do recommend an MRI of your spine. Your blood pressure was elevated today. Please follow-up in 2 weeks for nurse visit to recheck your blood pressure.  Dr. Gerlean Ren

## 2015-10-02 NOTE — Progress Notes (Signed)
Subjective:     Patient ID: Stacy Campbell, female   DOB: 1955/09/07, 60 y.o.   MRN: [id number]  HPI Stacy Campbell is a 60yo female presenting today for low back pain. - Long history of back pain with acute exacerbations. First documented in 2008. - Has had intermittent history of radiation. Was resolved at last office visit. Has now returned with radiation down left leg. - Has had current flare x1 week - Denies urinary and fecal incontinence, saddle anesthesia - Lumbar films in April 2017 showed diffuse degeneration with scoliosis.  - Has been [facility]. They referred her to physical therapy--she is in process of setting up dates to go. Next visit on 10/26 - Has tried Tramadol, TENS unit, Lyrica, Flexeril, Ibuprofen, and Tylenol in past without relief. - Reports pain is so bad it keeps her from standing for long. Feels like "whole back is going to break in half" - Reports problems with ADLs due to back pain, including chores at home, travel out of home, and playing with grandkids - Has historically had improvement with injections of Depo-Medrol and Toradol - History of Hypertension noted. Currently taking HCTZ 25mg  twice daily, Amlodipine 10mg , and Coreg 25mg  twice daily. Renal US obtained in 07/2015. - Denies shortness of breath, headache, chest pain, weakness, numbness - Requests refill of Ambien. Only uses occasionally, but helps with sleep when used. - Former Smoker  Review of Systems Per HPI.     Objective:   Physical Exam  Constitutional: She appears well-developed and well-nourished. No distress.  HENT:  Head: Normocephalic and atraumatic.  Cardiovascular: Normal rate and regular rhythm.   No murmur heard. Pulmonary/Chest: Effort normal. No respiratory distress. She has no wheezes.  Musculoskeletal:  Midline lumbar tenderness. Tenderness of left piriformis with reproduction of radiation down left leg. Muscle strength 5/5 in upper and lower  extremities. Negative straight leg raise.  Psychiatric: She has a normal mood and affect. Her behavior is normal.      Assessment and Plan:     Chronic low back pain - [place] Narcotic Database reviewed - Norco refilled. No further refills for acute pain to be given without office visit. - Depo + Toradol injection today - Encouraged to keep appointment for physical therapy  - Follow up with [facility] as scheduled  Insomnia - Ambien refilled  HYPERTENSION, BENIGN SYSTEMIC - Elevated today, Suspect secondary to pain. - Follow up in 1-2 weeks for nurse visit to recheck. - Return precautions and red flags reviewed

## 2015-10-03 DIAGNOSIS — L209 Atopic dermatitis, unspecified: Secondary | ICD-10-CM | POA: Diagnosis not present

## 2015-10-03 NOTE — Assessment & Plan Note (Signed)
Ambien refilled

## 2015-10-03 NOTE — Assessment & Plan Note (Signed)
-   [place] Narcotic Database reviewed - Norco refilled. No further refills for acute pain to be given without office visit. - Depo + Toradol injection today - Encouraged to keep appointment for physical therapy  - Follow up with [facility] as scheduled

## 2015-10-03 NOTE — Assessment & Plan Note (Signed)
-   Elevated today, Suspect secondary to pain. - Follow up in 1-2 weeks for nurse visit to recheck. - Return precautions and red flags reviewed

## 2015-10-05 DIAGNOSIS — L209 Atopic dermatitis, unspecified: Secondary | ICD-10-CM | POA: Diagnosis not present

## 2015-10-07 DIAGNOSIS — L209 Atopic dermatitis, unspecified: Secondary | ICD-10-CM | POA: Diagnosis not present

## 2015-10-10 DIAGNOSIS — L209 Atopic dermatitis, unspecified: Secondary | ICD-10-CM | POA: Diagnosis not present

## 2015-10-12 DIAGNOSIS — L209 Atopic dermatitis, unspecified: Secondary | ICD-10-CM | POA: Diagnosis not present

## 2015-10-14 ENCOUNTER — Ambulatory Visit (INDEPENDENT_AMBULATORY_CARE_PROVIDER_SITE_OTHER): Payer: Medicare Other | Admitting: *Deleted

## 2015-10-14 VITALS — BP 146/80 | HR 86

## 2015-10-14 DIAGNOSIS — I10 Essential (primary) hypertension: Secondary | ICD-10-CM

## 2015-10-14 DIAGNOSIS — Z0130 Encounter for examination of blood pressure without abnormal findings: Secondary | ICD-10-CM

## 2015-10-14 NOTE — Progress Notes (Signed)
   Patient in nurse clinic for blood pressure check.  Patient denies any chest pain, SOB, dizziness, weakness or n/v.  Patient reported headache off/on and back pain, PCP is aware of both.  Patient is taking her blood pressure medication as prescribed. Will forward to PCP.  Derl Barrow, RN   Today's Vitals   10/14/15 0909 10/14/15 0912  BP: (!) 146/80   Pulse: 86   SpO2: 97%   PainSc: 4  4

## 2015-10-17 DIAGNOSIS — L209 Atopic dermatitis, unspecified: Secondary | ICD-10-CM | POA: Diagnosis not present

## 2015-10-19 DIAGNOSIS — L209 Atopic dermatitis, unspecified: Secondary | ICD-10-CM | POA: Diagnosis not present

## 2015-10-23 ENCOUNTER — Other Ambulatory Visit: Payer: Self-pay | Admitting: Family Medicine

## 2015-10-27 ENCOUNTER — Other Ambulatory Visit: Payer: Self-pay | Admitting: Family Medicine

## 2015-10-28 ENCOUNTER — Ambulatory Visit (INDEPENDENT_AMBULATORY_CARE_PROVIDER_SITE_OTHER): Payer: Medicare Other | Admitting: Psychology

## 2015-10-28 DIAGNOSIS — F331 Major depressive disorder, recurrent, moderate: Secondary | ICD-10-CM

## 2015-10-28 NOTE — Progress Notes (Signed)
Reason for follow-up:  Patient presented for continued mood management.  She was last seen six weeks ago.    Issues discussed:  Today she reports that she thinks she is slipping into a "deeper" depression.  She stated that for the last several MONTHS this has been the case but that she had been trying to cover it up both at home and in her last visit with me.  She reports it predated the anniversary of her daughter's death on 2022/11/06.  Also discussed (and has been before) Kindra's report that she is not an outgoing or cheerful person and has NEVER been.  Her children call her "Meanness" because of her general demeanor and per Jeff, this is not mood related but more personality based.    Identified goals:  I said I would do a medication review to see if a Mood Clinic Appointment might be a good idea.

## 2015-10-28 NOTE — Patient Instructions (Signed)
Please schedule a follow-up for: December 1st at 8:30.  There is an app - you can look it up on a regular computer as well, called CALM.  This might be helpful to explore.  The basic version is free.  I am going to take a look back at your medicines.  Think about whether a chance in your medicines would be worth it to you.  We talked about changing your behavior (being more outgoing and kind to others) as a way to potentially change how you feel on the inside.  This would be a lot of effort.  Continue to think about this.  Perhaps experiment for a day and see how you feel.

## 2015-10-28 NOTE — Assessment & Plan Note (Addendum)
Report of mood is more depressed.  PHQ-9 went from 6 to 9 which is now on the upper end of mild depressive symptoms.  Her function has not changed appreciably.  She is more tearful today and says that she has been very emotional lately.  I am thinking this is likely tied to the anniversary.  When I first met Stacy Campbell, she would have this type of emotional reaction on the 3rd of every month.  This seems to have gotten significantly better over the last few years based on how she appears and what she chooses to talk about, however upon discussing it with her today, she says that it has been present all along.  This may be accurate or an artifact of how she is feeling at this moment.    Currently taking Abilify 5 mg daily.  She says she has never been tried on a higher dose.  Takes Alprazolam 1.0 mg one or two times a week.  Ambien 10 mg is also on her medication list.  Not sure if she takes this (she was gone when I did the chart review).  Med review indicates she has been tried on:  Zoloft (highest dose 250 mg (??) per note), Cymbalta (30 mg).  11/2013 she was on 5 mg of Abilify and 250 mg of Zoloft (although I think this was a typo and it was really 150 mg) and appeared to have better mood function.  Stacy Campbell said "we" discontinued that medicine but I can not see a record of that.  Called her and she said perhaps she stopped the Zoloft by mistake - thinking that the Abilify was replacing it.  She is interested in restarting.  I checked with a medical preceptor and given the fact that she was supposed to be out of Abilify in 03/2015 (and still has some left) and the confusion regarding the Zoloft, thought it best to schedule with her PCP.  Appointment is Monday at 11:15.  Stacy Campbell agrees the focus will be on mood and medicine.  I will touch base with her PCP.  We will follow Stacy Campbell in [facility] on 11/8 at 9:00.

## 2015-10-30 NOTE — Progress Notes (Signed)
Subjective:     Patient ID: Stacy Campbell, female   DOB: [birth year], 60 y.o.   MRN: [id number]  HPI Mrs. Hansard is a 60yo female presenting today for follow up of depression. - Seen in [facility] on 10/2015. PHQ 9 went from 6 to 9, indicated upper end of mild depressive symptoms. No change in function noted, but does note increased tearfulness. Currently taking Abilify 5mg  daily and Alprazolam 1mg  1-2 times per week. Previously noted to be on Zoloft with better control reported, but this was discontinued at some point. Next scheduled to be in [facility] 11/8. -Reports she does not rhythm or stopping Zoloft. She is unsure how long she's been off this medication. Does remember Zoloft made her sleepy during the day been taken in the morning. -Notes increasing symptoms of irritability, since of doom, anxiety, fatigue -Feels her depression is worsening and hoping to head it off before it becomes of severe -Continue to take Abilify 5 mg, Zantac once to twice weekly -Denies suicidal or homicidal ideation  Review of Systems Per history of present illness    Objective:   Physical Exam  Constitutional: She appears well-developed and well-nourished. No distress.  Cardiovascular: Normal rate and regular rhythm.   No murmur heard. Pulmonary/Chest: Effort normal. No respiratory distress. She has no wheezes.  Psychiatric: She has a normal mood and affect. Her behavior is normal.       Assessment and Plan:     Depression -PHQ 9 score 7 without suicidal or homicidal ideation -Continue Abilify 5 mg, Xanax 1-2 times per week -Zoloft restarted. To take 50 mg 1 week and then increase to 100 mg. Anticipate need to further titrate up. -Follow-up with Dr. Gwenlyn Saran on 11/16/2015.  -Follow-up in 4 weeks

## 2015-10-31 ENCOUNTER — Ambulatory Visit (INDEPENDENT_AMBULATORY_CARE_PROVIDER_SITE_OTHER): Payer: Medicare Other | Admitting: Family Medicine

## 2015-10-31 DIAGNOSIS — F331 Major depressive disorder, recurrent, moderate: Secondary | ICD-10-CM | POA: Diagnosis present

## 2015-10-31 MED ORDER — BENZONATATE 100 MG PO CAPS: 100.0000 mg | ORAL_CAPSULE | Freq: Three times a day (TID) | ORAL | 3 refills | Status: DC | PRN

## 2015-10-31 MED ORDER — ALBUTEROL SULFATE HFA 108 (90 BASE) MCG/ACT IN AERS: 2.0000 | INHALATION_SPRAY | Freq: Four times a day (QID) | RESPIRATORY_TRACT | 2 refills | Status: DC | PRN

## 2015-10-31 MED ORDER — ALPRAZOLAM 1 MG PO TABS: ORAL_TABLET | ORAL | 1 refills | Status: DC

## 2015-10-31 MED ORDER — SERTRALINE HCL 50 MG PO TABS: ORAL_TABLET | ORAL | 0 refills | Status: DC

## 2015-10-31 NOTE — Patient Instructions (Signed)
We will restart Zoloft today. Please take 50mg  (1 tablet) for one week. Then start taking 100mg  (2 tablets). We will consider increasing it further at your next visit with Dr. Gwenlyn Saran on 11/16/15.   Please return in 4 weeks or sooner if needed.  Dr. Gerlean Ren

## 2015-11-01 DIAGNOSIS — L2081 Atopic neurodermatitis: Secondary | ICD-10-CM | POA: Diagnosis not present

## 2015-11-01 DIAGNOSIS — L280 Lichen simplex chronicus: Secondary | ICD-10-CM | POA: Diagnosis not present

## 2015-11-01 DIAGNOSIS — L308 Other specified dermatitis: Secondary | ICD-10-CM | POA: Diagnosis not present

## 2015-11-01 DIAGNOSIS — L299 Pruritus, unspecified: Secondary | ICD-10-CM | POA: Diagnosis not present

## 2015-11-01 NOTE — Assessment & Plan Note (Signed)
-  PHQ 9 score 7 without suicidal or homicidal ideation -Continue Abilify 5 mg, Xanax 1-2 times per week -Zoloft restarted. To take 50 mg 1 week and then increase to 100 mg. Anticipate need to further titrate up. -Follow-up with Dr. Gwenlyn Saran on 11/16/2015.  -Follow-up in 4 weeks

## 2015-11-07 ENCOUNTER — Other Ambulatory Visit: Payer: Self-pay | Admitting: Family Medicine

## 2015-11-07 DIAGNOSIS — Z1231 Encounter for screening mammogram for malignant neoplasm of breast: Secondary | ICD-10-CM

## 2015-11-11 ENCOUNTER — Ambulatory Visit (INDEPENDENT_AMBULATORY_CARE_PROVIDER_SITE_OTHER): Payer: Medicare Other | Admitting: Family Medicine

## 2015-11-11 ENCOUNTER — Encounter: Payer: Self-pay | Admitting: Family Medicine

## 2015-11-11 VITALS — BP 146/84 | HR 93 | Temp 98.2°F | Ht 65.0 in | Wt 239.6 lb

## 2015-11-11 DIAGNOSIS — G8929 Other chronic pain: Secondary | ICD-10-CM

## 2015-11-11 DIAGNOSIS — M4716 Other spondylosis with myelopathy, lumbar region: Secondary | ICD-10-CM | POA: Diagnosis not present

## 2015-11-11 DIAGNOSIS — M5441 Lumbago with sciatica, right side: Secondary | ICD-10-CM

## 2015-11-11 DIAGNOSIS — M5442 Lumbago with sciatica, left side: Secondary | ICD-10-CM | POA: Diagnosis present

## 2015-11-11 DIAGNOSIS — Q762 Congenital spondylolisthesis: Secondary | ICD-10-CM | POA: Diagnosis not present

## 2015-11-11 MED ORDER — HYDROCODONE-ACETAMINOPHEN 5-325 MG PO TABS: 1.0000 | ORAL_TABLET | Freq: Four times a day (QID) | ORAL | 0 refills | Status: AC | PRN

## 2015-11-11 MED ORDER — KETOROLAC TROMETHAMINE 30 MG/ML IJ SOLN
30.0000 mg | Freq: Once | INTRAMUSCULAR | Status: AC
Start: 2015-11-11 — End: 2015-11-11
  Administered 2015-11-11: 30 mg via INTRAMUSCULAR

## 2015-11-11 MED ORDER — METHYLPREDNISOLONE ACETATE 40 MG/ML IJ SUSP
40.0000 mg | Freq: Once | INTRAMUSCULAR | Status: AC
Start: 2015-11-11 — End: 2015-11-11
  Administered 2015-11-11: 40 mg via INTRAMUSCULAR

## 2015-11-11 NOTE — Patient Instructions (Signed)
I HIGHLY recommend that you resume physical therapy as instructed by Dr Gerlean Ren.  I have provided some at home exercises you can be doing.  I recommend weight loss.  Plan to follow up with her for continue back pain needs.

## 2015-11-11 NOTE — Assessment & Plan Note (Signed)
[place] narcotic database reviewed.  Patient discussed with PCP.  Will rx 5 day supply of Norco.  Toradol 30/ Depo medrol 40 IM x1 today.  Reiterated need for PT, continued physical activity, weight loss.  Home exercise handout provided today.  No focal neurologic findings on exam.  No red flags.  Follow up with Spine clinic as scheduled for back injection.  Follow up with Dr Gerlean Ren for future narcotic medication needs.

## 2015-11-11 NOTE — Progress Notes (Signed)
   Subjective: CC: chronic back pain [id number] D Escobedo is a 60 y.o. female presenting to clinic today for same day appointment. PCP: Junie Panning, DO Concerns today include:  Chronic back pain Patient with h/o chronic back pain that began 2008.  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  She reports degenerative changes in her low back.  She was last seen by PCP 09/29/2015 at which time she was provided refills on Norco and given a Depo/Toradol injection.  She was referred to PT and [facility].  She notes that she is scheduled in 12/15/15 @830am  for a back injection.  They recommended that she continue oral therapies until she is seen at that time.  Patient reports that she is itching from hydrocodone.  Tramadol does not work.  She reports she has tried tylenol with codeine, with poor response.  She reports that percocet works.  She reports that she never went back to PT after last appointment.  Social History Reviewed: non smoker. FamHx and MedHx reviewed.  Please see EMR. Health Maintenance: shingles vaccine  ROS: Per HPI  Objective: Office vital signs reviewed. BP (!) 154/74   Pulse 93   Temp 98.2 F (36.8 C) (Oral)   Ht 5\' 5"  (1.651 m)   Wt 239 lb 9.6 oz (108.7 kg)   SpO2 97%   BMI 39.87 kg/m   Physical Examination:  General: Awake, alert, morbidly obese , No acute distress Spine: limited ROM in flexion, extension and left rotation. Mild scoliotic curve noted near T12/L1, +paraspinal TTP to lumbosacral spine, no midline TTP MSK: Normal gait and station Neuro: 4/5 LE strength bilaterally and light touch sensation grossly intact, patellar DTRs 1/4, normal toe and heel walks  Assessment/ Plan: 60 y.o. female   Chronic low back pain [place] narcotic database reviewed.  Patient discussed with PCP.  Will rx 5 day supply of Norco.  Toradol 30/ Depo medrol 40 IM x1  today.  Reiterated need for PT, continued physical activity, weight loss.  Home exercise handout provided today.  No focal neurologic findings on exam.  No red flags.  Follow up with Spine clinic as scheduled for back injection.  Follow up with Dr Gerlean Ren for future narcotic medication needs.  Janora Norlander, DO PGY-3, [facility]

## 2015-11-16 ENCOUNTER — Ambulatory Visit (INDEPENDENT_AMBULATORY_CARE_PROVIDER_SITE_OTHER): Payer: Medicare Other | Admitting: Psychology

## 2015-11-16 DIAGNOSIS — F330 Major depressive disorder, recurrent, mild: Secondary | ICD-10-CM

## 2015-11-16 NOTE — Progress Notes (Signed)
Reason for follow-up:  Mood Clinic Follow-up.  Depression tends to get worse this time of year in part due to anniversary reactions to the death of her daughter (and recently husband).  Patient felt herself getting more depressed recently and wanted to try to stop it before it got too bad.    Issues discussed:  Stacy Campbell is scheduled to come home 11/16.  Discussed at length.    Mood:  She is going into her "shell" where she stays in her room, shuts off her phone and cries a lot.  She reports a significant difference between when she is on and when she is off medicine.  Has been out of the Abilify but taking Zoloft 50 mg.  On the medicine, she reports days with bothersome depressed mood to be about 2 per month.    Identified goals:  Get back on medicine.  Take it regularly.  Keep an open mind regarding [name].  Look for opportunities for forgiveness.
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
## 2015-11-16 NOTE — Assessment & Plan Note (Signed)
Report of mood is depressed.  Affect is within normal limits.  She is tearful on occasion.  She engaged in change talk regarding taking her medicine and also her relationship with [name].    PHQ-9 is 9.  No evidence of suicidal or homicidal ideation.    Dr. Tammi Klippel recommended 5 mg of Abilify and eventually 150 mg of Zoloft which was a medication regimen she had reportedly done well with in the past.    See patient instructions for further plan.

## 2015-11-24 ENCOUNTER — Other Ambulatory Visit: Payer: Self-pay | Admitting: Orthopaedic Surgery

## 2015-11-24 DIAGNOSIS — Q762 Congenital spondylolisthesis: Secondary | ICD-10-CM

## 2015-11-25 ENCOUNTER — Ambulatory Visit (INDEPENDENT_AMBULATORY_CARE_PROVIDER_SITE_OTHER): Payer: Medicare Other | Admitting: Psychology

## 2015-11-25 DIAGNOSIS — F330 Major depressive disorder, recurrent, mild: Secondary | ICD-10-CM

## 2015-11-25 NOTE — Patient Instructions (Signed)
Please schedule a follow-up for:  December 1st at 8:30.  Call in the mean time if you need.  Consider nightly with [name] exchanging "gratitudes" - something that you are grateful for either about the day or about each other.  Glad you are doing better with your medicine.  Have a "grateful" Thanksgiving.

## 2015-11-25 NOTE — Assessment & Plan Note (Signed)
PHQ-9 of 8.  Functional indicator is very difficult.  Mood today is euthymic.  Affect is consistent.  She was touched by Quinton's response to [facility] coming home.  Attempting to take things one day at a time.  Keep positive.  [name] will be on probation and monitored for 6 months so hopefully Smt will have some support.  See patient instructions for further plan.

## 2015-11-25 NOTE — Progress Notes (Signed)
Reason for follow-up:  Follow-up mood issues.  Issues discussed:  [name] came home yesterday.  It has been 8 or so months since Migdalia has seen her.  She is trying to stay positive.  Reports taking medicine regularly.  Has plans for Thanksgiving.  Using online games to distract.  Identified goals:  Try to keep an open mind about the possibility of change in people.

## 2015-12-06 ENCOUNTER — Encounter: Payer: Self-pay | Admitting: Family Medicine

## 2015-12-06 ENCOUNTER — Ambulatory Visit (INDEPENDENT_AMBULATORY_CARE_PROVIDER_SITE_OTHER): Payer: Medicare Other | Admitting: Family Medicine

## 2015-12-06 VITALS — BP 140/78 | HR 89 | Temp 98.1°F | Ht 65.0 in | Wt 243.0 lb

## 2015-12-06 DIAGNOSIS — R739 Hyperglycemia, unspecified: Secondary | ICD-10-CM

## 2015-12-06 DIAGNOSIS — F330 Major depressive disorder, recurrent, mild: Secondary | ICD-10-CM | POA: Diagnosis not present

## 2015-12-06 DIAGNOSIS — M5442 Lumbago with sciatica, left side: Secondary | ICD-10-CM

## 2015-12-06 DIAGNOSIS — I10 Essential (primary) hypertension: Secondary | ICD-10-CM

## 2015-12-06 DIAGNOSIS — M5441 Lumbago with sciatica, right side: Secondary | ICD-10-CM

## 2015-12-06 DIAGNOSIS — G8929 Other chronic pain: Secondary | ICD-10-CM

## 2015-12-06 LAB — POCT GLYCOSYLATED HEMOGLOBIN (HGB A1C): HEMOGLOBIN A1C: 6.4

## 2015-12-06 LAB — CBC
HEMATOCRIT: 38.2 % (ref 35.0–45.0)
Hemoglobin: 12.3 g/dL (ref 11.7–15.5)
MCH: 24.5 pg — ABNORMAL LOW (ref 27.0–33.0)
MCHC: 32.2 g/dL (ref 32.0–36.0)
MCV: 75.9 fL — ABNORMAL LOW (ref 80.0–100.0)
MPV: 9.2 fL (ref 7.5–12.5)
PLATELETS: 191 10*3/uL (ref 140–400)
RBC: 5.03 MIL/uL (ref 3.80–5.10)
RDW: 14.6 % (ref 11.0–15.0)
WBC: 5.7 10*3/uL (ref 3.8–10.8)

## 2015-12-06 MED ORDER — KETOROLAC TROMETHAMINE 30 MG/ML IJ SOLN
30.0000 mg | Freq: Once | INTRAMUSCULAR | Status: AC
  Administered 2015-12-06: 30 mg via INTRAMUSCULAR

## 2015-12-06 NOTE — Assessment & Plan Note (Addendum)
Currently at goal. Continue current medication regimen of amlodipine, Coreg, hydrochlorothiazide. Will check BMP, CBC, A1c.

## 2015-12-06 NOTE — Assessment & Plan Note (Signed)
Seems to be coping well despite increased stress lately. PHQ 9 improved to 6 today, improved from 8 on November 17 and 9 on November 8. Continue Zoloft and Abilify. Follow-up with Dr. Gwenlyn Saran and Dr. Tammi Klippel as scheduled.

## 2015-12-06 NOTE — Patient Instructions (Signed)
Thank you so much for coming to visit today! We will keep you current blood pressure medications the same. We will check some blood work today. Please follow-up with Dr. Gwenlyn Saran as scheduled. Please give me a MRI tomorrow and see what the spinal specialists have to say. I am hopeful that after your back pain starts improving we can start working on your weight loss.  Dr. Gerlean Ren

## 2015-12-06 NOTE — Progress Notes (Signed)
Subjective:     Patient ID: Stacy Campbell, female   DOB: 1955-03-15, 60 y.o.   MRN: [id number]  HPI Stacy Campbell is a 60 year old female presenting today for follow-up of back pain, hypertension, depression. # Back Pain: Reports pain is 9 out of 10 in severity. Consistent with prior episodes. Reports she has a MRI tomorrow and then spinal specialist will give her a plan.   # Hypertension: Currently prescribed amlodipine 10 mg, Coreg 25 mg, hydrochlorothiazide 25 mg.denies headache, chest pain, shortness of breath, numbness, weakness.  # Depression: Reports [name] has returned home on November 16. The last week my has ran away and returned was also diagnosed and Chlamydia in emergency department visit. They have initiated the process of placing [name] in another care facility, this time with in the state of [place]. Currently on Zoloft and Abilify--reports this medication she would not help cope nearly as well with [name]'s behavior. Denies suicidal and homicidal ideation.  Review of Systems Per HPI    Objective:   Physical Exam  Constitutional: She appears well-developed and well-nourished. No distress.  HENT:  Head: Normocephalic and atraumatic.  Cardiovascular: Normal rate and regular rhythm.   No murmur heard. Pulmonary/Chest: Effort normal. No respiratory distress. She has no wheezes.  Abdominal: Soft. She exhibits no distension. There is no tenderness.  Musculoskeletal: She exhibits no edema.  Psychiatric: She has a normal mood and affect. Her behavior is normal.      Assessment and Plan:     HYPERTENSION, BENIGN SYSTEMIC Currently at goal. Continue current medication regimen of amlodipine, Coreg, hydrochlorothiazide. Will check BMP, CBC, A1c.  Chronic low back pain Currently followed by spinal specialist. MRI planned for tomorrow. Further plans to follow imaging. Toradol given in visit today.  Depression Seems to be coping well despite increased stress lately.  PHQ 9 improved to 6 today, improved from 8 on November 17 and 9 on November 8. Continue Zoloft and Abilify. Follow-up with Dr. Gwenlyn Saran and Dr. Tammi Klippel as scheduled.

## 2015-12-06 NOTE — Assessment & Plan Note (Signed)
Currently followed by spinal specialist. MRI planned for tomorrow. Further plans to follow imaging. Toradol given in visit today.

## 2015-12-07 ENCOUNTER — Ambulatory Visit
Admission: RE | Admit: 2015-12-07 | Discharge: 2015-12-07 | Disposition: A | Discharge status: Home / Self Care | Payer: Medicare Other | Source: Ambulatory Visit | Attending: Orthopaedic Surgery | Admitting: Orthopaedic Surgery

## 2015-12-07 ENCOUNTER — Other Ambulatory Visit: Payer: Self-pay | Admitting: Family Medicine

## 2015-12-07 DIAGNOSIS — Q762 Congenital spondylolisthesis: Secondary | ICD-10-CM

## 2015-12-07 DIAGNOSIS — M48061 Spinal stenosis, lumbar region without neurogenic claudication: Secondary | ICD-10-CM | POA: Diagnosis not present

## 2015-12-07 LAB — BASIC METABOLIC PANEL WITH GFR
BUN: 13 mg/dL (ref 7–25)
CALCIUM: 9.2 mg/dL (ref 8.6–10.4)
CO2: 28 mmol/L (ref 20–31)
Chloride: 102 mmol/L (ref 98–110)
Creat: 0.71 mg/dL (ref 0.50–0.99)
GFR, Est African American: >89 mL/min (ref >=60)
Glucose, Bld: 88 mg/dL (ref 65–99)
Potassium: 4 mmol/L (ref 3.5–5.3)
SODIUM: 140 mmol/L (ref 135–146)

## 2015-12-07 NOTE — Telephone Encounter (Signed)
Can you take care of this refill, since she is your patient and not [facility].  Thanks!

## 2015-12-09 ENCOUNTER — Ambulatory Visit (INDEPENDENT_AMBULATORY_CARE_PROVIDER_SITE_OTHER): Payer: Medicare Other | Admitting: Psychology

## 2015-12-09 DIAGNOSIS — F330 Major depressive disorder, recurrent, mild: Secondary | ICD-10-CM

## 2015-12-09 NOTE — Progress Notes (Signed)
Reason for follow-up:  Stacy Campbell present for follow-up for mood issues.   Issues discussed:  Taking both Abilify and Zoloft daily with no missed doses.  Tolerating the medicine well.  Thinks it has been beneficial.  Family members noted a change in behavior on Thanksgiving.  Stacy Campbell has been back for about two weeks.  Made some poor choices and is likely leaving the home again.  Identified goals:  Keep calm and yet honest about her emotions regarding [name].  Use prayer as a tool for combating her sense of powerlessness over this situation.

## 2015-12-09 NOTE — Patient Instructions (Signed)
Please schedule a follow-up for:  December 15th at 8:30.   So sorry to hear about Stacy Campbell's continued poor choices.  I am glad you are weathering this storm as well as you are.

## 2015-12-09 NOTE — Assessment & Plan Note (Signed)
Report of mood is stable and largely positive.  Affect is brighter than usual today.  She is starting a new medicine for her itching.  Stacy Campbell thinks the medicine for her mood has been effective in helping her manage her emotions, especially when it comes to [place].  I think she continues to benefit from regular, brief check-ins to validate her experiences and reinforce her coping.  She elected to schedule in 2 weeks.

## 2015-12-12 ENCOUNTER — Ambulatory Visit
Admission: RE | Admit: 2015-12-12 | Discharge: 2015-12-12 | Disposition: A | Discharge status: Home / Self Care | Payer: Medicare Other | Source: Ambulatory Visit | Attending: Family Medicine | Admitting: Family Medicine

## 2015-12-12 DIAGNOSIS — Z1231 Encounter for screening mammogram for malignant neoplasm of breast: Secondary | ICD-10-CM

## 2015-12-13 ENCOUNTER — Other Ambulatory Visit: Payer: Self-pay | Admitting: Family Medicine

## 2015-12-15 DIAGNOSIS — L298 Other pruritus: Secondary | ICD-10-CM | POA: Diagnosis not present

## 2015-12-15 DIAGNOSIS — L2089 Other atopic dermatitis: Secondary | ICD-10-CM | POA: Diagnosis not present

## 2015-12-15 DIAGNOSIS — M4316 Spondylolisthesis, lumbar region: Secondary | ICD-10-CM | POA: Diagnosis not present

## 2015-12-15 DIAGNOSIS — M47816 Spondylosis without myelopathy or radiculopathy, lumbar region: Secondary | ICD-10-CM | POA: Diagnosis not present

## 2015-12-15 DIAGNOSIS — M545 Low back pain: Secondary | ICD-10-CM | POA: Diagnosis not present

## 2015-12-23 ENCOUNTER — Ambulatory Visit (INDEPENDENT_AMBULATORY_CARE_PROVIDER_SITE_OTHER): Payer: Medicare Other | Admitting: Psychology

## 2015-12-23 DIAGNOSIS — F330 Major depressive disorder, recurrent, mild: Secondary | ICD-10-CM

## 2015-12-23 NOTE — Assessment & Plan Note (Signed)
Report of mood is euthymic.  Affect is a bit flat  She reports significant pain.  Has an appointment at a spine doctor 12/18 that she is looking forward to in terms of potential answers.  Seems to be handling the situation with [facility] well given the significant stress it causes.  Has plans for the holidays.  Will follow after - in January.  See patient instructions for further plan.

## 2015-12-23 NOTE — Progress Notes (Signed)
Reason for follow-up:  Continued work on managing mood.    Issues discussed:  Stacy Campbell is still home.  Spent a week at [employer] recently for running away (and then stating she wanted to hurt herself).  Stacy Campbell has been thinking a lot about Stacy Campbell.  Overall reports doing fairly well with this.    Identified goals:  Enjoy the holidays.  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

## 2015-12-23 NOTE — Patient Instructions (Signed)
Please schedule a follow-up for January 5th at 8:30.    Have a great holiday.

## 2015-12-26 DIAGNOSIS — M47816 Spondylosis without myelopathy or radiculopathy, lumbar region: Secondary | ICD-10-CM | POA: Diagnosis not present

## 2015-12-29 DIAGNOSIS — L309 Dermatitis, unspecified: Secondary | ICD-10-CM | POA: Diagnosis not present

## 2015-12-29 DIAGNOSIS — L299 Pruritus, unspecified: Secondary | ICD-10-CM | POA: Diagnosis not present

## 2015-12-29 DIAGNOSIS — L810 Postinflammatory hyperpigmentation: Secondary | ICD-10-CM | POA: Diagnosis not present

## 2015-12-29 DIAGNOSIS — L2081 Atopic neurodermatitis: Secondary | ICD-10-CM | POA: Diagnosis not present

## 2016-01-05 ENCOUNTER — Other Ambulatory Visit: Payer: Self-pay | Admitting: Gastroenterology

## 2016-01-05 DIAGNOSIS — K7460 Unspecified cirrhosis of liver: Secondary | ICD-10-CM

## 2016-01-11 ENCOUNTER — Ambulatory Visit
Admission: RE | Admit: 2016-01-11 | Discharge: 2016-01-11 | Disposition: A | Discharge status: Home / Self Care | Payer: Medicare Other | Source: Ambulatory Visit | Attending: Gastroenterology | Admitting: Gastroenterology

## 2016-01-11 ENCOUNTER — Other Ambulatory Visit: Payer: Medicare Other

## 2016-01-11 DIAGNOSIS — K7460 Unspecified cirrhosis of liver: Secondary | ICD-10-CM

## 2016-01-12 ENCOUNTER — Other Ambulatory Visit: Payer: Self-pay | Admitting: Family Medicine

## 2016-01-12 DIAGNOSIS — L2089 Other atopic dermatitis: Secondary | ICD-10-CM | POA: Diagnosis not present

## 2016-01-12 DIAGNOSIS — Z79899 Other long term (current) drug therapy: Secondary | ICD-10-CM | POA: Diagnosis not present

## 2016-01-13 ENCOUNTER — Ambulatory Visit: Payer: Medicare HMO

## 2016-01-13 ENCOUNTER — Telehealth: Payer: Self-pay | Admitting: Psychology

## 2016-01-13 NOTE — Telephone Encounter (Signed)
Stacy Campbell missed her appointment today.  I called and left a VM.

## 2016-01-17 NOTE — Telephone Encounter (Signed)
Needs refill on xanax.  [facility] [address]

## 2016-01-19 NOTE — Telephone Encounter (Signed)
Pt informed. Zimmerman Rumple, Abigail Marsiglia D, CMA  

## 2016-01-19 NOTE — Telephone Encounter (Signed)
Prescription printed and left at front desk.

## 2016-01-19 NOTE — Telephone Encounter (Signed)
2nd request. ep °

## 2016-01-20 ENCOUNTER — Ambulatory Visit (INDEPENDENT_AMBULATORY_CARE_PROVIDER_SITE_OTHER): Payer: Medicare HMO | Admitting: Psychology

## 2016-01-20 DIAGNOSIS — F330 Major depressive disorder, recurrent, mild: Secondary | ICD-10-CM
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
## 2016-01-20 NOTE — Patient Instructions (Signed)
Please schedule a follow-up for:  January 26th at 9:00.  So glad to hear about [employer].  I'll be interested to hear how it went.  We talked about feeling hopeless and powerless.  You sound like you are doing a good job of taking a look at what you can do in this terrible situation and when you have done what you can - turning over the rest.  This is not your path.  And she is not accepting your influence (or anyone else's for that matter).

## 2016-01-20 NOTE — Assessment & Plan Note (Signed)
PHQ-9 a 6 which is surprising given the chronicity and severity of this stress.  Stacy Campbell reports being well supported by family members.  She plans to join [employer] on Monday as part of the silver sneakers program.  She thinks sitting in the house playing computer games isn't enough to do on a daily basis.  Provided encouragement around this initiative.    Support provided by regular Behavioral Health appointments seems to play a role in managing Stacy Campbell's mood.  When offered a follow-up for two weeks to a month, she chose two weeks.  See patient instructions for further plan.

## 2016-01-24 DIAGNOSIS — M47816 Spondylosis without myelopathy or radiculopathy, lumbar region: Secondary | ICD-10-CM | POA: Diagnosis not present

## 2016-01-31 DIAGNOSIS — L2089 Other atopic dermatitis: Secondary | ICD-10-CM | POA: Diagnosis not present

## 2016-01-31 DIAGNOSIS — L299 Pruritus, unspecified: Secondary | ICD-10-CM | POA: Diagnosis not present

## 2016-02-03 ENCOUNTER — Ambulatory Visit (INDEPENDENT_AMBULATORY_CARE_PROVIDER_SITE_OTHER): Payer: Medicare HMO | Admitting: Psychology

## 2016-02-03 DIAGNOSIS — F330 Major depressive disorder, recurrent, mild: Secondary | ICD-10-CM

## 2016-02-03 NOTE — Assessment & Plan Note (Signed)
PHQ-9 of 3 is surprisingly low given her presentation and report today.  She looks fatigued and flat.  She does smile on occasion.  She reports being especially tired today.  Revisited the selective numbing of emotions and how a flattening of negative emotions automatically mutes more positive emotion.  I am not sure an alternative here though.  The best strategy at this point might simply be to numb her emotional response hoping that at some point, [name] will be again removed from the house.    Of note, Stacy Campbell reports that our session are the main thing that keeps her feeling reasonably well.  We are not engaging in any solution focused work and goal identification and achievement has been a challenge.  At this point, I am okay with providing psychosocial support on a regular basis for this patient while continuing to be on the look out for goals to work toward.

## 2016-02-03 NOTE — Patient Instructions (Signed)
See you in two weeks.  Hang in there.
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
## 2016-02-06 ENCOUNTER — Ambulatory Visit: Payer: Medicare HMO | Admitting: Psychology

## 2016-02-08 ENCOUNTER — Ambulatory Visit (INDEPENDENT_AMBULATORY_CARE_PROVIDER_SITE_OTHER): Payer: Medicare HMO | Admitting: Family Medicine

## 2016-02-08 VITALS — BP 137/76 | HR 109 | Temp 98.1°F | Wt 250.2 lb

## 2016-02-08 DIAGNOSIS — M545 Low back pain, unspecified: Secondary | ICD-10-CM

## 2016-02-08 DIAGNOSIS — R609 Edema, unspecified: Secondary | ICD-10-CM | POA: Diagnosis not present

## 2016-02-08 DIAGNOSIS — L03114 Cellulitis of left upper limb: Secondary | ICD-10-CM

## 2016-02-08 LAB — CBC WITH DIFFERENTIAL/PLATELET
BASOS PCT: 0 %
Basophils Absolute: 0 cells/uL (ref 0–200)
EOS ABS: 159 {cells}/uL (ref 15–500)
EOS PCT: 3 %
HCT: 38.1 % (ref 35.0–45.0)
Hemoglobin: 12.1 g/dL (ref 11.7–15.5)
LYMPHS PCT: 15 %
Lymphs Abs: 795 cells/uL — ABNORMAL LOW (ref 850–3900)
MCH: 23.8 pg — ABNORMAL LOW (ref 27.0–33.0)
MCHC: 31.8 g/dL — ABNORMAL LOW (ref 32.0–36.0)
MCV: 74.9 fL — AB (ref 80.0–100.0)
MONOS PCT: 8 %
MPV: 9.1 fL (ref 7.5–12.5)
Monocytes Absolute: 424 cells/uL (ref 200–950)
Neutro Abs: 3922 cells/uL (ref 1500–7800)
Neutrophils Relative %: 74 %
PLATELETS: 182 10*3/uL (ref 140–400)
RBC: 5.09 MIL/uL (ref 3.80–5.10)
RDW: 14.8 % (ref 11.0–15.0)
WBC: 5.3 10*3/uL (ref 3.8–10.8)

## 2016-02-08 LAB — TSH: TSH: 2.73 m[IU]/L

## 2016-02-08 MED ORDER — HYDROCODONE-ACETAMINOPHEN 5-325 MG PO TABS: 1.0000 | ORAL_TABLET | Freq: Four times a day (QID) | ORAL | 0 refills | Status: DC | PRN

## 2016-02-08 MED ORDER — FUROSEMIDE 20 MG PO TABS: 20.0000 mg | ORAL_TABLET | Freq: Every day | ORAL | 0 refills | Status: DC

## 2016-02-08 MED ORDER — CEPHALEXIN 500 MG PO CAPS: 500.0000 mg | ORAL_CAPSULE | Freq: Two times a day (BID) | ORAL | 0 refills | Status: DC

## 2016-02-08 NOTE — Patient Instructions (Addendum)
Thank you so much for coming to visit today! You have several things to discuss today! For your back pain, we will give you a shot of Toradol today. I have given you a five day course of your pain medication. I have placed a referral to another Neurosurgeon for a second opinion. I suspect you have cellulitis of your left arm. I have sent a prescription for Keflex to the pharmacy. If no improvement over the next several days or if your symptoms worsen on antibiotics, please let us know.  For your leg edema, we will check several labs today and get an Echocardiogram. I have sent a prescription for Lasix to take a 3 day course--this medication should make you pee, but if it doesn't let me know and we will increase the dose. If you develop shortness of breath, please return as soon as possible.  Please follow up in one week.  Dr. Gerlean Ren

## 2016-02-08 NOTE — Progress Notes (Signed)
Subjective:     Patient ID: Stacy Campbell, female   DOB: 09/15/1955, 61 y.o.   MRN: [id number]  HPI Mrs. Andring is a 61yo female presenting today for multiple concerns.  # Low Back Pain: [facility], where she has been receiving injections. Noted significant improvement with first round of injections, but notes worsening of pain following second round last week. Discussed with neurosurgery, who stated the injections were not always effective. Requests second opinion to see what other options are available. Notes back pain results in significant limitations of ADLs, stating she has been unable to do chores or do her grocery shopping due to the pain. Has been using Tylenol without relief. Reports no relief in past with Flexeril. MRI lumbar spine in 11/2015 with mild stenosis at L3-L4, mild facet degeneration L4-L5, right sided disc protrusion at L5-S1 displacing the S1 nerve root, and bilateral facet degeneration at L5-S1.  # Left Arm Cellulitis: Reports injection at Dermatology for Psoriasis last Friday. Yesterday, noted increasing warmth, swelling, redness, and pain over site of injection on left arm. Has not tried any medications. Denies fever.   # Lower Extremity Edema: Notes worsening lower extremity edema for the last two days. Denies history of prior. Denies orthopnea or paroxysmal nocturnal dyspnea. Denies chest pain or palpitations. Notes shortness of breath with exertion.  Review of Systems Per HPI    Objective:   Physical Exam  Constitutional: She appears well-developed and well-nourished. No distress.  Cardiovascular: Normal rate and regular rhythm.   No murmur heard. Pulmonary/Chest: Effort normal. No respiratory distress. She has no wheezes.  Abdominal: Soft. She exhibits no distension. There is no tenderness.  Musculoskeletal:  3+ pitting edema to knee. Tenderness over left lumbar paraspinal muscles and left piriformis  Skin:   Diffuse psoriasis. Cellulitis noted on lateral and posterior left upper arm.   Psychiatric: She has a normal mood and affect. Her behavior is normal.      Assessment and Plan:     1. Edema, unspecified type Will initiate workup. Will obtain CMP, CBC, TSH, BNP. Echo ordered. Lasix 20mg  x3 days prescribed. Follow up in one week.   2. Acute left-sided low back pain without sciatica Toradol given in clinic. 5 day prescription for Norco given following review of [place] [employer] database without red flags. Referral placed to Neurosurgery for second opinion.  3. Cellulitis of left upper extremity Keflex prescribed. Follow up in one week or sooner if no improvement or worsening occurs.

## 2016-02-09 LAB — COMPLETE METABOLIC PANEL WITH GFR
ALBUMIN: 3.7 g/dL (ref 3.6–5.1)
ALK PHOS: 79 U/L (ref 33–130)
ALT: 15 U/L (ref 6–29)
AST: 17 U/L (ref 10–35)
BILIRUBIN TOTAL: 0.2 mg/dL (ref 0.2–1.2)
BUN: 10 mg/dL (ref 7–25)
CALCIUM: 8.8 mg/dL (ref 8.6–10.4)
CO2: 27 mmol/L (ref 20–31)
CREATININE: 0.91 mg/dL (ref 0.50–0.99)
Chloride: 107 mmol/L (ref 98–110)
GFR, EST AFRICAN AMERICAN: 79 mL/min (ref >=60)
GFR, Est Non African American: 69 mL/min (ref >=60)
Glucose, Bld: 108 mg/dL — ABNORMAL HIGH (ref 65–99)
Potassium: 4.1 mmol/L (ref 3.5–5.3)
Sodium: 145 mmol/L (ref 135–146)
TOTAL PROTEIN: 7.4 g/dL (ref 6.1–8.1)

## 2016-02-09 LAB — BRAIN NATRIURETIC PEPTIDE: Brain Natriuretic Peptide: 14 pg/mL (ref <100)

## 2016-02-10 ENCOUNTER — Encounter: Payer: Self-pay | Admitting: Family Medicine

## 2016-02-10 ENCOUNTER — Telehealth: Payer: Self-pay

## 2016-02-10 MED ORDER — KETOROLAC TROMETHAMINE 30 MG/ML IJ SOLN
30.0000 mg | Freq: Once | INTRAMUSCULAR | Status: AC
  Administered 2016-02-08: 15 mg via INTRAMUSCULAR

## 2016-02-10 NOTE — Addendum Note (Signed)
Addended by: Leonia Corona R on: 02/10/2016 12:26 PM   Modules accepted: Orders

## 2016-02-14 DIAGNOSIS — Z872 Personal history of diseases of the skin and subcutaneous tissue: Secondary | ICD-10-CM | POA: Diagnosis not present

## 2016-02-14 DIAGNOSIS — L853 Xerosis cutis: Secondary | ICD-10-CM | POA: Diagnosis not present

## 2016-02-14 DIAGNOSIS — L2089 Other atopic dermatitis: Secondary | ICD-10-CM | POA: Diagnosis not present

## 2016-02-17 ENCOUNTER — Ambulatory Visit (INDEPENDENT_AMBULATORY_CARE_PROVIDER_SITE_OTHER): Payer: Medicare HMO | Admitting: Psychology

## 2016-02-17 DIAGNOSIS — F330 Major depressive disorder, recurrent, mild: Secondary | ICD-10-CM
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
## 2016-02-17 NOTE — Assessment & Plan Note (Signed)
Appears similar to last visit - tired and flat.  She does report that she feels like she is boiling inside when [place] interacts with her in a certain way but just contains it.  I think this does result in an overall deadening of emotions.    Discussed possibility of scheduling pleasant events away from [place].  She says family members have offered but have not yet followed through.  Encouraged her to be proactive.

## 2016-02-17 NOTE — Patient Instructions (Signed)
Please schedule a follow-up for:  2/23 at 9:00.    Please schedule pleasant activities with family members.  You may need to call them rather than wait for them to get in contact with you.  Getting out of the house and away from this situation is one of the few things you might be able to do.

## 2016-02-21 ENCOUNTER — Ambulatory Visit (HOSPITAL_COMMUNITY): Payer: Medicare HMO

## 2016-02-29 ENCOUNTER — Ambulatory Visit (HOSPITAL_COMMUNITY): Admission: RE | Admit: 2016-02-29 | Payer: Medicare HMO | Source: Ambulatory Visit

## 2016-03-02 ENCOUNTER — Ambulatory Visit (INDEPENDENT_AMBULATORY_CARE_PROVIDER_SITE_OTHER): Payer: Medicare HMO | Admitting: Psychology

## 2016-03-02 DIAGNOSIS — F330 Major depressive disorder, recurrent, mild: Secondary | ICD-10-CM

## 2016-03-02 NOTE — Progress Notes (Signed)
Reason for follow-up:  Continued management of chronic health issues.  Issues discussed:  Jarrod did manage to get out of the house for a fun activity with a daughter but [name] called her repeatedly, left school, was picked up by a Engineer, structural, and Nelda had to come back to get her.  This was very frustrating.  She has plans again today and has let Henry know not to contact her.  Things remain very difficult.  [name] is not attending school regularly, Rayelle continues to call the police every time she stays out over night.  She is regularly staying away from home for multiple nights in a row.

## 2016-03-02 NOTE — Assessment & Plan Note (Signed)
.  Phq-9 of 7 today which is down a bit from last visit.  Affect is brighter today.  Situation hasn't changed but likely patient's reaction to situation is variable based on sleep, pain, and other factors.  She has something fun planned today away from the house which is helpful to her.  Identifies that staying in her bedroom too long is bad for her but has difficulty being in [name]'s presence.  She continues to find meetings with me helpful for the validation.  I can't think of anything else that could be done to try to shift the situation with [facility].  Will continue to provide support bi-weekly and encourage scheduled pleasant activities.

## 2016-03-16 ENCOUNTER — Ambulatory Visit: Payer: Medicare HMO

## 2016-03-21 ENCOUNTER — Other Ambulatory Visit: Payer: Self-pay | Admitting: Family Medicine

## 2016-03-21 DIAGNOSIS — G8929 Other chronic pain: Secondary | ICD-10-CM | POA: Diagnosis not present

## 2016-03-21 DIAGNOSIS — Z6841 Body Mass Index (BMI) 40.0 and over, adult: Secondary | ICD-10-CM | POA: Diagnosis not present

## 2016-03-21 DIAGNOSIS — M545 Low back pain: Secondary | ICD-10-CM | POA: Diagnosis not present

## 2016-03-21 DIAGNOSIS — I10 Essential (primary) hypertension: Secondary | ICD-10-CM | POA: Diagnosis not present

## 2016-03-21 NOTE — Telephone Encounter (Signed)
Pt calling to request refill of:  Name of Medication(s):  Xanax & Ambien Last date of OV:   Pharmacy:  02-08-16  Will route refill request to Clinic RN.  Discussed with patient policy to call pharmacy for future refills.  Also, discussed refills may take up to 48 hours to approve or deny.  Renella Cunas

## 2016-03-22 ENCOUNTER — Other Ambulatory Visit: Payer: Self-pay | Admitting: Family Medicine

## 2016-03-23 ENCOUNTER — Ambulatory Visit (INDEPENDENT_AMBULATORY_CARE_PROVIDER_SITE_OTHER): Payer: Medicare HMO | Admitting: Psychology

## 2016-03-23 DIAGNOSIS — F331 Major depressive disorder, recurrent, moderate: Secondary | ICD-10-CM

## 2016-03-23 NOTE — Patient Instructions (Signed)
I am so sorry you are having such a tough time.  I am not surprised.  You have been through it and this dip in your mood makes sense in a lot of ways.  You thought two things you could do to help shift things a bit are heading to the movies with Angie this Saturday and getting rid of all of the sodas and (most of) the junk food in the house.  Let's focus on one week.  I'll call you next Wednesday or Thursday to check in.  Feel free to call me to let me know how it is going.  Otherwise - I'll see you 3/29 at 8:30.    Good luck.  Be kind to yourself.

## 2016-03-23 NOTE — Assessment & Plan Note (Signed)
Mood is reported as depressed.  Affect is consistent.  She looks different than the last few times I have seen her.  She doesn't have a sense of what this is about.  I think it could be related to the change in her home environment.  Even though she is relieved (glad) that Stacy Campbell is gone and should be gone for a significant period of time, the four months leading up to this change has been severely (as in mind boggling) stressful.  Stacy Campbell has weathered this my dampening down her emotional response.  I think her current downturn in mood might be related.  While medication changes are an option, I suggested that Stacy Campbell might try some behavioral strategies.  Used MI techniques to help her identify two.  She is going to go to the movies with a friend and change her eating habits (stop soda and limit junk food in the house).  I won't be here the next three Fridays so I will call next week to check-in and see her the following Thursday.  See patient instructions for further plan.

## 2016-03-23 NOTE — Progress Notes (Signed)
Reason for follow-up:  Continued mood management in the context of significant life stressors.    Issues discussed:  [name] was taken to a facility in [facility] area a week ago today.  She did not no she would be left there until after they arrived.  A few days before this, Cheyanne noticed a downturn in mood - more tearful, less motivated, eating more junk food, isolating.

## 2016-03-26 ENCOUNTER — Encounter: Payer: Self-pay | Admitting: Family Medicine

## 2016-03-26 ENCOUNTER — Ambulatory Visit (INDEPENDENT_AMBULATORY_CARE_PROVIDER_SITE_OTHER): Payer: Medicare HMO | Admitting: Family Medicine

## 2016-03-26 VITALS — BP 150/82 | HR 76 | Temp 98.3°F | Ht 65.0 in | Wt 245.2 lb

## 2016-03-26 DIAGNOSIS — R739 Hyperglycemia, unspecified: Secondary | ICD-10-CM | POA: Diagnosis not present

## 2016-03-26 DIAGNOSIS — R399 Unspecified symptoms and signs involving the genitourinary system: Secondary | ICD-10-CM

## 2016-03-26 DIAGNOSIS — M545 Low back pain: Secondary | ICD-10-CM

## 2016-03-26 DIAGNOSIS — G8929 Other chronic pain: Secondary | ICD-10-CM | POA: Diagnosis not present

## 2016-03-26 LAB — POCT GLYCOSYLATED HEMOGLOBIN (HGB A1C): HEMOGLOBIN A1C: 6.2

## 2016-03-26 MED ORDER — ALPRAZOLAM 1 MG PO TABS: ORAL_TABLET | ORAL | 1 refills | Status: DC

## 2016-03-26 MED ORDER — HYDROCODONE-ACETAMINOPHEN 5-325 MG PO TABS: 1.0000 | ORAL_TABLET | Freq: Four times a day (QID) | ORAL | 0 refills | Status: DC | PRN

## 2016-03-26 MED ORDER — ZOLPIDEM TARTRATE 10 MG PO TABS: 10.0000 mg | ORAL_TABLET | Freq: Every evening | ORAL | 5 refills | Status: DC | PRN

## 2016-03-26 MED ORDER — METHYLPREDNISOLONE ACETATE 40 MG/ML IJ SUSP
40.0000 mg | Freq: Once | INTRAMUSCULAR | Status: AC
  Administered 2016-03-26: 40 mg via INTRAMUSCULAR

## 2016-03-26 MED ORDER — KETOROLAC TROMETHAMINE 60 MG/2ML IM SOLN
60.0000 mg | Freq: Once | INTRAMUSCULAR | Status: AC
  Administered 2016-03-26: 60 mg via INTRAMUSCULAR

## 2016-03-26 NOTE — Patient Instructions (Signed)
Thank you so much for coming to visit today! Your back pain seems pretty bad today! We will give you two shots here in the office and a pain medication to take by mouth. I have placed a referral to Physical Therapy. I have refilled your anxiety medicine and sleep medicine. Please note that both of these and your pain medication all increase your risk of falls.  Please return in two weeks if no improvement or sooner if symptoms worsen.  Dr. Gerlean Ren

## 2016-03-27 NOTE — Progress Notes (Signed)
Subjective:     Patient ID: Stacy Campbell, female   DOB: 1955-06-15, 61 y.o.   MRN: [id number]  HPI [name] is a 61yo female presenting for low back pain.  Notes constant back pain since last office visit in January 2018. Pain is located over he left back and buttock. Denies weakness or numbness, including saddle anesthesia. Denies any changes in urinary incontinence, reporting she has baseline incontinence with laughing and coughing for the last several years. Requests steroid and Toradol injections today, which has helped in the past.   Was evaluated by neurosurgery on 02/29/16 and states she was told there isn't anything they can do and she was diagnosed with arthritis and bone spurs; they recommended physical therapy. [name] tried physical therapy several years ago and has been reluctant to try it again over the last few office visits, but is amenable to trying it again since she feels she is running out of options for long term relief of pain.   Pain limits her ability to leave the house. Has history of anxiety and depression and was given goal to get out of the house to do something she finds fun, but her back pain has made this difficult. Back pain also makes it difficult for her to do her chores. Her anxiety has been increased lately due to [facility] [place]. Requests refill of Xanax and Ambien. Currently follows in [facility].  Former Smoker  Review of Systems Per HPI.    Objective:   Physical Exam  Constitutional: She appears well-developed and well-nourished. No distress.  HENT:  Head: Normocephalic and atraumatic.  Cardiovascular: Normal rate and regular rhythm.   No murmur heard. Pulmonary/Chest: Effort normal. No respiratory distress. She has no wheezes.  Abdominal: Soft. She exhibits no distension. There is no tenderness.  Musculoskeletal: She exhibits no edema.  Tenderness over lumbar spine and left lumbar paraspinal muscles  and buttocks. Positive straight leg raise on left, negative on right. Muscle strength 5/5 in lower extremities.  Neurological:  Sensation intact. Hyperesthesia of left lower back and buttocks noted with minimal touch.   Psychiatric: She has a normal mood and affect. Her behavior is normal.       Assessment and Plan:     1. Low Back Pain: Consistent with prior back pain flares, however now with new hyperesthesia not noted on previous exams. Will treat with injections of Toradol and Depo Medrol today, refill of Norco x5 days ([place] Narcotic database reviewed). Was hopeful Neurosurgery would have a solution for her chronic pain and that her pain exacerbations would begin spacing further apart, however it appears they have few options for her pain. She is amenable to physical therapy now, which is a breakthrough for her--referral to physical therapy placed.  Given new findings of hyperesthesia, there is some concern that her chronic back pain is now showing signs of Complex Regional Pain Syndrome. If the above pain regimen, which has worked in the past, shows no improvement, will initiating treatment for CRPS. Has already tried courses of Gabapentin and Lyrica in the past with little improvement, however given new symptom may be worth retrying. Ambien and Xanax refilled.   2. Urinary Symptoms: Unfortunately did not have time to fully explore this. Note urinary symptoms are not correlated with back pain symptoms. Symptoms unchanged over the last several years.  King's Health Questionnaire pan negative. Pelvic Floor Distress Inventory Questionnaire: Sometimes experiences feeling of incomplete bladder emptying. Sometimes feels  she has not completely emptied bowels after bowel movement. Sometimes experiences urgency and occasionally has urine leakage with urgency. Moderate amount of urinary leakage. Quite a bit of urine leakage with coughing, sneezing, laughing.  Based on questionnaires above, suspect stress  and urge incontinence. Will further discuss and possibly initiate treatment at next office visit.

## 2016-03-29 DIAGNOSIS — L2089 Other atopic dermatitis: Secondary | ICD-10-CM | POA: Diagnosis not present

## 2016-03-29 DIAGNOSIS — Z79899 Other long term (current) drug therapy: Secondary | ICD-10-CM | POA: Diagnosis not present

## 2016-03-29 DIAGNOSIS — Z872 Personal history of diseases of the skin and subcutaneous tissue: Secondary | ICD-10-CM | POA: Diagnosis not present

## 2016-04-02 ENCOUNTER — Telehealth: Payer: Self-pay | Admitting: Psychology

## 2016-04-02 NOTE — Telephone Encounter (Signed)
Called patient to follow up on behavioral goals she set for herself.  She stated she went to see [employer] as planned with her friend Angie.  That went well.  She also reports she has not had any sodas or junk food since our meeting.  She doesn't really think about it per her report.    I am scheduled to see her on Thursday.  Will follow up more then.

## 2016-04-03 ENCOUNTER — Ambulatory Visit: Payer: Medicare HMO | Attending: Family Medicine

## 2016-04-03 DIAGNOSIS — R293 Abnormal posture: Secondary | ICD-10-CM | POA: Insufficient documentation

## 2016-04-03 DIAGNOSIS — M62830 Muscle spasm of back: Secondary | ICD-10-CM | POA: Diagnosis not present

## 2016-04-03 DIAGNOSIS — M6281 Muscle weakness (generalized): Secondary | ICD-10-CM | POA: Diagnosis not present

## 2016-04-03 DIAGNOSIS — M545 Low back pain: Secondary | ICD-10-CM | POA: Insufficient documentation

## 2016-04-03 DIAGNOSIS — G8929 Other chronic pain: Secondary | ICD-10-CM

## 2016-04-03 DIAGNOSIS — R262 Difficulty in walking, not elsewhere classified: Secondary | ICD-10-CM

## 2016-04-03 NOTE — Therapy (Signed)
[place], [place], [address] Phone: [phone]   Fax:  [phone]  Physical Therapy Evaluation  Patient Details  Name: Stacy Campbell MRN: [id number] Date of Birth: 24-Dec-1955 Referring Provider: Junie Panning DO  Encounter Date: 04/03/2016      PT End of Session - 04/03/16 0939    Visit Number 1   Number of Visits 12   Date for PT Re-Evaluation 05/18/16   Authorization Type [employer] MCR   PT Start Time 0845   PT Stop Time 0930   PT Time Calculation (min) 45 min   Activity Tolerance Patient tolerated treatment well;No increased pain   Behavior During Therapy WFL for tasks assessed/performed      Past Medical History:  Diagnosis Date  . Anxiety   . Arthritis   . Asthma   . Bone spur    spine  . Depression   . Eczema   . Hypertension   . Insomnia     History reviewed. No pertinent surgical history.  There were no vitals filed for this visit.       Subjective Assessment - 04/03/16 0846    Subjective She reports LBP on LT with spurs and OA.  She has been to PT in past 4-5 times  She has TENs unit. IT helps sometimes.   She stretches but not much as it hurts worse. .  She has seen spine surgeons and has bad case of OA.  She has not had cortisone injections yet.    ..    Limitations Standing   How long can you sit comfortably? 60 min   How long can you stand comfortably? 5 min    How long can you walk comfortably? 1/2 mile   Diagnostic tests xray;/ MRI:  degenerative changes with mild stenosis    Patient Stated Goals She wants to get pain under control   Currently in Pain? Yes   Pain Score 6    Pain Location Back   Pain Orientation Left;Posterior;Lower   Pain Descriptors / Indicators Sharp  stabbing   Pain Type Chronic pain   Pain Radiating Towards Shoot from back to lateral leg (constant   Pain Onset More than a month ago   Pain Frequency Constant   Aggravating Factors  home  tasks, activity on feet   Pain Relieving Factors injection from MD, Occasionally meds   Multiple Pain Sites No            [facility] PT Assessment - 04/03/16 0001      Assessment   Medical Diagnosis chronic LT sided LBP   Referring Provider Junie Panning DO   Onset Date/Surgical Date --  since 2004   Next MD Visit As needed   Prior Therapy She has been to PT 4-5 times.      Precautions   Precautions None     Restrictions   Weight Bearing Restrictions No     Balance Screen   Has the patient fallen in the past 6 months No   Has the patient had a decrease in activity level because of a fear of falling?  No   Is the patient reluctant to leave their home because of a fear of falling?  No     Prior Function   Level of Independence Needs assistance with homemaking  occasionally   Vocation On disability     Cognition   Overall Cognitive Status Within Functional Limits for tasks assessed  Posture/Postural Control   Posture Comments flexed trunk , rounded shoulders      ROM / Strength   AROM / PROM / Strength AROM;Strength;PROM     AROM   AROM Assessment Site Lumbar   Lumbar Flexion 55  more pain than extension   Lumbar Extension 10   Lumbar - Right Side Bend 15   Lumbar - Left Side Bend 10     PROM   Overall PROM Comments passive motion of hips and knee WNL bilaterally.      Strength   Overall Strength Comments Weakness with Lt quad and hamstring 4+/5  bilateral hip flexion 3/5,   abduction  3/5, extension 3/5   , rotation 4/5   ,    poor abdomnals      Flexibility   Soft Tissue Assessment /Muscle Length yes   Hamstrings 70 degrees asssited RT andLT both with pain on lifted leg rom knee to back posterior                           PT Education - 04/03/16 0938    Education provided Yes   Education Details POC , results of eval   Person(s) Educated Patient   Methods Explanation   Comprehension Verbalized understanding          PT Short  Term Goals - 04/03/16 0946      PT SHORT TERM GOAL #1   Title She will be independent with inital HEP    Time 3   Period Weeks   Status New     PT SHORT TERM GOAL #2   Title She will report 25% improvement in pain   Time 3   Period Weeks   Status New           PT Long Term Goals - 04/03/16 0948      PT LONG TERM GOAL #1   Title She will be independent  with all HEP issued   Time 6   Period Weeks   Status New     PT LONG TERM GOAL #2   Title She will report 40% decreased in back pain   Time 6   Period Weeks   Status New     PT LONG TERM GOAL #3   Title She will improve hip strength  4/5 to improve activity on feet.   Time 6   Period Weeks   Status New     PT LONG TERM GOAL #4   Title BERG score improved 8 points to demo improved balance   Time 6   Period Weeks   Status New     PT LONG TERM GOAL #5   Title FOTO score improved to 60% limited or more   Time 6   Period Weeks   Status New               Plan - 04/03/16 0939    Clinical Impression Statement Ms Capps presents for moderate complexity eval  for chronic LBP , spasms, weakness , abnormal posture.  She has been to PT 4-5 times in past with min benefit. She has TENS at thome with benefit.  She does not use heat at home . She is very weak in LE and core and though she may not have a significant decr pain she will need strenth to continue independent mobility in future.    Rehab Potential Good   Clinical Impairments Affecting Rehab Potential significant  weakness and chronicity of pain   PT Frequency 2x / week   PT Duration 6 weeks   PT Treatment/Interventions Iontophoresis 4mg /ml Dexamethasone;Moist Heat;Therapeutic exercise;Patient/family education;Manual techniques;Taping   PT Next Visit Plan BERG test, LE strength heat   Consulted and Agree with Plan of Care Patient      Patient will benefit from skilled therapeutic intervention in order to improve the following deficits and impairments:   Decreased range of motion, Difficulty walking, Pain, Decreased activity tolerance, Postural dysfunction, Decreased strength, Increased muscle spasms  Visit Diagnosis: Chronic left-sided low back pain, with sciatica presence unspecified - Plan: PT plan of care cert/re-cert  Abnormal posture - Plan: PT plan of care cert/re-cert  Muscle spasm of back - Plan: PT plan of care cert/re-cert  Muscle weakness (generalized) - Plan: PT plan of care cert/re-cert  Difficulty in walking, not elsewhere classified - Plan: PT plan of care cert/re-cert      G-Codes - 97/53/00 5110    Functional Assessment Tool Used (Outpatient Only) FOTO  71% limited   Functional Limitation Mobility: Walking and moving around   Mobility: Walking and Moving Around Current Status [phone]) At least 60 percent but less than 80 percent impaired, limited or restricted   Mobility: Walking and Moving Around Goal Status [phone]) At least 40 percent but less than 60 percent impaired, limited or restricted       Problem List Patient Active Problem List   Diagnosis Date Noted  . Piriformis syndrome of left side 07/10/2015  . Psoriasis 07/10/2015  . Leg cramps 10/17/2014  . Health care maintenance 10/17/2014  . Knee pain, bilateral 10/08/2013  . Asthma with COPD ([place]) 05/25/2013  . Allergic rhinitis 10/12/2012  . Hepatitis C 03/06/2011  . 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 Anxiety state 07/30/2007  . Obesity, Class II, BMI 35-39.9 03/07/2006  . Depression 03/07/2006  . HYPERTENSION, BENIGN SYSTEMIC 03/07/2006  . Chronic low back pain 03/07/2006  . Insomnia 03/07/2006    [name]  PT 04/03/2016, 9:55 AM  [facility] [address] [place], [place], [address] Phone: [phone]   Fax:  [phone]  Name: HERMILA MILLIS MRN: [id number] Date of Birth: 06/26/1955

## 2016-04-05 ENCOUNTER — Ambulatory Visit (INDEPENDENT_AMBULATORY_CARE_PROVIDER_SITE_OTHER): Payer: Medicare HMO | Admitting: Family Medicine

## 2016-04-05 ENCOUNTER — Encounter: Payer: Self-pay | Admitting: Family Medicine

## 2016-04-05 ENCOUNTER — Ambulatory Visit (INDEPENDENT_AMBULATORY_CARE_PROVIDER_SITE_OTHER): Payer: Medicare HMO | Admitting: Psychology

## 2016-04-05 VITALS — BP 136/82 | HR 88 | Temp 98.1°F | Ht 65.0 in | Wt 245.8 lb

## 2016-04-05 DIAGNOSIS — G9059 Complex regional pain syndrome I of other specified site: Secondary | ICD-10-CM

## 2016-04-05 DIAGNOSIS — E669 Obesity, unspecified: Secondary | ICD-10-CM

## 2016-04-05 DIAGNOSIS — F331 Major depressive disorder, recurrent, moderate: Secondary | ICD-10-CM

## 2016-04-05 NOTE — Patient Instructions (Signed)
Please schedule a follow-up for:  April 13th at 8:30.  I think getting a scale is a great idea.  Consider what other things you think might be helpful in losing weight.  A weight loss class might be an interesting thing to consider.  You can look them up on line.

## 2016-04-05 NOTE — Assessment & Plan Note (Signed)
She reports motivation to lose weight and seems interested in discussing strategies.  Discussed the idea that there are a bunch of different ways to go about this.  She agreed to consider what she might be interested in.  See patient instructions for further plan.

## 2016-04-05 NOTE — Patient Instructions (Signed)
Thank you so much for coming to visit today! I have discussed options for your pain with the pharmacist and unfortunately there are not many medications that can be used for this problem. He recommended taking Ibuprofen and Tylenol scheduled together for the best effect. He also recommended continuing to use your lidocaine cream and following up with physical therapy. [facility] Clinic is another option for you. Please return in 2 weeks after you have seen Physical Therapy a few more times.  Dr. Gerlean Ren

## 2016-04-05 NOTE — Assessment & Plan Note (Signed)
Mood appears fairly stable.  PHQ-9 of 8 today.  Affect looks mildly restricted.  She reports she has pain.  Discussed communicating more directly with Stacy Campbell about the reasons [name] is not interested in visiting or writing to her right now.  Making positive changes in her health might help her mood as well.  She states she interested I weight loss.

## 2016-04-05 NOTE — Progress Notes (Signed)
Reason for follow-up:  Patient presents for follow-up for mood issues and management of chronic stressors.   Issues discussed:  Stacy Campbell's phone calls home reportedly send Stacy Campbell back to a bad place.  She has been clear about no plans to go visit her or write her.    She took advantage of some opportunities for socialization but is feeling undecided about Easter.  She has to choose a child's house if she is to go anywhere and that can be tricky.  She will be watching five grandchildren over spring break and this is tiring.    Reports she continues to do well with junk food.  There is some Easter candy in the house that she has avoided.  Is not eating regular meals.  Would like to lose weight.  Thinks getting a scale might help.  Stacy Campbell is looking into that for her.

## 2016-04-08 DIAGNOSIS — G9050 Complex regional pain syndrome I, unspecified: Secondary | ICD-10-CM | POA: Insufficient documentation

## 2016-04-08 NOTE — Progress Notes (Signed)
Subjective:     Patient ID: Stacy Campbell, female   DOB: 1955/02/15, 61 y.o.   MRN: [id number]  HPI Stacy Campbell is a 61yo female presenting today for follow up of back pain. Recently went for first physical therapy session and notes it was very painful. Continues to have left sided back pain, mildly improved from last office visit. Suspect Stacy Campbell's pain has transitioned from chronic back pain with intermittent flares to Complex Regional Pain Syndrome. Denies numbness, saddle anesthesia. Denies changes in incontinence symptoms. Neurosurgery reports there is nothing they can do.  Discussed possible treatment options with Pharmacy. Patient unable to tolerate Lyrica or Gabapentin in the past due to severe nausea and vomiting. Drug interactions with Nortriptyline and Amitriptyline with current medication list. Already using Lidocaine cream with some improvement in pain. Pharmacy recommends Tylenol and Ibuprofen given limited treatment options.  Nonsmoker.  Review of Systems Per HPI    Objective:   Physical Exam  Constitutional: She appears well-developed and well-nourished. No distress.  Cardiovascular: Normal rate and regular rhythm.   No murmur heard. Pulmonary/Chest: Effort normal. No respiratory distress. She has no wheezes.  Musculoskeletal:  Hyperesthesia of left lower back noted, consistent with prior exam      Assessment and Plan:     1. Complex regional pain syndrome type 1 affecting other site Left lower back. Unfortunately limited treatment options given prior intolerance to Lyrica and Gabapentin and drug interactions with Nortriptyline and Amitriptyline. Encouraged to continue following with physical therapy. OTC treatment and continue Lidocaine cream. If no improvement, may need referral to Pain Management. Follow up in 2 weeks. Will also need visit to discuss suspected stress/urge incontinence (unrelated to back pain, h/o unchanged).

## 2016-04-09 ENCOUNTER — Telehealth: Payer: Self-pay | Admitting: Psychology

## 2016-04-09 NOTE — Telephone Encounter (Signed)
Discussed possibilities for weight loss classes / services with Dr. Jenne Campus.  She didn't know of any that are free/low-cost. She also stated that with Medicare and no dx of Diabetes, Dr. De Nurse individual services would not be covered.  She does have Dr. Gerlean Ren working with her soon in a nutrition clinic which is an option.  I called the patient and provided her this information.  She sounded very interested in meeting with Dr. Jenne Campus and Dr. Gerlean Ren in May for Nutrition Clinic.  I gave her the information to call.  Will let Drs. Jenne Campus and [place] know.

## 2016-04-10 ENCOUNTER — Ambulatory Visit: Payer: Medicare HMO | Admitting: Physical Therapy

## 2016-04-12 ENCOUNTER — Ambulatory Visit: Payer: Medicare HMO | Admitting: Physical Therapy

## 2016-04-12 DIAGNOSIS — L2089 Other atopic dermatitis: Secondary | ICD-10-CM | POA: Diagnosis not present

## 2016-04-12 DIAGNOSIS — L03311 Cellulitis of abdominal wall: Secondary | ICD-10-CM | POA: Diagnosis not present

## 2016-04-18 ENCOUNTER — Encounter: Payer: Medicare HMO | Admitting: Physical Therapy

## 2016-04-18 DIAGNOSIS — L2089 Other atopic dermatitis: Secondary | ICD-10-CM | POA: Diagnosis not present

## 2016-04-18 DIAGNOSIS — L03311 Cellulitis of abdominal wall: Secondary | ICD-10-CM | POA: Diagnosis not present

## 2016-04-20 ENCOUNTER — Ambulatory Visit: Payer: Medicare HMO | Admitting: Psychology

## 2016-04-23 ENCOUNTER — Ambulatory Visit (INDEPENDENT_AMBULATORY_CARE_PROVIDER_SITE_OTHER): Payer: Medicare HMO | Admitting: Family Medicine

## 2016-04-23 ENCOUNTER — Encounter: Payer: Self-pay | Admitting: Family Medicine

## 2016-04-23 VITALS — BP 125/82 | HR 82 | Temp 98.2°F | Ht 65.0 in | Wt 250.4 lb

## 2016-04-23 DIAGNOSIS — M545 Low back pain, unspecified: Secondary | ICD-10-CM

## 2016-04-23 DIAGNOSIS — G8929 Other chronic pain: Secondary | ICD-10-CM

## 2016-04-23 DIAGNOSIS — G9059 Complex regional pain syndrome I of other specified site: Secondary | ICD-10-CM | POA: Diagnosis not present

## 2016-04-23 MED ORDER — METHYLPREDNISOLONE ACETATE 40 MG/ML IJ SUSP
40.0000 mg | Freq: Once | INTRAMUSCULAR | Status: AC
  Administered 2016-04-23: 40 mg via INTRAMUSCULAR

## 2016-04-23 MED ORDER — HYDROCODONE-ACETAMINOPHEN 5-325 MG PO TABS: 1.0000 | ORAL_TABLET | Freq: Four times a day (QID) | ORAL | 0 refills | Status: DC | PRN

## 2016-04-23 MED ORDER — KETOROLAC TROMETHAMINE 30 MG/ML IJ SOLN
30.0000 mg | Freq: Once | INTRAMUSCULAR | Status: AC
  Administered 2016-04-23: 30 mg via INTRAMUSCULAR

## 2016-04-23 NOTE — Progress Notes (Signed)
Subjective:     Patient ID: Stacy Campbell, female   DOB: 12-13-1955, 61 y.o.   MRN: [id number]  HPI Stacy Campbell is a 61yo female returning today for acute on chronic back pain. Pain has been present and worsening since last office visit on 3/29. Has been unable to follow up with physical therapy, but does have her next visit tomorrow with them. Has been using Tylenol and Ibuprofen without relief. Has not tolerated Gabapentin or Lyrica in the past and other medications for her chronic back pain with component of Complex Regional Pain Syndrome are contraindicated with her current psych medications. Denies radiation to legs, stating her pain is in her left lower back. Denies numbness or weakness. Urinary incontinence unchanged and denies fecal incontinence. Previously evaluated by neurosurgery, but was told there isn't anything they can do.  Agreeable to referral to Pain Management. Nonsmoker.   Review of Systems Per HPI    Objective:   Physical Exam  Constitutional: She appears well-developed and well-nourished. No distress.  Cardiovascular: Normal rate and regular rhythm.   No murmur heard. Pulmonary/Chest: Effort normal. No respiratory distress. She has no wheezes.  Musculoskeletal:  Tenderness over lumbar spine and left paraspinal muscles. Positive straight leg raise on left. Hyperesthesia improved from last office visit.  Neurological:  Muscle strength 5/5 in upper and lower extremities. Sensation intact.   Psychiatric: She has a normal mood and affect. Her behavior is normal.      Assessment and Plan:     1. Chronic left-sided low back pain without sciatica Consistent with prior pain flares. Seen by Neurosurgery and was told there was nothing they could do. Follows up with physical therapy tomorrow. Will give shot of Toradol and Depo today to help with pain. Norco refilled after review of [place] Narcotic Database without red flags. Referral to Pain Clinic.   2. Complex regional  pain syndrome type 1 affecting other site Ambulatory referral to Pain Clinic

## 2016-04-23 NOTE — Patient Instructions (Signed)
I'm sorry you are having worse back pain. We will give you a shot of Toradol and Steroids today. I will give you a refill of your pain medication. Please see what physical therapy can offer tomorrow. I have placed a referral to Pain Management, so you should hopefully hear from them soon!  Dr. Gerlean Ren

## 2016-04-24 ENCOUNTER — Ambulatory Visit: Payer: Medicare HMO | Attending: Family Medicine

## 2016-04-24 DIAGNOSIS — G8929 Other chronic pain: Secondary | ICD-10-CM | POA: Diagnosis not present

## 2016-04-24 DIAGNOSIS — M6281 Muscle weakness (generalized): Secondary | ICD-10-CM | POA: Diagnosis not present

## 2016-04-24 DIAGNOSIS — M545 Low back pain: Secondary | ICD-10-CM | POA: Diagnosis not present

## 2016-04-24 DIAGNOSIS — R293 Abnormal posture: Secondary | ICD-10-CM

## 2016-04-24 DIAGNOSIS — M62830 Muscle spasm of back: Secondary | ICD-10-CM | POA: Diagnosis not present

## 2016-04-24 DIAGNOSIS — R262 Difficulty in walking, not elsewhere classified: Secondary | ICD-10-CM | POA: Diagnosis not present

## 2016-04-24 NOTE — Therapy (Addendum)
[place], [place], [address] Phone: [phone]   Fax:  [phone]  Physical Therapy Treatment/Discharge  Patient Details  Name: Stacy Campbell MRN: [id number] Date of Birth: Oct 21, 1955 Referring Provider: Junie Panning DO  Encounter Date: 04/24/2016      PT End of Session - 04/24/16 0847    Visit Number 2   Number of Visits 12   Date for PT Re-Evaluation 05/18/16   Authorization Type [employer] M[employer]   PT Start Time 0845   PT Stop Time 0940   PT Time Calculation (min) 55 min   Activity Tolerance Patient tolerated treatment well   Behavior During Therapy [facility] for tasks assessed/performed      Past Medical History:  Diagnosis Date  . Anxiety   . Arthritis   . Asthma   . Bone spur    spine  . Depression   . Eczema   . Hypertension   . Insomnia     History reviewed. No pertinent surgical history.  There were no vitals filed for this visit.      Subjective Assessment - 04/24/16 0848    Subjective Everything about the same.    Currently in Pain? Yes   Pain Score 9    Pain Location Back   Pain Orientation Left;Posterior;Lower   Pain Descriptors / Indicators Sharp   Pain Type Chronic pain   Pain Onset More than a month ago   Pain Frequency Constant   Aggravating Factors  activity   Pain Relieving Factors occasionally meds   Multiple Pain Sites No            [facility] PT Assessment - 04/24/16 0001      Standardized Balance Assessment   Standardized Balance Assessment Berg Balance Test     Berg Balance Test   Sit to Stand Able to stand without using hands and stabilize independently   Standing Unsupported Able to stand safely 2 minutes   Sitting with Back Unsupported but Feet Supported on Floor or Stool Able to sit safely and securely 2 minutes   Stand to Sit Sits safely with minimal use of hands   Transfers Able to transfer safely, minor use of hands   Standing Unsupported  with Eyes Closed Able to stand 10 seconds safely   Standing Ubsupported with Feet Together Able to place feet together independently but unable to hold for 30 seconds   From Standing, Reach Forward with Outstretched Arm Can reach forward >12 cm safely (5")   From Standing Position, Pick up Object from Floor Able to pick up shoe, needs supervision   From Standing Position, Turn to Look Behind Over each Shoulder Turn sideways only but maintains balance   Turn 360 Degrees Able to turn 360 degrees safely but slowly   Standing Unsupported, Alternately Place Feet on Step/Stool Able to complete 4 steps without aid or supervision   Standing Unsupported, One Foot in Front Able to take small step independently and hold 30 seconds   Standing on One Leg Able to lift leg independently and hold 5-10 seconds   Total Score 43                     [facility] Adult PT Treatment/Exercise - 04/24/16 0001      Exercises   Exercises Lumbar     Lumbar Exercises: Stretches   Lower Trunk Rotation Limitations 10 reps 6-8 inches  range   Pelvic Tilt Limitations 10 reps , 3  sec hold.      Lumbar Exercises: Supine   Glut Set Limitations 10 reps with ball squeeze and clam with red band   Bridge 10 reps;2 seconds     Modalities   Modalities Moist Heat     Moist Heat Therapy   Number Minutes Moist Heat 12 Minutes   Moist Heat Location Lumbar Spine     Manual Therapy   Manual Therapy Soft tissue mobilization   Soft tissue mobilization With tool STW toLT lower lumbar spine.                 PT Education - 04/24/16 0900    Education provided Yes   Education Details discussed BERG score and benefit of using cane for decreaseing risk of fall   Person(s) Educated Patient   Methods Explanation   Comprehension Verbalized understanding          PT Short Term Goals - 04/03/16 0946      PT SHORT TERM GOAL #1   Title She will be independent with inital HEP    Time 3   Period Weeks   Status  New     PT SHORT TERM GOAL #2   Title She will report 25% improvement in pain   Time 3   Period Weeks   Status New           PT Long Term Goals - 04/03/16 3335      PT LONG TERM GOAL #1   Title She will be independent  with all HEP issued   Time 6   Period Weeks   Status New     PT LONG TERM GOAL #2   Title She will report 40% decreased in back pain   Time 6   Period Weeks   Status New     PT LONG TERM GOAL #3   Title She will improve hip strength  4/5 to improve activity on feet.   Time 6   Period Weeks   Status New     PT LONG TERM GOAL #4   Title BERG score improved 8 points to demo improved balance   Time 6   Period Weeks   Status New     PT LONG TERM GOAL #5   Title FOTO score improved to 60% limited or more   Time 6   Period Weeks   Status New               Plan - 04/24/16 4562    PT Treatment/Interventions Iontophoresis 42m/ml Dexamethasone;Moist Heat;Therapeutic exercise;Patient/family education;Manual techniques;Taping   PT Next Visit Plan BERG test, LE strength heat      Patient will benefit from skilled therapeutic intervention in order to improve the following deficits and impairments:  Decreased range of motion, Difficulty walking, Pain, Decreased activity tolerance, Postural dysfunction, Decreased strength, Increased muscle spasms  Visit Diagnosis: Chronic left-sided low back pain, with sciatica presence unspecified  Abnormal posture  Muscle spasm of back  Muscle weakness (generalized)  Difficulty in walking, not elsewhere classified     Problem List Patient Active Problem List   Diagnosis Date Noted  . Complex regional pain syndrome type I 04/08/2016  . Piriformis syndrome of left side 07/10/2015  . Psoriasis 07/10/2015  . Leg cramps 10/17/2014  . Health care maintenance 10/17/2014  . Knee pain, bilateral 10/08/2013  . Asthma with COPD ([place] 05/25/2013  . Allergic rhinitis 10/12/2012  . Hepatitis C 03/06/2011  .  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  Anxiety state 07/30/2007  . Obesity, Class II, BMI 35-39.9 03/07/2006  . Depression 03/07/2006  . HYPERTENSION, BENIGN SYSTEMIC 03/07/2006  . Chronic low back pain 03/07/2006  . Insomnia 03/07/2006    [name]  PT 04/24/2016, 9:27 AM  [facility] [address] [place], [place], [address] Phone: [phone]   Fax:  [phone]  Name: LIND AUSLEY MRN: [id number] Date of Birth: Mar 13, 1955  PHYSICAL THERAPY DISCHARGE SUMMARY  Visits from Start of Care: 2  Current functional level related to goals / functional outcomes: Unknown as she did not return after this visit   Remaining deficits: Unknown   Education / Equipment: HEP Plan:                                                    Patient goals were not met. Patient is being discharged due to not returning since the last visit.  ?????   Noralee Stain, PT    06/12/16    1:06PM

## 2016-04-25 DIAGNOSIS — L299 Pruritus, unspecified: Secondary | ICD-10-CM | POA: Diagnosis not present

## 2016-04-25 DIAGNOSIS — Z8679 Personal history of other diseases of the circulatory system: Secondary | ICD-10-CM | POA: Diagnosis not present

## 2016-04-25 DIAGNOSIS — Z872 Personal history of diseases of the skin and subcutaneous tissue: Secondary | ICD-10-CM | POA: Diagnosis not present

## 2016-04-25 DIAGNOSIS — R234 Changes in skin texture: Secondary | ICD-10-CM | POA: Diagnosis not present

## 2016-04-25 DIAGNOSIS — E119 Type 2 diabetes mellitus without complications: Secondary | ICD-10-CM | POA: Diagnosis not present

## 2016-04-25 DIAGNOSIS — L2089 Other atopic dermatitis: Secondary | ICD-10-CM | POA: Diagnosis not present

## 2016-04-26 DIAGNOSIS — M1711 Unilateral primary osteoarthritis, right knee: Secondary | ICD-10-CM | POA: Diagnosis not present

## 2016-04-26 DIAGNOSIS — M1712 Unilateral primary osteoarthritis, left knee: Secondary | ICD-10-CM | POA: Diagnosis not present

## 2016-04-26 DIAGNOSIS — M47896 Other spondylosis, lumbar region: Secondary | ICD-10-CM | POA: Diagnosis not present

## 2016-05-01 ENCOUNTER — Ambulatory Visit: Payer: Medicare HMO

## 2016-05-02 ENCOUNTER — Ambulatory Visit: Payer: Medicare HMO | Admitting: Physical Therapy

## 2016-05-09 DIAGNOSIS — R234 Changes in skin texture: Secondary | ICD-10-CM | POA: Diagnosis not present

## 2016-05-09 DIAGNOSIS — Z79899 Other long term (current) drug therapy: Secondary | ICD-10-CM | POA: Diagnosis not present

## 2016-05-09 DIAGNOSIS — L2089 Other atopic dermatitis: Secondary | ICD-10-CM | POA: Diagnosis not present

## 2016-05-10 ENCOUNTER — Ambulatory Visit (INDEPENDENT_AMBULATORY_CARE_PROVIDER_SITE_OTHER): Payer: Medicare HMO | Admitting: Family Medicine

## 2016-05-10 VITALS — Ht 65.0 in | Wt 246.6 lb

## 2016-05-10 DIAGNOSIS — L03113 Cellulitis of right upper limb: Secondary | ICD-10-CM | POA: Diagnosis not present

## 2016-05-10 DIAGNOSIS — F509 Eating disorder, unspecified: Secondary | ICD-10-CM | POA: Diagnosis not present

## 2016-05-10 MED ORDER — CEPHALEXIN 500 MG PO CAPS: 500.0000 mg | ORAL_CAPSULE | Freq: Two times a day (BID) | ORAL | 0 refills | Status: DC

## 2016-05-10 NOTE — Progress Notes (Signed)
[facility]  Usual eating pattern includes 2 meals and snacks all day long.  Frequent foods and beverages include sodas (Pepsi, Coke, Dr. Malachi Bonds), [place], [place].  Usual physical activity includes None.  24-hr recall: Up at 6am B (10 AM)- 3 pieces of bacon, 2 boiled eggs, water Snk (10:30AM)- Lays Chips (2 small bags), Pepsi 1 regular sized can, oatmeal cookie L-skipped Snk (1-8PM)- Vanilla Ice Cream (1/2 pint), Chocolate Chip Cookies (4), oatmeal cookies (2), Pretzels (1 small bag) D (8:15PM)- Ham and Cheese Sandwich, Dr. Malachi Bonds 1 regular sized can Snk (10PM)- Cupcake (chocolate, 2) Bed at 10:15pm Typical day? Yes.    Reports she is an emotional eater and has had a lot going on lately with her granddaughter. Reports she really wants to work on not eating junk food and snacking all day. Reports she is a poor sleeper, often sleeping from 10-3 and then again from 8-1 after dropping off her grandchildren at school. Watches tv prior to bed. Thinks a lot about her problems prior to bed, which keeps her up at night. Occasionally takes Ambien for sleep. Chronic back pain limits physical activity--reports she was recently told by both neurosurgery and physical therapy that there is little they can do. Awaiting appointment at Pain Management in a few weeks.  Handouts given during visit include: GOALS: 1. Eat less "junk food" such as cookies, ice cream, cupcakes. Instead try health snacks, such vegetables and fruit. 2. Sit down to eat three meals a day and limit snacks to 2 per day (one in the morning and one in the afternoon). 3. Drink more water. Limit soft drinks to 1 regular can per day. Handout on sleep given. Plans to review and find a few items in the handout she can try. May also use Melatonin as needed for sleep.  Copy of [id number] given to review. Discussed steps. Will not make this a clear goal for this visit, but would like her to review and attempt it a few times between now and  then.   Also of note, received an injection by her dermatologist yesterday 5/2. Since that time, her right arm has become red, warm, and swollen surrounding the injection site. This has happened previously with injections and required antibiotics. Physical exam shows erythema and warmth surrounding injection site on right upper arm. Prescription for Keflex printed with instructions to return for office visit if she notes worsening on antibiotics.  Follow up in my clinic on 6/13. Discussed that this visit will be for nutrition counseling and not her back pain. Will review how she has done on her goals that visit. Will have visited pain management, so may start addressing exercise as well. Will consider adding Urge911 to goals.

## 2016-05-10 NOTE — Patient Instructions (Addendum)
GOALS: 1. Eat less "junk food" such as cookies, ice cream, cupcakes. Instead try health snacks, such vegetables and fruit. 2. Sit down to eat three meals a day and limit snacks to 2 per day (one in the morning and one in the afternoon). 3. Drink more water. Limit soft drinks to 1 regular can per day.  Handout on Urge911 given. Please review before your next visit. Vitamin C may help with your pain. I recommend picking this up at your drug store. You may try Melatonin over the counter to help with your sleep.  I suspect you have cellulitis in your right arm. I have printed you a prescription for Keflex, which should be $4 at [facility]. If your arm continues to worsen despite antibiotics or if you develop fever, please return.  Return in 45month to Dr. Gerlean Ren in clinic to discuss nutrition.

## 2016-05-17 ENCOUNTER — Encounter: Payer: Self-pay | Admitting: Family Medicine

## 2016-05-17 ENCOUNTER — Ambulatory Visit (INDEPENDENT_AMBULATORY_CARE_PROVIDER_SITE_OTHER): Payer: Medicare HMO | Admitting: Family Medicine

## 2016-05-17 VITALS — BP 152/82 | HR 79 | Temp 98.3°F | Ht 65.0 in | Wt 248.0 lb

## 2016-05-17 DIAGNOSIS — Z6841 Body Mass Index (BMI) 40.0 and over, adult: Secondary | ICD-10-CM | POA: Diagnosis not present

## 2016-05-17 DIAGNOSIS — F5101 Primary insomnia: Secondary | ICD-10-CM

## 2016-05-17 DIAGNOSIS — F411 Generalized anxiety disorder: Secondary | ICD-10-CM | POA: Diagnosis not present

## 2016-05-17 MED ORDER — ALPRAZOLAM 1 MG PO TABS: ORAL_TABLET | ORAL | 1 refills | Status: DC

## 2016-05-17 NOTE — Patient Instructions (Addendum)
Goal: 1) Exercise at least twice per week. 2) Continue to work on your goals from [place] less "junk food" such as cookies, ice cream, cupcakes. Instead try health snacks, such vegetables and fruit. - Sit down to eat three meals a day and limit snacks to 2 per day (one in the morning and one in the afternoon). - Drink more water. Limit soft drinks to 1 regular can per day. Follow up as scheduled on 6/13

## 2016-05-18 NOTE — Progress Notes (Signed)
Subjective:     Patient ID: Stacy Campbell, female   DOB: May 15, 1955, 61 y.o.   MRN: [id number]  HPI Stacy Campbell is a 61yo female presenting today for [facility].  What do you want medical workers who work with you in the future to know about you?--To know that my daughter has passed and I am caring for her children. My granddaughter has a lot of behavioral problems and is currently in a facility in [place], [place]. Would like for them to be understanding and listen to her.  What are your strength or assets?--Taking care of grandchildren.   What are your worries or concerns?--Grandchildren and other Family, My health, Financial.  What are your goals for life and medical care?  Immediate- Weight Loss, Being able to do more exercise and chores at home.  Short Term- Weight Loss, Increased Energy, Improve Health  Long Term- Increased Energy, Become More Involved with Grandchildren (seldom goes to school functions due to back spasms), Spend More Time With Family  PCP Goals: Prior to visit, my goal was to improve Stacy Campbell's back pain since that has been the focus of most of our visits. This has shifted given Stacy Campbell's focus on ultimately increasing her energy and losing weight so she can be more active in her grandchildren's lives. Current goal is to help Stacy Campbell become more functional.  Current Limitation to Obtaining Goal: Unsure. States she feels contrary--when she wants to go out she is in pain and can't do so, but when she is feeling well she doesn't want to go out. Family is constantly asking her to come with them on different activities (gym, walking, social activities), but she usually tells them no.   Currently not exercising. Working on Clinical biochemist by [employer] (see note from 05/10/16)--has been going well with no soft drinks since that visit.  Uses Ambien 1-4 times per month. About to try Melatonin in attempt to wean off of medication. Chronic back  pain is often correlated with sleep.  Requests refill of Xanax, currently used for panic attacks. Follows with [facility].  Review of Systems Per HPI    Objective:   Physical Exam  Constitutional: She appears well-developed and well-nourished. No distress.  Pulmonary/Chest: No respiratory distress.  Neurological: She is alert.  Psychiatric: She has a normal mood and affect. Her behavior is normal.      Assessment and Plan:     1. Class 3 severe obesity due to excess calories without serious comorbidity with body mass index (BMI) of 40.0 to 44.9 in adult [facility]) - Working on this and increasing energy appears to be Stacy Campbell's primary goal. Continue to work on goals set in [facility]. New goal of exercising at least twice per week (current baseline 0 times per week). To follow up as scheduled. Consider referral to Bariatrics.  2. Generalized anxiety disorder - Xanax refilled  3. Primary insomnia - Encouraged transition to Melatonin. Consider discontinuing Ambien at future visit if Melatonin works well. Continue to work on sleep hygiene.

## 2016-05-31 ENCOUNTER — Ambulatory Visit (INDEPENDENT_AMBULATORY_CARE_PROVIDER_SITE_OTHER): Payer: Medicare HMO | Admitting: Family Medicine

## 2016-05-31 ENCOUNTER — Encounter: Payer: Self-pay | Admitting: Family Medicine

## 2016-05-31 VITALS — BP 152/84 | HR 93 | Temp 98.1°F | Ht 65.0 in | Wt 246.6 lb

## 2016-05-31 DIAGNOSIS — M5442 Lumbago with sciatica, left side: Secondary | ICD-10-CM | POA: Diagnosis not present

## 2016-05-31 DIAGNOSIS — G8929 Other chronic pain: Secondary | ICD-10-CM | POA: Diagnosis not present

## 2016-05-31 DIAGNOSIS — M5441 Lumbago with sciatica, right side: Secondary | ICD-10-CM | POA: Diagnosis not present

## 2016-05-31 MED ORDER — METHYLPREDNISOLONE ACETATE 40 MG/ML IJ SUSP
40.0000 mg | Freq: Once | INTRAMUSCULAR | Status: AC
  Administered 2016-05-31: 40 mg via INTRAMUSCULAR

## 2016-05-31 MED ORDER — KETOROLAC TROMETHAMINE 30 MG/ML IJ SOLN
30.0000 mg | Freq: Once | INTRAMUSCULAR | Status: AC
  Administered 2016-05-31: 30 mg via INTRAMUSCULAR

## 2016-05-31 MED ORDER — HYDROCODONE-ACETAMINOPHEN 5-325 MG PO TABS: 1.0000 | ORAL_TABLET | Freq: Four times a day (QID) | ORAL | 0 refills | Status: DC | PRN

## 2016-05-31 NOTE — Patient Instructions (Addendum)
Thank you so much for coming to visit today! You are having a flare of your chronic back pain. We will give a shot of Toradol and Depo today for your pain. I have given you a prescription for Vicodin. Please follow up with Pain Management as scheduled. Keep up the good work on your goals! You are doing a great job!  Dr. Gerlean Ren

## 2016-05-31 NOTE — Progress Notes (Signed)
Subjective:     Patient ID: Stacy Campbell, female   DOB: 1955/02/10, 61 y.o.   MRN: [id number]  HPI Stacy Campbell is a 61yo female presenting for flare of chronic back pain.  Long history of left sided back pain with radiation of pain down left leg. Hyperesthesia noted several visits ago has resolved and pain present is more typical of her chronic back pain flares. Current flare started last Wednesday and has been worsening. Located over her left lower back and down left leg. Limits her ability to carry out ADLs. Establishes with Pain Management clinic on June 13 and her goal is to make it to that visit. Is continuing to work on her nutrition goals and has exercised 3 times over the last week. Nonsmoker.  Review of Systems Per HPI    Objective:   Physical Exam  Constitutional: She appears well-developed and well-nourished. No distress.  HENT:  Head: Normocephalic and atraumatic.  Cardiovascular: Normal rate and regular rhythm.   No murmur heard. Pulmonary/Chest: Effort normal. No respiratory distress. She has no wheezes.  Abdominal: Soft. She exhibits no distension. There is no tenderness.  Musculoskeletal:  Tenderness over left paraspinal muscles. Negative straight leg raise.  Psychiatric: She has a normal mood and affect. Her behavior is normal.      Assessment and Plan:     1. Chronic bilateral low back pain with bilateral sciatica Consistent with prior flares without hyperesthesia component noted previously. Injection of Depo 40mg  and Toradol30mg  in office. [place] Narcotic Database reviewed without red flags. Prescription for Vicodin given. Follow up with Pain Management on June 13 as scheduled.

## 2016-06-18 ENCOUNTER — Other Ambulatory Visit: Payer: Self-pay | Admitting: Family Medicine

## 2016-06-20 ENCOUNTER — Ambulatory Visit: Payer: Medicare HMO | Admitting: Family Medicine

## 2016-06-20 DIAGNOSIS — M25552 Pain in left hip: Secondary | ICD-10-CM | POA: Diagnosis not present

## 2016-06-20 DIAGNOSIS — G894 Chronic pain syndrome: Secondary | ICD-10-CM | POA: Diagnosis not present

## 2016-06-20 DIAGNOSIS — Z79891 Long term (current) use of opiate analgesic: Secondary | ICD-10-CM | POA: Diagnosis not present

## 2016-06-20 DIAGNOSIS — M545 Low back pain: Secondary | ICD-10-CM | POA: Diagnosis not present

## 2016-06-20 DIAGNOSIS — M25551 Pain in right hip: Secondary | ICD-10-CM | POA: Diagnosis not present

## 2016-06-28 ENCOUNTER — Ambulatory Visit (INDEPENDENT_AMBULATORY_CARE_PROVIDER_SITE_OTHER): Payer: Medicare HMO | Admitting: Family Medicine

## 2016-06-28 ENCOUNTER — Encounter: Payer: Self-pay | Admitting: Family Medicine

## 2016-06-28 VITALS — BP 123/80 | HR 75 | Temp 98.2°F | Ht 65.0 in | Wt 247.4 lb

## 2016-06-28 DIAGNOSIS — F509 Eating disorder, unspecified: Secondary | ICD-10-CM | POA: Diagnosis not present

## 2016-06-28 MED ORDER — ALPRAZOLAM 1 MG PO TABS
ORAL_TABLET | ORAL | 1 refills | Status: DC
Start: 2016-06-28 — End: 2016-08-23

## 2016-06-28 MED ORDER — ARIPIPRAZOLE 5 MG PO TABS: 5.0000 mg | ORAL_TABLET | Freq: Every day | ORAL | 10 refills | Status: DC

## 2016-06-28 MED ORDER — SERTRALINE HCL 100 MG PO TABS: 100.0000 mg | ORAL_TABLET | Freq: Every day | ORAL | 10 refills | Status: DC

## 2016-06-28 NOTE — Patient Instructions (Addendum)
Goals: 1. Exercise twice a week. 2. Keep a journal. Document at least once a day what you are feeling, what you want, and what you need. Bring your journal to your next visit. 3. Eat three meals per day with two snacks. A meal should consist of 1/2 plate of vegetables or fruits, 1/4 plate of carbs, and 1/4 plate of protein.  Please schedule a visit with Dr. Gwenlyn Saran. Follow up with Dr. Jenne Campus in [facility] in 1 month.  Dr. Gerlean Ren

## 2016-07-01 NOTE — Progress Notes (Signed)
Learning Readiness: Ready  Assessment  Usual eating pattern includes 2 meals and 1 snacks per day.  Usual physical activity includes walking, rarely  24-hr recall:  Up at 4 AM  B (11AM)- Boiled Egg, 1 piece of Toast (plain), Water  L (3 PM)- Sandwich with Ham and Cheese, Water  Snk (9 PM)- Peanut Butter Crackers  Bed at 9:15pm  Typical day? Yes  Subjective: Reports she has been doing well with decreasing amount of junk food eaten and soft drinks, however she continues to eat only 2 meals per day. Has also not been exercising. Reports barrier is poor motivation.  Previous Goals: 1. Eat less "junk food" such as cookies, ice cream, cupcakes. Instead try health snacks, such vegetables and fruit. 2. Sit down to eat three meals a day and limit snacks to 2 per day (one in the morning and one in the afternoon). 3. Drink more water. Limit soft drinks to 1 regular can per day. 4. Exercise twice per week (Set after last Nutrition visit)  Goals: 1. Exercise twice per week 2. Keep a journal. Document at least once a day what you are feeling, what you want, and what you need. Bring your journal to your next visit. 3. Eat three meals per day with two snacks. A meal should consist of 1/2 plate of vegetables or fruits, 1/4 plate of carbs, and 1/4 plate of protein.  Demonstrated degree of understanding via: Teach Back  Follow up with Nutrition Clinic in 1 month. Also plans to see Dr. Gwenlyn Saran over next month.

## 2016-07-05 ENCOUNTER — Other Ambulatory Visit: Payer: Self-pay | Admitting: Gastroenterology

## 2016-07-05 ENCOUNTER — Other Ambulatory Visit: Payer: Self-pay | Admitting: Family Medicine

## 2016-07-05 DIAGNOSIS — K7460 Unspecified cirrhosis of liver: Secondary | ICD-10-CM

## 2016-07-19 ENCOUNTER — Ambulatory Visit
Admission: RE | Admit: 2016-07-19 | Discharge: 2016-07-19 | Disposition: A | Discharge status: Home / Self Care | Payer: Medicare HMO | Source: Ambulatory Visit | Attending: Gastroenterology | Admitting: Gastroenterology

## 2016-07-19 DIAGNOSIS — K7460 Unspecified cirrhosis of liver: Secondary | ICD-10-CM | POA: Diagnosis not present

## 2016-07-20 DIAGNOSIS — G894 Chronic pain syndrome: Secondary | ICD-10-CM | POA: Diagnosis not present

## 2016-07-20 DIAGNOSIS — M545 Low back pain: Secondary | ICD-10-CM | POA: Diagnosis not present

## 2016-07-20 DIAGNOSIS — M25559 Pain in unspecified hip: Secondary | ICD-10-CM | POA: Diagnosis not present

## 2016-07-24 ENCOUNTER — Ambulatory Visit (INDEPENDENT_AMBULATORY_CARE_PROVIDER_SITE_OTHER): Payer: Medicare HMO | Admitting: Psychology

## 2016-07-24 DIAGNOSIS — F331 Major depressive disorder, recurrent, moderate: Secondary | ICD-10-CM

## 2016-07-24 NOTE — Patient Instructions (Signed)
Good to see you today.  I'll see you in one week.  See if you can identify one thing you would like to work on to get healthier or feeling better.

## 2016-07-24 NOTE — Progress Notes (Signed)
Reason for follow-up:  Patient last seen 04/05/16.  She has chronic stress in her life secondary to caring for her two grandchildren.  She also struggles with chronic pain and being overweight.  Issues discussed:  Stacy Campbell is still away.  She calls nearly every other day asking for Stacy Campbell to come visit and accusing her of various things.  While Stacy Campbell is relieved not to have her in the house, it continues to be a stress.  Attended nutrition appointments with PCP.  Has stalled in her weight.  Reviewed goals she had previously set for herself.  Not following any right now with the possible exception of some journaling (although this doesn'sound specific to food).

## 2016-07-25 NOTE — Assessment & Plan Note (Signed)
Report of mood is mildly depressed.  Affect is consistent.  She has been wanting to keep to herself more lately.  No evidence of SI / HI.  The chronic stress of [name] weighs on her.  Even when she is not around, the dynamic is taxing.  She wanted to come back in next week to discuss further.  Scheduled for then.  She seems to benefit from the social support and has trouble identifying or following through with any goals.  Still - will attempt to identify something next visit.

## 2016-07-31 ENCOUNTER — Ambulatory Visit (INDEPENDENT_AMBULATORY_CARE_PROVIDER_SITE_OTHER): Payer: Medicare HMO | Admitting: Psychology

## 2016-07-31 DIAGNOSIS — F331 Major depressive disorder, recurrent, moderate: Secondary | ICD-10-CM

## 2016-07-31 NOTE — Progress Notes (Signed)
Reason for follow-up:  Continued mood management in the face of chronic and acute stressors.  Issues discussed:  Goals.  She would like to work on her health and her weight.  She has tried different things and her weight has gone up or remained unchanged.    Her other goal is getting out of the house more.  She went to social functions both weekend days and did not enjoy them.  She does tend to enjoy going out with her friend Janace Hoard and her daughter Joycelyn Schmid.  Her kids push her to do things and she often doesn't want to but has trouble saying no.  Some of the interactions are challenging for her.

## 2016-07-31 NOTE — Patient Instructions (Signed)
Please schedule a follow up:  August 7th at 8:30.    [facility] of [facility] my body.  Will look into water-based activities.  You can ask the staff for suggestions as well.  This serves two purposes - improving your health and getting out of the house - both are goals you identified.

## 2016-07-31 NOTE — Assessment & Plan Note (Signed)
PHQ-9 is a 9.  She reported anhedonia nearly every day and feeling down and feeling bad about herself more than half the days.  Those were her highest numbers.    Weight loss is hard and she has set goals around a lot of things with the exception of exercise per her report.  Discussed the two-fold function of helping her get a bit healthier and getting her out of the house.  Also worked on divorcing exercise from weight loss and looking at it solely as an opportunity to care for her mind and body in a positive manner.  Pain in her back and knees is a barrier.  Discussed water.  She can't swim but has considered the pool for walking or aerobics in the past.  Has a silver sneaker card.  Has a way to get there.    She thought [facility] for an [facility]. We paired that down to 15 minutes with the idea that if she was having a ball, she could certainly keep going but that after 15 minutes, it was okay to stop.  She identified two days a week as reasonable.  She watches grandchildren during the week.    See patient instructions for further plan.

## 2016-08-08 ENCOUNTER — Telehealth: Payer: Self-pay | Admitting: Family Medicine

## 2016-08-08 NOTE — Telephone Encounter (Signed)
ptcalling to request refill of:  Name of Medication(s):  ambein   Last date of OV Pharmacy:  [facility] Will route refill request to [employer].  Discussed with patient policy to call pharmacy for future refills.  Also, discussed refills may take up to 48 hours to approve or deny.  Roseanna Rainbow

## 2016-08-10 ENCOUNTER — Other Ambulatory Visit: Payer: Self-pay | Admitting: Family Medicine

## 2016-08-10 ENCOUNTER — Telehealth: Payer: Self-pay | Admitting: Family Medicine

## 2016-08-10 DIAGNOSIS — Z79891 Long term (current) use of opiate analgesic: Secondary | ICD-10-CM | POA: Diagnosis not present

## 2016-08-10 DIAGNOSIS — G894 Chronic pain syndrome: Secondary | ICD-10-CM | POA: Diagnosis not present

## 2016-08-10 DIAGNOSIS — M25559 Pain in unspecified hip: Secondary | ICD-10-CM | POA: Diagnosis not present

## 2016-08-10 DIAGNOSIS — Z1211 Encounter for screening for malignant neoplasm of colon: Secondary | ICD-10-CM

## 2016-08-10 DIAGNOSIS — M545 Low back pain: Secondary | ICD-10-CM | POA: Diagnosis not present

## 2016-08-10 MED ORDER — ZOLPIDEM TARTRATE 10 MG PO TABS: 10.0000 mg | ORAL_TABLET | Freq: Every evening | ORAL | 5 refills | Status: DC | PRN

## 2016-08-10 NOTE — Telephone Encounter (Signed)
Pt would like a referral to get a colonoscopy done @ [facility], by Dr. Paulita Fujita. ep

## 2016-08-10 NOTE — Telephone Encounter (Signed)
Called in.   Thanks, Cherly Anderson. Rosalyn Gess, [place] Resident PGY-2 08/10/2016 5:18 PM

## 2016-08-14 ENCOUNTER — Ambulatory Visit (INDEPENDENT_AMBULATORY_CARE_PROVIDER_SITE_OTHER): Payer: Medicare HMO | Admitting: Psychology

## 2016-08-14 DIAGNOSIS — F331 Major depressive disorder, recurrent, moderate: Secondary | ICD-10-CM

## 2016-08-14 NOTE — Progress Notes (Signed)
Reason for follow-up:  [place] presents for continued management of mood issues and stress related to an ongoing problem with her granddaughter.    Issues discussed:  She set two goals for herself last visit:  [facility] and getting out of the house more.  She reports she didn't get [facility] because she went to the beach for a week with her family.  She had a good experience for the most part even though she did not, initially, want to go.    Pl[facility] to [facility] starting this coming Monday.  Has the rest of the week booked up with outings already.    31 birthday is today.  [name] will be moving to a group home in the not-so-distant future.  Amazing is confident that she does not want her back in her house.  It is stressful even thinking about the possibility.

## 2016-08-14 NOTE — Patient Instructions (Addendum)
Please schedule a follow-up for:  September 11, 2016 at 8:30.  Good luck [place] to [place] Y.  Remember it isn't about weight loss - it is about feeling better about your health and well-being.

## 2016-08-14 NOTE — Assessment & Plan Note (Signed)
Report of mood is sad today.  It is 43 birthday so this is on her mind.  She reports having a good week at the beach.  She enjoyed herself and felt peaceful.  Reinforced the idea of divorcing going to the Y from weight loss per se.  Reiterated the benefit the Y would have on her brain (mood) and body (feel better physically) regardless of change in weight.  I think getting there will be a challenge (decided to put off another week).  Will continue to monitor.    Will follow in one month or as needed in between.

## 2016-08-23 ENCOUNTER — Ambulatory Visit (INDEPENDENT_AMBULATORY_CARE_PROVIDER_SITE_OTHER): Payer: Medicare HMO | Admitting: Family Medicine

## 2016-08-23 ENCOUNTER — Encounter: Payer: Self-pay | Admitting: Family Medicine

## 2016-08-23 DIAGNOSIS — E669 Obesity, unspecified: Secondary | ICD-10-CM | POA: Diagnosis not present

## 2016-08-23 MED ORDER — ZOLPIDEM TARTRATE 10 MG PO TABS: 10.0000 mg | ORAL_TABLET | Freq: Every evening | ORAL | 5 refills | Status: DC | PRN

## 2016-08-23 MED ORDER — ALPRAZOLAM 1 MG PO TABS: ORAL_TABLET | ORAL | 3 refills | Status: DC

## 2016-08-23 NOTE — Progress Notes (Signed)
    Subjective:  Stacy Campbell is a 61 y.o. female who presents to the [facility] today to meet PCP  HPI:  Obesity:  Patient with history of morbid obesity complicated by chronic pain and major depression. Currently being followed at pain clinic and reports that she finds this to be somewhat helpful. [facility] were [facility], but she has not had time. Her grandson is starting school soon and she will have many hours to herself throughout the week. She reports that her brother likes to exercise and he drives.  We discussed whether it might be fun and encouraging for them to go to the Y together and exercise. She agreed.  Does have chronic knee pain as well as complex regional pain syndrome managed at pain clinic. She understands that that her obesity places a major part in the stress in her joints.   ROS: denies any numbness, tingling, CP, SOB, NVD, changes in bowel or bladder habits   Tobacco use reviewed Medication: reviewed and updated ROS: see HPI   Objective:  Physical Exam: BP 138/74   Pulse 60   Temp 98.3 F (36.8 C) (Oral)   Ht 5\' 5"  (1.651 m)   Wt 253 lb 6.4 oz (114.9 kg)   SpO2 96%   BMI 42.17 kg/m   Gen: 61yo F in NAD, resting comfortably CV: RRR with no murmurs appreciated Pulm: NWOB, CTAB with no crackles, wheezes, or rhonchi GI: Normal bowel sounds present. Soft, Nontender, Nondistended. MSK: no edema, cyanosis, or clubbing noted Skin: warm, dry Neuro: grossly normal, moves all extremities Psych: Normal affect and thought content  No results found for this or any previous visit (from the past 72 hour(s)).   Assessment/Plan:  Obesity, Class II, BMI 35-39.9 Plans to [facility] the Y once her grandson [name] starts school. Will discuss with her brother whether they might go together.  That would eliminate transportation issues and also serve as a good motivator for her.  Also discussed healthy diet options and the need for self-care.  -  follow up with Ander Purpura, RN for AWV in the near future   Health maintenance: UTD currently. Needs repeat lipid panel. Based on 2017 lipid panel and today's vitals, patient's ASCVD risk >7.6%.  Not currently on statin. Has historically been close to pre-diabetes (6.4 on 11/17 and 6.2 on 3/18).  If AWV is in September could consider repeat A1C.  - should be on mod-high intensity statin.  Repeat lipid panel - needs AWV

## 2016-08-23 NOTE — Patient Instructions (Addendum)
Stacy Campbell, it was very nice to meet you today.  I hope that you will be able to go to the Y with your brother after Robinette Haines starts school. I feel that this will both improve your mood and help with some of your aches and pains related to your weight.   Call the office with any questions.  Take care!  Please schedule a Well Woman Check with our nurse, Lauren in the upcoming weeks.  This is required yearly by medicare.   Daniel L. Rosalyn Gess, [place] Resident PGY-2 08/23/2016 9:38 AM

## 2016-08-25 NOTE — Assessment & Plan Note (Signed)
Plans to go to the Y once her grandson [name] starts school. Will discuss with her brother whether they might go together.  That would eliminate transportation issues and also serve as a good motivator for her.  Also discussed healthy diet options and the need for self-care.  - follow up with Ander Purpura, RN for AWV in the near future

## 2016-09-07 DIAGNOSIS — M545 Low back pain: Secondary | ICD-10-CM | POA: Diagnosis not present

## 2016-09-07 DIAGNOSIS — G894 Chronic pain syndrome: Secondary | ICD-10-CM | POA: Diagnosis not present

## 2016-09-07 DIAGNOSIS — M25559 Pain in unspecified hip: Secondary | ICD-10-CM | POA: Diagnosis not present

## 2016-09-07 DIAGNOSIS — Z79891 Long term (current) use of opiate analgesic: Secondary | ICD-10-CM | POA: Diagnosis not present

## 2016-09-11 ENCOUNTER — Ambulatory Visit (INDEPENDENT_AMBULATORY_CARE_PROVIDER_SITE_OTHER): Payer: Medicare HMO | Admitting: Psychology

## 2016-09-11 DIAGNOSIS — F331 Major depressive disorder, recurrent, moderate: Secondary | ICD-10-CM

## 2016-09-11 NOTE — Progress Notes (Signed)
Reason for follow-up:  Marisue presents for follow-up for continued mood management in the context of family stress.  Issues discussed:  She got a phone call from the center where [place] is.  They are switching to an all-boy facility and are trying to find [place] a group home placement but thus far, have been unsuccessful.  If they don't find one soon, [name] will be discharged home around 9/17.  Kyrstin is hopeful they will find a place but reports if not, she is firm that Chad will not live in her house and disrespect her.  She states she turned her other children out at 68 when they refused to live by house rules.  Paiten reports she has gone to [employer] twice and worked with a Physiological scientist.  She enjoyed herself and felt good. She likes this gym and thinks that going twice a week would be good

## 2016-09-11 NOTE — Patient Instructions (Signed)
Please schedule a follow-up for 9/24th at 8:30.    Good luck with the gym.  I think that is great.  Two times a week would be really good.

## 2016-09-11 NOTE — Assessment & Plan Note (Signed)
Report of mood today is euthymic.  She doesn't seem to be dipping too low in the face of this news about [place].  She remains hopeful.  The relationship between her and [name] is so broken.  [place] living in the home, behaving as she has in the past, would present a significant stress on [place] that would be poor for her overall health.  Will continue to provide emotional support and support for healthy behaviors such as going to the gym.

## 2016-10-01 ENCOUNTER — Ambulatory Visit (INDEPENDENT_AMBULATORY_CARE_PROVIDER_SITE_OTHER): Payer: Medicare HMO | Admitting: Psychology

## 2016-10-01 DIAGNOSIS — F411 Generalized anxiety disorder: Secondary | ICD-10-CM

## 2016-10-01 DIAGNOSIS — F331 Major depressive disorder, recurrent, moderate: Secondary | ICD-10-CM

## 2016-10-01 NOTE — Patient Instructions (Signed)
Please schedule a follow-up for:  October 24th at 8:30.  Call in between if you need anything.    Dr. Rosalyn Gess had asked you to schedule an appointment with Ander Purpura, our nurse, for an annual visit.  You can ask up front of that is something you can do.  I think this would be helpful.    I am so glad you are heading to the Y.  It is so important for your stress to move your body regularly.  Keep it up.    There isn't much more to say regarding your situation.  Take as good of care of yourself as you can and make sure you get pockets of time outside of the house that feed you emotionally and socially.

## 2016-10-01 NOTE — Assessment & Plan Note (Signed)
See assessment under depression.

## 2016-10-01 NOTE — Assessment & Plan Note (Signed)
GAD is more elevated than PHQ-9 with the most significant symptoms relating to worry, feeling on edge, and difficulty relaxing.  This is to be expected given the recent change of [name] being back home.  Brantlee continues to have good support with other family members, reports going to the Y occasionally.  She also has a plan if things get too out of hand.  I am not sure what else to offer her at this point other than support.  See patient instructions for further plan.

## 2016-10-01 NOTE — Progress Notes (Signed)
Reason for follow-up:  Continued support with mood management in the context of a chronic stressor.  Issues discussed:  [name] returned home a week ago.  Erianna reports feeling sad, anxious, and mostly angry since she learned that she would be returning.  They have mostly picked up where they left off with [place] behaving in ways that are not acceptable to [place].  Cabella does report she has a hard stop especially regarding [name] leaving late at night to go meet up with people she finds on-line.  If they get into a pattern similar to the one prior to [facility] going to this last treatment center, Donnice plans to remove herself as 67 legal guardian.  We discussed this at length.

## 2016-10-15 DIAGNOSIS — Z79891 Long term (current) use of opiate analgesic: Secondary | ICD-10-CM | POA: Diagnosis not present

## 2016-10-15 DIAGNOSIS — G894 Chronic pain syndrome: Secondary | ICD-10-CM | POA: Diagnosis not present

## 2016-10-15 DIAGNOSIS — M545 Low back pain: Secondary | ICD-10-CM | POA: Diagnosis not present

## 2016-10-15 DIAGNOSIS — M25559 Pain in unspecified hip: Secondary | ICD-10-CM | POA: Diagnosis not present

## 2016-10-17 ENCOUNTER — Ambulatory Visit: Payer: Medicare HMO

## 2016-10-19 ENCOUNTER — Ambulatory Visit: Payer: Medicare HMO | Admitting: *Deleted

## 2016-10-31 ENCOUNTER — Ambulatory Visit (INDEPENDENT_AMBULATORY_CARE_PROVIDER_SITE_OTHER): Payer: Medicare HMO | Admitting: Psychology

## 2016-10-31 DIAGNOSIS — F321 Major depressive disorder, single episode, moderate: Secondary | ICD-10-CM
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
## 2016-10-31 NOTE — Patient Instructions (Signed)
Please schedule a follow-up for 11/16/2016 at 8:30.    This is so stressful.  I think taking care of yourself and your health needs to come first.

## 2016-10-31 NOTE — Progress Notes (Signed)
Reason for follow-up:  Continued mood management in the context of chronic stress.    Issues discussed:  Stacy Campbell reports that things have deteriorated further with Stacy Campbell.  The details are not important - they are similar to ones she has shared in the past.  She reports her brother (who lives with her) had a mild heart attack recently and Stacy Campbell has recently had some vertigo.  She thinks the stress of living with Stacy Campbell is taking a significant toll on her physical health.

## 2016-11-06 ENCOUNTER — Ambulatory Visit (INDEPENDENT_AMBULATORY_CARE_PROVIDER_SITE_OTHER): Payer: Medicare HMO | Admitting: *Deleted

## 2016-11-06 DIAGNOSIS — Z23 Encounter for immunization: Secondary | ICD-10-CM | POA: Diagnosis not present

## 2016-11-14 DIAGNOSIS — M545 Low back pain: Secondary | ICD-10-CM | POA: Diagnosis not present

## 2016-11-14 DIAGNOSIS — M25559 Pain in unspecified hip: Secondary | ICD-10-CM | POA: Diagnosis not present

## 2016-11-14 DIAGNOSIS — G894 Chronic pain syndrome: Secondary | ICD-10-CM | POA: Diagnosis not present

## 2016-11-14 DIAGNOSIS — Z79891 Long term (current) use of opiate analgesic: Secondary | ICD-10-CM | POA: Diagnosis not present

## 2016-11-16 ENCOUNTER — Ambulatory Visit (INDEPENDENT_AMBULATORY_CARE_PROVIDER_SITE_OTHER): Payer: Medicare HMO | Admitting: Psychology

## 2016-11-16 DIAGNOSIS — F321 Major depressive disorder, single episode, moderate: Secondary | ICD-10-CM | POA: Diagnosis not present

## 2016-11-16 NOTE — Progress Notes (Signed)
Reason for follow-up:  Continued mood management in the context of chronic stress.  Issues discussed:  [name] investigated the possibility of getting Stacy Campbell out of the house and learned that if she does this, [name] would be taken as well.  This is not an option for Stacy Campbell so she is feeling very stuck.  Things continue to be severely dysfunctional.

## 2016-11-16 NOTE — Patient Instructions (Signed)
Please schedule a follow-up for:  December 6th at 8:30.    Move your body!!

## 2016-11-16 NOTE — Assessment & Plan Note (Signed)
Stacy Campbell reports she is "numbing" herself - shutting down.  This has been a long-term strategy that is only really useful in the short-term except she doesn't have a lot of options.  I think next time, we may work on developing a survival plan.  Create a list of activities that she can do or think about doing that helps give her a semblance of control over her own life.  Will follow as scheduled.  She continues to say she benefits from the support.

## 2016-11-27 ENCOUNTER — Other Ambulatory Visit: Payer: Self-pay | Admitting: Family Medicine

## 2016-11-27 MED ORDER — CARVEDILOL 25 MG PO TABS: 25.0000 mg | ORAL_TABLET | Freq: Two times a day (BID) | ORAL | 3 refills | Status: DC

## 2016-12-10 ENCOUNTER — Other Ambulatory Visit: Payer: Self-pay | Admitting: Gastroenterology

## 2016-12-10 ENCOUNTER — Other Ambulatory Visit: Payer: Self-pay | Admitting: Family Medicine

## 2016-12-10 DIAGNOSIS — Z139 Encounter for screening, unspecified: Secondary | ICD-10-CM

## 2016-12-13 ENCOUNTER — Ambulatory Visit (INDEPENDENT_AMBULATORY_CARE_PROVIDER_SITE_OTHER): Payer: Medicare HMO | Admitting: Psychology

## 2016-12-13 DIAGNOSIS — F321 Major depressive disorder, single episode, moderate: Secondary | ICD-10-CM

## 2016-12-13 NOTE — Patient Instructions (Signed)
Please call Dr. Gwenlyn Saran after the 1st of the year to schedule another appointment.    Getting out of the house is vital to your mental health!  Please continue to look for opportunities.

## 2016-12-13 NOTE — Assessment & Plan Note (Signed)
She appears disgusted - like she ate something bitter.  She is stuck in a situation that makes her physically and emotionally unwell.  I think the best thing for her is to get out of the house and burn off some of the stress hormones via moving her body.  We have talked about this several times.  She has [facility] gone to the Y but is currently not.  Discussed again the toll this stress is taking on her health and that when [facility] finally is made to leave (at age 81), that [place] will want some good physical health left-over.    Schedule for January isn't done yet.  She will call to schedule after the 1st of the year.

## 2016-12-13 NOTE — Progress Notes (Signed)
Reason for follow-up:  Continued support / mood management in the context of a severe chronic stressor.  Issues discussed:  The same as we always discuss.  Things continue to be very bad.  Getting out of the house is made difficult secondary to not trusting Stacy Campbell to be at the house by herself.  She has a few options but her brother Stacy Campbell is finding it increasingly hard to be around [place] as well.  She did engage on Saturday and was gone most the day with a niece.  This was good.  The challenge is that she has to return home and when she does, the weight of the situation is overwhelming.

## 2016-12-14 ENCOUNTER — Ambulatory Visit: Payer: Medicare HMO | Admitting: Family Medicine

## 2016-12-14 DIAGNOSIS — M25559 Pain in unspecified hip: Secondary | ICD-10-CM | POA: Diagnosis not present

## 2016-12-14 DIAGNOSIS — G894 Chronic pain syndrome: Secondary | ICD-10-CM | POA: Diagnosis not present

## 2016-12-14 DIAGNOSIS — M545 Low back pain: Secondary | ICD-10-CM | POA: Diagnosis not present

## 2016-12-14 DIAGNOSIS — Z79891 Long term (current) use of opiate analgesic: Secondary | ICD-10-CM | POA: Diagnosis not present

## 2017-01-09 ENCOUNTER — Ambulatory Visit
Admission: RE | Admit: 2017-01-09 | Discharge: 2017-01-09 | Disposition: A | Discharge status: Home / Self Care | Payer: Medicare HMO | Source: Ambulatory Visit | Attending: Gastroenterology | Admitting: Gastroenterology

## 2017-01-09 DIAGNOSIS — Z1231 Encounter for screening mammogram for malignant neoplasm of breast: Secondary | ICD-10-CM | POA: Diagnosis not present

## 2017-01-09 DIAGNOSIS — Z139 Encounter for screening, unspecified: Secondary | ICD-10-CM

## 2017-01-14 ENCOUNTER — Ambulatory Visit (INDEPENDENT_AMBULATORY_CARE_PROVIDER_SITE_OTHER): Payer: Medicare HMO | Admitting: Family Medicine

## 2017-01-14 ENCOUNTER — Other Ambulatory Visit (HOSPITAL_COMMUNITY)
Admission: RE | Admit: 2017-01-14 | Discharge: 2017-01-14 | Disposition: A | Discharge status: Home / Self Care | Payer: Medicare HMO | Source: Ambulatory Visit | Attending: Family Medicine | Admitting: Family Medicine

## 2017-01-14 ENCOUNTER — Other Ambulatory Visit: Payer: Self-pay

## 2017-01-14 ENCOUNTER — Encounter: Payer: Self-pay | Admitting: Family Medicine

## 2017-01-14 VITALS — BP 126/84 | HR 101 | Temp 98.3°F | Ht 65.0 in | Wt 232.4 lb

## 2017-01-14 DIAGNOSIS — F411 Generalized anxiety disorder: Secondary | ICD-10-CM

## 2017-01-14 DIAGNOSIS — Z124 Encounter for screening for malignant neoplasm of cervix: Secondary | ICD-10-CM

## 2017-01-14 DIAGNOSIS — R87619 Unspecified abnormal cytological findings in specimens from cervix uteri: Secondary | ICD-10-CM | POA: Diagnosis not present

## 2017-01-14 MED ORDER — BENZONATATE 100 MG PO CAPS: 100.0000 mg | ORAL_CAPSULE | Freq: Three times a day (TID) | ORAL | 3 refills | Status: DC | PRN

## 2017-01-14 MED ORDER — ALPRAZOLAM 1 MG PO TABS: ORAL_TABLET | ORAL | 3 refills | Status: DC

## 2017-01-14 MED ORDER — PROMETHAZINE HCL 25 MG PO TABS: ORAL_TABLET | ORAL | 1 refills | Status: DC

## 2017-01-14 MED ORDER — ZOLPIDEM TARTRATE 10 MG PO TABS: 10.0000 mg | ORAL_TABLET | Freq: Every evening | ORAL | 5 refills | Status: DC | PRN

## 2017-01-14 NOTE — Patient Instructions (Addendum)
Ms. Rosete, you were seen today for a Pap smear.  I will call you if there are any problems.  I also refilled some of your medication.  Please follow-up with Dr. Gwenlyn Saran and you can follow-up with me as needed.  I would look into if there are any dermatologists in the area that are accepting.  Otherwise I would continue with the dermatologist in [place] that you are already established with.   It was very nice to see you today.  Take care.  Daniel L. Rosalyn Gess, [place] Resident PGY-2 01/14/2017 10:42 AM

## 2017-01-14 NOTE — Progress Notes (Signed)
.  pap

## 2017-01-14 NOTE — Progress Notes (Signed)
    Subjective:  Stacy Campbell is a 62 y.o. female who presents to the [facility] today for Pap smear  HPI:  Screening for cervical cancer Patient denies any vaginal bleeding, discharge, abdominal pain, nausea, vomiting or diarrhea.  She has had no history of abnormal Pap smears in the past and is due for one currently.  She has no current complaints.   Anxiety and depression: Stable  Insomnia: Persistent, stable.  On chronic Ambien (started by previous provider)  PMH: Hypertension, depression and anxiety, chronic lower back pain Tobacco use: Former smoker Medication: reviewed and updated ROS: see HPI   Objective:  Physical Exam: BP 126/84   Pulse (!) 101   Temp 98.3 F (36.8 C) (Oral)   Ht 5\' 5"  (1.651 m)   Wt 232 lb 6.4 oz (105.4 kg)   SpO2 98%   BMI 38.67 kg/m   Gen: 62 year old female in NAD, resting comfortably CV: RRR with no murmurs appreciated Pulm: NWOB, CTAB with no crackles, wheezes, or rhonchi GI: Normal bowel sounds present. Soft, Nontender, Nondistended. MSK: no edema, cyanosis, or clubbing noted Skin: warm, dry GU: Normal-appearing external female genitalia without lesions.  Speculum exam revealed no discharge or blood in the vaginal vault.  Cervix was closed and normal-appearing.  Neuro: grossly normal, moves all extremities Psych: Normal affect and thought content  No results found for this or any previous visit (from the past 72 hour(s)).   Assessment/Plan:  Screen for cervical cancer Patient had a Pap smear today and had normal exam.  She has had no previously abnormal Pap smears.  We will follow-up results with patient.  Discussed return precautions.  Depression and anxiety Patient denies any thoughts of self-harm or harming others.  She has follow-up scheduled with Dr. Gwenlyn Saran in the next couple of weeks.  Refilled Xanax for the next 3 months.   Insomnia Discussed concerns for Ambien use.  Refilled for as needed use.   Healthcare  maintenance Performed Pap smear today.  Otherwise up-to-date.  Cobie Leidner L. Rosalyn Gess, [place] Resident PGY-2 01/15/2017 3:18 PM

## 2017-01-15 ENCOUNTER — Encounter: Payer: Self-pay | Admitting: Family Medicine

## 2017-01-16 LAB — CYTOLOGY - PAP
Diagnosis: UNDETERMINED — AB
HPV (WINDOPATH): NOT DETECTED

## 2017-01-18 ENCOUNTER — Telehealth: Payer: Self-pay | Admitting: Family Medicine

## 2017-01-18 ENCOUNTER — Telehealth: Payer: Self-pay | Admitting: *Deleted

## 2017-01-18 NOTE — Telephone Encounter (Signed)
Called patient about pap showing ASC-US. Had to leave message. Asked her to call the office back. Plan for HPV testing and will go from there.   Lorin Hauck L. Rosalyn Gess, [place] Resident PGY-2 01/18/2017 3:36 PM

## 2017-01-18 NOTE — Telephone Encounter (Signed)
Pt calling back to talk to dr Rosalyn Gess, please call her back. Stacy Campbell Holter, CMA

## 2017-01-21 DIAGNOSIS — M545 Low back pain: Secondary | ICD-10-CM | POA: Diagnosis not present

## 2017-01-21 DIAGNOSIS — M25559 Pain in unspecified hip: Secondary | ICD-10-CM | POA: Diagnosis not present

## 2017-01-21 DIAGNOSIS — G894 Chronic pain syndrome: Secondary | ICD-10-CM | POA: Diagnosis not present

## 2017-01-21 DIAGNOSIS — Z79891 Long term (current) use of opiate analgesic: Secondary | ICD-10-CM | POA: Diagnosis not present

## 2017-01-21 NOTE — Telephone Encounter (Signed)
Open encounter clean up.Stacy Campbell R, CMA   

## 2017-01-21 NOTE — Telephone Encounter (Signed)
Patient left message on nurse line requesting return call from PCP to discuss results. Hubbard Hartshorn, RN, BSN

## 2017-01-22 ENCOUNTER — Ambulatory Visit (INDEPENDENT_AMBULATORY_CARE_PROVIDER_SITE_OTHER): Payer: Self-pay | Admitting: Psychology

## 2017-01-22 ENCOUNTER — Telehealth: Payer: Self-pay | Admitting: Family Medicine

## 2017-01-22 DIAGNOSIS — F321 Major depressive disorder, single episode, moderate: Secondary | ICD-10-CM

## 2017-01-22 NOTE — Telephone Encounter (Signed)
She received a message to call Dr. Rosalyn Gess about a pap result.  Please call patient w/results at [phone].
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
## 2017-01-22 NOTE — Patient Instructions (Signed)
Please schedule a follow-up for:  February 19 at 8:30.  Call me in between if need be.    I hope you get some resolution soon.  Good luck with the meeting today.

## 2017-01-22 NOTE — Assessment & Plan Note (Signed)
Patient reports continued depressed mood regarding terrible psychosocial situation.  Currently, her approach is to not engage unless it is around a household matter.  She is holding onto hope that [place] will be removed from the home with this latest development. The therapist that visits regularly continues to suggest things to "try" with [facility].  In my experience, this home situation is detrimental to Ms. Bielby's mental and physical health and not helpful in creating a positive way forward for [facility].    Will continue to provide support and hope that something shifts in the home.  Will follow in about one month.  I asked for a call if anything changed before.

## 2017-01-22 NOTE — Telephone Encounter (Signed)
No answer.  Left message briefly told her about abnormal cells on pap.  She needs HPV testing.  Asked her to call to make appointment for Thursday or Friday.  Using a same day spot is fine.  Asked her to call back if she has questions.  Thanks

## 2017-01-25 ENCOUNTER — Ambulatory Visit: Payer: Medicare HMO | Admitting: Family Medicine

## 2017-01-28 ENCOUNTER — Other Ambulatory Visit: Payer: Self-pay | Admitting: Gastroenterology

## 2017-01-28 DIAGNOSIS — K7460 Unspecified cirrhosis of liver: Secondary | ICD-10-CM

## 2017-01-29 ENCOUNTER — Inpatient Hospital Stay (HOSPITAL_COMMUNITY): Payer: Medicare HMO | Admitting: Certified Registered Nurse Anesthetist

## 2017-01-29 ENCOUNTER — Inpatient Hospital Stay (HOSPITAL_COMMUNITY)
Admission: EM | Admit: 2017-01-29 | Discharge: 2017-02-01 | DRG: 660 | Disposition: A | Discharge status: Home / Self Care | Payer: Medicare HMO | Attending: Family Medicine | Admitting: Family Medicine

## 2017-01-29 ENCOUNTER — Other Ambulatory Visit: Payer: Self-pay

## 2017-01-29 ENCOUNTER — Emergency Department (HOSPITAL_COMMUNITY): Payer: Medicare HMO

## 2017-01-29 ENCOUNTER — Inpatient Hospital Stay (HOSPITAL_COMMUNITY): Payer: Medicare HMO

## 2017-01-29 ENCOUNTER — Encounter (HOSPITAL_COMMUNITY): Payer: Self-pay | Admitting: Emergency Medicine

## 2017-01-29 ENCOUNTER — Encounter (HOSPITAL_COMMUNITY)
Admission: EM | Disposition: A | Discharge status: Home / Self Care | Payer: Self-pay | Source: Home / Self Care | Attending: Family Medicine

## 2017-01-29 DIAGNOSIS — N8110 Cystocele, unspecified: Secondary | ICD-10-CM | POA: Diagnosis not present

## 2017-01-29 DIAGNOSIS — J449 Chronic obstructive pulmonary disease, unspecified: Secondary | ICD-10-CM | POA: Diagnosis present

## 2017-01-29 DIAGNOSIS — N179 Acute kidney failure, unspecified: Secondary | ICD-10-CM | POA: Diagnosis present

## 2017-01-29 DIAGNOSIS — N138 Other obstructive and reflux uropathy: Secondary | ICD-10-CM | POA: Diagnosis not present

## 2017-01-29 DIAGNOSIS — Z87891 Personal history of nicotine dependence: Secondary | ICD-10-CM

## 2017-01-29 DIAGNOSIS — Z6837 Body mass index (BMI) 37.0-37.9, adult: Secondary | ICD-10-CM

## 2017-01-29 DIAGNOSIS — R269 Unspecified abnormalities of gait and mobility: Secondary | ICD-10-CM | POA: Diagnosis not present

## 2017-01-29 DIAGNOSIS — R161 Splenomegaly, not elsewhere classified: Secondary | ICD-10-CM | POA: Diagnosis present

## 2017-01-29 DIAGNOSIS — J45909 Unspecified asthma, uncomplicated: Secondary | ICD-10-CM | POA: Diagnosis not present

## 2017-01-29 DIAGNOSIS — R1012 Left upper quadrant pain: Secondary | ICD-10-CM

## 2017-01-29 DIAGNOSIS — I776 Arteritis, unspecified: Secondary | ICD-10-CM | POA: Diagnosis present

## 2017-01-29 DIAGNOSIS — G4700 Insomnia, unspecified: Secondary | ICD-10-CM | POA: Diagnosis present

## 2017-01-29 DIAGNOSIS — M545 Low back pain: Secondary | ICD-10-CM | POA: Diagnosis present

## 2017-01-29 DIAGNOSIS — N131 Hydronephrosis with ureteral stricture, not elsewhere classified: Secondary | ICD-10-CM | POA: Diagnosis not present

## 2017-01-29 DIAGNOSIS — F329 Major depressive disorder, single episode, unspecified: Secondary | ICD-10-CM | POA: Diagnosis present

## 2017-01-29 DIAGNOSIS — Z79899 Other long term (current) drug therapy: Secondary | ICD-10-CM | POA: Diagnosis not present

## 2017-01-29 DIAGNOSIS — I10 Essential (primary) hypertension: Secondary | ICD-10-CM | POA: Diagnosis not present

## 2017-01-29 DIAGNOSIS — F419 Anxiety disorder, unspecified: Secondary | ICD-10-CM | POA: Diagnosis present

## 2017-01-29 DIAGNOSIS — G8929 Other chronic pain: Secondary | ICD-10-CM | POA: Diagnosis present

## 2017-01-29 DIAGNOSIS — N1330 Unspecified hydronephrosis: Secondary | ICD-10-CM | POA: Diagnosis not present

## 2017-01-29 DIAGNOSIS — R011 Cardiac murmur, unspecified: Secondary | ICD-10-CM | POA: Diagnosis present

## 2017-01-29 DIAGNOSIS — Z79891 Long term (current) use of opiate analgesic: Secondary | ICD-10-CM

## 2017-01-29 DIAGNOSIS — L309 Dermatitis, unspecified: Secondary | ICD-10-CM | POA: Diagnosis not present

## 2017-01-29 DIAGNOSIS — Z91018 Allergy to other foods: Secondary | ICD-10-CM

## 2017-01-29 DIAGNOSIS — M1990 Unspecified osteoarthritis, unspecified site: Secondary | ICD-10-CM | POA: Diagnosis present

## 2017-01-29 DIAGNOSIS — Z886 Allergy status to analgesic agent status: Secondary | ICD-10-CM

## 2017-01-29 DIAGNOSIS — R109 Unspecified abdominal pain: Secondary | ICD-10-CM | POA: Diagnosis present

## 2017-01-29 DIAGNOSIS — R229 Localized swelling, mass and lump, unspecified: Secondary | ICD-10-CM | POA: Diagnosis not present

## 2017-01-29 DIAGNOSIS — R1032 Left lower quadrant pain: Secondary | ICD-10-CM | POA: Diagnosis not present

## 2017-01-29 DIAGNOSIS — R1084 Generalized abdominal pain: Secondary | ICD-10-CM | POA: Diagnosis not present

## 2017-01-29 DIAGNOSIS — Z885 Allergy status to narcotic agent status: Secondary | ICD-10-CM

## 2017-01-29 HISTORY — DX: Other chronic pain: G89.29

## 2017-01-29 HISTORY — DX: Low back pain, unspecified: M54.50

## 2017-01-29 HISTORY — PX: CYSTOSCOPY WITH RETROGRADE PYELOGRAM, URETEROSCOPY AND STENT PLACEMENT: SHX5789

## 2017-01-29 HISTORY — DX: Personal history of other diseases of the circulatory system: Z86.79

## 2017-01-29 HISTORY — DX: Gastro-esophageal reflux disease without esophagitis: K21.9

## 2017-01-29 HISTORY — PX: CYSTOSCOPY W/ URETERAL STENT PLACEMENT: SHX1429

## 2017-01-29 HISTORY — DX: Headache: R51

## 2017-01-29 HISTORY — DX: Low back pain: M54.5

## 2017-01-29 LAB — CBC
HCT: 39.5 % (ref 36.0–46.0)
Hemoglobin: 12.4 g/dL (ref 12.0–15.0)
MCH: 24 pg — ABNORMAL LOW (ref 26.0–34.0)
MCHC: 31.4 g/dL (ref 30.0–36.0)
MCV: 76.6 fL — ABNORMAL LOW (ref 78.0–100.0)
Platelets: 155 10*3/uL (ref 150–400)
RBC: 5.16 MIL/uL — AB (ref 3.87–5.11)
RDW: 15.6 % — ABNORMAL HIGH (ref 11.5–15.5)
WBC: 3.8 10*3/uL — AB (ref 4.0–10.5)

## 2017-01-29 LAB — URINALYSIS, MICROSCOPIC (REFLEX): Squamous Epithelial / LPF: NONE SEEN

## 2017-01-29 LAB — URINALYSIS, ROUTINE W REFLEX MICROSCOPIC
BILIRUBIN URINE: NEGATIVE
BILIRUBIN URINE: NEGATIVE
Bacteria, UA: NONE SEEN
Glucose, UA: NEGATIVE mg/dL
Glucose, UA: NEGATIVE mg/dL
HGB URINE DIPSTICK: NEGATIVE
Ketones, ur: NEGATIVE mg/dL
Ketones, ur: NEGATIVE mg/dL
Leukocytes, UA: NEGATIVE
NITRITE: NEGATIVE
NITRITE: NEGATIVE
PROTEIN: NEGATIVE mg/dL
PROTEIN: NEGATIVE mg/dL
Specific Gravity, Urine: 1.008 (ref 1.005–1.030)
Specific Gravity, Urine: <1.005 — ABNORMAL LOW (ref 1.005–1.030)
pH: 6 (ref 5.0–8.0)
pH: 6 (ref 5.0–8.0)

## 2017-01-29 LAB — COMPREHENSIVE METABOLIC PANEL
ALK PHOS: 84 U/L (ref 38–126)
ALT: 38 U/L (ref 14–54)
ANION GAP: 11 (ref 5–15)
AST: 43 U/L — ABNORMAL HIGH (ref 15–41)
Albumin: 3.5 g/dL (ref 3.5–5.0)
BILIRUBIN TOTAL: 0.6 mg/dL (ref 0.3–1.2)
BUN: 6 mg/dL (ref 6–20)
CALCIUM: 9.1 mg/dL (ref 8.9–10.3)
CO2: 23 mmol/L (ref 22–32)
Chloride: 105 mmol/L (ref 101–111)
Creatinine, Ser: 1.11 mg/dL — ABNORMAL HIGH (ref 0.44–1.00)
GFR calc non Af Amer: 52 mL/min — ABNORMAL LOW (ref >60)
Glucose, Bld: 116 mg/dL — ABNORMAL HIGH (ref 65–99)
Potassium: 3.4 mmol/L — ABNORMAL LOW (ref 3.5–5.1)
SODIUM: 139 mmol/L (ref 135–145)
TOTAL PROTEIN: 7.7 g/dL (ref 6.5–8.1)

## 2017-01-29 LAB — LIPASE, BLOOD: Lipase: 30 U/L (ref 11–51)

## 2017-01-29 LAB — C-REACTIVE PROTEIN: CRP: 1.5 mg/dL — ABNORMAL HIGH (ref <1.0)

## 2017-01-29 LAB — SEDIMENTATION RATE: SED RATE: 50 mm/h — AB (ref 0–22)

## 2017-01-29 SURGERY — CYSTOSCOPY, WITH RETROGRADE PYELOGRAM AND URETERAL STENT INSERTION
Anesthesia: General | Laterality: Left

## 2017-01-29 MED ORDER — IOPAMIDOL (ISOVUE-300) INJECTION 61%
INTRAVENOUS | Status: DC | PRN
  Administered 2017-01-29: 15 mL

## 2017-01-29 MED ORDER — FENTANYL CITRATE (PF) 100 MCG/2ML IJ SOLN
25.0000 ug | INTRAMUSCULAR | Status: DC | PRN
  Administered 2017-01-29: 50 ug via INTRAVENOUS

## 2017-01-29 MED ORDER — ACETAMINOPHEN 325 MG PO TABS
650.0000 mg | ORAL_TABLET | Freq: Four times a day (QID) | ORAL | Status: DC | PRN
  Administered 2017-01-30: 650 mg via ORAL
  Filled 2017-01-29: qty 2

## 2017-01-29 MED ORDER — MIDAZOLAM HCL 2 MG/2ML IJ SOLN
INTRAMUSCULAR | Status: AC
  Filled 2017-01-29: qty 2

## 2017-01-29 MED ORDER — CARVEDILOL 25 MG PO TABS
25.0000 mg | ORAL_TABLET | Freq: Two times a day (BID) | ORAL | Status: DC
  Administered 2017-01-29 – 2017-02-01 (×7): 25 mg via ORAL
  Filled 2017-01-29 (×8): qty 1

## 2017-01-29 MED ORDER — SERTRALINE HCL 50 MG PO TABS
150.0000 mg | ORAL_TABLET | Freq: Every day | ORAL | Status: DC
  Administered 2017-01-30 – 2017-01-31 (×3): 150 mg via ORAL
  Filled 2017-01-29 (×4): qty 1

## 2017-01-29 MED ORDER — ALBUTEROL SULFATE (2.5 MG/3ML) 0.083% IN NEBU: 2.5000 mg | INHALATION_SOLUTION | Freq: Four times a day (QID) | RESPIRATORY_TRACT | Status: DC | PRN

## 2017-01-29 MED ORDER — ONDANSETRON HCL 4 MG/2ML IJ SOLN
INTRAMUSCULAR | Status: DC | PRN
  Administered 2017-01-29: 4 mg via INTRAVENOUS

## 2017-01-29 MED ORDER — ENOXAPARIN SODIUM 40 MG/0.4ML ~~LOC~~ SOLN
40.0000 mg | SUBCUTANEOUS | Status: DC
  Administered 2017-01-29 – 2017-01-31 (×3): 40 mg via SUBCUTANEOUS
  Filled 2017-01-29 (×3): qty 0.4

## 2017-01-29 MED ORDER — POLYETHYLENE GLYCOL 3350 17 G PO PACK: 17.0000 g | PACK | Freq: Every day | ORAL | Status: DC | PRN

## 2017-01-29 MED ORDER — PROPOFOL 10 MG/ML IV BOLUS
INTRAVENOUS | Status: DC | PRN
  Administered 2017-01-29: 90 mg via INTRAVENOUS

## 2017-01-29 MED ORDER — LIDOCAINE HCL (CARDIAC) 20 MG/ML IV SOLN
INTRAVENOUS | Status: DC | PRN
  Administered 2017-01-29: 60 mg via INTRAVENOUS

## 2017-01-29 MED ORDER — FENTANYL CITRATE (PF) 100 MCG/2ML IJ SOLN
INTRAMUSCULAR | Status: DC | PRN
  Administered 2017-01-29: 50 ug via INTRAVENOUS

## 2017-01-29 MED ORDER — LACTATED RINGERS IV SOLN
INTRAVENOUS | Status: DC
  Administered 2017-01-29 (×2): via INTRAVENOUS

## 2017-01-29 MED ORDER — IOPAMIDOL (ISOVUE-300) INJECTION 61%
INTRAVENOUS | Status: AC
  Administered 2017-01-29: 100 mL
  Filled 2017-01-29: qty 100

## 2017-01-29 MED ORDER — MONTELUKAST SODIUM 10 MG PO TABS
10.0000 mg | ORAL_TABLET | Freq: Every day | ORAL | Status: DC | PRN
  Filled 2017-01-29: qty 1

## 2017-01-29 MED ORDER — DEXAMETHASONE SODIUM PHOSPHATE 4 MG/ML IJ SOLN
INTRAMUSCULAR | Status: DC | PRN
  Administered 2017-01-29: 5 mg via INTRAVENOUS

## 2017-01-29 MED ORDER — ZOLPIDEM TARTRATE 5 MG PO TABS
5.0000 mg | ORAL_TABLET | Freq: Every evening | ORAL | Status: DC | PRN
  Administered 2017-01-30 – 2017-01-31 (×3): 5 mg via ORAL
  Filled 2017-01-29 (×3): qty 1

## 2017-01-29 MED ORDER — FENTANYL CITRATE (PF) 250 MCG/5ML IJ SOLN
INTRAMUSCULAR | Status: AC
  Filled 2017-01-29: qty 5

## 2017-01-29 MED ORDER — ARIPIPRAZOLE 5 MG PO TABS: 5.0000 mg | ORAL_TABLET | Freq: Every day | ORAL | Status: DC

## 2017-01-29 MED ORDER — IOPAMIDOL (ISOVUE-300) INJECTION 61%
INTRAVENOUS | Status: AC
  Filled 2017-01-29: qty 50

## 2017-01-29 MED ORDER — MORPHINE SULFATE (PF) 4 MG/ML IV SOLN
4.0000 mg | Freq: Once | INTRAVENOUS | Status: AC
  Administered 2017-01-29: 4 mg via INTRAVENOUS
  Filled 2017-01-29: qty 1

## 2017-01-29 MED ORDER — MORPHINE SULFATE (PF) 4 MG/ML IV SOLN
4.0000 mg | Freq: Once | INTRAVENOUS | Status: AC
Start: 2017-01-29 — End: 2017-01-29
  Administered 2017-01-29: 4 mg via INTRAVENOUS
  Filled 2017-01-29: qty 1

## 2017-01-29 MED ORDER — CEFAZOLIN SODIUM-DEXTROSE 2-3 GM-%(50ML) IV SOLR
INTRAVENOUS | Status: DC | PRN
  Administered 2017-01-29: 2 g via INTRAVENOUS

## 2017-01-29 MED ORDER — ONDANSETRON HCL 4 MG/2ML IJ SOLN
4.0000 mg | Freq: Once | INTRAMUSCULAR | Status: AC
  Administered 2017-01-29: 4 mg via INTRAVENOUS
  Filled 2017-01-29: qty 2

## 2017-01-29 MED ORDER — ACETAMINOPHEN 650 MG RE SUPP: 650.0000 mg | Freq: Four times a day (QID) | RECTAL | Status: DC | PRN

## 2017-01-29 MED ORDER — OXYCODONE-ACETAMINOPHEN 5-325 MG PO TABS
2.0000 | ORAL_TABLET | Freq: Four times a day (QID) | ORAL | Status: DC | PRN
Start: 2017-01-29 — End: 2017-01-30
  Administered 2017-01-29 – 2017-01-30 (×4): 2 via ORAL
  Filled 2017-01-29 (×4): qty 2

## 2017-01-29 MED ORDER — MIDAZOLAM HCL 5 MG/5ML IJ SOLN
INTRAMUSCULAR | Status: DC | PRN
  Administered 2017-01-29: 1 mg via INTRAVENOUS

## 2017-01-29 MED ORDER — ARIPIPRAZOLE 5 MG PO TABS
5.0000 mg | ORAL_TABLET | Freq: Every day | ORAL | Status: DC
  Administered 2017-01-30 – 2017-01-31 (×3): 5 mg via ORAL
  Filled 2017-01-29 (×3): qty 1

## 2017-01-29 MED ORDER — FENTANYL CITRATE (PF) 100 MCG/2ML IJ SOLN
INTRAMUSCULAR | Status: AC
  Filled 2017-01-29: qty 2

## 2017-01-29 MED ORDER — ZOLPIDEM TARTRATE 5 MG PO TABS: 10.0000 mg | ORAL_TABLET | Freq: Every evening | ORAL | Status: DC | PRN

## 2017-01-29 MED ORDER — CEFAZOLIN SODIUM-DEXTROSE 2-4 GM/100ML-% IV SOLN
2.0000 g | Freq: Once | INTRAVENOUS | Status: DC
  Filled 2017-01-29: qty 100

## 2017-01-29 MED ORDER — ALBUTEROL SULFATE HFA 108 (90 BASE) MCG/ACT IN AERS: 2.0000 | INHALATION_SPRAY | Freq: Four times a day (QID) | RESPIRATORY_TRACT | Status: DC | PRN

## 2017-01-29 MED ORDER — LIDOCAINE HCL 2 % EX GEL
CUTANEOUS | Status: AC
  Filled 2017-01-29: qty 20

## 2017-01-29 MED ORDER — PROMETHAZINE HCL 25 MG PO TABS: 25.0000 mg | ORAL_TABLET | Freq: Three times a day (TID) | ORAL | Status: DC | PRN

## 2017-01-29 MED ORDER — AMLODIPINE BESYLATE 10 MG PO TABS
10.0000 mg | ORAL_TABLET | Freq: Every day | ORAL | Status: DC
  Administered 2017-01-29 – 2017-02-01 (×4): 10 mg via ORAL
  Filled 2017-01-29: qty 2
  Filled 2017-01-29 (×4): qty 1

## 2017-01-29 MED ORDER — LORATADINE 10 MG PO TABS: 10.0000 mg | ORAL_TABLET | Freq: Every day | ORAL | Status: DC | PRN

## 2017-01-29 MED ORDER — ALPRAZOLAM 0.5 MG PO TABS
1.0000 mg | ORAL_TABLET | Freq: Two times a day (BID) | ORAL | Status: DC | PRN
  Administered 2017-01-29 (×2): 1 mg via ORAL
  Filled 2017-01-29 (×2): qty 2

## 2017-01-29 SURGICAL SUPPLY — 36 items
ADAPTER CATH URET PLST 4-6FR (CATHETERS) IMPLANT
ADPR CATH URET STRL DISP 4-6FR (CATHETERS)
APL SKNCLS STERI-STRIP NONHPOA (GAUZE/BANDAGES/DRESSINGS)
BAG URINE DRAINAGE (UROLOGICAL SUPPLIES) ×2 IMPLANT
BAG URO CATCHER STRL LF (MISCELLANEOUS) ×2 IMPLANT
BENZOIN TINCTURE PRP APPL 2/3 (GAUZE/BANDAGES/DRESSINGS) IMPLANT
BLADE 10 SAFETY STRL DISP (BLADE) ×2 IMPLANT
BUCKET BIOHAZARD WASTE 5 GAL (MISCELLANEOUS) IMPLANT
CATH FOLEY 2WAY SLVR  5CC 16FR (CATHETERS)
CATH FOLEY 2WAY SLVR 5CC 16FR (CATHETERS) IMPLANT
CATH INTERMIT  6FR 70CM (CATHETERS) IMPLANT
CATH URET 5FR 28IN CONE TIP (BALLOONS)
CATH URET 5FR 70CM CONE TIP (BALLOONS) IMPLANT
COVER SURGICAL LIGHT HANDLE (MISCELLANEOUS) ×2 IMPLANT
DRAPE CAMERA CLOSED 9X96 (DRAPES) ×1 IMPLANT
GLOVE BIO SURGEON STRL SZ7 (GLOVE) ×2 IMPLANT
GOWN STRL REUS W/ TWL XL LVL3 (GOWN DISPOSABLE) ×2 IMPLANT
GOWN STRL REUS W/TWL XL LVL3 (GOWN DISPOSABLE) ×4
GUIDEWIRE ANG ZIPWIRE 038X150 (WIRE) IMPLANT
GUIDEWIRE COOK  .035 (WIRE) IMPLANT
GUIDEWIRE STR DUAL SENSOR (WIRE) ×2 IMPLANT
KIT ROOM TURNOVER OR (KITS) ×2 IMPLANT
MANIFOLD NEPTUNE II (INSTRUMENTS) IMPLANT
NS IRRIG 1000ML POUR BTL (IV SOLUTION) ×2 IMPLANT
PACK CYSTO (CUSTOM PROCEDURE TRAY) ×2 IMPLANT
PAD ARMBOARD 7.5X6 YLW CONV (MISCELLANEOUS) ×4 IMPLANT
PLUG CATH AND CAP STER (CATHETERS) IMPLANT
STENT POLARIS 5FRX24 (STENTS) IMPLANT
STENT POLARIS 5FRX26 (STENTS) IMPLANT
STENT POLARIS 5FRX28 (STENTS) IMPLANT
STENT URET 6FRX24 CONTOUR (STENTS) ×1 IMPLANT
SYR CONTROL 10ML LL (SYRINGE) ×2 IMPLANT
SYRINGE TOOMEY DISP (SYRINGE) IMPLANT
UNDERPAD 30X30 (UNDERPADS AND DIAPERS) ×2 IMPLANT
WATER STERILE IRR 1000ML POUR (IV SOLUTION) ×2 IMPLANT
WIRE COONS/BENSON .038X145CM (WIRE) IMPLANT

## 2017-01-29 NOTE — Transfer of Care (Signed)
Immediate Anesthesia Transfer of Care Note  Patient: Stacy Campbell  Procedure(s) Performed: CYSTOSCOPY WITH RETROGRADE PYELOGRAM/URETERAL STENT PLACEMENT (Left )  Patient Location: PACU  Anesthesia Type:General  Level of Consciousness: awake, alert , oriented and patient cooperative  Airway & Oxygen Therapy: Patient Spontanous Breathing  Post-op Assessment: Report given to RN and Post -op Vital signs reviewed and stable  Post vital signs: Reviewed and stable  Last Vitals:  Vitals:   01/29/17 1700 01/29/17 2002  BP: (!) 150/73 137/72  Pulse: 89 79  Resp: 16 15  Temp: 36.8 C 36.9 C  SpO2: 100% 99%    Last Pain:  Vitals:   01/29/17 2002  TempSrc:   PainSc: 0-No pain      Patients Stated Pain Goal: 1 (66/81/59 4707)  Complications: No apparent anesthesia complications

## 2017-01-29 NOTE — Op Note (Signed)
Preoperative Diagnosis: Left hydroureteronephrosis   Postoperative Diagnosis:  Same  Procedure(s) Performed:    1. Cystourethroscopy 2. Left ureteral stent placement, 6Fr x 24cm JJ ureteral without dangler 3. Left retrograde pyelogram 4. Intraoperative fluoroscopy with interpretation <1hr.   Teaching Surgeon:  Louis Meckel, MD  Resident Surgeon:  Jonna Clark, MD  Assistant(s):  None  Anesthesia:  General via LMA  Fluids:  See anesthesia record  Estimated blood loss:  Minimal mL  Specimens:  None   Cultures:  Left renal pelvis urine, for UA and culture   Drains:   LEFT 6Fr x 24cm JJ ureteral stent without dangler  Complications:  None  Indications: 62 y.o. 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 She presents today for cystourethroscopy, left ureteral stent placement, left retrograde pyelogram. Risks & benefits of the procedure discussed with the patient, who wishes to proceed.  Findings:  Prolapsed bladder. 4cm mid ureteral high grade stenosis on left retrograde pyelogram, ending just prior to the pelvic brim. Successful left ureteral stent placement    Description:  The patient was correctly identified in the preop holding area where written informed consent as well potential risk and complication reviewed. The patient agreed. They were brought back to the operative suite where a preinduction timeout was performed. Once correct information was verified, general anesthesia was induced via LMA. They were then gently placed into dorsal lithotomy position with SCDs in place for VTE prophylaxis. They were prepped and draped in the usual sterile fashion and given appropriate preoperative antibiotics with cefazolin. A second timeout was then performed.   We inserted a 52F rigid cystoscope per urethra with copious lubrication and  normal saline irrigation running. This demonstrated a normal urethra, clear yellow urine on entry to the bladder, no bladder mucosal irregularities, tumors, stones or foreign bodies.  We turned our attention to the left ureteral orifice which we cannulated with a combination of a 5 [place] open-ended catheter and a sensor wire. The 5 [place] open-ended catheter was advanced into the left  ureter and the sensor wire was removed. This was advanced several mm just distal to the L UVJ and a left retrograde pyelogram was performed. Findings noted as above. The ureter appeared normal in caliber without evidence of filling defect until about 3cm distal to the left UVJ. From there, a high grade stenosis was noted of the left ureter spanning about 4cm, ending just distal to the left pelvic brim. Hydronephrosis was noted to this level, with contrast passage into the left renal pelvis. The sensor wire was advanced past the stenosis into the renal pelvis, confirmed on fluoroscopy, and the open ended catheter was removed.    We advanced a 6Fr x 24cm JJ ureteral without dangler over the wire with the assistance of a stent pusher under direct visual and fluoroscopic guidance without difficulty. Sensor wire removal demonstrated satisfactory stent curl noted proximally in the left renal pelvis as well as distally within the bladder. Urine with sediment and gross hematuria then began to empty from the renal pelvis. This was obtained and sent for UA and urine culture. It did not appear grossly purulent.   The bladder was empty and all instrumentation was removed. The patient was woken up from anesthesia and taken to the recovery unit for routine postoperative care.  Post Op Plan:   1. Ureteral stent to remain for 3  months 2. Will schedule follow up in 2 months in urology clinic for repeat imaging and to plan for stent exchange versus removal, pending treatment and diagnosis of this retroperitoneal inflammatory mass.  3.  Follow up urine culture and UA

## 2017-01-29 NOTE — Anesthesia Preprocedure Evaluation (Addendum)
Anesthesia Evaluation  Patient identified by MRN, date of birth, ID band Patient awake    Reviewed: Allergy & Precautions, H&P , NPO status , Patient's Chart, lab work & pertinent test results  Airway Mallampati: II  TM Distance: >3 FB Neck ROM: Full    Dental no notable dental hx. (+) Edentulous Upper, Edentulous Lower, Dental Advisory Given   Pulmonary asthma , former smoker,    Pulmonary exam normal breath sounds clear to auscultation       Cardiovascular hypertension, Pt. on medications and Pt. on home beta blockers  Rhythm:Regular Rate:Normal     Neuro/Psych Anxiety Depression negative neurological ROS     GI/Hepatic negative GI ROS, Neg liver ROS,   Endo/Other  Morbid obesity  Renal/GU negative Renal ROS  negative genitourinary   Musculoskeletal  (+) Arthritis , Osteoarthritis,    Abdominal   Peds  Hematology negative hematology ROS (+)   Anesthesia Other Findings   Reproductive/Obstetrics negative OB ROS                            Anesthesia Physical Anesthesia Plan  ASA: III  Anesthesia Plan: General   Post-op Pain Management:    Induction:   PONV Risk Score and Plan: 4 or greater and Ondansetron, Dexamethasone and Midazolam  Airway Management Planned: LMA  Additional Equipment:   Intra-op Plan:   Post-operative Plan: Extubation in OR  Informed Consent: I have reviewed the patients History and Physical, chart, labs and discussed the procedure including the risks, benefits and alternatives for the proposed anesthesia with the patient or authorized representative who has indicated his/her understanding and acceptance.   Dental advisory given  Plan Discussed with: CRNA  Anesthesia Plan Comments:         Anesthesia Quick Evaluation

## 2017-01-29 NOTE — Anesthesia Postprocedure Evaluation (Signed)
Anesthesia Post Note  Patient: Stacy Campbell  Procedure(s) Performed: CYSTOSCOPY WITH RETROGRADE PYELOGRAM/URETERAL STENT PLACEMENT (Left )     Patient location during evaluation: PACU Anesthesia Type: General Level of consciousness: awake and alert Pain management: pain level controlled Vital Signs Assessment: post-procedure vital signs reviewed and stable Respiratory status: spontaneous breathing, nonlabored ventilation and respiratory function stable Cardiovascular status: blood pressure returned to baseline and stable Postop Assessment: no apparent nausea or vomiting Anesthetic complications: no    Last Vitals:  Vitals:   01/29/17 2018 01/29/17 2025  BP: (!) 142/66 (!) 155/68  Pulse: 66 67  Resp: (!) 7 (!) 7  Temp:  36.7 C  SpO2: 98% 99%    Last Pain:  Vitals:   01/29/17 2025  TempSrc:   PainSc: Asleep                 Bernardino Dowell,W. EDMOND

## 2017-01-29 NOTE — Telephone Encounter (Signed)
I meant to make a note that I called patient to inform her that she did not need to come in for that visit.  Purpose of visit was to check for HPV and this always already reflexed due to pos ASCUS.    Thanks, Dr Rosalyn Gess

## 2017-01-29 NOTE — Consult Note (Signed)
Urology Consult Note    Requesting Attending Physician:  Dickie La, MD Service Requesting Consult:  Family Medicine  Service Providing Consult: Urology  Consulting Attending: Louis Meckel, MD   Reason for Consult:  Hydronephrosis   Stacy Campbell is seen in consultation for reasons noted above at the request of Dickie La, MD on the family medicine service.   This is a 62 y.o. yo patient with a history of afib s/p cardioversion, bone spurs and chronic back pain, asthma and HTN who presented to the ED with 3 days of left flank pain. She endorses stabbing pain that started all of a sudden 3 days ago, which worsened over last 3 days. Pain is covering left upper and lower quadrant, and radiates to left flank. Denies nausea vomiting fevers and chills. Denies LUTS: no gross hematuria, dysuria. Endorses some mild suprapubic pain. Denies rashes lower extremity swelling.   On admission, patient was HTN but vitals stable. Labs were notable for a clean UA, Cr elevated to 1.11 from baseline ~0.7, leukopenia with WBC 3.8. CT abdomen pelvis with contrast demonstrtaed decreased contrast enhancement of left renal parenchyma with hydroureteronephrosis to the level of the ureteral crossing with the left common iliac artery, where an abnormal soft tissue mass surrounding the artery and ureter is noted. There does not appear to be a focal mass. Multiple subcentimeter retroperitoneal lymph nodes are noted.    Past Medical History: Past Medical History:  Diagnosis Date  . Anxiety   . Arthritis   . Asthma   . Bone spur    spine  . Depression   . Eczema   . History of atrial fibrillation without current medication    cardioversion in 1990s  . Hypertension   . Insomnia     Past Surgical History:  History reviewed. No pertinent surgical history.  Medication: Current Facility-Administered Medications  Medication Dose Route Frequency Provider Last Rate Last Dose  . acetaminophen (TYLENOL)  tablet 650 mg  650 mg Oral Q6H PRN Bufford Lope, DO       Or  . acetaminophen (TYLENOL) suppository 650 mg  650 mg Rectal Q6H PRN Orson Eva J, DO      . albuterol (PROVENTIL HFA;VENTOLIN HFA) 108 (90 Base) MCG/ACT inhaler 2 puff  2 puff Inhalation Q6H PRN Bufford Lope, DO      . ALPRAZolam Duanne Moron) tablet 1 mg  1 mg Oral BID PRN Bufford Lope, DO      . amLODipine (NORVASC) tablet 10 mg  10 mg Oral Daily Yoo, Elsia J, DO      . ARIPiprazole (ABILIFY) tablet 5 mg  5 mg Oral Daily Yoo, Elsia J, DO      . carvedilol (COREG) tablet 25 mg  25 mg Oral BID WC Yoo, Elsia J, DO      . enoxaparin (LOVENOX) injection 40 mg  40 mg Subcutaneous Q24H Yoo, Elsia J, DO      . loratadine (CLARITIN) tablet 10 mg  10 mg Oral Daily PRN Shawna Orleans, Elsia J, DO      . montelukast (SINGULAIR) tablet 10 mg  10 mg Oral Daily PRN Orson Eva J, DO      . oxyCODONE-acetaminophen (PERCOCET/ROXICET) 5-325 MG per tablet 2 tablet  2 tablet Oral Q6H PRN Orson Eva J, DO      . polyethylene glycol (MIRALAX / GLYCOLAX) packet 17 g  17 g Oral Daily PRN Bufford Lope, DO      . promethazine (PHENERGAN)  tablet 25 mg  25 mg Oral Q8H PRN Bufford Lope, DO      . sertraline (ZOLOFT) tablet 150 mg  150 mg Oral Daily Yoo, Elsia J, DO      . zolpidem (AMBIEN) tablet 10 mg  10 mg Oral QHS PRN Bufford Lope, DO       Current Outpatient Medications  Medication Sig Dispense Refill  . albuterol (PROVENTIL HFA;VENTOLIN HFA) 108 (90 Base) MCG/ACT inhaler Inhale 2 puffs into the lungs every 6 (six) hours as needed for wheezing or shortness of breath. 1 Inhaler 2  . ALPRAZolam (XANAX) 1 MG tablet take 1 tablet by mouth twice a day if needed for anxiety 30 tablet 3  . amLODipine (NORVASC) 10 MG tablet take 1 tablet by mouth once daily 90 tablet 3  . ARIPiprazole (ABILIFY) 5 MG tablet Take 1 tablet (5 mg total) by mouth daily. Per Dr. Tammi Klippel.  One pill at night.  #30 with 11 refills. 30 tablet 10  . Aspirin-Caffeine (BC FAST PAIN RELIEF ARTHRITIS PO) Take  1-2 Packages by mouth daily as needed (pain).    . benzonatate (TESSALON) 100 MG capsule Take 1 capsule (100 mg total) by mouth 3 (three) times daily as needed for cough. 30 capsule 3  . carvedilol (COREG) 25 MG tablet Take 1 tablet (25 mg total) by mouth 2 (two) times daily with a meal. 180 tablet 3  . cetirizine (ZYRTEC) 10 MG tablet Take 1 tablet (10 mg total) by mouth daily. (Patient taking differently: Take 10 mg by mouth daily as needed for allergies. ) 30 tablet 0  . hydrochlorothiazide (HYDRODIURIL) 25 MG tablet Take 1 tablet (25 mg total) by mouth 2 (two) times daily. 90 tablet 3  . HYDROcodone-acetaminophen (NORCO/VICODIN) 5-325 MG tablet Take 1 tablet by mouth every 6 (six) hours as needed for moderate pain. 20 tablet 0  . meclizine (ANTIVERT) 25 MG tablet Take 1 tablet (25 mg total) by mouth 3 (three) times daily as needed for dizziness. 30 tablet 1  . montelukast (SINGULAIR) 10 MG tablet Take 1 tablet (10 mg total) by mouth at bedtime. (Patient taking differently: Take 10 mg by mouth daily as needed (allergies). ) 90 tablet 6  . oxyCODONE-acetaminophen (PERCOCET/ROXICET) 5-325 MG tablet Take 2 tablets by mouth 4 (four) times daily.    . promethazine (PHENERGAN) 25 MG tablet take 1 tablet by mouth every 8 hours if needed for nausea and vomiting 30 tablet 1  . sertraline (ZOLOFT) 100 MG tablet Take 1 tablet (100 mg total) by mouth daily. Target dose is 150 mg at night.  Per Dr. Tammi Klippel.  #45 with 11 refills. (Patient taking differently: Take 150 mg by mouth daily. ) 30 tablet 10  . triamcinolone ointment (KENALOG) 0.5 % Apply 1 application topically 2 (two) times daily. (Patient taking differently: Apply 1 application topically 2 (two) times daily as needed (rash). ) 30 g 0  . zolpidem (AMBIEN) 10 MG tablet Take 1 tablet (10 mg total) by mouth at bedtime as needed for sleep. 30 tablet 5    Allergies: Allergies  Allergen Reactions  . Hydrocodone Itching  . Aspirin Itching    Lower dose  doesn't make her itch (she takes it every day)  . Eggs Or Egg-Derived Products Nausea Only    No reaction if in another food    Social History: Social History   Tobacco Use  . Smoking status: Former Smoker    Types: Cigarettes  . Smokeless tobacco: Former  User    Quit date: 03/30/2008  Substance Use Topics  . Alcohol use: No  . Drug use: No    Family History Family History  Problem Relation Age of Onset  . Asthma Mother   . Diabetes Mother   . Asthma Father   . Diabetes Father   . Cancer Father        prostate  . Hepatitis B Daughter   . Breast cancer Neg Hx     Review of Systems 10 systems were reviewed and are negative except as noted specifically in the HPI.  Objective   Vital signs in last 24 hours: BP (!) 173/71   Pulse 73   Temp 98.4 F (36.9 C) (Oral)   Resp 16   Ht 5\' 5"  (1.651 m)   Wt 108 kg (238 lb)   SpO2 99%   BMI 39.61 kg/m   Intake/Output last 3 shifts: No intake/output data recorded.  Physical Exam General: NAD, A&O, resting, appropriate HEENT: [place]/AT, EOMI, MMM Pulmonary: Normal work of breathing on room air   Cardiovascular: HDS, adequate peripheral perfusion Abdomen: soft, tender to palpation on left upper and lower quadrants, no suprapubic fullness or tenderness GU: left CVA tenderness Extremities: warm and well perfused, no edema Neuro: Appropriate, no focal neurological deficits  Most Recent Labs: Lab Results  Component Value Date   WBC 3.8 (L) 01/29/2017   HGB 12.4 01/29/2017   HCT 39.5 01/29/2017   PLT 155 01/29/2017    Lab Results  Component Value Date   NA 139 01/29/2017   K 3.4 (L) 01/29/2017   CL 105 01/29/2017   CO2 23 01/29/2017   BUN 6 01/29/2017   CREATININE 1.11 (H) 01/29/2017   CALCIUM 9.1 01/29/2017   MG 1.6 01/21/2015    Lab Results  Component Value Date   ALKPHOS 84 01/29/2017   BILITOT 0.6 01/29/2017   BILIDIR 0.1 10/08/2011   PROT 7.7 01/29/2017   ALBUMIN 3.5 01/29/2017   ALT 38 01/29/2017    AST 43 (H) 01/29/2017    Lab Results  Component Value Date   INR 1.11 10/08/2011   APTT 34 08/15/2011     Urine Culture:None    IMAGING: Ct Abdomen Pelvis W Contrast  Result Date: 01/29/2017 CLINICAL DATA:  Abdominal pain EXAM: CT ABDOMEN AND PELVIS WITH CONTRAST TECHNIQUE: Multidetector CT imaging of the abdomen and pelvis was performed using the standard protocol following bolus administration of intravenous contrast. CONTRAST:  166mL ISOVUE-300 IOPAMIDOL (ISOVUE-300) INJECTION 61% COMPARISON:  Abdominal ultrasound 07/19/2016 FINDINGS: Lower chest: No pulmonary nodules or pleural effusion. No visible pericardial effusion. Hepatobiliary: Normal hepatic contours and density. No visible biliary dilatation. Normal gallbladder. Pancreas: Normal contours without ductal dilatation. No peripancreatic fluid collection. Spleen: There is splenomegaly measuring 16 cm in craniocaudal dimension. Adrenals/Urinary Tract: --Adrenal glands: Normal. --Right kidney/ureter: There are 2 upper pole nonobstructive calculi measuring 1-2 mm each. No hydronephrosis. The right ureter is unobstructed. --Left kidney/ureter: There is decreased contrast enhancement of the left renal parenchyma. There is moderate hydroureteronephrosis. Hydroureter is proximal to the point at which the ureter crosses over the left common iliac artery. At the center section, there is abnormal soft tissue surrounding the artery and ureter, measuring approximately 3.4 x 2.8 cm. --Urinary bladder: Unremarkable. Stomach/Bowel: --Stomach/Duodenum: No hiatal hernia or other gastric abnormality. Normal duodenal course and caliber. --Small bowel: No dilatation or inflammation. --Colon: No focal abnormality. --Appendix: Normal. Vascular/Lymphatic: There is inflammatory change and soft tissue density material surrounding the left common iliac  artery, which remains patent. Margins of the abnormal density are somewhat indistinct. This does not appear to be a  focal mass. There is atherosclerotic calcification of the non aneurysmal abdominal aorta. There are multiple subcentimeter retroperitoneal lymph nodes but none that are enlarged by CT size criteria. Reproductive: Normal uterus and ovaries. Musculoskeletal. No bony spinal canal stenosis or focal osseous abnormality. Other: None. IMPRESSION: 1. Moderate inflammatory change surrounding the left common iliac artery. The margins are indistinct and are not particularly masslike. This may indicate a focal vasculitis. An isolated area of retroperitoneal fibrosis is a less likely possibility. 2. Moderate left hydroureteronephrosis and decreased contrast enhancement of the left kidney. The left ureter is at least partially obstructed at its intersection with the left common iliac artery. This may be reactive secondary to the adjacent inflammation. 3.  Aortic Atherosclerosis (ICD10-I70.0). Electronically Signed   By: Ulyses Jarred M.D.   On: 01/29/2017 04:43    ------  Assessment:  Patient is a 62 y.o. female with no history of vasculitis who presented to the ED with 3 days of left flank pain, found to have left hydroureteronephrosis to the level of a soft tissue inflammation/mass circumscribing the left mid ureter and left common iliac artery. This inflammatory mass is likely causing extrinsic compression of the ureter and thus the hydroureteroephrosis.   Patient with mild AKI, with Cr 1.1. No evidence of infection at this time. However, given left flank pain symptoms and likely timeline of diagnosis and treatment of this mass, will plan to take patient to OR for collecting system decompression to alleviate symptoms.   Discussed options with patient, including watchful waiting, left ureteral stent placement and left percutaneous nephrostomy tube placement. Patient does prefer intervention, as her left flank pain is significant at this time. She prefers to trial ureteral stent placement first. Explicitly, the risk  of the ureteral stent not improving symptoms due to discomfort or inability to decompress the collecting system were discussed. Low but present and life threateneing risk of arterial-ureteral fistula was discussed. Risk of inability to place stent and need for percutaneous nephrostomy tube was discussed. Patient understands, and agrees to proceed.   Recommendations: 1. NPO for OR tonight  2. Posted, consented for left ureteral stent placement, left retrograde pyelogram, cystourethroscopy, possible left ureteroscopy   3. 2g cefazolin on call to OR for surgical prophylaxis  4. Will defer workup (biopsy versus treatment with reimaging) of this inflammatory mass to vascular surgery  Thank you for this consult. Please contact the urology consult pager with any further questions/concerns.  Jonna Clark, MD Urology Surgical Resident  -----

## 2017-01-29 NOTE — Consult Note (Signed)
Hospital Consult    Reason for Consult:  Possible vasculitis Referring Physician:  ED MRN #:  [id number]  History of Present Illness: This is a 62 y.o. female with history or htn and formerly a smoker. She presents with 3 day history of left flank and back pain. She does not have any gi complaints. Denies fevers or chills. Has not had any urinary issues. Denies ever having pain similar previously. Has had some relief with pain meds in the ed. She denies history of stroke or coronary issues. She has dermatitis but no previous personal or family history of rheumatologic issues.  Past Medical History:  Diagnosis Date  . Anxiety   . Arthritis   . Asthma   . Bone spur    spine  . Depression   . Eczema   . Hypertension   . Insomnia     History reviewed. No pertinent surgical history.  Allergies  Allergen Reactions  . Hydrocodone Itching  . Aspirin Itching    Lower dose doesn't make her itch (she takes it every day)  . Eggs Or Egg-Derived Products Nausea Only    No reaction if in another food    Prior to Admission medications   Medication Sig Start Date End Date Taking? Authorizing Provider  albuterol (PROVENTIL HFA;VENTOLIN HFA) 108 (90 Base) MCG/ACT inhaler Inhale 2 puffs into the lungs every 6 (six) hours as needed for wheezing or shortness of breath. 10/31/15  Yes Rumley, [place], DO  ALPRAZolam Duanne Moron) 1 MG tablet take 1 tablet by mouth twice a day if needed for anxiety 01/14/17  Yes Eloise Levels, MD  amLODipine (NORVASC) 10 MG tablet take 1 tablet by mouth once daily 06/19/16  Yes Rumley, [place] N, DO  ARIPiprazole (ABILIFY) 5 MG tablet Take 1 tablet (5 mg total) by mouth daily. Per Dr. Tammi Klippel.  One pill at night.  #30 with 11 refills. 06/28/16 05/28/17 Yes Rumley, [place] N, DO  Aspirin-Caffeine (BC FAST PAIN RELIEF ARTHRITIS PO) Take 1-2 Packages by mouth daily as needed (pain).   Yes [provider]  benzonatate (TESSALON) 100 MG capsule Take 1 capsule (100 mg  total) by mouth 3 (three) times daily as needed for cough. 01/14/17  Yes Eloise Levels, MD  carvedilol (COREG) 25 MG tablet Take 1 tablet (25 mg total) by mouth 2 (two) times daily with a meal. 11/27/16  Yes Eloise Levels, MD  cetirizine (ZYRTEC) 10 MG tablet Take 1 tablet (10 mg total) by mouth daily. Patient taking differently: Take 10 mg by mouth daily as needed for allergies.  07/07/15  Yes Rumley, [place] N, DO  hydrochlorothiazide (HYDRODIURIL) 25 MG tablet Take 1 tablet (25 mg total) by mouth 2 (two) times daily. 07/07/15  Yes Rumley, [place] N, DO  HYDROcodone-acetaminophen (NORCO/VICODIN) 5-325 MG tablet Take 1 tablet by mouth every 6 (six) hours as needed for moderate pain. 05/31/16  Yes Rumley, [place] N, DO  meclizine (ANTIVERT) 25 MG tablet Take 1 tablet (25 mg total) by mouth 3 (three) times daily as needed for dizziness. 05/24/14  Yes Cook, Jayce G, DO  montelukast (SINGULAIR) 10 MG tablet Take 1 tablet (10 mg total) by mouth at bedtime. Patient taking differently: Take 10 mg by mouth daily as needed (allergies).  05/16/15  Yes Rumley, [place] N, DO  oxyCODONE-acetaminophen (PERCOCET/ROXICET) 5-325 MG tablet Take 2 tablets by mouth 4 (four) times daily.   Yes [provider]  promethazine (PHENERGAN) 25 MG tablet take 1 tablet by mouth every  8 hours if needed for nausea and vomiting 01/14/17  Yes Eloise Levels, MD  sertraline (ZOLOFT) 100 MG tablet Take 1 tablet (100 mg total) by mouth daily. Target dose is 150 mg at night.  Per Dr. Tammi Klippel.  #45 with 11 refills. Patient taking differently: Take 150 mg by mouth daily.  06/28/16 05/28/17 Yes Rumley, [place] N, DO  triamcinolone ointment (KENALOG) 0.5 % Apply 1 application topically 2 (two) times daily. Patient taking differently: Apply 1 application topically 2 (two) times daily as needed (rash).  01/14/13  Yes Cook, Jayce G, DO  zolpidem (AMBIEN) 10 MG tablet Take 1 tablet (10 mg total) by mouth at bedtime as needed for sleep.  01/14/17  Yes Eloise Levels, MD    Social History   Socioeconomic History  . Marital status: Single    Spouse name: Not on file  . Number of children: Not on file  . Years of education: Not on file  . Highest education level: Not on file  Social Needs  . Financial resource strain: Not on file  . Food insecurity - worry: Not on file  . Food insecurity - inability: Not on file  . Transportation needs - medical: Not on file  . Transportation needs - non-medical: Not on file  Occupational History  . Not on file  Tobacco Use  . Smoking status: Former Smoker    Types: Cigarettes  . Smokeless tobacco: Former Systems developer    Quit date: 03/30/2008  Substance and Sexual Activity  . Alcohol use: No  . Drug use: No  . Sexual activity: Not on file  Other Topics Concern  . Not on file  Social History Narrative  . Not on file     Family History  Problem Relation Age of Onset  . Asthma Mother   . Diabetes Mother   . Asthma Father   . Diabetes Father   . Cancer Father   . Breast cancer Neg Hx     REVIEW OF SYSTEMS (negative unless checked):   Cardiac:  []  Chest pain or chest pressure? []  Shortness of breath upon activity? []  Shortness of breath when lying flat? []  Irregular heart rhythm?  Vascular:  []  Pain in calf, thigh, or hip brought on by walking? []  Pain in feet at night that wakes you up from your sleep? []  Blood clot in your veins? []  Leg swelling?  Pulmonary:  []  Oxygen at home? []  Productive cough? []  Wheezing?  Neurologic:  []  Sudden weakness in arms or legs? []  Sudden numbness in arms or legs? []  Sudden onset of difficult speaking or slurred speech? []  Temporary loss of vision in one eye? []  Problems with dizziness?  Gastrointestinal:  []  Blood in stool? [x]  abdominal pain?  Genitourinary:  []  Burning when urinating? []  Blood in urine?  Psychiatric:  []  Major depression  Hematologic:  []  Bleeding problems? []  Problems with blood  clotting?  Dermatologic:  []  Rashes or ulcers?  Constitutional:  []  Fever or chills?  Ear/Nose/Throat:  []  Change in hearing? []  Nose bleeds? []  Sore throat?  Musculoskeletal:  [x]  Back pain? []  Joint pain? []  Muscle pain?    Physical Examination  Vitals:   01/29/17 0400 01/29/17 0632  BP: (!) 175/75 (!) 189/84  Pulse: 88   Resp:    Temp:    SpO2: 100%    Body mass index is 39.61 kg/m.  General:  WDWN in NAD Gait: Not observed HENT: WNL, normocephalic Pulmonary: normal non-labored breathing Cardiac: palpable radial,  carotid, femoral, dp/pt pulses bilaterally Abdomen: soft, mild ttp in llq Skin: systemic dermatitis Musculoskeletal: no muscle wasting or atrophy  Neurologic: A&O X 3; SENSATION: normal; MOTOR FUNCTION:  moving all extremities equally. Speech is fluent/normal Psychiatric:  Appropriate mood and affect  CBC    Component Value Date/Time   WBC 3.8 (L) 01/29/2017 0019   RBC 5.16 (H) 01/29/2017 0019   HGB 12.4 01/29/2017 0019   HCT 39.5 01/29/2017 0019   PLT 155 01/29/2017 0019   MCV 76.6 (L) 01/29/2017 0019   MCH 24.0 (L) 01/29/2017 0019   MCHC 31.4 01/29/2017 0019   RDW 15.6 (H) 01/29/2017 0019   LYMPHSABS 795 (L) 02/08/2016 1523   MONOABS 424 02/08/2016 1523   EOSABS 159 02/08/2016 1523   BASOSABS 0 02/08/2016 1523    BMET    Component Value Date/Time   NA 139 01/29/2017 0019   K 3.4 (L) 01/29/2017 0019   CL 105 01/29/2017 0019   CO2 23 01/29/2017 0019   GLUCOSE 116 (H) 01/29/2017 0019   BUN 6 01/29/2017 0019   CREATININE 1.11 (H) 01/29/2017 0019   CREATININE 0.91 02/08/2016 1523   CALCIUM 9.1 01/29/2017 0019   GFRNONAA 52 (L) 01/29/2017 0019   GFRNONAA 69 02/08/2016 1523   GFRAA >60 01/29/2017 0019   GFRAA 79 02/08/2016 1523    COAGS: Lab Results  Component Value Date   INR 1.11 10/08/2011   INR 1.08 08/15/2011   INR 1.10 07/26/2011     Non-Invasive Vascular Imaging:   IMPRESSION: 1. Moderate inflammatory change  surrounding the left common iliac artery. The margins are indistinct and are not particularly masslike. This may indicate a focal vasculitis. An isolated area of retroperitoneal fibrosis is a less likely possibility. 2. Moderate left hydroureteronephrosis and decreased contrast enhancement of the left kidney. The left ureter is at least partially obstructed at its intersection with the left common iliac artery. This may be reactive secondary to the adjacent inflammation. 3.  Aortic Atherosclerosis (ICD10-I70.0).    ASSESSMENT/PLAN: This is a 62 y.o. female with 3 day history of abdominal and flank pain found to have inflammatory reaction surrounding left common iliac artery with associated involvement of left ureter. Merits urology evaluation as well rheumatologic workup with inflammatory markers. She does not need surgical intervention at this time but will need repeat imaging with CT angio in the future for further evaluation. Will be available as needed.   Farrin Shadle C. Donzetta Matters, MD [facility] Office: [phone] Pager: [phone]

## 2017-01-29 NOTE — Anesthesia Procedure Notes (Signed)
Procedure Name: LMA Insertion Date/Time: 01/29/2017 7:39 PM Performed by: Oletta Lamas, CRNA Pre-anesthesia Checklist: Patient identified, Emergency Drugs available, Suction available and Patient being monitored Patient Re-evaluated:Patient Re-evaluated prior to induction Oxygen Delivery Method: Circle System Utilized Preoxygenation: Pre-oxygenation with 100% oxygen Induction Type: IV induction Ventilation: Mask ventilation without difficulty LMA: LMA inserted LMA Size: 5.0 Number of attempts: 1 Placement Confirmation: positive ETCO2 Tube secured with: Tape Dental Injury: Teeth and Oropharynx as per pre-operative assessment

## 2017-01-29 NOTE — Telephone Encounter (Signed)
Pt no showed appt on 01/25/17.  Attempted to call, lm on vm for callback. Kendal Raffo, Salome Spotted, CMA

## 2017-01-29 NOTE — ED Notes (Signed)
Patient transported to CT 

## 2017-01-29 NOTE — ED Triage Notes (Signed)
Pt reports R sided abdominal pain since Friday, denies N/V/D. States intermittently csharp and pinching in nature, has been taking percocet at home to manage pain - last dose approx 45 minutes ago.

## 2017-01-29 NOTE — ED Provider Notes (Addendum)
[facility] Provider Note   CSN: [id number] Arrival date & time: 01/29/17  0003     History   Chief Complaint Chief Complaint  Patient presents with  . Abdominal Pain    HPI MARONDA CAISON is a 62 y.o. female.  HPI  This is a 62 year old female with a history of hypertension, chronic pain who presents with left-sided abdominal pain.  Patient reports 3-day history of worsening left-sided abdominal and flank pain.  It is nonradiating.  She reports that it is sharp.  It is currently 10 out of 10.  She took Percocet at home with no relief.  She denies any nausea, vomiting, diarrhea.  She reports normal bowel movement 2 days ago.  Nothing seems to make the pain better or worse.  She reports no urinary symptoms or hematuria.  She has never had pain like this in the past.  Past Medical History:  Diagnosis Date  . Anxiety   . Arthritis   . Asthma   . Bone spur    spine  . Depression   . Eczema   . Hypertension   . Insomnia     Patient Active Problem List   Diagnosis Date Noted  . Complex regional pain syndrome type I 04/08/2016  . Piriformis syndrome of left side 07/10/2015  . Psoriasis 07/10/2015  . Leg cramps 10/17/2014  . Health care maintenance 10/17/2014  . Knee pain, bilateral 10/08/2013  . Asthma with COPD ([place]) 05/25/2013  . Allergic rhinitis 10/12/2012  . Hepatitis C 03/06/2011  . 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 Anxiety state 07/30/2007  . Obesity, Class II, BMI 35-39.9 03/07/2006  . Depression 03/07/2006  . HYPERTENSION, BENIGN SYSTEMIC 03/07/2006  . Chronic low back pain 03/07/2006  . Insomnia 03/07/2006    History reviewed. No pertinent surgical history.  OB History    No data available       Home Medications    Prior to Admission medications   Medication Sig Start Date End Date Taking? Authorizing Provider  albuterol (PROVENTIL HFA;VENTOLIN HFA) 108 (90 Base) MCG/ACT inhaler Inhale 2 puffs into the  lungs every 6 (six) hours as needed for wheezing or shortness of breath. 10/31/15  Yes Rumley, [place], DO  ALPRAZolam Duanne Moron) 1 MG tablet take 1 tablet by mouth twice a day if needed for anxiety 01/14/17  Yes Eloise Levels, MD  amLODipine (NORVASC) 10 MG tablet take 1 tablet by mouth once daily 06/19/16  Yes Rumley, [place] N, DO  ARIPiprazole (ABILIFY) 5 MG tablet Take 1 tablet (5 mg total) by mouth daily. Per Dr. Tammi Klippel.  One pill at night.  #30 with 11 refills. 06/28/16 05/28/17 Yes Rumley, [place] N, DO  Aspirin-Caffeine (BC FAST PAIN RELIEF ARTHRITIS PO) Take 1-2 Packages by mouth daily as needed (pain).   Yes [provider]  benzonatate (TESSALON) 100 MG capsule Take 1 capsule (100 mg total) by mouth 3 (three) times daily as needed for cough. 01/14/17  Yes Eloise Levels, MD  carvedilol (COREG) 25 MG tablet Take 1 tablet (25 mg total) by mouth 2 (two) times daily with a meal. 11/27/16  Yes Eloise Levels, MD  cetirizine (ZYRTEC) 10 MG tablet Take 1 tablet (10 mg total) by mouth daily. Patient taking differently: Take 10 mg by mouth daily as needed for allergies.  07/07/15  Yes Rumley, [place] N, DO  hydrochlorothiazide (HYDRODIURIL) 25 MG tablet Take 1 tablet (25 mg total) by mouth 2 (two) times daily. 07/07/15  Yes Rumley, [place] N, DO  HYDROcodone-acetaminophen (NORCO/VICODIN) 5-325 MG tablet Take 1 tablet by mouth every 6 (six) hours as needed for moderate pain. 05/31/16  Yes Rumley, [place] N, DO  meclizine (ANTIVERT) 25 MG tablet Take 1 tablet (25 mg total) by mouth 3 (three) times daily as needed for dizziness. 05/24/14  Yes Cook, Jayce G, DO  montelukast (SINGULAIR) 10 MG tablet Take 1 tablet (10 mg total) by mouth at bedtime. Patient taking differently: Take 10 mg by mouth daily as needed (allergies).  05/16/15  Yes Rumley, [place] N, DO  oxyCODONE-acetaminophen (PERCOCET/ROXICET) 5-325 MG tablet Take 2 tablets by mouth 4 (four) times daily.   Yes [provider]    promethazine (PHENERGAN) 25 MG tablet take 1 tablet by mouth every 8 hours if needed for nausea and vomiting 01/14/17  Yes Eloise Levels, MD  sertraline (ZOLOFT) 100 MG tablet Take 1 tablet (100 mg total) by mouth daily. Target dose is 150 mg at night.  Per Dr. Tammi Klippel.  #45 with 11 refills. Patient taking differently: Take 150 mg by mouth daily.  06/28/16 05/28/17 Yes Rumley, [place] N, DO  triamcinolone ointment (KENALOG) 0.5 % Apply 1 application topically 2 (two) times daily. Patient taking differently: Apply 1 application topically 2 (two) times daily as needed (rash).  01/14/13  Yes Cook, Jayce G, DO  zolpidem (AMBIEN) 10 MG tablet Take 1 tablet (10 mg total) by mouth at bedtime as needed for sleep. 01/14/17  Yes Eloise Levels, MD    Family History Family History  Problem Relation Age of Onset  . Asthma Mother   . Diabetes Mother   . Asthma Father   . Diabetes Father   . Cancer Father   . Breast cancer Neg Hx     Social History Social History   Tobacco Use  . Smoking status: Former Smoker    Types: Cigarettes  . Smokeless tobacco: Former Systems developer    Quit date: 03/30/2008  Substance Use Topics  . Alcohol use: No  . Drug use: No     Allergies   Hydrocodone; Aspirin; and Eggs or egg-derived products   Review of Systems Review of Systems  Constitutional: Negative for fever.  Respiratory: Negative for shortness of breath.   Cardiovascular: Negative for chest pain.  Gastrointestinal: Positive for abdominal pain. Negative for constipation, diarrhea, nausea and vomiting.  Genitourinary: Positive for flank pain. Negative for dysuria and hematuria.  All other systems reviewed and are negative.    Physical Exam Updated Vital Signs BP (!) 169/74   Pulse 71   Temp 98.4 F (36.9 C) (Oral)   Resp 16   Ht 5\' 5"  (1.651 m)   Wt 108 kg (238 lb)   SpO2 99%   BMI 39.61 kg/m   Physical Exam  Constitutional: She is oriented to person, place, and time. She appears  well-developed and well-nourished. She does not appear ill.  Overweight  HENT:  Head: Normocephalic and atraumatic.  Neck: Neck supple.  Cardiovascular: Normal rate, regular rhythm and normal heart sounds.  Pulmonary/Chest: Effort normal. No respiratory distress. She has no wheezes.  Abdominal: Soft. Normal appearance and bowel sounds are normal. There is tenderness in the left upper quadrant. There is no rebound and no guarding.  Genitourinary:  Genitourinary Comments: No CVA tenderness  Neurological: She is alert and oriented to person, place, and time.  Skin: Skin is warm and dry.  Psychiatric: She has a normal mood and affect.  Nursing note and vitals reviewed.  ED Treatments / Results  Labs (all labs ordered are listed, but only abnormal results are displayed) Labs Reviewed  COMPREHENSIVE METABOLIC PANEL - Abnormal; Notable for the following components:      Result Value   Potassium 3.4 (*)    Glucose, Bld 116 (*)    Creatinine, Ser 1.11 (*)    AST 43 (*)    GFR calc non Af Amer 52 (*)    All other components within normal limits  CBC - Abnormal; Notable for the following components:   WBC 3.8 (*)    RBC 5.16 (*)    MCV 76.6 (*)    MCH 24.0 (*)    RDW 15.6 (*)    All other components within normal limits  URINALYSIS, ROUTINE W REFLEX MICROSCOPIC - Abnormal; Notable for the following components:   Leukocytes, UA TRACE (*)    Squamous Epithelial / LPF 0-5 (*)    All other components within normal limits  LIPASE, BLOOD  SEDIMENTATION RATE  C-REACTIVE PROTEIN    EKG  EKG Interpretation None       Radiology Ct Abdomen Pelvis W Contrast  Result Date: 01/29/2017 CLINICAL DATA:  Abdominal pain EXAM: CT ABDOMEN AND PELVIS WITH CONTRAST TECHNIQUE: Multidetector CT imaging of the abdomen and pelvis was performed using the standard protocol following bolus administration of intravenous contrast. CONTRAST:  165mL ISOVUE-300 IOPAMIDOL (ISOVUE-300) INJECTION 61%  COMPARISON:  Abdominal ultrasound 07/19/2016 FINDINGS: Lower chest: No pulmonary nodules or pleural effusion. No visible pericardial effusion. Hepatobiliary: Normal hepatic contours and density. No visible biliary dilatation. Normal gallbladder. Pancreas: Normal contours without ductal dilatation. No peripancreatic fluid collection. Spleen: There is splenomegaly measuring 16 cm in craniocaudal dimension. Adrenals/Urinary Tract: --Adrenal glands: Normal. --Right kidney/ureter: There are 2 upper pole nonobstructive calculi measuring 1-2 mm each. No hydronephrosis. The right ureter is unobstructed. --Left kidney/ureter: There is decreased contrast enhancement of the left renal parenchyma. There is moderate hydroureteronephrosis. Hydroureter is proximal to the point at which the ureter crosses over the left common iliac artery. At the center section, there is abnormal soft tissue surrounding the artery and ureter, measuring approximately 3.4 x 2.8 cm. --Urinary bladder: Unremarkable. Stomach/Bowel: --Stomach/Duodenum: No hiatal hernia or other gastric abnormality. Normal duodenal course and caliber. --Small bowel: No dilatation or inflammation. --Colon: No focal abnormality. --Appendix: Normal. Vascular/Lymphatic: There is inflammatory change and soft tissue density material surrounding the left common iliac artery, which remains patent. Margins of the abnormal density are somewhat indistinct. This does not appear to be a focal mass. There is atherosclerotic calcification of the non aneurysmal abdominal aorta. There are multiple subcentimeter retroperitoneal lymph nodes but none that are enlarged by CT size criteria. Reproductive: Normal uterus and ovaries. Musculoskeletal. No bony spinal canal stenosis or focal osseous abnormality. Other: None. IMPRESSION: 1. Moderate inflammatory change surrounding the left common iliac artery. The margins are indistinct and are not particularly masslike. This may indicate a focal  vasculitis. An isolated area of retroperitoneal fibrosis is a less likely possibility. 2. Moderate left hydroureteronephrosis and decreased contrast enhancement of the left kidney. The left ureter is at least partially obstructed at its intersection with the left common iliac artery. This may be reactive secondary to the adjacent inflammation. 3.  Aortic Atherosclerosis (ICD10-I70.0). Electronically Signed   By: Ulyses Jarred M.D.   On: 01/29/2017 04:43    Procedures Procedures (including critical care time)  Medications Ordered in ED Medications  morphine 4 MG/ML injection 4 mg (4 mg Intravenous Given 01/29/17 0355)  ondansetron (ZOFRAN) injection 4 mg (4 mg Intravenous Given 01/29/17 0355)  iopamidol (ISOVUE-300) 61 % injection (100 mLs  Contrast Given 01/29/17 0410)  morphine 4 MG/ML injection 4 mg (4 mg Intravenous Given 01/29/17 3007)     Initial Impression / Assessment and Plan / ED Course  I have reviewed the triage vital signs and the nursing notes.  Pertinent labs & imaging results that were available during my care of the patient were reviewed by me and considered in my medical decision making (see chart for details).     Patient presents with left upper quadrant pain.  No significant other associated symptoms.  She is tender on exam without signs of peritonitis.  Considerations include diverticulitis, atypical pyelonephritis, splenic pathology.  Patient was given pain and nausea medication.  Lab work obtained as well as CT scan.  CT scan shows haziness around the left iliac artery of unknown significance; however, it does appear to be causing some level of obstruction of her left ureter.  I discussed with radiology whether further imaging would be helpful.  They stated that the artery appeared patent and they do not recommend CTA.  This was discussed with vascular surgery.  Dr. Donzetta Matters will review the imaging and evaluate the patient.  8:00 AM Discussed with Dr. Donzetta Matters.  He has reviewed  the CT scan.  He does not feel that this is a primary vascular issue.  He feels she likely needs a rheumatologic workup as well as urology consult for obstructive uropathy.  I appreciate his consult.  She is a family practice patient.  We will send a sed rate, CRP, and defer further workup to family practice.  Final Clinical Impressions(s) / ED Diagnoses   Final diagnoses:  LUQ pain  Obstructive nephropathy    ED Discharge Orders    None       Erie Radu, Barbette Hair, MD 01/29/17 0800    Merryl Hacker, MD 01/29/17 2356

## 2017-01-29 NOTE — ED Notes (Signed)
Pt ambulatory to restroom with steady gait.

## 2017-01-29 NOTE — H&P (Signed)
[facility] Admission History and Physical Service Pager: [phone]  Patient name: Stacy Campbell Medical record number: [id number] Date of birth: [birth year] Age: 62 y.o. Gender: female  Primary Care Provider: Eloise Levels, MD Consultants: [facility], urology Code Status: Full  Chief Complaint: L flank pain  Assessment and Plan: Stacy Campbell is a 62 y.o. female presenting with L flank pain . PMH is significant for HTN, h/o atrial fibrillation with cardioversion in 1990s, asthma, chronic back pain, treated hepatitis C, psoriasis, anxiety, depression.  L flank pain and abdominal pain. CT abdomen showing inflammatory change of L common iliac artery concerning for focal vasculitis with L hydroureteronephrosis and at least partial obstruction at L ureter and L iliac artery intersection. Patient does have some risk factors for vasculitis given she is a former smoker and her history of psoriasis and possible psoriatic arthritis given chronic low back pain. CRP elevated at 1.5 and ESR pending. CT imaging also showed splenomegaly. Vascular surgery consulted in ED recommending rheumatologic workup and urology consult for obstructive uropathy. Radiology not recommending CTA at this time. WBC low at 3.8 and Cr elevated at 1.11 from baseline 0.7-0.9 likely from hydroureteronephrosis. She has 2+ DP pulses in her bilateral LE which is reassuring.  - admit to medsurg, attending Dr. Nori Riis - vitals per floor - ESR, ANA, complement, ANCA pending - urology consult, appreciate recommendations - consider CTA or MRA imaging - monitor electrolytes and SCr - avoid nephrotoxic medications - consider prednisone 94m qd for immunosuppression pending bloodwork - continue home percocet [id number] pain  HTN, chronic. BP elevated 163/77 on admit but patient last took her medication 2 days ago. - monitor BP - continue home norvasc 1084md, coreg 26m20mD - hold home HCTZ for  AKI and obstructive uropathy  Heart murmur. Patient does have h/o atrial fibrillation s/p cardioversion remotely in the past. Patient is asymptomatic and appears euvolemic but with systolic murmur on exam. - EKG ordered - Echo - vitals per floor  Asthma with COPD. Stable. - continue home singular - Albuterol 2puffs q6prn  Anxiety/Depression with insomnia. Stable.  - continue home zoloft, abilify, xanax 1mg 20m prn, ambien 10mg 72mprn   FEN/GI: heart healthy diet Prophylaxis: lovenox  Disposition: admit to medsurg  History of Present Illness:  Stacy D CovingSelman61 y.o19female presenting with L flank pain.  Patient reports 3 day history of L sided flank pain and abdominal pain. States her pain is constant, sharp stabbing pain, non radiating, rates pain 10/10, worse with stepping down or sitting down hard or any jarring movements. She tried her home percocet without relief and also tried having BM without relief. She denies fever/chills, nausea/vomiting, dysuria, hematuria, urinary urgency or frequency. She states that she does have arthritis on chronic opiates as well as psoriasis dermatitis for which [place] was being treated with injectable medications.   Review Of Systems: Per HPI with the following additions:   Review of Systems  Constitutional: Negative for chills, diaphoresis, fever and weight loss.  Eyes:       States her eyes are a little red currently from crying earlier  Respiratory: Negative for cough, shortness of breath and wheezing.   Cardiovascular: Negative for chest pain, palpitations and leg swelling.  Gastrointestinal: Positive for abdominal pain. Negative for blood in stool, constipation, diarrhea, heartburn, nausea and vomiting.  Genitourinary: Positive for flank pain. Negative for dysuria, frequency, hematuria and urgency.  Musculoskeletal:  Positive for back pain. Negative for myalgias.  Skin: Positive for rash  (chronic). Negative for itching.  Neurological: Negative for dizziness.  Endo/Heme/Allergies: Does not bruise/bleed easily.     Patient Active Problem List   Diagnosis Date Noted  . Abdominal pain 01/29/2017  . Complex regional pain syndrome type I 04/08/2016  . Piriformis syndrome of left side 07/10/2015  . Psoriasis 07/10/2015  . Leg cramps 10/17/2014  . Health care maintenance 10/17/2014  . Knee pain, bilateral 10/08/2013  . Asthma with COPD ([place]) 05/25/2013  . Allergic rhinitis 10/12/2012  . Hepatitis C 03/06/2011  . 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 Anxiety state 07/30/2007  . Obesity, Class II, BMI 35-39.9 03/07/2006  . Depression 03/07/2006  . HYPERTENSION, BENIGN SYSTEMIC 03/07/2006  . Chronic low back pain 03/07/2006  . Insomnia 03/07/2006    Past Medical History: Past Medical History:  Diagnosis Date  . Anxiety   . Arthritis   . Asthma   . Bone spur    spine  . Depression   . Eczema   . History of atrial fibrillation without current medication    cardioversion in 1990s  . Hypertension   . Insomnia     Past Surgical History: History reviewed. No pertinent surgical history.  Social History: Social History   Tobacco Use  . Smoking status: Former Smoker    Types: Cigarettes  . Smokeless tobacco: Former Systems developer    Quit date: 03/30/2008  Substance Use Topics  . Alcohol use: No  . Drug use: No   Additional social history: Lives at home with brother and grandnephew, 2 grandchildren.  Please also refer to relevant sections of EMR.  Family History: Family History  Problem Relation Age of Onset  . Asthma Mother   . Diabetes Mother   . Asthma Father   . Diabetes Father   . Cancer Father        prostate  . Hepatitis B Daughter   . Breast cancer Neg Hx     Allergies and Medications: Allergies  Allergen Reactions  . Hydrocodone Itching  . Aspirin Itching    Lower dose doesn't make her itch (she takes it every day)  . Eggs Or Egg-Derived Products  Nausea Only    No reaction if in another food   No current facility-administered medications on file prior to encounter.    Current Outpatient Medications on File Prior to Encounter  Medication Sig Dispense Refill  . albuterol (PROVENTIL HFA;VENTOLIN HFA) 108 (90 Base) MCG/ACT inhaler Inhale 2 puffs into the lungs every 6 (six) hours as needed for wheezing or shortness of breath. 1 Inhaler 2  . ALPRAZolam (XANAX) 1 MG tablet take 1 tablet by mouth twice a day if needed for anxiety 30 tablet 3  . amLODipine (NORVASC) 10 MG tablet take 1 tablet by mouth once daily 90 tablet 3  . ARIPiprazole (ABILIFY) 5 MG tablet Take 1 tablet (5 mg total) by mouth daily. Per Dr. Tammi Klippel.  One pill at night.  #30 with 11 refills. 30 tablet 10  . Aspirin-Caffeine (BC FAST PAIN RELIEF ARTHRITIS PO) Take 1-2 Packages by mouth daily as needed (pain).    . benzonatate (TESSALON) 100 MG capsule Take 1 capsule (100 mg total) by mouth 3 (three) times daily as needed for cough. 30 capsule 3  . carvedilol (COREG) 25 MG tablet Take 1 tablet (25 mg total) by mouth 2 (two) times daily with a meal. 180 tablet 3  . cetirizine (ZYRTEC) 10 MG tablet Take 1  tablet (10 mg total) by mouth daily. (Patient taking differently: Take 10 mg by mouth daily as needed for allergies. ) 30 tablet 0  . hydrochlorothiazide (HYDRODIURIL) 25 MG tablet Take 1 tablet (25 mg total) by mouth 2 (two) times daily. 90 tablet 3  . HYDROcodone-acetaminophen (NORCO/VICODIN) 5-325 MG tablet Take 1 tablet by mouth every 6 (six) hours as needed for moderate pain. 20 tablet 0  . meclizine (ANTIVERT) 25 MG tablet Take 1 tablet (25 mg total) by mouth 3 (three) times daily as needed for dizziness. 30 tablet 1  . montelukast (SINGULAIR) 10 MG tablet Take 1 tablet (10 mg total) by mouth at bedtime. (Patient taking differently: Take 10 mg by mouth daily as needed (allergies). ) 90 tablet 6  . oxyCODONE-acetaminophen (PERCOCET/ROXICET) 5-325 MG tablet Take 2 tablets by  mouth 4 (four) times daily.    . promethazine (PHENERGAN) 25 MG tablet take 1 tablet by mouth every 8 hours if needed for nausea and vomiting 30 tablet 1  . sertraline (ZOLOFT) 100 MG tablet Take 1 tablet (100 mg total) by mouth daily. Target dose is 150 mg at night.  Per Dr. Tammi Klippel.  #45 with 11 refills. (Patient taking differently: Take 150 mg by mouth daily. ) 30 tablet 10  . triamcinolone ointment (KENALOG) 0.5 % Apply 1 application topically 2 (two) times daily. (Patient taking differently: Apply 1 application topically 2 (two) times daily as needed (rash). ) 30 g 0  . zolpidem (AMBIEN) 10 MG tablet Take 1 tablet (10 mg total) by mouth at bedtime as needed for sleep. 30 tablet 5    Objective: BP (!) 169/74   Pulse 71   Temp 98.4 F (36.9 C) (Oral)   Resp 16   Ht 5' 5" (1.651 m)   Wt 238 lb (108 kg)   SpO2 99%   BMI 39.61 kg/m  Exam: General: sitting up in bed, in NAD  Eyes: PERRL, EOMI. Sclera mildly erythematous ENTM: MMM, oropharynx nonerythematous Neck: supple, normal ROM Cardiovascular: soft 2/6 systolic murmur, regular S1 and S2, no rubs or gallops Respiratory: CTAB, normal work of breathing on room air Gastrointestinal: TTP over L abdomen and + CVA tenderness on L flank, soft, nondistended, + bowel sounds MSK: moving limbs spontaneous, no edema of DIP or PIP Derm: reddish purple papules diffusely over body most prominent on extensor surfarces Neuro: alert, no focal deficits Psych: appropriate affect   Labs and Imaging: CBC BMET  Recent Labs  Lab 01/29/17 0019  WBC 3.8*  HGB 12.4  HCT 39.5  PLT 155   Recent Labs  Lab 01/29/17 0019  NA 139  K 3.4*  CL 105  CO2 23  BUN 6  CREATININE 1.11*  GLUCOSE 116*  CALCIUM 9.1     C-reactive protein 1.5  Ct Abdomen Pelvis W Contrast  Result Date: 01/29/2017 CLINICAL DATA:  Abdominal pain EXAM: CT ABDOMEN AND PELVIS WITH CONTRAST TECHNIQUE: Multidetector CT imaging of the abdomen and pelvis was performed using the  standard protocol following bolus administration of intravenous contrast. CONTRAST:  197m ISOVUE-300 IOPAMIDOL (ISOVUE-300) INJECTION 61% COMPARISON:  Abdominal ultrasound 07/19/2016 FINDINGS: Lower chest: No pulmonary nodules or pleural effusion. No visible pericardial effusion. Hepatobiliary: Normal hepatic contours and density. No visible biliary dilatation. Normal gallbladder. Pancreas: Normal contours without ductal dilatation. No peripancreatic fluid collection. Spleen: There is splenomegaly measuring 16 cm in craniocaudal dimension. Adrenals/Urinary Tract: --Adrenal glands: Normal. --Right kidney/ureter: There are 2 upper pole nonobstructive calculi measuring 1-2 mm each. No hydronephrosis.  The right ureter is unobstructed. --Left kidney/ureter: There is decreased contrast enhancement of the left renal parenchyma. There is moderate hydroureteronephrosis. Hydroureter is proximal to the point at which the ureter crosses over the left common iliac artery. At the center section, there is abnormal soft tissue surrounding the artery and ureter, measuring approximately 3.4 x 2.8 cm. --Urinary bladder: Unremarkable. Stomach/Bowel: --Stomach/Duodenum: No hiatal hernia or other gastric abnormality. Normal duodenal course and caliber. --Small bowel: No dilatation or inflammation. --Colon: No focal abnormality. --Appendix: Normal. Vascular/Lymphatic: There is inflammatory change and soft tissue density material surrounding the left common iliac artery, which remains patent. Margins of the abnormal density are somewhat indistinct. This does not appear to be a focal mass. There is atherosclerotic calcification of the non aneurysmal abdominal aorta. There are multiple subcentimeter retroperitoneal lymph nodes but none that are enlarged by CT size criteria. Reproductive: Normal uterus and ovaries. Musculoskeletal. No bony spinal canal stenosis or focal osseous abnormality. Other: None. IMPRESSION: 1. Moderate inflammatory  change surrounding the left common iliac artery. The margins are indistinct and are not particularly masslike. This may indicate a focal vasculitis. An isolated area of retroperitoneal fibrosis is a less likely possibility. 2. Moderate left hydroureteronephrosis and decreased contrast enhancement of the left kidney. The left ureter is at least partially obstructed at its intersection with the left common iliac artery. This may be reactive secondary to the adjacent inflammation. 3.  Aortic Atherosclerosis (ICD10-I70.0). Electronically Signed   By: Ulyses Jarred M.D.   On: 01/29/2017 04:43    Bufford Lope, DO 01/29/2017, 8:07 AM PGY-2, [place] Intern pager: [phone], text pages welcome

## 2017-01-30 ENCOUNTER — Encounter (HOSPITAL_COMMUNITY): Payer: Self-pay | Admitting: Urology

## 2017-01-30 ENCOUNTER — Inpatient Hospital Stay (HOSPITAL_COMMUNITY): Payer: Medicare HMO

## 2017-01-30 DIAGNOSIS — I10 Essential (primary) hypertension: Secondary | ICD-10-CM

## 2017-01-30 DIAGNOSIS — R229 Localized swelling, mass and lump, unspecified: Secondary | ICD-10-CM

## 2017-01-30 DIAGNOSIS — R109 Unspecified abdominal pain: Secondary | ICD-10-CM

## 2017-01-30 HISTORY — PX: TRANSTHORACIC ECHOCARDIOGRAM: SHX275

## 2017-01-30 LAB — MPO/PR-3 (ANCA) ANTIBODIES

## 2017-01-30 LAB — C3 COMPLEMENT: C3 Complement: 182 mg/dL — ABNORMAL HIGH (ref 82–167)

## 2017-01-30 LAB — ECHOCARDIOGRAM COMPLETE
HEIGHTINCHES: 65 in
Weight: 3808.1 oz

## 2017-01-30 LAB — CBC
HCT: 40.7 % (ref 36.0–46.0)
Hemoglobin: 13 g/dL (ref 12.0–15.0)
MCH: 24.5 pg — AB (ref 26.0–34.0)
MCHC: 31.9 g/dL (ref 30.0–36.0)
MCV: 76.6 fL — AB (ref 78.0–100.0)
PLATELETS: 184 10*3/uL (ref 150–400)
RBC: 5.31 MIL/uL — AB (ref 3.87–5.11)
RDW: 15.3 % (ref 11.5–15.5)
WBC: 4.1 10*3/uL (ref 4.0–10.5)

## 2017-01-30 LAB — BASIC METABOLIC PANEL
Anion gap: 15 (ref 5–15)
BUN: 6 mg/dL (ref 6–20)
CALCIUM: 8.7 mg/dL — AB (ref 8.9–10.3)
CHLORIDE: 101 mmol/L (ref 101–111)
CO2: 24 mmol/L (ref 22–32)
CREATININE: 1.17 mg/dL — AB (ref 0.44–1.00)
GFR calc non Af Amer: 49 mL/min — ABNORMAL LOW (ref >60)
GFR, EST AFRICAN AMERICAN: 57 mL/min — AB (ref >60)
GLUCOSE: 133 mg/dL — AB (ref 65–99)
Potassium: 4 mmol/L (ref 3.5–5.1)
Sodium: 140 mmol/L (ref 135–145)

## 2017-01-30 LAB — C4 COMPLEMENT: Complement C4, Body Fluid: 39 mg/dL (ref 14–44)

## 2017-01-30 LAB — ANA W/REFLEX IF POSITIVE: ANA: NEGATIVE

## 2017-01-30 LAB — HIV ANTIBODY (ROUTINE TESTING W REFLEX): HIV Screen 4th Generation wRfx: NONREACTIVE

## 2017-01-30 MED ORDER — OXYCODONE-ACETAMINOPHEN 7.5-325 MG PO TABS: 2.0000 | ORAL_TABLET | Freq: Four times a day (QID) | ORAL | Status: DC | PRN

## 2017-01-30 MED ORDER — HYDROCHLOROTHIAZIDE 25 MG PO TABS
25.0000 mg | ORAL_TABLET | Freq: Two times a day (BID) | ORAL | Status: DC
  Administered 2017-01-30 – 2017-02-01 (×4): 25 mg via ORAL
  Filled 2017-01-30 (×4): qty 1

## 2017-01-30 MED ORDER — OXYCODONE-ACETAMINOPHEN 7.5-325 MG PO TABS
2.0000 | ORAL_TABLET | Freq: Four times a day (QID) | ORAL | Status: DC | PRN
  Administered 2017-01-30 – 2017-01-31 (×2): 2 via ORAL
  Filled 2017-01-30 (×2): qty 2

## 2017-01-30 MED ORDER — OXYCODONE HCL 5 MG PO TABS
5.0000 mg | ORAL_TABLET | Freq: Once | ORAL | Status: AC
  Administered 2017-01-30: 5 mg via ORAL
  Filled 2017-01-30: qty 1

## 2017-01-30 MED ORDER — OXYCODONE HCL 5 MG PO TABS
10.0000 mg | ORAL_TABLET | Freq: Once | ORAL | Status: AC
  Administered 2017-01-30: 10 mg via ORAL
  Filled 2017-01-30: qty 2

## 2017-01-30 NOTE — Care Management Note (Signed)
Case Management Note  Patient Details  Name: Stacy Campbell MRN: [id number] Date of Birth: 01/05/1956  Subjective/Objective:  Admitted for significant hydronephrosis  Status post Left               Action/Plan:   Expected Discharge Date:    01/31/2017              Expected Discharge Plan:  Home/Self Care  Discharge planning Services  CM Consult  Post Acute Care Choice:  Durable Medical Equipment Choice offered to:  Patient  DME Arranged:  3-N-1 DME Agency:  [place].  Status of Service:  In process, will continue to follow  Kristen Cardinal, RN  37M-Nurse case manager [phone] 01/30/2017, 3:44 PM

## 2017-01-30 NOTE — Progress Notes (Signed)
Urology Progress Note   1 Day Post-Op  Subjective: S/p left ureteral stent placement 1/22 in PM. Today, states pain is mildly improved. Continues to endorse left abdominal and flank pain. Denies bladder symtpoms/LUTS from stent. Does endorse some left flank pain with urination. Denies dysuria.  UA from OR wnl, urine culture pending. Cr today stable, 1.17 (not improved after stent placement).   Objective: Vital signs in last 24 hours: Temp:  [97.9 F (36.6 C)-98.5 F (36.9 C)] 97.9 F (36.6 C) (01/23 0918) Pulse Rate:  [64-89] 64 (01/23 0918) Resp:  [7-17] 17 (01/23 0918) BP: (137-165)/(59-75) 165/59 (01/23 0918) SpO2:  [96 %-100 %] 100 % (01/23 0918) Weight:  [108 kg (238 lb 0.1 oz)] 108 kg (238 lb 0.1 oz) (01/22 2057)  Intake/Output from previous day: 01/22 0701 - 01/23 0700 In: 1130 [P.O.:1080; I.V.:50] Out: 860 [Urine:860] Intake/Output this shift: Total I/O In: 480 [P.O.:480] Out: -   Physical Exam:  General: Alert and oriented CV: RRR Lungs: Clear Abdomen: Soft, tender to palpation in LUQ and LLQ. Left flank tednerness to palpation  Ext: NT, No erythema  Lab Results: Recent Labs    01/29/17 0019 01/30/17 0558  HGB 12.4 13.0  HCT 39.5 40.7   BMET Recent Labs    01/29/17 0019 01/30/17 0558  NA 139 140  K 3.4* 4.0  CL 105 101  CO2 23 24  GLUCOSE 116* 133*  BUN 6 6  CREATININE 1.11* 1.17*  CALCIUM 9.1 8.7*     Studies/Results: Ct Abdomen Pelvis W Contrast  Result Date: 01/29/2017 CLINICAL DATA:  Abdominal pain EXAM: CT ABDOMEN AND PELVIS WITH CONTRAST TECHNIQUE: Multidetector CT imaging of the abdomen and pelvis was performed using the standard protocol following bolus administration of intravenous contrast. CONTRAST:  183mL ISOVUE-300 IOPAMIDOL (ISOVUE-300) INJECTION 61% COMPARISON:  Abdominal ultrasound 07/19/2016 FINDINGS: Lower chest: No pulmonary nodules or pleural effusion. No visible pericardial effusion. Hepatobiliary: Normal hepatic contours  and density. No visible biliary dilatation. Normal gallbladder. Pancreas: Normal contours without ductal dilatation. No peripancreatic fluid collection. Spleen: There is splenomegaly measuring 16 cm in craniocaudal dimension. Adrenals/Urinary Tract: --Adrenal glands: Normal. --Right kidney/ureter: There are 2 upper pole nonobstructive calculi measuring 1-2 mm each. No hydronephrosis. The right ureter is unobstructed. --Left kidney/ureter: There is decreased contrast enhancement of the left renal parenchyma. There is moderate hydroureteronephrosis. Hydroureter is proximal to the point at which the ureter crosses over the left common iliac artery. At the center section, there is abnormal soft tissue surrounding the artery and ureter, measuring approximately 3.4 x 2.8 cm. --Urinary bladder: Unremarkable. Stomach/Bowel: --Stomach/Duodenum: No hiatal hernia or other gastric abnormality. Normal duodenal course and caliber. --Small bowel: No dilatation or inflammation. --Colon: No focal abnormality. --Appendix: Normal. Vascular/Lymphatic: There is inflammatory change and soft tissue density material surrounding the left common iliac artery, which remains patent. Margins of the abnormal density are somewhat indistinct. This does not appear to be a focal mass. There is atherosclerotic calcification of the non aneurysmal abdominal aorta. There are multiple subcentimeter retroperitoneal lymph nodes but none that are enlarged by CT size criteria. Reproductive: Normal uterus and ovaries. Musculoskeletal. No bony spinal canal stenosis or focal osseous abnormality. Other: None. IMPRESSION: 1. Moderate inflammatory change surrounding the left common iliac artery. The margins are indistinct and are not particularly masslike. This may indicate a focal vasculitis. An isolated area of retroperitoneal fibrosis is a less likely possibility. 2. Moderate left hydroureteronephrosis and decreased contrast enhancement of the left kidney. The  left ureter is  at least partially obstructed at its intersection with the left common iliac artery. This may be reactive secondary to the adjacent inflammation. 3.  Aortic Atherosclerosis (ICD10-I70.0). Electronically Signed   By: Ulyses Jarred M.D.   On: 01/29/2017 04:43   Dg C-arm 1-60 Min-no Report  Result Date: 01/29/2017 Fluoroscopy was utilized by the requesting physician.  No radiographic interpretation.    Assessment/Plan:  62 y.o. female w/ ongoing left flank pain due to left hydroureteronephrosis of the proximal and mid ureter due to a mass encasing the left mid ureter and left common iliac artery.   - obtain PVR to ensure appropriate bladder emptying - radiology determined mass to be more soft tissue/RPF in nature. Agree with family medicine resident: biopsy of mass (IR versus open) important to determine etiology and further treatment  - Beyond any additional work-up as decided by the primary team and consultants, we will monitor her with serial CT scans if necessary, the first one in roughly 6 weeks or so.  Her stent will remain until the process has resolved and can be exchanged in 3 months if necessary. - follow up OR urine culture   Urology will continue to follow peripherally.    LOS: 1 day   FILIPPOU, PAULINE L 01/30/2017, 4:53 PM

## 2017-01-30 NOTE — Consult Note (Signed)
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  She had significant hydronephrosis proximal to the narrowed segment.  The urine that we drained initially was sedimentous and thus was sent for culture.  This needs to be followed up on and treated if positive.  She will have some stent discomfort and irritative voiding symptoms (frequency/urgency) for a few days that will settle down.  I'm hopeful that once the intra-abdominal process resolves/cools off that her ureter will normalize.    She is at risk for retroperitoneal fibrosis and may require further intervention down the road.   Beyond any additional work-up as decided by the primary team and consultants, we will monitor her with serial CT scans if necessary, the first one in roughly 6 weeks or so.   Her stent will remain until the process has resolved and be exchanged in 3 months if necessary.

## 2017-01-30 NOTE — Progress Notes (Signed)
  Echocardiogram 2D Echocardiogram has been performed.  Granvel Proudfoot T Kaz Auld 01/30/2017, 9:07 AM

## 2017-01-30 NOTE — Progress Notes (Signed)
Family Medicine Teaching Service Daily Progress Note Intern Pager: [phone]  Patient name: Stacy Campbell Medical record number: [id number] Date of birth: 04-16-1955 Age: 62 y.o. Gender: female  Primary Care Provider: Eloise Levels, MD Consultants: Urology, Vascular Surgery Code Status: full  Pt Overview and Major Events to Date:  1/22 admitted to fpts, Ureteral stent placed by Urology 1/23 echo  Assessment and Plan: Stacy Campbell is a 62 y.o. female presenting with L flank pain . PMH is significant for HTN, h/o atrial fibrillation with cardioversion in 1990s, asthma, chronic back pain, treated hepatitis C, psoriasis, anxiety, depression.  L flank pain and abdominal pain CT abdomen showing inflammatory changes around the L common iliac artery. 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 Interestingly the images to appear to resemble a very rare diagnosis of retroperitoneal fibrosis vs possible mass. Discussed case with radiology who felt that additional imaging was unlikely to be useful in furthering diagnosis. The options remain serial imaging to monitor evolution of area vs having general surgery/IR biopsy this area for tissue diagnosis. Will discuss with patient to determine her wishes and proceed from there. Elevated inflammatory markers (ESR, C-peptide, C3) are non-specific for diagnosis. - per urology recs, stent replacement in 3 months, serial ct in 6 weeks - vitals per floor - ANA, complement, ANCA pending - monitor electrolytes and SCr - avoid nephrotoxic medications - continue home percocet 10-647m q6prn pain  HTN, chronic. BP elevated 163/77 on admit but patient last took her medication 2 days ago. BP 165/69. Will watch for now. - monitor BP - continue home norvasc 194mqd, coreg 2526mID - restart home HCTZ as obstructive uropathy resolved  Heart  murmur Had echo performed 2/2 possible undiagnosed heart murmur. LVEF 65-70%. G2DD with pa pressure of 65m63m Asymptomatic.   Asthma with COPD. Stable. - continue home singular - Albuterol 2puffs q6prn  Anxiety/Depression with insomnia. Stable.  - continue home zoloft, abilify, xanax 1mg 22m prn, ambien 10mg 63mprn  FEN/GI: Heart-Healthy PPx: lovenox  Disposition: likely home  Subjective:  Doing ok this morning. Complaining about pain 2/2 stent placement. Undergoing Echo while in room.   Objective: Temp:  [97.9 F (36.6 C)-98.5 F (36.9 C)] 97.9 F (36.6 C) (01/23 0918) Pulse Rate:  [64-94] 64 (01/23 0918) Resp:  [7-25] 17 (01/23 0918) BP: (120-181)/(42-75) 165/59 (01/23 0918) SpO2:  [96 %-100 %] 100 % (01/23 0918) Weight:  [238 lb 0.1 oz (108 kg)] 238 lb 0.1 oz (108 kg) (01/22 2057) Physical Exam: General: Laying in bed, no acute distress, undergoing Echo Eyes: PERRL, EOMI Cardiovascular: soft 2/6 systolic murmur, regular S1 and S2, no rubs or gallops Respiratory: CTAB, normal work of breathing on room air Gastrointestinal: Improving tenderness to palpation, non-distended, bowel sounds present, discomfort from stent MSK: moving limbs spontaneous, 5/5 strength Neuro: alert, no focal deficits Psych: appropriate affect   Laboratory: Recent Labs  Lab 01/29/17 0019 01/30/17 0558  WBC 3.8* 4.1  HGB 12.4 13.0  HCT 39.5 40.7  PLT 155 184   Recent Labs  Lab 01/29/17 0019 01/30/17 0558  NA 139 140  K 3.4* 4.0  CL 105 101  CO2 23 24  BUN 6 6  CREATININE 1.11* 1.17*  CALCIUM 9.1 8.7*  PROT 7.7  --   BILITOT 0.6  --   ALKPHOS 84  --   ALT 38  --   AST 43*  --  GLUCOSE 116* 133*   Imaging/Diagnostic Tests: CLINICAL DATA:  Abdominal pain  EXAM: CT ABDOMEN AND PELVIS WITH CONTRAST  TECHNIQUE: Multidetector CT imaging of the abdomen and pelvis was performed using the standard protocol following bolus administration of intravenous  contrast.  CONTRAST:  119m ISOVUE-300 IOPAMIDOL (ISOVUE-300) INJECTION 61%  COMPARISON:  Abdominal ultrasound 07/19/2016  FINDINGS: Lower chest: No pulmonary nodules or pleural effusion. No visible pericardial effusion.  Hepatobiliary: Normal hepatic contours and density. No visible biliary dilatation. Normal gallbladder.  Pancreas: Normal contours without ductal dilatation. No peripancreatic fluid collection.  Spleen: There is splenomegaly measuring 16 cm in craniocaudal dimension.  Adrenals/Urinary Tract:  --Adrenal glands: Normal.  --Right kidney/ureter: There are 2 upper pole nonobstructive calculi measuring 1-2 mm each. No hydronephrosis. The right ureter is unobstructed.  --Left kidney/ureter: There is decreased contrast enhancement of the left renal parenchyma. There is moderate hydroureteronephrosis. Hydroureter is proximal to the point at which the ureter crosses over the left common iliac artery. At the center section, there is abnormal soft tissue surrounding the artery and ureter, measuring approximately 3.4 x 2.8 cm.  --Urinary bladder: Unremarkable.  Stomach/Bowel:  --Stomach/Duodenum: No hiatal hernia or other gastric abnormality. Normal duodenal course and caliber.  --Small bowel: No dilatation or inflammation.  --Colon: No focal abnormality.  --Appendix: Normal.  Vascular/Lymphatic: There is inflammatory change and soft tissue density material surrounding the left common iliac artery, which remains patent. Margins of the abnormal density are somewhat indistinct. This does not appear to be a focal mass. There is atherosclerotic calcification of the non aneurysmal abdominal aorta. There are multiple subcentimeter retroperitoneal lymph nodes but none that are enlarged by CT size criteria.  Reproductive: Normal uterus and ovaries.  Musculoskeletal. No bony spinal canal stenosis or focal osseous abnormality.  Other:  None.  IMPRESSION: 1. Moderate inflammatory change surrounding the left common iliac artery. The margins are indistinct and are not particularly masslike. This may indicate a focal vasculitis. An isolated area of retroperitoneal fibrosis is a less likely possibility. 2. Moderate left hydroureteronephrosis and decreased contrast enhancement of the left kidney. The left ureter is at least partially obstructed at its intersection with the left common iliac artery. This may be reactive secondary to the adjacent inflammation. 3.  Aortic Atherosclerosis (ICD10-I70.0).  FGuadalupe Dawn MD 01/30/2017, 9:39 AM PGY-1, [place] pager: [phone] text pages welcome

## 2017-01-30 NOTE — Progress Notes (Signed)
Family Medicine Progress Note  Discussed case with on call radiologist for second opinion. Not felt to be vasculitis and more strongly resembles retroperitoneal fibrosis vs soft tissue mass. Additional imaging not felt to be helpful in this case as tissue biopsy will be needed for definitive treatment. Will discuss case with IR, but unclear if they can reach this area safely. If IR unable to get tissue will consult general surgery for evaluation. Prior to calling these services will ask patient her wishes regarding workup.  Guadalupe Dawn MD PGY-1 Family Medicine Resident

## 2017-01-31 LAB — URINE CULTURE: Culture: NO GROWTH

## 2017-01-31 MED ORDER — OXYCODONE-ACETAMINOPHEN 7.5-325 MG PO TABS
1.0000 | ORAL_TABLET | Freq: Four times a day (QID) | ORAL | Status: DC | PRN
  Administered 2017-01-31: 1 via ORAL
  Filled 2017-01-31: qty 1

## 2017-01-31 MED ORDER — POLYETHYLENE GLYCOL 3350 17 G PO PACK
17.0000 g | PACK | Freq: Every day | ORAL | Status: DC
  Administered 2017-01-31 – 2017-02-01 (×2): 17 g via ORAL
  Filled 2017-01-31 (×2): qty 1

## 2017-01-31 MED ORDER — OXYCODONE-ACETAMINOPHEN 7.5-325 MG PO TABS
1.0000 | ORAL_TABLET | Freq: Four times a day (QID) | ORAL | Status: DC | PRN
  Administered 2017-01-31 – 2017-02-01 (×2): 2 via ORAL
  Filled 2017-01-31 (×3): qty 2

## 2017-01-31 MED ORDER — DOCUSATE SODIUM 100 MG PO CAPS
100.0000 mg | ORAL_CAPSULE | Freq: Every day | ORAL | Status: DC
  Administered 2017-01-31 – 2017-02-01 (×2): 100 mg via ORAL
  Filled 2017-01-31 (×2): qty 1

## 2017-01-31 NOTE — Care Management Note (Signed)
Case Management Note  Patient Details  Name: Stacy Campbell MRN: [id number] Date of Birth: 1955-03-28  Subjective/Objective:  Admitted for significant hydronephrosis  Status post Left               Action/Plan:   In to speak with patient.  Prior to admission patient lived at home with brother.  HOME DME:  None.  Brother takes her shopping and to medical appointments.  Uses [facility] on [address].  PCP is Rosana Berger.  Patient requested 3-in-1, states okay using [facility]. Referral called to [facility] with [facility] for DME: 3 in 1; to be delivered prior to discharge.   NCM will continue to follow for discharge transition home.  Expected Discharge Plan:  Home/Self Care  Discharge planning Services  CM Consult  Post Acute Care Choice:  Durable Medical Equipment Choice offered to:  Patient  DME Arranged:  3-N-1 DME Agency:  [facility].  Status of Service:  In process, will continue to follow  Kristen Cardinal, RN  63M-Nurse case manager [phone] 01/31/2017, 10:21 AM

## 2017-01-31 NOTE — Treatment Plan (Signed)
Case reviewed again, imaging reviewed. IR and general surgery consulted by primary team to assess input re best approach for biopsy of retroperitoneal mass. Neither see safe path for obtaining tissue diagnosis at this time. Urology does not feel that urologic exploratory laparotomy is worth risks at this time.  As such, will plan to reimage in 6 weeks to evaluate if mass/region has lessened in size on steroids.   Given one of leading diagnosis at this time includes rheumatologic/inflammatory, recommend formal consultation of rheumatology for input re need for steroids or possible medical treatment without tissue diagnosis at this time.  Jonna Clark, MD Urologic Surgery PGY4

## 2017-01-31 NOTE — Progress Notes (Signed)
Dr. Kathlene Cote has reviewed this patient's imaging.  Unfortunately, this mass is encased by the left common iliac artery and vein and cannot be safely sampled percutaneously.  He recommends a general surgery evaluation for sampling or a PET scan as an outpatient to see if there are any other sites that are amenable to biopsy.  D/w the primary team.  Henreitta Cea 10:54 AM 01/31/2017

## 2017-01-31 NOTE — Consult Note (Signed)
[facility] CM Primary Care Navigator  01/31/2017  Stacy Campbell 08-Mar-1955 [id number]   Met with patient and family at the bedside toidentify possible discharge needs. Patientstates having "severe left lower abdominal pain" thatled to this admission/ surgery. (Cystoscopy with Retrograde Pyelogram/ Ureteral Stent Placement- Left)   [place] herprimary care provider.   [facility] [address] to obtain medications without difficulty.   She reportsmanagingher ownmedications at home straight out of the containers.  Patient mentioned that brother Elta [place]) or any of her children (available) providetransportation toherdoctors'appointments.  [employer] transportation benefits discussed as well.  Patientliveswith brother who serves as her primary caregiver at home. Children (6) can also assist with care if needed as stated.  Anticipated plan for discharge ishome per patient and daughter.Await physician's order for discharge.  Patientvoiced understanding to call primary care provider's office when she returnshomefor a post discharge follow-upvisitwithin1-2 weeksor sooner if needs arise.Patient letter (with PCP's contact number) was provided as a reminder.   Discussed with patientregarding [facility] CM services available for health management at homebutshedenies any current needs or concerns at this point. Patient denies having COPD and reports managing Asthma with inhaler without pressing issues/ concerns.  Patientwouldonlyopt andverbally agree for EMMI calls to monitor her recovery. Referral made for EMMI General calls to follow-up with recovery after discharge.  Patientvoiced understandingof needto seekreferral from primary care provider to [facility] care management ifdeemed necessary and appropriatefor anyservices in the  future.   [facility] care management information was provided for future needs that shemay have.   For questions, please contact:  Dannielle Huh, BSN, RN- [facility] Primary Care Navigator  Telephone: [phone] [place]

## 2017-01-31 NOTE — Progress Notes (Addendum)
Family Medicine Progress Note  Discussed case with Modena Jansky PA from general surgery. He will discuss with attending in regards to best approach to getting tissue diagnosis for mass involving left common illiac and left ureter. Expect full consult note to follow. Daily progress note from family medicine service to follow.  Guadalupe Dawn MD PGY-1 Family Medicine Resident

## 2017-01-31 NOTE — Progress Notes (Signed)
Family Medicine progress update  Discussed case with Urology resident working with Dr. Louis Meckel, Dr. Shanon Brow. Urology not in agreement that this is a primarily urological issue. They feel as though general surgery would be well equipped to to pursue this biopsy and urology could be available as needed. Those services to discuss matters. I will update with any treatment plan changes.  Guadalupe Dawn MD PGY-1 Family Medicine Resident

## 2017-01-31 NOTE — Progress Notes (Signed)
Family Medicine Teaching Service Daily Progress Note Intern Pager: [phone]  Patient name: Stacy Campbell Medical record number: [id number] Date of birth: Apr 09, 1955 Age: 62 y.o. Gender: female  Primary Care Provider: Eloise Levels, MD Consultants: Urology, General surgery Code Status: full  Pt Overview and Major Events to Date:  1/22 admitted to fpts, Ureteral stent placed by Urology 1/23 echocardiogram performed 1/24 surgery consulted for possible bx  Assessment and Plan: Stacy Campbell is a 62 y.o. female presenting with L flank pain . PMH is significant for HTN, h/o atrial fibrillation with cardioversion in 1990s, asthma, chronic back pain, treated hepatitis C, psoriasis, anxiety, depression.  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 S/P stent on 1/22. Pain is improving although dull pain from stent remains. Having acute problem of ureteral obstruction resolved the focus is now on the mass in the pelvis creating this effect. Having discussed the case with radiology, urology, and general surgery it is felt that this is likely due to a soft tissue mass or retroperitoneal fibrosis. Tissue biopsy has been recommended. Discussed case with IR who felt it was too high risk to do this procedure via IR guidance. Appreciate their input. Have discussed case with both urology and general surgery to ascertain which service could best get tissue in this area. There is some disagreement regarding which service is better equipped to handle this procedure. Await recommendations from both services. Appreciate both services input on this matter. - follow up general surgery and urology recommendations - if unable to obtain bx, can possible refer to tertiary care center - vital signs per floor - monitor electrolytes - decreasing percocet to 7.5mg  q 6 hours - avoid nephrotoxic meds  HTN, chronic. BP elevated 126/60 on  admit but patient last took her medication 2 days ago. BP 165/69 - monitor BP - continue home norvasc 10mg  qd, coreg 25mg  BID - continue home HCTZ as obstructive uropathy resolved  Heart murmur Had echo performed 2/2 possible undiagnosed heart murmur. LVEF 65-70%. G2DD with pa pressure of 68mmhg. Asymptomatic.   Asthma with COPD. Stable. - continue home singular - Albuterol 2puffs q6prn  Anxiety/Depression with insomnia. Stable.  - continue home zoloft, abilify, xanax 1mg  BID prn, ambien 10mg  qhs prn  FEN/GI: Heart-Healthy PPx: lovenox  Disposition: likely home  Subjective:  Doing well this morning. No complaints. Tolerating po, ambulating with pt/ot.  Objective: Temp:  [97.8 F (36.6 C)-98.2 F (36.8 C)] 97.9 F (36.6 C) (01/24 0445) Pulse Rate:  [57-65] 57 (01/24 0445) Resp:  [17-18] 18 (01/24 0445) BP: (148-164)/(54-66) 148/66 (01/24 0445) SpO2:  [90 %-99 %] 90 % (01/24 0445) Weight:  [237 lb 10.5 oz (107.8 kg)] 237 lb 10.5 oz (107.8 kg) (01/23 2044) Physical Exam: General: Laying in bed, no acute distress, aox3 Eyes: PERRL, EOMI Cardiovascular: regular S1 and S2, no rubs or gallops Respiratory: CTAB, normal work of breathing on room air Gastrointestinal: continually improving tenderness to palpation, non-distended, bowel sounds present, discomfort from stent MSK: moving limbs spontaneous, 5/5 strength BUE/BLE Neuro: alert, no focal deficits Psych: appropriate affect   Laboratory: Recent Labs  Lab 01/29/17 0019 01/30/17 0558  WBC 3.8* 4.1  HGB 12.4 13.0  HCT 39.5 40.7  PLT 155 184   Recent Labs  Lab 01/29/17 0019 01/30/17 0558  NA 139 140  K 3.4* 4.0  CL 105 101  CO2 23 24  BUN 6 6  CREATININE 1.11* 1.17*  CALCIUM  9.1 8.7*  PROT 7.7  --   BILITOT 0.6  --   ALKPHOS 84  --   ALT 38  --   AST 43*  --   GLUCOSE 116* 133*   Imaging/Diagnostic Tests: CLINICAL DATA:  Abdominal pain  EXAM: CT ABDOMEN AND PELVIS WITH  CONTRAST  TECHNIQUE: Multidetector CT imaging of the abdomen and pelvis was performed using the standard protocol following bolus administration of intravenous contrast.  CONTRAST:  150mL ISOVUE-300 IOPAMIDOL (ISOVUE-300) INJECTION 61%  COMPARISON:  Abdominal ultrasound 07/19/2016  FINDINGS: Lower chest: No pulmonary nodules or pleural effusion. No visible pericardial effusion.  Hepatobiliary: Normal hepatic contours and density. No visible biliary dilatation. Normal gallbladder.  Pancreas: Normal contours without ductal dilatation. No peripancreatic fluid collection.  Spleen: There is splenomegaly measuring 16 cm in craniocaudal dimension.  Adrenals/Urinary Tract:  --Adrenal glands: Normal.  --Right kidney/ureter: There are 2 upper pole nonobstructive calculi measuring 1-2 mm each. No hydronephrosis. The right ureter is unobstructed.  --Left kidney/ureter: There is decreased contrast enhancement of the left renal parenchyma. There is moderate hydroureteronephrosis. Hydroureter is proximal to the point at which the ureter crosses over the left common iliac artery. At the center section, there is abnormal soft tissue surrounding the artery and ureter, measuring approximately 3.4 x 2.8 cm.  --Urinary bladder: Unremarkable.  Stomach/Bowel:  --Stomach/Duodenum: No hiatal hernia or other gastric abnormality. Normal duodenal course and caliber.  --Small bowel: No dilatation or inflammation.  --Colon: No focal abnormality.  --Appendix: Normal.  Vascular/Lymphatic: There is inflammatory change and soft tissue density material surrounding the left common iliac artery, which remains patent. Margins of the abnormal density are somewhat indistinct. This does not appear to be a focal mass. There is atherosclerotic calcification of the non aneurysmal abdominal aorta. There are multiple subcentimeter retroperitoneal lymph nodes but none that are enlarged  by CT size criteria.  Reproductive: Normal uterus and ovaries.  Musculoskeletal. No bony spinal canal stenosis or focal osseous abnormality.  Other: None.  IMPRESSION: 1. Moderate inflammatory change surrounding the left common iliac artery. The margins are indistinct and are not particularly masslike. This may indicate a focal vasculitis. An isolated area of retroperitoneal fibrosis is a less likely possibility. 2. Moderate left hydroureteronephrosis and decreased contrast enhancement of the left kidney. The left ureter is at least partially obstructed at its intersection with the left common iliac artery. This may be reactive secondary to the adjacent inflammation. 3.  Aortic Atherosclerosis (ICD10-I70.0).  Guadalupe Dawn, MD 01/31/2017, 9:30 AM PGY-1, [place] Intern pager: [phone], text pages welcome

## 2017-01-31 NOTE — Progress Notes (Addendum)
Family Medicine Progress Note  Discussed case with Dr. Rosendo Gros from General Surgery. Due to the location of the mass next to the ureters and left illiac artery, and the thought that this might incase the artery he recommended discussing the case with urology in regards to a potential biopsy. Given the location of the finding in the pelvis I agree that this seems reasonable. Will discuss case with Dr. Louis Meckel from Urology who did the initial stenting procedure.  Guadalupe Dawn MD PGY-1 Family Medicine Resident

## 2017-02-01 LAB — BASIC METABOLIC PANEL
Anion gap: 14 (ref 5–15)
BUN: 7 mg/dL (ref 6–20)
CHLORIDE: 96 mmol/L — AB (ref 101–111)
CO2: 27 mmol/L (ref 22–32)
Calcium: 9.1 mg/dL (ref 8.9–10.3)
Creatinine, Ser: 0.87 mg/dL (ref 0.44–1.00)
GFR calc Af Amer: >60 mL/min (ref >60)
GFR calc non Af Amer: >60 mL/min (ref >60)
GLUCOSE: 122 mg/dL — AB (ref 65–99)
POTASSIUM: 3.3 mmol/L — AB (ref 3.5–5.1)
Sodium: 137 mmol/L (ref 135–145)

## 2017-02-01 LAB — GLUCOSE, CAPILLARY
Glucose-Capillary: 125 mg/dL — ABNORMAL HIGH (ref 65–99)
Glucose-Capillary: 115 mg/dL — ABNORMAL HIGH (ref 65–99)

## 2017-02-01 LAB — CBC WITH DIFFERENTIAL/PLATELET
Basophils Absolute: 0 10*3/uL (ref 0.0–0.1)
Basophils Relative: 0 %
EOS PCT: 1 %
Eosinophils Absolute: 0.1 10*3/uL (ref 0.0–0.7)
HCT: 43.4 % (ref 36.0–46.0)
Hemoglobin: 13.5 g/dL (ref 12.0–15.0)
LYMPHS ABS: 1 10*3/uL (ref 0.7–4.0)
LYMPHS PCT: 19 %
MCH: 24.2 pg — AB (ref 26.0–34.0)
MCHC: 31.1 g/dL (ref 30.0–36.0)
MCV: 77.9 fL — AB (ref 78.0–100.0)
MONO ABS: 0.4 10*3/uL (ref 0.1–1.0)
Monocytes Relative: 7 %
Neutro Abs: 3.8 10*3/uL (ref 1.7–7.7)
Neutrophils Relative %: 73 %
PLATELETS: 227 10*3/uL (ref 150–400)
RBC: 5.57 MIL/uL — ABNORMAL HIGH (ref 3.87–5.11)
RDW: 15.7 % — AB (ref 11.5–15.5)
WBC: 5.2 10*3/uL (ref 4.0–10.5)

## 2017-02-01 MED ORDER — OXYCODONE-ACETAMINOPHEN 7.5-325 MG PO TABS: 1.0000 | ORAL_TABLET | Freq: Four times a day (QID) | ORAL | 0 refills | Status: DC | PRN

## 2017-02-01 MED ORDER — PREDNISONE 20 MG PO TABS: 20.0000 mg | ORAL_TABLET | Freq: Every day | ORAL | 0 refills | Status: DC

## 2017-02-01 MED ORDER — DOCUSATE SODIUM 250 MG PO CAPS: 250.0000 mg | ORAL_CAPSULE | Freq: Every day | ORAL | 0 refills | Status: DC

## 2017-02-01 NOTE — Discharge Summary (Signed)
[facility] Discharge Summary  Patient name: Stacy Campbell Medical record number: [id number] Date of birth: 06-Sep-1955 Age: 62 y.o. 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 Left Hydronephrosis Hypertension Grade II diastolic dysfunction Asthma with copd Anxiety/depression  Disposition: home  Discharge Condition: stable  Discharge Exam: General:Laying in bed, no acute distress, aox3 Eyes:PERRL, EOMI Cardiovascular: regular S1 and S2, no rubs or gallops Respiratory:CTAB, normal work of breathing on room air Gastrointestinal:continually improving tenderness to palpation, non-distended, bowel sounds present, discomfort from stent HQP:RFFMBW limbs spontaneous, 5/5 strength BUE/BLE Neuro:alert, no focal deficits Psych:appropriate affect  Brief Hospital Course:  62 year old who presented on 1/22 for flank pain. Patient was found to have inflammatory changes involving the left ureter and left common iliac artery. Due to this inflammation involving the left ureter, there was a significant amount of hydronephrosis seen on ct scan.  Left Hydronephrosis She had a ureteral stent placed by Urology on 1/22. This brought about significant relief of her symptoms and she had good urine output for the rest of her admission. Patient did complain of a dull ache from the stent after placement, but this was felt to be normal post-procedural pain by Urology. She was to follow up with Urology 3 months after discharge for stent replacement.  Inflammation involving the left ureter and left common iliac After stent placement  the attention was turned towards her inflammatory change. On 1/23 discussion with multiple radiologists reached a consensus that her inflammatory changes could represent a soft tissue mass vs a diagnosis of retroperitoneal fibrosis. It was not felt to be vasculiitis which is what the initial CT scan read interfered. Patient did have mildly elevated inflammatory markers, but rheum workup was negative. It was felt that tissue diagnosis would be the only definitive answer. Case discussed with IR, General surgery, and urology who all felt that biopsy would be too risk for this patient. Urology recommended a repeat CT scan in 6 weeks to monitor this area. They also recommended sending her out with prednisone for 2 weeks.  Grade II diastolic dysfunction On admission the patient had a 2/6 systolic murmur on ausculation. She underwent an echo which showed an ef of 65-70%, but grade II diastolic dysfunction. She had a mildly elevated pulmonary artery pressure.  Hypertension Patient hypertensive throughout hospitalization. Considered changes hctz to chlorthalidone. Will leave it up to follow up provider.  Issues for Follow Up:  1. Make sure that patient has 6 week contrast CT abdomen and pelvis ordered 2. Make sure that patient has follow up with urology scheduled (IE they called her) 3. Check on resolution of flank pain symptoms from stent 4. Monitor blood pressure while in office, if hypertensive consider switching to chlorthalidone over HCTZ  Significant Procedures:  Significant Labs and Imaging:  Recent Labs  Lab 01/29/17 0019 01/30/17 0558 02/01/17 0749  WBC 3.8* 4.1 5.2  HGB 12.4 13.0 13.5  HCT 39.5 40.7 43.4  PLT 155 184 227   Recent Labs  Lab 01/29/17 0019 01/30/17 0558 02/01/17 0749  NA 139 140 137  K 3.4* 4.0 3.3*  CL 105 101 96*  CO2 23 24 27   GLUCOSE 116* 133* 122*  BUN 6 6  7  CREATININE 1.11* 1.17* 0.87  CALCIUM 9.1 8.7* 9.1  ALKPHOS 84  --   --   AST 43*  --   --   ALT  38  --   --   ALBUMIN 3.5  --   --     Results/Tests Pending at Time of Discharge:  Discharge Medications:  Allergies as of 02/01/2017      Reactions   Hydrocodone Itching   Aspirin Itching   Lower dose doesn't make her itch (she takes it every day)   Eggs Or Egg-derived Products Nausea Only   No reaction if in another food      Medication List    STOP taking these medications   BC FAST PAIN RELIEF ARTHRITIS PO   HYDROcodone-acetaminophen 5-325 MG tablet Commonly known as:  NORCO/VICODIN     TAKE these medications   albuterol 108 (90 Base) MCG/ACT inhaler Commonly known as:  PROVENTIL HFA;VENTOLIN HFA Inhale 2 puffs into the lungs every 6 (six) hours as needed for wheezing or shortness of breath.   ALPRAZolam 1 MG tablet Commonly known as:  XANAX take 1 tablet by mouth twice a day if needed for anxiety   amLODipine 10 MG tablet Commonly known as:  NORVASC take 1 tablet by mouth once daily   ARIPiprazole 5 MG tablet Commonly known as:  ABILIFY Take 1 tablet (5 mg total) by mouth daily. Per Dr. Tammi Klippel.  One pill at night.  #30 with 11 refills.   benzonatate 100 MG capsule Commonly known as:  TESSALON Take 1 capsule (100 mg total) by mouth 3 (three) times daily as needed for cough.   carvedilol 25 MG tablet Commonly known as:  COREG Take 1 tablet (25 mg total) by mouth 2 (two) times daily with a meal.   cetirizine 10 MG tablet Commonly known as:  ZYRTEC Take 1 tablet (10 mg total) by mouth daily. What changed:    when to take this  reasons to take this   docusate sodium 250 MG capsule Commonly known as:  COLACE Take 1 capsule (250 mg total) by mouth daily.   hydrochlorothiazide 25 MG tablet Commonly known as:  HYDRODIURIL Take 1 tablet (25 mg total) by mouth 2 (two) times daily.   meclizine 25 MG tablet Commonly known as:  ANTIVERT Take 1 tablet (25 mg total) by mouth 3 (three) times daily as needed for dizziness.   montelukast 10 MG  tablet Commonly known as:  SINGULAIR Take 1 tablet (10 mg total) by mouth at bedtime. What changed:    when to take this  reasons to take this   oxyCODONE-acetaminophen 5-325 MG tablet Commonly known as:  PERCOCET/ROXICET Take 2 tablets by mouth 4 (four) times daily. What changed:  Another medication with the same name was added. Make sure you understand how and when to take each.   oxyCODONE-acetaminophen 7.5-325 MG tablet Commonly known as:  PERCOCET Take 1-2 tablets by mouth every 6 (six) hours as needed for severe pain. What changed:  You were already taking a medication with the same name, and this prescription was added. Make sure you understand how and when to take each.   predniSONE 20 MG tablet Commonly known as:  DELTASONE Take 1 tablet (20 mg total) by mouth daily with breakfast.   promethazine 25 MG tablet Commonly known as:  PHENERGAN take 1 tablet by mouth every 8 hours if needed for nausea and vomiting   sertraline 100 MG tablet Commonly known as:  ZOLOFT Take 1 tablet (100 mg total) by mouth daily. Target dose is 150 mg at night.  Per Dr. Tammi Klippel.  #45 with 11 refills. What changed:    how much to take  additional instructions   triamcinolone ointment 0.5 % Commonly known as:  KENALOG Apply 1 application topically 2 (two) times daily. What changed:    when to take this  reasons to take this   zolpidem 10 MG tablet Commonly known as:  AMBIEN Take 1 tablet (10 mg total) by mouth at bedtime as needed for sleep.            Durable Medical Equipment  (From admission, onward)        Start     Ordered   01/30/17 1544  For home use only DME 3 n 1  Once     01/30/17 1543      Discharge Instructions: Please refer to Patient Instructions section of EMR for full details.  Patient was counseled important signs and symptoms that should prompt return to medical care, changes in medications, dietary instructions, activity restrictions, and follow up  appointments.   Follow-Up Appointments: [facility] Follow up.   Why:  Arranged to be delivered prior to discharge. Contact information: [place] [address] [phone]        Ardis Hughs, MD Follow up in 6 week(s).   Specialty:  Urology Contact information: [place] [address] [phone]        Rogue Bussing, MD Follow up on 02/11/2017.   Specialty:  Family Medicine Why:  Please keep your hospital follow up with Dr. Ola Spurr at 8:50 on 2/4. Please arrive 20 minutes early. Contact information: [place] [address] [phone]           Guadalupe Dawn, MD 02/01/2017, 5:30 PM PGY-1, [place]

## 2017-02-01 NOTE — Progress Notes (Signed)
Family Medicine progress update  Discussed case with Dr. Louis Meckel, Urology attending. He feels strongly that a biopsy would not be worth the risk in this case. His recommendations would be to start prednisone for a possible inflammatory etiology and to re-image in 6 weeks. Given the location of the inflammation, if it is cancer her options for treatment would be very limited and felt that prednisone would cause very little if any harm and could perhaps be therapeutic if lymphoma. He discussed with patient and she is in agreement. Will plan for afternoon discharge. Please see my daily progress note to follow for management of other medical problems. Dr. Carlton Adam note to follow.  Guadalupe Dawn MD PGY-1 Family Medicine Resident

## 2017-02-01 NOTE — Care Management Important Message (Signed)
Important Message  Patient Details  Name: Stacy Campbell MRN: [id number] Date of Birth: June 25, 1955   Medicare Important Message Given:  Yes    Barb Merino Evanell Redlich 02/01/2017, 2:32 PM

## 2017-02-01 NOTE — Discharge Instructions (Signed)
You were admitted to the hospital because you have an area of inflammation that was limiting the flow from your kidney down to your bladder. This was stented open. In regards to your workup for the inflammation you have a hospital follow up scheduled with both Family medicine clinic and Urology. You will get a ct scan in 6 weeks to follow up how this inflammation is resolving. Per urology recommendations we are sending you home with prednisone. I have given you about a week's worth of pain medication. For your constipation please take miralax. You can start out with a capful at each meal and then titrate that amount up. By titrate up you can increase the amount of capfuls you take, for example instead of every meal you can do one in between each meal. I have also given you a script for colace which is another stool softener.   DISCHARGE INSTRUCTIONS FOR KIDNEY STONE/URETERAL STENT   MEDICATIONS:  1. Resume all your other meds from home - except do not take any extra narcotic pain meds that you may have at home.   ACTIVITY:  1. No strenuous activity x 1week  2. No driving while on narcotic pain medications  3. Drink plenty of water  4. Continue to walk at home - you can still get blood clots when you are at home, so keep active, but don't over do it.  5. May return to work/school tomorrow or when you feel ready   BATHING:  1. You can shower and we recommend daily showers   SIGNS/SYMPTOMS TO CALL:  Please call us if you have a fever greater than 101.5, uncontrolled nausea/vomiting, uncontrolled pain, dizziness, unable to urinate, bloody urine, chest pain, shortness of breath, leg swelling, leg pain, redness around wound, drainage from wound, or any other concerns or questions.   You can reach [place] at [phone].   FOLLOW-UP:  1. You have an appointment in 6 weeks with a CT scan prior prior.

## 2017-02-04 ENCOUNTER — Other Ambulatory Visit: Payer: Self-pay | Admitting: Gastroenterology

## 2017-02-04 DIAGNOSIS — K7460 Unspecified cirrhosis of liver: Secondary | ICD-10-CM | POA: Diagnosis not present

## 2017-02-04 DIAGNOSIS — K7469 Other cirrhosis of liver: Secondary | ICD-10-CM

## 2017-02-04 DIAGNOSIS — N135 Crossing vessel and stricture of ureter without hydronephrosis: Secondary | ICD-10-CM | POA: Diagnosis not present

## 2017-02-05 ENCOUNTER — Telehealth: Payer: Self-pay | Admitting: Vascular Surgery

## 2017-02-05 NOTE — Telephone Encounter (Signed)
Sched appt 03/22/17 at 12:45. Pt's vm full, her phone carrier had an option to send an SMS message informing the pt we had called, had an SMS message sent.

## 2017-02-05 NOTE — Telephone Encounter (Signed)
-----   Message from Mena Goes, RN sent at 02/04/2017  5:04 PM EST ----- Regarding: 6 weeks to discuss surgery    ----- Message ----- From: Waynetta Sandy, MD Sent: 02/04/2017   2:23 PM To: [address]  Stacy Campbell [id number] 02/12/1955   I need to see her in 6 weeks after she has her contrasted CT scan.   brandon

## 2017-02-08 ENCOUNTER — Ambulatory Visit
Admission: RE | Admit: 2017-02-08 | Discharge: 2017-02-08 | Disposition: A | Discharge status: Home / Self Care | Payer: Medicare HMO | Source: Ambulatory Visit | Attending: Gastroenterology | Admitting: Gastroenterology

## 2017-02-08 DIAGNOSIS — K7460 Unspecified cirrhosis of liver: Secondary | ICD-10-CM | POA: Diagnosis not present

## 2017-02-08 DIAGNOSIS — K7469 Other cirrhosis of liver: Secondary | ICD-10-CM

## 2017-02-11 ENCOUNTER — Ambulatory Visit (INDEPENDENT_AMBULATORY_CARE_PROVIDER_SITE_OTHER): Payer: Medicare HMO | Admitting: Internal Medicine

## 2017-02-11 ENCOUNTER — Other Ambulatory Visit: Payer: Self-pay | Admitting: *Deleted

## 2017-02-11 ENCOUNTER — Other Ambulatory Visit: Payer: Self-pay

## 2017-02-11 ENCOUNTER — Encounter: Payer: Self-pay | Admitting: Internal Medicine

## 2017-02-11 VITALS — BP 132/78 | HR 70 | Temp 98.4°F | Wt 225.6 lb

## 2017-02-11 DIAGNOSIS — R10A0 Flank pain, unspecified side: Secondary | ICD-10-CM

## 2017-02-11 DIAGNOSIS — R109 Unspecified abdominal pain: Secondary | ICD-10-CM

## 2017-02-11 DIAGNOSIS — R1907 Generalized intra-abdominal and pelvic swelling, mass and lump: Secondary | ICD-10-CM

## 2017-02-11 DIAGNOSIS — K219 Gastro-esophageal reflux disease without esophagitis: Secondary | ICD-10-CM | POA: Diagnosis not present

## 2017-02-11 MED ORDER — FAMOTIDINE 20 MG PO CHEW: 20.0000 mg | CHEWABLE_TABLET | Freq: Two times a day (BID) | ORAL | 2 refills | Status: DC

## 2017-02-11 NOTE — Patient Instructions (Signed)
[name],  I am so glad you are feeling better.  I will order your follow-up CT.  Try pepcid twice daily for reflux. It may also help to have dinner earlier.   Please see [place] back if you have trouble with urination or worsening pain.  Best, Dr. Ola Spurr   Food Choices for Gastroesophageal Reflux Disease, Adult When you have gastroesophageal reflux disease (GERD), the foods you eat and your eating habits are very important. Choosing the right foods can help ease your discomfort. What guidelines do I need to follow?  Choose fruits, vegetables, whole grains, and low-fat dairy products.  Choose low-fat meat, fish, and poultry.  Limit fats such as oils, salad dressings, butter, nuts, and avocado.  Keep a food diary. This helps you identify foods that cause symptoms.  Avoid foods that cause symptoms. These may be different for everyone.  Eat small meals often instead of 3 large meals a day.  Eat your meals slowly, in a place where you are relaxed.  Limit fried foods.  Cook foods using methods other than frying.  Avoid drinking alcohol.  Avoid drinking large amounts of liquids with your meals.  Avoid bending over or lying down until 2-3 hours after eating. What foods are not recommended? These are some foods and drinks that may make your symptoms worse: Vegetables Tomatoes. Tomato juice. Tomato and spaghetti sauce. Chili peppers. Onion and garlic. Horseradish. Fruits Oranges, grapefruit, and lemon (fruit and juice). Meats High-fat meats, fish, and poultry. This includes hot dogs, ribs, ham, sausage, salami, and bacon. Dairy Whole milk and chocolate milk. Sour cream. Cream. Butter. Ice cream. Cream cheese. Drinks Coffee and tea. Bubbly (carbonated) drinks or energy drinks. Condiments Hot sauce. Barbecue sauce. Sweets/Desserts Chocolate and cocoa. Donuts. Peppermint and spearmint. Fats and Oils High-fat foods. This includes [place] fries and potato  chips. Other Vinegar. Strong spices. This includes black pepper, white pepper, red pepper, cayenne, curry powder, cloves, ginger, and chili powder. The items listed above may not be a complete list of foods and drinks to avoid. Contact your dietitian for more information. This information is not intended to replace advice given to you by your health care provider. Make sure you discuss any questions you have with your health care provider. Document Released: 06/26/2011 Document Revised: 06/02/2015 Document Reviewed: 10/29/2012 Elsevier Interactive Patient Education  2017 [employer].

## 2017-02-12 ENCOUNTER — Encounter: Payer: Self-pay | Admitting: *Deleted

## 2017-02-12 NOTE — Patient Outreach (Signed)
[facility]) Care Management  02/11/2017  Stacy Campbell December 01, 1955 [id number]  EMMI- General Discharge RED ON EMMI ALERT DAY#: 4 DATE: 02/08/17 RED ALERT: Lost interest In things? Yes Sad/hopeless/anxious/empty? Yes  Outgoing telephone call to patient and HIPAA verified with patient. Spoke with patient. Reviewed and addressed red alert. Patient confirmed having lost interest in things and feelings of sadness/hopeless/anxious/empty. She explained, her emotions have stemmed from the death of her daughter in 04/13/2007. Patient discussed being prescribed Zoloft, Ability, and attending sessions with a Psychiatrist regularly. Patient stated, she is "raising her daughter's 2 children". She verbalized experiencing depression and anxiety frequently. She voiced how supportive her family has been.   Patient reported receiving, reading, and understanding her hospital discharge instructions. She is able to afford her medications and taking them as prescribed. She continues to prepare meals that are "light". She voiced being very satisfied with the services received during her hospital stay. She had a follow-up hospital discharge visit on 02/11/17 with her primary MD. Advised patient that he may continue to get automated post discharge calls to assess how she is doing following recent hospitalization. Inform patient, she will receive a call from a nurse if any of her responses were abnormal. Patient voiced understanding and was appreciative of f/u call.   Plan: RN CM will notify [facility] Case Management Assistant regarding case closure.  RN CM advised patient to contact RNCM for any needs or concerns. RM CM will send successful outreach letter to patient. RN CM will send patient EMMI educational materials.    Lake Bells, RN, BSN, MHA/MSL, [place] Telephonic Care Manager Coordinator [facility] Direct Phone: [phone] Toll Free: [phone] Fax:  [phone]

## 2017-02-15 ENCOUNTER — Encounter: Payer: Self-pay | Admitting: Internal Medicine

## 2017-02-15 DIAGNOSIS — K219 Gastro-esophageal reflux disease without esophagitis: Secondary | ICD-10-CM | POA: Insufficient documentation

## 2017-02-15 DIAGNOSIS — R109 Unspecified abdominal pain: Secondary | ICD-10-CM | POA: Insufficient documentation

## 2017-02-15 NOTE — Assessment & Plan Note (Addendum)
-   Ordered pepcid 20 mg BID for patient to trial for 6-8 weeks - Gave handout on foods that makes reflux worse

## 2017-02-15 NOTE — Assessment & Plan Note (Signed)
-   Resolved. Was found to have inflammation of left common iliac artery affecting left ureter that will require follow-up with vascular surgery and urology.  - Complete 2-week prednisone course.  - Will order CT abdomen pelvis with contrast for ~03/12/17 as recommended follow-up - Has urology and vascular surgery follow-up already

## 2017-02-15 NOTE — Progress Notes (Signed)
[facility] Progress Note  Subjective:  Stacy Campbell is a 62 y.o. female with history of asthma and COPD, HTN, and anxiety who presents for hospital follow-up. She was admitted from 1/22-1/25/29 for flank pain and found to have inflammatory changes of left ureter with hydronephrosis present and left common iliac artery. She had improvement in pain once ureteral stent was placed. Areas of inflammation thought to be secondary to a soft tissue mass vs retroperitoneal fibrosis; biopsy was not pursued due to risk of procedure. Urology recommended repeat CT scan in 6 weeks (~03/12/17). She was discharged on course of prednisone.   Since discharge, patient reports improvement in pain and good urine flow. She had some nausea at the end of last week but has done well with eating since the weekend. She does have some burning pain after eating and would like to try something for reflux. She reports she has follow-up appointments with urology and vascular surgery.   ROS: No fevers, no dysuria  Allergies  Allergen Reactions  . Hydrocodone Itching  . Aspirin Itching    Lower dose doesn't make her itch (she takes it every day)  . Eggs Or Egg-Derived Products Nausea Only    No reaction if in another food    Social History   Tobacco Use  . Smoking status: Former Smoker    Packs/day: 0.25    Years: 37.00    Pack years: 9.25    Types: Cigarettes    Last attempt to quit: 03/30/2008    Years since quitting: 8.8  . Smokeless tobacco: Never Used  Substance Use Topics  . Alcohol use: No    Objective: Blood pressure 132/78, pulse 70, temperature 98.4 F (36.9 C), temperature source Oral, weight 225 lb 9.6 oz (102.3 kg), SpO2 99 %. Body mass index is 37.54 kg/m. Constitutional: Obese female, very pleasant HENT: MMM Cardiovascular: RRR, S1, S2, no m/r/g.  Pulmonary/Chest: Effort normal and breath sounds normal.  Abdominal: Soft. +BS, NT Musculoskeletal: No CVA  tenderness Skin: Skin is warm and dry. No rash noted.  Psychiatric: Normal mood and affect.  Vitals reviewed  Assessment/Plan: Flank pain - Resolved. Was found to have inflammation of left common iliac artery affecting left ureter that will require follow-up with vascular surgery and urology.  - Complete 2-week prednisone course.  - Will order CT abdomen pelvis with contrast for ~03/12/17 as recommended follow-up - Has urology and vascular surgery follow-up already  GERD (gastroesophageal reflux disease) - Ordered pepcid 20 mg BID for patient to trial for 6-8 weeks - Gave handout on foods that makes reflux worse  Follow-up if have difficulty urinating or return of pain.  Olene Floss, MD [place], PGY-3

## 2017-02-18 DIAGNOSIS — G894 Chronic pain syndrome: Secondary | ICD-10-CM | POA: Diagnosis not present

## 2017-02-18 DIAGNOSIS — M545 Low back pain: Secondary | ICD-10-CM | POA: Diagnosis not present

## 2017-02-18 DIAGNOSIS — M25559 Pain in unspecified hip: Secondary | ICD-10-CM | POA: Diagnosis not present

## 2017-02-18 DIAGNOSIS — Z79891 Long term (current) use of opiate analgesic: Secondary | ICD-10-CM | POA: Diagnosis not present

## 2017-02-20 ENCOUNTER — Telehealth: Payer: Self-pay

## 2017-02-20 NOTE — Telephone Encounter (Signed)
Called patient to inform her of her appointment for CT abdomen on 03/13/2016 at  [place] @ 1030.  She was instructed to call to pick up the contrast prior to her appointment. I gave her the number and address.    [facility] [place] [phone]  Patient seemed a bit confused and said that she had received  Calls on other CT dates as well as some other appointments. I told her that I did not see the appointments that she spoke of. If she calls back with questions this is the information.Ozella Almond, CMA

## 2017-02-22 ENCOUNTER — Ambulatory Visit
Admission: RE | Admit: 2017-02-22 | Discharge: 2017-02-22 | Disposition: A | Discharge status: Home / Self Care | Payer: Medicare HMO | Source: Ambulatory Visit | Attending: Gastroenterology | Admitting: Gastroenterology

## 2017-02-22 DIAGNOSIS — K7460 Unspecified cirrhosis of liver: Secondary | ICD-10-CM

## 2017-02-26 ENCOUNTER — Ambulatory Visit (INDEPENDENT_AMBULATORY_CARE_PROVIDER_SITE_OTHER): Payer: Medicare HMO | Admitting: Psychology

## 2017-02-26 DIAGNOSIS — F411 Generalized anxiety disorder: Secondary | ICD-10-CM

## 2017-02-26 DIAGNOSIS — F321 Major depressive disorder, single episode, moderate: Secondary | ICD-10-CM

## 2017-02-26 NOTE — Progress Notes (Signed)
Reason for follow-up:  Stacy Campbell presents for follow-up mood management in the context of chronic stress.  She was recently hospitalized so has some new acute stressors as well.  Issues discussed:  Discussed her hospitalization and she updated me on the situation with [name].  She is feeling scared and a little overwhelmed but grateful for the care she received and the support of her family.

## 2017-02-26 NOTE — Assessment & Plan Note (Addendum)
PHQ-9 is 6 which surprises me given recent events.  She reports she has remained calm with [name] - doesn't fuss or lose her temper with her.  Is working to get her removed from the home still.  Thinks that when this happens, she will notice an improvement in her mood.    Will follow by phone as needed given her heavy appointment load right now.  She will also call to schedule if she thinks this is warranted.

## 2017-02-26 NOTE — Assessment & Plan Note (Signed)
GAD-7 is 13 which doesn't surprise me given the recent hospitalization and the follow-up that she has scheduled.  Her functional indicator here was "somewhat difficult."  It sounds like she is able to function adequately in the home despite her symptoms right now.

## 2017-03-04 ENCOUNTER — Other Ambulatory Visit: Payer: Self-pay

## 2017-03-04 MED ORDER — ARIPIPRAZOLE 5 MG PO TABS: ORAL_TABLET | ORAL | 1 refills | Status: DC

## 2017-03-13 ENCOUNTER — Ambulatory Visit
Admission: RE | Admit: 2017-03-13 | Discharge: 2017-03-13 | Disposition: A | Discharge status: Home / Self Care | Payer: Medicare HMO | Source: Ambulatory Visit | Attending: Family Medicine | Admitting: Family Medicine

## 2017-03-13 DIAGNOSIS — K8020 Calculus of gallbladder without cholecystitis without obstruction: Secondary | ICD-10-CM | POA: Diagnosis not present

## 2017-03-13 DIAGNOSIS — R1907 Generalized intra-abdominal and pelvic swelling, mass and lump: Secondary | ICD-10-CM

## 2017-03-13 DIAGNOSIS — R160 Hepatomegaly, not elsewhere classified: Secondary | ICD-10-CM | POA: Diagnosis not present

## 2017-03-13 DIAGNOSIS — K7460 Unspecified cirrhosis of liver: Secondary | ICD-10-CM | POA: Diagnosis not present

## 2017-03-13 MED ORDER — IOPAMIDOL (ISOVUE-300) INJECTION 61%
125.0000 mL | Freq: Once | INTRAVENOUS | Status: AC | PRN
  Administered 2017-03-13: 125 mL via INTRAVENOUS

## 2017-03-18 DIAGNOSIS — Z79891 Long term (current) use of opiate analgesic: Secondary | ICD-10-CM | POA: Diagnosis not present

## 2017-03-18 DIAGNOSIS — G894 Chronic pain syndrome: Secondary | ICD-10-CM | POA: Diagnosis not present

## 2017-03-18 DIAGNOSIS — M545 Low back pain: Secondary | ICD-10-CM | POA: Diagnosis not present

## 2017-03-21 DIAGNOSIS — K8020 Calculus of gallbladder without cholecystitis without obstruction: Secondary | ICD-10-CM | POA: Diagnosis not present

## 2017-03-21 DIAGNOSIS — N131 Hydronephrosis with ureteral stricture, not elsewhere classified: Secondary | ICD-10-CM | POA: Diagnosis not present

## 2017-03-22 ENCOUNTER — Encounter: Payer: Self-pay | Admitting: Vascular Surgery

## 2017-03-22 ENCOUNTER — Ambulatory Visit (INDEPENDENT_AMBULATORY_CARE_PROVIDER_SITE_OTHER): Payer: Medicare HMO | Admitting: Vascular Surgery

## 2017-03-22 VITALS — BP 125/73 | HR 70 | Temp 97.3°F | Resp 16 | Ht 65.0 in | Wt 223.0 lb

## 2017-03-22 DIAGNOSIS — I10 Essential (primary) hypertension: Secondary | ICD-10-CM | POA: Diagnosis not present

## 2017-03-22 NOTE — Progress Notes (Signed)
Patient ID: Stacy Campbell, female   DOB: November 27, 1955, 62 y.o.   MRN: [id number]  Reason for Consult: Pre-op Exam (6 wk f/u to discuss surgery)   Referred by Eloise Levels, MD  Subjective:     HPI:  Stacy Campbell is a 62 y.o. female whom I first saw in the hospital in January at the time she had acute left lower quadrant pain was found to have inflammatory change surrounding her left common iliac artery and also had hydronephrosis.  She underwent placement of ureteral stent.  She also was treated with steroids for a short course.  She has had no left lower extremity pain other than her normal swelling which occurs in both legs.  She does walk with the help of a cane.  She does not have any tissue loss or ulceration that she does have dermatologic issues involving all of her extremities.  Past Medical History:  Diagnosis Date  . Anxiety   . Arthritis    "lower back" (01/29/2017)  . Asthma   . Bone spur    spine  . Chronic lower back pain   . Depression   . Eczema   . GERD (gastroesophageal reflux disease)   . Headache    "at least weekly" (01/29/2017)  . History of atrial fibrillation without current medication    cardioversion in 1990s  . Hypertension   . Insomnia    Family History  Problem Relation Age of Onset  . Asthma Mother   . Diabetes Mother   . Asthma Father   . Diabetes Father   . Cancer Father        prostate  . Hepatitis B Daughter   . Breast cancer Neg Hx    Past Surgical History:  Procedure Laterality Date  . CARDIOVERSION  1990s  . CYSTOSCOPY W/ URETERAL STENT PLACEMENT Left 01/29/2017   Procedure: CYSTOSCOPY WITH RETROGRADE PYELOGRAM/URETERAL STENT PLACEMENT;  Surgeon: Ardis Hughs, MD;  Location: [facility];  Service: Urology;  Laterality: Left;  . CYSTOSCOPY WITH RETROGRADE PYELOGRAM, URETEROSCOPY AND STENT PLACEMENT Left 01/29/2017   CYSTOSCOPY WITH RETROGRADE PYELOGRAM/URETERAL STENT PLACEMENT    Short Social History:  Social  History   Tobacco Use  . Smoking status: Former Smoker    Packs/day: 0.25    Years: 37.00    Pack years: 9.25    Types: Cigarettes    Last attempt to quit: 03/30/2008    Years since quitting: 8.9  . Smokeless tobacco: Never Used  Substance Use Topics  . Alcohol use: No    Allergies  Allergen Reactions  . Hydrocodone Itching  . Aspirin Itching    Lower dose doesn't make her itch (she takes it every day)  . Eggs Or Egg-Derived Products Nausea Only    No reaction if in another food    Current Outpatient Medications  Medication Sig Dispense Refill  . albuterol (PROVENTIL HFA;VENTOLIN HFA) 108 (90 Base) MCG/ACT inhaler Inhale 2 puffs into the lungs every 6 (six) hours as needed for wheezing or shortness of breath. 1 Inhaler 2  . ALPRAZolam (XANAX) 1 MG tablet take 1 tablet by mouth twice a day if needed for anxiety 30 tablet 3  . amLODipine (NORVASC) 10 MG tablet take 1 tablet by mouth once daily 90 tablet 3  . ARIPiprazole (ABILIFY) 5 MG tablet Take one by mouth daily 30 tablet 1  . benzonatate (TESSALON) 100 MG capsule Take 1 capsule (100 mg total) by mouth 3 (three) times daily as  needed for cough. 30 capsule 3  . carvedilol (COREG) 25 MG tablet Take 1 tablet (25 mg total) by mouth 2 (two) times daily with a meal. 180 tablet 3  . cetirizine (ZYRTEC) 10 MG tablet Take 1 tablet (10 mg total) by mouth daily. (Patient taking differently: Take 10 mg by mouth daily as needed for allergies. ) 30 tablet 0  . docusate sodium (COLACE) 250 MG capsule Take 1 capsule (250 mg total) by mouth daily. 10 capsule 0  . Famotidine 20 MG CHEW Chew 1 tablet (20 mg total) by mouth 2 (two) times daily. 60 each 2  . hydrochlorothiazide (HYDRODIURIL) 25 MG tablet Take 1 tablet (25 mg total) by mouth 2 (two) times daily. 90 tablet 3  . meclizine (ANTIVERT) 25 MG tablet Take 1 tablet (25 mg total) by mouth 3 (three) times daily as needed for dizziness. 30 tablet 1  . montelukast (SINGULAIR) 10 MG tablet Take 1  tablet (10 mg total) by mouth at bedtime. (Patient taking differently: Take 10 mg by mouth daily as needed (allergies). ) 90 tablet 6  . oxyCODONE-acetaminophen (PERCOCET) 7.5-325 MG tablet Take 1-2 tablets by mouth every 6 (six) hours as needed for severe pain. 30 tablet 0  . oxyCODONE-acetaminophen (PERCOCET/ROXICET) 5-325 MG tablet Take 2 tablets by mouth 4 (four) times daily.    . predniSONE (DELTASONE) 20 MG tablet Take 1 tablet (20 mg total) by mouth daily with breakfast. 14 tablet 0  . promethazine (PHENERGAN) 25 MG tablet take 1 tablet by mouth every 8 hours if needed for nausea and vomiting 30 tablet 1  . sertraline (ZOLOFT) 100 MG tablet Take 1 tablet (100 mg total) by mouth daily. Target dose is 150 mg at night.  Per Dr. Tammi Klippel.  #45 with 11 refills. (Patient taking differently: Take 150 mg by mouth daily. ) 30 tablet 10  . triamcinolone ointment (KENALOG) 0.5 % Apply 1 application topically 2 (two) times daily. (Patient taking differently: Apply 1 application topically 2 (two) times daily as needed (rash). ) 30 g 0  . zolpidem (AMBIEN) 10 MG tablet Take 1 tablet (10 mg total) by mouth at bedtime as needed for sleep. 30 tablet 5   No current facility-administered medications for this visit.     Review of Systems  Constitutional:  Constitutional negative. HENT: HENT negative.  Eyes: Eyes negative.  Respiratory: Positive for shortness of breath and wheezing.  Cardiovascular: Positive for irregular heartbeat and leg swelling.  Musculoskeletal: Positive for leg pain.  Skin: Positive for rash.  Neurological: Neurological negative. Hematologic: Hematologic/lymphatic negative.  Psychiatric: Positive for depressed mood.        Objective:  Objective   There were no vitals filed for this visit. There is no height or weight on file to calculate BMI.  Physical Exam  Constitutional: She is oriented to person, place, and time. She appears well-developed.  HENT:  Head: Normocephalic.    Eyes: Pupils are equal, round, and reactive to light.  Neck: Normal range of motion.  Cardiovascular: Normal rate.  Pulses:      Radial pulses are 2+ on the right side, and 2+ on the left side.       Popliteal pulses are 2+ on the right side, and 2+ on the left side.       Dorsalis pedis pulses are 2+ on the left side.  Pulmonary/Chest: Effort normal.  Abdominal: Soft.  Musculoskeletal: Normal range of motion. She exhibits edema.  Neurological: She is alert and oriented to  person, place, and time.  Skin: Skin is warm and dry.  Psychiatric: She has a normal mood and affect. Her behavior is normal. Judgment and thought content normal.    Data: IMPRESSION: 1. The area of increased soft tissue along the left common iliac artery is again noted. This is stable to decreased in volume from previous exam as described above. 2. Left-sided ureteral stent remains in place with stable to improved mild hydronephrosis. 3. Cirrhosis. 4. Splenomegaly. 5. Gallstones.      Assessment/Plan:     62 year old female with nonspecific inflammatory change involving her left common iliac artery.  I do not think this is primarily a vascular process and she appears to be perfusing her bilateral lower extremities well.  As such there is no vascular intervention that is needed at this time.  She can follow-up on a as needed basis.     Waynetta Sandy MD [facility]

## 2017-04-01 DIAGNOSIS — N131 Hydronephrosis with ureteral stricture, not elsewhere classified: Secondary | ICD-10-CM | POA: Diagnosis not present

## 2017-04-01 DIAGNOSIS — N1330 Unspecified hydronephrosis: Secondary | ICD-10-CM | POA: Diagnosis not present

## 2017-04-08 ENCOUNTER — Other Ambulatory Visit: Payer: Self-pay

## 2017-04-08 DIAGNOSIS — I10 Essential (primary) hypertension: Secondary | ICD-10-CM

## 2017-04-08 MED ORDER — HYDROCHLOROTHIAZIDE 25 MG PO TABS: 25.0000 mg | ORAL_TABLET | Freq: Two times a day (BID) | ORAL | 3 refills | Status: DC

## 2017-04-15 DIAGNOSIS — M545 Low back pain: Secondary | ICD-10-CM | POA: Diagnosis not present

## 2017-04-15 DIAGNOSIS — Z79891 Long term (current) use of opiate analgesic: Secondary | ICD-10-CM | POA: Diagnosis not present

## 2017-04-15 DIAGNOSIS — G894 Chronic pain syndrome: Secondary | ICD-10-CM | POA: Diagnosis not present

## 2017-04-22 ENCOUNTER — Other Ambulatory Visit: Payer: Self-pay | Admitting: Gastroenterology

## 2017-04-22 DIAGNOSIS — R140 Abdominal distension (gaseous): Secondary | ICD-10-CM

## 2017-04-22 DIAGNOSIS — K7460 Unspecified cirrhosis of liver: Secondary | ICD-10-CM | POA: Diagnosis not present

## 2017-04-22 DIAGNOSIS — N135 Crossing vessel and stricture of ureter without hydronephrosis: Secondary | ICD-10-CM | POA: Diagnosis not present

## 2017-04-22 DIAGNOSIS — R110 Nausea: Secondary | ICD-10-CM | POA: Diagnosis not present

## 2017-04-23 ENCOUNTER — Other Ambulatory Visit: Payer: Medicare HMO

## 2017-04-24 DIAGNOSIS — N130 Hydronephrosis with ureteropelvic junction obstruction: Secondary | ICD-10-CM | POA: Diagnosis not present

## 2017-04-26 ENCOUNTER — Ambulatory Visit
Admission: RE | Admit: 2017-04-26 | Discharge: 2017-04-26 | Disposition: A | Discharge status: Home / Self Care | Payer: Medicare HMO | Source: Ambulatory Visit | Attending: Gastroenterology | Admitting: Gastroenterology

## 2017-04-26 DIAGNOSIS — R140 Abdominal distension (gaseous): Secondary | ICD-10-CM

## 2017-04-29 ENCOUNTER — Other Ambulatory Visit: Payer: Self-pay | Admitting: Family Medicine

## 2017-04-29 DIAGNOSIS — N1330 Unspecified hydronephrosis: Secondary | ICD-10-CM | POA: Diagnosis not present

## 2017-04-29 DIAGNOSIS — N200 Calculus of kidney: Secondary | ICD-10-CM | POA: Diagnosis not present

## 2017-04-29 DIAGNOSIS — K8020 Calculus of gallbladder without cholecystitis without obstruction: Secondary | ICD-10-CM | POA: Diagnosis not present

## 2017-05-02 DIAGNOSIS — N131 Hydronephrosis with ureteral stricture, not elsewhere classified: Secondary | ICD-10-CM | POA: Diagnosis not present

## 2017-05-02 DIAGNOSIS — N1330 Unspecified hydronephrosis: Secondary | ICD-10-CM | POA: Diagnosis not present

## 2017-05-13 DIAGNOSIS — M545 Low back pain: Secondary | ICD-10-CM | POA: Diagnosis not present

## 2017-05-13 DIAGNOSIS — G894 Chronic pain syndrome: Secondary | ICD-10-CM | POA: Diagnosis not present

## 2017-05-13 DIAGNOSIS — Z79891 Long term (current) use of opiate analgesic: Secondary | ICD-10-CM | POA: Diagnosis not present

## 2017-05-20 DIAGNOSIS — K319 Disease of stomach and duodenum, unspecified: Secondary | ICD-10-CM | POA: Diagnosis not present

## 2017-05-20 DIAGNOSIS — K3189 Other diseases of stomach and duodenum: Secondary | ICD-10-CM | POA: Diagnosis not present

## 2017-05-20 DIAGNOSIS — R110 Nausea: Secondary | ICD-10-CM | POA: Diagnosis not present

## 2017-05-23 ENCOUNTER — Other Ambulatory Visit: Payer: Self-pay

## 2017-05-23 DIAGNOSIS — K319 Disease of stomach and duodenum, unspecified: Secondary | ICD-10-CM | POA: Diagnosis not present

## 2017-05-24 MED ORDER — ALPRAZOLAM 1 MG PO TABS: ORAL_TABLET | ORAL | 3 refills | Status: DC

## 2017-05-24 NOTE — Telephone Encounter (Signed)
Pt calling back requesting this medication be refilled. Pt said she's having really bad anxiety. Please let pt know when this has been approved/sent to pharmacy. Please advise

## 2017-05-29 ENCOUNTER — Other Ambulatory Visit: Payer: Self-pay | Admitting: Family Medicine

## 2017-06-04 ENCOUNTER — Ambulatory Visit: Payer: Medicare HMO | Admitting: Psychology

## 2017-06-04 DIAGNOSIS — F321 Major depressive disorder, single episode, moderate: Secondary | ICD-10-CM

## 2017-06-04 NOTE — Assessment & Plan Note (Signed)
Report of mood is frustrated / anxious / sad regarding this ongoing situation with [name].  She is tearful.  As best we can tell, her tears are about the pressure from the treatment facility to engage with [facility] when [name] has no interest.  She denies feeling guilty about not wanting to have a relationship but instead, feels forced into it.    Had a good discussion about boundaries and getting clear on her roles and responsibilities.  Per [name], she no longer has any legal responsibility for [facility].  The last four years, [name] has attempted to enforce rules including getting treatment facilities, helping professionals, and the police involved.  None of it has worked.  Prior to [facility] getting taken away, she was missing for two weeks.  [name]'s attempts to help her granddaughter have not been effective and the relationship is reportedly harmful to [name]'s well-being.  Encouraged her to set healthy boundaries for herself and [name].  See patient instructions for further plan.  Asked her to call me next week to check-in.  She is scheduled to go down there this Saturday and she will decide whether this is something that she is willing to do.

## 2017-06-04 NOTE — Progress Notes (Signed)
Reason for follow-up:  Stacy Campbell was last seen 02/26/17.  She presents today to continue discussion of the challenge of raising her daughter's daughter.    Issues discussed:  Stacy Campbell was removed from the house at the beginning of April.  She is at a behavioral health facility in [place] which is about four hours away.  She calls twice daily and wants Stacy Campbell to visit along with her brother, Stacy Campbell.  Discussed at length.

## 2017-06-04 NOTE — Patient Instructions (Signed)
Talk to Pleas Stacy Campbell about whether he wants to see his sister (is it important to him?).  If so - consider how he might get there.  We are talking about boundaries - I would use that word with this therapist.  She should understand this concept.  The boundaries you said you wanted to set:  1.  Stacy Campbell can call twice a week to talk to Stacy Campbell.  Other than that, you aren't going to answer the phone.  Pick two days that make sense and a specific time.    2.  Decide whether you want to see Stacy Campbell or talk to her on the phone.  Your involvement with her has not been helpful in the past few years (meaning you haven't been able to help her behave differently) and it has been harmful to you.  At this point, protecting your health is important.  Think about what is best for you.  You deserve that.

## 2017-06-10 DIAGNOSIS — Z79891 Long term (current) use of opiate analgesic: Secondary | ICD-10-CM | POA: Diagnosis not present

## 2017-06-10 DIAGNOSIS — M545 Low back pain: Secondary | ICD-10-CM | POA: Diagnosis not present

## 2017-06-10 DIAGNOSIS — G894 Chronic pain syndrome: Secondary | ICD-10-CM | POA: Diagnosis not present

## 2017-07-05 ENCOUNTER — Ambulatory Visit (INDEPENDENT_AMBULATORY_CARE_PROVIDER_SITE_OTHER): Payer: Medicare HMO | Admitting: Internal Medicine

## 2017-07-05 ENCOUNTER — Other Ambulatory Visit: Payer: Self-pay

## 2017-07-05 VITALS — BP 120/80 | HR 84 | Temp 98.8°F | Ht 65.0 in | Wt 226.4 lb

## 2017-07-05 DIAGNOSIS — R21 Rash and other nonspecific skin eruption: Secondary | ICD-10-CM

## 2017-07-05 MED ORDER — CLOBETASOL PROPIONATE 0.05 % EX CREA: 1.0000 "application " | TOPICAL_CREAM | Freq: Two times a day (BID) | CUTANEOUS | 0 refills | Status: DC | PRN

## 2017-07-05 MED ORDER — PERMETHRIN 5 % EX CREA: TOPICAL_CREAM | CUTANEOUS | 0 refills | Status: DC

## 2017-07-05 NOTE — Patient Instructions (Addendum)
[name],  I think you may have scabies. Please apply permethrin cream and see [place] back if no improvement next week.  You may also want to try a stronger steroid cream. Stop the triamcinolone and try the stronger clobetasol, which I have prescribed.  Continue zyrtec and your reflux medication, as both can help with itching.  Best, Dr. Ola Spurr   Scabies, Adult Scabies is a skin condition that happens when very small insects get under the skin (infestation). This causes a rash and severe itchiness. Scabies can spread from person to person (is contagious). If you get scabies, it is common for others in your household to get scabies too. With proper treatment, symptoms usually go away in 2-4 weeks. Scabies usually does not cause lasting problems. What are the causes? This condition is caused by mites (Sarcoptes scabiei, or human itch mites) that can only be seen with a microscope. The mites get into the top layer of skin and lay eggs. Scabies can spread from person to person through:  Close contact with a person who has scabies.  Contact with infested items, such as towels, bedding, or clothing.  What increases the risk? This condition is more likely to develop in:  People who live in nursing homes and other extended-care facilities.  People who have sexual contact with a partner who has scabies.  Young children who attend child care facilities.  People who care for others who are at increased risk for scabies.  What are the signs or symptoms? Symptoms of this condition may include:  Severe itchiness. This is often worse at night.  A rash that includes tiny red bumps or blisters. The rash commonly occurs on the wrist, elbow, armpit, fingers, waist, groin, or buttocks. Bumps may form a line (burrow) in some areas.  Skin irritation. This can include scaly patches or sores.  How is this diagnosed? This condition is diagnosed with a physical exam. Your health care provider  will look closely at your skin. In some cases, your health care provider may take a sample of your affected skin (skin scraping) and have it examined under a microscope. How is this treated? This condition may be treated with:  Medicated cream or lotion that kills the mites. This is spread on the entire body and left on for several hours. Usually, one treatment with medicated cream or lotion is enough to kill all of the mites. In severe cases, the treatment may be repeated.  Medicated cream that relieves itching.  Medicines that help to relieve itching.  Medicines that kill the mites. This treatment is rarely used.  Follow these instructions at home:  Medicines  Take or apply over-the-counter and prescription medicines as told by your health care provider.  Apply medicated cream or lotion as told by your health care provider.  Do not wash off the medicated cream or lotion until the necessary amount of time has passed. Skin Care  Avoid scratching your affected skin.  Keep your fingernails closely trimmed to reduce injury from scratching.  Take cool baths or apply cool washcloths to help reduce itching. General instructions  Clean all items that you recently had contact with, including bedding, clothing, and furniture. Do this on the same day that your treatment starts. ? Use hot water when you wash items. ? Place unwashable items into closed, airtight plastic bags for at least 3 days. The mites cannot live for more than 3 days away from human skin. ? Vacuum furniture and mattresses that you use.  Make sure that other people who may have been infested are examined by a health care provider. These include members of your household and anyone who may have had contact with infested items.  Keep all follow-up visits as told by your health care provider. This is important. Contact a health care provider if:  You have itching that does not go away after 4 weeks of treatment.  You  continue to develop new bumps or burrows.  You have redness, swelling, or pain in your rash area after treatment.  You have fluid, blood, or pus coming from your rash. This information is not intended to replace advice given to you by your health care provider. Make sure you discuss any questions you have with your health care provider. Document Released: 09/15/2014 Document Revised: 06/02/2015 Document Reviewed: 07/27/2014 Elsevier Interactive Patient Education  [employer].

## 2017-07-05 NOTE — Assessment & Plan Note (Addendum)
-   Persistently itchy rash in distribution concerning for scabies. No new medications to suggest drug eruption rash. No dermatomal distribution to suggest shingles. Patient already on PO steroids for intraabdominal inflammation so did not suggest increase. - Prescribed permethrin cream - Also prescribed stronger steroid of clobetasol to try if no improvement with permethrin - continue zyrtec and reflux medication that may help with itching - Provided handout on scabies - Patient does have dermatology appointment next month

## 2017-07-05 NOTE — Progress Notes (Signed)
[facility] Progress Note  Subjective:  Stacy Campbell is a 62 y.o. 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 Present for 3 weeks. It began on her thighs and has spread to arms, back, hands and abdomen. It has not continued to spread but is increasingly itchy. She has tried benadryl and triamcinolone cream without relief. She denies new medications, changes in soaps or detergents, recent travel, contacts with rash. She reports being seen by an outside doctor in [place] earlier this year (perhaps a rheumatologist) and received treatments for psoriatic arthritis (dupixent, otezla) and doxepin for itching but says current rash is different. Reports itching is worst in the evening. ROS: She denies shortness of breath. Denies fevers.  Allergies  Allergen Reactions  . Hydrocodone Itching  . Aspirin Itching    Lower dose doesn't make her itch (she takes it every day)  . Eggs Or Egg-Derived Products Nausea Only    No reaction if in another food    Social History   Tobacco Use  . Smoking status: Former Smoker    Packs/day: 0.25    Years: 37.00    Pack years: 9.25    Types: Cigarettes    Last attempt to quit: 03/30/2008    Years since quitting: 9.2  . Smokeless tobacco: Never Used  Substance Use Topics  . Alcohol use: No    Objective: Blood pressure 120/80, pulse 84, temperature 98.8 F (37.1 C), temperature source Oral, height 5\' 5"  (1.651 m), weight 226 lb 6.4 oz (102.7 kg). Body mass index is 37.67 kg/m. Constitutional: Pleasant, obese female in NAD HENT: MMM, no oral lesions, dentures in place Pulmonary/Chest: Effort normal and breath sounds normal.  Skin: Erythematous papules over upper back and near underarms (see picture below), inner and outer thighs, upper chest. Few scattered papules on palms.  Psychiatric: Normal mood and affect.  Vitals  reviewed      Assessment/Plan: Rash and nonspecific skin eruption - Persistently itchy rash in distribution concerning for scabies. No new medications to suggest drug eruption rash. No dermatomal distribution to suggest shingles. Patient already on PO steroids for intraabdominal inflammation so did not suggest increase. - Prescribed permethrin cream - Also prescribed stronger steroid of clobetasol to try if no improvement with permethrin - continue zyrtec and reflux medication that may help with itching - Provided handout on scabies - Patient does have dermatology appointment next month   Follow-up next week if no improvement.  Olene Floss, MD [place], PGY-3

## 2017-07-15 DIAGNOSIS — M545 Low back pain: Secondary | ICD-10-CM | POA: Diagnosis not present

## 2017-07-15 DIAGNOSIS — G894 Chronic pain syndrome: Secondary | ICD-10-CM | POA: Diagnosis not present

## 2017-07-15 DIAGNOSIS — Z79891 Long term (current) use of opiate analgesic: Secondary | ICD-10-CM | POA: Diagnosis not present

## 2017-07-29 ENCOUNTER — Other Ambulatory Visit: Payer: Self-pay | Admitting: *Deleted

## 2017-07-30 MED ORDER — ZOLPIDEM TARTRATE 10 MG PO TABS: 10.0000 mg | ORAL_TABLET | Freq: Every evening | ORAL | 5 refills | Status: DC | PRN

## 2017-08-12 DIAGNOSIS — Z79891 Long term (current) use of opiate analgesic: Secondary | ICD-10-CM | POA: Diagnosis not present

## 2017-08-12 DIAGNOSIS — M545 Low back pain: Secondary | ICD-10-CM | POA: Diagnosis not present

## 2017-08-12 DIAGNOSIS — G894 Chronic pain syndrome: Secondary | ICD-10-CM | POA: Diagnosis not present

## 2017-08-13 ENCOUNTER — Other Ambulatory Visit: Payer: Self-pay | Admitting: Gastroenterology

## 2017-08-13 DIAGNOSIS — Z1211 Encounter for screening for malignant neoplasm of colon: Secondary | ICD-10-CM | POA: Diagnosis not present

## 2017-08-13 DIAGNOSIS — K7460 Unspecified cirrhosis of liver: Secondary | ICD-10-CM | POA: Diagnosis not present

## 2017-08-13 DIAGNOSIS — R110 Nausea: Secondary | ICD-10-CM | POA: Diagnosis not present

## 2017-08-16 ENCOUNTER — Ambulatory Visit
Admission: RE | Admit: 2017-08-16 | Discharge: 2017-08-16 | Disposition: A | Discharge status: Home / Self Care | Payer: Medicare HMO | Source: Ambulatory Visit | Attending: Gastroenterology | Admitting: Gastroenterology

## 2017-08-16 DIAGNOSIS — K7460 Unspecified cirrhosis of liver: Secondary | ICD-10-CM | POA: Diagnosis not present

## 2017-08-20 ENCOUNTER — Other Ambulatory Visit: Payer: Self-pay

## 2017-08-20 ENCOUNTER — Encounter: Payer: Self-pay | Admitting: Family Medicine

## 2017-08-20 ENCOUNTER — Ambulatory Visit (INDEPENDENT_AMBULATORY_CARE_PROVIDER_SITE_OTHER): Payer: Medicare HMO | Admitting: Family Medicine

## 2017-08-20 ENCOUNTER — Other Ambulatory Visit (HOSPITAL_COMMUNITY): Payer: Self-pay | Admitting: Gastroenterology

## 2017-08-20 ENCOUNTER — Ambulatory Visit (HOSPITAL_COMMUNITY)
Admission: RE | Admit: 2017-08-20 | Discharge: 2017-08-20 | Disposition: A | Discharge status: Home / Self Care | Payer: Medicare HMO | Source: Ambulatory Visit | Attending: Family Medicine | Admitting: Family Medicine

## 2017-08-20 VITALS — BP 126/70 | HR 94 | Temp 98.2°F | Ht 65.0 in | Wt 224.0 lb

## 2017-08-20 DIAGNOSIS — I700 Atherosclerosis of aorta: Secondary | ICD-10-CM | POA: Insufficient documentation

## 2017-08-20 DIAGNOSIS — R109 Unspecified abdominal pain: Secondary | ICD-10-CM

## 2017-08-20 DIAGNOSIS — R300 Dysuria: Secondary | ICD-10-CM | POA: Diagnosis not present

## 2017-08-20 DIAGNOSIS — K7460 Unspecified cirrhosis of liver: Secondary | ICD-10-CM | POA: Insufficient documentation

## 2017-08-20 DIAGNOSIS — R42 Dizziness and giddiness: Secondary | ICD-10-CM | POA: Insufficient documentation

## 2017-08-20 DIAGNOSIS — R140 Abdominal distension (gaseous): Secondary | ICD-10-CM | POA: Diagnosis not present

## 2017-08-20 DIAGNOSIS — R161 Splenomegaly, not elsewhere classified: Secondary | ICD-10-CM | POA: Insufficient documentation

## 2017-08-20 DIAGNOSIS — K766 Portal hypertension: Secondary | ICD-10-CM | POA: Insufficient documentation

## 2017-08-20 DIAGNOSIS — K820 Obstruction of gallbladder: Secondary | ICD-10-CM

## 2017-08-20 DIAGNOSIS — K8020 Calculus of gallbladder without cholecystitis without obstruction: Secondary | ICD-10-CM | POA: Diagnosis not present

## 2017-08-20 LAB — POCT UA - MICROSCOPIC ONLY

## 2017-08-20 LAB — POCT URINALYSIS DIP (MANUAL ENTRY)
BILIRUBIN UA: NEGATIVE mg/dL
Bilirubin, UA: NEGATIVE
Glucose, UA: NEGATIVE mg/dL
NITRITE UA: NEGATIVE
Spec Grav, UA: 1.015 (ref 1.010–1.025)
UROBILINOGEN UA: 0.2 U/dL
pH, UA: 6 (ref 5.0–8.0)

## 2017-08-20 MED ORDER — CEPHALEXIN 500 MG PO CAPS: 500.0000 mg | ORAL_CAPSULE | Freq: Two times a day (BID) | ORAL | 0 refills | Status: DC

## 2017-08-20 MED ORDER — MECLIZINE HCL 25 MG PO TABS
25.0000 mg | ORAL_TABLET | Freq: Three times a day (TID) | ORAL | 1 refills | Status: DC | PRN
Start: 2017-08-20 — End: 2017-12-24

## 2017-08-20 NOTE — Assessment & Plan Note (Signed)
Hx or retroperitoneal fibrosis with uretal stent, scheduled for f/u end of sept.  Large leuks on UA, will order keflex

## 2017-08-20 NOTE — Assessment & Plan Note (Addendum)
Patient w/ complex abdominal hx and recent US from GI indicating cholelithiases and cirrhosis now with belly distention and nonspecific belly pain  Spent >79min on phone with [facility] office on phone with ancillary staff who were communicating with Dr. Alessandra Bevels as her primary (Dr. Caryl Comes) was out of town.  After hearing about the case they advised HIDA scan and said they would put orders in, they decline to see the patient in the office as a rapid followup and said they would contact the patient to discuss results of HIDA after it had been performed.  We were told that if we thought something needed to be done faster that an [place] or CT was suggested.  We confirmed they would order HIDA, we ordered stat US and arranged for f/u with [place] on Thursday so that patient could be evaluated again.  She was given specific return instructions to go to ED if anything got significantly worse.

## 2017-08-20 NOTE — Assessment & Plan Note (Signed)
No changes in chronic problem, no falls, no new headaches  Refill chronic meclizine

## 2017-08-20 NOTE — Progress Notes (Signed)
Subjective:  Stacy Campbell is a 62 y.o. female who presents to the [facility] today with a chief complaint of belly pain.   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  She follows with nephrology who told her her stent is not working and she is supposed to see them end of September for followup.  She also follows with GI and they ordered a recent RUQ [place] which showed cirrhosis and cholelithiasis.  We cannot see their notes in epic from their visit.  Pain is "hurts pretty bad" but doesn't put a number to it.  It has been slowly progressive over the last few weeks.  She has dysuria but no urgency/frequency changes.  She has not noticed any blood/odor.  She has not had any constipation or diarrhea complaints and says GI was working her up for "gastro-something".  She has not made any medicine changes.  Objective:  Physical Exam: BP 126/70   Pulse 94   Temp 98.2 F (36.8 C) (Oral)   Ht 5\' 5"  (1.651 m)   Wt 224 lb (101.6 kg)   SpO2 99%   BMI 37.28 kg/m   Gen: NAD, resting comfortably CV: RRR with no murmurs appreciated Pulm: NWOB, CTAB with no crackles, wheezes, or rhonchi GI: distended but not firm belly, diffusely mildly tender to palpation, no specific focal pain and no bruising, mildly tympanic on left MSK: no edema, cyanosis, or clubbing noted Skin: warm, dry Neuro: grossly normal, moves all extremities Psych: Normal affect and thought content  Results for orders placed or performed in visit on 08/20/17 (from the past 72 hour(s))  POCT urinalysis dipstick     Status: Abnormal   Collection Time: 08/20/17  8:45 AM  Result Value Ref Range   Color, UA yellow yellow   Clarity, UA cloudy (A) clear   Glucose, UA negative negative mg/dL   Bilirubin, UA negative negative   Ketones, POC UA negative negative mg/dL   Spec Grav, UA 1.015 1.010 - 1.025   Blood, UA moderate (A) negative   pH, UA 6.0 5.0 -  8.0   Protein Ur, POC =100 (A) negative mg/dL   Urobilinogen, UA 0.2 0.2 or 1.0 E.U./dL   Nitrite, UA Negative Negative   Leukocytes, UA Large (3+) (A) Negative  POCT UA - Microscopic Only     Status: Abnormal   Collection Time: 08/20/17  8:45 AM  Result Value Ref Range   WBC, Ur, HPF, POC TOO MANY TO COUNT    RBC, urine, microscopic PRESENT     Comment: unable to count due to the # of WBC's   Bacteria, U Microscopic FEW     Comment: chains of cocci present   Epithelial cells, urine per micros 1-5      Assessment/Plan:  Dysuria Hx or retroperitoneal fibrosis with uretal stent, scheduled for f/u end of sept.  Large leuks on UA, will order keflex  Dizziness No changes in chronic problem, no falls, no new headaches  Refill chronic meclizine  Belly pain Patient w/ complex abdominal hx and recent US from GI indicating cholelithiases and cirrhosis now with belly distention and nonspecific belly pain  Spent >47min on phone with [facility] office on phone with ancillary staff who were communicating with Dr. Alessandra Bevels as her primary (Dr. Caryl Comes) was out of town.  After hearing about the case they advised HIDA scan and said they would put orders in, they decline  to see the patient in the office as a rapid followup and said they would contact the patient to discuss results of HIDA after it had been performed.  We were told that if we thought something needed to be done faster that an [place] or CT was suggested.  We confirmed they would order HIDA, we ordered stat US and arranged for f/u with [place] on Thursday so that patient could be evaluated again.  She was given specific return instructions to go to ED if anything got significantly worse.   Sherene Sires, DO FAMILY MEDICINE RESIDENT - PGY2 08/21/2017 9:17 AM

## 2017-08-20 NOTE — Patient Instructions (Signed)
It was a pleasure to see you today! Thank you for choosing [facility] for your primary care. Stacy Campbell was seen for belly pain/distention. Come back to the clinic on Thursday at 1:30pm to see Dr. Criss Rosales, and go to the emergency room if your pain gets significantly worse.   We'd like you to take 2 of the 10mg  prednisone pills for the next few days until we get through this belly pain issue.  We're ordering some keflex which is an antibiotic for your urinary symptoms.  We're sending you for an [place] at 11:00am at [facility] and you will followup with Dr. Criss Rosales on Thursday at 1:30pm.  Your GI doctor should be calling you about a HIDA scan.  If you don't hear from them by Thursday, we will discuss it then and try to determine your appt time.   Please bring all your medications to every doctors visit   Sign up for My Chart to have easy access to your labs results, and communication with your Primary care physician.     Please check-out at the front desk before leaving the clinic.     Best,  Dr. Sherene Sires FAMILY MEDICINE RESIDENT - PGY2 08/20/2017 9:36 AM

## 2017-08-22 ENCOUNTER — Encounter: Payer: Self-pay | Admitting: Family Medicine

## 2017-08-22 ENCOUNTER — Ambulatory Visit (INDEPENDENT_AMBULATORY_CARE_PROVIDER_SITE_OTHER): Payer: Medicare HMO | Admitting: Family Medicine

## 2017-08-22 ENCOUNTER — Other Ambulatory Visit: Payer: Self-pay

## 2017-08-22 VITALS — BP 146/78 | HR 89 | Temp 98.7°F | Ht 65.0 in | Wt 224.2 lb

## 2017-08-22 DIAGNOSIS — R109 Unspecified abdominal pain: Secondary | ICD-10-CM | POA: Diagnosis not present

## 2017-08-22 NOTE — Patient Instructions (Signed)
It was a pleasure to see you today! Thank you for choosing [facility] for your primary care. Stacy Campbell was seen for followup for belly distention/pain. Come back to the clinic if you have any more problems before you see GI or urology, and go to the emergency room if you have any life threatening problems.    Please bring all your medications to every doctors visit   Sign up for My Chart to have easy access to your labs results, and communication with your Primary care physician.     Please check-out at the front desk before leaving the clinic.     Best,  Dr. Sherene Sires FAMILY MEDICINE RESIDENT - PGY2 08/22/2017 2:20 PM

## 2017-08-22 NOTE — Progress Notes (Signed)
    Subjective:  Stacy Campbell is a 62 y.o. female who presents to the [facility] today with a chief complaint of followup for belly pain/distention.   HPI: Patient with complicated abdominal hx (retroperitoneal fibrosis/renal stent/cirrhosis) was in clinic 2 days ago for belly pain with mild distention.  Was found to have UTI, GI was called on phone and did not think they needed to evaluate her in clinic urgently so we sent to hospital for abdominal US which was negative for acute process.  She has been taking keflex as prescribed and is feeling improved and almost at baseline per her.   She is presenting today because we asked her to do so.  She does have HIDA scan scheduled with GI and commits to making her appt.  She has no new requests or complaints for [place] today.   Objective:  Physical Exam: BP (!) 146/78   Pulse 89   Temp 98.7 F (37.1 C) (Oral)   Ht 5\' 5"  (1.651 m)   Wt 224 lb 3.2 oz (101.7 kg)   SpO2 97%   BMI 37.31 kg/m   Gen: NAD, resting comfortably CV: RRR with no murmurs appreciated Pulm: NWOB, CTAB with no crackles, wheezes, or rhonchi GI: Normal bowel sounds present. Soft, Nontender, Nondistended. MSK: no edema, cyanosis, or clubbing noted Skin: warm, dry Neuro: grossly normal, moves all extremities Psych: Normal affect and thought content  No results found for this or any previous visit (from the past 72 hour(s)).   Assessment/Plan:  Belly pain Pain improved and now largely at baseline.  No urinary symptoms any more, she will continue keflex course and go to HIDA appt.   Sherene Sires, DO FAMILY MEDICINE RESIDENT - PGY2 08/26/2017 7:24 AM

## 2017-08-23 DIAGNOSIS — L0889 Other specified local infections of the skin and subcutaneous tissue: Secondary | ICD-10-CM | POA: Diagnosis not present

## 2017-08-23 DIAGNOSIS — L2089 Other atopic dermatitis: Secondary | ICD-10-CM | POA: Diagnosis not present

## 2017-08-26 NOTE — Assessment & Plan Note (Signed)
Pain improved and now largely at baseline.  No urinary symptoms any more, she will continue keflex course and go to HIDA appt.

## 2017-08-30 ENCOUNTER — Encounter (HOSPITAL_COMMUNITY)
Admission: RE | Admit: 2017-08-30 | Discharge: 2017-08-30 | Disposition: A | Discharge status: Home / Self Care | Payer: Medicare HMO | Source: Ambulatory Visit | Attending: Gastroenterology | Admitting: Gastroenterology

## 2017-08-30 DIAGNOSIS — K8020 Calculus of gallbladder without cholecystitis without obstruction: Secondary | ICD-10-CM | POA: Diagnosis not present

## 2017-08-30 DIAGNOSIS — K820 Obstruction of gallbladder: Secondary | ICD-10-CM | POA: Diagnosis not present

## 2017-08-30 MED ORDER — MORPHINE SULFATE (PF) 4 MG/ML IV SOLN: 4.0000 mg | Freq: Once | INTRAVENOUS | Status: DC

## 2017-08-30 MED ORDER — MORPHINE SULFATE (PF) 4 MG/ML IV SOLN
INTRAVENOUS | Status: AC
  Administered 2017-08-30: 10:00:00
  Filled 2017-08-30: qty 1

## 2017-08-30 MED ORDER — TECHNETIUM TC 99M MEBROFENIN IV KIT
7.4000 | PACK | Freq: Once | INTRAVENOUS | Status: AC | PRN
  Administered 2017-08-30: 7.4 via INTRAVENOUS

## 2017-09-02 ENCOUNTER — Other Ambulatory Visit: Payer: Self-pay

## 2017-09-02 MED ORDER — SERTRALINE HCL 100 MG PO TABS: 100.0000 mg | ORAL_TABLET | Freq: Every day | ORAL | 3 refills | Status: DC

## 2017-09-09 ENCOUNTER — Telehealth: Payer: Self-pay | Admitting: Family Medicine

## 2017-09-09 NOTE — Telephone Encounter (Signed)
**  After Hours/ Emergency Line Call**  Received a call to report that Jaconita having bumps on hands. She noticed some "big white bumps on her hands that flare up and then go away and come back" that are itchy. She states looks like "white heads." She is on some antibiotics and prednisone. Has been trying to triamcinolone cream without any relief.  Denying SOB, lip/throat swelling. She would to be evaluated for this tomorrow.  Red flags discussed. Scheduled with Dr. Andria Frames.  Will forward to PCP as well.  Bufford Lope, DO PGY-3, [facility] 09/09/2017 3:59 PM

## 2017-09-10 ENCOUNTER — Encounter: Payer: Self-pay | Admitting: Family Medicine

## 2017-09-10 ENCOUNTER — Ambulatory Visit (INDEPENDENT_AMBULATORY_CARE_PROVIDER_SITE_OTHER): Payer: Medicare HMO | Admitting: Family Medicine

## 2017-09-10 ENCOUNTER — Other Ambulatory Visit: Payer: Self-pay

## 2017-09-10 DIAGNOSIS — Z23 Encounter for immunization: Secondary | ICD-10-CM

## 2017-09-10 DIAGNOSIS — L301 Dyshidrosis [pompholyx]: Secondary | ICD-10-CM

## 2017-09-10 MED ORDER — CLOBETASOL PROPIONATE 0.05 % EX CREA: 1.0000 "application " | TOPICAL_CREAM | Freq: Two times a day (BID) | CUTANEOUS | 3 refills | Status: DC | PRN

## 2017-09-10 NOTE — Assessment & Plan Note (Addendum)
Patient with unimproving vesicular rash to ventral and dorsal surfaces of hands and wrists bilaterally for two days. Prescribed clobetasol 0.05% to be applied 2 times a day. Discussed avoiding potentially offending agents + applying cream and wearing gloves to bed at night for better penetration of the medication.

## 2017-09-10 NOTE — Progress Notes (Signed)
Subjective:   Patient ID: Stacy Campbell    DOB: 1955/08/15, 62 y.o. female   MRN: [id number]  Chief Complaint: rash on hands  History of Present Illness: Stacy Campbell is a 62 y.o. woman with history of asthma and eczema who presents to clinic today for rash to wrisst and hands for two days now. Patient describes pruritic, burning rash aggravated by washing the dishes, cooking and wearing latex gloves. Patient denies history of similar rash. She reports treatment with Benadryl and clobetasol cream given to her at a previous appointment 07/05/2017, without improvement.  Patient denies fever, recent cold symptoms, nausea or vomiting. Patients lives with grandson and great nephew and brother; no one at home with similar rash.       Review of Systems  Constitutional: Positive for fever. Negative for chills.  Skin: Negative for rash.    Current Outpatient Medications  Medication Sig Dispense Refill  . albuterol (PROVENTIL HFA;VENTOLIN HFA) 108 (90 Base) MCG/ACT inhaler Inhale 2 puffs into the lungs every 6 (six) hours as needed for wheezing or shortness of breath. 1 Inhaler 2  . ALPRAZolam (XANAX) 1 MG tablet take 1 tablet by mouth twice a day if needed for anxiety 30 tablet 3  . amLODipine (NORVASC) 10 MG tablet take 1 tablet by mouth once daily 90 tablet 3  . ARIPiprazole (ABILIFY) 5 MG tablet TAKE 1 TABLET BY MOUTH DAILY 90 tablet 0  . benzonatate (TESSALON) 100 MG capsule Take 1 capsule (100 mg total) by mouth 3 (three) times daily as needed for cough. 30 capsule 3  . carvedilol (COREG) 25 MG tablet Take 1 tablet (25 mg total) by mouth 2 (two) times daily with a meal. 180 tablet 3  . cephALEXin (KEFLEX) 500 MG capsule Take 1 capsule (500 mg total) by mouth 2 (two) times daily. (Patient not taking: Reported on 08/20/2017) 14 capsule 0  . clobetasol cream (TEMOVATE) 0.34 % Apply 1 application topically 2 (two) times daily as needed. (Patient not taking: Reported on 08/20/2017) 30 g  0  . docusate sodium (COLACE) 250 MG capsule Take 1 capsule (250 mg total) by mouth daily. (Patient not taking: Reported on 07/05/2017) 10 capsule 0  . Famotidine 20 MG CHEW Chew 1 tablet (20 mg total) by mouth 2 (two) times daily. (Patient not taking: Reported on 07/05/2017) 60 each 2  . hydrochlorothiazide (HYDRODIURIL) 25 MG tablet Take 1 tablet (25 mg total) by mouth 2 (two) times daily. 90 tablet 3  . meclizine (ANTIVERT) 25 MG tablet Take 1 tablet (25 mg total) by mouth 3 (three) times daily as needed for dizziness. 30 tablet 1  . montelukast (SINGULAIR) 10 MG tablet Take 1 tablet (10 mg total) by mouth at bedtime. (Patient taking differently: Take 10 mg by mouth daily as needed (allergies). ) 90 tablet 6  . oxycodone (OXY-IR) 5 MG capsule Take 10 mg by mouth every 4 (four) hours as needed.    Marland Kitchen oxyCODONE-acetaminophen (PERCOCET) 7.5-325 MG tablet Take 1-2 tablets by mouth every 6 (six) hours as needed for severe pain. (Patient not taking: Reported on 07/05/2017) 30 tablet 0  . oxyCODONE-acetaminophen (PERCOCET/ROXICET) 5-325 MG tablet Take 2 tablets by mouth 4 (four) times daily.    . permethrin (ELIMITE) 5 % cream Apply from neck down over entire body and leave on for 8-14 hours then rinse off. (Patient not taking: Reported on 08/20/2017) 60 g 0  . promethazine (PHENERGAN) 25 MG tablet take 1 tablet by mouth every 8  hours if needed for nausea and vomiting 30 tablet 1  . sertraline (ZOLOFT) 100 MG tablet Take 1 tablet (100 mg total) by mouth daily. Target dose is 150 mg at night.  Per Dr. Tammi Klippel.  #45 with 11 refills. 90 tablet 3  . tiZANidine (ZANAFLEX) 4 MG tablet Take 4 mg by mouth 2 (two) times daily.    Marland Kitchen triamcinolone ointment (KENALOG) 0.5 % Apply 1 application topically 2 (two) times daily. (Patient taking differently: Apply 1 application topically 2 (two) times daily as needed (rash). ) 30 g 0  . zolpidem (AMBIEN) 10 MG tablet Take 1 tablet (10 mg total) by mouth at bedtime as needed for  sleep. 30 tablet 5   No current facility-administered medications for this visit.     Objective:   BP 132/78   Pulse 91   Temp 98.5 F (36.9 C) (Oral)   Ht 5\' 5"  (1.651 m)   Wt 225 lb 9.6 oz (102.3 kg)   SpO2 98%   BMI 37.54 kg/m   Physical Exam  Constitutional: She appears well-developed and well-nourished.  Cardiovascular: Normal rate.  Pulmonary/Chest: Effort normal.  Skin: Rash noted.  vesicular rash to ventral and dorsal wrist and dorsal and palmar hands bilaterally, consisting small, scattered lesions; scale to older lesions  Nursing note and vitals reviewed.   Assessment & Plan:  Stacy Campbell is a 62 y.o. woman who presents to clinic today for unimproving vesicular rash to ventral and dorsal surfaces of hands and wrists bilaterally for two days now.   Dyshidrotic eczema Patient with unimproving vesicular rash to ventral and dorsal surfaces of hands and wrists bilaterally for two days. Prescribed clobetasol 0.05% to be applied 2 times a day. Discussed avoiding potentially offending agents + applying cream and wearing gloves to bed at night for better penetration of the medication.   Orders Placed This Encounter  Procedures  . Flu Vaccine QUAD 36+ mos IM   Meds ordered this encounter  Medications  . clobetasol cream (TEMOVATE) 0.05 %    Sig: Apply 1 application topically 2 (two) times daily as needed. Only use on hands    Dispense:  60 g    Refill:  [facility] 09/10/2017 3:42 PM  I was physically present and or repeated all elements of the H&PE by [place].  Agree with her documentation.  Ms. Procter has a bilateral hand rash with is painful and consistent with dyshidrotic eczema.  She needs high dose topical steroids.  Also discuss best TLC for her hands.

## 2017-09-10 NOTE — Patient Instructions (Signed)
The problem is Dyshidrotic eczema.  We need to use a high potency steroid on your hands.  Do not use that cream anyplace else - only on your hands.

## 2017-09-11 ENCOUNTER — Encounter: Payer: Self-pay | Admitting: Family Medicine

## 2017-09-11 DIAGNOSIS — G894 Chronic pain syndrome: Secondary | ICD-10-CM | POA: Diagnosis not present

## 2017-09-11 DIAGNOSIS — Z79891 Long term (current) use of opiate analgesic: Secondary | ICD-10-CM | POA: Diagnosis not present

## 2017-09-11 DIAGNOSIS — M545 Low back pain: Secondary | ICD-10-CM | POA: Diagnosis not present

## 2017-09-12 ENCOUNTER — Ambulatory Visit: Payer: Self-pay | Admitting: Surgery

## 2017-09-12 DIAGNOSIS — I4891 Unspecified atrial fibrillation: Secondary | ICD-10-CM | POA: Diagnosis not present

## 2017-09-12 DIAGNOSIS — K7460 Unspecified cirrhosis of liver: Secondary | ICD-10-CM | POA: Diagnosis not present

## 2017-09-12 DIAGNOSIS — K811 Chronic cholecystitis: Secondary | ICD-10-CM | POA: Diagnosis not present

## 2017-09-12 NOTE — H&P (Signed)
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 Paulita Fujita for chronic cholecystitis.  She has been having persistent nausea, decreased appetite, and occasional postprandial right upper quadrant pain which is relatively new.  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  Her other complaints include bloating and increased abdominal girth, constipation.  No vomiting.  No melena or hematochezia. Recent labs are essentially normal including her INR 1.1, creatinine 0.75, sodium 138, bicarb 28, albumin 3.7, alk phos 6646, ALT 35, total bilirubin 0.3, white count 5.1, globin 11.9, platelets 219 Right upper quadrant ultrasound demonstrates gallstones and contracted gallbladder, no ascites.  She does have splenomegaly.  HIDA scan demonstrates nonvisualization of the gallbladder.  Most recent upper endoscopy was a few months ago and did not show any varices. She has not had any prior abdominal surgery  Allergies  Allergen Reactions  . Hydrocodone Itching  . Aspirin Itching    Lower dose doesn't make her itch (she takes it every day)  . Eggs Or Egg-Derived Products Nausea Only    No reaction if in another food    Past Medical History:  Diagnosis Date  . Anxiety   . Arthritis    "lower back" (01/29/2017)  . Asthma   . Bone spur    spine  . Chronic lower back pain   . Depression   . Eczema   . GERD (gastroesophageal reflux disease)   . Headache    "at least weekly" (01/29/2017)  . History of atrial fibrillation without current medication    cardioversion in 1990s  . Hypertension   . Insomnia     Past Surgical History:  Procedure Laterality Date  . CARDIOVERSION  1990s  . CYSTOSCOPY  W/ URETERAL STENT PLACEMENT Left 01/29/2017   Procedure: CYSTOSCOPY WITH RETROGRADE PYELOGRAM/URETERAL STENT PLACEMENT;  Surgeon: Ardis Hughs, MD;  Location: [facility];  Service: Urology;  Laterality: Left;  . CYSTOSCOPY WITH RETROGRADE PYELOGRAM, URETEROSCOPY AND STENT PLACEMENT Left 01/29/2017   CYSTOSCOPY WITH RETROGRADE PYELOGRAM/URETERAL STENT PLACEMENT    Family History  Problem Relation Age of Onset  . Asthma Mother   . Diabetes Mother   . Asthma Father   . Diabetes Father   . Cancer Father        prostate  . Hepatitis B Daughter   . Breast cancer Neg Hx     Social History   Socioeconomic History  . Marital status: Widowed    Spouse name: Not on file  . Number of children: Not on file  . Years of education: Not on file  . Highest education level: Not on file  Occupational History  . Not on file  Social Needs  . Financial resource strain: Not on file  . Food insecurity:    Worry: Not on file    Inability: Not on file  . Transportation needs:    Medical: Not on file    Non-medical: Not on file  Tobacco Use  . Smoking status: Former Smoker    Packs/day: 0.25    Years: 37.00    Pack years: 9.25    Types: Cigarettes    Last attempt to quit: 03/30/2008    Years since quitting: 9.4  .  Smokeless tobacco: Never Used  Substance and Sexual Activity  . Alcohol use: No  . Drug use: No  . Sexual activity: Not on file  Lifestyle  . Physical activity:    Days per week: Not on file    Minutes per session: Not on file  . Stress: Not on file  Relationships  . Social connections:    Talks on phone: Not on file    Gets together: Not on file    Attends religious service: Not on file    Active member of club or organization: Not on file    Attends meetings of clubs or organizations: Not on file    Relationship status: Not on file  Other Topics Concern  . Not on file  Social History Narrative  . Not on file    Current Outpatient Medications on File Prior to Visit   Medication Sig Dispense Refill  . albuterol (PROVENTIL HFA;VENTOLIN HFA) 108 (90 Base) MCG/ACT inhaler Inhale 2 puffs into the lungs every 6 (six) hours as needed for wheezing or shortness of breath. 1 Inhaler 2  . ALPRAZolam (XANAX) 1 MG tablet take 1 tablet by mouth twice a day if needed for anxiety 30 tablet 3  . amLODipine (NORVASC) 10 MG tablet take 1 tablet by mouth once daily 90 tablet 3  . ARIPiprazole (ABILIFY) 5 MG tablet TAKE 1 TABLET BY MOUTH DAILY 90 tablet 0  . benzonatate (TESSALON) 100 MG capsule Take 1 capsule (100 mg total) by mouth 3 (three) times daily as needed for cough. 30 capsule 3  . carvedilol (COREG) 25 MG tablet Take 1 tablet (25 mg total) by mouth 2 (two) times daily with a meal. 180 tablet 3  . cephALEXin (KEFLEX) 500 MG capsule Take 1 capsule (500 mg total) by mouth 2 (two) times daily. (Patient not taking: Reported on 08/20/2017) 14 capsule 0  . clobetasol cream (TEMOVATE) 1.27 % Apply 1 application topically 2 (two) times daily as needed. Only use on hands 60 g 3  . docusate sodium (COLACE) 250 MG capsule Take 1 capsule (250 mg total) by mouth daily. (Patient not taking: Reported on 07/05/2017) 10 capsule 0  . Famotidine 20 MG CHEW Chew 1 tablet (20 mg total) by mouth 2 (two) times daily. (Patient not taking: Reported on 07/05/2017) 60 each 2  . hydrochlorothiazide (HYDRODIURIL) 25 MG tablet Take 1 tablet (25 mg total) by mouth 2 (two) times daily. 90 tablet 3  . meclizine (ANTIVERT) 25 MG tablet Take 1 tablet (25 mg total) by mouth 3 (three) times daily as needed for dizziness. 30 tablet 1  . montelukast (SINGULAIR) 10 MG tablet Take 1 tablet (10 mg total) by mouth at bedtime. (Patient taking differently: Take 10 mg by mouth daily as needed (allergies). ) 90 tablet 6  . oxycodone (OXY-IR) 5 MG capsule Take 10 mg by mouth every 4 (four) hours as needed.    Marland Kitchen oxyCODONE-acetaminophen (PERCOCET) 7.5-325 MG tablet Take 1-2 tablets by mouth every 6 (six) hours as needed for  severe pain. (Patient not taking: Reported on 07/05/2017) 30 tablet 0  . oxyCODONE-acetaminophen (PERCOCET/ROXICET) 5-325 MG tablet Take 2 tablets by mouth 4 (four) times daily.    . permethrin (ELIMITE) 5 % cream Apply from neck down over entire body and leave on for 8-14 hours then rinse off. (Patient not taking: Reported on 08/20/2017) 60 g 0  . promethazine (PHENERGAN) 25 MG tablet take 1 tablet by mouth every 8 hours if needed for nausea and  vomiting 30 tablet 1  . sertraline (ZOLOFT) 100 MG tablet Take 1 tablet (100 mg total) by mouth daily. Target dose is 150 mg at night.  Per Dr. Tammi Klippel.  #45 with 11 refills. 90 tablet 3  . tiZANidine (ZANAFLEX) 4 MG tablet Take 4 mg by mouth 2 (two) times daily.    Marland Kitchen triamcinolone ointment (KENALOG) 0.5 % Apply 1 application topically 2 (two) times daily. (Patient taking differently: Apply 1 application topically 2 (two) times daily as needed (rash). ) 30 g 0  . zolpidem (AMBIEN) 10 MG tablet Take 1 tablet (10 mg total) by mouth at bedtime as needed for sleep. 30 tablet 5   No current facility-administered medications on file prior to visit.     Review of Systems: a complete, 10pt review of systems was completed with pertinent positives and negatives as documented in the HPI  Physical Exam: BMI is 37.61. Gen: alert and chronically ill appearing Eye: extraocular motion intact, no scleral icterus ENT: moist mucus membranes, dentition intact Neck: no mass or thyromegaly Chest: unlabored respirations, symmetrical air entry, clear bilaterally CV: regular rate and rhythm, mild bilateral nonpitting lower extremity edema Abdomen: soft, obese, mildly diffusely tender, nondistended. no caput medusa. No mass or organomegaly MSK: strength symmetrical throughout, no deformity Neuro: grossly intact, normal gait Psych: normal mood and affect, appropriate insight Skin: warm and dry, no rash or lesion on limited exam   CBC Latest Ref Rng & Units 02/01/2017  01/30/2017 01/29/2017  WBC 4.0 - 10.5 K/uL 5.2 4.1 3.8(L)  Hemoglobin 12.0 - 15.0 g/dL 13.5 13.0 12.4  Hematocrit 36.0 - 46.0 % 43.4 40.7 39.5  Platelets 150 - 400 K/uL 227 184 155    CMP Latest Ref Rng & Units 02/01/2017 01/30/2017 01/29/2017  Glucose 65 - 99 mg/dL 122(H) 133(H) 116(H)  BUN 6 - 20 mg/dL _0 Creatinine 0.44 - 1.00 mg/dL 0.87 1.17(H) 1.11(H)  Sodium 135 - 145 mmol/L 137 140 139  Potassium 3.5 - 5.1 mmol/L 3.3(L) 4.0 3.4(L)  Chloride 101 - 111 mmol/L 96(L) 101 105  CO2 22 - 32 mmol/L _1 Calcium 8.9 - 10.3 mg/dL 9.1 8.7(L) 9.1  Total Protein 6.5 - 8.1 g/dL - - 7.7  Total Bilirubin 0.3 - 1.2 mg/dL - - 0.6  Alkaline Phos 38 - 126 U/L - - 84  AST 15 - 41 U/L - - 43(H)  ALT 14 - 54 U/L - - 38    Lab Results  Component Value Date   INR 1.11 10/08/2011   INR 1.08 08/15/2011   INR 1.10 07/26/2011    Imaging: No results found.   A/P: Chronic cholecystitis. She has known stones and non-filling of the gallbladder on HIDA. RUQ pain and nausea. I advised that the constipation/ bloating are not likely to resolve following cholecystectomy; some of this may be secondary to steroid use.  I recommend proceeding with laparoscopic cholecystectomy with possible cholangiogram. Discussed risks of surgery including bleeding, pain, scarring, intraabdominal injury specifically to the common bile duct and sequelae, conversion to open surgery, failure to resolve symptoms, blood clots/ pulmonary embolus, heart attack, pneumonia, stroke, death. She is at increased risk due to her cirrhosis (albeit well compensated) and other medical problems. Will need cardiac clearance.  Questions welcomed and answered to patient's satisfaction.    Romana Juniper, MD [facility], [place] Pager [phone]

## 2017-09-18 ENCOUNTER — Other Ambulatory Visit: Payer: Self-pay | Admitting: Family Medicine

## 2017-10-01 DIAGNOSIS — N131 Hydronephrosis with ureteral stricture, not elsewhere classified: Secondary | ICD-10-CM | POA: Diagnosis not present

## 2017-10-03 ENCOUNTER — Ambulatory Visit: Payer: Medicare HMO | Admitting: Cardiology

## 2017-10-09 DIAGNOSIS — Z79891 Long term (current) use of opiate analgesic: Secondary | ICD-10-CM | POA: Diagnosis not present

## 2017-10-09 DIAGNOSIS — G894 Chronic pain syndrome: Secondary | ICD-10-CM | POA: Diagnosis not present

## 2017-10-09 DIAGNOSIS — M545 Low back pain: Secondary | ICD-10-CM | POA: Diagnosis not present

## 2017-10-11 ENCOUNTER — Encounter: Payer: Self-pay | Admitting: Cardiology

## 2017-10-11 ENCOUNTER — Ambulatory Visit: Payer: Medicare HMO | Admitting: Cardiology

## 2017-10-11 VITALS — BP 130/74 | HR 98 | Ht 65.0 in | Wt 225.0 lb

## 2017-10-11 DIAGNOSIS — Z01810 Encounter for preprocedural cardiovascular examination: Secondary | ICD-10-CM | POA: Diagnosis not present

## 2017-10-11 DIAGNOSIS — Z87891 Personal history of nicotine dependence: Secondary | ICD-10-CM | POA: Insufficient documentation

## 2017-10-11 DIAGNOSIS — Z8619 Personal history of other infectious and parasitic diseases: Secondary | ICD-10-CM | POA: Diagnosis not present

## 2017-10-11 DIAGNOSIS — Z8679 Personal history of other diseases of the circulatory system: Secondary | ICD-10-CM | POA: Diagnosis not present

## 2017-10-11 NOTE — Patient Instructions (Signed)
Medication Instructions:  Your physician recommends that you continue on your current medications as directed. Please refer to the Current Medication list given to you today.  If you need a refill on your cardiac medications before your next appointment, please call your pharmacy.   Lab work: None  If you have labs (blood work) drawn today and your tests are completely normal, you will receive your results only by: Marland Kitchen MyChart Message (if you have MyChart) OR . A paper copy in the mail If you have any lab test that is abnormal or we need to change your treatment, we will call you to review the results.  Testing/Procedures: Your physician has requested that you have a lexiscan myoview. For further information please visit [email or web address]. Please follow instruction sheet, as given.   Follow-Up: At [facility], you and your health needs are our priority.  As part of our continuing mission to provide you with exceptional heart care, we have created designated Provider Care Teams.  These Care Teams include your primary Cardiologist (physician) and Advanced Practice Providers (APPs -  Physician Assistants and Nurse Practitioners) who all work together to provide you with the care you need, when you need it.   You will need a follow up appointment in as needed.   Any Other Special Instructions Will Be Listed Below (If Applicable).

## 2017-10-11 NOTE — Progress Notes (Signed)
Cardiology Office Note:    Date:  10/11/2017   ID:  Stacy Campbell, DOB 1955/06/19, MRN [id number]  PCP:  Sherene Sires, DO  Cardiologist:  Jenean Lindau, MD   Referring MD: Clovis Riley, MD    ASSESSMENT:    1. Pre-operative cardiovascular examination   2. History of hepatitis C   3. History of atrial fibrillation   4. Former smoker    PLAN:    In order of problems listed above:  1. Primary prevention stressed with the patient.  Importance of compliance with diet and medication stressed and she vocalized understanding.  Her blood pressure is stable.  Diet was discussed for essential hypertension and obesity.  Weight reduction stressed risks of obesity explained.  She vocalized understanding. 2. In view of multiple risk factors for coronary artery disease she will undergo Lexiscan sestamibi testing.  If this is negative, then she is not at high risk for coronary events during the month since surgery.  Meticulous hemodynamic monitoring will further reduce the risk of coronary events.  She will be seen in follow-up appointment on a as needed basis.   Medication Adjustments/Labs and Tests Ordered: Current medicines are reviewed at length with the patient today.  Concerns regarding medicines are outlined above.  No orders of the defined types were placed in this encounter.  No orders of the defined types were placed in this encounter.    History of Present Illness:    Stacy Campbell is a 62 y.o. female who is being seen today for the evaluation of preoperative cardiovascular assessment at the request of Clovis Riley, MD.  Patient is a pleasant 61 year old female.  She has past medical history of essential hypertension.  She has history of hepatitis C and tells me that she has been cured with treatment.  She leads a sedentary lifestyle because of chronic low back issues.  She is here for preop assessment for surgery.  She denies any chest pain orthopnea or  PND.  At the time of my evaluation, the patient is alert awake oriented and in no distress.  She mentions to me that she hardly does much and is basically a very sedentary person because of orthopedic issues.  Past Medical History:  Diagnosis Date  . Anxiety   . Arthritis    "lower back" (01/29/2017)  . Asthma   . Bone spur    spine  . Chronic lower back pain   . Depression   . Eczema   . GERD (gastroesophageal reflux disease)   . Headache    "at least weekly" (01/29/2017)  . History of atrial fibrillation without current medication    cardioversion in 1990s  . Hypertension   . Insomnia     Past Surgical History:  Procedure Laterality Date  . CARDIOVERSION  1990s  . CYSTOSCOPY W/ URETERAL STENT PLACEMENT Left 01/29/2017   Procedure: CYSTOSCOPY WITH RETROGRADE PYELOGRAM/URETERAL STENT PLACEMENT;  Surgeon: Ardis Hughs, MD;  Location: [facility];  Service: Urology;  Laterality: Left;  . CYSTOSCOPY WITH RETROGRADE PYELOGRAM, URETEROSCOPY AND STENT PLACEMENT Left 01/29/2017   CYSTOSCOPY WITH RETROGRADE PYELOGRAM/URETERAL STENT PLACEMENT    Current Medications: Current Meds  Medication Sig  . albuterol (PROVENTIL HFA;VENTOLIN HFA) 108 (90 Base) MCG/ACT inhaler Inhale 2 puffs into the lungs every 6 (six) hours as needed for wheezing or shortness of breath.  . ALPRAZolam (XANAX) 1 MG tablet TAKE 1 TABLET BY MOUTH TWICE DAILY AS NEEDED FOR ANXIETY  . amLODipine (NORVASC) 10  MG tablet take 1 tablet by mouth once daily  . ARIPiprazole (ABILIFY) 5 MG tablet TAKE 1 TABLET BY MOUTH DAILY  . benzonatate (TESSALON) 100 MG capsule Take 1 capsule (100 mg total) by mouth 3 (three) times daily as needed for cough.  . carvedilol (COREG) 25 MG tablet Take 1 tablet (25 mg total) by mouth 2 (two) times daily with a meal.  . clobetasol cream (TEMOVATE) 6.56 % Apply 1 application topically 2 (two) times daily as needed. Only use on hands  . docusate sodium (COLACE) 250 MG capsule Take 1 capsule (250  mg total) by mouth daily. (Patient taking differently: Take 250 mg by mouth daily as needed. )  . hydrochlorothiazide (HYDRODIURIL) 25 MG tablet Take 1 tablet (25 mg total) by mouth 2 (two) times daily.  . meclizine (ANTIVERT) 25 MG tablet Take 1 tablet (25 mg total) by mouth 3 (three) times daily as needed for dizziness.  . montelukast (SINGULAIR) 10 MG tablet Take 1 tablet (10 mg total) by mouth at bedtime. (Patient taking differently: Take 10 mg by mouth daily as needed (allergies). )  . oxyCODONE-acetaminophen (PERCOCET/ROXICET) 5-325 MG tablet Take 2 tablets by mouth 4 (four) times daily.  . promethazine (PHENERGAN) 25 MG tablet take 1 tablet by mouth every 8 hours if needed for nausea and vomiting  . sertraline (ZOLOFT) 100 MG tablet Take 1 tablet (100 mg total) by mouth daily. Target dose is 150 mg at night.  Per Dr. Tammi Klippel.  #45 with 11 refills.  Marland Kitchen tiZANidine (ZANAFLEX) 4 MG tablet Take 4 mg by mouth 2 (two) times daily.  Marland Kitchen triamcinolone ointment (KENALOG) 0.5 % Apply 1 application topically 2 (two) times daily. (Patient taking differently: Apply 1 application topically 2 (two) times daily as needed (rash). )  . zolpidem (AMBIEN) 10 MG tablet Take 1 tablet (10 mg total) by mouth at bedtime as needed for sleep.     Allergies:   Hydrocodone; Aspirin; and Eggs or egg-derived products   Social History   Socioeconomic History  . Marital status: Widowed    Spouse name: Not on file  . Number of children: Not on file  . Years of education: Not on file  . Highest education level: Not on file  Occupational History  . Not on file  Social Needs  . Financial resource strain: Not on file  . Food insecurity:    Worry: Not on file    Inability: Not on file  . Transportation needs:    Medical: Not on file    Non-medical: Not on file  Tobacco Use  . Smoking status: Former Smoker    Packs/day: 0.25    Years: 37.00    Pack years: 9.25    Types: Cigarettes    Last attempt to quit: 03/30/2008     Years since quitting: 9.5  . Smokeless tobacco: Never Used  Substance and Sexual Activity  . Alcohol use: No  . Drug use: No  . Sexual activity: Not on file  Lifestyle  . Physical activity:    Days per week: Not on file    Minutes per session: Not on file  . Stress: Not on file  Relationships  . Social connections:    Talks on phone: Not on file    Gets together: Not on file    Attends religious service: Not on file    Active member of club or organization: Not on file    Attends meetings of clubs or organizations: Not on file  Relationship status: Not on file  Other Topics Concern  . Not on file  Social History Narrative  . Not on file     Family History: The patient's family history includes Asthma in her father and mother; Cancer in her father; Diabetes in her father and mother; Hepatitis B in her daughter. There is no history of Breast cancer.  ROS:   Please see the history of present illness.    All other systems reviewed and are negative.  EKGs/Labs/Other Studies Reviewed:    The following studies were reviewed today: EKG reveals sinus rhythm and nonspecific ST-T changes.   Recent Labs: 01/29/2017: ALT 38 02/01/2017: BUN 7; Creatinine, Ser 0.87; Hemoglobin 13.5; Platelets 227; Potassium 3.3; Sodium 137  Recent Lipid Panel    Component Value Date/Time   CHOL 166 04/19/2015 0916   TRIG 170 (H) 04/19/2015 0916   HDL 33 (L) 04/19/2015 0916   CHOLHDL 5.0 04/19/2015 0916   VLDL 34 (H) 04/19/2015 0916   LDLCALC 99 04/19/2015 0916    Physical Exam:    VS:  BP 130/74 (BP Location: Right Arm, Patient Position: Sitting, Cuff Size: Normal)   Pulse 98   Ht 5\' 5"  (1.651 m)   Wt 225 lb (102.1 kg)   SpO2 98%   BMI 37.44 kg/m     Wt Readings from Last 3 Encounters:  10/11/17 225 lb (102.1 kg)  09/10/17 225 lb 9.6 oz (102.3 kg)  08/22/17 224 lb 3.2 oz (101.7 kg)     GEN: Patient is in no acute distress HEENT: Normal NECK: No JVD; No carotid  bruits LYMPHATICS: No lymphadenopathy CARDIAC: S1 S2 regular, 2/6 systolic murmur at the apex. RESPIRATORY:  Clear to auscultation without rales, wheezing or rhonchi  ABDOMEN: Soft, non-tender, non-distended MUSCULOSKELETAL:  No edema; No deformity  SKIN: Warm and dry NEUROLOGIC:  Alert and oriented x 3 PSYCHIATRIC:  Normal affect    Signed, Jenean Lindau, MD  10/11/2017 9:40 AM    [place]

## 2017-10-16 DIAGNOSIS — K8020 Calculus of gallbladder without cholecystitis without obstruction: Secondary | ICD-10-CM | POA: Diagnosis not present

## 2017-10-16 DIAGNOSIS — N131 Hydronephrosis with ureteral stricture, not elsewhere classified: Secondary | ICD-10-CM | POA: Diagnosis not present

## 2017-10-21 ENCOUNTER — Other Ambulatory Visit: Payer: Self-pay | Admitting: Family Medicine

## 2017-10-21 DIAGNOSIS — N131 Hydronephrosis with ureteral stricture, not elsewhere classified: Secondary | ICD-10-CM | POA: Diagnosis not present

## 2017-10-24 ENCOUNTER — Telehealth (HOSPITAL_COMMUNITY): Payer: Self-pay | Admitting: *Deleted

## 2017-10-24 NOTE — Telephone Encounter (Signed)
Left message on voicemail per DPR in reference to upcoming appointment scheduled on 10/28/17 with detailed instructions given per Myocardial Perfusion Study Information Sheet for the test. LM to arrive 15 minutes early, and that it is imperative to arrive on time for appointment to keep from having the test rescheduled. If you need to cancel or reschedule your appointment, please call the office within 24 hours of your appointment. Failure to do so may result in a cancellation of your appointment, and a $50 no show fee. Phone number given for call back for any questions. Damesha Lawler Jacqueline    

## 2017-10-28 ENCOUNTER — Ambulatory Visit (HOSPITAL_COMMUNITY): Payer: Medicare HMO | Attending: Cardiovascular Disease

## 2017-10-28 VITALS — Ht 65.0 in | Wt 225.0 lb

## 2017-10-28 DIAGNOSIS — Z01810 Encounter for preprocedural cardiovascular examination: Secondary | ICD-10-CM | POA: Diagnosis not present

## 2017-10-28 HISTORY — PX: NM MYOVIEW LTD: HXRAD82

## 2017-10-28 MED ORDER — TECHNETIUM TC 99M TETROFOSMIN IV KIT
32.7000 | PACK | Freq: Once | INTRAVENOUS | Status: AC | PRN
  Administered 2017-10-28: 32.7 via INTRAVENOUS
  Filled 2017-10-28: qty 33

## 2017-10-28 MED ORDER — REGADENOSON 0.4 MG/5ML IV SOLN
0.4000 mg | Freq: Once | INTRAVENOUS | Status: AC
Start: 2017-10-28 — End: 2017-10-28
  Administered 2017-10-28: 0.4 mg via INTRAVENOUS

## 2017-10-29 ENCOUNTER — Ambulatory Visit (HOSPITAL_COMMUNITY): Payer: Medicare HMO | Attending: Cardiology

## 2017-10-29 LAB — MYOCARDIAL PERFUSION IMAGING
CHL CUP NUCLEAR SDS: 2
CHL CUP NUCLEAR SRS: 0
CHL CUP NUCLEAR SSS: 2
CHL CUP RESTING HR STRESS: 70 {beats}/min
CSEPPHR: 98 {beats}/min
LV sys vol: 27 mL
LVDIAVOL: 72 mL (ref 46–106)
TID: 1.08

## 2017-10-29 MED ORDER — TECHNETIUM TC 99M TETROFOSMIN IV KIT
31.2000 | PACK | Freq: Once | INTRAVENOUS | Status: AC | PRN
  Administered 2017-10-29: 31.2 via INTRAVENOUS
  Filled 2017-10-29: qty 32

## 2017-11-06 DIAGNOSIS — G894 Chronic pain syndrome: Secondary | ICD-10-CM | POA: Diagnosis not present

## 2017-11-06 DIAGNOSIS — Z79891 Long term (current) use of opiate analgesic: Secondary | ICD-10-CM | POA: Diagnosis not present

## 2017-11-06 DIAGNOSIS — M545 Low back pain: Secondary | ICD-10-CM | POA: Diagnosis not present

## 2017-12-04 ENCOUNTER — Ambulatory Visit (INDEPENDENT_AMBULATORY_CARE_PROVIDER_SITE_OTHER): Payer: Medicare HMO | Admitting: Psychology

## 2017-12-04 DIAGNOSIS — M545 Low back pain: Secondary | ICD-10-CM | POA: Diagnosis not present

## 2017-12-04 DIAGNOSIS — Z79891 Long term (current) use of opiate analgesic: Secondary | ICD-10-CM | POA: Diagnosis not present

## 2017-12-04 DIAGNOSIS — G894 Chronic pain syndrome: Secondary | ICD-10-CM | POA: Diagnosis not present

## 2017-12-04 DIAGNOSIS — F411 Generalized anxiety disorder: Secondary | ICD-10-CM | POA: Diagnosis not present

## 2017-12-04 NOTE — Assessment & Plan Note (Signed)
ASSESSMENT: Mood: Anxious and Affect: Appropriate;Thought process: Coherent;  No plan to harm self or others Patient hasn't been seen since May. She seems to manage her anxiety by distraction and occasionally taking alprazolam (she estimates about once a month).   Patient continues to experience symptoms of anxiety related to family issues and her own health. She may be interested in further care through integrated care and will call if she decides to pursue.  GOALS: Patient agreed to consider writing down anxious thoughts in order to make them more concrete and to eventually get to a place where she might be able to evaluate them / shift them.   PLAN:  1.Patient will call to schedule with Integrated Care as needed.

## 2017-12-04 NOTE — Progress Notes (Signed)
Integrated Behavioral Health Follow Up Visit  MRN: [id number] Name: Stacy Campbell  Number of [place] Clinician visits: 1/6 (since May - last seen then) Session Start time: 8:40  Session End time: 9:20 Total time: 40 minutes  Reason for follow-up: Continue brief intervention to assist patient with managing symptoms of anxiety, as well as chronic stressors related to her health and family.  Report of symptoms: Anxious, difficulty sleeping, feeling overwhelmed at times.    ASSESSMENT: Mood: Anxious and Affect: Appropriate;Thought process: Coherent;  No plan to harm self or others Patient hasn't been seen since May. She seems to manage her anxiety by distraction and occasionally taking alprazolam (she estimates about once a month).   Patient continues to experience symptoms of anxiety related to family issues and her own health. She may be interested in further care through integrated care and will call if she decides to pursue.  GOALS: Patient agreed to consider writing down anxious thoughts in order to make them more concrete and to eventually get to a place where she might be able to evaluate them / shift them.   PLAN:  1.Patient will call to schedule with Integrated Care as needed.    Intervention: Solution-Focused Strategies, Reflective listening, Behavioral Therapy (Relaxed breathing); Brief Counseling/Psychotherapy

## 2017-12-11 ENCOUNTER — Other Ambulatory Visit: Payer: Self-pay

## 2017-12-11 MED ORDER — ALPRAZOLAM 1 MG PO TABS: 1.0000 mg | ORAL_TABLET | Freq: Two times a day (BID) | ORAL | 0 refills | Status: DC | PRN

## 2017-12-13 DIAGNOSIS — L0889 Other specified local infections of the skin and subcutaneous tissue: Secondary | ICD-10-CM | POA: Diagnosis not present

## 2017-12-13 DIAGNOSIS — L2089 Other atopic dermatitis: Secondary | ICD-10-CM | POA: Diagnosis not present

## 2017-12-13 DIAGNOSIS — L011 Impetiginization of other dermatoses: Secondary | ICD-10-CM | POA: Diagnosis not present

## 2017-12-24 ENCOUNTER — Other Ambulatory Visit: Payer: Self-pay

## 2017-12-24 ENCOUNTER — Ambulatory Visit (INDEPENDENT_AMBULATORY_CARE_PROVIDER_SITE_OTHER): Payer: Medicare HMO | Admitting: Family Medicine

## 2017-12-24 ENCOUNTER — Encounter: Payer: Self-pay | Admitting: Family Medicine

## 2017-12-24 VITALS — BP 142/90 | HR 84 | Temp 98.7°F | Ht 65.0 in | Wt 229.0 lb

## 2017-12-24 DIAGNOSIS — R110 Nausea: Secondary | ICD-10-CM | POA: Diagnosis not present

## 2017-12-24 DIAGNOSIS — F411 Generalized anxiety disorder: Secondary | ICD-10-CM | POA: Diagnosis not present

## 2017-12-24 DIAGNOSIS — I10 Essential (primary) hypertension: Secondary | ICD-10-CM | POA: Diagnosis not present

## 2017-12-24 DIAGNOSIS — R42 Dizziness and giddiness: Secondary | ICD-10-CM

## 2017-12-24 DIAGNOSIS — J449 Chronic obstructive pulmonary disease, unspecified: Secondary | ICD-10-CM | POA: Diagnosis not present

## 2017-12-24 MED ORDER — ALBUTEROL SULFATE HFA 108 (90 BASE) MCG/ACT IN AERS: 2.0000 | INHALATION_SPRAY | Freq: Four times a day (QID) | RESPIRATORY_TRACT | 2 refills | Status: DC | PRN

## 2017-12-24 MED ORDER — AMLODIPINE BESYLATE 10 MG PO TABS: 10.0000 mg | ORAL_TABLET | Freq: Every day | ORAL | 3 refills | Status: DC

## 2017-12-24 MED ORDER — PROMETHAZINE HCL 25 MG PO TABS: ORAL_TABLET | ORAL | 1 refills | Status: DC

## 2017-12-24 MED ORDER — CARVEDILOL 25 MG PO TABS: 25.0000 mg | ORAL_TABLET | Freq: Two times a day (BID) | ORAL | 3 refills | Status: DC

## 2017-12-24 MED ORDER — MECLIZINE HCL 25 MG PO TABS: 25.0000 mg | ORAL_TABLET | Freq: Three times a day (TID) | ORAL | 1 refills | Status: DC | PRN

## 2017-12-24 MED ORDER — LISINOPRIL 10 MG PO TABS: 10.0000 mg | ORAL_TABLET | Freq: Every day | ORAL | 3 refills | Status: DC

## 2017-12-24 NOTE — Patient Instructions (Addendum)
  It was a pleasure to see you today! Thank you for choosing [facility] for your primary care. Stacy Campbell was seen for blood pressure discussion. Come back to the clinic in 2 weeks for your lab and nurse visit, and go to the emergency room if you have any life-threatening symptoms.   You had asked about seeing Dr. Gwenlyn Saran so we went to give your contact information.  If she needs a specific referral from [place] we are willing to place it.  [facility] Phone: [phone]  Address: [address], [place],  [address]  We also talked about your blood pressure control.  You are doing well but we think it could be controlled little bit better.  We are going to restart lisinopril and test your kidney function today.  He need to come back in 2 weeks for a lab visit they will recheck your kidney function and he will have a nurse take your blood pressure.  This does not need to be a full doctor's visit.  If we did any lab work today that did not result today, one of two things will happen.  1. If everything is normal, you will get a letter in mail sent to the address in your chart with the results for your records.  It is important to keep your address up to date as that is where we will send results.  2. If the results require some sort of discussion, my nurses or myself will call you on the phone number listed in your records.  It is important to keep your phone number up to date in our system as this is how we will try to reach you.  If we cannot reach you on the phone, we will try to send you a letter in the mail so please enable to voicemail function of your phone.  If you don't hear from [place] in two weeks, please give [place] a call to verify your results. Otherwise, we look forward to seeing you again at your next visit. If you have any questions or concerns before then, please call the clinic at [phone].   Please bring all your medications to every  doctors visit   Sign up for My Chart to have easy access to your labs results, and communication with your Primary care physician.     Please check-out at the front desk before leaving the clinic.     Best,  Dr. Sherene Sires FAMILY MEDICINE RESIDENT - PGY2 12/24/2017 2:42 PM

## 2017-12-25 LAB — BASIC METABOLIC PANEL
BUN/Creatinine Ratio: 13 (ref 12–28)
BUN: 9 mg/dL (ref 8–27)
CO2: 23 mmol/L (ref 20–29)
Calcium: 9.5 mg/dL (ref 8.7–10.3)
Chloride: 104 mmol/L (ref 96–106)
Creatinine, Ser: 0.72 mg/dL (ref 0.57–1.00)
GFR calc non Af Amer: 90 mL/min/{1.73_m2} (ref >59)
GFR, EST AFRICAN AMERICAN: 104 mL/min/{1.73_m2} (ref >59)
Glucose: 87 mg/dL (ref 65–99)
Potassium: 4.2 mmol/L (ref 3.5–5.2)
Sodium: 147 mmol/L — ABNORMAL HIGH (ref 134–144)

## 2017-12-27 NOTE — Assessment & Plan Note (Signed)
No exacerbations lately, patient uses albuterol approximately once per month and thinks that she is doing fine does not need additional stabilization medicines.  Would like refill of her albuterol and it is being refilled

## 2017-12-27 NOTE — Progress Notes (Signed)
Subjective:  Stacy Campbell is a 62 y.o. female who presents to the [facility] today with a chief complaint of medication follow-up.   HPI: Patient with multiple issues to discuss.  She says that her anxiety is well controlled she denies SI-HI.  She is not requesting a new medication and thinks that she is stable but she would like to continue therapy with Dr. Gwenlyn Saran, she is disappointed to hear Dr. Gwenlyn Saran is no longer with [place] but would like to see if she can still use Dr. Gwenlyn Saran is her psychologist.  We discussed the fact that her blood pressure was still above goal.  She says that she is probably done as much as she can in terms of diet and activity level and wants to know if you think that she needs additional medication she thinks that she is consistent with her current medications and has no complaints with them.  She states that her dizziness is well controlled on her current medication she just needs a chronic refill.  She has had no falls and does not experience vertigo symptoms that limit her till activities.  Nausea and vomiting is actually controlled at this point on Phenergan.  She is had no recent episodes of vomiting and does not remember the last time that she was nauseous enough to impact her eating she would like a refill of his medication and claims no negative side effects.   Objective:  Physical Exam: BP (!) 142/90   Pulse 84   Temp 98.7 F (37.1 C) (Oral)   Ht 5\' 5"  (1.651 m)   Wt 229 lb (103.9 kg)   SpO2 98%   BMI 38.11 kg/m   Gen: NAD, resting comfortably CV: RRR with no murmurs appreciated Pulm: NWOB, CTAB with no crackles, wheezes, or rhonchi MSK: no edema, cyanosis, or clubbing noted Skin: warm, dry Neuro: grossly normal, moves all extremities Psych: Normal affect and thought content  Results for orders placed or performed in visit on 12/24/17 (from the past 72 hour(s))  Basic Metabolic Panel     Status: Abnormal   Collection Time: 12/24/17  2:06 PM    Result Value Ref Range   Glucose 87 65 - 99 mg/dL   BUN 9 8 - 27 mg/dL   Creatinine, Ser 0.72 0.57 - 1.00 mg/dL   GFR calc non Af Amer 90 >59 mL/min/1.73   GFR calc Af Amer 104 >59 mL/min/1.73   BUN/Creatinine Ratio 13 12 - 28   Sodium 147 (H) 134 - 144 mmol/L   Potassium 4.2 3.5 - 5.2 mmol/L   Chloride 104 96 - 106 mmol/L   CO2 23 20 - 29 mmol/L   Calcium 9.5 8.7 - 10.3 mg/dL     Assessment/Plan:  Nausea without vomiting Well-controlled on as needed Phenergan, will re fill  Dizziness No falls lately, will refill meclizine as patient says this is doing well for her.  No vertigo symptoms  Anxiety state Patient denies any SI-HI, says that she is doing well currently but would like to continue therapy with Dr. Gwenlyn Saran.  She was just wanted to his doctor his left and wanted know if she could be referred to her as she is still in [place].  Contact information for Dr. Gwenlyn Saran is been given to her and if Dr. Tod Persia office requires an official referral we can place that there is one place in our system tell referral coordinator if needed.  Asthma with COPD No exacerbations lately, patient uses albuterol approximately  once per month and thinks that she is doing fine does not need additional stabilization medicines.  Would like refill of her albuterol and it is being refilled  HYPERTENSION, BENIGN SYSTEMIC Patient's hypertension is still above goal.  We will refill her amlodipine and carvedilol which she says she is doing well on has no negative symptoms.  We discussed impact of adding an ACE inhibitor.  We will start with lisinopril 10 and we are drawing a BMP today.  Patient instructed to return for nurse visit in approximately 2 weeks for repeat BMP and blood pressure check.  She understands the importance of this and potential renal impact and says that she will come as instructed.   Sherene Sires, DO FAMILY MEDICINE RESIDENT - PGY2 12/27/2017 9:34 AM

## 2017-12-27 NOTE — Assessment & Plan Note (Signed)
No falls lately, will refill meclizine as patient says this is doing well for her.  No vertigo symptoms

## 2017-12-27 NOTE — Assessment & Plan Note (Signed)
Patient's hypertension is still above goal.  We will refill her amlodipine and carvedilol which she says she is doing well on has no negative symptoms.  We discussed impact of adding an ACE inhibitor.  We will start with lisinopril 10 and we are drawing a BMP today.  Patient instructed to return for nurse visit in approximately 2 weeks for repeat BMP and blood pressure check.  She understands the importance of this and potential renal impact and says that she will come as instructed.

## 2017-12-27 NOTE — Assessment & Plan Note (Signed)
Well-controlled on as needed Phenergan, will re fill

## 2017-12-27 NOTE — Assessment & Plan Note (Signed)
Patient denies any SI-HI, says that she is doing well currently but would like to continue therapy with Dr. Gwenlyn Saran.  She was just wanted to his doctor his left and wanted know if she could be referred to her as she is still in [place].  Contact information for Dr. Gwenlyn Saran is been given to her and if Dr. Tod Persia office requires an official referral we can place that there is one place in our system tell referral coordinator if needed.

## 2018-01-06 DIAGNOSIS — Z79891 Long term (current) use of opiate analgesic: Secondary | ICD-10-CM | POA: Diagnosis not present

## 2018-01-06 DIAGNOSIS — G894 Chronic pain syndrome: Secondary | ICD-10-CM | POA: Diagnosis not present

## 2018-01-06 DIAGNOSIS — M545 Low back pain: Secondary | ICD-10-CM | POA: Diagnosis not present

## 2018-01-09 ENCOUNTER — Other Ambulatory Visit: Payer: Medicare HMO

## 2018-01-09 ENCOUNTER — Ambulatory Visit: Payer: Medicare HMO

## 2018-01-09 VITALS — BP 138/74 | HR 66

## 2018-01-09 DIAGNOSIS — I10 Essential (primary) hypertension: Secondary | ICD-10-CM

## 2018-01-09 NOTE — Progress Notes (Signed)
   Patient in to nurse clinic for BP check. BP is 138/74, pulse is 66. Patient has had no medications since yesterday. Denies HA, SOB, CP, edema, or visual disturbances. Patient had BMP drawn in lab.  Danley Danker, RN [facility] RN)

## 2018-01-10 ENCOUNTER — Encounter: Payer: Self-pay | Admitting: Family Medicine

## 2018-01-10 ENCOUNTER — Other Ambulatory Visit: Payer: Self-pay | Admitting: Family Medicine

## 2018-01-10 LAB — BASIC METABOLIC PANEL
BUN/Creatinine Ratio: 15 (ref 12–28)
BUN: 10 mg/dL (ref 8–27)
CHLORIDE: 103 mmol/L (ref 96–106)
CO2: 23 mmol/L (ref 20–29)
Calcium: 9.4 mg/dL (ref 8.7–10.3)
Creatinine, Ser: 0.67 mg/dL (ref 0.57–1.00)
GFR calc Af Amer: 109 mL/min/{1.73_m2} (ref >59)
GFR calc non Af Amer: 95 mL/min/{1.73_m2} (ref >59)
Glucose: 130 mg/dL — ABNORMAL HIGH (ref 65–99)
Potassium: 4.1 mmol/L (ref 3.5–5.2)
Sodium: 142 mmol/L (ref 134–144)

## 2018-01-10 NOTE — Progress Notes (Signed)
Checked pmpaware when receiving refill request for ambien.  Patient has been seeing pain management with increase percocet doses to 5x10 percocet daily.  Relative risk with percocet/ambien/alprazolam now 370 per pmp aware.  Do not suspect diversion or abusive behavior but these doses are a risk to patient.  Called patient and the pain management office and notifed them the ambien would stopped and we would likely wean alprazolam.  -Dr. Criss Rosales

## 2018-01-13 ENCOUNTER — Other Ambulatory Visit: Payer: Self-pay | Admitting: Family Medicine

## 2018-01-13 DIAGNOSIS — F411 Generalized anxiety disorder: Secondary | ICD-10-CM

## 2018-01-13 MED ORDER — ALPRAZOLAM 1 MG PO TABS: 0.5000 mg | ORAL_TABLET | Freq: Every evening | ORAL | 0 refills | Status: DC | PRN

## 2018-01-13 NOTE — Telephone Encounter (Signed)
To white team.

## 2018-01-13 NOTE — Progress Notes (Signed)
Xanax is being titrated down to eventual dose of 0.5mg  in addition to [place] being stopped due to finding pmpaware indicating OD risk as now 370 with increasing percocet dosing.  Percocet prescriber office and patient is notified.  Prior was 30x 1mg  tabs per 30days, this refill is for 27 tabs to be taken as 0.5 or 1mg  over 30 days.  Likely titrate down by a few tab per refill until reaching 66month.  Then will re-evaluate on pmpaware risk.  Patient is not suspected of diversion/abuse.  This is a patient safety decision.  -Dr. Criss Rosales

## 2018-01-15 ENCOUNTER — Encounter: Payer: Self-pay | Admitting: Allergy

## 2018-01-15 ENCOUNTER — Ambulatory Visit: Payer: Medicare HMO | Admitting: Allergy

## 2018-01-15 VITALS — BP 160/92 | HR 83 | Temp 98.2°F | Resp 16 | Ht 64.0 in | Wt 220.6 lb

## 2018-01-15 DIAGNOSIS — J4530 Mild persistent asthma, uncomplicated: Secondary | ICD-10-CM | POA: Insufficient documentation

## 2018-01-15 DIAGNOSIS — T781XXD Other adverse food reactions, not elsewhere classified, subsequent encounter: Secondary | ICD-10-CM | POA: Diagnosis not present

## 2018-01-15 DIAGNOSIS — R21 Rash and other nonspecific skin eruption: Secondary | ICD-10-CM | POA: Diagnosis not present

## 2018-01-15 DIAGNOSIS — L299 Pruritus, unspecified: Secondary | ICD-10-CM | POA: Insufficient documentation

## 2018-01-15 DIAGNOSIS — J4520 Mild intermittent asthma, uncomplicated: Secondary | ICD-10-CM

## 2018-01-15 DIAGNOSIS — J3089 Other allergic rhinitis: Secondary | ICD-10-CM

## 2018-01-15 DIAGNOSIS — T781XXA Other adverse food reactions, not elsewhere classified, initial encounter: Secondary | ICD-10-CM | POA: Insufficient documentation

## 2018-01-15 MED ORDER — FLUTICASONE FUROATE 100 MCG/ACT IN AEPB: 1.0000 | INHALATION_SPRAY | Freq: Every day | RESPIRATORY_TRACT | 5 refills | Status: DC

## 2018-01-15 NOTE — Assessment & Plan Note (Signed)
Rash/pruritus since 2007 which occurs on her torso, legs and hands. Follows with dermatology. No previous skin biopsy or patch testing. Was on [place] in 2019 but stopped due to localized reaction at injection site. Up to date with age appropriate cancer screening exams.  Today's skin testing showed: Positive to grass, tree, dust mites, cockroaches, ragweed, weed, mold, cat, dog. Discussed environmental control measures.   Given clinical history not sure of etiology of this rash/pruritus. She does have some history of aspirin causing pruritus in the past and is currently taking it. If work up is negative and symptoms persistent may be worthwhile stopping aspirin and see if symptoms improve.  Start zyrtec 10mg  daily and may increase to twice a day if needed as long it does not make you tired.  If itching not better then call us and will change medications around. Patient had some benefit with doxepin in the past.   Get bloodwork as below.  If rash not improved will consider patch testing.  Discussed proper skin care measures.

## 2018-01-15 NOTE — Progress Notes (Signed)
New Patient Note  RE: Stacy Campbell MRN: [id number] DOB: 03-25-1955 Date of Office Visit: 01/15/2018  Referring provider: No ref. provider found Primary care provider: Sherene Sires, DO  Chief Complaint: Pruritis; Rash; and Urticaria  History of Present Illness: I had the pleasure of seeing Stacy Campbell for initial evaluation at the [facility] on 01/15/2018. She is a 63 y.o. female, who is self-referred here for the evaluation of rash/pruritus.   Rash and pruritus started around 2007. Mainly occurs on her torso, legs, hands. The itching is very bothersome. Describes them as very pruritic and erythematous. Individual rashes lasts about anywhere from a few hours to a few days. Some ecchymosis upon resolution which may be secondary from her scratching it. Associated symptoms include: none. Suspected triggers are unknown. Denies any fevers, chills, foods, personal care products or recent infections. She has tried the following therapies: benadryl with minimal benefit, triamcinolone with some benefit. Systemic steroids yes for hives but did not help with the itching.  Previous work up includes: patient is following with dermatologist as well. No previous skin biopsy or patch testing.  Patient was on [place] in 2019 which helped initially but then developed localized reactions at injection sites.  Previous history of rash/hives: yes for hives and history of eczema. Patient is up to date with the following cancer screening tests: mammogram, colonoscopy and pap smears.  Assessment and Plan: Stacy Campbell is a 63 y.o. female with: Rash and other nonspecific skin eruption Rash/pruritus since 2007 which occurs on her torso, legs and hands. Follows with dermatology. No previous skin biopsy or patch testing. Was on [place] in 2019 but stopped due to localized reaction at injection site. Up to date with age appropriate cancer screening exams.  Today's skin testing showed:  Positive to grass, tree, dust mites, cockroaches, ragweed, weed, mold, cat, dog. Discussed environmental control measures.   Given clinical history not sure of etiology of this rash/pruritus. She does have some history of aspirin causing pruritus in the past and is currently taking it. If work up is negative and symptoms persistent may be worthwhile stopping aspirin and see if symptoms improve.  Start zyrtec 10mg  daily and may increase to twice a day if needed as long it does not make you tired.  If itching not better then call us and will change medications around. Patient had some benefit with doxepin in the past.   Get bloodwork as below.  If rash not improved will consider patch testing.  Discussed proper skin care measures.   Pruritus See assessment and plan as above.  Mild persistent asthma without complication Diagnosed with asthma 50+ years ago. Uses albuterol once a week with good benefit.   Today's spirometry showed: 19% improvement in FEV1 post bronchodilator treatment and clinically felt better. . Daily controller medication(s): Start arnuity 1 puff once a day and rinse mouth afterwards.  . Prior to physical activity: May use albuterol rescue inhaler 2 puffs 5 to 15 minutes prior to strenuous physical activities. Marland Kitchen Rescue medications: May use albuterol rescue inhaler 2 puffs or nebulizer every 4 to 6 hours as needed for shortness of breath, chest tightness, coughing, and wheezing. Monitor frequency of use.   Other allergic rhinitis Rhinoconjunctivitis symptoms for the past 50 years mainly in the spring and summer.  Patient has used Singulair and Claritin with some benefit.  Today's skin testing showed: Positive to grass, tree, dust mites, cockroaches, ragweed, weed, mold, cat, dog.  Discussed environmental control measures.  Start zyrtec 10mg  daily and may increase to twice a day if needed as long it does not make you tired.  Adverse food reaction Eggs apparently cause  pruritus and has been avoiding it.  Today's skin testing was negative to eggs.  Continue avoidance.  For mild symptoms you can take over the counter antihistamines such as Benadryl and monitor symptoms closely. If symptoms worsen or if you have severe symptoms including breathing issues, throat closure, significant swelling, whole body hives, severe diarrhea and vomiting, lightheadedness then seek immediate medical care.  Return in about 4 weeks (around 02/12/2018).  Meds ordered this encounter  Medications  . Fluticasone Furoate (ARNUITY ELLIPTA) 100 MCG/ACT AEPB    Sig: Inhale 1 puff into the lungs daily.    Dispense:  1 each    Refill:  5    Lab Orders     CBC With Differential     Hepatic function panel     Thyroid Cascade Profile  Other allergy screening: Asthma: yes  She reports symptoms of chest tightness, shortness of breath, coughing, wheezing for 50+ years. Current medications include albuterol prn which help. She tried the following inhalers: unsure. Main asthma triggers are infections, weather changes. In the last month, frequency of asthma symptoms: 1x/week. Frequency of nocturnal symptoms: 0x/month. Frequency of SABA use: 1x/week. In the last 12 months, emergency room visits/urgent care visits/doctor office visits or hospitalizations due to asthma: 0. In the last 12 months, oral steroids courses: 0.  Rhino conjunctivitis: yes  She reports symptoms of itchy eyes/nose, scratchy throat, sneezing. Symptoms have been going on for 50+ years. The symptoms are present during the spring and summer. She has used Singulair, Claritin with fair improvement in symptoms. Previous work up includes: skin testing as a young child but not sure of results.  Food allergy: yes  Eggs cause pruritus.   Medication allergy: yes Aspirin - itching Hydrocodone - itching Hymenoptera allergy: yes  Urticaria: yes Eczema:yes History of recurrent infections suggestive of immunodeficency:  no  Diagnostics: Spirometry:  Tracings reviewed. Her effort: Good reproducible efforts. FVC: 2.02L FEV1: 1.52L, 74% predicted FEV1/FVC ratio: 75% Interpretation: spirometry showed 19% improvement in FEV1 post bronchodilator treatment.  Please see scanned spirometry results for details.  Skin Testing: Environmental allergy panel and select foods. Positive test to: grass, tree, dust mites, cockroaches, ragweed, weed, mold, cat, dog. Negative test to: common foods.  Results discussed with patient/family. Airborne Adult Perc - 01/15/18 0925    Time Antigen Placed  6578    Allergen Manufacturer  Lavella Hammock    Location  Back    Number of Test  59    Panel 1  Select    1. Control-Buffer 50% Glycerol  Negative    2. Control-Histamine 1 mg/ml  2+    3. Albumin saline  Negative    4. [place]  Negative    5. [place]  Negative    6. [place]  Negative    7. [place]  4+    8. Meadow Fescue  4+    9. Perennial Rye  4+    10. Sweet Vernal  Negative    11. Timothy  2+    12. Cocklebur  Negative    13. Burweed Marshelder  Negative    14. Ragweed, short  Negative    15. Ragweed, Giant  Negative    16. Plantain,  English  Negative    17. Lamb's Quarters  Negative    18. Sheep Sorrell  Negative  19. Rough Pigweed  Negative    20. Marsh Elder, Rough  Negative    21. Mugwort, Common  Negative    22. Ash mix  Negative    23. Birch mix  Negative    24. Beech American  Negative    25. Box, Elder  Negative    26. Cedar, red  Negative    27. Cottonwood, [place]  Negative    28. Elm mix  Negative    29. Hickory mix  Negative    30. Maple mix  Negative    31. Oak, [place] mix  Negative    32. Pecan Pollen  Negative    33. Pine mix  2+    34. Sycamore Eastern  Negative    35. Camp, Black Pollen  Negative    36. Alternaria alternata  Negative    37. Cladosporium Herbarum  Negative    38. Aspergillus mix  Negative    39. Penicillium mix  Negative    40. Bipolaris sorokiniana  (Helminthosporium)  Negative    41. Drechslera spicifera (Curvularia)  Negative    42. Mucor plumbeus  Negative    43. Fusarium moniliforme  Negative    44. Aureobasidium pullulans (pullulara)  Negative    45. Rhizopus oryzae  Negative    46. Botrytis cinera  Negative    47. Epicoccum nigrum  Negative    48. Phoma betae  Negative    49. Candida Albicans  Negative    50. Trichophyton mentagrophytes  Negative    51. Mite, D Farinae  5,000 AU/ml  4+    52. Mite, D Pteronyssinus  5,000 AU/ml  4+    53. Cat Hair 10,000 BAU/ml  4+    54.  Dog Epithelia  Negative    55. Mixed Feathers  Negative    56. Horse Epithelia  Negative    57. Cockroach, German  4+    58. Mouse  Negative    59. Tobacco Leaf  Negative     Intradermal - 01/15/18 1005    Time Antigen Placed  1015    Allergen Manufacturer  Lavella Hammock    Location  Arm    Number of Test  11    Intradermal  Select    Control  Negative    [place]  4+    Johnson  4+    Ragweed mix  4+    Weed mix  2+    Tree mix  4+    Mold 1  Negative    Mold 2  2+    Mold 3  Negative    Mold 4  2+    Dog  2+     Food Adult Perc - 01/15/18 0925    Time Antigen Placed  9983    Allergen Manufacturer  Lavella Hammock    Location  Back    Number of allergen test  12     Control-buffer 50% Glycerol  Negative    Control-Histamine 1 mg/ml  2+    1. Peanut  Negative    2. Soybean  Negative    3. Wheat  Negative    4. Sesame  Negative    5. Milk, cow  Negative    6. Egg White, Chicken  Negative    7. Casein  Negative    8. Shellfish Mix  Negative    9. Fish Mix  Negative    10. Cashew  Negative       Past Medical History: Patient  Active Problem List   Diagnosis Date Noted  . Pruritus 01/15/2018  . Mild persistent asthma without complication 34/74/2595  . Adverse food reaction 01/15/2018  . History of hepatitis C 10/11/2017  . History of atrial fibrillation 10/11/2017  . Former smoker 10/11/2017  . Dyshidrotic eczema 09/10/2017  . Dizziness  08/20/2017  . Dysuria 08/20/2017  . Flank pain 02/15/2017  . GERD (gastroesophageal reflux disease) 02/15/2017  . Belly pain 01/29/2017  . Piriformis syndrome of left side 07/10/2015  . Psoriasis 07/10/2015  . Leg cramps 10/17/2014  . Pre-operative cardiovascular examination 10/17/2014  . Rash and other nonspecific skin eruption 03/22/2014  . Knee pain, bilateral 10/08/2013  . Asthma with COPD ([place]) 05/25/2013  . Nausea without vomiting 10/12/2012  . Other allergic rhinitis 10/12/2012  . Hepatitis C 03/06/2011  . 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 Anxiety state 07/30/2007  . Obesity, Class II, BMI 35-39.9 03/07/2006  . Depression 03/07/2006  . HYPERTENSION, BENIGN SYSTEMIC 03/07/2006  . Chronic low back pain 03/07/2006  . Insomnia 03/07/2006   Past Medical History:  Diagnosis Date  . Angio-edema   . Anxiety   . Arthritis    "lower back" (01/29/2017)  . Asthma   . Bone spur    spine  . Chronic lower back pain   . Depression   . Eczema   . GERD (gastroesophageal reflux disease)   . Headache    "at least weekly" (01/29/2017)  . History of atrial fibrillation without current medication    cardioversion in 1990s  . Hypertension   . Insomnia   . Urticaria    Past Surgical History: Past Surgical History:  Procedure Laterality Date  . CARDIOVERSION  1990s  . CYSTOSCOPY W/ URETERAL STENT PLACEMENT Left 01/29/2017   Procedure: CYSTOSCOPY WITH RETROGRADE PYELOGRAM/URETERAL STENT PLACEMENT;  Surgeon: Ardis Hughs, MD;  Location: [facility];  Service: Urology;  Laterality: Left;  . CYSTOSCOPY WITH RETROGRADE PYELOGRAM, URETEROSCOPY AND STENT PLACEMENT Left 01/29/2017   CYSTOSCOPY WITH RETROGRADE PYELOGRAM/URETERAL STENT PLACEMENT   Medication List:  Current Outpatient Medications  Medication Sig Dispense Refill  . albuterol (PROVENTIL HFA;VENTOLIN HFA) 108 (90 Base) MCG/ACT inhaler Inhale 2 puffs into the lungs every 6 (six) hours as needed for wheezing or shortness of breath.  1 Inhaler 2  . ALPRAZolam (XANAX) 1 MG tablet Take 0.5-1 tablets (0.5-1 mg total) by mouth at bedtime as needed for anxiety. Not a daily medicine, will be de-escalated over time 27 tablet 0  . ARIPiprazole (ABILIFY) 5 MG tablet TAKE 1 TABLET BY MOUTH DAILY 90 tablet 0  . Aspirin-Salicylamide-Caffeine (BC HEADACHE PO) Take 1 packet by mouth daily as needed (headaches).    . benzonatate (TESSALON) 100 MG capsule Take 1 capsule (100 mg total) by mouth 3 (three) times daily as needed for cough. 30 capsule 3  . carvedilol (COREG) 25 MG tablet Take 1 tablet (25 mg total) by mouth 2 (two) times daily with a meal. 180 tablet 3  . cephALEXin (KEFLEX) 500 MG capsule Take 500 mg by mouth 2 (two) times daily as needed (eczema flare).     . Colloidal Oatmeal (GOLD BOND ECZEMA RELIEF) 2 % CREA Apply 1 application topically daily as needed (eczema).    . diphenhydrAMINE (BENADRYL) 25 MG tablet Take 25 mg by mouth 2 (two) times daily as needed for itching.    . hydrochlorothiazide (HYDRODIURIL) 25 MG tablet Take 1 tablet (25 mg total) by mouth 2 (two) times daily. 90 tablet 3  . lisinopril (PRINIVIL,ZESTRIL) 10  MG tablet Take 1 tablet (10 mg total) by mouth daily. 90 tablet 3  . meclizine (ANTIVERT) 25 MG tablet Take 1 tablet (25 mg total) by mouth 3 (three) times daily as needed for dizziness. 30 tablet 1  . montelukast (SINGULAIR) 10 MG tablet Take 1 tablet (10 mg total) by mouth at bedtime. (Patient taking differently: Take 10 mg by mouth at bedtime as needed (allergies). ) 90 tablet 6  . oxyCODONE-acetaminophen (PERCOCET) 10-325 MG tablet Take 1 tablet by mouth 5 (five) times daily as needed for pain.  0  . polyethylene glycol (MIRALAX / GLYCOLAX) packet Take 17 g by mouth 2 (two) times daily.    . promethazine (PHENERGAN) 25 MG tablet take 1 tablet by mouth every 8 hours if needed for nausea and vomiting 30 tablet 1  . sertraline (ZOLOFT) 100 MG tablet Take 1 tablet (100 mg total) by mouth daily. Target dose is  150 mg at night.  Per Dr. Tammi Klippel.  #45 with 11 refills. (Patient taking differently: Take 100 mg by mouth at bedtime. ) 90 tablet 3  . tiZANidine (ZANAFLEX) 4 MG tablet Take 4 mg by mouth 2 (two) times daily.    Marland Kitchen triamcinolone ointment (KENALOG) 0.1 % Apply 1 application topically 2 (two) times daily.    . Fluticasone Furoate (ARNUITY ELLIPTA) 100 MCG/ACT AEPB Inhale 1 puff into the lungs daily. 1 each 5   No current facility-administered medications for this visit.    Allergies: Allergies  Allergen Reactions  . Hydrocodone Itching  . Aspirin Itching and Other (See Comments)    Lower dose doesn't make her itch (she takes it every day)  . Eggs Or Egg-Derived Products Nausea Only and Other (See Comments)    No reaction if in another food   Social History: Social History   Socioeconomic History  . Marital status: Widowed    Spouse name: Not on file  . Number of children: Not on file  . Years of education: Not on file  . Highest education level: Not on file  Occupational History  . Not on file  Social Needs  . Financial resource strain: Not on file  . Food insecurity:    Worry: Not on file    Inability: Not on file  . Transportation needs:    Medical: Not on file    Non-medical: Not on file  Tobacco Use  . Smoking status: Former Smoker    Packs/day: 0.25    Years: 37.00    Pack years: 9.25    Types: Cigarettes    Last attempt to quit: 03/30/2008    Years since quitting: 9.8  . Smokeless tobacco: Never Used  Substance and Sexual Activity  . Alcohol use: No  . Drug use: No  . Sexual activity: Not on file  Lifestyle  . Physical activity:    Days per week: Not on file    Minutes per session: Not on file  . Stress: Not on file  Relationships  . Social connections:    Talks on phone: Not on file    Gets together: Not on file    Attends religious service: Not on file    Active member of club or organization: Not on file    Attends meetings of clubs or organizations:  Not on file    Relationship status: Not on file  Other Topics Concern  . Not on file  Social History Narrative  . Not on file   Lives in a home. Smoking: currently  does not smoke. Occupation: on disability  Environmental History: Water Damage/mildew in the house: no Carpet in the family room: no Carpet in the bedroom: no Heating: electric Cooling: central Pet: no  Family History: Family History  Problem Relation Age of Onset  . Asthma Mother   . Diabetes Mother   . Asthma Father   . Diabetes Father   . Cancer Father        prostate  . Eczema Father   . Hepatitis B Daughter   . Breast cancer Neg Hx    Review of Systems  Constitutional: Negative for appetite change, chills, fever and unexpected weight change.  HENT: Negative for congestion and rhinorrhea.   Eyes: Negative for itching.  Respiratory: Negative for cough, chest tightness, shortness of breath and wheezing.   Cardiovascular: Negative for chest pain.  Gastrointestinal: Negative for abdominal pain.  Genitourinary: Negative for difficulty urinating.  Skin: Positive for rash.  Allergic/Immunologic: Positive for environmental allergies.  Neurological: Negative for headaches.   Objective: BP (!) 160/92 (BP Location: Left Arm, Patient Position: Sitting, Cuff Size: Large) Comment: Dr. Maudie Mercury is aware of high BP  Pulse 83   Temp 98.2 F (36.8 C) (Oral)   Resp 16   Ht 5\' 4"  (1.626 m)   Wt 220 lb 9.6 oz (100.1 kg)   SpO2 95%   BMI 37.87 kg/m  Body mass index is 37.87 kg/m. Physical Exam  Constitutional: She is oriented to person, place, and time. She appears well-developed and well-nourished.  HENT:  Head: Normocephalic and atraumatic.  Right Ear: External ear normal.  Left Ear: External ear normal.  Nose: Mucosal edema present.  Mouth/Throat: Oropharynx is clear and moist.  Eyes: Conjunctivae and EOM are normal.  Neck: Neck supple.  Cardiovascular: Normal rate, regular rhythm and normal heart sounds.  Exam reveals no gallop and no friction rub.  No murmur heard. Pulmonary/Chest: Effort normal and breath sounds normal. She has no wheezes. She has no rales.  Abdominal: Soft.  Lymphadenopathy:    She has no cervical adenopathy.  Neurological: She is alert and oriented to person, place, and time.  Skin: Skin is warm. Rash noted.  Hyperpigmented patches throughout the body. Few areas of flat erythematous rash on torso.  Psychiatric: She has a normal mood and affect. Her behavior is normal.  Nursing note and vitals reviewed.  The plan was reviewed with the patient/family, and all questions/concerned were addressed.  It was my pleasure to see Stacy Campbell today and participate in her care. Please feel free to contact me with any questions or concerns.  Sincerely,  Rexene Alberts, DO Allergy & Immunology  [facility] office: [phone] [facility] office: [phone]  60 minutes spent face-to-face with more than 50% of the time spent discussing asthma, rash, allergies.

## 2018-01-15 NOTE — Assessment & Plan Note (Signed)
Diagnosed with asthma 50+ years ago. Uses albuterol once a week with good benefit.   Today's spirometry showed: 19% improvement in FEV1 post bronchodilator treatment and clinically felt better. . Daily controller medication(s): Start arnuity 1 puff once a day and rinse mouth afterwards.  . Prior to physical activity: May use albuterol rescue inhaler 2 puffs 5 to 15 minutes prior to strenuous physical activities. Marland Kitchen Rescue medications: May use albuterol rescue inhaler 2 puffs or nebulizer every 4 to 6 hours as needed for shortness of breath, chest tightness, coughing, and wheezing. Monitor frequency of use.

## 2018-01-15 NOTE — Patient Instructions (Addendum)
Today's testing showed:  Positive to grass, tree, dust mites, cockroaches, ragweed, weed, mold, cat, dog   Start zyrtec 10mg  daily and may increase to twice a day if needed as long it does not make you tired.  If itching not better then call us and will change medications around.  Get bloodwork as below.  If rash not improved will consider patch testing.  Asthma: . Daily controller medication(s): Start arnuity 1 puff once a day and rinse mouth afterwards.  . Prior to physical activity: May use albuterol rescue inhaler 2 puffs 5 to 15 minutes prior to strenuous physical activities. Marland Kitchen Rescue medications: May use albuterol rescue inhaler 2 puffs or nebulizer every 4 to 6 hours as needed for shortness of breath, chest tightness, coughing, and wheezing. Monitor frequency of use.  . Asthma control goals:  o Full participation in all desired activities (may need albuterol before activity) o Albuterol use two times or less a week on average (not counting use with activity) o Cough interfering with sleep two times or less a month o Oral steroids no more than once a year o No hospitalizations  Follow up in 4 weeks Skin care recommendations  Bath time: . Always use lukewarm water. AVOID very hot or cold water. Marland Kitchen Keep bathing time to 5-10 minutes. . Do NOT use bubble bath. . Use a mild soap and use just enough to wash the dirty areas. . Do NOT scrub skin vigorously.  . After bathing, pat dry your skin with a towel. Do NOT rub or scrub the skin.  Moisturizers and prescriptions:  . ALWAYS apply moisturizers immediately after bathing (within 3 minutes). This helps to lock-in moisture. . Use the moisturizer several times a day over the whole body. Kermit Balo summer moisturizers include: Aveeno, CeraVe, Cetaphil. Kermit Balo winter moisturizers include: Aquaphor, Vaseline, Cerave, Cetaphil, Eucerin, Vanicream. . When using moisturizers along with medications, the moisturizer should be applied about one  hour after applying the medication to prevent diluting effect of the medication or moisturize around where you applied the medications. When not using medications, the moisturizer can be continued twice daily as maintenance.  Laundry and clothing: . Avoid laundry products with added color or perfumes. . Use unscented hypo-allergenic laundry products such as Tide free, Cheer free & gentle, and All free and clear.  . If the skin still seems dry or sensitive, you can try double-rinsing the clothes. . Avoid tight or scratchy clothing such as wool. . Do not use fabric softeners or dyer sheets.  Reducing Pollen Exposure . Pollen seasons: trees (spring), grass (summer) and ragweed/weeds (fall). Marland Kitchen Keep windows closed in your home and car to lower pollen exposure.  Susa Simmonds air conditioning in the bedroom and throughout the house if possible.  . Avoid going out in dry windy days - especially early morning. . Pollen counts are highest between 5 - 10 AM and on dry, hot and windy days.  . Save outside activities for late afternoon or after a heavy rain, when pollen levels are lower.  . Avoid mowing of grass if you have grass pollen allergy. Marland Kitchen Be aware that pollen can also be transported indoors on people and pets.  . Dry your clothes in an automatic dryer rather than hanging them outside where they might collect pollen.  . Rinse hair and eyes before bedtime. Control of House Dust Mite Allergen . Dust mite allergens are a common trigger of allergy and asthma symptoms. While they can be found throughout  the house, these microscopic creatures thrive in warm, humid environments such as bedding, upholstered furniture and carpeting. . Because so much time is spent in the bedroom, it is essential to reduce mite levels there.  . Encase pillows, mattresses, and box springs in special allergen-proof fabric covers or airtight, zippered plastic covers.  . Bedding should be washed weekly in hot water (130 F) and  dried in a hot dryer. Allergen-proof covers are available for comforters and pillows that can't be regularly washed.  Wendee Copp the allergy-proof covers every few months. Minimize clutter in the bedroom. Keep pets out of the bedroom.  Marland Kitchen Keep humidity less than 50% by using a dehumidifier or air conditioning. You can buy a humidity measuring device called a hygrometer to monitor this.  . If possible, replace carpets with hardwood, linoleum, or washable area rugs. If that's not possible, vacuum frequently with a vacuum that has a HEPA filter. . Remove all upholstered furniture and non-washable window drapes from the bedroom. . Remove all non-washable stuffed toys from the bedroom.  Wash stuffed toys weekly. Pet Allergen Avoidance: . Contrary to popular opinion, there are no "hypoallergenic" breeds of dogs or cats. That is because people are not allergic to an animal's hair, but to an allergen found in the animal's saliva, dander (dead skin flakes) or urine. Pet allergy symptoms typically occur within minutes. For some people, symptoms can build up and become most severe 8 to 12 hours after contact with the animal. People with severe allergies can experience reactions in public places if dander has been transported on the pet owners' clothing. Marland Kitchen Keeping an animal outdoors is only a partial solution, since homes with pets in the yard still have higher concentrations of animal allergens. . Before getting a pet, ask your allergist to determine if you are allergic to animals. If your pet is already considered part of your family, try to minimize contact and keep the pet out of the bedroom and other rooms where you spend a great deal of time. . As with dust mites, vacuum carpets often or replace carpet with a hardwood floor, tile or linoleum. . High-efficiency particulate air (HEPA) cleaners can reduce allergen levels over time. . While dander and saliva are the source of cat and dog allergens, urine is the source  of allergens from rabbits, hamsters, mice and [place] pigs; so ask a non-allergic family member to clean the animal's cage. . If you have a pet allergy, talk to your allergist about the potential for allergy immunotherapy (allergy shots). This strategy can often provide long-term relief. Cockroach Allergen Avoidance Cockroaches are often found in the homes of densely populated urban areas, schools or commercial buildings, but these creatures can lurk almost anywhere. This does not mean that you have a dirty house or living area. . Block all areas where roaches can enter the home. This includes crevices, wall cracks and windows.  . Cockroaches need water to survive, so fix and seal all leaky faucets and pipes. Have an exterminator go through the house when your family and pets are gone to eliminate any remaining roaches. Marland Kitchen Keep food in lidded containers and put pet food dishes away after your pets are done eating. Vacuum and sweep the floor after meals, and take out garbage and recyclables. Use lidded garbage containers in the kitchen. Wash dishes immediately after use and clean under stoves, refrigerators or toasters where crumbs can accumulate. Wipe off the stove and other kitchen surfaces and cupboards regularly. Mold Control .  Mold and fungi can grow on a variety of surfaces provided certain temperature and moisture conditions exist.  . Outdoor molds grow on plants, decaying vegetation and soil. The major outdoor mold, Alternaria and Cladosporium, are found in very high numbers during hot and dry conditions. Generally, a late summer - fall peak is seen for common outdoor fungal spores. Rain will temporarily lower outdoor mold spore count, but counts rise rapidly when the rainy period ends. . The most important indoor molds are Aspergillus and Penicillium. Dark, humid and poorly ventilated basements are ideal sites for mold growth. The next most common sites of mold growth are the bathroom and the  kitchen. Outdoor (Seasonal) Mold Control . Use air conditioning and keep windows closed. . Avoid exposure to decaying vegetation. Marland Kitchen Avoid leaf raking. . Avoid grain handling. . Consider wearing a face mask if working in moldy areas.  Indoor (Perennial) Mold Control  . Maintain humidity below 50%. . Get rid of mold growth on hard surfaces with water, detergent and, if necessary, 5% bleach (do not mix with other cleaners). Then dry the area completely. If mold covers an area more than 10 square feet, consider hiring an indoor environmental professional. . For clothing, washing with soap and water is best. If moldy items cannot be cleaned and dried, throw them away. . Remove sources e.g. contaminated carpets. . Repair and seal leaking roofs or pipes. Using dehumidifiers in damp basements may be helpful, but empty the water and clean units regularly to prevent mildew from forming. All rooms, especially basements, bathrooms and kitchens, require ventilation and cleaning to deter mold and mildew growth. Avoid carpeting on concrete or damp floors, and storing items in damp areas.

## 2018-01-15 NOTE — Assessment & Plan Note (Signed)
.   See assessment and plan as above. 

## 2018-01-15 NOTE — Assessment & Plan Note (Signed)
Eggs apparently cause pruritus and has been avoiding it.  Today's skin testing was negative to eggs.  Continue avoidance.  For mild symptoms you can take over the counter antihistamines such as Benadryl and monitor symptoms closely. If symptoms worsen or if you have severe symptoms including breathing issues, throat closure, significant swelling, whole body hives, severe diarrhea and vomiting, lightheadedness then seek immediate medical care.

## 2018-01-15 NOTE — Assessment & Plan Note (Signed)
Rhinoconjunctivitis symptoms for the past 50 years mainly in the spring and summer.  Patient has used Singulair and Claritin with some benefit.  Today's skin testing showed: Positive to grass, tree, dust mites, cockroaches, ragweed, weed, mold, cat, dog.  Discussed environmental control measures.  Start zyrtec 10mg  daily and may increase to twice a day if needed as long it does not make you tired.

## 2018-01-15 NOTE — Patient Instructions (Addendum)
Stacy Campbell  01/15/2018   Your procedure is scheduled on: Wednesday 01/22/2018  Report to [facility] Main  Entrance              Report to admitting at  [place]  AM    Call this number if you have problems the morning of surgery [phone]    Remember: Do not eat food or drink liquids :After Midnight.              BRUSH YOUR TEETH MORNING OF SURGERY AND RINSE YOUR MOUTH OUT, NO CHEWING GUM CANDY OR MINTS.     Take these medicines the morning of surgery with A SIP OF WATER:  Carvedilol (Coreg), Fluticasone Furoate (Armuity Ellipta) inhaler and use Albuterol inhaler if needed and bring inhaler with you to the hospital                                You may not have any metal on your body including hair pins and              piercings  Do not wear jewelry, make-up, lotions, powders or perfumes, deodorant             Do not wear nail polish.  Do not shave  48 hours prior to surgery.              Do not bring valuables to the hospital. [place].  Contacts, dentures or bridgework may not be worn into surgery.  Leave suitcase in the car. After surgery it may be brought to your room.     Patients discharged the day of surgery will not be allowed to drive home. IF YOU ARE HAVING SURGERY AND GOING HOME THE SAME DAY, YOU MUST HAVE AN ADULT TO DRIVE YOU HOME AND BE WITH YOU FOR 24 HOURS. YOU MAY GO HOME BY TAXI OR UBER OR ORTHERWISE, BUT AN ADULT MUST ACCOMPANY YOU HOME AND STAY WITH YOU FOR 24 HOURS.  Name and phone number of your driver: daughter- Alwyn Ren, or daughter- Charisse Klinefelter or brother- Adela Ports               Please read over the following fact sheets you were given: _____________________________________________________________________             [facility] - Preparing for Surgery Before surgery, you can play an important role.  Because skin is not sterile, your skin needs to be as  free of germs as possible.  You can reduce the number of germs on your skin by washing with CHG (chlorahexidine gluconate) soap before surgery.  CHG is an antiseptic cleaner which kills germs and bonds with the skin to continue killing germs even after washing. Please DO NOT use if you have an allergy to CHG or antibacterial soaps.  If your skin becomes reddened/irritated stop using the CHG and inform your nurse when you arrive at Short Stay. Do not shave (including legs and underarms) for at least 48 hours prior to the first CHG shower.  You may shave your face/neck. Please follow these instructions carefully:  1.  Shower with CHG Soap the night before surgery and the  morning of Surgery.  2.  If you choose  to wash your hair, wash your hair first as usual with your  normal  shampoo.  3.  After you shampoo, rinse your hair and body thoroughly to remove the  shampoo.                           4.  Use CHG as you would any other liquid soap.  You can apply chg directly  to the skin and wash                       Gently with a scrungie or clean washcloth.  5.  Apply the CHG Soap to your body ONLY FROM THE NECK DOWN.   Do not use on face/ open                           Wound or open sores. Avoid contact with eyes, ears mouth and genitals (private parts).                       Wash face,  Genitals (private parts) with your normal soap.             6.  Wash thoroughly, paying special attention to the area where your surgery  will be performed.  7.  Thoroughly rinse your body with warm water from the neck down.  8.  DO NOT shower/wash with your normal soap after using and rinsing off  the CHG Soap.                9.  Pat yourself dry with a clean towel.            10.  Wear clean pajamas.            11.  Place clean sheets on your bed the night of your first shower and do not  sleep with pets. Day of Surgery : Do not apply any lotions/deodorants the morning of surgery.  Please wear clean clothes to the  hospital/surgery center.  FAILURE TO FOLLOW THESE INSTRUCTIONS MAY RESULT IN THE CANCELLATION OF YOUR SURGERY PATIENT SIGNATURE_________________________________  NURSE SIGNATURE__________________________________  ________________________________________________________________________

## 2018-01-16 ENCOUNTER — Encounter (HOSPITAL_COMMUNITY): Payer: Self-pay

## 2018-01-16 ENCOUNTER — Other Ambulatory Visit: Payer: Self-pay

## 2018-01-16 ENCOUNTER — Encounter (HOSPITAL_COMMUNITY)
Admission: RE | Admit: 2018-01-16 | Discharge: 2018-01-16 | Disposition: A | Discharge status: Home / Self Care | Payer: Medicare HMO | Source: Ambulatory Visit | Attending: Surgery | Admitting: Surgery

## 2018-01-16 DIAGNOSIS — Z01812 Encounter for preprocedural laboratory examination: Secondary | ICD-10-CM | POA: Insufficient documentation

## 2018-01-16 HISTORY — DX: Cardiac arrhythmia, unspecified: I49.9

## 2018-01-16 HISTORY — DX: Unspecified viral hepatitis C without hepatic coma: B19.20

## 2018-01-16 LAB — CBC
HCT: 48.5 % — ABNORMAL HIGH (ref 36.0–46.0)
Hemoglobin: 14.8 g/dL (ref 12.0–15.0)
MCH: 23.9 pg — ABNORMAL LOW (ref 26.0–34.0)
MCHC: 30.5 g/dL (ref 30.0–36.0)
MCV: 78.2 fL — ABNORMAL LOW (ref 80.0–100.0)
NRBC: 0 % (ref 0.0–0.2)
Platelets: 281 10*3/uL (ref 150–400)
RBC: 6.2 MIL/uL — AB (ref 3.87–5.11)
RDW: 14.8 % (ref 11.5–15.5)
WBC: 7.8 10*3/uL (ref 4.0–10.5)

## 2018-01-16 LAB — HEPATIC FUNCTION PANEL
ALT: 61 IU/L — ABNORMAL HIGH (ref 0–32)
AST: 73 IU/L — ABNORMAL HIGH (ref 0–40)
Albumin: 4.6 g/dL (ref 3.6–4.8)
Alkaline Phosphatase: 104 IU/L (ref 39–117)
Bilirubin Total: 0.3 mg/dL (ref 0.0–1.2)
Bilirubin, Direct: 0.13 mg/dL (ref 0.00–0.40)
Total Protein: 8.3 g/dL (ref 6.0–8.5)

## 2018-01-16 LAB — CBC WITH DIFFERENTIAL
Basophils Absolute: 0 10*3/uL (ref 0.0–0.2)
Basos: 0 %
EOS (ABSOLUTE): 0.4 10*3/uL (ref 0.0–0.4)
Eos: 6 %
Hematocrit: 46.3 % (ref 34.0–46.6)
Hemoglobin: 15 g/dL (ref 11.1–15.9)
Immature Grans (Abs): 0 10*3/uL (ref 0.0–0.1)
Immature Granulocytes: 0 %
Lymphocytes Absolute: 0.9 10*3/uL (ref 0.7–3.1)
Lymphs: 14 %
MCH: 23.8 pg — ABNORMAL LOW (ref 26.6–33.0)
MCHC: 32.4 g/dL (ref 31.5–35.7)
MCV: 74 fL — ABNORMAL LOW (ref 79–97)
Monocytes Absolute: 0.6 10*3/uL (ref 0.1–0.9)
Monocytes: 9 %
Neutrophils Absolute: 4.7 10*3/uL (ref 1.4–7.0)
Neutrophils: 71 %
RBC: 6.29 x10E6/uL — ABNORMAL HIGH (ref 3.77–5.28)
RDW: 15 % (ref 11.7–15.4)
WBC: 6.7 10*3/uL (ref 3.4–10.8)

## 2018-01-16 LAB — COMPREHENSIVE METABOLIC PANEL
ALT: 61 U/L — ABNORMAL HIGH (ref 0–44)
AST: 65 U/L — ABNORMAL HIGH (ref 15–41)
Albumin: 4.2 g/dL (ref 3.5–5.0)
Alkaline Phosphatase: 79 U/L (ref 38–126)
Anion gap: 11 (ref 5–15)
BUN: 22 mg/dL (ref 8–23)
CO2: 24 mmol/L (ref 22–32)
Calcium: 9.3 mg/dL (ref 8.9–10.3)
Chloride: 104 mmol/L (ref 98–111)
Creatinine, Ser: 1.1 mg/dL — ABNORMAL HIGH (ref 0.44–1.00)
GFR, EST NON AFRICAN AMERICAN: 54 mL/min — AB (ref >60)
Glucose, Bld: 142 mg/dL — ABNORMAL HIGH (ref 70–99)
Potassium: 3.7 mmol/L (ref 3.5–5.1)
Sodium: 139 mmol/L (ref 135–145)
Total Bilirubin: 0.6 mg/dL (ref 0.3–1.2)
Total Protein: 8.4 g/dL — ABNORMAL HIGH (ref 6.5–8.1)

## 2018-01-16 LAB — THYROID CASCADE PROFILE: TSH: 1.96 u[IU]/mL (ref 0.450–4.500)

## 2018-01-16 NOTE — Progress Notes (Signed)
01/09/2018- noted in Epic-BMP  10/29/2017- noted in Epic- Stress test  10/11/2017- noted in [facility] note from Dr. Geraldo Pitter and EKG  01/30/2017- noted in [facility]- ECHO

## 2018-01-17 ENCOUNTER — Encounter: Payer: Self-pay | Admitting: Allergy

## 2018-01-17 NOTE — Progress Notes (Signed)
Anesthesia Chart Review   Case:  [id number] Date/Time:  01/22/18 0815   Procedure:  LAPAROSCOPIC CHOLECYSTECTOMY ERAS PATHWAY (N/A )   Anesthesia type:  General   Pre-op diagnosis:  Chronic cholecystitis   Location:  [facility] ROOM 01 / [facility]   Surgeon:  Clovis Riley, MD      DISCUSSION:63 yo former smoker (9.25 pack years, quit 03/30/08) with h/o HTN, GERD, anxiety, depression, asthma, remote h/o A-fib (cardioversion in 1990s), Hepatitis C (s/p treatment with sustained viral response) scheduled for above surgery on 01/22/2018 with Dr. Romana Juniper.   Pt was last seen by cardiologist on 10/11/17, Dr. Reita Cliche Revankar.  Per his note she denies chest pain, orthopnea, or PND and leads a sedentary lifestyle due to orthopedic issues.  He states, "In view of multiple risk factors for coronary artery disease she will undergo Lexiscan sestamibi testing.  If this is negative, then she is not at high risk for coronary events during the month since surgery.  Meticulous hemodynamic monitoring will further reduce the risk of coronary events.  She will be seen in follow-up appointment on a as needed basis." Stress test on 10/29/17 with EF of 63%, no ischemia, low risk study.    Pt can proceed with planned procedure barring acute status change.  VS: BP (!) 177/93 Comment: patient stated that she had not taken her blood pressure medication yet  Pulse 70   Temp 37 C (Oral)   Resp 18   Ht 5\' 4"  (1.626 m)   Wt 98.4 kg   SpO2 97%   BMI 37.25 kg/m   PROVIDERS: Sherene Sires, DO is PCP   Revankar, MD is Cardiologist   LABS: Labs reviewed: Acceptable for surgery. (all labs ordered are listed, but only abnormal results are displayed)  Labs Reviewed  COMPREHENSIVE METABOLIC PANEL - Abnormal; Notable for the following components:      Result Value   Glucose, Bld 142 (*)    Creatinine, Ser 1.10 (*)    Total Protein 8.4 (*)    AST 65 (*)    ALT 61 (*)    GFR calc non Af Amer 54 (*)    All other components  within normal limits  CBC - Abnormal; Notable for the following components:   RBC 6.20 (*)    HCT 48.5 (*)    MCV 78.2 (*)    MCH 23.9 (*)    All other components within normal limits     IMAGES: Nuclear Medicine Hepatobiliary Imaging 08/30/17 IMPRESSION: Nonvisualization of the gallbladder compatible with cystic duct obstruction and cholecystitis.  EKG: 10/11/17 Rate 82 bpm Normal sinus rhythm Low voltage QRS Cannot rule out Anterior infarct, age undetermined Abnormal ECG  CV: Stress Test 10/29/17  Nuclear stress EF: 63%.  The left ventricular ejection fraction is normal (55-65%).  There was no ST segment deviation noted during stress.  The study is normal.  This is a low risk study.   Echo 01/30/17 Study Conclusions  - Left ventricle: The cavity size was normal. There was mild focal basal hypertrophy of the septum. Systolic function was vigorous. The estimated ejection fraction was in the range of 65% to 70% with acceleration of flow through outflow tract (peak velocity 2.30m/s). Wall motion was normal; there were no regional wall motion abnormalities. Features are consistent with a pseudonormal left ventricular filling pattern, with concomitant abnormal relaxation and increased filling pressure (grade 2 diastolic dysfunction). - Aortic valve: Trileaflet; mildly thickened, mildly calcified leaflets. Transvalvular velocity was  within the normal range.There was no stenosis. - Mitral valve: Calcified annulus. Mildly thickened leaflets. There was trivial regurgitation. - Pulmonary arteries: PA peak pressure: 55 mm Hg (S).  Past Medical History:  Diagnosis Date  . Angio-edema   . Anxiety   . Arthritis    "lower back" (01/29/2017)  . Asthma   . Bone spur    spine  . Chronic lower back pain   . Depression   . Dysrhythmia    Atrial fibrillation  . Eczema   . GERD (gastroesophageal reflux disease)   . Headache    "at least weekly" (01/29/2017)  . Hepatitis C    was  treated for this  . History of atrial fibrillation without current medication    cardioversion in 1990s  . Hypertension   . Insomnia   . Urticaria     Past Surgical History:  Procedure Laterality Date  . CARDIOVERSION  1990s  . CYSTOSCOPY W/ URETERAL STENT PLACEMENT Left 01/29/2017   Procedure: CYSTOSCOPY WITH RETROGRADE PYELOGRAM/URETERAL STENT PLACEMENT;  Surgeon: Ardis Hughs, MD;  Location: [facility];  Service: Urology;  Laterality: Left;  . CYSTOSCOPY WITH RETROGRADE PYELOGRAM, URETEROSCOPY AND STENT PLACEMENT Left 01/29/2017   CYSTOSCOPY WITH RETROGRADE PYELOGRAM/URETERAL STENT PLACEMENT    MEDICATIONS: . albuterol (PROVENTIL HFA;VENTOLIN HFA) 108 (90 Base) MCG/ACT inhaler  . ALPRAZolam (XANAX) 1 MG tablet  . ARIPiprazole (ABILIFY) 5 MG tablet  . Aspirin-Salicylamide-Caffeine (BC HEADACHE PO)  . benzonatate (TESSALON) 100 MG capsule  . carvedilol (COREG) 25 MG tablet  . cephALEXin (KEFLEX) 500 MG capsule  . Colloidal Oatmeal (GOLD BOND ECZEMA RELIEF) 2 % CREA  . diphenhydrAMINE (BENADRYL) 25 MG tablet  . Fluticasone Furoate (ARNUITY ELLIPTA) 100 MCG/ACT AEPB  . hydrochlorothiazide (HYDRODIURIL) 25 MG tablet  . lisinopril (PRINIVIL,ZESTRIL) 10 MG tablet  . meclizine (ANTIVERT) 25 MG tablet  . montelukast (SINGULAIR) 10 MG tablet  . oxyCODONE-acetaminophen (PERCOCET) 10-325 MG tablet  . polyethylene glycol (MIRALAX / GLYCOLAX) packet  . promethazine (PHENERGAN) 25 MG tablet  . sertraline (ZOLOFT) 100 MG tablet  . tiZANidine (ZANAFLEX) 4 MG tablet  . triamcinolone ointment (KENALOG) 0.1 %   No current facility-administered medications for this encounter.      Maia Plan [facility] Pre-Surgical Testing [phone] 01/17/18 2:50 PM

## 2018-01-17 NOTE — Anesthesia Preprocedure Evaluation (Addendum)
Anesthesia Evaluation    Airway Mallampati: II  TM Distance: >3 FB Neck ROM: Full    Dental no notable dental hx. (+) Edentulous Upper, Edentulous Lower   Pulmonary asthma , COPD,  COPD inhaler, former smoker,    Pulmonary exam normal breath sounds clear to auscultation       Cardiovascular hypertension, Pt. on medications Normal cardiovascular exam Rhythm:Regular Rate:Normal  Stress test on 10/29/17 with EF of 63%, no ischemia, low risk study.   Neuro/Psych Anxiety Depression    GI/Hepatic (+) Hepatitis - (treated), C  Endo/Other    Renal/GU      Musculoskeletal   Abdominal   Peds  Hematology   Anesthesia Other Findings   Reproductive/Obstetrics                            Anesthesia Physical Anesthesia Plan  ASA: III  Anesthesia Plan: General   Post-op Pain Management:    Induction: Intravenous  PONV Risk Score and Plan: 4 or greater and Ondansetron, Dexamethasone and Treatment may vary due to age or medical condition  Airway Management Planned: Oral ETT  Additional Equipment:   Intra-op Plan:   Post-operative Plan: Extubation in OR  Informed Consent: I have reviewed the patients History and Physical, chart, labs and discussed the procedure including the risks, benefits and alternatives for the proposed anesthesia with the patient or authorized representative who has indicated his/her understanding and acceptance.     Dental advisory given  Plan Discussed with: CRNA  Anesthesia Plan Comments: (See PST note 01/16/18, Konrad Felix, PA-C)       Anesthesia Quick Evaluation

## 2018-01-21 ENCOUNTER — Ambulatory Visit: Payer: Self-pay | Admitting: Surgery

## 2018-01-21 NOTE — H&P (Signed)
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 Paulita Fujita for chronic cholecystitis.  She has been having persistent nausea, decreased appetite, and occasional postprandial right upper quadrant pain which is relatively new.  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  Her other complaints include bloating and increased abdominal girth, constipation.  No vomiting.  No melena or hematochezia. Recent labs are essentially normal including her INR 1.1, creatinine 0.75, sodium 138, bicarb 28, albumin 3.7, alk phos 6646, ALT 35, total bilirubin 0.3, white count 5.1, globin 11.9, platelets 219 Right upper quadrant ultrasound demonstrates gallstones and contracted gallbladder, no ascites.  She does have splenomegaly.  HIDA scan demonstrates nonvisualization of the gallbladder.  Most recent upper endoscopy was a few months ago and did not show any varices. She has not had any prior abdominal surgery  She has subsequently been evaluated by cardiology (Dr Jyl Heinz) and underwent a nuclear stress test which was low risk and she is therefore cleared from their standpoint to proceed. 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        Allergies  Allergen Reactions  . Hydrocodone Itching  . Aspirin Itching    Lower dose doesn't make her itch (she takes it every day)  . Eggs Or Egg-Derived Products Nausea Only    No reaction if in another food        Past Medical History:  Diagnosis Date  . Anxiety   .  Arthritis    "lower back" (01/29/2017)  . Asthma   . Bone spur    spine  . Chronic lower back pain   . Depression   . Eczema   . GERD (gastroesophageal reflux disease)   . Headache    "at least weekly" (01/29/2017)  . History of atrial fibrillation without current medication    cardioversion in 1990s  . Hypertension   . Insomnia          Past Surgical History:  Procedure Laterality Date  . CARDIOVERSION  1990s  . CYSTOSCOPY W/ URETERAL STENT PLACEMENT Left 01/29/2017   Procedure: CYSTOSCOPY WITH RETROGRADE PYELOGRAM/URETERAL STENT PLACEMENT;  Surgeon: Ardis Hughs, MD;  Location: [facility] [facility];  Service: Urology;  Laterality: Left;  . CYSTOSCOPY WITH RETROGRADE PYELOGRAM, URETEROSCOPY AND STENT PLACEMENT Left 01/29/2017   CYSTOSCOPY WITH RETROGRADE PYELOGRAM/URETERAL STENT PLACEMENT         Family History  Problem Relation Age of Onset  . Asthma Mother   . Diabetes Mother   . Asthma Father   . Diabetes Father   . Cancer Father        prostate  . Hepatitis B Daughter   . Breast cancer Neg Hx     Social History        Socioeconomic History  . Marital status: Widowed    Spouse name: Not on file  . Number of children: Not on file  . Years of education: Not on file  . Highest education level: Not on file  Occupational History  . Not on file  Social Needs  . Financial  resource strain: Not on file  . Food insecurity:    Worry: Not on file    Inability: Not on file  . Transportation needs:    Medical: Not on file    Non-medical: Not on file  Tobacco Use  . Smoking status: Former Smoker    Packs/day: 0.25    Years: 37.00    Pack years: 9.25    Types: Cigarettes    Last attempt to quit: 03/30/2008    Years since quitting: 9.4  . Smokeless tobacco: Never Used  Substance and Sexual Activity  . Alcohol use: No  . Drug use: No  . Sexual activity: Not on file  Lifestyle  . Physical activity:    Days per  week: Not on file    Minutes per session: Not on file  . Stress: Not on file  Relationships  . Social connections:    Talks on phone: Not on file    Gets together: Not on file    Attends religious service: Not on file    Active member of club or organization: Not on file    Attends meetings of clubs or organizations: Not on file    Relationship status: Not on file  Other Topics Concern  . Not on file  Social History Narrative  . Not on file          Current Outpatient Medications on File Prior to Visit  Medication Sig Dispense Refill  . albuterol (PROVENTIL HFA;VENTOLIN HFA) 108 (90 Base) MCG/ACT inhaler Inhale 2 puffs into the lungs every 6 (six) hours as needed for wheezing or shortness of breath. 1 Inhaler 2  . ALPRAZolam (XANAX) 1 MG tablet take 1 tablet by mouth twice a day if needed for anxiety 30 tablet 3  . amLODipine (NORVASC) 10 MG tablet take 1 tablet by mouth once daily 90 tablet 3  . ARIPiprazole (ABILIFY) 5 MG tablet TAKE 1 TABLET BY MOUTH DAILY 90 tablet 0  . benzonatate (TESSALON) 100 MG capsule Take 1 capsule (100 mg total) by mouth 3 (three) times daily as needed for cough. 30 capsule 3  . carvedilol (COREG) 25 MG tablet Take 1 tablet (25 mg total) by mouth 2 (two) times daily with a meal. 180 tablet 3  . cephALEXin (KEFLEX) 500 MG capsule Take 1 capsule (500 mg total) by mouth 2 (two) times daily. (Patient not taking: Reported on 08/20/2017) 14 capsule 0  . clobetasol cream (TEMOVATE) 7.51 % Apply 1 application topically 2 (two) times daily as needed. Only use on hands 60 g 3  . docusate sodium (COLACE) 250 MG capsule Take 1 capsule (250 mg total) by mouth daily. (Patient not taking: Reported on 07/05/2017) 10 capsule 0  . Famotidine 20 MG CHEW Chew 1 tablet (20 mg total) by mouth 2 (two) times daily. (Patient not taking: Reported on 07/05/2017) 60 each 2  . hydrochlorothiazide (HYDRODIURIL) 25 MG tablet Take 1 tablet (25 mg total) by mouth 2 (two) times  daily. 90 tablet 3  . meclizine (ANTIVERT) 25 MG tablet Take 1 tablet (25 mg total) by mouth 3 (three) times daily as needed for dizziness. 30 tablet 1  . montelukast (SINGULAIR) 10 MG tablet Take 1 tablet (10 mg total) by mouth at bedtime. (Patient taking differently: Take 10 mg by mouth daily as needed (allergies). ) 90 tablet 6  . oxycodone (OXY-IR) 5 MG capsule Take 10 mg by mouth every 4 (four) hours as needed.    Marland Kitchen oxyCODONE-acetaminophen (PERCOCET) 7.5-325 MG  tablet Take 1-2 tablets by mouth every 6 (six) hours as needed for severe pain. (Patient not taking: Reported on 07/05/2017) 30 tablet 0  . oxyCODONE-acetaminophen (PERCOCET/ROXICET) 5-325 MG tablet Take 2 tablets by mouth 4 (four) times daily.    . permethrin (ELIMITE) 5 % cream Apply from neck down over entire body and leave on for 8-14 hours then rinse off. (Patient not taking: Reported on 08/20/2017) 60 g 0  . promethazine (PHENERGAN) 25 MG tablet take 1 tablet by mouth every 8 hours if needed for nausea and vomiting 30 tablet 1  . sertraline (ZOLOFT) 100 MG tablet Take 1 tablet (100 mg total) by mouth daily. Target dose is 150 mg at night.  Per Dr. Tammi Klippel.  #45 with 11 refills. 90 tablet 3  . tiZANidine (ZANAFLEX) 4 MG tablet Take 4 mg by mouth 2 (two) times daily.    Marland Kitchen triamcinolone ointment (KENALOG) 0.5 % Apply 1 application topically 2 (two) times daily. (Patient taking differently: Apply 1 application topically 2 (two) times daily as needed (rash). ) 30 g 0  . zolpidem (AMBIEN) 10 MG tablet Take 1 tablet (10 mg total) by mouth at bedtime as needed for sleep. 30 tablet 5   No current facility-administered medications on file prior to visit.     Review of Systems: a complete, 10pt review of systems was completed with pertinent positives and negatives as documented in the HPI  Physical Exam: BMI is 37.61. Gen: alert and chronically ill appearing Eye: extraocular motion intact, no scleral icterus ENT: moist mucus  membranes, dentition intact Neck: no mass or thyromegaly Chest: unlabored respirations, symmetrical air entry, clear bilaterally CV: regular rate and rhythm, mild bilateral nonpitting lower extremity edema Abdomen: soft, obese, mildly diffusely tender, nondistended. no caput medusa. No mass or organomegaly MSK: strength symmetrical throughout, no deformity Neuro: grossly intact, normal gait Psych: normal mood and affect, appropriate insight Skin: warm and dry, no rash or lesion on limited exam   CBC Latest Ref Rng & Units 02/01/2017 01/30/2017 01/29/2017  WBC 4.0 - 10.5 K/uL 5.2 4.1 3.8(L)  Hemoglobin 12.0 - 15.0 g/dL 13.5 13.0 12.4  Hematocrit 36.0 - 46.0 % 43.4 40.7 39.5  Platelets 150 - 400 K/uL 227 184 155    CMP Latest Ref Rng & Units 02/01/2017 01/30/2017 01/29/2017  Glucose 65 - 99 mg/dL 122(H) 133(H) 116(H)  BUN 6 - 20 mg/dL _0 Creatinine 0.44 - 1.00 mg/dL 0.87 1.17(H) 1.11(H)  Sodium 135 - 145 mmol/L 137 140 139  Potassium 3.5 - 5.1 mmol/L 3.3(L) 4.0 3.4(L)  Chloride 101 - 111 mmol/L 96(L) 101 105  CO2 22 - 32 mmol/L _1 Calcium 8.9 - 10.3 mg/dL 9.1 8.7(L) 9.1  Total Protein 6.5 - 8.1 g/dL - - 7.7  Total Bilirubin 0.3 - 1.2 mg/dL - - 0.6  Alkaline Phos 38 - 126 U/L - - 84  AST 15 - 41 U/L - - 43(H)  ALT 14 - 54 U/L - - 38    RecentLabs       Lab Results  Component Value Date   INR 1.11 10/08/2011   INR 1.08 08/15/2011   INR 1.10 07/26/2011      Imaging: ImagingResults(Last48hours)  No results found.     A/P: Chronic cholecystitis. She has known stones and non-filling of the gallbladder on HIDA. RUQ pain and nausea. I advised that the constipation/ bloating are not likely to resolve following cholecystectomy; some of this may be secondary to  steroid use.  I recommend proceeding with laparoscopic cholecystectomy with possible cholangiogram. Discussed risks of surgery including bleeding, pain, scarring, intraabdominal injury specifically to  the common bile duct and sequelae, conversion to open surgery, failure to resolve symptoms, blood clots/ pulmonary embolus, heart attack, pneumonia, stroke, death. She is at increased risk due to her cirrhosis (albeit well compensated), contracted gallbladder, and other medical problems. Cleared and considered low risk  by cardiology based on her nuclear stress test.  Questions welcomed and answered to patient's satisfaction.    Romana Juniper, MD [facility], [place] Pager [phone]

## 2018-01-21 NOTE — H&P (View-Only) (Signed)
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 Paulita Fujita for chronic cholecystitis.  She has been having persistent nausea, decreased appetite, and occasional postprandial right upper quadrant pain which is relatively new.  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  Her other complaints include bloating and increased abdominal girth, constipation.  No vomiting.  No melena or hematochezia. Recent labs are essentially normal including her INR 1.1, creatinine 0.75, sodium 138, bicarb 28, albumin 3.7, alk phos 6646, ALT 35, total bilirubin 0.3, white count 5.1, globin 11.9, platelets 219 Right upper quadrant ultrasound demonstrates gallstones and contracted gallbladder, no ascites.  She does have splenomegaly.  HIDA scan demonstrates nonvisualization of the gallbladder.  Most recent upper endoscopy was a few months ago and did not show any varices. She has not had any prior abdominal surgery  She has subsequently been evaluated by cardiology (Dr Jyl Heinz) and underwent a nuclear stress test which was low risk and she is therefore cleared from their standpoint to proceed. 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        Allergies  Allergen Reactions  . Hydrocodone Itching  . Aspirin Itching    Lower dose doesn't make her itch (she takes it every day)  . Eggs Or Egg-Derived Products Nausea Only    No reaction if in another food        Past Medical History:  Diagnosis Date  . Anxiety   .  Arthritis    "lower back" (01/29/2017)  . Asthma   . Bone spur    spine  . Chronic lower back pain   . Depression   . Eczema   . GERD (gastroesophageal reflux disease)   . Headache    "at least weekly" (01/29/2017)  . History of atrial fibrillation without current medication    cardioversion in 1990s  . Hypertension   . Insomnia          Past Surgical History:  Procedure Laterality Date  . CARDIOVERSION  1990s  . CYSTOSCOPY W/ URETERAL STENT PLACEMENT Left 01/29/2017   Procedure: CYSTOSCOPY WITH RETROGRADE PYELOGRAM/URETERAL STENT PLACEMENT;  Surgeon: Ardis Hughs, MD;  Location: [place];  Service: Urology;  Laterality: Left;  . CYSTOSCOPY WITH RETROGRADE PYELOGRAM, URETEROSCOPY AND STENT PLACEMENT Left 01/29/2017   CYSTOSCOPY WITH RETROGRADE PYELOGRAM/URETERAL STENT PLACEMENT         Family History  Problem Relation Age of Onset  . Asthma Mother   . Diabetes Mother   . Asthma Father   . Diabetes Father   . Cancer Father        prostate  . Hepatitis B Daughter   . Breast cancer Neg Hx     Social History        Socioeconomic History  . Marital status: Widowed    Spouse name: Not on file  . Number of children: Not on file  . Years of education: Not on file  . Highest education level: Not on file  Occupational History  . Not on file  Social Needs  . Financial  resource strain: Not on file  . Food insecurity:    Worry: Not on file    Inability: Not on file  . Transportation needs:    Medical: Not on file    Non-medical: Not on file  Tobacco Use  . Smoking status: Former Smoker    Packs/day: 0.25    Years: 37.00    Pack years: 9.25    Types: Cigarettes    Last attempt to quit: 03/30/2008    Years since quitting: 9.4  . Smokeless tobacco: Never Used  Substance and Sexual Activity  . Alcohol use: No  . Drug use: No  . Sexual activity: Not on file  Lifestyle  . Physical activity:    Days per  week: Not on file    Minutes per session: Not on file  . Stress: Not on file  Relationships  . Social connections:    Talks on phone: Not on file    Gets together: Not on file    Attends religious service: Not on file    Active member of club or organization: Not on file    Attends meetings of clubs or organizations: Not on file    Relationship status: Not on file  Other Topics Concern  . Not on file  Social History Narrative  . Not on file          Current Outpatient Medications on File Prior to Visit  Medication Sig Dispense Refill  . albuterol (PROVENTIL HFA;VENTOLIN HFA) 108 (90 Base) MCG/ACT inhaler Inhale 2 puffs into the lungs every 6 (six) hours as needed for wheezing or shortness of breath. 1 Inhaler 2  . ALPRAZolam (XANAX) 1 MG tablet take 1 tablet by mouth twice a day if needed for anxiety 30 tablet 3  . amLODipine (NORVASC) 10 MG tablet take 1 tablet by mouth once daily 90 tablet 3  . ARIPiprazole (ABILIFY) 5 MG tablet TAKE 1 TABLET BY MOUTH DAILY 90 tablet 0  . benzonatate (TESSALON) 100 MG capsule Take 1 capsule (100 mg total) by mouth 3 (three) times daily as needed for cough. 30 capsule 3  . carvedilol (COREG) 25 MG tablet Take 1 tablet (25 mg total) by mouth 2 (two) times daily with a meal. 180 tablet 3  . cephALEXin (KEFLEX) 500 MG capsule Take 1 capsule (500 mg total) by mouth 2 (two) times daily. (Patient not taking: Reported on 08/20/2017) 14 capsule 0  . clobetasol cream (TEMOVATE) 7.51 % Apply 1 application topically 2 (two) times daily as needed. Only use on hands 60 g 3  . docusate sodium (COLACE) 250 MG capsule Take 1 capsule (250 mg total) by mouth daily. (Patient not taking: Reported on 07/05/2017) 10 capsule 0  . Famotidine 20 MG CHEW Chew 1 tablet (20 mg total) by mouth 2 (two) times daily. (Patient not taking: Reported on 07/05/2017) 60 each 2  . hydrochlorothiazide (HYDRODIURIL) 25 MG tablet Take 1 tablet (25 mg total) by mouth 2 (two) times  daily. 90 tablet 3  . meclizine (ANTIVERT) 25 MG tablet Take 1 tablet (25 mg total) by mouth 3 (three) times daily as needed for dizziness. 30 tablet 1  . montelukast (SINGULAIR) 10 MG tablet Take 1 tablet (10 mg total) by mouth at bedtime. (Patient taking differently: Take 10 mg by mouth daily as needed (allergies). ) 90 tablet 6  . oxycodone (OXY-IR) 5 MG capsule Take 10 mg by mouth every 4 (four) hours as needed.    Marland Kitchen oxyCODONE-acetaminophen (PERCOCET) 7.5-325 MG  tablet Take 1-2 tablets by mouth every 6 (six) hours as needed for severe pain. (Patient not taking: Reported on 07/05/2017) 30 tablet 0  . oxyCODONE-acetaminophen (PERCOCET/ROXICET) 5-325 MG tablet Take 2 tablets by mouth 4 (four) times daily.    . permethrin (ELIMITE) 5 % cream Apply from neck down over entire body and leave on for 8-14 hours then rinse off. (Patient not taking: Reported on 08/20/2017) 60 g 0  . promethazine (PHENERGAN) 25 MG tablet take 1 tablet by mouth every 8 hours if needed for nausea and vomiting 30 tablet 1  . sertraline (ZOLOFT) 100 MG tablet Take 1 tablet (100 mg total) by mouth daily. Target dose is 150 mg at night.  Per Dr. Tammi Klippel.  #45 with 11 refills. 90 tablet 3  . tiZANidine (ZANAFLEX) 4 MG tablet Take 4 mg by mouth 2 (two) times daily.    Marland Kitchen triamcinolone ointment (KENALOG) 0.5 % Apply 1 application topically 2 (two) times daily. (Patient taking differently: Apply 1 application topically 2 (two) times daily as needed (rash). ) 30 g 0  . zolpidem (AMBIEN) 10 MG tablet Take 1 tablet (10 mg total) by mouth at bedtime as needed for sleep. 30 tablet 5   No current facility-administered medications on file prior to visit.     Review of Systems: a complete, 10pt review of systems was completed with pertinent positives and negatives as documented in the HPI  Physical Exam: BMI is 37.61. Gen: alert and chronically ill appearing Eye: extraocular motion intact, no scleral icterus ENT: moist mucus  membranes, dentition intact Neck: no mass or thyromegaly Chest: unlabored respirations, symmetrical air entry, clear bilaterally CV: regular rate and rhythm, mild bilateral nonpitting lower extremity edema Abdomen: soft, obese, mildly diffusely tender, nondistended. no caput medusa. No mass or organomegaly MSK: strength symmetrical throughout, no deformity Neuro: grossly intact, normal gait Psych: normal mood and affect, appropriate insight Skin: warm and dry, no rash or lesion on limited exam   CBC Latest Ref Rng & Units 02/01/2017 01/30/2017 01/29/2017  WBC 4.0 - 10.5 K/uL 5.2 4.1 3.8(L)  Hemoglobin 12.0 - 15.0 g/dL 13.5 13.0 12.4  Hematocrit 36.0 - 46.0 % 43.4 40.7 39.5  Platelets 150 - 400 K/uL 227 184 155    CMP Latest Ref Rng & Units 02/01/2017 01/30/2017 01/29/2017  Glucose 65 - 99 mg/dL 122(H) 133(H) 116(H)  BUN 6 - 20 mg/dL _0 Creatinine 0.44 - 1.00 mg/dL 0.87 1.17(H) 1.11(H)  Sodium 135 - 145 mmol/L 137 140 139  Potassium 3.5 - 5.1 mmol/L 3.3(L) 4.0 3.4(L)  Chloride 101 - 111 mmol/L 96(L) 101 105  CO2 22 - 32 mmol/L _1 Calcium 8.9 - 10.3 mg/dL 9.1 8.7(L) 9.1  Total Protein 6.5 - 8.1 g/dL - - 7.7  Total Bilirubin 0.3 - 1.2 mg/dL - - 0.6  Alkaline Phos 38 - 126 U/L - - 84  AST 15 - 41 U/L - - 43(H)  ALT 14 - 54 U/L - - 38    RecentLabs       Lab Results  Component Value Date   INR 1.11 10/08/2011   INR 1.08 08/15/2011   INR 1.10 07/26/2011      Imaging: ImagingResults(Last48hours)  No results found.     A/P: Chronic cholecystitis. She has known stones and non-filling of the gallbladder on HIDA. RUQ pain and nausea. I advised that the constipation/ bloating are not likely to resolve following cholecystectomy; some of this may be secondary to  steroid use.  I recommend proceeding with laparoscopic cholecystectomy with possible cholangiogram. Discussed risks of surgery including bleeding, pain, scarring, intraabdominal injury specifically to  the common bile duct and sequelae, conversion to open surgery, failure to resolve symptoms, blood clots/ pulmonary embolus, heart attack, pneumonia, stroke, death. She is at increased risk due to her cirrhosis (albeit well compensated), contracted gallbladder, and other medical problems. Cleared and considered low risk  by cardiology based on her nuclear stress test.  Questions welcomed and answered to patient's satisfaction.    Romana Juniper, MD [facility], [place] Pager [phone]

## 2018-01-22 ENCOUNTER — Encounter (HOSPITAL_COMMUNITY): Payer: Self-pay | Admitting: Certified Registered"

## 2018-01-22 ENCOUNTER — Ambulatory Visit (HOSPITAL_COMMUNITY)
Admission: RE | Admit: 2018-01-22 | Discharge: 2018-01-22 | Disposition: A | Discharge status: Home / Self Care | Payer: Medicare HMO | Source: Other Acute Inpatient Hospital | Attending: Surgery | Admitting: Surgery

## 2018-01-22 ENCOUNTER — Ambulatory Visit (HOSPITAL_COMMUNITY): Payer: Medicare HMO | Admitting: Certified Registered Nurse Anesthetist

## 2018-01-22 ENCOUNTER — Ambulatory Visit (HOSPITAL_COMMUNITY): Payer: Medicare HMO | Admitting: Physician Assistant

## 2018-01-22 ENCOUNTER — Encounter (HOSPITAL_COMMUNITY)
Admission: RE | Disposition: A | Discharge status: Home / Self Care | Payer: Self-pay | Source: Other Acute Inpatient Hospital | Attending: Surgery

## 2018-01-22 DIAGNOSIS — I10 Essential (primary) hypertension: Secondary | ICD-10-CM | POA: Insufficient documentation

## 2018-01-22 DIAGNOSIS — Z7982 Long term (current) use of aspirin: Secondary | ICD-10-CM | POA: Insufficient documentation

## 2018-01-22 DIAGNOSIS — Z79891 Long term (current) use of opiate analgesic: Secondary | ICD-10-CM | POA: Diagnosis not present

## 2018-01-22 DIAGNOSIS — G4700 Insomnia, unspecified: Secondary | ICD-10-CM | POA: Diagnosis not present

## 2018-01-22 DIAGNOSIS — Z91012 Allergy to eggs: Secondary | ICD-10-CM | POA: Diagnosis not present

## 2018-01-22 DIAGNOSIS — G8929 Other chronic pain: Secondary | ICD-10-CM | POA: Diagnosis not present

## 2018-01-22 DIAGNOSIS — Z87891 Personal history of nicotine dependence: Secondary | ICD-10-CM | POA: Insufficient documentation

## 2018-01-22 DIAGNOSIS — F419 Anxiety disorder, unspecified: Secondary | ICD-10-CM | POA: Insufficient documentation

## 2018-01-22 DIAGNOSIS — F329 Major depressive disorder, single episode, unspecified: Secondary | ICD-10-CM | POA: Insufficient documentation

## 2018-01-22 DIAGNOSIS — K8010 Calculus of gallbladder with chronic cholecystitis without obstruction: Secondary | ICD-10-CM | POA: Insufficient documentation

## 2018-01-22 DIAGNOSIS — R51 Headache: Secondary | ICD-10-CM | POA: Insufficient documentation

## 2018-01-22 DIAGNOSIS — N135 Crossing vessel and stricture of ureter without hydronephrosis: Secondary | ICD-10-CM | POA: Diagnosis not present

## 2018-01-22 DIAGNOSIS — Z886 Allergy status to analgesic agent status: Secondary | ICD-10-CM | POA: Diagnosis not present

## 2018-01-22 DIAGNOSIS — M545 Low back pain: Secondary | ICD-10-CM | POA: Diagnosis not present

## 2018-01-22 DIAGNOSIS — J449 Chronic obstructive pulmonary disease, unspecified: Secondary | ICD-10-CM | POA: Diagnosis not present

## 2018-01-22 DIAGNOSIS — Z885 Allergy status to narcotic agent status: Secondary | ICD-10-CM | POA: Insufficient documentation

## 2018-01-22 DIAGNOSIS — K811 Chronic cholecystitis: Secondary | ICD-10-CM | POA: Diagnosis not present

## 2018-01-22 DIAGNOSIS — Z79899 Other long term (current) drug therapy: Secondary | ICD-10-CM | POA: Diagnosis not present

## 2018-01-22 DIAGNOSIS — K219 Gastro-esophageal reflux disease without esophagitis: Secondary | ICD-10-CM | POA: Diagnosis not present

## 2018-01-22 HISTORY — PX: CHOLECYSTECTOMY: SHX55

## 2018-01-22 LAB — PROTIME-INR
INR: 0.97
Prothrombin Time: 12.8 seconds (ref 11.4–15.2)

## 2018-01-22 SURGERY — LAPAROSCOPIC CHOLECYSTECTOMY
Anesthesia: General

## 2018-01-22 MED ORDER — LACTATED RINGERS IV SOLN: INTRAVENOUS | Status: DC

## 2018-01-22 MED ORDER — PROPOFOL 10 MG/ML IV BOLUS
INTRAVENOUS | Status: DC | PRN
  Administered 2018-01-22: 150 mg via INTRAVENOUS

## 2018-01-22 MED ORDER — SUCCINYLCHOLINE CHLORIDE 200 MG/10ML IV SOSY
PREFILLED_SYRINGE | INTRAVENOUS | Status: AC
  Filled 2018-01-22: qty 10

## 2018-01-22 MED ORDER — PHENYLEPHRINE 40 MCG/ML (10ML) SYRINGE FOR IV PUSH (FOR BLOOD PRESSURE SUPPORT)
PREFILLED_SYRINGE | INTRAVENOUS | Status: AC
  Filled 2018-01-22: qty 10

## 2018-01-22 MED ORDER — DEXAMETHASONE SODIUM PHOSPHATE 10 MG/ML IJ SOLN
INTRAMUSCULAR | Status: DC | PRN
  Administered 2018-01-22: 4 mg via INTRAVENOUS

## 2018-01-22 MED ORDER — ONDANSETRON HCL 4 MG/2ML IJ SOLN
INTRAMUSCULAR | Status: DC | PRN
  Administered 2018-01-22: 4 mg via INTRAVENOUS

## 2018-01-22 MED ORDER — ONDANSETRON HCL 4 MG/2ML IJ SOLN
INTRAMUSCULAR | Status: AC
  Filled 2018-01-22: qty 2

## 2018-01-22 MED ORDER — CEFAZOLIN SODIUM-DEXTROSE 2-4 GM/100ML-% IV SOLN
2.0000 g | INTRAVENOUS | Status: AC
  Administered 2018-01-22: 2 g via INTRAVENOUS
  Filled 2018-01-22: qty 100

## 2018-01-22 MED ORDER — MIDAZOLAM HCL 2 MG/2ML IJ SOLN
INTRAMUSCULAR | Status: DC | PRN
  Administered 2018-01-22 (×2): 1 mg via INTRAVENOUS

## 2018-01-22 MED ORDER — FENTANYL CITRATE (PF) 250 MCG/5ML IJ SOLN
INTRAMUSCULAR | Status: DC | PRN
  Administered 2018-01-22 (×5): 50 ug via INTRAVENOUS

## 2018-01-22 MED ORDER — BUPIVACAINE-EPINEPHRINE 0.25% -1:200000 IJ SOLN
INTRAMUSCULAR | Status: DC | PRN
  Administered 2018-01-22: 30 mL

## 2018-01-22 MED ORDER — GABAPENTIN 300 MG PO CAPS
300.0000 mg | ORAL_CAPSULE | ORAL | Status: AC
  Administered 2018-01-22: 300 mg via ORAL
  Filled 2018-01-22: qty 1

## 2018-01-22 MED ORDER — MEPERIDINE HCL 50 MG/ML IJ SOLN: 6.2500 mg | INTRAMUSCULAR | Status: DC | PRN

## 2018-01-22 MED ORDER — OXYCODONE HCL 5 MG PO TABS
5.0000 mg | ORAL_TABLET | ORAL | Status: DC | PRN
  Administered 2018-01-22: 5 mg via ORAL

## 2018-01-22 MED ORDER — FENTANYL CITRATE (PF) 250 MCG/5ML IJ SOLN
INTRAMUSCULAR | Status: AC
  Filled 2018-01-22: qty 5

## 2018-01-22 MED ORDER — DEXAMETHASONE SODIUM PHOSPHATE 10 MG/ML IJ SOLN
INTRAMUSCULAR | Status: AC
  Filled 2018-01-22: qty 1

## 2018-01-22 MED ORDER — DIPHENHYDRAMINE HCL 50 MG/ML IJ SOLN
INTRAMUSCULAR | Status: AC
  Filled 2018-01-22: qty 1

## 2018-01-22 MED ORDER — LIDOCAINE 2% (20 MG/ML) 5 ML SYRINGE
INTRAMUSCULAR | Status: DC | PRN
  Administered 2018-01-22: 100 mg via INTRAVENOUS

## 2018-01-22 MED ORDER — FENTANYL CITRATE (PF) 100 MCG/2ML IJ SOLN: 25.0000 ug | INTRAMUSCULAR | Status: DC | PRN

## 2018-01-22 MED ORDER — LIDOCAINE 2% (20 MG/ML) 5 ML SYRINGE
INTRAMUSCULAR | Status: AC
  Filled 2018-01-22: qty 5

## 2018-01-22 MED ORDER — ACETAMINOPHEN 325 MG PO TABS: 650.0000 mg | ORAL_TABLET | ORAL | Status: DC | PRN

## 2018-01-22 MED ORDER — FENTANYL CITRATE (PF) 100 MCG/2ML IJ SOLN
25.0000 ug | INTRAMUSCULAR | Status: DC | PRN
  Administered 2018-01-22 (×2): 25 ug via INTRAVENOUS

## 2018-01-22 MED ORDER — SODIUM CHLORIDE 0.9% FLUSH: 3.0000 mL | Freq: Two times a day (BID) | INTRAVENOUS | Status: DC

## 2018-01-22 MED ORDER — LIDOCAINE 2% (20 MG/ML) 5 ML SYRINGE
INTRAMUSCULAR | Status: DC | PRN
  Administered 2018-01-22: 1.5 mg/kg/h via INTRAVENOUS

## 2018-01-22 MED ORDER — BUPIVACAINE LIPOSOME 1.3 % IJ SUSP
INTRAMUSCULAR | Status: DC | PRN
  Administered 2018-01-22: 20 mL

## 2018-01-22 MED ORDER — IOPAMIDOL (ISOVUE-300) INJECTION 61%
INTRAVENOUS | Status: AC
  Filled 2018-01-22: qty 50

## 2018-01-22 MED ORDER — TRAMADOL HCL 50 MG PO TABS: 50.0000 mg | ORAL_TABLET | Freq: Four times a day (QID) | ORAL | 0 refills | Status: DC | PRN

## 2018-01-22 MED ORDER — EPHEDRINE 5 MG/ML INJ
INTRAVENOUS | Status: AC
  Filled 2018-01-22: qty 10

## 2018-01-22 MED ORDER — 0.9 % SODIUM CHLORIDE (POUR BTL) OPTIME
TOPICAL | Status: DC | PRN
  Administered 2018-01-22: 1000 mL

## 2018-01-22 MED ORDER — ROCURONIUM BROMIDE 10 MG/ML (PF) SYRINGE
PREFILLED_SYRINGE | INTRAVENOUS | Status: DC | PRN
  Administered 2018-01-22: 70 mg via INTRAVENOUS

## 2018-01-22 MED ORDER — CHLORHEXIDINE GLUCONATE CLOTH 2 % EX PADS: 6.0000 | MEDICATED_PAD | Freq: Once | CUTANEOUS | Status: DC

## 2018-01-22 MED ORDER — SODIUM CHLORIDE 0.9 % IV SOLN: 250.0000 mL | INTRAVENOUS | Status: DC | PRN

## 2018-01-22 MED ORDER — BUPIVACAINE LIPOSOME 1.3 % IJ SUSP
20.0000 mL | Freq: Once | INTRAMUSCULAR | Status: DC
  Filled 2018-01-22: qty 20

## 2018-01-22 MED ORDER — ACETAMINOPHEN 650 MG RE SUPP
650.0000 mg | RECTAL | Status: DC | PRN
  Filled 2018-01-22: qty 1

## 2018-01-22 MED ORDER — MIDAZOLAM HCL 2 MG/2ML IJ SOLN
INTRAMUSCULAR | Status: AC
  Filled 2018-01-22: qty 2

## 2018-01-22 MED ORDER — SODIUM CHLORIDE 0.9% FLUSH: 3.0000 mL | INTRAVENOUS | Status: DC | PRN

## 2018-01-22 MED ORDER — PROPOFOL 10 MG/ML IV BOLUS
INTRAVENOUS | Status: AC
  Filled 2018-01-22: qty 20

## 2018-01-22 MED ORDER — OXYCODONE HCL 5 MG PO TABS
ORAL_TABLET | ORAL | Status: AC
  Filled 2018-01-22: qty 1

## 2018-01-22 MED ORDER — ACETAMINOPHEN 500 MG PO TABS
1000.0000 mg | ORAL_TABLET | ORAL | Status: AC
  Administered 2018-01-22: 1000 mg via ORAL
  Filled 2018-01-22: qty 2

## 2018-01-22 MED ORDER — SUGAMMADEX SODIUM 500 MG/5ML IV SOLN
INTRAVENOUS | Status: DC | PRN
  Administered 2018-01-22: 400 mg via INTRAVENOUS

## 2018-01-22 MED ORDER — BUPIVACAINE-EPINEPHRINE (PF) 0.25% -1:200000 IJ SOLN
INTRAMUSCULAR | Status: AC
  Filled 2018-01-22: qty 30

## 2018-01-22 MED ORDER — ROCURONIUM BROMIDE 100 MG/10ML IV SOLN
INTRAVENOUS | Status: AC
  Filled 2018-01-22: qty 1

## 2018-01-22 MED ORDER — LACTATED RINGERS IR SOLN
Status: DC | PRN
  Administered 2018-01-22: 1000 mL

## 2018-01-22 MED ORDER — DOCUSATE SODIUM 100 MG PO CAPS: 100.0000 mg | ORAL_CAPSULE | Freq: Two times a day (BID) | ORAL | 0 refills | Status: AC

## 2018-01-22 MED ORDER — LACTATED RINGERS IV SOLN
INTRAVENOUS | Status: DC
  Administered 2018-01-22: 08:00:00 via INTRAVENOUS

## 2018-01-22 MED ORDER — METOCLOPRAMIDE HCL 5 MG/ML IJ SOLN: 10.0000 mg | Freq: Once | INTRAMUSCULAR | Status: DC | PRN

## 2018-01-22 MED ORDER — SUGAMMADEX SODIUM 200 MG/2ML IV SOLN
INTRAVENOUS | Status: AC
  Filled 2018-01-22: qty 2

## 2018-01-22 MED ORDER — FENTANYL CITRATE (PF) 100 MCG/2ML IJ SOLN
INTRAMUSCULAR | Status: AC
  Administered 2018-01-22: 25 ug via INTRAVENOUS
  Filled 2018-01-22: qty 2

## 2018-01-22 SURGICAL SUPPLY — 41 items
ADH SKN CLS APL DERMABOND .7 (GAUZE/BANDAGES/DRESSINGS) ×1
APPLIER CLIP ROT 10 11.4 M/L (STAPLE) ×2
APR CLP MED LRG 11.4X10 (STAPLE) ×1
BAG SPEC RTRVL LRG 6X4 10 (ENDOMECHANICALS) ×1
CABLE HIGH FREQUENCY MONO STRZ (ELECTRODE) ×2 IMPLANT
CHLORAPREP W/TINT 26ML (MISCELLANEOUS) ×2 IMPLANT
CLIP APPLIE ROT 10 11.4 M/L (STAPLE) ×1 IMPLANT
COVER MAYO STAND STRL (DRAPES) IMPLANT
COVER SURGICAL LIGHT HANDLE (MISCELLANEOUS) ×2 IMPLANT
COVER WAND RF STERILE (DRAPES) ×1 IMPLANT
DERMABOND ADVANCED (GAUZE/BANDAGES/DRESSINGS) ×1
DERMABOND ADVANCED .7 DNX12 (GAUZE/BANDAGES/DRESSINGS) ×1 IMPLANT
DRAPE C-ARM 42X120 X-RAY (DRAPES) IMPLANT
ELECT REM PT RETURN 15FT ADLT (MISCELLANEOUS) ×2 IMPLANT
ENDOLOOP SUT PDS II  0 18 (SUTURE) ×2
ENDOLOOP SUT PDS II 0 18 (SUTURE) IMPLANT
GLOVE BIO SURGEON STRL SZ 6 (GLOVE) ×3 IMPLANT
GLOVE BIOGEL PI IND STRL 7.0 (GLOVE) IMPLANT
GLOVE BIOGEL PI INDICATOR 7.0 (GLOVE) ×2
GLOVE INDICATOR 6.5 STRL GRN (GLOVE) ×3 IMPLANT
GLOVE SURG SIGNA 7.5 PF LTX (GLOVE) ×1 IMPLANT
GOWN STRL REUS W/ TWL XL LVL3 (GOWN DISPOSABLE) IMPLANT
GOWN STRL REUS W/TWL LRG LVL3 (GOWN DISPOSABLE) ×4 IMPLANT
GOWN STRL REUS W/TWL XL LVL3 (GOWN DISPOSABLE) ×2
GRASPER SUT TROCAR 14GX15 (MISCELLANEOUS) ×2 IMPLANT
HEMOSTAT SNOW SURGICEL 2X4 (HEMOSTASIS) ×1 IMPLANT
KIT BASIN OR (CUSTOM PROCEDURE TRAY) ×2 IMPLANT
NDL INSUFFLATION 14GA 120MM (NEEDLE) ×1 IMPLANT
NEEDLE INSUFFLATION 14GA 120MM (NEEDLE) ×2 IMPLANT
POUCH SPECIMEN RETRIEVAL 10MM (ENDOMECHANICALS) ×2 IMPLANT
SCISSORS LAP 5X35 DISP (ENDOMECHANICALS) ×2 IMPLANT
SET CHOLANGIOGRAPH MIX (MISCELLANEOUS) IMPLANT
SET IRRIG TUBING LAPAROSCOPIC (IRRIGATION / IRRIGATOR) ×2 IMPLANT
SET TUBE SMOKE EVAC HIGH FLOW (TUBING) ×2 IMPLANT
SLEEVE XCEL OPT CAN 5 100 (ENDOMECHANICALS) ×5 IMPLANT
SUT MNCRL AB 4-0 PS2 18 (SUTURE) ×2 IMPLANT
TOWEL OR 17X26 10 PK STRL BLUE (TOWEL DISPOSABLE) ×2 IMPLANT
TOWEL OR NON WOVEN STRL DISP B (DISPOSABLE) IMPLANT
TRAY LAPAROSCOPIC (CUSTOM PROCEDURE TRAY) ×2 IMPLANT
TROCAR BLADELESS OPT 5 100 (ENDOMECHANICALS) ×2 IMPLANT
TROCAR XCEL 12X100 BLDLESS (ENDOMECHANICALS) ×2 IMPLANT

## 2018-01-22 NOTE — Transfer of Care (Signed)
Immediate Anesthesia Transfer of Care Note  Patient: Stacy Campbell  Procedure(s) Performed: [place] (N/A )  Patient Location: PACU  Anesthesia Type:General  Level of Consciousness: awake, drowsy and patient cooperative  Airway & Oxygen Therapy: Patient Spontanous Breathing and Patient connected to face mask oxygen  Post-op Assessment: Report given to RN and Post -op Vital signs reviewed and stable  Post vital signs: Reviewed and stable  Last Vitals:  Vitals Value Taken Time  BP 138/63 01/22/2018  9:45 AM  Temp    Pulse 87 01/22/2018  9:47 AM  Resp 21 01/22/2018  9:47 AM  SpO2 94 % 01/22/2018  9:47 AM  Vitals shown include unvalidated device data.  Last Pain:  Vitals:   01/22/18 0740  PainSc: 0-No pain         Complications: No apparent anesthesia complications

## 2018-01-22 NOTE — Op Note (Signed)
Operative Note  Stacy Campbell 63 y.o. female [id number]  01/22/2018  Surgeon: Clovis Riley MD  Assistant: Nedra Hai, MD  Procedure performed: Laparoscopic Cholecystectomy  Preop diagnosis: chronic cholecystitis, cholelithiasis, history of hepC cirrhosis and retroperitoneal fibrosis Post-op diagnosis/intraop findings: same  Specimens: gallbladder  EBL: minimal  Complications: none  Description of procedure: After obtaining informed consent the patient was brought to the operating room. Prophylactic antibiotics were administered. SCD's were applied. General endotracheal anesthesia was initiated and a formal time-out was performed. The abdomen was prepped and draped in the usual sterile fashion and the abdomen was entered using visiport technique in the left upper quadrant after instilling the site with local. Insufflation to 26mmHg was obtained and gross inspection revealed no evidence of injury from our entry. Three 48mm trocars were introduced in the supraumbilical, right midclavicular and right anterior axillary lines under direct visualization and following infiltration with local. An 63mm trocar was placed in the epigastrium. The gallbladder was white and fibrotic and contracted around large stones. The liver was enlarged and pale with a diffusely nodular surface. With difficulty the gallbladder was retracted cephalad and the infundibulum was retracted laterally. A combination of hook electrocautery and blunt dissection was utilized to clear the peritoneum from the neck and cystic duct, circumferentially isolating the cystic artery and cystic duct and lifting the gallbladder from the cystic plate. In this process a large stone (about 3 cm diameter) eroded through the anterior surface of the infundibulum of the gallbladder, avulsing this from the distal cystic duct/ neck. The artery was isolated and a clip applied proximally and distally and divided. Careful dissection was then  employed to lift the avulsed gallbladder infundibulum and neck from the liver plate and ensuring no other structures entering the gallbladder. The proximal gallbladder neck and cystic duct were then inspected, there was no efflux of bile from the lumen. This stump was ligated with two PDS Endoloops.  The gallbladder was dissected from the liver plate using electrocautery. Due to her fibrotic tissues the gallbladder essentially tore away from the liver bed quite easily. Hemostasis was achieved using cautery on the liver bed and a piece of Surgicel Emogene Morgan was placed.  Hemostasis was once again confirmed, and reinspection of the abdomen revealed no injuries. The arterial clips and the Endoloops on the cystic duct were well opposed without any bile leak from the duct or the liver bed. The gallbladder and the very large spilled stone were both placed in the Endo Catch bag and removed through the 11 mm trocar site which did have to be extended. The 33mm trocar site in the epigastrium was closed with a 0 vicryl in the fascia under direct visualization using a PMI device. The abdomen was desufflated and all trocars removed. The skin incisions were closed with running subcuticular monocryl and Dermabond. The patient was awakened, extubated and transported to the recovery room in stable condition.   All counts were correct at the completion of the case.

## 2018-01-22 NOTE — Anesthesia Procedure Notes (Signed)
Procedure Name: Intubation Date/Time: 01/22/2018 8:24 AM Performed by: Cynda Familia, CRNA Pre-anesthesia Checklist: Patient identified, Emergency Drugs available, Suction available and Patient being monitored Patient Re-evaluated:Patient Re-evaluated prior to induction Oxygen Delivery Method: Circle System Utilized Preoxygenation: Pre-oxygenation with 100% oxygen Induction Type: IV induction Ventilation: Mask ventilation without difficulty and Oral airway inserted - appropriate to patient size Laryngoscope Size: Sabra Heck and 2 Grade View: Grade I Tube type: Oral Number of attempts: 1 Airway Equipment and Method: Stylet Placement Confirmation: ETT inserted through vocal cords under direct vision,  positive ETCO2 and breath sounds checked- equal and bilateral Secured at: 21 cm Tube secured with: Tape Dental Injury: Teeth and Oropharynx as per pre-operative assessment  Comments: Smooth IV induction AM CRNA-- atraumatic-- no teeth preop-- oral AW removed after intubation-- mouth as preop-- bilat BS-- ETT adjusted by [employer]

## 2018-01-22 NOTE — Interval H&P Note (Signed)
History and Physical Interval Note:  01/22/2018 7:41 AM  Stacy Campbell  has presented today for surgery, with the diagnosis of Chronic cholecystitis  The various methods of treatment have been discussed with the patient and family. After consideration of risks, benefits and other options for treatment, the patient has consented to  Procedure(s): [name] (N/A) as a surgical intervention .  The patient's history has been reviewed, patient examined, no change in status, stable for surgery.  I have reviewed the patient's chart and labs.  Questions were answered to the patient's satisfaction.     [name] [name]

## 2018-01-22 NOTE — Discharge Instructions (Signed)
LAPAROSCOPIC SURGERY: POST OP INSTRUCTIONS ° °###################################################################### ° °EAT °Gradually transition to a high fiber diet with a fiber supplement over the next few weeks after discharge.  Start with a pureed / full liquid diet (see below) ° °WALK °Walk an hour a day.  Control your pain to do that.   ° °CONTROL PAIN °Control pain so that you can walk, sleep, tolerate sneezing/coughing, go up/down stairs. ° °HAVE A BOWEL MOVEMENT DAILY °Keep your bowels regular to avoid problems.  OK to try a laxative to override constipation.  OK to use an antidairrheal to slow down diarrhea.  Call if not better after 2 tries ° °CALL IF YOU HAVE PROBLEMS/CONCERNS °Call if you are still struggling despite following these instructions. °Call if you have concerns not answered by these instructions ° °###################################################################### ° ° ° °1. DIET: Follow a light bland diet the first 24 hours after arrival home, such as soup, liquids, crackers, etc.  Be sure to include lots of fluids daily.  Avoid fast food or heavy meals as your are more likely to get nauseated.  Eat a low fat the next few days after surgery.   ° °2. Take your usually prescribed home medications unless otherwise directed. ° °3. PAIN CONTROL: °a. Pain is best controlled by a usual combination of three different methods TOGETHER: °i. Ice/Heat °ii. Over the counter pain medication °iii. Prescription pain medication °b. Most patients will experience some swelling and bruising around the incisions.  Ice packs or heating pads (30-60 minutes up to 6 times a day) will help. Use ice for the first few days to help decrease swelling and bruising, then switch to heat to help relax tight/sore spots and speed recovery.  Some people prefer to use ice alone, heat alone, alternating between ice & heat.  Experiment to what works for you.  Swelling and bruising can take several weeks to resolve.   °c. It  is helpful to take an over-the-counter pain medication regularly for the first few weeks.  Choose one of the following that works best for you: °i. Naproxen (Aleve, etc)  Two 220mg tabs twice a day °ii. Ibuprofen (Advil, etc) Three 200mg tabs four times a day (every meal & bedtime) °iii. Acetaminophen (Tylenol, etc) 500-650mg four times a day (every meal & bedtime) °d. A  prescription for pain medication (such as oxycodone, hydrocodone, tramadol, gabapentin, methocarbamol, etc) should be given to you upon discharge.  Take your pain medication as prescribed.  °i. If you are having problems/concerns with the prescription medicine (does not control pain, nausea, vomiting, rash, itching, etc), please call us [phone] to see if we need to switch you to a different pain medicine that will work better for you and/or control your side effect better. °ii. If you need a refill on your pain medication, please give us 48 hour notice.  contact your pharmacy.  They will contact our office to request authorization. Prescriptions will not be filled after 5 pm or on week-ends ° °4. Avoid getting constipated.   °a. Between the surgery and the pain medications, it is common to experience some constipation.   °b. Increasing fluid intake and taking a fiber supplement (such as Metamucil, Citrucel, FiberCon, MiraLax, etc) 1-2 times a day regularly will usually help prevent this problem from occurring.   °c. A mild laxative (prune juice, Milk of Magnesia, MiraLax, etc) should be taken according to package directions if there are no bowel movements after 48 hours.   °5. Watch out for   diarrhea.   a. If you have many loose bowel movements, simplify your diet to bland foods & liquids for a few days.   b. Stop any stool softeners and decrease your fiber supplement.   c. Switching to mild anti-diarrheal medications (Kayopectate, Pepto Bismol) can help.   d. If this worsens or does not improve, please call us.  6. Wash / shower every  day.  You may shower over the skin glue which is waterproof.  No rubbing, scrubbing, lotions or ointments to incisions  7. Skin glue will flake off after 2 weeks.  You may leave the incision open to air.  You may replace a dressing/Band-Aid to cover the incision for comfort if you wish.   8. ACTIVITIES as tolerated:   a. You may resume regular (light) daily activities beginning the next day--such as daily self-care, walking, climbing stairs--gradually increasing activities as tolerated.  If you can walk 30 minutes without difficulty, it is safe to try more intense activity such as jogging, treadmill, bicycling, low-impact aerobics, swimming, etc. b. Save the most intensive and strenuous activity for last such as sit-ups, heavy lifting, contact sports, etc  Refrain from any heavy lifting or straining until you are off narcotics for pain control.   c. DO NOT PUSH THROUGH PAIN.  Let pain be your guide: If it hurts to do something, don't do it.  Pain is your body warning you to avoid that activity for another week until the pain goes down. d. You may drive when you are no longer taking prescription pain medication, you can comfortably wear a seatbelt, and you can safely maneuver your car and apply brakes. e. Dennis Bast may have sexual intercourse when it is comfortable.  9. FOLLOW UP in our office a. Please call [facility] at [phone] to set up an appointment to see your surgeon in the office for a follow-up appointment approximately 2-3 weeks after your surgery. b. Make sure that you call for this appointment the day you arrive home to insure a convenient appointment time.  10. IF YOU HAVE DISABILITY OR FAMILY LEAVE FORMS, BRING THEM TO THE OFFICE FOR PROCESSING.  DO NOT GIVE THEM TO YOUR DOCTOR.   WHEN TO CALL us [phone]: 1. Poor pain control 2. Reactions / problems with new medications (rash/itching, nausea, etc)  3. Fever over 101.5 F (38.5 C) 4. Inability to urinate 5. Nausea and/or  vomiting 6. Worsening swelling or bruising 7. Continued bleeding from incision. 8. Increased pain, redness, or drainage from the incision 9. Recurrent right upper abdominal pain or jaundice    The clinic staff is available to answer your questions during regular business hours (8:30am-5pm).  Please dont hesitate to call and ask to speak to one of our nurses for clinical concerns.   If you have a medical emergency, go to the nearest emergency room or call 911.  A surgeon from [facility] is always on call at the [facility], [place], [place], [place], [place]  [id number] ? MAIN: [phone] ? TOLL FREE: [phone] ?  FAX [phone] [email or web address]

## 2018-01-22 NOTE — Anesthesia Postprocedure Evaluation (Signed)
Anesthesia Post Note  Patient: [place]  Procedure(s) Performed: [place] (N/A )     Patient location during evaluation: PACU Anesthesia Type: General Level of consciousness: awake and alert Pain management: pain level controlled Vital Signs Assessment: post-procedure vital signs reviewed and stable Respiratory status: spontaneous breathing, nonlabored ventilation, respiratory function stable and patient connected to nasal cannula oxygen Cardiovascular status: blood pressure returned to baseline and stable Postop Assessment: no apparent nausea or vomiting Anesthetic complications: no    Last Vitals:  Vitals:   01/22/18 1115 01/22/18 1225  BP: (!) 162/96 (!) 162/91  Pulse: 73 73  Resp: 14 16  Temp: (!) 36.4 C   SpO2: 100% 98%    Last Pain:  Vitals:   01/22/18 1225  PainSc: 4                  Montez Hageman

## 2018-01-23 ENCOUNTER — Encounter (HOSPITAL_COMMUNITY): Payer: Self-pay | Admitting: Surgery

## 2018-01-30 DIAGNOSIS — M545 Low back pain: Secondary | ICD-10-CM | POA: Diagnosis not present

## 2018-01-30 DIAGNOSIS — Z79891 Long term (current) use of opiate analgesic: Secondary | ICD-10-CM | POA: Diagnosis not present

## 2018-01-30 DIAGNOSIS — G894 Chronic pain syndrome: Secondary | ICD-10-CM | POA: Diagnosis not present

## 2018-02-02 IMAGING — MR MR LUMBAR SPINE W/O CM
4 of 5 series · 25 of 48 positions shown · non-contrast
Comparison: Lumbar radiographs 04/15/2015.  No prior MRI.

CLINICAL DATA: Congenital spondylolysis, lumbosacral region

EXAM:
MRI LUMBAR SPINE WITHOUT CONTRAST
TECHNIQUE: Multiplanar, multisequence MR imaging of the lumbar spine was
performed. No intravenous contrast was administered.

[Series 3: T2 · sagittal · 4.0mm · 0.55mm/px · 6 of 13 slices shown (1 of 2)]
[im 1/13]
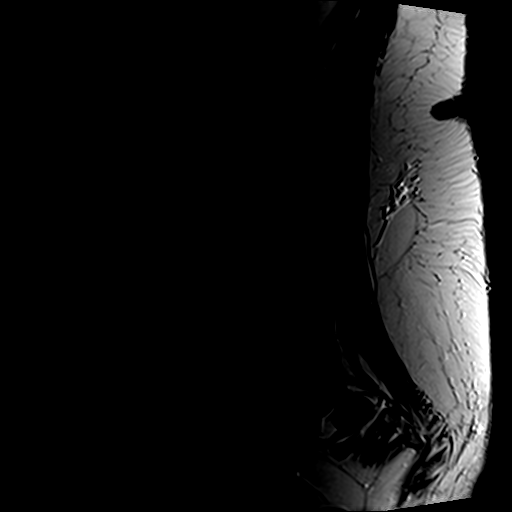
[im 3/13]
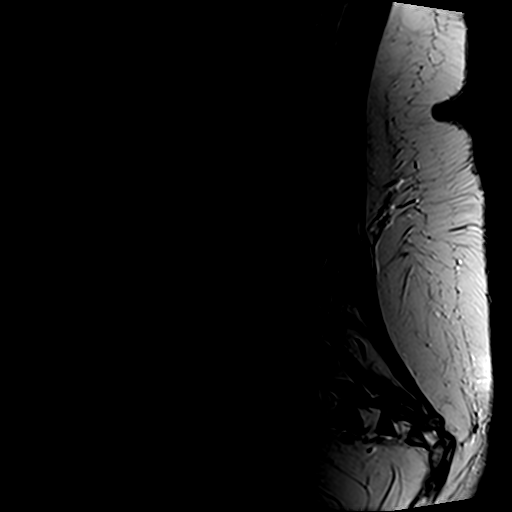
[im 5/13]
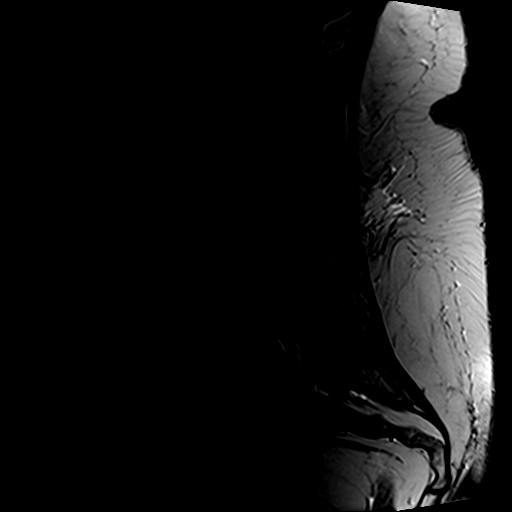
[im 8/13]
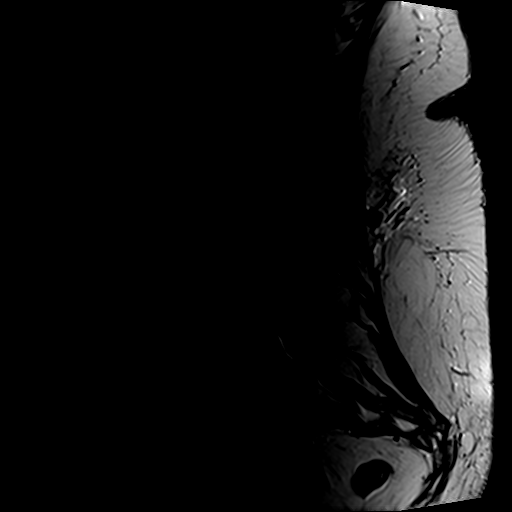
[im 10/13]
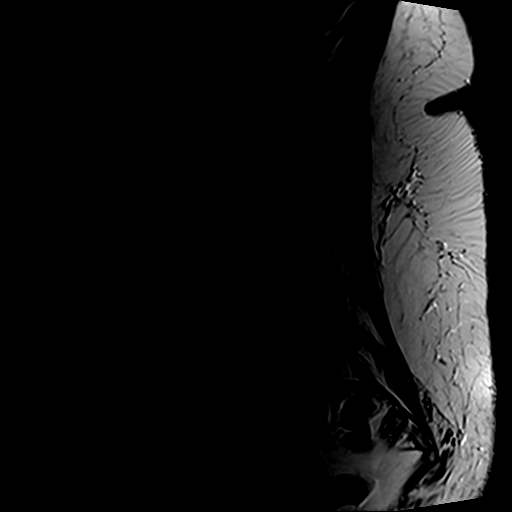
[im 13/13]
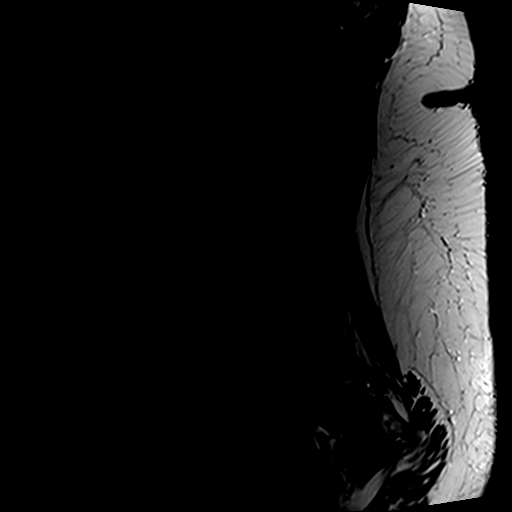

[Series 5: T1 · sagittal · 4.0mm · 0.55mm/px · 6 of 13 slices shown (1 of 2)]
[im 1/13]
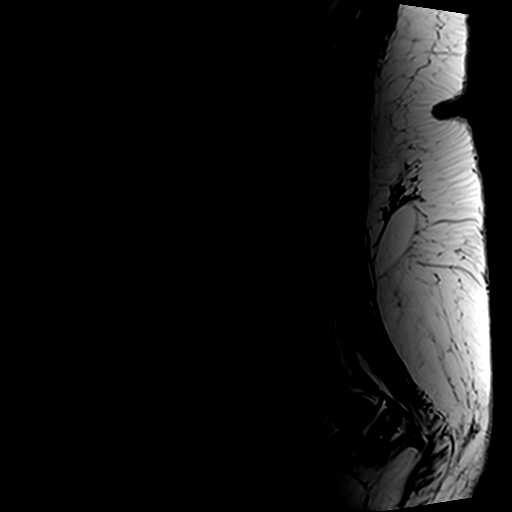
[im 3/13]
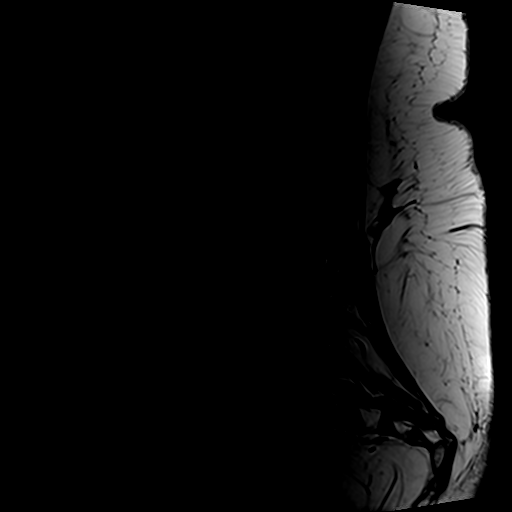
[im 5/13]
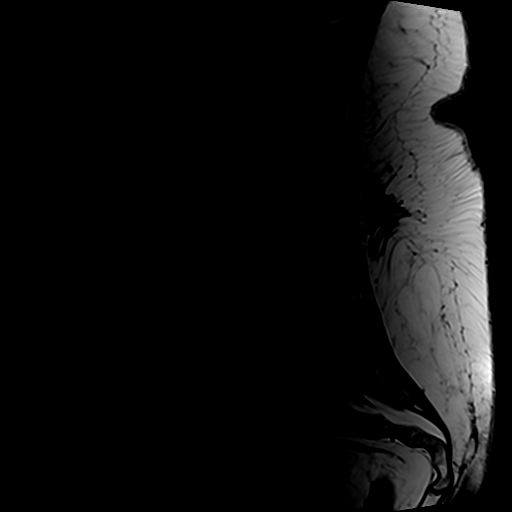
[im 8/13]
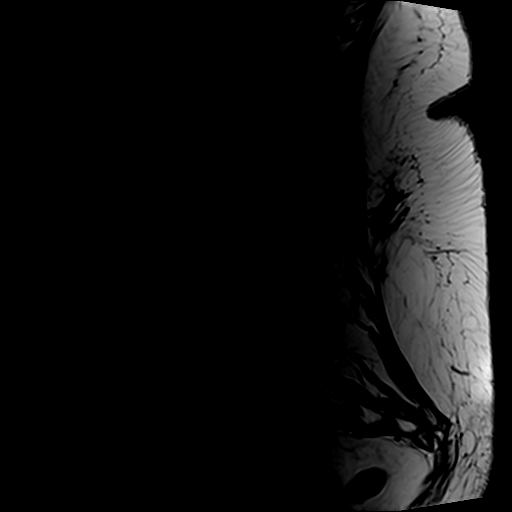
[im 10/13]
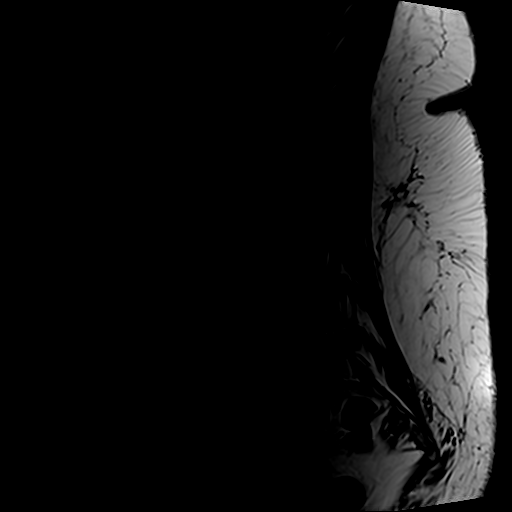
[im 13/13]
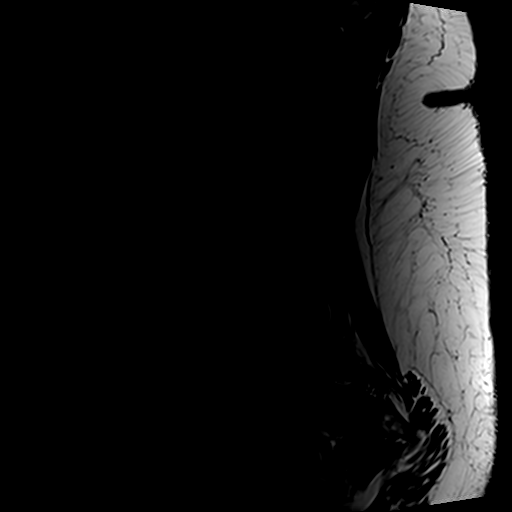

[Series 6: T2 · axial · 4.0mm · 0.78mm/px · z∈[-78,+104]mm · 9 of 33 slices shown (2 of 2)]
[im 1/33]
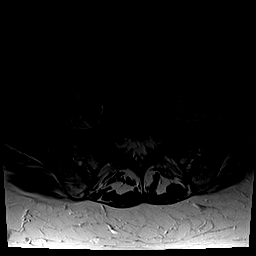
[im 5/33]
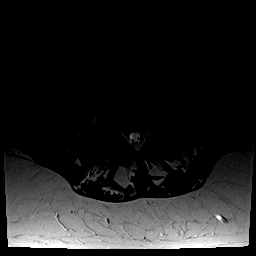
[im 10/33]
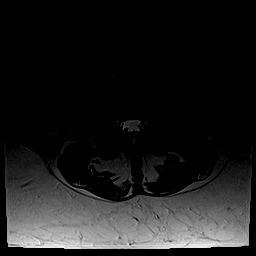
[im 14/33]
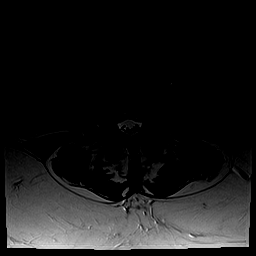
[im 17/33]
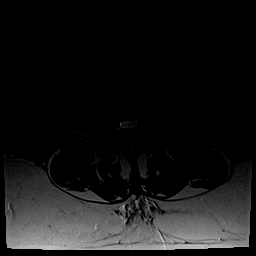
[im 19/33]
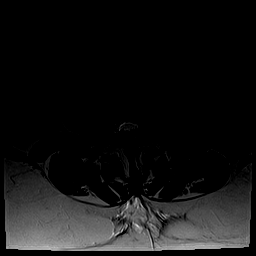
[im 23/33]
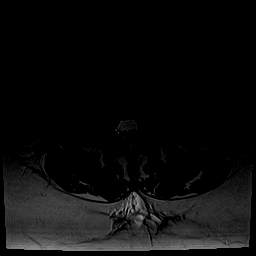
[im 28/33]
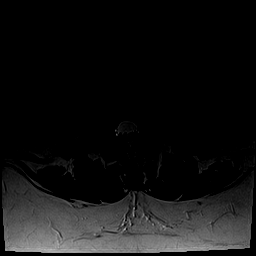
[im 33/33]
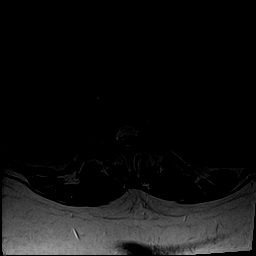

[Series 7: T1 · axial · 4.0mm · 0.39mm/px · z∈[-78,+79]mm · 4 of 33 slices shown (2 of 2)]
[im 1/33]
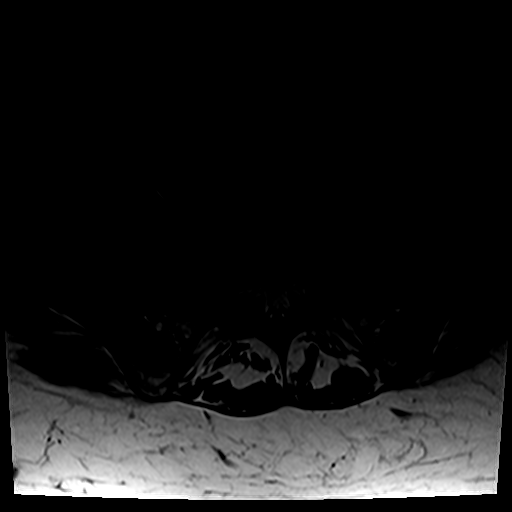
[im 5/33]
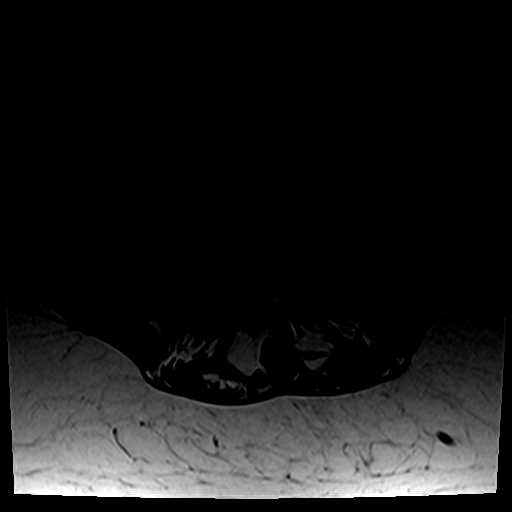
[im 17/33]
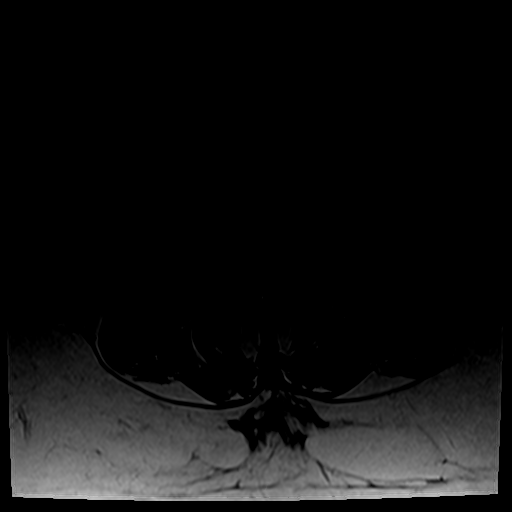
[im 28/33]
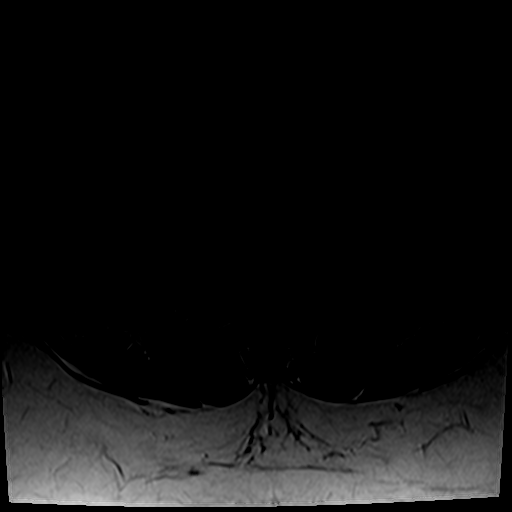

[25 of 48 positions shown; findings below may reference images not displayed]

FINDINGS: Segmentation:  Normal segmentation.  Lowest disc space L5-S1

Alignment:  Normal

Vertebrae: Negative for fracture or mass. Negative for pars defect.

Conus medullaris: Extends to the L1-2 level and appears normal.

Paraspinal and other soft tissues: Normal retroperitoneal
structures. Paraspinous muscles symmetric

Disc levels:

L1-2:  Negative

L2-3:  Negative

L3-4: Disc degeneration with disc space narrowing and diffuse
bulging of the disc. Mild facet degeneration. Mild spinal stenosis.
Neural foramina patent.

L4-5: Mild facet degeneration. No significant spinal or foraminal
stenosis

L5-S1: Small right-sided disc protrusion with displacement of the
right S1 nerve root. No definite nerve root compression. Bilateral
facet hypertrophy. No significant spinal or foraminal stenosis.
IMPRESSION: Negative for pars defect

Mild spinal stenosis at L3-4

Mild facet degeneration at L4-5

Small right-sided disc protrusion L5-S1 displacing the right S1
nerve root. Bilateral facet degeneration at L5-S1.

## 2018-02-10 DIAGNOSIS — L0101 Non-bullous impetigo: Secondary | ICD-10-CM | POA: Diagnosis not present

## 2018-02-10 DIAGNOSIS — L2089 Other atopic dermatitis: Secondary | ICD-10-CM | POA: Diagnosis not present

## 2018-02-12 ENCOUNTER — Encounter: Payer: Self-pay | Admitting: Allergy

## 2018-02-12 ENCOUNTER — Ambulatory Visit (INDEPENDENT_AMBULATORY_CARE_PROVIDER_SITE_OTHER): Payer: Medicare HMO | Admitting: Allergy

## 2018-02-12 VITALS — BP 134/88 | HR 91 | Temp 98.0°F | Resp 20

## 2018-02-12 DIAGNOSIS — J4530 Mild persistent asthma, uncomplicated: Secondary | ICD-10-CM

## 2018-02-12 DIAGNOSIS — T781XXD Other adverse food reactions, not elsewhere classified, subsequent encounter: Secondary | ICD-10-CM | POA: Diagnosis not present

## 2018-02-12 DIAGNOSIS — J3089 Other allergic rhinitis: Secondary | ICD-10-CM | POA: Diagnosis not present

## 2018-02-12 DIAGNOSIS — J302 Other seasonal allergic rhinitis: Secondary | ICD-10-CM

## 2018-02-12 DIAGNOSIS — R21 Rash and other nonspecific skin eruption: Secondary | ICD-10-CM

## 2018-02-12 DIAGNOSIS — L299 Pruritus, unspecified: Secondary | ICD-10-CM

## 2018-02-12 DIAGNOSIS — H1010 Acute atopic conjunctivitis, unspecified eye: Secondary | ICD-10-CM | POA: Diagnosis not present

## 2018-02-12 MED ORDER — EPINEPHRINE 0.3 MG/0.3ML IJ SOAJ: INTRAMUSCULAR | 2 refills | Status: DC

## 2018-02-12 NOTE — Assessment & Plan Note (Signed)
Past history - Diagnosed with asthma 50+ years ago. Uses albuterol once a week with good benefit.  Interim history - Much improved with Arnuity.  . Today's spirometry was normal.  Daily controller medication(s):continue Arnuity 100 1 puff once a day and rinse mouth afterwards.   Prior to physical activity:May use albuterol rescue inhaler 2 puffs 5 to 15 minutes prior to strenuous physical activities.  Rescue medications:May use albuterol rescue inhaler 2 puffs or nebulizer every 4 to 6 hours as needed for shortness of breath, chest tightness, coughing, and wheezing. Monitor frequency of use.

## 2018-02-12 NOTE — Progress Notes (Signed)
VIALS EXP 02-13-2019 

## 2018-02-12 NOTE — Assessment & Plan Note (Signed)
Past history - Eggs apparently cause pruritus and has been avoiding it. 2020 testing was negative to eggs. Interim history - No accidental ingestion.   Continue egg avoidance.  For mild symptoms you can take over the counter antihistamines such as Benadryl and monitor symptoms closely. If symptoms worsen or if you have severe symptoms including breathing issues, throat closure, significant swelling, whole body hives, severe diarrhea and vomiting, lightheadedness then seek immediate medical care.

## 2018-02-12 NOTE — Assessment & Plan Note (Signed)
Past history - Rhinoconjunctivitis symptoms for the past 50 years mainly in the spring and summer.  Patient has used Singulair and Claritin with some benefit. 2020 skin testing showed: Positive to grass, tree, dust mites, cockroaches, ragweed, weed, mold, cat, dog.  Interim history - stable.   Continue environmental control measures.  Start allergy injections. Discussed that this may or may not help with her pruritus/rash. It may take up to 6-12 months before noticing any clinical benefit.   Had a detailed discussion with patient/family that clinical history is suggestive of allergic rhinitis, and may benefit from allergy immunotherapy (AIT). Discussed in detail regarding the dosing, schedule, side effects (mild to moderate local allergic reaction and rarely systemic allergic reactions including anaphylaxis), and benefits (significant improvement in nasal symptoms, seasonal flares of asthma) of immunotherapy with the patient. There is significant time commitment involved with allergy shots, which includes weekly immunotherapy injections then biweekly to monthly injections for 3-5 years. Consent was signed.

## 2018-02-12 NOTE — Patient Instructions (Addendum)
Rash and other nonspecific skin eruption  Follow up with your dermatologist.  Continue zyrtec 10mg  in the morning and at night.  May use hydroxyzine 10mg  every 8 hours as needed for breakthrough itching.   Schedule for patch testing.   Get bloodwork - CMP, alpha gal  Mild persistent asthma without complication  Today's spirometry was normal.   Daily controller medication(s):continue arnuity 1 puff once a day and rinse mouth afterwards.   Prior to physical activity:May use albuterol rescue inhaler 2 puffs 5 to 15 minutes prior to strenuous physical activities.  Rescue medications:May use albuterol rescue inhaler 2 puffs or nebulizer every 4 to 6 hours as needed for shortness of breath, chest tightness, coughing, and wheezing. Monitor frequency of use.   Other allergic rhinitis  Skin testing in the past was positive to grass, tree, dust mites, cockroaches, ragweed, weed, mold, cat, dog.   Continue environmental control measures.  Start allergy injections.  Had a detailed discussion with patient/family that clinical history is suggestive of allergic rhinitis, and may benefit from allergy immunotherapy (AIT). Discussed in detail regarding the dosing, schedule, side effects (mild to moderate local allergic reaction and rarely systemic allergic reactions including anaphylaxis), and benefits (significant improvement in nasal symptoms, seasonal flares of asthma) of immunotherapy with the patient. There is significant time commitment involved with allergy shots, which includes weekly immunotherapy injections then biweekly to monthly injections for 3-5 years.   Adverse food reaction  Continue egg avoidance.  For mild symptoms you can take over the counter antihistamines such as Benadryl and monitor symptoms closely. If symptoms worsen or if you have severe symptoms including breathing issues, throat closure, significant swelling, whole body hives, severe diarrhea and vomiting,  lightheadedness then seek immediate medical care.  Follow up in 2 months

## 2018-02-12 NOTE — Progress Notes (Signed)
Follow Up Note  RE: Stacy Campbell MRN: [id number] DOB: 1955/11/19 Date of Office Visit: 02/12/2018  Referring provider: Sherene Sires, DO Primary care provider: Sherene Sires, DO  Chief Complaint: Asthma and Pruritis  History of Present Illness: I had the pleasure of seeing Stacy Campbell for a follow up visit at the [facility] on 02/12/2018. She is a 63 y.o. female, who is being followed for asthma, rash, allergic rhinitis, adverse food reaction. Today she is here for regular follow up visit. Her previous allergy office visit was on 01/15/2018 with Dr. Maudie Mercury.   Rash/pruritus Still itchy just like before and no improvement after cholecystectomy. Surgery went well.  Currently on zyrtec 10mg  BID with unknown benefit.   No recent dermatology visit.   Mild persistent asthma without complication Currently on Arnuity 1 puff once a day and rinsing mouth afterwards. Noticed improvement in her breathing. Denies any SOB, coughing, wheezing, chest tightness, nocturnal awakenings, ER/urgent care visits or prednisone use since the last visit.  Other allergic rhinitis Symptoms stable and would like to start on allergy injections.   Adverse food reaction Currently avoiding eggs. No accidental ingestion.   Assessment and Plan: [name] is a 63 y.o. female with: Rash and other nonspecific skin eruption Past history - Rash/pruritus since 2007 which occurs on her torso, legs and hands. Follows with dermatology. No previous skin biopsy or patch testing. Was on [place] in 2019 but stopped due to localized reaction at injection site. Up to date with age appropriate cancer screening exams. 2020 skin testing showed: Positive to grass, tree, dust mites, cockroaches, ragweed, weed, mold, cat, dog. Interim history - no improvement in rash/pruritus s/p cholecystectomy.  Given clinical history and work up not sure of exact etiology of this rash/pruritus.   Follow up with your  dermatologist.  Continue zyrtec 10mg  in the morning and at night.  May use hydroxyzine 10mg  every 8 hours as needed for breakthrough itching.   Schedule for patch testing in future.   Get bloodwork as below.  Pruritus See assessment and plan as above.  Seasonal and perennial allergic rhinoconjunctivitis Past history - Rhinoconjunctivitis symptoms for the past 50 years mainly in the spring and summer.  Patient has used Singulair and Claritin with some benefit. 2020 skin testing showed: Positive to grass, tree, dust mites, cockroaches, ragweed, weed, mold, cat, dog.  Interim history - stable.   Continue environmental control measures.  Start allergy injections. Discussed that this may or may not help with her pruritus/rash. It may take up to 6-12 months before noticing any clinical benefit.   Had a detailed discussion with patient/family that clinical history is suggestive of allergic rhinitis, and may benefit from allergy immunotherapy (AIT). Discussed in detail regarding the dosing, schedule, side effects (mild to moderate local allergic reaction and rarely systemic allergic reactions including anaphylaxis), and benefits (significant improvement in nasal symptoms, seasonal flares of asthma) of immunotherapy with the patient. There is significant time commitment involved with allergy shots, which includes weekly immunotherapy injections then biweekly to monthly injections for 3-5 years. Consent was signed.   Mild persistent asthma without complication Past history - Diagnosed with asthma 50+ years ago. Uses albuterol once a week with good benefit.  Interim history - Much improved with Arnuity.  . Today's spirometry was normal.  Daily controller medication(s):continue Arnuity 100 1 puff once a day and rinse mouth afterwards.   Prior to physical activity:May use albuterol rescue inhaler 2 puffs 5 to 15 minutes  prior to strenuous physical activities.  Rescue medications:May use  albuterol rescue inhaler 2 puffs or nebulizer every 4 to 6 hours as needed for shortness of breath, chest tightness, coughing, and wheezing. Monitor frequency of use.   Adverse food reaction Past history - Eggs apparently cause pruritus and has been avoiding it. 2020 testing was negative to eggs. Interim history - No accidental ingestion.   Continue egg avoidance.  For mild symptoms you can take over the counter antihistamines such as Benadryl and monitor symptoms closely. If symptoms worsen or if you have severe symptoms including breathing issues, throat closure, significant swelling, whole body hives, severe diarrhea and vomiting, lightheadedness then seek immediate medical care.  Return in about 2 months (around 04/13/2018).  Meds ordered this encounter  Medications  . EPINEPHrine 0.3 mg/0.3 mL IJ SOAJ injection    Sig: Use as directed for severe allergic reaction    Dispense:  2 Device    Refill:  2    Lab Orders     Comprehensive metabolic panel     Alpha-Gal Panel  Diagnostics: Spirometry:  Tracings reviewed. Her effort: Good reproducible efforts. FVC: 2.29L FEV1: 1.66L, 81% predicted FEV1/FVC ratio: 72% Interpretation: Spirometry consistent with normal pattern, improved from last one.  Please see scanned spirometry results for details.  Medication List:  Current Outpatient Medications  Medication Sig Dispense Refill  . albuterol (PROVENTIL HFA;VENTOLIN HFA) 108 (90 Base) MCG/ACT inhaler Inhale 2 puffs into the lungs every 6 (six) hours as needed for wheezing or shortness of breath. 1 Inhaler 2  . ALPRAZolam (XANAX) 1 MG tablet Take 0.5-1 tablets (0.5-1 mg total) by mouth at bedtime as needed for anxiety. Not a daily medicine, will be de-escalated over time 27 tablet 0  . ARIPiprazole (ABILIFY) 5 MG tablet TAKE 1 TABLET BY MOUTH DAILY (Patient taking differently: at bedtime. TAKE 1 TABLET BY MOUTH DAILY) 90 tablet 0  . Aspirin-Salicylamide-Caffeine (BC HEADACHE PO) Take 1  packet by mouth daily as needed (headaches).    . benzonatate (TESSALON) 100 MG capsule Take 1 capsule (100 mg total) by mouth 3 (three) times daily as needed for cough. 30 capsule 3  . carvedilol (COREG) 25 MG tablet Take 1 tablet (25 mg total) by mouth 2 (two) times daily with a meal. 180 tablet 3  . cephALEXin (KEFLEX) 500 MG capsule Take 500 mg by mouth 2 (two) times daily as needed (eczema flare).     . Colloidal Oatmeal (GOLD BOND ECZEMA RELIEF) 2 % CREA Apply 1 application topically daily as needed (eczema).    . diphenhydrAMINE (BENADRYL) 25 MG tablet Take 25 mg by mouth 2 (two) times daily as needed for itching.    . docusate sodium (COLACE) 100 MG capsule Take 1 capsule (100 mg total) by mouth 2 (two) times daily for 30 days. 60 capsule 0  . Fluticasone Furoate (ARNUITY ELLIPTA) 100 MCG/ACT AEPB Inhale 1 puff into the lungs daily. 1 each 5  . hydrochlorothiazide (HYDRODIURIL) 25 MG tablet Take 1 tablet (25 mg total) by mouth 2 (two) times daily. 90 tablet 3  . lisinopril (PRINIVIL,ZESTRIL) 10 MG tablet Take 1 tablet (10 mg total) by mouth daily. 90 tablet 3  . meclizine (ANTIVERT) 25 MG tablet Take 1 tablet (25 mg total) by mouth 3 (three) times daily as needed for dizziness. 30 tablet 1  . montelukast (SINGULAIR) 10 MG tablet Take 1 tablet (10 mg total) by mouth at bedtime. (Patient taking differently: Take 10 mg by mouth at bedtime  as needed (allergies). ) 90 tablet 6  . polyethylene glycol (MIRALAX / GLYCOLAX) packet Take 17 g by mouth 2 (two) times daily.    . promethazine (PHENERGAN) 25 MG tablet take 1 tablet by mouth every 8 hours if needed for nausea and vomiting 30 tablet 1  . sertraline (ZOLOFT) 100 MG tablet Take 1 tablet (100 mg total) by mouth daily. Target dose is 150 mg at night.  Per Dr. Tammi Klippel.  #45 with 11 refills. (Patient taking differently: Take 100 mg by mouth at bedtime. ) 90 tablet 3  . tiZANidine (ZANAFLEX) 4 MG tablet Take 4 mg by mouth 2 (two) times daily.    .  traMADol (ULTRAM) 50 MG tablet Take 1 tablet (50 mg total) by mouth every 6 (six) hours as needed. 20 tablet 0  . triamcinolone ointment (KENALOG) 0.1 % Apply 1 application topically 2 (two) times daily.    Marland Kitchen EPINEPHrine 0.3 mg/0.3 mL IJ SOAJ injection Use as directed for severe allergic reaction 2 Device 2   No current facility-administered medications for this visit.    Allergies: Allergies  Allergen Reactions  . Hydrocodone Itching  . Aspirin Itching and Other (See Comments)    Lower dose doesn't make her itch (she takes it every day)  . Eggs Or Egg-Derived Products Nausea Only and Other (See Comments)    No reaction if in another food   I reviewed her past medical history, social history, family history, and environmental history and no significant changes have been reported from previous visit on 01/15/2018.  Review of Systems  Constitutional: Negative for appetite change, chills, fever and unexpected weight change.  HENT: Negative for congestion and rhinorrhea.   Eyes: Negative for itching.  Respiratory: Negative for cough, chest tightness, shortness of breath and wheezing.   Cardiovascular: Negative for chest pain.  Gastrointestinal: Negative for abdominal pain.  Genitourinary: Negative for difficulty urinating.  Skin: Positive for rash.  Allergic/Immunologic: Positive for environmental allergies.  Neurological: Negative for headaches.   Objective: BP 134/88   Pulse 91   Temp 98 F (36.7 C)   Resp 20   SpO2 98%  There is no height or weight on file to calculate BMI. Physical Exam  Constitutional: She is oriented to person, place, and time. She appears well-developed and well-nourished.  HENT:  Head: Normocephalic and atraumatic.  Right Ear: External ear normal.  Left Ear: External ear normal.  Nose: Mucosal edema present.  Mouth/Throat: Oropharynx is clear and moist.  Eyes: Conjunctivae and EOM are normal.  Neck: Neck supple.  Cardiovascular: Normal rate, regular  rhythm and normal heart sounds. Exam reveals no gallop and no friction rub.  No murmur heard. Pulmonary/Chest: Effort normal and breath sounds normal. She has no wheezes. She has no rales.  Abdominal: Soft.  Lymphadenopathy:    She has no cervical adenopathy.  Neurological: She is alert and oriented to person, place, and time.  Skin: Skin is warm. Rash noted.  Hyperpigmented patches throughout the body. Few areas of flat erythematous rash on torso.  Psychiatric: She has a normal mood and affect. Her behavior is normal.  Nursing note and vitals reviewed.  Previous notes and tests were reviewed. The plan was reviewed with the patient/family, and all questions/concerned were addressed.  It was my pleasure to see Stacy Campbell today and participate in her care. Please feel free to contact me with any questions or concerns.  Sincerely,  Rexene Alberts, DO Allergy & Immunology  [facility]  [employer] office: [phone] [place] office: [phone]

## 2018-02-12 NOTE — Assessment & Plan Note (Signed)
Past history - Rash/pruritus since 2007 which occurs on her torso, legs and hands. Follows with dermatology. No previous skin biopsy or patch testing. Was on [place] in 2019 but stopped due to localized reaction at injection site. Up to date with age appropriate cancer screening exams. 2020 skin testing showed: Positive to grass, tree, dust mites, cockroaches, ragweed, weed, mold, cat, dog. Interim history - no improvement in rash/pruritus s/p cholecystectomy.  Given clinical history and work up not sure of exact etiology of this rash/pruritus.   Follow up with your dermatologist.  Continue zyrtec 10mg  in the morning and at night.  May use hydroxyzine 10mg  every 8 hours as needed for breakthrough itching.   Schedule for patch testing in future.   Get bloodwork as below.

## 2018-02-12 NOTE — Assessment & Plan Note (Signed)
.   See assessment and plan as above. 

## 2018-02-13 DIAGNOSIS — J301 Allergic rhinitis due to pollen: Secondary | ICD-10-CM | POA: Diagnosis not present

## 2018-02-14 DIAGNOSIS — J3089 Other allergic rhinitis: Secondary | ICD-10-CM

## 2018-02-18 LAB — COMPREHENSIVE METABOLIC PANEL
ALT: 42 IU/L — ABNORMAL HIGH (ref 0–32)
AST: 36 IU/L (ref 0–40)
Albumin/Globulin Ratio: 1.3 (ref 1.2–2.2)
Albumin: 4.3 g/dL (ref 3.8–4.8)
Alkaline Phosphatase: 97 IU/L (ref 39–117)
BUN/Creatinine Ratio: 12 (ref 12–28)
BUN: 11 mg/dL (ref 8–27)
Bilirubin Total: <0.2 mg/dL (ref 0.0–1.2)
CO2: 22 mmol/L (ref 20–29)
CREATININE: 0.94 mg/dL (ref 0.57–1.00)
Calcium: 9.4 mg/dL (ref 8.7–10.3)
Chloride: 106 mmol/L (ref 96–106)
GFR calc Af Amer: 75 mL/min/{1.73_m2} (ref >59)
GFR calc non Af Amer: 65 mL/min/{1.73_m2} (ref >59)
GLUCOSE: 101 mg/dL — AB (ref 65–99)
Globulin, Total: 3.2 g/dL (ref 1.5–4.5)
Potassium: 4.1 mmol/L (ref 3.5–5.2)
Sodium: 144 mmol/L (ref 134–144)
Total Protein: 7.5 g/dL (ref 6.0–8.5)

## 2018-02-18 LAB — ALPHA-GAL PANEL
Alpha Gal IgE*: <0.1 kU/L (ref <0.10)
Beef (Bos spp) IgE: 0.1 kU/L (ref <0.35)
Class Interpretation: 0
Class Interpretation: 0
Lamb/Mutton (Ovis spp) IgE: <0.1 kU/L (ref <0.35)
Pork (Sus spp) IgE: <0.1 kU/L (ref <0.35)

## 2018-02-19 ENCOUNTER — Encounter: Payer: Self-pay | Admitting: Allergy

## 2018-02-20 ENCOUNTER — Other Ambulatory Visit: Payer: Self-pay | Admitting: Gastroenterology

## 2018-02-20 ENCOUNTER — Other Ambulatory Visit: Payer: Self-pay | Admitting: Family Medicine

## 2018-02-20 DIAGNOSIS — Z1231 Encounter for screening mammogram for malignant neoplasm of breast: Secondary | ICD-10-CM

## 2018-02-26 ENCOUNTER — Ambulatory Visit: Payer: Medicare HMO

## 2018-02-27 DIAGNOSIS — Z79891 Long term (current) use of opiate analgesic: Secondary | ICD-10-CM | POA: Diagnosis not present

## 2018-02-27 DIAGNOSIS — M545 Low back pain: Secondary | ICD-10-CM | POA: Diagnosis not present

## 2018-02-27 DIAGNOSIS — G894 Chronic pain syndrome: Secondary | ICD-10-CM | POA: Diagnosis not present

## 2018-02-28 ENCOUNTER — Ambulatory Visit (INDEPENDENT_AMBULATORY_CARE_PROVIDER_SITE_OTHER): Payer: Medicare HMO | Admitting: *Deleted

## 2018-02-28 DIAGNOSIS — J309 Allergic rhinitis, unspecified: Secondary | ICD-10-CM

## 2018-02-28 NOTE — Progress Notes (Signed)
Immunotherapy   Patient Details  Name: Stacy Campbell MRN: [id number] Date of Birth: Sep 14, 1955  02/28/2018  [place] started injections for  Mold-Dmite-Cat-Dog-Cockroach & Grass-Ragweed-Weed-Tree. Following schedule: B  Frequency:2 times per week Epi-Pen:Epi-Pen Available  Consent signed and patient instructions given. No problems after 30 minutes in the office.   Horris Latino 02/28/2018, 4:21 PM

## 2018-03-04 ENCOUNTER — Encounter: Payer: Self-pay | Admitting: Internal Medicine

## 2018-03-04 ENCOUNTER — Ambulatory Visit (INDEPENDENT_AMBULATORY_CARE_PROVIDER_SITE_OTHER): Payer: Medicare HMO | Admitting: Internal Medicine

## 2018-03-04 VITALS — BP 148/89 | HR 80 | Temp 98.0°F | Ht 64.0 in | Wt 224.0 lb

## 2018-03-04 DIAGNOSIS — L301 Dyshidrosis [pompholyx]: Secondary | ICD-10-CM | POA: Diagnosis not present

## 2018-03-04 NOTE — Progress Notes (Signed)
[facility]      Reason for Consult: skin infection    Referring Physician: Dr. Elvera Lennox    Patient ID: Stacy Campbell, female    DOB: January 25, 1955, 63 y.o.   MRN: [id number]  HPI:   Here for evaluation of recurrent skin infections.   She has a long history of dermatitis with previous biologic treatments and has been followed recently by Dr. Elvera Lennox.  She describes lesions as scaly, dry areas.  Has had multiple patches around her body and very itchy.  She has not had any pus, no abscess, no surrounding erythema.  She tells me the swabs done for culture were over dry, scaly areas and no associated with purulence.  She does not recall any symptoms of superinfection of skin.  [facility] is going to [facility].   Previous record reviewed and a culture done with [facility], though no exam findings of purulence.  She has now completed a long course of cephalexin though she had not noted any improvement.  Lesions continue to wax and wane.    Past Medical History:  Diagnosis Date  . Angio-edema   . Anxiety   . Arthritis    "lower back" (01/29/2017)  . Asthma   . Bone spur    spine  . Chronic lower back pain   . Depression   . Dysrhythmia    Atrial fibrillation  . Eczema   . GERD (gastroesophageal reflux disease)   . Headache    "at least weekly" (01/29/2017)  . Hepatitis C    was treated for this  . History of atrial fibrillation without current medication    cardioversion in 1990s  . Hypertension   . Insomnia   . Urticaria     Prior to Admission medications   Medication Sig Start Date End Date Taking? Authorizing Provider  albuterol (PROVENTIL HFA;VENTOLIN HFA) 108 (90 Base) MCG/ACT inhaler Inhale 2 puffs into the lungs every 6 (six) hours as needed for wheezing or shortness of breath. 12/24/17  Yes Bland, Scott, DO  ARIPiprazole (ABILIFY) 5 MG tablet TAKE 1 TABLET BY MOUTH DAILY Patient taking differently: at bedtime. TAKE 1 TABLET BY MOUTH  DAILY 05/29/17  Yes Sherene Sires, DO  Aspirin-Salicylamide-Caffeine (BC HEADACHE PO) Take 1 packet by mouth daily as needed (headaches).   Yes [provider]  benzonatate (TESSALON) 100 MG capsule Take 1 capsule (100 mg total) by mouth 3 (three) times daily as needed for cough. 01/14/17  Yes Eloise Levels, MD  carvedilol (COREG) 25 MG tablet Take 1 tablet (25 mg total) by mouth 2 (two) times daily with a meal. 12/24/17  Yes Bland, Scott, DO  Colloidal Oatmeal (GOLD BOND ECZEMA RELIEF) 2 % CREA Apply 1 application topically daily as needed (eczema).   Yes [provider]  diphenhydrAMINE (BENADRYL) 25 MG tablet Take 25 mg by mouth 2 (two) times daily as needed for itching.   Yes [provider]  EPINEPHrine 0.3 mg/0.3 mL IJ SOAJ injection Use as directed for severe allergic reaction 02/12/18  Yes Garnet Sierras, DO  Fluticasone Furoate (ARNUITY ELLIPTA) 100 MCG/ACT AEPB Inhale 1 puff into the lungs daily. 01/15/18  Yes Garnet Sierras, DO  hydrochlorothiazide (HYDRODIURIL) 25 MG tablet Take 1 tablet (25 mg total) by mouth 2 (two) times daily. 04/08/17  Yes Sherene Sires, DO  hydrOXYzine (ATARAX/VISTARIL) 25 MG tablet Take 25 mg by mouth 3 (three) times daily. 02/10/18  Yes [provider]  lisinopril (PRINIVIL,ZESTRIL) 10 MG tablet Take 1 tablet (10 mg total) by mouth daily. 12/24/17  Yes Sherene Sires, DO  meclizine (ANTIVERT) 25 MG tablet Take 1 tablet (25 mg total) by mouth 3 (three) times daily as needed for dizziness. 12/24/17  Yes Bland, Scott, DO  montelukast (SINGULAIR) 10 MG tablet Take 1 tablet (10 mg total) by mouth at bedtime. Patient taking differently: Take 10 mg by mouth at bedtime as needed (allergies).  05/16/15  Yes Rumley, [place] N, DO  mupirocin ointment (BACTROBAN) 2 % Apply 1 application topically as needed. 02/14/18  Yes [provider]  polyethylene glycol (MIRALAX / GLYCOLAX) packet Take 17 g by mouth 2 (two) times daily.   Yes [provider]   promethazine (PHENERGAN) 25 MG tablet take 1 tablet by mouth every 8 hours if needed for nausea and vomiting 12/24/17  Yes Sherene Sires, DO  sertraline (ZOLOFT) 100 MG tablet Take 1 tablet (100 mg total) by mouth daily. Target dose is 150 mg at night.  Per Dr. Tammi Klippel.  #45 with 11 refills. Patient taking differently: Take 100 mg by mouth at bedtime.  09/02/17 08/28/18 Yes Bland, Scott, DO  tiZANidine (ZANAFLEX) 4 MG tablet Take 4 mg by mouth 2 (two) times daily.   Yes [provider]  traMADol (ULTRAM) 50 MG tablet Take 1 tablet (50 mg total) by mouth every 6 (six) hours as needed. 01/22/18 01/22/19 Yes Clovis Riley, MD  triamcinolone ointment (KENALOG) 0.1 % Apply 1 application topically 2 (two) times daily.   Yes [provider]  ALPRAZolam Duanne Moron) 1 MG tablet Take 0.5-1 tablets (0.5-1 mg total) by mouth at bedtime as needed for anxiety. Not a daily medicine, will be de-escalated over time 01/13/18 02/12/18  Sherene Sires, DO  cephALEXin (KEFLEX) 500 MG capsule Take 500 mg by mouth 2 (two) times daily as needed (eczema flare).     [provider]  predniSONE (DELTASONE) 20 MG tablet Take 1 tablet by mouth daily. 02/23/18   [provider]    Allergies  Allergen Reactions  . Hydrocodone Itching  . Aspirin Itching and Other (See Comments)    Lower dose doesn't make her itch (she takes it every day)  . Eggs Or Egg-Derived Products Nausea Only and Other (See Comments)    No reaction if in another food    Social History   Tobacco Use  . Smoking status: Former Smoker    Packs/day: 0.25    Years: 37.00    Pack years: 9.25    Types: Cigarettes    Last attempt to quit: 03/30/2008    Years since quitting: 9.9  . Smokeless tobacco: Never Used  Substance Use Topics  . Alcohol use: No  . Drug use: No    Family History  Problem Relation Age of Onset  . Asthma Mother   . Diabetes Mother   . Asthma Father   . Diabetes Father   . Cancer Father         prostate  . Eczema Father   . Hepatitis B Daughter   . Breast cancer Neg Hx     Review of Systems  Constitutional: negative for fevers, chills and malaise Gastrointestinal: negative for nausea and diarrhea Musculoskeletal: negative for myalgias and arthralgias All other systems reviewed and are negative    Constitutional: in no apparent distress  Vitals:   03/04/18 0850  BP: (!) 148/89  Pulse: 80  Temp: 98 F (36.7 C)   EYES: anicteric ENMT: no  thrush Cardiovascular: Cor RRR Respiratory: CTA B; normal respiratory effort Musculoskeletal: no pedal edema noted Skin: multiple areas of small, typically circular areas of dry, scaly patches Hematologic: no cervical lad Neuro: non-focal  Labs: Lab Results  Component Value Date   WBC 7.8 01/16/2018   HGB 14.8 01/16/2018   HCT 48.5 (H) 01/16/2018   MCV 78.2 (L) 01/16/2018   PLT 281 01/16/2018    Lab Results  Component Value Date   CREATININE 0.94 02/12/2018   BUN 11 02/12/2018   NA 144 02/12/2018   K 4.1 02/12/2018   CL 106 02/12/2018   CO2 22 02/12/2018    Lab Results  Component Value Date   ALT 42 (H) 02/12/2018   AST 36 02/12/2018   ALKPHOS 97 02/12/2018   BILITOT <0.2 02/12/2018   INR 0.97 01/22/2018     Assessment: bacterial colonization, dermatitis.  From the history above, review of the records, I do no elicit any concerns for recent or ongoing infectious process.  Her lesions have been dry, there have been no concerns for purulence, cellulitis or other concerns.  From an infectious disease perspective, there is no indication to swab dermatitis and treat with antibiotics without purulence.   Certainly, she is at risk for superinfection over dermatitis, but by the history, this has not been the case in the past from her recollection.  Additionally, if this does occur, 3-5 days of an antibiotic would be sufficient for treatment.    Plan: 1) avoid antibiotics without clear indication Thank you for the referral

## 2018-03-11 ENCOUNTER — Ambulatory Visit (INDEPENDENT_AMBULATORY_CARE_PROVIDER_SITE_OTHER): Payer: Medicare HMO | Admitting: *Deleted

## 2018-03-11 DIAGNOSIS — J309 Allergic rhinitis, unspecified: Secondary | ICD-10-CM | POA: Diagnosis not present

## 2018-03-18 ENCOUNTER — Ambulatory Visit (INDEPENDENT_AMBULATORY_CARE_PROVIDER_SITE_OTHER): Payer: Medicare HMO | Admitting: *Deleted

## 2018-03-18 ENCOUNTER — Other Ambulatory Visit: Payer: Self-pay | Admitting: Family Medicine

## 2018-03-18 DIAGNOSIS — J309 Allergic rhinitis, unspecified: Secondary | ICD-10-CM

## 2018-03-18 DIAGNOSIS — F411 Generalized anxiety disorder: Secondary | ICD-10-CM

## 2018-03-25 ENCOUNTER — Other Ambulatory Visit: Payer: Self-pay

## 2018-03-25 ENCOUNTER — Ambulatory Visit (INDEPENDENT_AMBULATORY_CARE_PROVIDER_SITE_OTHER): Payer: Medicare HMO | Admitting: *Deleted

## 2018-03-25 DIAGNOSIS — J309 Allergic rhinitis, unspecified: Secondary | ICD-10-CM | POA: Diagnosis not present

## 2018-03-27 ENCOUNTER — Ambulatory Visit: Payer: Medicare HMO

## 2018-03-27 DIAGNOSIS — M545 Low back pain: Secondary | ICD-10-CM | POA: Diagnosis not present

## 2018-03-27 DIAGNOSIS — Z79891 Long term (current) use of opiate analgesic: Secondary | ICD-10-CM | POA: Diagnosis not present

## 2018-03-27 DIAGNOSIS — M25559 Pain in unspecified hip: Secondary | ICD-10-CM | POA: Diagnosis not present

## 2018-03-27 DIAGNOSIS — G894 Chronic pain syndrome: Secondary | ICD-10-CM | POA: Diagnosis not present

## 2018-04-01 ENCOUNTER — Ambulatory Visit (INDEPENDENT_AMBULATORY_CARE_PROVIDER_SITE_OTHER): Payer: Medicare HMO | Admitting: *Deleted

## 2018-04-01 DIAGNOSIS — J309 Allergic rhinitis, unspecified: Secondary | ICD-10-CM | POA: Diagnosis not present

## 2018-04-07 ENCOUNTER — Ambulatory Visit (INDEPENDENT_AMBULATORY_CARE_PROVIDER_SITE_OTHER): Payer: Medicare HMO

## 2018-04-07 DIAGNOSIS — J309 Allergic rhinitis, unspecified: Secondary | ICD-10-CM | POA: Diagnosis not present

## 2018-04-11 ENCOUNTER — Ambulatory Visit: Payer: Medicare HMO

## 2018-04-15 ENCOUNTER — Ambulatory Visit (INDEPENDENT_AMBULATORY_CARE_PROVIDER_SITE_OTHER): Payer: Medicare HMO | Admitting: *Deleted

## 2018-04-15 DIAGNOSIS — J309 Allergic rhinitis, unspecified: Secondary | ICD-10-CM

## 2018-04-22 ENCOUNTER — Ambulatory Visit (INDEPENDENT_AMBULATORY_CARE_PROVIDER_SITE_OTHER): Payer: Medicare HMO

## 2018-04-22 DIAGNOSIS — J309 Allergic rhinitis, unspecified: Secondary | ICD-10-CM | POA: Diagnosis not present

## 2018-04-28 DIAGNOSIS — N131 Hydronephrosis with ureteral stricture, not elsewhere classified: Secondary | ICD-10-CM | POA: Diagnosis not present

## 2018-04-29 ENCOUNTER — Ambulatory Visit (INDEPENDENT_AMBULATORY_CARE_PROVIDER_SITE_OTHER): Payer: Medicare HMO | Admitting: *Deleted

## 2018-04-29 DIAGNOSIS — J309 Allergic rhinitis, unspecified: Secondary | ICD-10-CM | POA: Diagnosis not present

## 2018-04-30 DIAGNOSIS — Z79891 Long term (current) use of opiate analgesic: Secondary | ICD-10-CM | POA: Diagnosis not present

## 2018-04-30 DIAGNOSIS — G894 Chronic pain syndrome: Secondary | ICD-10-CM | POA: Diagnosis not present

## 2018-04-30 DIAGNOSIS — M545 Low back pain: Secondary | ICD-10-CM | POA: Diagnosis not present

## 2018-05-06 ENCOUNTER — Telehealth (INDEPENDENT_AMBULATORY_CARE_PROVIDER_SITE_OTHER): Payer: Medicare HMO | Admitting: Family Medicine

## 2018-05-06 ENCOUNTER — Ambulatory Visit (INDEPENDENT_AMBULATORY_CARE_PROVIDER_SITE_OTHER): Payer: Medicare HMO | Admitting: *Deleted

## 2018-05-06 ENCOUNTER — Other Ambulatory Visit: Payer: Self-pay

## 2018-05-06 DIAGNOSIS — J309 Allergic rhinitis, unspecified: Secondary | ICD-10-CM

## 2018-05-06 DIAGNOSIS — F411 Generalized anxiety disorder: Secondary | ICD-10-CM

## 2018-05-06 DIAGNOSIS — F5101 Primary insomnia: Secondary | ICD-10-CM | POA: Diagnosis not present

## 2018-05-06 MED ORDER — RAMELTEON 8 MG PO TABS: 8.0000 mg | ORAL_TABLET | Freq: Every day | ORAL | Status: DC

## 2018-05-06 NOTE — Progress Notes (Signed)
[facility] Telemedicine Visit  Patient consented to have virtual visit. Method of visit: Video was attempted, but technology challenges prevented patient from using video, so visit was conducted via telephone.  Encounter participants: Patient: Stacy Campbell - located at home Provider: Benay Pike - located at [facility] Others (if applicable): none  Chief Complaint: insomnia, anxiety  HPI: Patient complains that she has not been sleeping well since her Ambien was stopped.  She states she goes to bed around 10 PM and usually wakes up at 10:30 PM and cannot go back to sleep after she wakes up.  She says she does not sleep throughout the day.  She says she does not lay in bed unless to sleep, does not watch TV, does not have any sort of distraction when she sleeps.  She says the melatonin does not help her at all.  She says the last time she was taking Ambien she was only taking it as needed and not every night.  Patient is having to watch her grandchildren throughout the day and help homeschool them during coronavirus outbreak.  She says she is "exhausted all the time".  Patient says she does not believe the Xanax is working as well as it was when she was on a higher dose.  She says instead of taking half a pill which would be 0.5 mg she takes the whole pill which was her previous dose.  Despite this, she says her last refill was March 10, over 1.5 months ago.  She says she has 2 Xanax is left.  She has been trying to set up a therapy appointment with Dr. Gwenlyn Saran at [facility].   ROS: per HPI  Pertinent PMHx: Anxiety, depression, insomnia  Exam:  Respiratory: Patient speaking in full sentences without difficulty.  No coughing.  Assessment/Plan:  Insomnia Patient states she has difficulty sleeping since her Ambien was stopped.  She says she gets roughly 30 minutes of sleep before waking up and being unable to fall back asleep.  Patient is currently on  Xanax as well as tramadol, and Ambien was discontinued by PCP due to concerns for side effects.  Discussed with PCP and he agrees to try patient on ramelteon, and says she should not go back on Ambien. - 15 days of ramelteon 8 mg at night. - Do not represcribe Ambien or other non-benzos.  Anxiety state Patient states her anxiety is getting worse, although she has not had a refill of her Xanax in approximately 6 weeks of a 30-day supply.  States she only uses it as as needed.  She will often take the full tab instead of the half a tab like was discussed during her last PCP encounter.  Told patient when she needs to get refill for her Xanax she will need to discuss this with her PCP as he is trying to taper her off of it currently.    Time spent during visit with patient: 15 minutes

## 2018-05-06 NOTE — Assessment & Plan Note (Signed)
Patient states her anxiety is getting worse, although she has not had a refill of her Xanax in approximately 6 weeks of a 30-day supply.  States she only uses it as as needed.  She will often take the full tab instead of the half a tab like was discussed during her last PCP encounter.  Told patient when she needs to get refill for her Xanax she will need to discuss this with her PCP as he is trying to taper her off of it currently.

## 2018-05-06 NOTE — Assessment & Plan Note (Signed)
Patient states she has difficulty sleeping since her Ambien was stopped.  She says she gets roughly 30 minutes of sleep before waking up and being unable to fall back asleep.  Patient is currently on Xanax as well as tramadol, and Ambien was discontinued by PCP due to concerns for side effects.  Discussed with PCP and he agrees to try patient on ramelteon, and says she should not go back on Ambien. - 15 days of ramelteon 8 mg at night. - Do not represcribe Ambien or other non-benzos.

## 2018-05-07 ENCOUNTER — Other Ambulatory Visit: Payer: Self-pay | Admitting: Family Medicine

## 2018-05-07 ENCOUNTER — Telehealth: Payer: Self-pay | Admitting: *Deleted

## 2018-05-07 DIAGNOSIS — F411 Generalized anxiety disorder: Secondary | ICD-10-CM

## 2018-05-07 MED ORDER — RAMELTEON 8 MG PO TABS: 8.0000 mg | ORAL_TABLET | Freq: Every day | ORAL | 0 refills | Status: DC

## 2018-05-07 NOTE — Telephone Encounter (Signed)
Pt calls to check the status of medication for appt yesterday.    It looks like the medication was put into Epic as a clinec administered med.  Will send to MD to send electronically. Christen Bame, CMA

## 2018-05-08 ENCOUNTER — Telehealth: Payer: Self-pay

## 2018-05-08 NOTE — Telephone Encounter (Signed)
Need to try prior auth.  We are specifically trying to avoid benzos and nonbenzos like the ones on the preferred list.

## 2018-05-08 NOTE — Telephone Encounter (Signed)
Pt calls nurse line stating [facility] is requiring a prior authorization.   Preferred options listed below. Please change to a preferred medication. Or let me know if you want to pursue a PA.

## 2018-05-09 NOTE — Telephone Encounter (Signed)
Received fax from [facility] requesting prior authorization of [place] .Clinical questions placed in MD's box for completion.

## 2018-05-13 ENCOUNTER — Ambulatory Visit (INDEPENDENT_AMBULATORY_CARE_PROVIDER_SITE_OTHER): Payer: Medicare HMO

## 2018-05-13 DIAGNOSIS — J309 Allergic rhinitis, unspecified: Secondary | ICD-10-CM

## 2018-05-16 NOTE — Telephone Encounter (Signed)
Pt calling to check status, I do not see the form in providers box.  Christen Bame, CMA

## 2018-05-17 NOTE — Telephone Encounter (Signed)
I already filled this out and put it in the 'to be faxed' area.

## 2018-05-19 ENCOUNTER — Ambulatory Visit (INDEPENDENT_AMBULATORY_CARE_PROVIDER_SITE_OTHER): Payer: Medicare HMO

## 2018-05-19 DIAGNOSIS — J309 Allergic rhinitis, unspecified: Secondary | ICD-10-CM

## 2018-05-22 NOTE — Telephone Encounter (Signed)
Contacted pharmacy to ask if PA was being requested on Medicaid or medicare to verify which avenue to take.  Stacy Campbell ran medication thru [facility] and received a paid claim (will cost pt $1.38).  Not sure if insurance received the PA and processed or if it was not originally ran thru [employer], only medicaid.   Regardless pt informed and grateful for call, no further action needed. Christen Bame, CMA

## 2018-05-28 DIAGNOSIS — Z79891 Long term (current) use of opiate analgesic: Secondary | ICD-10-CM | POA: Diagnosis not present

## 2018-05-28 DIAGNOSIS — G894 Chronic pain syndrome: Secondary | ICD-10-CM | POA: Diagnosis not present

## 2018-05-28 DIAGNOSIS — M545 Low back pain: Secondary | ICD-10-CM | POA: Diagnosis not present

## 2018-06-06 ENCOUNTER — Other Ambulatory Visit: Payer: Self-pay | Admitting: Family Medicine

## 2018-06-11 ENCOUNTER — Other Ambulatory Visit: Payer: Self-pay | Admitting: Family Medicine

## 2018-06-11 DIAGNOSIS — F411 Generalized anxiety disorder: Secondary | ICD-10-CM

## 2018-06-16 DIAGNOSIS — L308 Other specified dermatitis: Secondary | ICD-10-CM | POA: Diagnosis not present

## 2018-06-16 DIAGNOSIS — L83 Acanthosis nigricans: Secondary | ICD-10-CM | POA: Diagnosis not present

## 2018-06-25 DIAGNOSIS — M545 Low back pain: Secondary | ICD-10-CM | POA: Diagnosis not present

## 2018-06-25 DIAGNOSIS — Z79891 Long term (current) use of opiate analgesic: Secondary | ICD-10-CM | POA: Diagnosis not present

## 2018-06-25 DIAGNOSIS — G894 Chronic pain syndrome: Secondary | ICD-10-CM | POA: Diagnosis not present

## 2018-07-10 ENCOUNTER — Other Ambulatory Visit: Payer: Self-pay | Admitting: Family Medicine

## 2018-07-10 DIAGNOSIS — F411 Generalized anxiety disorder: Secondary | ICD-10-CM

## 2018-07-14 ENCOUNTER — Other Ambulatory Visit: Payer: Self-pay | Admitting: Gastroenterology

## 2018-07-14 DIAGNOSIS — R110 Nausea: Secondary | ICD-10-CM | POA: Diagnosis not present

## 2018-07-14 DIAGNOSIS — Z1211 Encounter for screening for malignant neoplasm of colon: Secondary | ICD-10-CM | POA: Diagnosis not present

## 2018-07-14 DIAGNOSIS — K7460 Unspecified cirrhosis of liver: Secondary | ICD-10-CM

## 2018-07-14 DIAGNOSIS — B1920 Unspecified viral hepatitis C without hepatic coma: Secondary | ICD-10-CM | POA: Diagnosis not present

## 2018-07-22 DIAGNOSIS — L308 Other specified dermatitis: Secondary | ICD-10-CM | POA: Diagnosis not present

## 2018-07-22 DIAGNOSIS — L299 Pruritus, unspecified: Secondary | ICD-10-CM | POA: Diagnosis not present

## 2018-07-23 DIAGNOSIS — M545 Low back pain: Secondary | ICD-10-CM | POA: Diagnosis not present

## 2018-07-23 DIAGNOSIS — G894 Chronic pain syndrome: Secondary | ICD-10-CM | POA: Diagnosis not present

## 2018-07-23 DIAGNOSIS — H524 Presbyopia: Secondary | ICD-10-CM | POA: Diagnosis not present

## 2018-07-23 DIAGNOSIS — Z0100 Encounter for examination of eyes and vision without abnormal findings: Secondary | ICD-10-CM | POA: Diagnosis not present

## 2018-07-23 DIAGNOSIS — Z79891 Long term (current) use of opiate analgesic: Secondary | ICD-10-CM | POA: Diagnosis not present

## 2018-07-28 ENCOUNTER — Ambulatory Visit
Admission: RE | Admit: 2018-07-28 | Discharge: 2018-07-28 | Disposition: A | Discharge status: Home / Self Care | Payer: Medicaid Other | Source: Ambulatory Visit | Attending: Gastroenterology | Admitting: Gastroenterology

## 2018-07-28 DIAGNOSIS — K7460 Unspecified cirrhosis of liver: Secondary | ICD-10-CM

## 2018-07-28 DIAGNOSIS — R932 Abnormal findings on diagnostic imaging of liver and biliary tract: Secondary | ICD-10-CM | POA: Diagnosis not present

## 2018-07-28 DIAGNOSIS — Z9049 Acquired absence of other specified parts of digestive tract: Secondary | ICD-10-CM | POA: Diagnosis not present

## 2018-07-29 DIAGNOSIS — K7460 Unspecified cirrhosis of liver: Secondary | ICD-10-CM | POA: Diagnosis not present

## 2018-08-06 DIAGNOSIS — Z87891 Personal history of nicotine dependence: Secondary | ICD-10-CM | POA: Diagnosis not present

## 2018-08-06 DIAGNOSIS — L83 Acanthosis nigricans: Secondary | ICD-10-CM | POA: Diagnosis not present

## 2018-08-06 DIAGNOSIS — L986 Other infiltrative disorders of the skin and subcutaneous tissue: Secondary | ICD-10-CM | POA: Diagnosis not present

## 2018-08-06 DIAGNOSIS — L308 Other specified dermatitis: Secondary | ICD-10-CM | POA: Diagnosis not present

## 2018-08-07 ENCOUNTER — Other Ambulatory Visit: Payer: Self-pay | Admitting: Family Medicine

## 2018-08-07 DIAGNOSIS — F411 Generalized anxiety disorder: Secondary | ICD-10-CM

## 2018-08-18 DIAGNOSIS — Z79891 Long term (current) use of opiate analgesic: Secondary | ICD-10-CM | POA: Diagnosis not present

## 2018-08-18 DIAGNOSIS — M545 Low back pain: Secondary | ICD-10-CM | POA: Diagnosis not present

## 2018-08-18 DIAGNOSIS — G894 Chronic pain syndrome: Secondary | ICD-10-CM | POA: Diagnosis not present

## 2018-09-16 ENCOUNTER — Other Ambulatory Visit: Payer: Self-pay | Admitting: Family Medicine

## 2018-09-16 DIAGNOSIS — F411 Generalized anxiety disorder: Secondary | ICD-10-CM

## 2018-09-17 DIAGNOSIS — Z79891 Long term (current) use of opiate analgesic: Secondary | ICD-10-CM | POA: Diagnosis not present

## 2018-09-17 DIAGNOSIS — G894 Chronic pain syndrome: Secondary | ICD-10-CM | POA: Diagnosis not present

## 2018-09-17 DIAGNOSIS — M545 Low back pain: Secondary | ICD-10-CM | POA: Diagnosis not present

## 2018-09-29 ENCOUNTER — Other Ambulatory Visit: Payer: Self-pay | Admitting: Family Medicine

## 2018-09-29 DIAGNOSIS — J449 Chronic obstructive pulmonary disease, unspecified: Secondary | ICD-10-CM

## 2018-10-13 DIAGNOSIS — M545 Low back pain: Secondary | ICD-10-CM | POA: Diagnosis not present

## 2018-10-13 DIAGNOSIS — Z79891 Long term (current) use of opiate analgesic: Secondary | ICD-10-CM | POA: Diagnosis not present

## 2018-10-13 DIAGNOSIS — G894 Chronic pain syndrome: Secondary | ICD-10-CM | POA: Diagnosis not present

## 2018-10-15 ENCOUNTER — Ambulatory Visit (INDEPENDENT_AMBULATORY_CARE_PROVIDER_SITE_OTHER): Payer: Medicare HMO | Admitting: *Deleted

## 2018-10-15 ENCOUNTER — Other Ambulatory Visit: Payer: Self-pay

## 2018-10-15 DIAGNOSIS — Z23 Encounter for immunization: Secondary | ICD-10-CM | POA: Diagnosis not present

## 2018-10-19 ENCOUNTER — Other Ambulatory Visit: Payer: Self-pay | Admitting: Family Medicine

## 2018-10-19 DIAGNOSIS — F411 Generalized anxiety disorder: Secondary | ICD-10-CM

## 2018-10-29 DIAGNOSIS — L308 Other specified dermatitis: Secondary | ICD-10-CM | POA: Diagnosis not present

## 2018-11-03 DIAGNOSIS — L235 Allergic contact dermatitis due to other chemical products: Secondary | ICD-10-CM | POA: Diagnosis not present

## 2018-11-10 DIAGNOSIS — G894 Chronic pain syndrome: Secondary | ICD-10-CM | POA: Diagnosis not present

## 2018-11-10 DIAGNOSIS — M545 Low back pain: Secondary | ICD-10-CM | POA: Diagnosis not present

## 2018-11-10 DIAGNOSIS — Z79891 Long term (current) use of opiate analgesic: Secondary | ICD-10-CM | POA: Diagnosis not present

## 2018-11-19 DIAGNOSIS — Z79899 Other long term (current) drug therapy: Secondary | ICD-10-CM | POA: Diagnosis not present

## 2018-11-19 DIAGNOSIS — L299 Pruritus, unspecified: Secondary | ICD-10-CM | POA: Diagnosis not present

## 2018-11-19 DIAGNOSIS — L308 Other specified dermatitis: Secondary | ICD-10-CM | POA: Diagnosis not present

## 2018-11-20 ENCOUNTER — Other Ambulatory Visit: Payer: Self-pay | Admitting: *Deleted

## 2018-11-20 DIAGNOSIS — F411 Generalized anxiety disorder: Secondary | ICD-10-CM

## 2018-11-20 MED ORDER — ALPRAZOLAM 1 MG PO TABS: ORAL_TABLET | ORAL | 0 refills | Status: DC

## 2018-11-24 ENCOUNTER — Other Ambulatory Visit: Payer: Self-pay

## 2018-11-24 ENCOUNTER — Ambulatory Visit (INDEPENDENT_AMBULATORY_CARE_PROVIDER_SITE_OTHER): Payer: Medicare HMO | Admitting: Family Medicine

## 2018-11-24 DIAGNOSIS — B182 Chronic viral hepatitis C: Secondary | ICD-10-CM

## 2018-11-24 DIAGNOSIS — R739 Hyperglycemia, unspecified: Secondary | ICD-10-CM | POA: Insufficient documentation

## 2018-11-24 DIAGNOSIS — N179 Acute kidney failure, unspecified: Secondary | ICD-10-CM | POA: Diagnosis not present

## 2018-11-24 DIAGNOSIS — R778 Other specified abnormalities of plasma proteins: Secondary | ICD-10-CM | POA: Diagnosis not present

## 2018-11-24 DIAGNOSIS — E785 Hyperlipidemia, unspecified: Secondary | ICD-10-CM | POA: Diagnosis not present

## 2018-11-24 DIAGNOSIS — L299 Pruritus, unspecified: Secondary | ICD-10-CM | POA: Diagnosis not present

## 2018-11-24 DIAGNOSIS — L308 Other specified dermatitis: Secondary | ICD-10-CM | POA: Diagnosis not present

## 2018-11-24 DIAGNOSIS — R945 Abnormal results of liver function studies: Secondary | ICD-10-CM | POA: Diagnosis not present

## 2018-11-24 DIAGNOSIS — Z79899 Other long term (current) drug therapy: Secondary | ICD-10-CM | POA: Diagnosis not present

## 2018-11-24 LAB — POCT GLYCOSYLATED HEMOGLOBIN (HGB A1C): HbA1c, POC (controlled diabetic range): 9.8 % — AB (ref 0.0–7.0)

## 2018-11-24 NOTE — Assessment & Plan Note (Signed)
Patient with mild hypercalcemia.  Also with mild elevated creatinine and elevated total protein.  Concern for possible multiple myeloma.  Will repeat CMP to evaluate hypercalcemia.  Also will get SPEP and UPEP to evaluate further.

## 2018-11-24 NOTE — Assessment & Plan Note (Signed)
Patient with hyperlipidemia on recent labs.  Patient follow-up with PCP to discuss ongoing treatment.

## 2018-11-24 NOTE — Assessment & Plan Note (Signed)
Mild AKI on recent labs.  Evaluating for possible multiple myeloma.  Patient also HCTZ.  Will monitor with repeat CMP. 

## 2018-11-24 NOTE — Assessment & Plan Note (Signed)
Patient presenting with hyperglycemia of 200 on recent CMP.  A1c today shows significantly elevated A1c at 9.8.  Likely from chronic steroid use.  Patient to follow-up with PCP to discuss further evaluation.

## 2018-11-24 NOTE — Patient Instructions (Signed)
It was a pleasure to see you today! Thank you for choosing [facility] for your primary care. Stacy Campbell was seen for abnormal labs.  We are retesting your labs that were abnormal and that were recommended by your dermatologist.  We are also checking for diabetes to your elevated blood sugar.  We will call you with these results.  Please follow-up with Dr. Criss Rosales once we have discussed this with you.   Best,  Marny Lowenstein, MD, MS FAMILY MEDICINE RESIDENT - PGY3 11/24/2018 2:43 PM

## 2018-11-24 NOTE — Progress Notes (Signed)
    Subjective:  Stacy Campbell is a 63 y.o. female who presents to the [facility] today with a chief complaint of abnormal lab results.   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  Dermatology was planning to trial immunosuppressive medication such as methotrexate. As part of lab work that was obtained by the dermatologist to assess for treatment appropriateness, patient was found to have abnormalities in elevated transaminases, elevated creatinine, elevated calcium, elevated total protein, hyperglycemia, hyperlipidemia.  These labs were obtained on 11/21/2018.  Dermatology recommended following up with PCP for lab abnormalities and repeating labs including CBC, CMP, SPEP, UPEP before starting any systemic immunosuppression. She has a history of hepatitis C.  LFTs have been normal in the recent past.  She has followed with Dr. Paulita Fujita in the past.  She has a scheduled follow-up with him in December.  Patient denies any abdominal pain, fevers, chills, urinary symptoms.  Patient is tolerating p.o. well.  Does endorse some occasional nausea.  Patient is on chronic steroids for her dermatitis.  ROS: Per HPI  PMH: Smoking history reviewed.    Objective:  Physical Exam: BP (!) 160/76   Pulse 88   Ht '5\' 5"'$  (1.651 m)   Wt 231 lb 8 oz (105 kg)   SpO2 96%   BMI 38.52 kg/m   Gen: NAD, resting comfortably CV: RRR with no murmurs appreciated Pulm: NWOB, CTAB with no crackles, wheezes, or rhonchi GI:  Soft, Nontender, Nondistended. Skin: warm, dry Neuro: grossly normal, moves all extremities Psych: Normal affect and thought content  Results for orders placed or performed in visit on 11/24/18 (from the past 72 hour(s))  POC Hemoglobin A1c (dx code Z13.1)     Status: Abnormal   Collection Time: 11/24/18  2:50 PM  Result Value Ref Range   Hemoglobin A1C     HbA1c POC (<> result, manual entry)     HbA1c, POC (prediabetic range)     HbA1c, POC (controlled  diabetic range) 9.8 (A) 0.0 - 7.0 %     Assessment/Plan:  Hyperglycemia Patient presenting with hyperglycemia of 200 on recent CMP.  A1c today shows significantly elevated A1c at 9.8.  Likely from chronic steroid use.  Patient to follow-up with PCP to discuss further evaluation.  Hypercalcemia Patient with mild hypercalcemia.  Also with mild elevated creatinine and elevated total protein.  Concern for possible multiple myeloma.  Will repeat CMP to evaluate hypercalcemia.  Also will get SPEP and UPEP to evaluate further.  AKI (acute kidney injury) ([place]) Mild AKI on recent labs.  Evaluating for possible multiple myeloma.  Patient also HCTZ.  Will monitor with repeat CMP.  Hepatitis C Patient with history of hepatitis C.  Status post treatment 2015.  Patient has mildly elevated transaminases.  Patient already has follow-up scheduled with Dr. Robyne Askew December.  We will repeat LFTs.  Hyperlipidemia Patient with hyperlipidemia on recent labs.  Patient follow-up with PCP to discuss ongoing treatment.    Lab Orders     CBC     CMP (comprehensive metabolic panel)     Protein electrophoresis, serum     Protein Electrophoresis, Urine Rflx.     POC Hemoglobin A1c (dx code Z13.1)  No orders of the defined types were placed in this encounter.     Marny Lowenstein, MD, MS FAMILY MEDICINE RESIDENT - PGY3 11/24/2018 3:58 PM

## 2018-11-24 NOTE — Assessment & Plan Note (Signed)
Patient with history of hepatitis C.  Status post treatment 2015.  Patient has mildly elevated transaminases.  Patient already has follow-up scheduled with Dr. Robyne Askew December.  We will repeat LFTs.

## 2018-11-25 ENCOUNTER — Encounter: Payer: Self-pay | Admitting: Family Medicine

## 2018-11-25 NOTE — Progress Notes (Signed)
Sent patient a letter discussing labs and need to follow up with you.

## 2018-11-25 NOTE — Progress Notes (Signed)
Spoke with pt. Informed her of note. Pt understood and she that at her next appt she would discuss this with the doctor. Salvatore Marvel, CMA

## 2018-11-26 LAB — COMPREHENSIVE METABOLIC PANEL
ALT: 72 IU/L — ABNORMAL HIGH (ref 0–32)
AST: 50 IU/L — ABNORMAL HIGH (ref 0–40)
Albumin/Globulin Ratio: 1.3 (ref 1.2–2.2)
Albumin: 4.2 g/dL (ref 3.8–4.8)
Alkaline Phosphatase: 93 IU/L (ref 39–117)
BUN/Creatinine Ratio: 17 (ref 12–28)
BUN: 17 mg/dL (ref 8–27)
Bilirubin Total: <0.2 mg/dL (ref 0.0–1.2)
CO2: 21 mmol/L (ref 20–29)
Calcium: 9.2 mg/dL (ref 8.7–10.3)
Chloride: 100 mmol/L (ref 96–106)
Creatinine, Ser: 1.01 mg/dL — ABNORMAL HIGH (ref 0.57–1.00)
GFR calc Af Amer: 68 mL/min/{1.73_m2} (ref >59)
GFR calc non Af Amer: 59 mL/min/{1.73_m2} — ABNORMAL LOW (ref >59)
Globulin, Total: 3.2 g/dL (ref 1.5–4.5)
Glucose: 427 mg/dL — ABNORMAL HIGH (ref 65–99)
Potassium: 4.3 mmol/L (ref 3.5–5.2)
Sodium: 141 mmol/L (ref 134–144)
Total Protein: 7.4 g/dL (ref 6.0–8.5)

## 2018-11-26 LAB — CBC
Hematocrit: 43.5 % (ref 34.0–46.6)
Hemoglobin: 13.6 g/dL (ref 11.1–15.9)
MCH: 22.9 pg — ABNORMAL LOW (ref 26.6–33.0)
MCHC: 31.3 g/dL — ABNORMAL LOW (ref 31.5–35.7)
MCV: 73 fL — ABNORMAL LOW (ref 79–97)
Platelets: 229 10*3/uL (ref 150–450)
RBC: 5.95 x10E6/uL — ABNORMAL HIGH (ref 3.77–5.28)
RDW: 13.9 % (ref 11.7–15.4)
WBC: 7.4 10*3/uL (ref 3.4–10.8)

## 2018-11-26 LAB — PROTEIN ELECTROPHORESIS, SERUM
A/G Ratio: 1 (ref 0.7–1.7)
Albumin ELP: 3.7 g/dL (ref 2.9–4.4)
Alpha 1: 0.3 g/dL (ref 0.0–0.4)
Alpha 2: 0.8 g/dL (ref 0.4–1.0)
Beta: 1.4 g/dL — ABNORMAL HIGH (ref 0.7–1.3)
Gamma Globulin: 1.2 g/dL (ref 0.4–1.8)
Globulin, Total: 3.7 g/dL (ref 2.2–3.9)

## 2018-12-03 ENCOUNTER — Other Ambulatory Visit: Payer: Self-pay | Admitting: Family Medicine

## 2018-12-03 NOTE — Progress Notes (Signed)
Documenting that patient's dermatologist from [facility], Michael Boston, MD is starting patient on methotrexate.  This has been added to patient's medication list  Dr. Criss Rosales

## 2018-12-09 DIAGNOSIS — G894 Chronic pain syndrome: Secondary | ICD-10-CM | POA: Diagnosis not present

## 2018-12-09 DIAGNOSIS — M545 Low back pain: Secondary | ICD-10-CM | POA: Diagnosis not present

## 2018-12-09 DIAGNOSIS — K7460 Unspecified cirrhosis of liver: Secondary | ICD-10-CM

## 2018-12-09 DIAGNOSIS — Z79891 Long term (current) use of opiate analgesic: Secondary | ICD-10-CM | POA: Diagnosis not present

## 2018-12-09 HISTORY — DX: Unspecified cirrhosis of liver: K74.60

## 2018-12-10 ENCOUNTER — Other Ambulatory Visit: Payer: Self-pay | Admitting: Family Medicine

## 2018-12-10 MED ORDER — ARIPIPRAZOLE 5 MG PO TABS: 5.0000 mg | ORAL_TABLET | Freq: Every day | ORAL | 0 refills | Status: DC

## 2018-12-11 ENCOUNTER — Other Ambulatory Visit: Payer: Self-pay

## 2018-12-11 ENCOUNTER — Encounter: Payer: Self-pay | Admitting: Family Medicine

## 2018-12-11 ENCOUNTER — Other Ambulatory Visit: Payer: Self-pay | Admitting: Family Medicine

## 2018-12-11 ENCOUNTER — Ambulatory Visit (INDEPENDENT_AMBULATORY_CARE_PROVIDER_SITE_OTHER): Payer: Medicare HMO | Admitting: Family Medicine

## 2018-12-11 VITALS — BP 142/74 | HR 82 | Wt 231.6 lb

## 2018-12-11 DIAGNOSIS — E1169 Type 2 diabetes mellitus with other specified complication: Secondary | ICD-10-CM

## 2018-12-11 DIAGNOSIS — R21 Rash and other nonspecific skin eruption: Secondary | ICD-10-CM

## 2018-12-11 DIAGNOSIS — E785 Hyperlipidemia, unspecified: Secondary | ICD-10-CM

## 2018-12-11 DIAGNOSIS — E119 Type 2 diabetes mellitus without complications: Secondary | ICD-10-CM | POA: Diagnosis not present

## 2018-12-11 MED ORDER — METFORMIN HCL 500 MG PO TABS: 500.0000 mg | ORAL_TABLET | Freq: Two times a day (BID) | ORAL | 3 refills | Status: DC

## 2018-12-11 MED ORDER — HYDROXYZINE HCL 25 MG PO TABS: 25.0000 mg | ORAL_TABLET | Freq: Three times a day (TID) | ORAL | 2 refills | Status: DC

## 2018-12-11 NOTE — Patient Instructions (Signed)
Today we talked about 4 major task.  1: Please call Dr. Jenne Campus our nutritionist to discuss how your diet can help control your diabetes 2: Please start taking the Metformin slowly, you can start with 1 tablet/day and slowly work up to 2 tablets in the morning and 2 tablets at night.  You can do this as long as your stomach tolerates it. 3: You can take the Atarax that I have written for you as a way to try and help reduce your scratching.  Please do everything you can to avoid scratching or sores 4.  Please call Dr. Paulita Fujita.  You can tell them that in 2014 you were treated for hepatitis at the [facility] liver center.  Those records are currently in the epic computer system.  Please come back to see me in 1 week

## 2018-12-12 ENCOUNTER — Encounter: Payer: Self-pay | Admitting: Family Medicine

## 2018-12-12 DIAGNOSIS — E119 Type 2 diabetes mellitus without complications: Secondary | ICD-10-CM | POA: Insufficient documentation

## 2018-12-12 DIAGNOSIS — E1169 Type 2 diabetes mellitus with other specified complication: Secondary | ICD-10-CM | POA: Insufficient documentation

## 2018-12-12 LAB — LIPID PANEL
Chol/HDL Ratio: 5.6 ratio — ABNORMAL HIGH (ref 0.0–4.4)
Cholesterol, Total: 178 mg/dL (ref 100–199)
HDL: 32 mg/dL — ABNORMAL LOW (ref >39)
LDL Chol Calc (NIH): 111 mg/dL — ABNORMAL HIGH (ref 0–99)
Triglycerides: 196 mg/dL — ABNORMAL HIGH (ref 0–149)
VLDL Cholesterol Cal: 35 mg/dL (ref 5–40)

## 2018-12-12 NOTE — Assessment & Plan Note (Signed)
Patient also seen by Dr. 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  Apparently per verbal report from the patient the allergist was unable to determine what the per specific trigger was because she has "allergies to all the stuff ".  We are prescribing some Atarax and hoping to help her stop scratching

## 2018-12-12 NOTE — Assessment & Plan Note (Signed)
New diagnosis with A1c of 9.8.  Patient says she was unaware prior to today  Referral placed to nutrition for dietary counseling as well as starting of Metformin which she will titrate up from low-dose.  Will come back in approximately 1 week to discuss starting second medication (either Jardiance or GLP-1 injectable).  Patient's goal is to avoid insulin

## 2018-12-12 NOTE — Progress Notes (Signed)
Subjective:  AUBRA WALCH is a 63 y.o. female who presents to the [facility] today with a chief complaint of skin rash follow-up.   HPI: Rash and other nonspecific skin eruption Patient also seen by Dr. 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  Apparently per verbal report from the patient the allergist was unable to determine what the per specific trigger was because she has "allergies to all the stuff ".    Diabetes mellitus without complication ([place]) New diagnosis with A1c of 9.8.  Patient says she was unaware prior to today  Objective:  Physical Exam: BP (!) 142/74   Pulse 82   Wt 231 lb 9.6 oz (105.1 kg)   SpO2 98%   BMI 38.54 kg/m   Gen: NAD, conversing comfortably, was tearful when discussing diabetes CV: RRR with no murmurs appreciated Pulm: NWOB, CTAB with no crackles, wheezes, or rhonchi GI: Normal bowel sounds present. Soft, Nontender, Nondistended. MSK: no edema, cyanosis, or clubbing noted Skin: warm, dry Neuro: grossly normal, moves all extremities Psych: Normal affect and thought content  Results for orders placed or performed in visit on 12/11/18 (from the past 72 hour(s))  Lipid Panel     Status: Abnormal   Collection Time: 12/11/18  3:36 PM  Result Value Ref Range   Cholesterol, Total 178 100 - 199 mg/dL   Triglycerides 196 (H) 0 - 149 mg/dL   HDL 32 (L) >39 mg/dL   VLDL Cholesterol Cal 35 5 - 40 mg/dL   LDL Chol Calc (NIH) 111 (H) 0 - 99 mg/dL   Chol/HDL Ratio 5.6 (H) 0.0 - 4.4 ratio    Comment:                                   T. Chol/HDL Ratio                                             Men  Women                               1/2 Avg.Risk  3.4    3.3                                   Avg.Risk  5.0    4.4                                2X Avg.Risk  9.6    7.1                                3X Avg.Risk 23.4   11.0      Assessment/Plan:  Hyperlipidemia Confirmed LDL at 111 over goal of  70, will discuss starting statin with patient when she comes back for visit.  She should be here in another week or so to discuss further diabetes control  Rash and other nonspecific skin eruption Patient also seen by Dr. 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  Apparently per verbal  report from the patient the allergist was unable to determine what the per specific trigger was because she has "allergies to all the stuff ".  We are prescribing some Atarax and hoping to help her stop scratching  Hyperlipidemia associated with type 2 diabetes mellitus ([place]) We will talk about starting statin at her next visit middle of December where we are also going to start working on her diabetes medication further.  Diabetes mellitus without complication ([place]) New diagnosis with A1c of 9.8.  Patient says she was unaware prior to today  Referral placed to nutrition for dietary counseling as well as starting of Metformin which she will titrate up from low-dose.  Will come back in approximately 1 week to discuss starting second medication (either Jardiance or GLP-1 injectable).  Patient's goal is to avoid insulin    Sherene Sires, DO FAMILY MEDICINE RESIDENT - PGY3 12/12/2018 9:15 AM

## 2018-12-12 NOTE — Assessment & Plan Note (Signed)
Confirmed LDL at 111 over goal of 70, will discuss starting statin with patient when she comes back for visit.  She should be here in another week or so to discuss further diabetes control

## 2018-12-12 NOTE — Assessment & Plan Note (Signed)
We will talk about starting statin at her next visit middle of December where we are also going to start working on her diabetes medication further.

## 2018-12-15 DIAGNOSIS — Z79899 Other long term (current) drug therapy: Secondary | ICD-10-CM | POA: Diagnosis not present

## 2018-12-15 DIAGNOSIS — L299 Pruritus, unspecified: Secondary | ICD-10-CM | POA: Diagnosis not present

## 2018-12-15 DIAGNOSIS — L308 Other specified dermatitis: Secondary | ICD-10-CM | POA: Diagnosis not present

## 2018-12-16 ENCOUNTER — Telehealth: Payer: Self-pay | Admitting: *Deleted

## 2018-12-16 DIAGNOSIS — E119 Type 2 diabetes mellitus without complications: Secondary | ICD-10-CM

## 2018-12-16 MED ORDER — GLUCOSE BLOOD VI STRP: ORAL_STRIP | 12 refills | Status: DC

## 2018-12-16 MED ORDER — ACCU-CHEK SOFT TOUCH LANCETS MISC: 12 refills | Status: DC

## 2018-12-16 MED ORDER — ACCU-CHEK AVIVA PLUS W/DEVICE KIT: 1.0000 | PACK | Freq: Once | 0 refills | Status: AC

## 2018-12-16 NOTE — Telephone Encounter (Signed)
Received fax requesting ACCU-CHEK  AVIVA METER PLUS KIT  ACCU-CHEK AVIVA PLUS TEST STRIPS   ACCU-CHEK AVIVA [place]   Did not see these on current med list.  Form says reason for request: Patient is requesting Rx's for diavetic testing supplies to fill with [facility] for a 90 day supply. Ezri Landers Zimmerman Rumple, CMA

## 2018-12-17 LAB — MICROALBUMIN / CREATININE URINE RATIO
Creatinine, Urine: 184.9 mg/dL
Microalb/Creat Ratio: 12 mg/g creat (ref 0–29)
Microalbumin, Urine: 22.8 ug/mL

## 2018-12-18 ENCOUNTER — Telehealth: Payer: Medicare HMO | Admitting: Family Medicine

## 2018-12-22 ENCOUNTER — Ambulatory Visit (INDEPENDENT_AMBULATORY_CARE_PROVIDER_SITE_OTHER): Payer: Medicare HMO | Admitting: Family Medicine

## 2018-12-22 ENCOUNTER — Other Ambulatory Visit: Payer: Self-pay

## 2018-12-22 DIAGNOSIS — E119 Type 2 diabetes mellitus without complications: Secondary | ICD-10-CM

## 2018-12-22 NOTE — Progress Notes (Signed)
Telehealth Encounter I connected with Stacy Campbell (MRN [id number]) on 12/22/2018 by telephone after she was unable to access MyChart video-enabled application (lost WiFi), verified that I am speaking with the correct person using two identifiers, and that the patient was in a private environment conducive to confidentiality.  The patient agreed to proceed.  Provider was Kennith Center, PhD, RD, LDN, CEDRD Provider(s) located at home during this telehealth encounter; patient was at home  Appt start time: 1330 end time: 1430 (1 hour)  Reason for telehealth visit: Medical Nutrition Therapy for new diagnosis of diabetes, referred by Sherene Sires, MD.    Relevant history/background: Ms. Binner has a hx of HTN and HLD (12/10/18 labs: HDL 32, LDL 111, TG 196), and a recent diagnosis of DM (A1C 9.8 on 12/10/18).  Assessment:  Usual eating pattern: 2 meals and 1-2 snacks per day. Frequent foods and beverages: 0-48 oz soda/day, 12-48 oz fruit juice/day, water; chx, hotdogs, pork chops, hamburgers, spaghetti, fish.   Avoided foods: multiple food allergies (allergic dermatitis), esp pineapple; meatloaf, squash, zucchini, mushrms (disliked).   Usual physical activity: none currently; limited by low back pain.   Sleep: poor; wakes frequently and difficulty falling back to sleep.  Med's have not helped.  To bed ~9 PM, but may be awake till 1 AM.  Gets up by 7 AM to help grandchildren (ages 33 and 72) on computers for school.   24-hr recall:  (Up at 12 PM; fell asleep ~2 AM) B ( AM)-  --- Snk ( AM)-  --- L (12 PM)-  1 c Sugar Smacks,1/2 c whole milk,1 boiled egg,  6 oz orange juice   Snk ( PM)-   D (6 PM)-  1 baloney sandwich, 1 tsp mayo, mustard, 1 tomato, water Snk ( PM)-  water Typical day? No. Never sleeps that late on weekdays, and gets breakfast.  Also had less soda and juice than usual.    Intervention: Completed diet hx, explained Hgb A1C and glycemic control, and established behavioral  goals.    For recommendations and goals, see Patient Instructions.    Follow-up: Telehealth via [place] in 2 weeks.   SYKES,JEANNIE

## 2018-12-22 NOTE — Patient Instructions (Addendum)
-   Your blood sugar has been very high, which we know b/c you had a high Hemoglobin A1C (9.8).  This should be at least under 7.0, and lower than that is even better.  A1C represents the % of hemoglobin in your blood that has a glucose (sugar) molecule attached to it.  When blood sugar levels drop, there is less sugar available to hook onto hemoglobin, so A1C drops.  You do not want your hemoglobin  to have a sugar molecule attached b/c this means it can't do its job so well.  In addition to attaching to hemoglobin, glucose molecules can also attach to other body tissues and substances where it doesn't belong, for example nerves and parts of your eyes.  When this goes uncontrolled, vision and nerve problems happen.  Nerve problems = neuopathies = tingling and pins and needle-like pain.   - After you have made some changes in your diet, Dr. Criss Rosales will re-check your A1C, and see if it has come down.     Diet Recommendations for Diabetes  Carbohydrate includes starch, sugar, and fiber.  Of these, only sugar and starch raise blood glucose.  (Fiber is found in fruits, vegetables [especially skin, seeds, and stalks], beans, and whole grains.)   Starchy (carb) foods: Bread, rice, pasta, potatoes, corn, cereal, grits, crackers, bagels, muffins, all baked goods.  (Fruit, milk, and yogurt also have carbohydrate, but most of these foods will not spike your blood sugar as most starchy foods will.)  A few fruits do cause high blood sugars; use small portions of bananas (limit to 1/2 at a time), grapes, watermelon, oranges, and most tropical fruits.   Protein foods: Meat, fish, poultry, eggs, dairy foods, and beans such as pinto and kidney beans (beans also provide carbohydrate).   1. Eat at least REAL 3 meals and 1-2 snacks per day. Have something to eat at least every 5 hours while awake.  Eat breakfast within the first hour of getting up.   2. Limit starchy foods to ONE to TWO servings per meal and ONE per snack. ONE  portion of a starchy food is equal to the following:   - ONE slice of bread (or its equivalent, such as half of a hamburger bun).   - 1/2 cup of a "scoopable" starchy food such as potatoes or rice.   - 15 grams of Total Carbohydrate as shown on food label.   - Every 4 ounces of a sweet drink (including fruit juice).    (If you feel you can't yet eliminate juice or soda, try diluting juice with club soda.) 3. Drink at least 48 oz of water each day.    Track your progress using the Goals Sheet provided today.    Follow-up: Telehealth via [place] on Monday, 12/28 at 4 PM.

## 2018-12-23 DIAGNOSIS — Z1211 Encounter for screening for malignant neoplasm of colon: Secondary | ICD-10-CM | POA: Diagnosis not present

## 2018-12-23 DIAGNOSIS — B1920 Unspecified viral hepatitis C without hepatic coma: Secondary | ICD-10-CM | POA: Diagnosis not present

## 2018-12-23 DIAGNOSIS — K7460 Unspecified cirrhosis of liver: Secondary | ICD-10-CM | POA: Diagnosis not present

## 2018-12-24 ENCOUNTER — Other Ambulatory Visit: Payer: Self-pay | Admitting: Family Medicine

## 2018-12-24 ENCOUNTER — Encounter: Payer: Self-pay | Admitting: Family Medicine

## 2018-12-24 ENCOUNTER — Other Ambulatory Visit: Payer: Self-pay

## 2018-12-24 ENCOUNTER — Ambulatory Visit (INDEPENDENT_AMBULATORY_CARE_PROVIDER_SITE_OTHER): Payer: Medicare HMO | Admitting: Family Medicine

## 2018-12-24 VITALS — BP 146/74 | HR 93 | Ht 65.0 in | Wt 226.2 lb

## 2018-12-24 DIAGNOSIS — E119 Type 2 diabetes mellitus without complications: Secondary | ICD-10-CM | POA: Diagnosis not present

## 2018-12-24 DIAGNOSIS — F411 Generalized anxiety disorder: Secondary | ICD-10-CM

## 2018-12-24 MED ORDER — METFORMIN HCL ER 500 MG PO TB24: 500.0000 mg | ORAL_TABLET | Freq: Every day | ORAL | 0 refills | Status: DC

## 2018-12-24 MED ORDER — EMPAGLIFLOZIN 10 MG PO TABS: 10.0000 mg | ORAL_TABLET | Freq: Every day | ORAL | 0 refills | Status: DC

## 2018-12-24 NOTE — Patient Instructions (Signed)
It was a pleasure to see you today! Thank you for choosing [facility] for your primary care. Stacy Campbell was seen for diabetes follow-up. Come back to the clinic in 1 month.  Today we talked about your diabetes medicine, we are going to have you stop taking the prior version of Metformin and start taking a once a day version.  If this keeps causing you diarrhea and upset stomach you can stop it altogether.  Were also going to start a medicine called Jardiance, remember you need to stay hydrated with this and it does increase your odds of getting a UTI slightly.  We like to see you back in approximately 1 month.  We can give you a list of the eye doctors in town to get a diabetic eye exam, he did passed your foot exam with me.  Let us know if anything   Please bring all your medications to every doctors visit   Sign up for My Chart to have easy access to your labs results, and communication with your Primary care physician.     Please check-out at the front desk before leaving the clinic.     Best,  Dr. Sherene Sires FAMILY MEDICINE RESIDENT - PGY3 12/24/2018 9:59 AM

## 2018-12-25 NOTE — Assessment & Plan Note (Signed)
Patient now has blood sugar monitoring supplies, will transition from short acting Metformin to extended release at 500 mg hoping this will alleviate stomach upset.  We will also start low-dose Jardiance.  Hopefully we can titrate these medicines until her next A1c check.  Goal to avoid injectables.  Patient will check back in a few weeks

## 2018-12-25 NOTE — Progress Notes (Signed)
    Subjective:  Stacy Campbell is a 63 y.o. female who presents to the [facility] today with a chief complaint of diabetes follow-up.   HPI: Patient with first follow-up after new diagnosis of diabetes.  Has already had nutrition consult and is attempting to adjust diet.  Has been taking the Metformin short acting 500 daily and says she has significant upset stomach and loose stools with this.  Would like to know if there are other options.  Has been checking her blood sugars daily and says that they tend to be in the upper 100s.   Objective:  Physical Exam: BP (!) 146/74   Pulse 93   Ht 5\' 5"  (1.651 m)   Wt 226 lb 3.2 oz (102.6 kg)   SpO2 94%   BMI 37.64 kg/m   Gen: NAD, pleasant CV: RRR with no murmurs appreciated Pulm: NWOB, CTAB with no crackles, wheezes, or rhonchi GI: Normal bowel sounds present. Soft, Nontender, Nondistended. MSK: no edema, cyanosis, or clubbing noted Skin: Diffuse dermatitis, she is following with dermatology for this it is still present but is improved from prior visit Neuro: grossly normal, moves all extremities Psych: Normal affect and thought content  No results found for this or any previous visit (from the past 72 hour(s)).   Assessment/Plan:  Diabetes mellitus without complication ([place]) Patient now has blood sugar monitoring supplies, will transition from short acting Metformin to extended release at 500 mg hoping this will alleviate stomach upset.  We will also start low-dose Jardiance.  Hopefully we can titrate these medicines until her next A1c check.  Goal to avoid injectables.  Patient will check back in a few weeks   Sherene Sires, [place] - PGY3 12/25/2018 12:42 PM

## 2018-12-31 ENCOUNTER — Other Ambulatory Visit: Payer: Self-pay

## 2018-12-31 ENCOUNTER — Ambulatory Visit
Admission: RE | Admit: 2018-12-31 | Discharge: 2018-12-31 | Disposition: A | Discharge status: Home / Self Care | Payer: Medicare HMO | Source: Ambulatory Visit | Attending: Gastroenterology | Admitting: Gastroenterology

## 2018-12-31 DIAGNOSIS — Z1231 Encounter for screening mammogram for malignant neoplasm of breast: Secondary | ICD-10-CM | POA: Diagnosis not present

## 2019-01-05 ENCOUNTER — Other Ambulatory Visit: Payer: Self-pay

## 2019-01-05 ENCOUNTER — Encounter: Payer: Medicare HMO | Admitting: Family Medicine

## 2019-01-05 DIAGNOSIS — Z79891 Long term (current) use of opiate analgesic: Secondary | ICD-10-CM | POA: Diagnosis not present

## 2019-01-05 DIAGNOSIS — M25512 Pain in left shoulder: Secondary | ICD-10-CM | POA: Diagnosis not present

## 2019-01-05 DIAGNOSIS — M545 Low back pain: Secondary | ICD-10-CM | POA: Diagnosis not present

## 2019-01-05 DIAGNOSIS — G894 Chronic pain syndrome: Secondary | ICD-10-CM | POA: Diagnosis not present

## 2019-01-05 NOTE — Progress Notes (Signed)
This encounter was created in error - please disregard.

## 2019-01-13 DIAGNOSIS — L308 Other specified dermatitis: Secondary | ICD-10-CM | POA: Diagnosis not present

## 2019-01-13 DIAGNOSIS — Z79899 Other long term (current) drug therapy: Secondary | ICD-10-CM | POA: Diagnosis not present

## 2019-01-15 DIAGNOSIS — D472 Monoclonal gammopathy: Secondary | ICD-10-CM | POA: Diagnosis not present

## 2019-01-20 ENCOUNTER — Other Ambulatory Visit: Payer: Self-pay | Admitting: Gastroenterology

## 2019-01-20 DIAGNOSIS — K7460 Unspecified cirrhosis of liver: Secondary | ICD-10-CM

## 2019-01-21 ENCOUNTER — Other Ambulatory Visit: Payer: Self-pay

## 2019-01-21 ENCOUNTER — Encounter: Payer: Self-pay | Admitting: Family Medicine

## 2019-01-21 ENCOUNTER — Ambulatory Visit (INDEPENDENT_AMBULATORY_CARE_PROVIDER_SITE_OTHER): Payer: Medicare HMO | Admitting: Family Medicine

## 2019-01-21 VITALS — BP 130/72 | HR 78

## 2019-01-21 DIAGNOSIS — M8000XA Age-related osteoporosis with current pathological fracture, unspecified site, initial encounter for fracture: Secondary | ICD-10-CM

## 2019-01-21 DIAGNOSIS — D472 Monoclonal gammopathy: Secondary | ICD-10-CM

## 2019-01-21 DIAGNOSIS — Z7185 Encounter for immunization safety counseling: Secondary | ICD-10-CM

## 2019-01-21 DIAGNOSIS — Z7189 Other specified counseling: Secondary | ICD-10-CM | POA: Diagnosis not present

## 2019-01-21 NOTE — Assessment & Plan Note (Signed)
Patient noted to have osteoporotic compression fracture, will advised to start alendronate weekly.  Plan to continue for 5 years maximum.

## 2019-01-22 ENCOUNTER — Other Ambulatory Visit: Payer: Self-pay

## 2019-01-22 ENCOUNTER — Ambulatory Visit (INDEPENDENT_AMBULATORY_CARE_PROVIDER_SITE_OTHER): Payer: Medicare HMO | Admitting: Family Medicine

## 2019-01-22 DIAGNOSIS — E119 Type 2 diabetes mellitus without complications: Secondary | ICD-10-CM

## 2019-01-22 NOTE — Patient Instructions (Addendum)
Whenever you eat a starchy food, ALWAYS ask yourself how many starch portions is this equal to?  If it is a food with a nutrition label, check to see how many grams of TOTAL CARBOHYDRATE are in it.  ONE portion of a starchy food is equal to the following:              - ONE slice of bread (or its equivalent, such as half of a hamburger bun).              - 1/2 cup of a "scoopable" starchy food such as potatoes or rice.              - 15 grams of Total Carbohydrate as shown on food label.              - Every 4 ounces of a sweet drink, including fruit juice.  (If you feel you can't yet eliminate juice or soda, try diluting juice with club soda.)  Specific Goals: 1. Limit starchy foods to 1-2 portions per meal.   2. Include a fruit or vegetable in at least 2 meals per day.  (It's best to EAT your fruit rather than drink it.) 3. Continue to check fasting blood glucose daily, and record them on your tablet or on the form provided today.  (Or you may want to write them on your Goals Sheet to keep everything together.)  Follow-up: Telehealth via MyChart on Thursday, Feb 11 at 1:30 PM.

## 2019-01-22 NOTE — Progress Notes (Signed)
Telehealth Encounter I connected with Stacy Campbell (MRN [id number]) on 01/22/2019 via telephone (again could not access MyChart video-enabled application), verified that I am speaking with the correct person using two identifiers, and that the patient was in an environment conducive to confidentiality.  The patient agreed to proceed.  Provider was Kennith Center, PhD, RD, LDN, CEDRD Provider(s) located at [facility] during this telehealth encounter; patient was at home  Appt start time: 1000 end time: 1100 (1 hour)  Reason for telehealth visit: Medical Nutrition Therapy for new diagnosis of diabetes, referred by Sherene Sires, MD.    Relevant history/background: Stacy Campbell has a hx of HTN and HLD (12/10/18 labs: HDL 32, LDL 111, TG 196), and a recent diagnosis of DM (A1C 9.8 on 12/10/18).  Assessment: FBG usually ~130.  Usually drinks 64 oz water/day, has eliminated soda, and has limited juices.  Still struggles to eat 3 full meals/day. Appetite is small, and physical activity is limited.    Recent eating pattern: 2-3 meals and 1-2 snacks per day.  Recent physical activity: none currently; limited by low back pain; starting PT tomorrow.  Sleep: poor; wakes frequently and lies awake for long periods of the night.   24-hr recall:  (Up at 8 AM) Dr's appt early B (9:30 AM)-  1 pkt inst oatmeal, 1 slc toast, 1 tsp margarine, water Snk ( AM)-  --- L (1 PM)-  1 pk pbutter Nabs, 10 oz diet cran-grape juice Snk ( PM)-  --- D (7 PM)-  1 c brown rice, 1 chx leg w/ skin, 1 c collards, water Snk ( PM)-  water Typical day? No.  Usually has 1 or 2 snacks each day.    Intervention: Reviewed progress on behavioral goals, and reiterated importance of limiting carbohydrate for glycemic control.  Emphasized need to learn what represents a carb portion to be able to control carb intake, e.g., inst oatmeal = 32 g carb + toast = 3 carb portions for yesterday's bkfst.    For  recommendations and goals, see Patient Instructions.   Handouts provided (via both email & [employer]):  - Recommendations from today - Sleep Hygiene - Fasting BG form - Goals Sheet - Instructions to access MyChart video visit  Follow-up: Telehealth via MyChart in 4 weeks.   Stacy Campbell,Stacy Campbell

## 2019-01-23 ENCOUNTER — Telehealth: Payer: Self-pay | Admitting: Family Medicine

## 2019-01-23 DIAGNOSIS — M79604 Pain in right leg: Secondary | ICD-10-CM | POA: Diagnosis not present

## 2019-01-23 DIAGNOSIS — M545 Low back pain: Secondary | ICD-10-CM | POA: Diagnosis not present

## 2019-01-23 DIAGNOSIS — M25512 Pain in left shoulder: Secondary | ICD-10-CM | POA: Diagnosis not present

## 2019-01-23 DIAGNOSIS — D472 Monoclonal gammopathy: Secondary | ICD-10-CM | POA: Insufficient documentation

## 2019-01-23 MED ORDER — ALENDRONATE SODIUM 70 MG PO TABS: 70.0000 mg | ORAL_TABLET | ORAL | 3 refills | Status: AC

## 2019-01-23 NOTE — Progress Notes (Signed)
    Subjective:  Stacy Campbell is a 64 y.o. female who presents to the [facility] today with a chief complaint of follow-up for rash and medication update.   HPI: Vaccine counseling When discussing potential of getting Covid vaccine when she becomes eligible, patient states that she was told by dermatology that she is not allowed to get any vaccines.  She did not seem to remember the specifics of this conversation but I told her that I was unaware of any contraindication at this time.  Monoclonal gammopathy of unknown significance (MGUS) Now following with [facility], new finding of MGUS and started on mycophenolate.  Added to problem list and medication list, she will continue to follow with them   Objective:  Physical Exam: BP 130/72   Pulse 78   SpO2 97%   Gen: NAD, conversing comfortably CV: RRR with no murmurs appreciated Pulm: NWOB, CTAB with no crackles, wheezes, or rhonchi GI: Normal bowel sounds present. Soft, Nontender, Nondistended. MSK: no edema, cyanosis, or clubbing noted Skin: warm, dry, improving rash Neuro: grossly normal, moves all extremities Psych: Normal affect and thought content  No results found for this or any previous visit (from the past 72 hour(s)).   Assessment/Plan:  Osteoporosis with current pathological fracture Patient noted to have osteoporotic compression fracture, will advised to start alendronate weekly.  Plan to continue for 5 years maximum.  Vaccine counseling When discussing potential of getting Covid vaccine when she becomes eligible, patient states that she was told by dermatology that she is not allowed to get any vaccines.  She did not seem to remember the specifics of this conversation but I told her that I was unaware of any contraindication at this time.  I called Dr. Monte Fantasia office at [facility] dermatology to try to learn what the concern was or if there was a miscommunication.  At the writing of this note I have not heard back yet   Monoclonal gammopathy of unknown significance (MGUS) Now following with [facility], new finding of MGUS and started on mycophenolate.  Added to problem list and medication list, she will continue to follow with them   Sherene Sires, [place] - PGY3 01/23/2019 10:34 AM

## 2019-01-23 NOTE — Telephone Encounter (Signed)
Called patient to discuss starting alendronate for osteoporosis.  She was unable to pick up so I left a message for her to call back, if she calls back please schedule a telemedicine visit with me on Wednesday to discuss starting alendronate.  It is already been sent to her pharmacy but I want to talk to her before she starts taking it because there are some very specific instructions on how to take this medication.  -Dr. Criss Rosales

## 2019-01-23 NOTE — Assessment & Plan Note (Signed)
When discussing potential of getting Covid vaccine when she becomes eligible, patient states that she was told by dermatology that she is not allowed to get any vaccines.  She did not seem to remember the specifics of this conversation but I told her that I was unaware of any contraindication at this time.  I called Dr. Monte Fantasia office at [facility] dermatology to try to learn what the concern was or if there was a miscommunication.  At the writing of this note I have not heard back yet

## 2019-01-23 NOTE — Assessment & Plan Note (Signed)
Now following with [facility], new finding of MGUS and started on mycophenolate.  Added to problem list and medication list, she will continue to follow with them

## 2019-01-23 NOTE — Telephone Encounter (Signed)
Patient returned phone call and scheduled virtual appointment for Wednesday afternoon at 1410.   Talbot Grumbling, RN c

## 2019-01-28 ENCOUNTER — Other Ambulatory Visit: Payer: Self-pay | Admitting: Family Medicine

## 2019-01-28 ENCOUNTER — Ambulatory Visit
Admission: RE | Admit: 2019-01-28 | Discharge: 2019-01-28 | Disposition: A | Discharge status: Home / Self Care | Payer: Medicare HMO | Source: Ambulatory Visit | Attending: Gastroenterology | Admitting: Gastroenterology

## 2019-01-28 ENCOUNTER — Other Ambulatory Visit: Payer: Self-pay

## 2019-01-28 ENCOUNTER — Telehealth (INDEPENDENT_AMBULATORY_CARE_PROVIDER_SITE_OTHER): Payer: Medicare HMO | Admitting: Family Medicine

## 2019-01-28 DIAGNOSIS — E119 Type 2 diabetes mellitus without complications: Secondary | ICD-10-CM

## 2019-01-28 DIAGNOSIS — M8000XA Age-related osteoporosis with current pathological fracture, unspecified site, initial encounter for fracture: Secondary | ICD-10-CM

## 2019-01-28 DIAGNOSIS — K7460 Unspecified cirrhosis of liver: Secondary | ICD-10-CM

## 2019-01-28 DIAGNOSIS — K7689 Other specified diseases of liver: Secondary | ICD-10-CM | POA: Diagnosis not present

## 2019-01-28 DIAGNOSIS — B1920 Unspecified viral hepatitis C without hepatic coma: Secondary | ICD-10-CM | POA: Diagnosis not present

## 2019-01-28 DIAGNOSIS — F411 Generalized anxiety disorder: Secondary | ICD-10-CM

## 2019-01-28 MED ORDER — ALPRAZOLAM 1 MG PO TABS: ORAL_TABLET | ORAL | 0 refills | Status: DC

## 2019-01-28 NOTE — Progress Notes (Signed)
Virtual Visit via Telephone Note  I connected with Stacy Campbell on 01/28/19 at  2:10 PM EST by telephone and verified that I am speaking with the correct person using two identifiers.  Location: Patient: At home Provider: [facility] clinic   I discussed the limitations, risks, security and privacy concerns of performing an evaluation and management service by telephone and the availability of in person appointments. I also discussed with the patient that there may be a patient responsible charge related to this service. The patient expressed understanding and agreed to proceed.   History of Present Illness: Patient with compression fracture on prior imaging, I have ordered DEXA scan and schedule this visit to discuss specific indications and method for taking alendronate.  Patient is feeling well and has no significant pain or new injury to discuss.  We went over taking the alendronate weekly, first thing in the morning with a full glass of water on an empty stomach.  We discussed staying upright and not eating or drinking anything else for at least 30 minutes.  We discussed the purpose of getting a DEXA scan and patient understands and will schedule with the breast center.   Observations/Objective: Speaking in full sentences, was able to repeat the instructions for the medication using teach back, no distress  Assessment and Plan: Patient will get DEXA scan and start taking alendronate which is already been ordered.  Follow Up Instructions:    I discussed the assessment and treatment plan with the patient. The patient was provided an opportunity to ask questions and all were answered. The patient agreed with the plan and demonstrated an understanding of the instructions.   The patient was advised to call back or seek an in-person evaluation if the symptoms worsen or if the condition fails to improve as anticipated.  I provided 10 minutes of non-face-to-face time during this  encounter.   Sherene Sires, DO

## 2019-01-29 ENCOUNTER — Other Ambulatory Visit: Payer: Medicare HMO

## 2019-01-30 ENCOUNTER — Other Ambulatory Visit: Payer: Self-pay | Admitting: Gastroenterology

## 2019-01-30 DIAGNOSIS — K7460 Unspecified cirrhosis of liver: Secondary | ICD-10-CM

## 2019-02-03 DIAGNOSIS — L308 Other specified dermatitis: Secondary | ICD-10-CM | POA: Diagnosis not present

## 2019-02-03 DIAGNOSIS — Z79899 Other long term (current) drug therapy: Secondary | ICD-10-CM | POA: Diagnosis not present

## 2019-02-04 DIAGNOSIS — Z79891 Long term (current) use of opiate analgesic: Secondary | ICD-10-CM | POA: Diagnosis not present

## 2019-02-04 DIAGNOSIS — M25512 Pain in left shoulder: Secondary | ICD-10-CM | POA: Diagnosis not present

## 2019-02-04 DIAGNOSIS — G894 Chronic pain syndrome: Secondary | ICD-10-CM | POA: Diagnosis not present

## 2019-02-04 DIAGNOSIS — M545 Low back pain: Secondary | ICD-10-CM | POA: Diagnosis not present

## 2019-02-09 DIAGNOSIS — M79604 Pain in right leg: Secondary | ICD-10-CM | POA: Diagnosis not present

## 2019-02-09 DIAGNOSIS — M25512 Pain in left shoulder: Secondary | ICD-10-CM | POA: Diagnosis not present

## 2019-02-09 DIAGNOSIS — M545 Low back pain: Secondary | ICD-10-CM | POA: Diagnosis not present

## 2019-02-13 DIAGNOSIS — D472 Monoclonal gammopathy: Secondary | ICD-10-CM | POA: Diagnosis not present

## 2019-02-16 DIAGNOSIS — M79604 Pain in right leg: Secondary | ICD-10-CM | POA: Diagnosis not present

## 2019-02-16 DIAGNOSIS — M545 Low back pain: Secondary | ICD-10-CM | POA: Diagnosis not present

## 2019-02-16 DIAGNOSIS — M25512 Pain in left shoulder: Secondary | ICD-10-CM | POA: Diagnosis not present

## 2019-02-17 DIAGNOSIS — L308 Other specified dermatitis: Secondary | ICD-10-CM | POA: Diagnosis not present

## 2019-02-17 DIAGNOSIS — Z79899 Other long term (current) drug therapy: Secondary | ICD-10-CM | POA: Diagnosis not present

## 2019-02-18 DIAGNOSIS — M79604 Pain in right leg: Secondary | ICD-10-CM | POA: Diagnosis not present

## 2019-02-18 DIAGNOSIS — M545 Low back pain: Secondary | ICD-10-CM | POA: Diagnosis not present

## 2019-02-18 DIAGNOSIS — M25512 Pain in left shoulder: Secondary | ICD-10-CM | POA: Diagnosis not present

## 2019-02-19 ENCOUNTER — Other Ambulatory Visit: Payer: Self-pay

## 2019-02-19 ENCOUNTER — Encounter: Payer: Medicare HMO | Admitting: Family Medicine

## 2019-02-19 NOTE — Progress Notes (Signed)
This encounter was created in error - please disregard.

## 2019-02-23 DIAGNOSIS — M545 Low back pain: Secondary | ICD-10-CM | POA: Diagnosis not present

## 2019-02-23 DIAGNOSIS — M79604 Pain in right leg: Secondary | ICD-10-CM | POA: Diagnosis not present

## 2019-02-23 DIAGNOSIS — M25512 Pain in left shoulder: Secondary | ICD-10-CM | POA: Diagnosis not present

## 2019-02-25 DIAGNOSIS — M25512 Pain in left shoulder: Secondary | ICD-10-CM | POA: Diagnosis not present

## 2019-02-25 DIAGNOSIS — M79604 Pain in right leg: Secondary | ICD-10-CM | POA: Diagnosis not present

## 2019-02-25 DIAGNOSIS — M545 Low back pain: Secondary | ICD-10-CM | POA: Diagnosis not present

## 2019-03-02 ENCOUNTER — Other Ambulatory Visit: Payer: Self-pay | Admitting: Family Medicine

## 2019-03-02 DIAGNOSIS — L308 Other specified dermatitis: Secondary | ICD-10-CM | POA: Diagnosis not present

## 2019-03-02 DIAGNOSIS — L299 Pruritus, unspecified: Secondary | ICD-10-CM | POA: Diagnosis not present

## 2019-03-02 DIAGNOSIS — F411 Generalized anxiety disorder: Secondary | ICD-10-CM

## 2019-03-02 DIAGNOSIS — D472 Monoclonal gammopathy: Secondary | ICD-10-CM | POA: Diagnosis not present

## 2019-03-02 DIAGNOSIS — R7989 Other specified abnormal findings of blood chemistry: Secondary | ICD-10-CM | POA: Diagnosis not present

## 2019-03-02 DIAGNOSIS — Z79899 Other long term (current) drug therapy: Secondary | ICD-10-CM | POA: Diagnosis not present

## 2019-03-04 DIAGNOSIS — G894 Chronic pain syndrome: Secondary | ICD-10-CM | POA: Diagnosis not present

## 2019-03-04 DIAGNOSIS — K5903 Drug induced constipation: Secondary | ICD-10-CM | POA: Diagnosis not present

## 2019-03-04 DIAGNOSIS — M79604 Pain in right leg: Secondary | ICD-10-CM | POA: Diagnosis not present

## 2019-03-04 DIAGNOSIS — M25512 Pain in left shoulder: Secondary | ICD-10-CM | POA: Diagnosis not present

## 2019-03-04 DIAGNOSIS — M545 Low back pain: Secondary | ICD-10-CM | POA: Diagnosis not present

## 2019-03-04 DIAGNOSIS — Z79891 Long term (current) use of opiate analgesic: Secondary | ICD-10-CM | POA: Diagnosis not present

## 2019-04-01 ENCOUNTER — Other Ambulatory Visit: Payer: Self-pay | Admitting: Family Medicine

## 2019-04-01 DIAGNOSIS — K5903 Drug induced constipation: Secondary | ICD-10-CM | POA: Diagnosis not present

## 2019-04-01 DIAGNOSIS — M25512 Pain in left shoulder: Secondary | ICD-10-CM | POA: Diagnosis not present

## 2019-04-01 DIAGNOSIS — F411 Generalized anxiety disorder: Secondary | ICD-10-CM

## 2019-04-01 DIAGNOSIS — M545 Low back pain: Secondary | ICD-10-CM | POA: Diagnosis not present

## 2019-04-01 DIAGNOSIS — G894 Chronic pain syndrome: Secondary | ICD-10-CM | POA: Diagnosis not present

## 2019-04-01 DIAGNOSIS — Z79891 Long term (current) use of opiate analgesic: Secondary | ICD-10-CM | POA: Diagnosis not present

## 2019-04-20 DIAGNOSIS — L299 Pruritus, unspecified: Secondary | ICD-10-CM | POA: Diagnosis not present

## 2019-04-20 DIAGNOSIS — L308 Other specified dermatitis: Secondary | ICD-10-CM | POA: Diagnosis not present

## 2019-04-20 DIAGNOSIS — Z79899 Other long term (current) drug therapy: Secondary | ICD-10-CM | POA: Diagnosis not present

## 2019-04-22 ENCOUNTER — Other Ambulatory Visit: Payer: Self-pay

## 2019-04-22 ENCOUNTER — Ambulatory Visit
Admission: RE | Admit: 2019-04-22 | Discharge: 2019-04-22 | Disposition: A | Discharge status: Home / Self Care | Payer: Medicare HMO | Source: Ambulatory Visit | Attending: Family Medicine | Admitting: Family Medicine

## 2019-04-22 DIAGNOSIS — Z780 Asymptomatic menopausal state: Secondary | ICD-10-CM | POA: Diagnosis not present

## 2019-04-22 DIAGNOSIS — M8000XA Age-related osteoporosis with current pathological fracture, unspecified site, initial encounter for fracture: Secondary | ICD-10-CM

## 2019-04-25 ENCOUNTER — Other Ambulatory Visit: Payer: Self-pay | Admitting: Family Medicine

## 2019-04-25 DIAGNOSIS — E119 Type 2 diabetes mellitus without complications: Secondary | ICD-10-CM

## 2019-05-04 DIAGNOSIS — Z79891 Long term (current) use of opiate analgesic: Secondary | ICD-10-CM | POA: Diagnosis not present

## 2019-05-04 DIAGNOSIS — M25559 Pain in unspecified hip: Secondary | ICD-10-CM | POA: Diagnosis not present

## 2019-05-04 DIAGNOSIS — K5903 Drug induced constipation: Secondary | ICD-10-CM | POA: Diagnosis not present

## 2019-05-04 DIAGNOSIS — M25512 Pain in left shoulder: Secondary | ICD-10-CM | POA: Diagnosis not present

## 2019-05-04 DIAGNOSIS — M545 Low back pain: Secondary | ICD-10-CM | POA: Diagnosis not present

## 2019-05-04 DIAGNOSIS — G894 Chronic pain syndrome: Secondary | ICD-10-CM | POA: Diagnosis not present

## 2019-05-11 ENCOUNTER — Other Ambulatory Visit: Payer: Self-pay | Admitting: Family Medicine

## 2019-05-11 DIAGNOSIS — F411 Generalized anxiety disorder: Secondary | ICD-10-CM

## 2019-05-12 MED ORDER — ALPRAZOLAM 0.5 MG PO TBDP: 0.5000 mg | ORAL_TABLET | Freq: Every evening | ORAL | 0 refills | Status: DC | PRN

## 2019-05-12 NOTE — Telephone Encounter (Signed)
We have been weaning alprazolam due to high oxycodone use and concern of OD risk.  Last wean dose was 18 1mg  tabs prn over 30 days.  Now reducing to 30  0.5mg  prn doses over 30 days.  -Dr. Criss Rosales

## 2019-05-14 NOTE — Telephone Encounter (Addendum)
Patient calls nurse line stating she went to pick up her xanax and was told she would need to pay ~90 dollars. Patient stated upon review of medication, a dissolvable form was called in, which is why her insurance is not covering. Patient was not made aware of change, and does not want a dissolvable form. Please advise if this was an oversight. The patient left the medication at the pharmacy.

## 2019-05-16 MED ORDER — ALPRAZOLAM 0.5 MG PO TABS: 0.5000 mg | ORAL_TABLET | Freq: Every evening | ORAL | 0 refills | Status: DC | PRN

## 2019-05-16 NOTE — Telephone Encounter (Signed)
dissovlable ordered by mistake, new order placed, called pharmacy to confim cancellation of dissolving and receipt of new rx  -

## 2019-05-16 NOTE — Addendum Note (Signed)
Addended by: Sherene Sires R on: 05/16/2019 11:00 AM   Modules accepted: Orders

## 2019-05-19 DIAGNOSIS — I728 Aneurysm of other specified arteries: Secondary | ICD-10-CM | POA: Diagnosis not present

## 2019-05-19 DIAGNOSIS — R935 Abnormal findings on diagnostic imaging of other abdominal regions, including retroperitoneum: Secondary | ICD-10-CM | POA: Diagnosis not present

## 2019-05-19 DIAGNOSIS — I708 Atherosclerosis of other arteries: Secondary | ICD-10-CM | POA: Diagnosis not present

## 2019-05-28 DIAGNOSIS — I728 Aneurysm of other specified arteries: Secondary | ICD-10-CM | POA: Diagnosis not present

## 2019-05-29 DIAGNOSIS — K7460 Unspecified cirrhosis of liver: Secondary | ICD-10-CM | POA: Diagnosis not present

## 2019-05-29 DIAGNOSIS — Z1211 Encounter for screening for malignant neoplasm of colon: Secondary | ICD-10-CM | POA: Diagnosis not present

## 2019-05-29 DIAGNOSIS — R110 Nausea: Secondary | ICD-10-CM | POA: Diagnosis not present

## 2019-05-29 DIAGNOSIS — B1920 Unspecified viral hepatitis C without hepatic coma: Secondary | ICD-10-CM | POA: Diagnosis not present

## 2019-06-01 DIAGNOSIS — M545 Low back pain: Secondary | ICD-10-CM | POA: Diagnosis not present

## 2019-06-01 DIAGNOSIS — G894 Chronic pain syndrome: Secondary | ICD-10-CM | POA: Diagnosis not present

## 2019-06-01 DIAGNOSIS — K5903 Drug induced constipation: Secondary | ICD-10-CM | POA: Diagnosis not present

## 2019-06-01 DIAGNOSIS — Z79891 Long term (current) use of opiate analgesic: Secondary | ICD-10-CM | POA: Diagnosis not present

## 2019-06-01 DIAGNOSIS — M25512 Pain in left shoulder: Secondary | ICD-10-CM | POA: Diagnosis not present

## 2019-06-11 DIAGNOSIS — Z7984 Long term (current) use of oral hypoglycemic drugs: Secondary | ICD-10-CM | POA: Diagnosis not present

## 2019-06-11 DIAGNOSIS — Z6841 Body Mass Index (BMI) 40.0 and over, adult: Secondary | ICD-10-CM | POA: Diagnosis not present

## 2019-06-11 DIAGNOSIS — F331 Major depressive disorder, recurrent, moderate: Secondary | ICD-10-CM | POA: Diagnosis not present

## 2019-06-11 DIAGNOSIS — L299 Pruritus, unspecified: Secondary | ICD-10-CM | POA: Diagnosis not present

## 2019-06-11 DIAGNOSIS — G8929 Other chronic pain: Secondary | ICD-10-CM | POA: Diagnosis not present

## 2019-06-11 DIAGNOSIS — E119 Type 2 diabetes mellitus without complications: Secondary | ICD-10-CM | POA: Diagnosis not present

## 2019-06-11 DIAGNOSIS — Z87891 Personal history of nicotine dependence: Secondary | ICD-10-CM | POA: Diagnosis not present

## 2019-06-11 DIAGNOSIS — R21 Rash and other nonspecific skin eruption: Secondary | ICD-10-CM | POA: Diagnosis not present

## 2019-06-11 DIAGNOSIS — Z79899 Other long term (current) drug therapy: Secondary | ICD-10-CM | POA: Diagnosis not present

## 2019-06-11 DIAGNOSIS — J4520 Mild intermittent asthma, uncomplicated: Secondary | ICD-10-CM | POA: Diagnosis not present

## 2019-06-25 DIAGNOSIS — E1137X9 Type 2 diabetes mellitus with diabetic macular edema, resolved following treatment, unspecified eye: Secondary | ICD-10-CM | POA: Diagnosis not present

## 2019-06-25 DIAGNOSIS — Z79899 Other long term (current) drug therapy: Secondary | ICD-10-CM | POA: Diagnosis not present

## 2019-06-25 DIAGNOSIS — G4700 Insomnia, unspecified: Secondary | ICD-10-CM | POA: Diagnosis not present

## 2019-06-25 DIAGNOSIS — E661 Drug-induced obesity: Secondary | ICD-10-CM | POA: Diagnosis not present

## 2019-06-25 DIAGNOSIS — F419 Anxiety disorder, unspecified: Secondary | ICD-10-CM | POA: Diagnosis not present

## 2019-06-25 DIAGNOSIS — F4310 Post-traumatic stress disorder, unspecified: Secondary | ICD-10-CM | POA: Diagnosis not present

## 2019-07-03 ENCOUNTER — Other Ambulatory Visit: Payer: Self-pay | Admitting: Family Medicine

## 2019-07-03 DIAGNOSIS — F411 Generalized anxiety disorder: Secondary | ICD-10-CM

## 2019-07-09 DIAGNOSIS — M545 Low back pain: Secondary | ICD-10-CM | POA: Diagnosis not present

## 2019-07-09 DIAGNOSIS — Z79899 Other long term (current) drug therapy: Secondary | ICD-10-CM | POA: Diagnosis not present

## 2019-07-09 DIAGNOSIS — G894 Chronic pain syndrome: Secondary | ICD-10-CM | POA: Diagnosis not present

## 2019-07-22 ENCOUNTER — Other Ambulatory Visit: Payer: Self-pay | Admitting: Family Medicine

## 2019-07-22 DIAGNOSIS — E119 Type 2 diabetes mellitus without complications: Secondary | ICD-10-CM

## 2019-07-30 ENCOUNTER — Other Ambulatory Visit: Payer: Self-pay | Admitting: Family Medicine

## 2019-08-03 DIAGNOSIS — L308 Other specified dermatitis: Secondary | ICD-10-CM | POA: Diagnosis not present

## 2019-08-03 DIAGNOSIS — Z79899 Other long term (current) drug therapy: Secondary | ICD-10-CM | POA: Diagnosis not present

## 2019-08-03 DIAGNOSIS — L299 Pruritus, unspecified: Secondary | ICD-10-CM | POA: Diagnosis not present

## 2019-08-06 ENCOUNTER — Other Ambulatory Visit: Payer: Self-pay

## 2019-08-06 DIAGNOSIS — F411 Generalized anxiety disorder: Secondary | ICD-10-CM

## 2019-08-06 MED ORDER — ALPRAZOLAM 0.5 MG PO TABS: ORAL_TABLET | ORAL | 0 refills | Status: DC

## 2019-08-07 ENCOUNTER — Telehealth: Payer: Self-pay | Admitting: Family Medicine

## 2019-08-07 NOTE — Telephone Encounter (Signed)
Dr. Annie Sable, is leaving a message for patients doctor. Patient can restart HTCZ, trail of Lisinopril for 3 months, if doctor wants to speak to her she can be contacted on her cell phone at [phone] or E-mail [email or web address].. Thanks

## 2019-08-10 DIAGNOSIS — M545 Low back pain: Secondary | ICD-10-CM | POA: Diagnosis not present

## 2019-08-10 DIAGNOSIS — G894 Chronic pain syndrome: Secondary | ICD-10-CM | POA: Diagnosis not present

## 2019-08-10 DIAGNOSIS — Z79899 Other long term (current) drug therapy: Secondary | ICD-10-CM | POA: Diagnosis not present

## 2019-08-14 ENCOUNTER — Encounter: Payer: Self-pay | Admitting: Family Medicine

## 2019-08-14 ENCOUNTER — Ambulatory Visit (INDEPENDENT_AMBULATORY_CARE_PROVIDER_SITE_OTHER): Payer: Medicare HMO | Admitting: Family Medicine

## 2019-08-14 ENCOUNTER — Other Ambulatory Visit: Payer: Self-pay

## 2019-08-14 VITALS — BP 140/82 | HR 98 | Ht 65.0 in | Wt 218.0 lb

## 2019-08-14 DIAGNOSIS — E119 Type 2 diabetes mellitus without complications: Secondary | ICD-10-CM | POA: Diagnosis not present

## 2019-08-14 DIAGNOSIS — F5101 Primary insomnia: Secondary | ICD-10-CM

## 2019-08-14 LAB — POCT GLYCOSYLATED HEMOGLOBIN (HGB A1C): HbA1c, POC (controlled diabetic range): 6.8 % (ref 0.0–7.0)

## 2019-08-14 NOTE — Progress Notes (Signed)
    SUBJECTIVE:   CHIEF COMPLAINT / HPI:   Charlyn Vialpando is a 64 yr old female who presents today for diabetes follow up   Diabetes Takes Metformin XR 500mg  once daily and Jardiance 10mg  once daily. Denies side effects. Denies polyuria, polydipsia or blurred vision Does have some abdominal pains after eatings sometimes but put this down to her cholecystectomy. She usually checks her BGs in the morning and they usually 119-120. Denies hypoglycemic symptoms. Reports healthy diet without much fast/processed foods.  Sleep  Used to take Ambien 0.5mg  which was stopped 6 months ago. She is unhappy with previous provider because he took her off it. Her poor sleep is related to stress. She has had this since her daughter passed away in 04/26/07.  Sleeps at 9pm and does not fall asleep until 1-2am. She has both trouble falling asleep and staying asleep. Suffers bad headaches and 'feels terrible" in the morning. Does not nap during the day. Has been seeing a long term therapist twice a month. Has tried sleep aid such as melatonin, ramelteon which did not help at all. Wants to go back on Ambien.  PERTINENT  PMH / PSH: Diabetes, HTN, Hepatitis  C, GERD, Psoriasis   OBJECTIVE:   BP 140/82   Pulse 98   Ht 5\' 5"  (1.651 m)   Wt 218 lb (98.9 kg)   SpO2 98%   BMI 36.28 kg/m    General: Alert, no acute distress Cardio: Normal S1 and S2, RRR Pulm: CTAB, normal WOB Abdomen: Bowel sounds normal. Abdomen soft and non-tender.  Extremities: No peripheral edema. Warm/ well perfused.  Strong radial pulse Neuro: Cranial nerves grossly intact  ASSESSMENT/PLAN:   Diabetes mellitus without complication (HCC) P2A 6.8. Diabetes at goal. Continue Metformin and Jardiance. Follow up in 3 months.  Insomnia Chronic insomnia likely 2/2 stress and anxiety. She has tried melatonin which does not work and ramelteon which gives her nightmares. She was successfully weaned off Ambien earlier this year in January but not  wants to go back on it. It is my first time meeting Ms Sandefur today and given her medical complex history, polypharmacy and taking multiple psych meds: xanax, abiligy and zoloft, I was hesitant to start Ambien today. Would like to do thorough meds review with patient at next visit before starting a sleep aid particularly a second benzo. Explained to this to patient who is happy with this plan. Follow up with me on 20th August.     Eashan Schipani, MD [place]

## 2019-08-14 NOTE — Telephone Encounter (Signed)
-----   Message from Lattie Haw, MD sent at 08/07/2019  3:20 PM EDT ----- Regarding: BP follow up Hi team,  Please could you arrange this patient to be seen for a BP check with me. (The dermatologist has been taken her off some of her BP meds per her dermatologist to find out the cause of her dermatitis).  Thank you Poonam

## 2019-08-14 NOTE — Patient Instructions (Signed)
It was great to see you today. Your diabetes is very well controlled. You are doing a great job, keep up the great work! Continue jardiance and metformin.   I am sorry you are experiencing trouble sleeping. Lets meet again in the next 1-2 weeks and discuss starting medication then. We will also review all your medications then. In the meantime you can try melatonin 5-6mg  at home.  Best wishes Dr Posey Pronto

## 2019-08-14 NOTE — Telephone Encounter (Signed)
Pt in clinic today seeing Dr. Posey Pronto. Ottis Stain, CMA

## 2019-08-16 NOTE — Assessment & Plan Note (Addendum)
Chronic insomnia likely 2/2 stress and anxiety. She has tried melatonin which does not work and ramelteon which gives her nightmares. She was successfully weaned off Ambien earlier this year in January but not wants to go back on it. It is my first time meeting Stacy Campbell today and given her medical complex history, polypharmacy and taking multiple psych meds: xanax, abiligy and zoloft, I was hesitant to start Ambien today. Would like to do thorough meds review with patient at next visit before starting a sleep aid particularly a second benzo. Explained to this to patient who is happy with this plan. Follow up with me on 20th August.

## 2019-08-16 NOTE — Assessment & Plan Note (Signed)
A1c 6.8. Diabetes at goal. Continue Metformin and Jardiance. Follow up in 3 months.

## 2019-08-19 DIAGNOSIS — M545 Low back pain: Secondary | ICD-10-CM | POA: Diagnosis not present

## 2019-08-28 ENCOUNTER — Telehealth: Payer: Self-pay

## 2019-08-28 ENCOUNTER — Other Ambulatory Visit: Payer: Self-pay

## 2019-08-28 ENCOUNTER — Encounter: Payer: Self-pay | Admitting: Family Medicine

## 2019-08-28 ENCOUNTER — Ambulatory Visit (INDEPENDENT_AMBULATORY_CARE_PROVIDER_SITE_OTHER): Payer: Medicare HMO | Admitting: Family Medicine

## 2019-08-28 VITALS — BP 136/80 | HR 83 | Ht 65.0 in | Wt 214.0 lb

## 2019-08-28 DIAGNOSIS — F339 Major depressive disorder, recurrent, unspecified: Secondary | ICD-10-CM | POA: Diagnosis not present

## 2019-08-28 MED ORDER — BENZONATATE 100 MG PO CAPS: 100.0000 mg | ORAL_CAPSULE | Freq: Three times a day (TID) | ORAL | 0 refills | Status: DC | PRN

## 2019-08-28 MED ORDER — MECLIZINE HCL 25 MG PO TABS: 25.0000 mg | ORAL_TABLET | Freq: Three times a day (TID) | ORAL | 0 refills | Status: DC | PRN

## 2019-08-28 NOTE — Patient Instructions (Signed)
Great to see you today. I still dont think your depression and anxiety are uncontrol and think you should see a psychiatrist. I have given you a info sheet with a list of psychiatrist and aslso referred you to them. Please call them in the mean time and schedule an appointment. Please also speak to them about your sleep troubles too.  Please follow up with me.  Best wishes and take care,  Dr Posey Pronto

## 2019-08-28 NOTE — Progress Notes (Signed)
° ° °  SUBJECTIVE:   CHIEF COMPLAINT / HPI:   Ms Kliewer is a 64 yr old female who presents today for medication review and chronic insomnia   Chronic insomnia Endorses chronic insomnia since lost of daughter in 2019. She has trouble falling asleep and staying asleep. She falls asleep at 2am and sleeps for around 30 mins. She now looks after her grandchildren daily and finds it very hard ot manage on poor sleep. She sees Dr Kane-psychologist for therapy. She has tried melatonin, ramelteon in the past. The only thing which has helped is Ambien. She is unsure what to request as she knows benzos are not a favorable medication long term.  Medication review Reviewed medications together with patient today. She did not bring her medications with her today.  PERTINENT  PMH / PSH: Anxiety, depression   OBJECTIVE:   BP 136/80    Pulse 83    Ht 5\' 5"  (1.651 m)    Wt 214 lb (97.1 kg)    SpO2 99%    BMI 35.61 kg/m    General: Alert, no acute distress, appears stated age  Cardio: Normal S1 and S2, RRR Pulm: Normal respiratory effort Extremities: No peripheral edema. Warm/ well perfused.   Neuro: Cranial nerves grossly intact  ASSESSMENT/PLAN:   Depression, recurrent (HCC) Pt has on going depressive symptoms, PHQ 9 7 today. No active or passive SI. It is likely that her chronic insomnia is related to her depression. Per patient and chart review pt is not seeing a psychiatrist but rather a psychologist for therapy-Dr Gwenlyn Saran who used to work at [facility]. She is on Alprazolam, Aripiprazole and Zoloft all of which have been initially prescribed by [facility]. Recommended that before starting a benzo we refer pt to psychiatry in order to have this managed better and optimize the medications she is taking. Pt is happy with this plan. Could consider neurology referral in the future if pt has persistent insomnia despite treatment of her depression.     Lattie Haw, MD PGY2 [place]

## 2019-09-02 NOTE — Assessment & Plan Note (Signed)
Pt has on going depressive symptoms, PHQ 9 7 today. No active or passive SI. It is likely that her chronic insomnia is related to her depression. Per patient and chart review pt is not seeing a psychiatrist but rather a psychologist for therapy-Dr Gwenlyn Saran who used to work at [facility]. She is on Alprazolam, Aripiprazole and Zoloft all of which have been initially prescribed by [facility]. Recommended that before starting a benzo we refer pt to psychiatry in order to have this managed better and optimize the medications she is taking. Pt is happy with this plan. Could consider neurology referral in the future if pt has persistent insomnia despite treatment of her depression.

## 2019-09-04 ENCOUNTER — Encounter: Payer: Self-pay | Admitting: Family Medicine

## 2019-09-08 ENCOUNTER — Telehealth: Payer: Self-pay | Admitting: Family Medicine

## 2019-09-08 NOTE — Telephone Encounter (Signed)
Called patient following her mychart message expressing she was confused why she was referred to psychiatry. I explained that she was referred as she has been on anxiety and depression medications for many years and her symptoms are still persistent and that she would benefit from a psychiatry evaluation. Pt explained that she does not want to speak to anyone again regarding her daughter's death many years ago. I explained that this would be her choice and she could speak to the psychiatrist about this when she sees them. I explained that depression and anxiety are not managed by medications alone and that therapy works in conjunction to it. I also explained other therapies which are available for treatment resistant depression such as ECT which the psychiatrist may explore. Pt expressed understanding.

## 2019-09-09 DIAGNOSIS — M545 Low back pain: Secondary | ICD-10-CM | POA: Diagnosis not present

## 2019-09-09 DIAGNOSIS — G8929 Other chronic pain: Secondary | ICD-10-CM | POA: Diagnosis not present

## 2019-09-09 DIAGNOSIS — Z79899 Other long term (current) drug therapy: Secondary | ICD-10-CM | POA: Diagnosis not present

## 2019-09-09 DIAGNOSIS — G894 Chronic pain syndrome: Secondary | ICD-10-CM | POA: Diagnosis not present

## 2019-09-11 NOTE — Telephone Encounter (Signed)
error 

## 2019-09-30 ENCOUNTER — Ambulatory Visit (INDEPENDENT_AMBULATORY_CARE_PROVIDER_SITE_OTHER): Payer: Medicare HMO

## 2019-09-30 ENCOUNTER — Other Ambulatory Visit: Payer: Self-pay

## 2019-09-30 DIAGNOSIS — Z23 Encounter for immunization: Secondary | ICD-10-CM | POA: Diagnosis not present

## 2019-09-30 NOTE — Progress Notes (Signed)
Patient presents to nurse clinic for yearly flu vaccination. Patient denies previous allergic reaction to flu vaccine or any components to the vaccine. Administered in right deltoid, site unremarkable, tolerated injection well.   Provided patient with updated immunization record.   Talbot Grumbling, RN

## 2019-10-02 ENCOUNTER — Other Ambulatory Visit: Payer: Self-pay | Admitting: Gastroenterology

## 2019-10-02 DIAGNOSIS — K7460 Unspecified cirrhosis of liver: Secondary | ICD-10-CM | POA: Diagnosis not present

## 2019-10-02 DIAGNOSIS — R110 Nausea: Secondary | ICD-10-CM | POA: Diagnosis not present

## 2019-10-02 DIAGNOSIS — Z1211 Encounter for screening for malignant neoplasm of colon: Secondary | ICD-10-CM | POA: Diagnosis not present

## 2019-10-06 ENCOUNTER — Ambulatory Visit
Admission: RE | Admit: 2019-10-06 | Discharge: 2019-10-06 | Disposition: A | Discharge status: Home / Self Care | Payer: Medicare HMO | Source: Ambulatory Visit | Attending: Gastroenterology | Admitting: Gastroenterology

## 2019-10-06 DIAGNOSIS — K7460 Unspecified cirrhosis of liver: Secondary | ICD-10-CM | POA: Diagnosis not present

## 2019-10-08 ENCOUNTER — Telehealth: Payer: Self-pay

## 2019-10-08 NOTE — Telephone Encounter (Signed)
-----   Message from Lattie Haw, MD sent at 10/07/2019  4:50 PM EDT ----- Regarding: leaving [facility] Hi team (best team!!) This patient has an upcoming appointment with another family med practice in December and I believe she is leaving [place]. Please could you confirm this with the patient?  Thank you Poonam

## 2019-10-08 NOTE — Telephone Encounter (Signed)
LVM for a return call to verify if she is changing PCP practices. Ottis Stain, CMA

## 2019-10-09 DIAGNOSIS — G894 Chronic pain syndrome: Secondary | ICD-10-CM | POA: Diagnosis not present

## 2019-10-09 DIAGNOSIS — Z79899 Other long term (current) drug therapy: Secondary | ICD-10-CM | POA: Diagnosis not present

## 2019-10-13 NOTE — Telephone Encounter (Signed)
LVM asking for a return call to our office. If pt calls, please verify she is changing pcp practices. Ottis Stain, CMA

## 2019-10-15 ENCOUNTER — Other Ambulatory Visit: Payer: Self-pay | Admitting: Family Medicine

## 2019-10-15 ENCOUNTER — Telehealth: Payer: Self-pay | Admitting: Family Medicine

## 2019-10-15 DIAGNOSIS — E119 Type 2 diabetes mellitus without complications: Secondary | ICD-10-CM

## 2019-10-15 NOTE — Telephone Encounter (Signed)
Pt called in returning call left on her voicemail. Please call pt back to discuss leaving this practice and to make sure she has enough refills on file before switching to new provider in December. Best contact # [phone]

## 2019-10-16 NOTE — Telephone Encounter (Signed)
Pt returned call, see message from 10/15/2019. Ottis Stain, CMA

## 2019-10-24 ENCOUNTER — Other Ambulatory Visit: Payer: Self-pay | Admitting: Family Medicine

## 2019-10-24 DIAGNOSIS — E119 Type 2 diabetes mellitus without complications: Secondary | ICD-10-CM

## 2019-11-05 DIAGNOSIS — G8929 Other chronic pain: Secondary | ICD-10-CM | POA: Diagnosis not present

## 2019-11-05 DIAGNOSIS — Z79899 Other long term (current) drug therapy: Secondary | ICD-10-CM | POA: Diagnosis not present

## 2019-11-05 DIAGNOSIS — M545 Low back pain, unspecified: Secondary | ICD-10-CM | POA: Diagnosis not present

## 2019-11-05 DIAGNOSIS — Z6837 Body mass index (BMI) 37.0-37.9, adult: Secondary | ICD-10-CM | POA: Diagnosis not present

## 2019-11-09 DIAGNOSIS — L304 Erythema intertrigo: Secondary | ICD-10-CM | POA: Diagnosis not present

## 2019-11-09 DIAGNOSIS — L299 Pruritus, unspecified: Secondary | ICD-10-CM | POA: Diagnosis not present

## 2019-11-09 DIAGNOSIS — L308 Other specified dermatitis: Secondary | ICD-10-CM | POA: Diagnosis not present

## 2019-11-09 DIAGNOSIS — Z79899 Other long term (current) drug therapy: Secondary | ICD-10-CM | POA: Diagnosis not present

## 2019-11-09 DIAGNOSIS — R945 Abnormal results of liver function studies: Secondary | ICD-10-CM | POA: Diagnosis not present

## 2019-11-23 DIAGNOSIS — Z1159 Encounter for screening for other viral diseases: Secondary | ICD-10-CM | POA: Diagnosis not present

## 2019-11-25 DIAGNOSIS — F332 Major depressive disorder, recurrent severe without psychotic features: Secondary | ICD-10-CM | POA: Diagnosis not present

## 2019-11-25 DIAGNOSIS — F4312 Post-traumatic stress disorder, chronic: Secondary | ICD-10-CM | POA: Diagnosis not present

## 2019-11-25 DIAGNOSIS — F411 Generalized anxiety disorder: Secondary | ICD-10-CM | POA: Diagnosis not present

## 2019-11-25 DIAGNOSIS — Z79891 Long term (current) use of opiate analgesic: Secondary | ICD-10-CM | POA: Diagnosis not present

## 2019-11-26 DIAGNOSIS — B9681 Helicobacter pylori [H. pylori] as the cause of diseases classified elsewhere: Secondary | ICD-10-CM | POA: Diagnosis not present

## 2019-11-26 DIAGNOSIS — R110 Nausea: Secondary | ICD-10-CM | POA: Diagnosis not present

## 2019-11-26 DIAGNOSIS — Z1211 Encounter for screening for malignant neoplasm of colon: Secondary | ICD-10-CM | POA: Diagnosis not present

## 2019-11-26 DIAGNOSIS — K2930 Chronic superficial gastritis without bleeding: Secondary | ICD-10-CM | POA: Diagnosis not present

## 2019-12-02 ENCOUNTER — Other Ambulatory Visit: Payer: Self-pay

## 2019-12-02 DIAGNOSIS — K2930 Chronic superficial gastritis without bleeding: Secondary | ICD-10-CM | POA: Diagnosis not present

## 2019-12-02 DIAGNOSIS — B9681 Helicobacter pylori [H. pylori] as the cause of diseases classified elsewhere: Secondary | ICD-10-CM | POA: Diagnosis not present

## 2019-12-02 DIAGNOSIS — E119 Type 2 diabetes mellitus without complications: Secondary | ICD-10-CM

## 2019-12-03 MED ORDER — METFORMIN HCL ER 500 MG PO TB24: ORAL_TABLET | ORAL | 3 refills | Status: DC

## 2019-12-08 DIAGNOSIS — Z6837 Body mass index (BMI) 37.0-37.9, adult: Secondary | ICD-10-CM | POA: Diagnosis not present

## 2019-12-08 DIAGNOSIS — M545 Low back pain, unspecified: Secondary | ICD-10-CM | POA: Diagnosis not present

## 2019-12-08 DIAGNOSIS — G8929 Other chronic pain: Secondary | ICD-10-CM | POA: Diagnosis not present

## 2019-12-08 DIAGNOSIS — Z79899 Other long term (current) drug therapy: Secondary | ICD-10-CM | POA: Diagnosis not present

## 2019-12-19 IMAGING — US US ABDOMEN LIMITED
1 series · 14 of 25 positions shown · non-contrast
Comparison: CT abdomen pelvis dated April 29, 2017.

CLINICAL DATA: Cirrhosis.

EXAM:
ULTRASOUND ABDOMEN LIMITED RIGHT UPPER QUADRANT

[Series 1: us abdomen limited · 0.23mm/px · 14 of 35 slices shown]
[im 1/35]
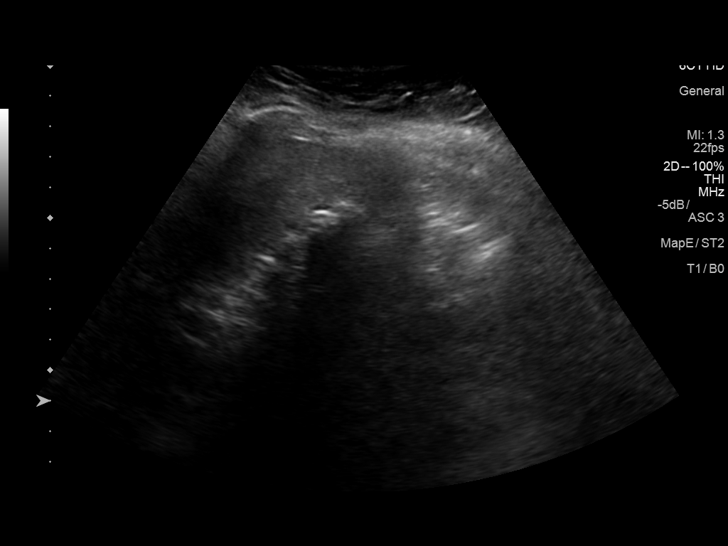
[im 3/35]
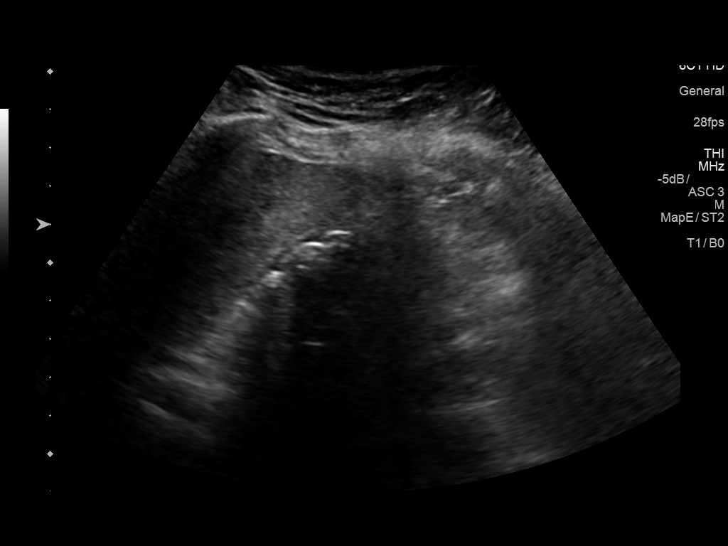
[im 6/35]
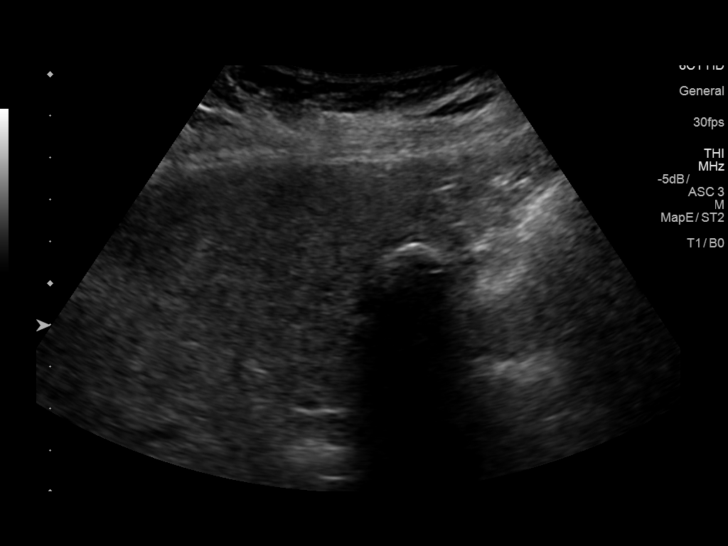
[im 9/35]
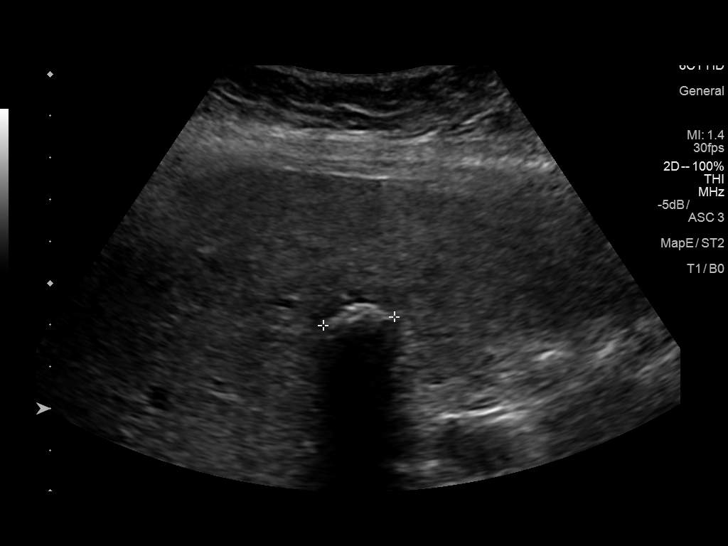
[im 12/35]
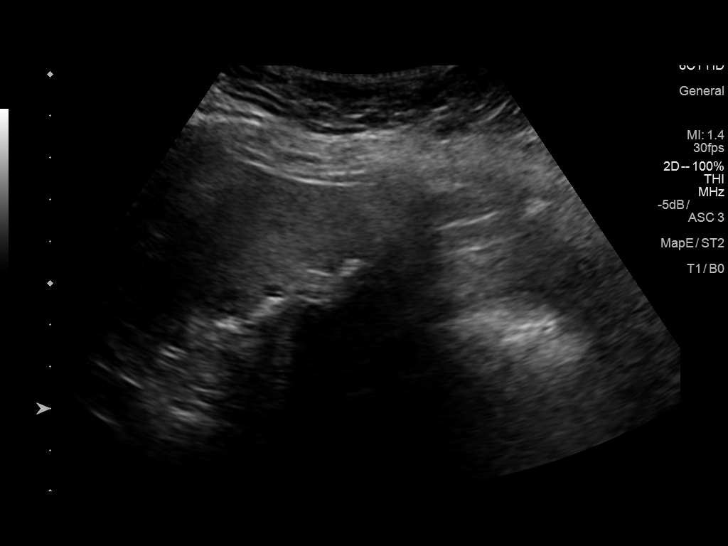
[im 13/35]
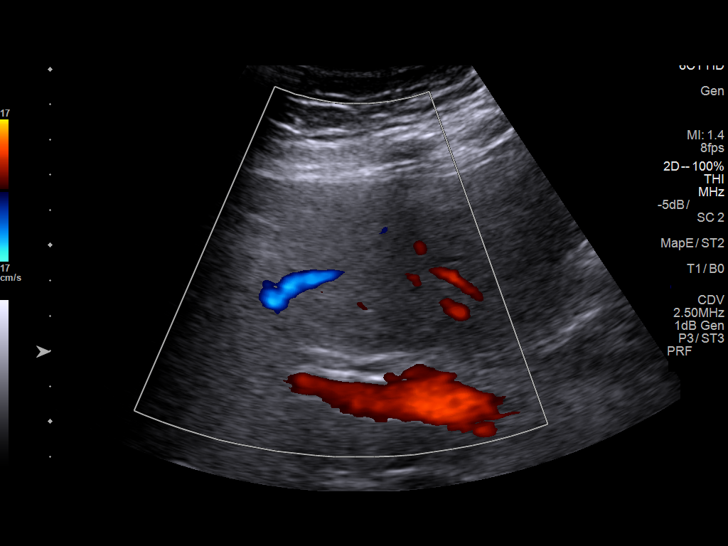
[im 16/35]
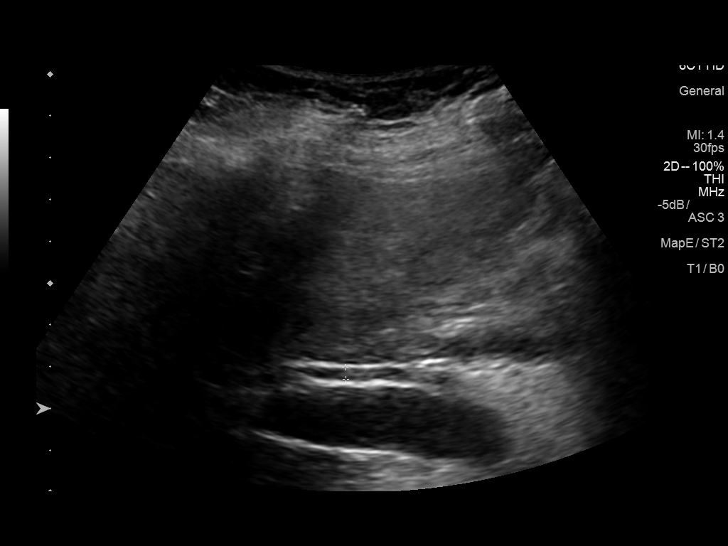
[im 19/35]
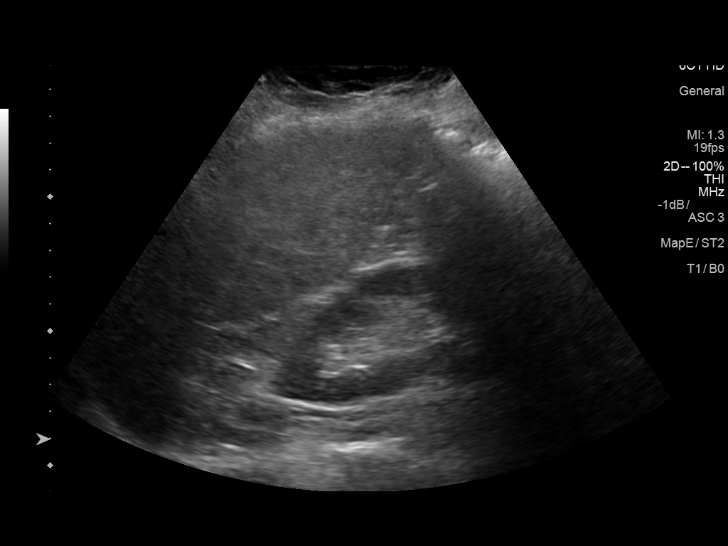
[im 22/35]
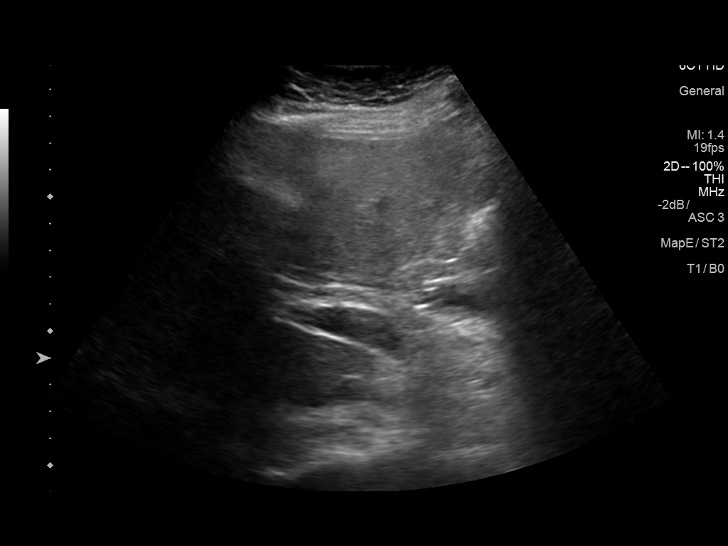
[im 23/35]
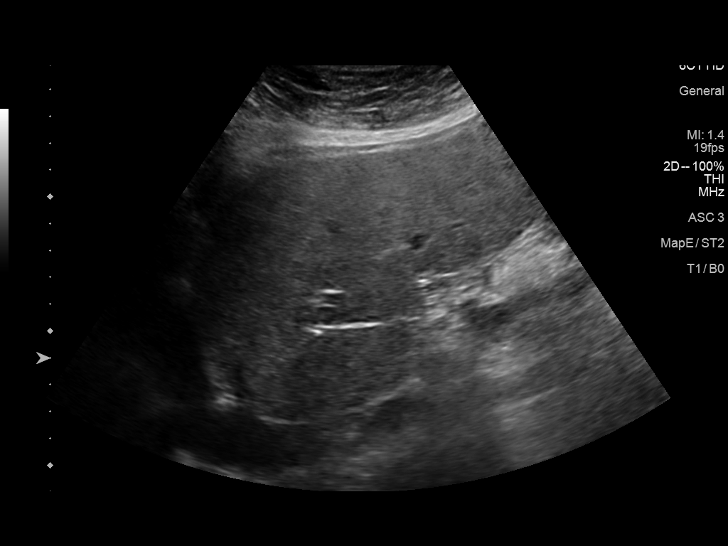
[im 26/35]
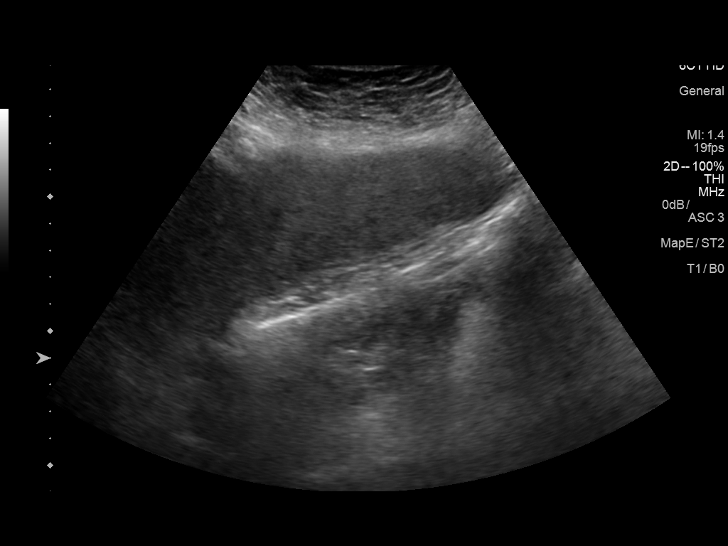
[im 29/35]
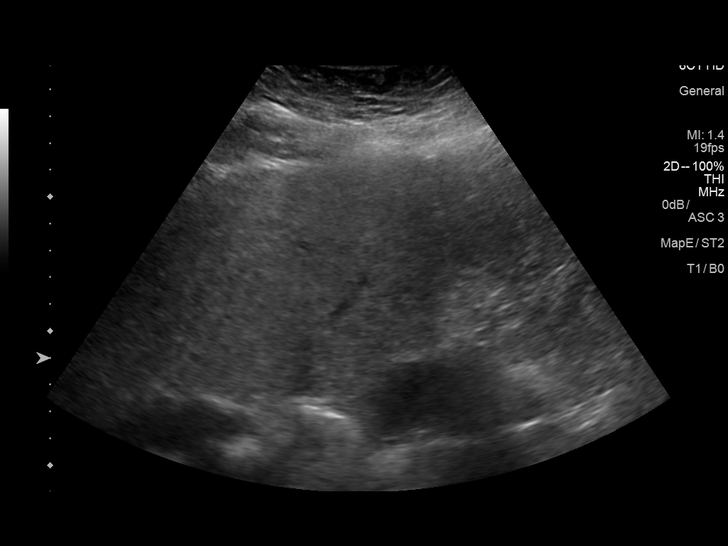
[im 32/35]
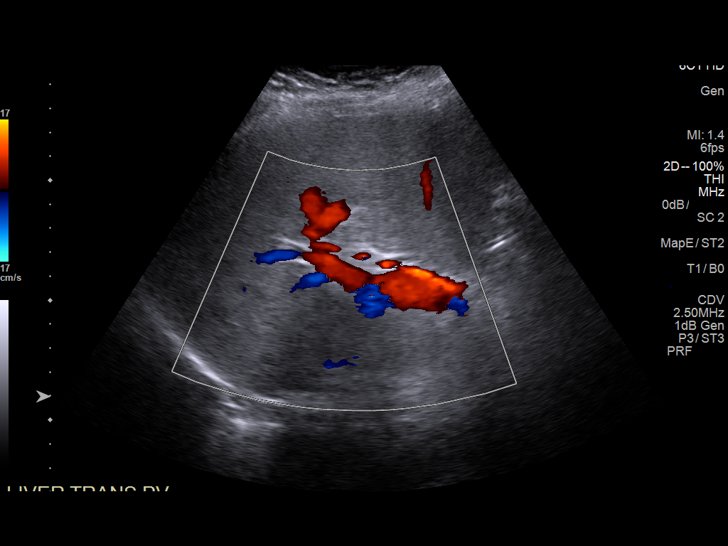
[im 35/35]
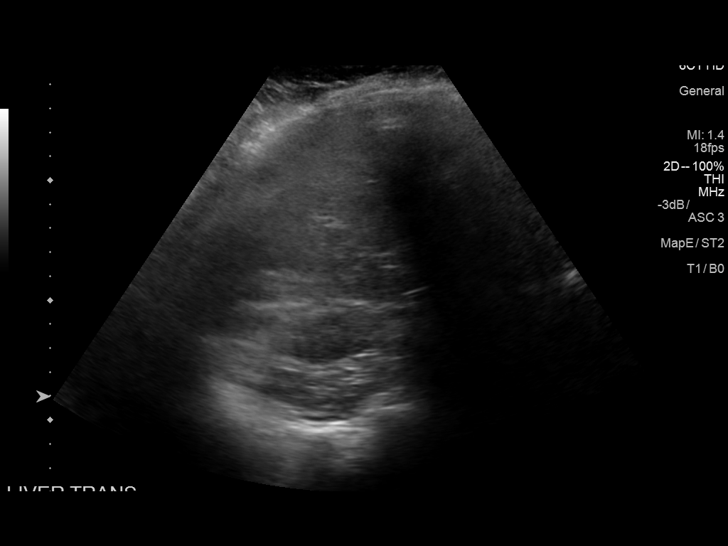

[14 of 25 positions shown; findings below may reference images not displayed]

FINDINGS: Gallbladder:

Contracted with multiple shadowing gallstones. Borderline wall
thickening. No sonographic Murphy sign noted by sonographer.

Common bile duct:

Diameter: 4 mm, normal.

Liver:

Nodular contour. No focal lesion identified. Mildly increased in
parenchymal echogenicity. Portal vein is patent on color Doppler
imaging with normal direction of blood flow towards the liver.
IMPRESSION: 1. Cirrhosis without focal liver abnormality.
2. Cholelithiasis.  Contracted gallbladder.

## 2019-12-23 IMAGING — US US ABDOMEN COMPLETE
1 series · 13 of 25 positions shown · non-contrast
Comparison: RIGHT UPPER QUADRANT abdominal ultrasound 08/16/2017.
CT abdomen and pelvis 04/29/2017, 03/21/2017 and earlier.

CLINICAL DATA: Acute generalized abdominal pain and distention.
Current history of hepatic cirrhosis.

EXAM:
ABDOMEN ULTRASOUND COMPLETE

[Series 1: us abdomen complete · 0.19mm/px · 13 of 103 slices shown]
[im 1/103]
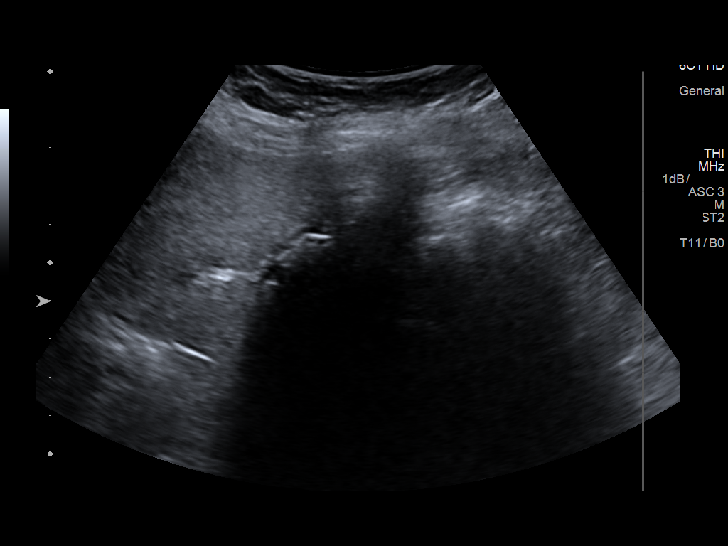
[im 9/103]
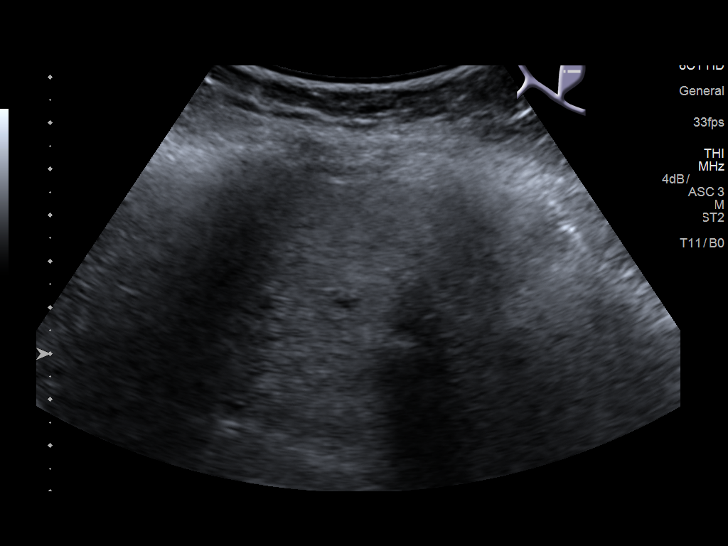
[im 18/103]
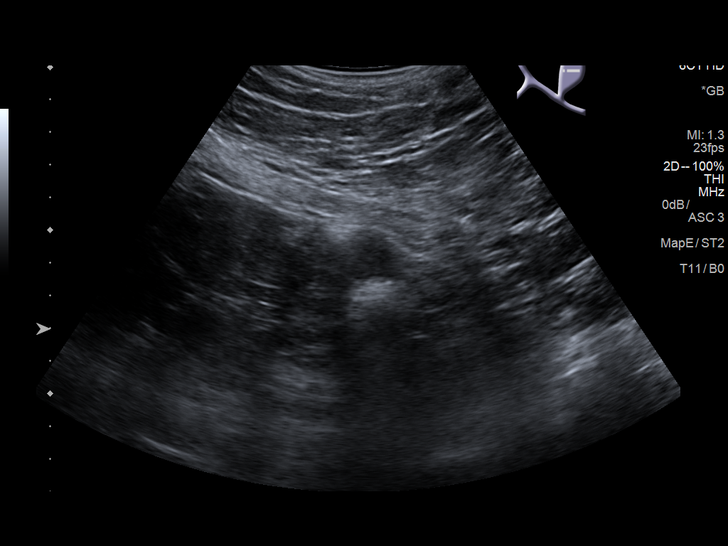
[im 26/103]
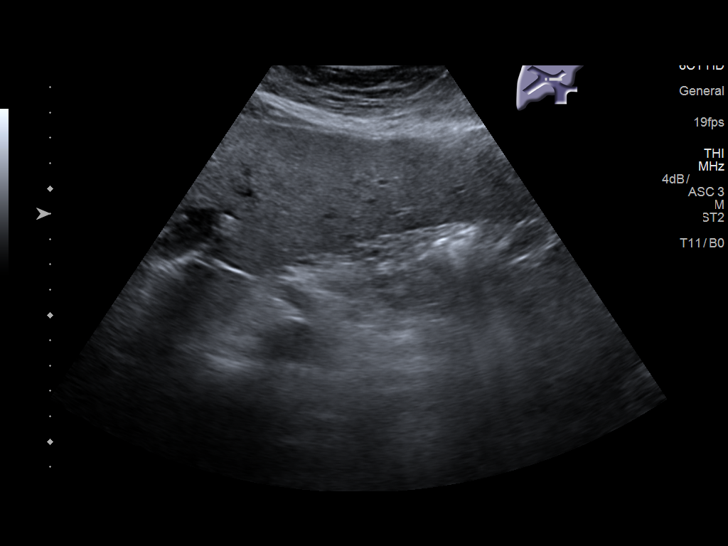
[im 35/103]
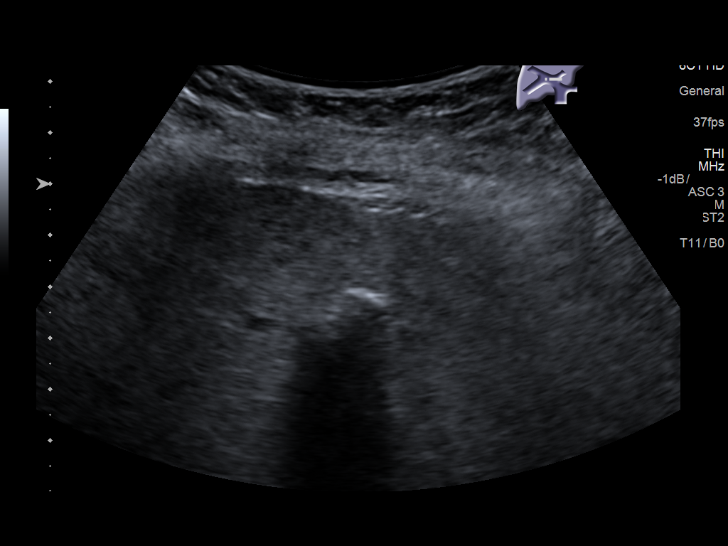
[im 43/103]
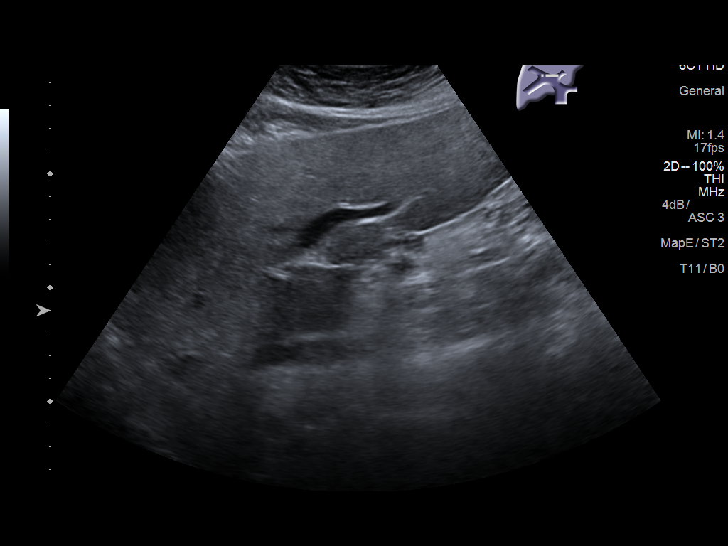
[im 52/103]
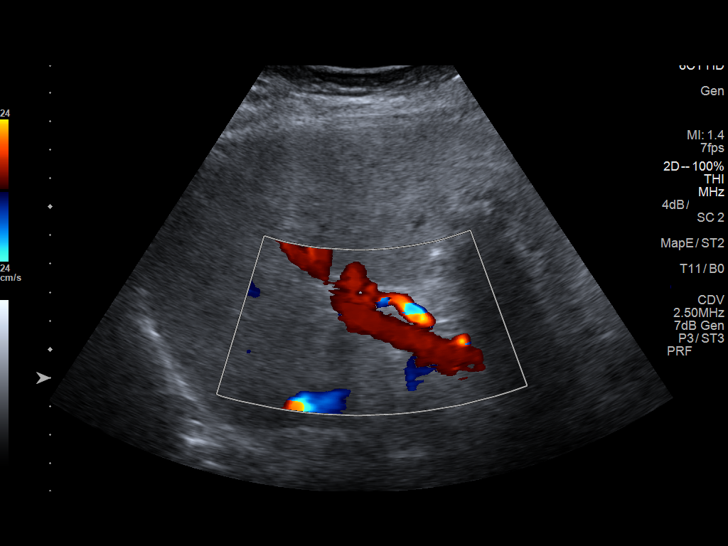
[im 60/103]
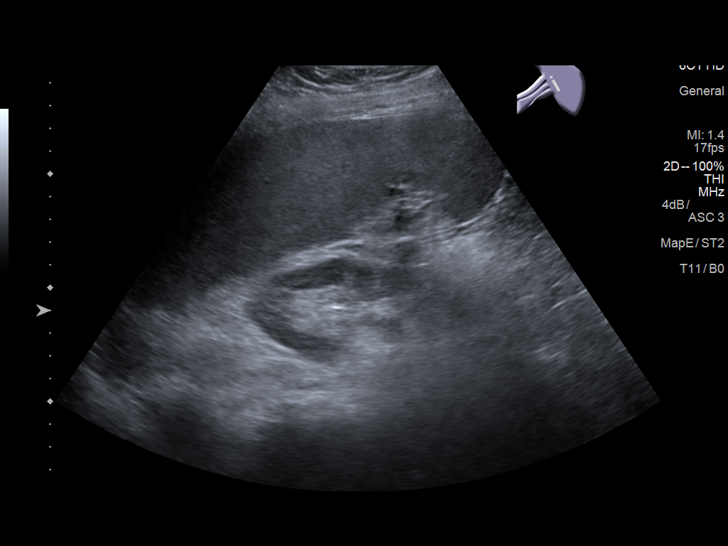
[im 69/103]
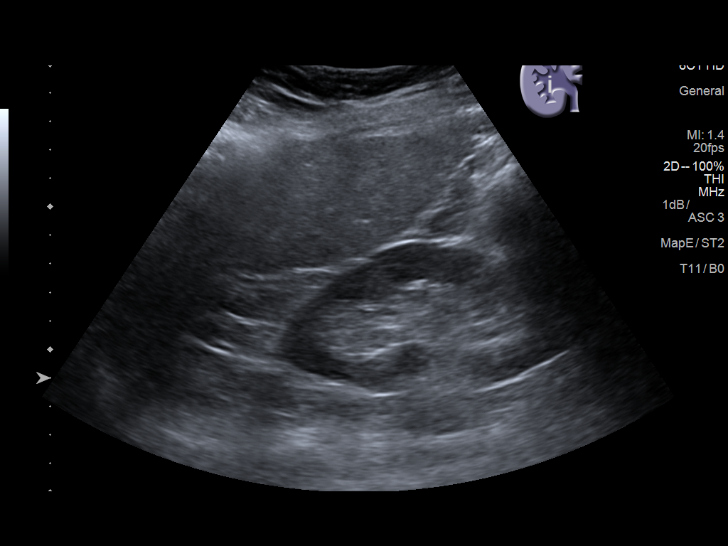
[im 77/103]
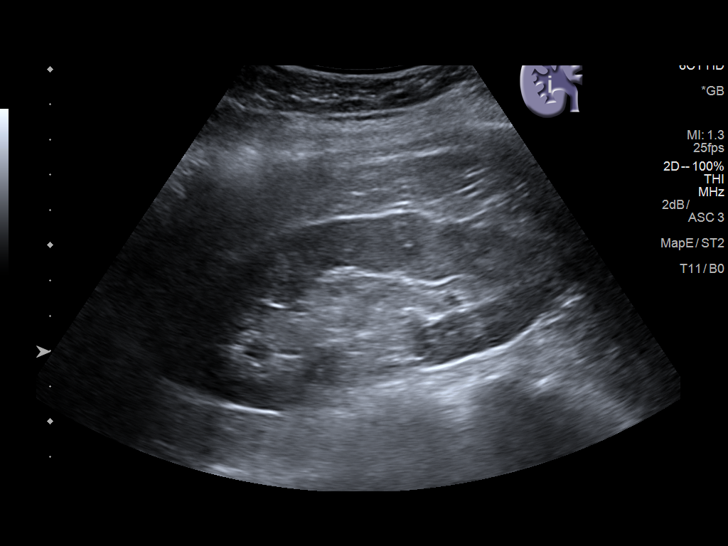
[im 86/103]
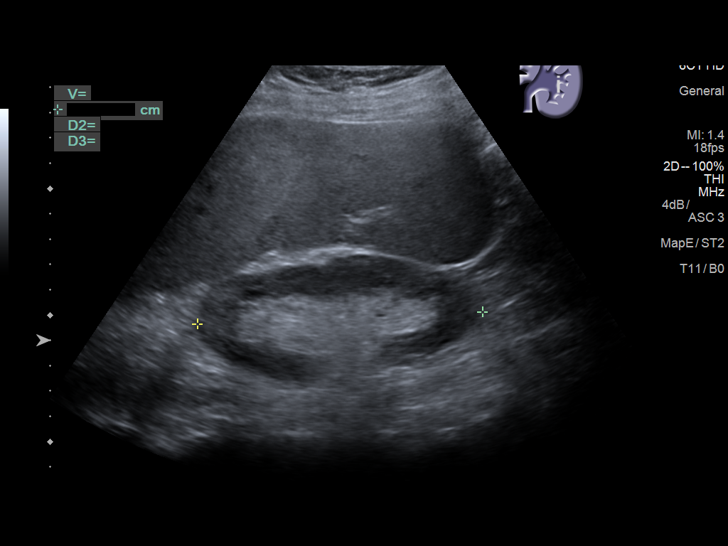
[im 94/103]
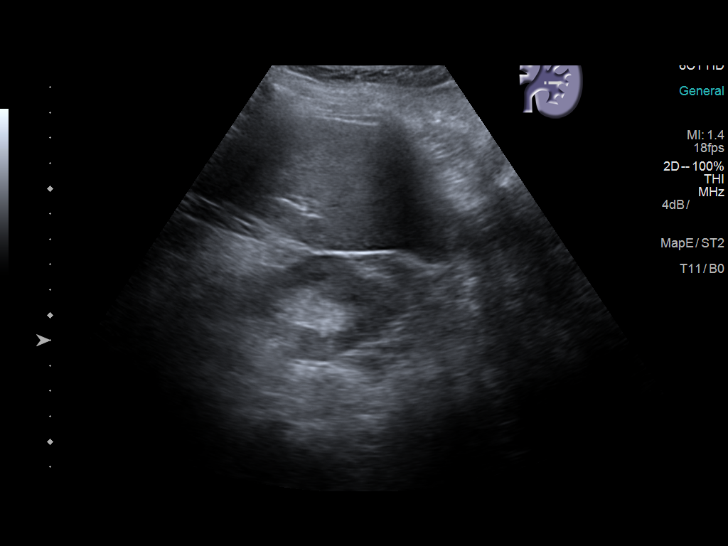
[im 103/103]
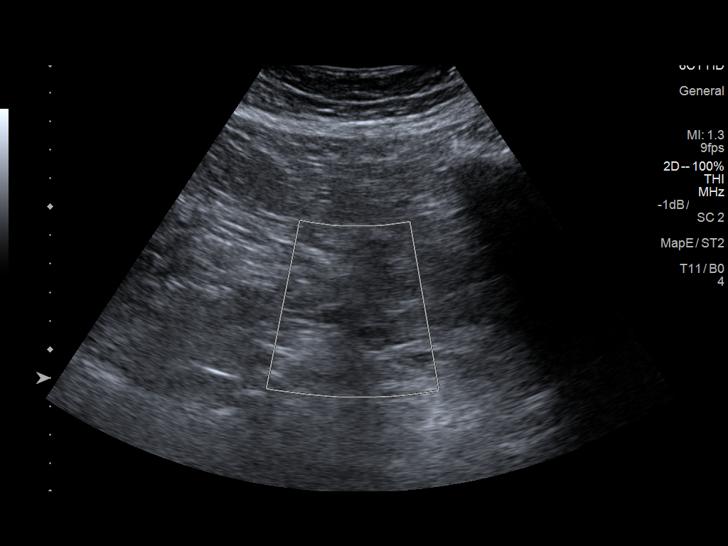

[13 of 25 positions shown; findings below may reference images not displayed]

FINDINGS: Gallbladder: Contracted containing numerous gallstones, the largest
measuring approximately 2.5 cm. No gallbladder wall thickening or
pericholecystic fluid. Negative sonographic Murphy's sign according
to the ultrasound technologist.

Common bile duct: Diameter: Approximately 5 mm.

Liver: Mildly irregular hepatic contour and relative enlargement of
the LEFT lobe and caudate lobe as noted on prior examinations.
Heterogeneous parenchymal echotexture without focal parenchymal
abnormality. Portal vein is patent on color Doppler imaging with
normal direction of blood flow towards the liver.

IVC: Patent.

Pancreas: While difficult to visualize in its entirety, visualized
portions normal in appearance. The pancreatic head is obscured by
overlying bowel gas.

Spleen: Enlarged, measuring approximately 16.1 x 17.6 x 6.0 cm
yielding a volume of approximately 900 mL. No focal parenchymal
abnormality.

Right Kidney: Length: Approximately 12.4 cm. No hydronephrosis.
Well-preserved cortex. No shadowing calculi. Normal parenchymal
echotexture. No focal parenchymal abnormality.

Left Kidney: Length: Approximately 11.3 cm. No hydronephrosis.
Well-preserved cortex. No shadowing calculi. Normal parenchymal
echotexture. No focal parenchymal abnormality.

Abdominal aorta: Normal in caliber throughout its visualized course
in the abdomen with evidence of atherosclerosis. Maximum diameter
2.8 cm.

Other findings: No evidence of ascites.
IMPRESSION: 1. Hepatic cirrhosis with evidence of portal venous hypertension as
there is splenomegaly.
2. No focal hepatic parenchymal abnormality. Patent portal vein with
normal antegrade hepatopetal flow.
3. Contracted gallbladder containing multiple gallstones. No
sonographic evidence of acute cholecystitis.
4.  Aortic Atherosclerosis ([id number]-170.0)

## 2020-01-04 ENCOUNTER — Other Ambulatory Visit: Payer: Self-pay

## 2020-01-04 ENCOUNTER — Ambulatory Visit (INDEPENDENT_AMBULATORY_CARE_PROVIDER_SITE_OTHER): Payer: Medicare HMO | Admitting: Family Medicine

## 2020-01-04 ENCOUNTER — Encounter: Payer: Self-pay | Admitting: Family Medicine

## 2020-01-04 VITALS — BP 152/80 | HR 90 | Temp 98.3°F | Ht 65.0 in | Wt 216.4 lb

## 2020-01-04 DIAGNOSIS — Z1231 Encounter for screening mammogram for malignant neoplasm of breast: Secondary | ICD-10-CM

## 2020-01-04 DIAGNOSIS — N135 Crossing vessel and stricture of ureter without hydronephrosis: Secondary | ICD-10-CM | POA: Insufficient documentation

## 2020-01-04 DIAGNOSIS — M47816 Spondylosis without myelopathy or radiculopathy, lumbar region: Secondary | ICD-10-CM | POA: Insufficient documentation

## 2020-01-04 DIAGNOSIS — I10 Essential (primary) hypertension: Secondary | ICD-10-CM

## 2020-01-04 DIAGNOSIS — K7460 Unspecified cirrhosis of liver: Secondary | ICD-10-CM | POA: Insufficient documentation

## 2020-01-04 DIAGNOSIS — F5101 Primary insomnia: Secondary | ICD-10-CM

## 2020-01-04 DIAGNOSIS — F339 Major depressive disorder, recurrent, unspecified: Secondary | ICD-10-CM

## 2020-01-04 DIAGNOSIS — E785 Hyperlipidemia, unspecified: Secondary | ICD-10-CM

## 2020-01-04 DIAGNOSIS — E119 Type 2 diabetes mellitus without complications: Secondary | ICD-10-CM | POA: Diagnosis not present

## 2020-01-04 DIAGNOSIS — Z8619 Personal history of other infectious and parasitic diseases: Secondary | ICD-10-CM

## 2020-01-04 DIAGNOSIS — E1169 Type 2 diabetes mellitus with other specified complication: Secondary | ICD-10-CM | POA: Diagnosis not present

## 2020-01-04 DIAGNOSIS — K7469 Other cirrhosis of liver: Secondary | ICD-10-CM

## 2020-01-04 DIAGNOSIS — M8000XA Age-related osteoporosis with current pathological fracture, unspecified site, initial encounter for fracture: Secondary | ICD-10-CM

## 2020-01-04 LAB — MICROALBUMIN / CREATININE URINE RATIO
Creatinine,U: 97.3 mg/dL
Microalb Creat Ratio: 0.7 mg/g (ref 0.0–30.0)
Microalb, Ur: <0.7 mg/dL (ref 0.0–1.9)

## 2020-01-04 LAB — CBC WITH DIFFERENTIAL/PLATELET
Basophils Absolute: 0 10*3/uL (ref 0.0–0.1)
Basophils Relative: 0.6 % (ref 0.0–3.0)
Eosinophils Absolute: 0.2 10*3/uL (ref 0.0–0.7)
Eosinophils Relative: 4.4 % (ref 0.0–5.0)
HCT: 41.6 % (ref 36.0–46.0)
Hemoglobin: 13 g/dL (ref 12.0–15.0)
Lymphocytes Relative: 17.5 % (ref 12.0–46.0)
Lymphs Abs: 1 10*3/uL (ref 0.7–4.0)
MCHC: 31.2 g/dL (ref 30.0–36.0)
MCV: 72.3 fl — ABNORMAL LOW (ref 78.0–100.0)
Monocytes Absolute: 0.5 10*3/uL (ref 0.1–1.0)
Monocytes Relative: 9.6 % (ref 3.0–12.0)
Neutro Abs: 3.7 10*3/uL (ref 1.4–7.7)
Neutrophils Relative %: 67.9 % (ref 43.0–77.0)
Platelets: 197 10*3/uL (ref 150.0–400.0)
RBC: 5.75 Mil/uL — ABNORMAL HIGH (ref 3.87–5.11)
RDW: 16.1 % — ABNORMAL HIGH (ref 11.5–15.5)
WBC: 5.5 10*3/uL (ref 4.0–10.5)

## 2020-01-04 LAB — COMPREHENSIVE METABOLIC PANEL
ALT: 24 U/L (ref 0–35)
AST: 30 U/L (ref 0–37)
Albumin: 4 g/dL (ref 3.5–5.2)
Alkaline Phosphatase: 52 U/L (ref 39–117)
BUN: 12 mg/dL (ref 6–23)
CO2: 27 mEq/L (ref 19–32)
Calcium: 8.9 mg/dL (ref 8.4–10.5)
Chloride: 105 mEq/L (ref 96–112)
Creatinine, Ser: 0.8 mg/dL (ref 0.40–1.20)
GFR: 77.84 mL/min (ref >60.00)
Glucose, Bld: 105 mg/dL — ABNORMAL HIGH (ref 70–99)
Potassium: 3.8 mEq/L (ref 3.5–5.1)
Sodium: 140 mEq/L (ref 135–145)
Total Bilirubin: 0.3 mg/dL (ref 0.2–1.2)
Total Protein: 7.6 g/dL (ref 6.0–8.3)

## 2020-01-04 LAB — POCT GLYCOSYLATED HEMOGLOBIN (HGB A1C): Hemoglobin A1C: 6.7 % — AB (ref 4.0–5.6)

## 2020-01-04 LAB — LIPID PANEL
Cholesterol: 174 mg/dL (ref 0–200)
HDL: 38.1 mg/dL — ABNORMAL LOW (ref >39.00)
LDL Cholesterol: 104 mg/dL — ABNORMAL HIGH (ref 0–99)
NonHDL: 136.04
Total CHOL/HDL Ratio: 5
Triglycerides: 159 mg/dL — ABNORMAL HIGH (ref 0.0–149.0)
VLDL: 31.8 mg/dL (ref 0.0–40.0)

## 2020-01-04 LAB — TSH: TSH: 3.34 u[IU]/mL (ref 0.35–4.50)

## 2020-01-04 MED ORDER — ROSUVASTATIN CALCIUM 10 MG PO TABS: 10.0000 mg | ORAL_TABLET | Freq: Every day | ORAL | 3 refills | Status: DC

## 2020-01-04 MED ORDER — ARNUITY ELLIPTA 100 MCG/ACT IN AEPB: 1.0000 | INHALATION_SPRAY | Freq: Every day | RESPIRATORY_TRACT | 5 refills | Status: DC

## 2020-01-04 MED ORDER — LISINOPRIL-HYDROCHLOROTHIAZIDE 20-25 MG PO TABS: 1.0000 | ORAL_TABLET | Freq: Every day | ORAL | 3 refills | Status: DC

## 2020-01-04 NOTE — Progress Notes (Signed)
Subjective  CC:  Chief Complaint  Patient presents with  . New Patient (Initial Visit)    Previously with Dr. Criss Rosales    HPI: Stacy Campbell is a 64 y.o. female who presents to [facility] today to establish care with me as a new patient.  I took care of Leshia and her family many years ago at [facility]: last saw in 2007. I have reviewed available records since. Has multiple problems and doesn't feel well overall. suffering from active depression and anxiety, chronic pain, DM, HLD, HTN and asthma. Also with MGUS, skin disorder managed by [facility] on cellcept. Also sees [facility] for cirrhosis w/ h/o Hep C, non-alcoholic.  Chart review shows ASCUS pap w/ neg HPV in 2019. No f/u since. No h/o abnl   She has the following concerns or needs:  Diabetes follow up: reports diagnosed with diabetes in 12/2018. On metformin. Feeling ok.  Her diabetic control is reported as Unchanged. No complications.  She denies exertional CP or SOB or symptomatic hypoglycemia. She denies foot sores or paresthesias. She is not on statin. She is on an ace. Needs eye exam. No h/o retinopathy.   HTN on carvedilol, hctz and ace. Reports control has not been good. No cp or sob.   HLD: goal LDL < 70 but not on statin.   Depression: sees therapist and psych. On multilple meds  On chronic oxy per pain mgt for low back pain described as DJD and bone spurs.   MGUS and derm managed by specialist.   Due labs and cpe.   imms are up to date.   Immunization History  Administered Date(s) Administered  . Hepatitis B, adult 10/12/2014, 11/09/2014, 04/11/2015  . Influenza Split 11/14/2010, 10/09/2011  . Influenza Whole 10/13/2007, 01/10/2009, 11/22/2009  . Influenza,inj,Quad PF,6+ Mos 10/10/2012, 10/08/2013, 10/15/2014, 09/29/2015, 11/06/2016, 09/10/2017, 10/15/2018, 09/30/2019  . PFIZER SARS-COV-2 Vaccination 03/20/2019, 04/15/2019, 10/15/2019  . Pneumococcal Polysaccharide-23 01/10/2009   . Td 01/09/1999, 01/10/2009    Diabetes Related Lab Review: Lab Results  Component Value Date   HGBA1C 6.7 (A) 01/04/2020   HGBA1C 6.8 08/14/2019   HGBA1C 9.8 (A) 11/24/2018    Lab Results  Component Value Date   MICROALBUR <0.7 01/04/2020    \  Lab Results  Component Value Date   CREATININE 0.80 01/04/2020   BUN 12 01/04/2020   NA 140 01/04/2020   K 3.8 01/04/2020   CL 105 01/04/2020   CO2 27 01/04/2020   Lab Results  Component Value Date   CHOL 174 01/04/2020   CHOL 178 12/11/2018   CHOL 166 04/19/2015   Lab Results  Component Value Date   HDL 38.10 (L) 01/04/2020   HDL 32 (L) 12/11/2018   HDL 33 (L) 04/19/2015   Lab Results  Component Value Date   LDLCALC 104 (H) 01/04/2020   LDLCALC 111 (H) 12/11/2018   LDLCALC 99 04/19/2015   Lab Results  Component Value Date   TRIG 159.0 (H) 01/04/2020   TRIG 196 (H) 12/11/2018   TRIG 170 (H) 04/19/2015   Lab Results  Component Value Date   CHOLHDL 5 01/04/2020   CHOLHDL 5.6 (H) 12/11/2018   CHOLHDL 5.0 04/19/2015   No results found for: LDLDIRECT The 10-year ASCVD risk score Mikey Bussing DC Jr., et al., 2013) is: 28.1%   Values used to calculate the score:     Age: 6 years     Sex: Female     Is Non-Hispanic African  American: Yes     Diabetic: Yes     Tobacco smoker: No     Systolic Blood Pressure: 0000000 mmHg     Is BP treated: Yes     HDL Cholesterol: 38.1 mg/dL     Total Cholesterol: 174 mg/dL  BP Readings from Last 3 Encounters:  01/04/20 (!) 152/80  08/28/19 136/80  08/14/19 140/82   Wt Readings from Last 3 Encounters:  01/04/20 216 lb 6.4 oz (98.2 kg)  08/28/19 214 lb (97.1 kg)  08/14/19 218 lb (98.9 kg)    Health Maintenance  Topic Date Due  . OPHTHALMOLOGY EXAM  01/09/2019  . TETANUS/TDAP  01/11/2019  . MAMMOGRAM  12/31/2019  . PAP SMEAR-Modifier  01/15/2020  . COVID-19 Vaccine (4 - Booster for Pfizer series) 04/14/2020  . HEMOGLOBIN A1C  07/04/2020  . FOOT EXAM  01/03/2021  . COLONOSCOPY  (Pts 45-62yrs Insurance coverage will need to be confirmed)  12/14/2029  . INFLUENZA VACCINE  Completed  . Hepatitis C Screening  Completed  . HIV Screening  Completed     Assessment  1. Diabetes mellitus without complication ([place])   2. Hyperlipidemia associated with type 2 diabetes mellitus ([place])   3. Major depression, recurrent, chronic ([place])   4. Essential hypertension   5. Primary insomnia   6. Other cirrhosis of liver ([place])   7. Encounter for screening mammogram for breast cancer   8. Spondylosis of lumbar region without myelopathy or radiculopathy   9. History of hepatitis C   10. Osteoporosis with current pathological fracture, unspecified osteoporosis type, initial encounter      Plan   Diabetic control is good on metformin and jardiance. rec eye exam. Check labs and urine. Education given  HTN: poor control. Change to lisinopril hct 20/25 and recheck in 6-12 weeks. Check lytes and renal function today  HLD w/ DM: goal LDL < 70. Start statin. Check lfts and lipids. Monitor lfts closely in setting of compensated cirrhosis  recquest GI records. Cirrhosis.   Insomnia and mood disorder per psych.   Chronic pain per pain mgt.   Osteoporosis, early due to chronic pred use in past. On fosamax. Request dexa results.   HM: due mammo. Will need f/u pap as well.   Follow up:  6 weeks for recheck.  Orders Placed This Encounter  Procedures  . MM DIGITAL SCREENING BILATERAL  . CBC with Differential/Platelet  . Comprehensive metabolic panel  . Lipid panel  . TSH  . Microalbumin / creatinine urine ratio  . POCT HgB A1C   Meds ordered this encounter  Medications  . lisinopril-hydrochlorothiazide (ZESTORETIC) 20-25 MG tablet    Sig: Take 1 tablet by mouth daily.    Dispense:  90 tablet    Refill:  3  . Fluticasone Furoate (ARNUITY ELLIPTA) 100 MCG/ACT AEPB    Sig: Inhale 1 puff into the lungs daily.    Dispense:  1 each    Refill:  5  . rosuvastatin (CRESTOR) 10 MG  tablet    Sig: Take 1 tablet (10 mg total) by mouth daily.    Dispense:  90 tablet    Refill:  3     Depression screen [facility] 2/9 01/04/2020 08/28/2019 08/14/2019 01/21/2019 11/24/2018  Decreased Interest 3 1 2 2 2   Down, Depressed, Hopeless 3 1 2 2 2   PHQ - 2 Score 6 2 4 4 4   Altered sleeping 1 3 3 3 3   Tired, decreased energy 3 1 1 1 1   Change  in appetite 2 1 1 1 1   Feeling bad or failure about yourself  1 0 0 1 2  Trouble concentrating 0 0 0 0 0  Moving slowly or fidgety/restless 0 0 0 0 0  Suicidal thoughts 0 0 0 0 0  PHQ-9 Score 13 7 9 10 11   Difficult doing work/chores - Extremely dIfficult - Very difficult Very difficult  Some recent data might be hidden    We updated and reviewed the patient's past history in detail and it is documented below.  Patient Active Problem List   Diagnosis Date Noted  . Cirrhosis of liver ([place]) - h/o hep C 01/04/2020    Priority: High  . Monoclonal gammopathy of unknown significance (MGUS) 01/23/2019    Priority: High  . Osteoporosis with current pathological fracture 01/21/2019    Priority: High    History of chronic pred for urticaria:  Advised to start alendronate jan 2021, stop jan 2026   . Controlled type 2 diabetes mellitus without complication, without long-term current use of insulin ([place]) 12/12/2018    Priority: High    Diagnosed 2020   . Hyperlipidemia associated with type 2 diabetes mellitus ([place]) 12/12/2018    Priority: High    LDL goal of 70 given diabetes   . Asthma with COPD ([place]) 05/25/2013    Priority: High  . Obesity, Class II, BMI 35-39.9 03/07/2006    Priority: High  . Major depression, recurrent, chronic ([place]) 03/07/2006    Priority: High    Sees psychiatry: Sharon Seller, [place]; [facility] with Zella Ball, PsyD   . Essential hypertension 03/07/2006    Priority: High  . Chronic low back pain 03/07/2006    Priority: High  . Insomnia 03/07/2006    Priority: High    On chronic Ambien  (started by previous provider).    . Degenerative joint disease (DJD) of lumbar spine 01/04/2020    Priority: Medium  . Mild persistent asthma without complication 84/13/2440    Priority: Medium  . History of hepatitis C 10/11/2017    Priority: Medium    S/P Treatment with sofosbuvir and simeprivir.  Followed by hep clinic.  Undetectable viral load - Cured.   Marland Kitchen History of atrial fibrillation 10/11/2017    Priority: Medium  . Former smoker 10/11/2017    Priority: Medium  . GERD (gastroesophageal reflux disease) 02/15/2017    Priority: Medium  . Psoriasis 07/10/2015    Priority: Medium  . Seasonal and perennial allergic rhinoconjunctivitis 02/12/2018    Priority: Low  . Dyshidrotic eczema 09/10/2017    Priority: Low  . 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 Stricture of ureter 01/04/2020  . Leg cramps 10/17/2014  . Knee pain, bilateral 10/08/2013   Health Maintenance  Topic Date Due  . OPHTHALMOLOGY EXAM  01/09/2019  . TETANUS/TDAP  01/11/2019  . MAMMOGRAM  12/31/2019  . PAP SMEAR-Modifier  01/15/2020  . COVID-19 Vaccine (4 - Booster for Pfizer series) 04/14/2020  . HEMOGLOBIN A1C  07/04/2020  . FOOT EXAM  01/03/2021  . COLONOSCOPY (Pts 45-68yrs Insurance coverage will need to be confirmed)  12/14/2029  . INFLUENZA VACCINE  Completed  . Hepatitis C Screening  Completed  . HIV Screening  Completed   Immunization History  Administered Date(s) Administered  . Hepatitis B, adult 10/12/2014, 11/09/2014, 04/11/2015  . Influenza Split 11/14/2010, 10/09/2011  . Influenza Whole 10/13/2007, 01/10/2009, 11/22/2009  . Influenza,inj,Quad PF,6+ Mos 10/10/2012, 10/08/2013, 10/15/2014, 09/29/2015, 11/06/2016, 09/10/2017, 10/15/2018, 09/30/2019  .  PFIZER SARS-COV-2 Vaccination 03/20/2019, 04/15/2019, 10/15/2019  . Pneumococcal Polysaccharide-23 01/10/2009  . Td 01/09/1999, 01/10/2009   Current Meds  Medication Sig  . alendronate (FOSAMAX) 70 MG tablet Take 1 tablet (70  mg total) by mouth once a week. Take with a full glass of water on an empty stomach.  . ARIPiprazole (ABILIFY) 5 MG tablet Take 1 tablet (5 mg total) by mouth daily.  . Aspirin-Salicylamide-Caffeine (BC HEADACHE PO) Take 1 packet by mouth daily as needed (headaches).  . carvedilol (COREG) 25 MG tablet Take 1 tablet (25 mg total) by mouth 2 (two) times daily with a meal.  . doxepin (SINEQUAN) 25 MG capsule Take 25 mg by mouth 3 (three) times daily as needed.  Marland Kitchen EPINEPHrine 0.3 mg/0.3 mL IJ SOAJ injection Use as directed for severe allergic reaction  . glucose blood test strip Test blood sugar daily and record on blood sugar log  . JARDIANCE 10 MG TABS tablet TAKE 1 TABLET BY MOUTH DAILY  . Lancets (ACCU-CHEK SOFT TOUCH) lancets Check blood sugar daily and record in log  . lisinopril-hydrochlorothiazide (ZESTORETIC) 20-25 MG tablet Take 1 tablet by mouth daily.  . meclizine (ANTIVERT) 25 MG tablet Take 1 tablet (25 mg total) by mouth 3 (three) times daily as needed for dizziness.  . metFORMIN (GLUCOPHAGE-XR) 500 MG 24 hr tablet TAKE 1 TABLET(500 MG) BY MOUTH DAILY WITH BREAKFAST  . mycophenolate (CELLCEPT) 500 MG tablet Take 1 tablet by mouth 2 (two) times daily. Written by derm  . promethazine (PHENERGAN) 25 MG tablet take 1 tablet by mouth every 8 hours if needed for nausea and vomiting  . rosuvastatin (CRESTOR) 10 MG tablet Take 1 tablet (10 mg total) by mouth daily.  . sertraline (ZOLOFT) 100 MG tablet TAKE 1 TABLET(100 MG) BY MOUTH AT BEDTIME  . tiZANidine (ZANAFLEX) 4 MG tablet Take 4 mg by mouth 2 (two) times daily.  Marland Kitchen triamcinolone ointment (KENALOG) 0.1 % Apply 1 application topically 2 (two) times daily.  . VENTOLIN HFA 108 (90 Base) MCG/ACT inhaler INHALE 2 PUFFS INTO THE LUNGS EVERY 6 HOURS AS NEEDED FOR WHEEZING OR SHORTNESS OF BREATH  . zolpidem (AMBIEN) 10 MG tablet Take 10 mg by mouth at bedtime.  . [DISCONTINUED] benzonatate (TESSALON) 100 MG capsule Take 1 capsule (100 mg total)  by mouth 3 (three) times daily as needed for cough.  . [DISCONTINUED] Fluticasone Furoate (ARNUITY ELLIPTA) 100 MCG/ACT AEPB Inhale 1 puff into the lungs daily.  . [DISCONTINUED] hydrochlorothiazide (HYDRODIURIL) 25 MG tablet Take 1 tablet (25 mg total) by mouth 2 (two) times daily.  . [DISCONTINUED] lisinopril (PRINIVIL,ZESTRIL) 10 MG tablet Take 1 tablet (10 mg total) by mouth daily.    Allergies: Patient is allergic to hydrocodone, linalool, propylene glycol, aspirin, and eggs or egg-derived products. Past Medical History Patient  has a past medical history of Angio-edema, Anxiety, Arthritis, Asthma, Bone spur, Chronic lower back pain, Cirrhosis of liver ([place]) (12/2018), Depression, Diabetes mellitus without complication ([place]), Eczema, GERD (gastroesophageal reflux disease), Hepatitis C, History of atrial fibrillation without current medication, Hypertension, Insomnia, Osteoporosis, and Urticaria. Past Surgical History Patient  has a past surgical history that includes Cystoscopy with retrograde pyelogram, ureteroscopy and stent placement (Left, 01/29/2017); Cardioversion (1990s); Cystoscopy w/ ureteral stent placement (Left, 01/29/2017); and Cholecystectomy (N/A, 01/22/2018). Family History: Patient family history includes Asthma in her father and mother; Cancer in her father; Diabetes in her father and mother; Eczema in her father; Hepatitis B in her daughter. Social History:  Patient  reports  that she quit smoking about 11 years ago. Her smoking use included cigarettes. She has a 9.25 pack-year smoking history. She has never used smokeless tobacco. She reports that she does not drink alcohol and does not use drugs.  Review of Systems: Constitutional: negative for fever or malaise Ophthalmic: negative for photophobia, double vision or loss of vision Cardiovascular: negative for chest pain, dyspnea on exertion, or new LE swelling Respiratory: negative for SOB or persistent  cough Gastrointestinal: negative for abdominal pain, change in bowel habits or melena Genitourinary: negative for dysuria or gross hematuria Musculoskeletal: negative for new gait disturbance or muscular weakness Integumentary: negative for new or persistent rashes Neurological: negative for TIA or stroke symptoms Psychiatric: negative for SI or delusions Allergic/Immunologic: negative for hives  Patient Care Team    Relationship Specialty Notifications Start End  [name], MD PCP - General Family Medicine  01/04/20   Arta Silence, MD Consulting Physician Gastroenterology  01/04/20   Linward Headland, NP Nurse Practitioner Psychology  01/04/20     Objective  Vitals: BP (!) 152/80   Pulse 90   Temp 98.3 F (36.8 C) (Temporal)   Ht 5\' 5"  (1.651 m)   Wt 216 lb 6.4 oz (98.2 kg)   SpO2 96%   BMI 36.01 kg/m  General:  Well developed, well nourished, no acute distress  Psych:  Alert and oriented,normal mood and affect HEENT:  Normocephalic, atraumatic, non-icteric sclera, supple neck without adenopathy, mass or thyromegaly Cardiovascular:  RRR without gallop, rub or murmur Respiratory:  Good breath sounds bilaterally, CTAB with normal respiratory effort Neurologic:    Mental status is normal. Gross motor and sensory exams are normal. Normal gait Diabetic Foot Exam: Appearance - no lesions, ulcers or calluses Skin - no sigificant pallor or erythema Monofilament testing - sensitive bilaterally in following locations:  Right - Great toe, medial, central, lateral ball and posterior foot intact  Left - Great toe, medial, central, lateral ball and posterior foot intact Pulses - +2 distally bilaterally   Commons side effects, risks, benefits, and alternatives for medications and treatment plan prescribed today were discussed, and the patient expressed understanding of the given instructions. Patient is instructed to call or message via MyChart if he/she has any questions or  concerns regarding our treatment plan. No barriers to understanding were identified. We discussed Red Flag symptoms and signs in detail. Patient expressed understanding regarding what to do in case of urgent or emergency type symptoms.   Medication list was reconciled, printed and provided to the patient in AVS. Patient instructions and summary information was reviewed with the patient as documented in the AVS. This note was prepared with assistance of Dragon voice recognition software. Occasional wrong-word or sound-a-like substitutions may have occurred due to the inherent limitations of voice recognition software  This visit occurred during the SARS-CoV-2 public health emergency.  Safety protocols were in place, including screening questions prior to the visit, additional usage of staff PPE, and extensive cleaning of exam room while observing appropriate contact time as indicated for disinfecting solutions.

## 2020-01-04 NOTE — Patient Instructions (Signed)
It was so good seeing you again! Thank you for establishing with my new practice and allowing me to continue caring for you. It means a lot to me.   Please schedule a follow up appointment with me in 6 weeks for recheck diabetes, blood pressure. Please sign release of records for Dr. Dulce Sellar and your pain management doctors.   Please start crestor for your cholesterol.  Please change to the combination lisinopril-hct 20/25 once a day and stop the hctz and lisinopril 10.  I have refilled your asthma inhaler.   Continue your diabetes medications.  Please set up an appointment for a diabetic eye exam and have the results sent to me.   I will release your lab results to you on your MyChart account with further instructions. Please reply with any questions.

## 2020-01-19 ENCOUNTER — Encounter: Payer: Self-pay | Admitting: Family Medicine

## 2020-02-15 ENCOUNTER — Encounter: Payer: Self-pay | Admitting: Family Medicine

## 2020-02-15 ENCOUNTER — Other Ambulatory Visit: Payer: Self-pay

## 2020-02-15 ENCOUNTER — Ambulatory Visit (INDEPENDENT_AMBULATORY_CARE_PROVIDER_SITE_OTHER): Payer: Medicare HMO | Admitting: Family Medicine

## 2020-02-15 VITALS — BP 150/82 | HR 84 | Temp 97.9°F | Ht 65.0 in | Wt 218.8 lb

## 2020-02-15 DIAGNOSIS — K7469 Other cirrhosis of liver: Secondary | ICD-10-CM

## 2020-02-15 DIAGNOSIS — I10 Essential (primary) hypertension: Secondary | ICD-10-CM | POA: Diagnosis not present

## 2020-02-15 DIAGNOSIS — F339 Major depressive disorder, recurrent, unspecified: Secondary | ICD-10-CM

## 2020-02-15 DIAGNOSIS — E119 Type 2 diabetes mellitus without complications: Secondary | ICD-10-CM

## 2020-02-15 DIAGNOSIS — E1169 Type 2 diabetes mellitus with other specified complication: Secondary | ICD-10-CM | POA: Diagnosis not present

## 2020-02-15 DIAGNOSIS — E785 Hyperlipidemia, unspecified: Secondary | ICD-10-CM

## 2020-02-15 LAB — COMPREHENSIVE METABOLIC PANEL
ALT: 20 U/L (ref 0–35)
AST: 25 U/L (ref 0–37)
Albumin: 3.9 g/dL (ref 3.5–5.2)
Alkaline Phosphatase: 56 U/L (ref 39–117)
BUN: 27 mg/dL — ABNORMAL HIGH (ref 6–23)
CO2: 28 mEq/L (ref 19–32)
Calcium: 9.6 mg/dL (ref 8.4–10.5)
Chloride: 104 mEq/L (ref 96–112)
Creatinine, Ser: 1.53 mg/dL — ABNORMAL HIGH (ref 0.40–1.20)
GFR: 35.72 mL/min — ABNORMAL LOW (ref >60.00)
Glucose, Bld: 106 mg/dL — ABNORMAL HIGH (ref 70–99)
Potassium: 4 mEq/L (ref 3.5–5.1)
Sodium: 139 mEq/L (ref 135–145)
Total Bilirubin: 0.3 mg/dL (ref 0.2–1.2)
Total Protein: 7.5 g/dL (ref 6.0–8.3)

## 2020-02-15 LAB — LIPID PANEL
Cholesterol: 140 mg/dL (ref 0–200)
HDL: 37.5 mg/dL — ABNORMAL LOW (ref >39.00)
LDL Cholesterol: 73 mg/dL (ref 0–99)
NonHDL: 102.45
Total CHOL/HDL Ratio: 4
Triglycerides: 148 mg/dL (ref 0.0–149.0)
VLDL: 29.6 mg/dL (ref 0.0–40.0)

## 2020-02-15 MED ORDER — AMLODIPINE BESYLATE 10 MG PO TABS: 10.0000 mg | ORAL_TABLET | Freq: Every day | ORAL | 3 refills | Status: DC

## 2020-02-15 MED ORDER — EMPAGLIFLOZIN 10 MG PO TABS: 10.0000 mg | ORAL_TABLET | Freq: Every day | ORAL | 3 refills | Status: DC

## 2020-02-15 NOTE — Patient Instructions (Addendum)
Please return in 2 months for blood pressure recheck and 3 -4 months for a complete physical with pap smear.   Please schedule your mammogram and eye appointment.  I will release your lab results to you on your MyChart account with further instructions. Please reply with any questions.   I have ordered an additional blood pressure medication to be taken in addition to your other medications: amlodipine 10mg  daily.   If you have any questions or concerns, please don't hesitate to send me a message via MyChart or call the office at [phone]. Thank you for visiting with [place] today! It's our pleasure caring for you.

## 2020-02-15 NOTE — Progress Notes (Signed)
Subjective  CC:  Chief Complaint  Patient presents with  . Hypertension  . Diabetes  . Medication Refill    Jardiance    HPI: Stacy Campbell is a 65 y.o. female who presents to the office today for follow up of diabetes and problems listed above in the chief complaint.   Diabetes follow up: Her diabetic control is reported as Unchanged.  She is doing well taking Metformin and Jardiance.  Her dermatology started her Diflucan weekly for a yeast dermatitis.  She denies vaginal discharge.  No adverse effects from her medications. She denies exertional CP or SOB or symptomatic hypoglycemia. She denies foot sores or paresthesias.  She needs to schedule an eye exam.  No history of retinopathy.  Hypertension: Medications were slightly adjusted 6 weeks ago.  She is taking carvedilol twice daily and Zestoretic 20/25 daily.  Blood pressure is basically unchanged.  She feels well.  No chest pain or shortness of breath.  No lower extremity edema.  She reports she is compliant with medications.  Hyperlipidemia: Started Crestor 10 mg nightly at last visit.  She is tolerating this well.  Depression: On multiple medications per psychiatry.  On second-generation antipsychotic which increases risk of hyperglycemia and hyperlipidemia.  She reports she is having good days and bad days.  Nothing worse than usual.  Continues with psychotherapy.  Health maintenance: Due mammogram.  Eligible for Shingrix.  She reports she believes her dermatologist does not want her to take this at the moment.  She has follow-up with her shortly and will clarify.  She remains on CellCept Wt Readings from Last 3 Encounters:  02/15/20 218 lb 12.8 oz (99.2 kg)  01/04/20 216 lb 6.4 oz (98.2 kg)  08/28/19 214 lb (97.1 kg)    BP Readings from Last 3 Encounters:  02/15/20 (!) 150/82  01/04/20 (!) 152/80  08/28/19 136/80    Assessment  1. Essential hypertension   2. Controlled type 2 diabetes mellitus without  complication, without long-term current use of insulin ([place])   3. Hyperlipidemia associated with type 2 diabetes mellitus ([place])   4. Other cirrhosis of liver ([place])   5. Diabetes mellitus without complication ([place])   6. Major depression, recurrent, chronic (HCC) Chronic     Plan   Diabetes is currently very well controlled.  Continue Metformin and Jardiance.  Jardiance refilled.  Patient to schedule eye exam  Hypertension: Marginally controlled: Add amlodipine 10 mg to carvedilol 25 twice daily and Zestoretic 20/25 daily.  Recheck 6 to 8 weeks.  Check renal function  Hyperlipidemia on Crestor 10 mg nightly.  Recheck LDL.  Goal less than 70.  Compensated cirrhosis  Depression: Fairly stable.  Hard to control.  Per psychiatry.  Health maintenance: Patient to schedule mammogram.  Consider Shingrix if okayed by dermatology  Follow up: 8 weeks for blood pressure recheck, 12 weeks for complete physical with Pap smear.. Orders Placed This Encounter  Procedures  . Comprehensive metabolic panel  . Lipid panel  . AMB Referral to [facility] ordered this encounter  Medications  . empagliflozin (JARDIANCE) 10 MG TABS tablet    Sig: Take 1 tablet (10 mg total) by mouth daily.    Dispense:  90 tablet    Refill:  3  . amLODipine (NORVASC) 10 MG tablet    Sig: Take 1 tablet (10 mg total) by mouth daily.    Dispense:  90 tablet    Refill:  3  Immunization History  Administered Date(s) Administered  . Hepatitis B, adult 10/12/2014, 11/09/2014, 04/11/2015  . Influenza Split 11/14/2010, 10/09/2011  . Influenza Whole 10/13/2007, 01/10/2009, 11/22/2009  . Influenza,inj,Quad PF,6+ Mos 10/10/2012, 10/08/2013, 10/15/2014, 09/29/2015, 11/06/2016, 09/10/2017, 10/15/2018, 09/30/2019  . PFIZER(Purple Top)SARS-COV-2 Vaccination 03/20/2019, 04/15/2019, 10/15/2019  . Pneumococcal Polysaccharide-23 01/10/2009  . Td 01/09/1999, 01/10/2009    Diabetes Related Lab  Review: Lab Results  Component Value Date   HGBA1C 6.7 (A) 01/04/2020   HGBA1C 6.8 08/14/2019   HGBA1C 9.8 (A) 11/24/2018    Lab Results  Component Value Date   MICROALBUR <0.7 01/04/2020   Lab Results  Component Value Date   CREATININE 0.80 01/04/2020   BUN 12 01/04/2020   NA 140 01/04/2020   K 3.8 01/04/2020   CL 105 01/04/2020   CO2 27 01/04/2020   Lab Results  Component Value Date   CHOL 174 01/04/2020   CHOL 178 12/11/2018   CHOL 166 04/19/2015   Lab Results  Component Value Date   HDL 38.10 (L) 01/04/2020   HDL 32 (L) 12/11/2018   HDL 33 (L) 04/19/2015   Lab Results  Component Value Date   LDLCALC 104 (H) 01/04/2020   LDLCALC 111 (H) 12/11/2018   LDLCALC 99 04/19/2015   Lab Results  Component Value Date   TRIG 159.0 (H) 01/04/2020   TRIG 196 (H) 12/11/2018   TRIG 170 (H) 04/19/2015   Lab Results  Component Value Date   CHOLHDL 5 01/04/2020   CHOLHDL 5.6 (H) 12/11/2018   CHOLHDL 5.0 04/19/2015   No results found for: LDLDIRECT The 10-year ASCVD risk score Mikey Bussing DC Jr., et al., 2013) is: 27.3%   Values used to calculate the score:     Age: 24 years     Sex: Female     Is Non-Hispanic African American: Yes     Diabetic: Yes     Tobacco smoker: No     Systolic Blood Pressure: Q000111Q mmHg     Is BP treated: Yes     HDL Cholesterol: 38.1 mg/dL     Total Cholesterol: 174 mg/dL I have reviewed the [place], [facility] Soc history. Patient Active Problem List   Diagnosis Date Noted  . Cirrhosis of liver ([place]) - h/o hep C 01/04/2020    Priority: High  . Monoclonal gammopathy of unknown significance ([facility]) 01/23/2019    Priority: High  . Osteoporosis with current pathological fracture 01/21/2019    Priority: High    History of chronic pred for urticaria:  Advised to start alendronate jan 2021, stop jan 2026   . Controlled type 2 diabetes mellitus without complication, without long-term current use of insulin ([place]) 12/12/2018    Priority: High    Diagnosed  2020   . Hyperlipidemia associated with type 2 diabetes mellitus ([place]) 12/12/2018    Priority: High    LDL goal of 70 given diabetes   . Asthma with COPD ([place]) 05/25/2013    Priority: High  . Obesity, Class II, BMI 35-39.9 03/07/2006    Priority: High  . Major depression, recurrent, chronic ([place]) 03/07/2006    Priority: High    Sees psychiatry: Sharon Seller, [place]; [facility]otherapy with Zella Ball, PsyD   . Essential hypertension 03/07/2006    Priority: High  . Chronic low back pain 03/07/2006    Priority: High  . Insomnia 03/07/2006    Priority: High    On chronic Ambien (started by previous provider).    . Degenerative joint disease (DJD)  of lumbar spine 01/04/2020    Priority: Medium  . History of hepatitis C 10/11/2017    Priority: Medium    S/P Treatment with sofosbuvir and simeprivir.  Followed by hep clinic.  Undetectable viral load - Cured.   Marland Kitchen History of atrial fibrillation 10/11/2017    Priority: Medium  . Former smoker 10/11/2017    Priority: Medium  . GERD (gastroesophageal reflux disease) 02/15/2017    Priority: Medium  . Psoriasis 07/10/2015    Priority: Medium  . Seasonal and perennial allergic rhinoconjunctivitis 02/12/2018    Priority: Low  . Dyshidrotic eczema 09/10/2017    Priority: Low  . 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 Stricture of ureter 01/04/2020    Social History: Patient  reports that she quit smoking about 11 years ago. Her smoking use included cigarettes. She has a 9.25 pack-year smoking history. She has never used smokeless tobacco. She reports that she does not drink alcohol and does not use drugs.  Review of Systems: Ophthalmic: negative for eye pain, loss of vision or double vision Cardiovascular: negative for chest pain Respiratory: negative for SOB or persistent cough Gastrointestinal: negative for abdominal pain Genitourinary: negative for dysuria or gross hematuria MSK: negative for foot  lesions Neurologic: negative for weakness or gait disturbance  Objective  Vitals: BP (!) 150/82   Pulse 84   Temp 97.9 F (36.6 C) (Temporal)   Ht 5\' 5"  (1.651 m)   Wt 218 lb 12.8 oz (99.2 kg)   SpO2 93%   BMI 36.41 kg/m  General: well appearing, no acute distress  Psych:  Alert and oriented, normal mood and affect Cardiovascular:  Nl S1 and S2, RRR without murmur, gallop or rub. no edema Respiratory:  Good breath sounds bilaterally, CTAB with normal effort, no rales      Diabetic education: ongoing education regarding chronic disease management for diabetes was given today. We continue to reinforce the ABC's of diabetic management: A1c (<7 or 8 dependent upon patient), tight blood pressure control, and cholesterol management with goal LDL < 100 minimally. We discuss diet strategies, exercise recommendations, medication options and possible side effects. At each visit, we review recommended immunizations and preventive care recommendations for diabetics and stress that good diabetic control can prevent other problems. See below for this patient's data.    Commons side effects, risks, benefits, and alternatives for medications and treatment plan prescribed today were discussed, and the patient expressed understanding of the given instructions. Patient is instructed to call or message via MyChart if he/she has any questions or concerns regarding our treatment plan. No barriers to understanding were identified. We discussed Red Flag symptoms and signs in detail. Patient expressed understanding regarding what to do in case of urgent or emergency type symptoms.   Medication list was reconciled, printed and provided to the patient in AVS. Patient instructions and summary information was reviewed with the patient as documented in the AVS. This note was prepared with assistance of Dragon voice recognition software. Occasional wrong-word or sound-a-like substitutions may have occurred due to the  inherent limitations of voice recognition software  This visit occurred during the SARS-CoV-2 public health emergency.  Safety protocols were in place, including screening questions prior to the visit, additional usage of staff PPE, and extensive cleaning of exam room while observing appropriate contact time as indicated for disinfecting solutions.

## 2020-02-18 ENCOUNTER — Other Ambulatory Visit: Payer: Self-pay | Admitting: *Deleted

## 2020-02-18 ENCOUNTER — Telehealth: Payer: Self-pay

## 2020-02-18 DIAGNOSIS — I10 Essential (primary) hypertension: Secondary | ICD-10-CM

## 2020-02-18 MED ORDER — CARVEDILOL 25 MG PO TABS: 25.0000 mg | ORAL_TABLET | Freq: Two times a day (BID) | ORAL | 3 refills | Status: DC

## 2020-02-18 NOTE — Telephone Encounter (Signed)
Rx send to pharmacy  

## 2020-02-18 NOTE — Telephone Encounter (Signed)
  LAST APPOINTMENT DATE: 02/15/2020   NEXT APPOINTMENT DATE:@4 /29/2022  MEDICATION:carvedilol (COREG) 25 MG tablet  PHARMACY:[id number] - [place], [place]   Please advise

## 2020-02-23 ENCOUNTER — Telehealth: Payer: Self-pay | Admitting: *Deleted

## 2020-02-23 ENCOUNTER — Other Ambulatory Visit: Payer: Self-pay

## 2020-02-23 ENCOUNTER — Telehealth: Payer: Medicare HMO

## 2020-02-23 NOTE — Telephone Encounter (Signed)
  Chronic Care Management   Outreach Note  02/23/2020 Name: ANNELLA PROWELL MRN: [id number] DOB: 30-Mar-1955  Referred by: [name], MD Reason for referral : Chronic Care Management (HTN, DM II)   An unsuccessful telephone outreach was attempted today. The patient was referred to the case management team for assistance with care management and care coordination.     Follow Up Plan: A HIPAA compliant phone message was left for the patient providing contact information and requesting a return call.     As RNCM documenting this note received call back from patient.  RNCM introduced self and role.  Discussed and reviewed CCM services and patient verbally agrees.  States she is unable to complete initial assessment today and request call back another day and time.  RNCM will outreach to patient on 02/25/20 as patient request.    Hubert Azure RN, MSN RN Care Management Coordinator  [facility] [phone] [email or web address]

## 2020-02-25 ENCOUNTER — Telehealth: Payer: Medicare HMO

## 2020-02-25 ENCOUNTER — Telehealth: Payer: Self-pay | Admitting: *Deleted

## 2020-02-25 NOTE — Telephone Encounter (Signed)
  Chronic Care Management   Outreach Note  02/25/2020 Name: Stacy Campbell MRN: [id number] DOB: January 28, 1955  Referred by: [name], MD Reason for referral : Chronic Care Management (HTN, DM II)   An unsuccessful telephone outreach was attempted today. The patient was referred to the case management team for assistance with care management and care coordination.     Follow Up Plan: RNCM will request assistance from Care Guide to reschedule initial assessment telephone outreach appoitment within the next 10 business days.  Hubert Azure RN, MSN RN Care Management Coordinator  [place] [phone] [email or web address]

## 2020-03-01 ENCOUNTER — Telehealth: Payer: Self-pay | Admitting: *Deleted

## 2020-03-01 NOTE — Chronic Care Management (AMB) (Signed)
  Chronic Care Management   Note  03/01/2020 Name: [name] MRN: [id number] DOB: [birth year]  Stacy Campbell is a 65 y.o. year old female who is a primary care patient of [name], MD. I reached out to [name] by phone today in response to a referral sent by Ms. Ceairra D Lamontagne's PCP,Andy, Karie Fetch, MD.  Ms. Vancleve was given information about Chronic Care Management services today including:  1. CCM service includes personalized support from designated clinical staff supervised by her physician, including individualized plan of care and coordination with other care providers 2. 24/7 contact phone numbers for assistance for urgent and routine care needs. 3. Service will only be billed when office clinical staff spend 20 minutes or more in a month to coordinate care. 4. Only one practitioner may furnish and bill the service in a calendar month. 5. The patient may stop CCM services at any time (effective at the end of the month) by phone call to the office staff. 6. The patient will be responsible for cost sharing (co-pay) of up to 20% of the service fee (after annual deductible is met).  Patient agreed to services and verbal consent obtained.   Follow up plan: Telephone appointment with care management team member scheduled for:03/08/2020  [facility]

## 2020-03-01 NOTE — Telephone Encounter (Signed)
Stacy Campbell spoke to patient rescheduled for 03/08/2020. Thanks  [employer]

## 2020-03-08 ENCOUNTER — Ambulatory Visit (INDEPENDENT_AMBULATORY_CARE_PROVIDER_SITE_OTHER): Payer: Medicare HMO | Admitting: *Deleted

## 2020-03-08 ENCOUNTER — Other Ambulatory Visit: Payer: Self-pay | Admitting: Gastroenterology

## 2020-03-08 DIAGNOSIS — I10 Essential (primary) hypertension: Secondary | ICD-10-CM

## 2020-03-08 DIAGNOSIS — E119 Type 2 diabetes mellitus without complications: Secondary | ICD-10-CM

## 2020-03-08 DIAGNOSIS — K7469 Other cirrhosis of liver: Secondary | ICD-10-CM

## 2020-03-08 NOTE — Chronic Care Management (AMB) (Signed)
Chronic Care Management   CCM RN Visit Note  03/08/2020 Name: Stacy Campbell MRN: [id number] DOB: 02-Mar-1955  Subjective: Stacy Campbell is a 65 y.o. year old female who is a primary care patient of [name], MD. The care management team was consulted for assistance with disease management and care coordination needs.    Engaged with patient by telephone for initial visit in response to provider referral for case management and/or care coordination services.   Consent to Services:  The patient was given the following information about Chronic Care Management services today, agreed to services, and gave verbal consent: 1. CCM service includes personalized support from designated clinical staff supervised by the primary care provider, including individualized plan of care and coordination with other care providers 2. 24/7 contact phone numbers for assistance for urgent and routine care needs. 3. Service will only be billed when office clinical staff spend 20 minutes or more in a month to coordinate care. 4. Only one practitioner may furnish and bill the service in a calendar month. 5.The patient may stop CCM services at any time (effective at the end of the month) by phone call to the office staff. 6. The patient will be responsible for cost sharing (co-pay) of up to 20% of the service fee (after annual deductible is met). Patient agreed to services and consent obtained.  Patient agreed to services and verbal consent obtained.   Assessment: Review of patient past medical history, allergies, medications, health status, including review of consultants reports, laboratory and other test data, was performed as part of comprehensive evaluation and provision of chronic care management services.   SDOH (Social Determinants of Health) assessments and interventions performed:  SDOH Interventions   Flowsheet Row Most Recent Value  SDOH Interventions   Food Insecurity Interventions  NCCARE360 Referral  Financial Strain Interventions NCCARE360 Referral  Housing Interventions Intervention Not Indicated  Intimate Partner Violence Interventions Intervention Not Indicated  Physical Activity Interventions Intervention Not Indicated  Stress Interventions Other (Comment), Intervention Not Indicated  [recieves treatment currently from therapist and psychiatrist, declines CCM Social Work referral at this time]  Transportation Interventions Intervention Not Indicated  Depression Interventions/Treatment  Currently on Treatment  [declines CCM Social Work referral]       CCM Care Plan  Allergies  Allergen Reactions  . Hydrocodone Itching  . Linalool   . Methylisothiazolinone Other (See Comments)    Other reaction(s): Other (See Comments) Positive patch test Positive patch test   . Propylene Glycol   . Aspirin Itching and Other (See Comments)    Lower dose doesn't make her itch (she takes it every day)  . Eggs Or Egg-Derived Products Nausea Only and Other (See Comments)    No reaction if in another food  . Hydrocodone-Guaifenesin Rash    Outpatient Encounter Medications as of 03/08/2020  Medication Sig Note  . amLODipine (NORVASC) 10 MG tablet Take 1 tablet (10 mg total) by mouth daily.   . ARIPiprazole (ABILIFY) 5 MG tablet Take 1 tablet (5 mg total) by mouth daily.   . Aspirin-Salicylamide-Caffeine (BC HEADACHE PO) Take 1 packet by mouth daily as needed (headaches).   . busPIRone (BUSPAR) 5 MG tablet Take 5 mg by mouth 2 (two) times daily.   . carvedilol (COREG) 25 MG tablet Take 1 tablet (25 mg total) by mouth 2 (two) times daily with a meal.   . doxepin (SINEQUAN) 25 MG capsule Take 25 mg by mouth 3 (three) times daily as needed.   Marland Kitchen  empagliflozin (JARDIANCE) 10 MG TABS tablet Take 1 tablet (10 mg total) by mouth daily.   . fluconazole (DIFLUCAN) 200 MG tablet Take 200 mg by mouth once a week. Once a week for 12 weeks.   . Fluticasone Furoate (ARNUITY ELLIPTA) 100  MCG/ACT AEPB Inhale 1 puff into the lungs daily.   . meclizine (ANTIVERT) 25 MG tablet Take 1 tablet (25 mg total) by mouth 3 (three) times daily as needed for dizziness.   . metFORMIN (GLUCOPHAGE-XR) 500 MG 24 hr tablet TAKE 1 TABLET(500 MG) BY MOUTH DAILY WITH BREAKFAST   . mycophenolate (CELLCEPT) 500 MG tablet Take 1 tablet by mouth 2 (two) times daily. Written by derm   . promethazine (PHENERGAN) 25 MG tablet take 1 tablet by mouth every 8 hours if needed for nausea and vomiting   . rosuvastatin (CRESTOR) 10 MG tablet Take 1 tablet (10 mg total) by mouth daily.   . sertraline (ZOLOFT) 100 MG tablet TAKE 1 TABLET(100 MG) BY MOUTH AT BEDTIME   . tiZANidine (ZANAFLEX) 4 MG tablet Take 4 mg by mouth 2 (two) times daily.   Marland Kitchen triamcinolone ointment (KENALOG) 0.1 % Apply 1 application topically 2 (two) times daily.   . VENTOLIN HFA 108 (90 Base) MCG/ACT inhaler INHALE 2 PUFFS INTO THE LUNGS EVERY 6 HOURS AS NEEDED FOR WHEEZING OR SHORTNESS OF BREATH   . zolpidem (AMBIEN) 10 MG tablet Take 10 mg by mouth at bedtime.   Marland Kitchen EPINEPHrine 0.3 mg/0.3 mL IJ SOAJ injection Use as directed for severe allergic reaction (Patient not taking: Reported on 02/15/2020)   . glucose blood test strip Test blood sugar daily and record on blood sugar log   . Lancets (ACCU-CHEK SOFT TOUCH) lancets Check blood sugar daily and record in log   . lisinopril-hydrochlorothiazide (ZESTORETIC) 20-25 MG tablet Take 1 tablet by mouth daily. (Patient not taking: Reported on 03/08/2020) 03/08/2020: Reports no longer taking, states she thought medication was discontinued once Norvasc initiated.   No facility-administered encounter medications on file as of 03/08/2020.    Patient Active Problem List   Diagnosis Date Noted  . Stricture of ureter 01/04/2020  . Cirrhosis of liver ([place]) - h/o hep C 01/04/2020  . Degenerative joint disease (DJD) of lumbar spine 01/04/2020  . Monoclonal gammopathy of unknown significance (MGUS) 01/23/2019  .  Osteoporosis with current pathological fracture 01/21/2019  . Controlled type 2 diabetes mellitus without complication, without long-term current use of insulin ([place]) 12/12/2018  . Hyperlipidemia associated with type 2 diabetes mellitus ([place]) 12/12/2018  . Seasonal and perennial allergic rhinoconjunctivitis 02/12/2018  . History of hepatitis C 10/11/2017  . History of atrial fibrillation 10/11/2017  . Former smoker 10/11/2017  . Dyshidrotic eczema 09/10/2017  . GERD (gastroesophageal reflux disease) 02/15/2017  . Psoriasis 07/10/2015  . Asthma with COPD ([place]) 05/25/2013  . 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 Obesity, Class II, BMI 35-39.9 03/07/2006  . Major depression, recurrent, chronic ([place]) 03/07/2006  . Essential hypertension 03/07/2006  . Chronic low back pain 03/07/2006  . Insomnia 03/07/2006    Conditions to be addressed/monitored:HTN and DMII  Care Plan : Diabetes Type 2 (Adult)  Updates made by Leona Singleton, RN since 03/08/2020 12:00 AM  Problem: Patient will maintain Hgb A1C of below 7 within the next 90 days.   Priority: Medium  Long-Range Goal: Glycemic Management Optimized   Start Date: 03/08/2020  Expected End Date: 09/06/2020  Priority: Medium  Note:   Objective:  Lab Results  Component Value Date  HGBA1C 6.7 (A) 01/04/2020 .   Lab Results  Component Value Date   CREATININE 1.53 (H) 02/15/2020   CREATININE 0.80 01/04/2020   CREATININE 1.01 (H) 11/24/2018   Current Barriers:  Marland Kitchen Knowledge Deficits related to basic Diabetes pathophysiology and self care/management.  Congratulated patient on current Hgb A1C level of 6.7.   Reports she monitors blood sugars twice a day.  Fasting blood sugar this morning was 120 with recent ranges of 110-120's (mornings and afternoons).  Discussed with patient need for yearly eye exams and patient stated she would schedule one soon. . Difficulties managing chronic conditions related to pain and anxiety.  Attends pain management  clinic to manage chronic back pain. Reports pain limits activity and prevents her from spending time with her family.  Patient states she sees therapist and psychiatrist monthly for her anxiety and depression.  Reports occasional anxiety attacks and starting new medication that has not helped as of yet.  States she will discuss with psychiatrist at next appointment.  Denies any thoughts of harming self or others. Case Manager Clinical Goal(s):  . patient will demonstrate improved adherence to prescribed treatment plan for diabetes self care/management as evidenced by: daily monitoring and recording of CBG,  adherence to ADA/ carb modified diet, adherence to prescribed medication regimen, contacting provider for new or worsened symptoms or questions Interventions:  . Collaboration with [name], MD regarding development and update of comprehensive plan of care as evidenced by provider attestation and co-signature . Inter-disciplinary care team collaboration (see longitudinal plan of care) . Reviewed medications with patient and discussed importance of medication adherence . Discussed plans with patient for ongoing care management follow up and provided patient with direct contact information for care management team . Advised patient, providing education and rationale, to check cbg twice a day and record, calling primary care provider for findings outside established parameters.   . Use of blood glucose monitoring log promoted; sending 2022 [facility] Calendar to help with recording blood sugars . Offered and discussed CCM Social Worker referral for anxiety attacks and depression (patient declines at this time) . Encouraged to discuss pain limiting effects with pain management provider . Discussed and encouraged heart healthy carbohydrate modified diet/food options . Discussed importance of yearly diabetic eye exams and encouraged patient to schedule exam as soon as possible . Reviewed online [employer]  list of eye providers with patient and discussed importance of request diabetic eye exam along with regular exam . Encouraged patient to request eye exam (once complete) be faxed to primary care provider Patient Goals/Self-Care Activities: . Continue to check blood sugars twice a day . Enter blood sugar readings into daily log . Take the blood sugar log to all doctor visits  . Schedule Eye exam (specifying needing diabetic eye exam) Follow Up Plan: The care management team will reach out to the patient again over the next 20 business days.    Care Plan : Hypertension (Adult)  Updates made by Leona Singleton, RN since 03/08/2020 12:00 AM  Problem: Hypertension (Hypertension)   Priority: High  Long-Range Goal: Hypertension Monitored   Start Date: 03/08/2020  Expected End Date: 09/06/2020  Priority: High  Current Barriers:  Marland Kitchen Knowledge Deficits related to basic understanding of hypertension pathophysiology and self care management as evidenced by patient reporting she does not monitor blood pressures at home.  States she does not have machine to check blood pressures.  Patient also reporting she stopped taking Lisinopril/HCTZ once starting on Norvasc as she  thought that is what provider wanted her to do.  Does report she attended medical appointment last week and SBP 130 (does not remember bottom number) . Difficulties maintaining food in the home.  Reports having trouble affording food and often times about to run out of food before having money to buy more.  Does report she gets food stamps ($107) but states that is not enough to suffice. Nurse Case Manager Clinical Goal(s):  Marland Kitchen Over the next 30 days, patient will verbalize understanding of plan for hypertension management . Over the next 60 days, patient will demonstrate improved adherence to prescribed treatment plan for hypertension as evidenced by taking all medications as prescribed, monitoring and recording blood pressure as directed, adhering  to low sodium/DASH diet Interventions:  . Collaboration with [name], MD regarding development and update of comprehensive plan of care as evidenced by provider attestation and co-signature . Inter-disciplinary care team collaboration (see longitudinal plan of care) . Evaluation of current treatment plan related to hypertension self management and patient's adherence to plan as established by provider. . Provided education to patient re: stroke prevention, s/s of heart attack and stroke, DASH diet, complications of uncontrolled blood pressure . Reviewed medications with patient and discussed importance of compliance . CCM Pharmacy referral for medication review . Discussed plans with patient for ongoing care management follow up and provided patient with direct contact information for care management team . Advised patient, providing education and rationale, to monitor blood pressure daily and record, calling PCP for findings outside established parameters.  . Reviewed signs and symptoms of hypotension and hypertension, sending education on hypertension . Discussed with patient and placed referral for [facility] for food resources . Reviewed [employer] online catalog and discussed with patient how to request and obtain blood pressure cuff/machine from [facility] Catalog/over the counter benefits . Encouraged low salt diet Patient Goals/Self-Care Activities:  Over the next 30 days, patient will: . Call 211 when I need some help . Follow-up on any referrals for help I am given . Make a list of family or friends that I can call  . Work with Care Guide to develop list of food resources . Check blood pressure daily (once obtaining BP machine) . Write blood pressure results in a log  . Contact [employer] and order automatic blood pressure machine using over the counter benefit . Monitor for hypotension and hypertension Follow Up Plan: The care management team will reach out to the patient  again over the next 20 business days.       Plan:The care management team will reach out to the patient again over the next 20 business days.  Hubert Azure RN, MSN RN Care Management Coordinator  [facility] [phone] [email or web address]

## 2020-03-08 NOTE — Addendum Note (Signed)
Addended by: Hubert Azure D on: 03/08/2020 03:10 PM   Modules accepted: Orders

## 2020-03-08 NOTE — Patient Instructions (Addendum)
Visit Information  Nice Speaking with you today.  Please give me a call if you have questions or concerns at [phone].  I am placing referral for [facility] Guide to discuss food options/resources.  Also, placing referral for CCM Pharmacist to review your medications and discuss your blood pressure medications.  PATIENT GOALS:  Goals Addressed            This Visit's Progress   . (RNCM) Find Help in My Community       Timeframe:  Short-Term Goal Priority:  High Start Date:   03/08/20                          Expected End Date:   07/07/20                    Follow Up Date 03/22/20    . Call 211 when I need some help . Follow-up on any referrals for help I am given . Make a list of family or friends that I can call  . Work with [place] to develop list of food resources   Why is this important?    Knowing how and where to find help for yourself or family in your neighborhood and community is an important skill.   You will want to take some steps to learn how.    Notes:    Marland Kitchen (RNCM) Monitor and Manage My Blood Sugar-Diabetes Type 2       Timeframe:  Long-Range Goal Priority:  Medium Start Date:   03/08/20                          Expected End Date:   09/06/20                    Follow Up Date 03/22/20    . Continue to check blood sugars twice a day . Enter blood sugar readings into daily log . Take the blood sugar log to all doctor visits  . Schedule Eye exam (specifying needing diabetic eye exam)   Why is this important?    Checking your blood sugar at home helps to keep it from getting very high or very low.   Writing the results in a diary or log helps the doctor know how to care for you.   Your blood sugar log should have the time, date and the results.   Also, write down the amount of insulin or other medicine that you take.   Other information, like what you ate, exercise done and how you were feeling, will also be helpful.     Notes:    Marland Kitchen (RNCM) Track and  Manage My Blood Pressure-Hypertension       Timeframe:  Long-Range Goal Priority:  High Start Date:  03/08/20                           Expected End Date:   09/06/20                    Follow Up Date 03/22/20    . Check blood pressure daily (once obtaining BP machine) . Write blood pressure results in a log  . Contact [employer] and order automatic blood pressure machine using over the counter benefit . Monitor for hypotension and hypertension   Why is this important?  You won't feel high blood pressure, but it can still hurt your blood vessels.   High blood pressure can cause heart or kidney problems. It can also cause a stroke.   Making lifestyle changes like losing a little weight or eating less salt will help.   Checking your blood pressure at home and at different times of the day can help to control blood pressure.   If the doctor prescribes medicine remember to take it the way the doctor ordered.   Call the office if you cannot afford the medicine or if there are questions about it.     Notes:     Consent to CCM Services: Stacy Campbell was given information about Chronic Care Management services today including:  1. CCM service includes personalized support from designated clinical staff supervised by her physician, including individualized plan of care and coordination with other care providers 2. 24/7 contact phone numbers for assistance for urgent and routine care needs. 3. Service will only be billed when office clinical staff spend 20 minutes or more in a month to coordinate care. 4. Only one practitioner may furnish and bill the service in a calendar month. 5. The patient may stop CCM services at any time (effective at the end of the month) by phone call to the office staff. 6. The patient will be responsible for cost sharing (co-pay) of up to 20% of the service fee (after annual deductible is met).  Patient agreed to services and verbal consent obtained.   Patient  verbalizes understanding of instructions provided today and agrees to view in [place].   The care management team will reach out to the patient again over the next 20 business  days.   Stacy Azure RN, MSN RN Care Management Coordinator  [facility] [phone] [email or web address]   CLINICAL CARE PLAN: Patient Care Plan: Diabetes Type 2 (Adult)  Problem Identified: Patient will maintain Hgb A1C of below 7 within the next 90 days.   Priority: Medium  Long-Range Goal: Glycemic Management Optimized   Start Date: 03/08/2020  Expected End Date: 09/06/2020  Priority: Medium  Note:   Objective:  Lab Results  Component Value Date   HGBA1C 6.7 (A) 01/04/2020 .   Lab Results  Component Value Date   CREATININE 1.53 (H) 02/15/2020   CREATININE 0.80 01/04/2020   CREATININE 1.01 (H) 11/24/2018   Current Barriers:  Marland Kitchen Knowledge Deficits related to basic Diabetes pathophysiology and self care/management.  Congratulated patient on current Hgb A1C level of 6.7.   Reports she monitors blood sugars twice a day.  Fasting blood sugar this morning was 120 with recent ranges of 110-120's (mornings and afternoons).  Discussed with patient need for yearly eye exams and patient stated she would schedule one soon. . Difficulties managing chronic conditions related to pain and anxiety.  Attends pain management clinic to manage chronic back pain. Reports pain limits activity and prevents her from spending time with her family.  Patient states she sees therapist and psychiatrist monthly for her anxiety and depression.  Reports occasional anxiety attacks and starting new medication that has not helped as of yet.  States she will discuss with psychiatrist at next appointment.  Denies any thoughts of harming self or others. Case Manager Clinical Goal(s):  . patient will demonstrate improved adherence to prescribed treatment plan for diabetes self care/management as evidenced by:  daily monitoring and recording of CBG,  adherence to ADA/ carb modified diet, adherence to prescribed medication regimen, contacting provider for  new or worsened symptoms or questions Interventions:  . Collaboration with [name], MD regarding development and update of comprehensive plan of care as evidenced by provider attestation and co-signature . Inter-disciplinary care team collaboration (see longitudinal plan of care) . Reviewed medications with patient and discussed importance of medication adherence . Discussed plans with patient for ongoing care management follow up and provided patient with direct contact information for care management team . Advised patient, providing education and rationale, to check cbg twice a day and record, calling primary care provider for findings outside established parameters.   . Use of blood glucose monitoring log promoted; sending 2022 [facility] Calendar to help with recording blood sugars . Offered and discussed CCM Social Worker referral for anxiety attacks and depression (patient declines at this time) . Encouraged to discuss pain limiting effects with pain management provider . Discussed and encouraged heart healthy carbohydrate modified diet/food options . Discussed importance of yearly diabetic eye exams and encouraged patient to schedule exam as soon as possible . Reviewed online [employer] list of eye providers with patient and discussed importance of request diabetic eye exam along with regular exam . Encouraged patient to request eye exam (once complete) be faxed to primary care provider Patient Goals/Self-Care Activities: . Continue to check blood sugars twice a day . Enter blood sugar readings into daily log . Take the blood sugar log to all doctor visits  . Schedule Eye exam (specifying needing diabetic eye exam) Follow Up Plan: The care management team will reach out to the patient again over the next 20 business days.    Patient Care Plan:  Hypertension (Adult)  Problem Identified: Hypertension (Hypertension)   Priority: High  Long-Range Goal: Hypertension Monitored   Start Date: 03/08/2020  Expected End Date: 09/06/2020  Priority: High  Current Barriers:  Marland Kitchen Knowledge Deficits related to basic understanding of hypertension pathophysiology and self care management as evidenced by patient reporting she does not monitor blood pressures at home.  States she does not have machine to check blood pressures.  Patient also reporting she stopped taking Lisinopril/HCTZ once starting on Norvasc as she thought that is what provider wanted her to do.  Does report she attended medical appointment last week and SBP 130 (does not remember bottom number) . Difficulties maintaining food in the home.  Reports having trouble affording food and often times about to run out of food before having money to buy more.  Does report she gets food stamps ($107) but states that is not enough to suffice. Nurse Case Manager Clinical Goal(s):  Marland Kitchen Over the next 30 days, patient will verbalize understanding of plan for hypertension management . Over the next 60 days, patient will demonstrate improved adherence to prescribed treatment plan for hypertension as evidenced by taking all medications as prescribed, monitoring and recording blood pressure as directed, adhering to low sodium/DASH diet Interventions:  . Collaboration with [name], MD regarding development and update of comprehensive plan of care as evidenced by provider attestation and co-signature . Inter-disciplinary care team collaboration (see longitudinal plan of care) . Evaluation of current treatment plan related to hypertension self management and patient's adherence to plan as established by provider. . Provided education to patient re: stroke prevention, s/s of heart attack and stroke, DASH diet, complications of uncontrolled blood pressure . Reviewed medications with patient and discussed  importance of compliance . CCM Pharmacy referral for medication review . Discussed plans with patient for ongoing care management follow up and provided patient  with direct contact information for care management team . Advised patient, providing education and rationale, to monitor blood pressure daily and record, calling PCP for findings outside established parameters.  . Reviewed signs and symptoms of hypotension and hypertension, sending education on hypertension . Discussed with patient and placed referral for [place] for food resources . Reviewed [employer] online catalog and discussed with patient how to request and obtain blood pressure cuff/machine from [facility] Catalog/over the counter benefits . Encouraged low salt diet Patient Goals/Self-Care Activities:  Over the next 30 days, patient will: . Call 211 when I need some help . Follow-up on any referrals for help I am given . Make a list of family or friends that I can call  . Work with Care Guide to develop list of food resources . Check blood pressure daily (once obtaining BP machine) . Write blood pressure results in a log  . Contact [employer] and order automatic blood pressure machine using over the counter benefit . Monitor for hypotension and hypertension Follow Up Plan: The care management team will reach out to the patient again over the next 20 business days.        Hypertension, Adult Hypertension is another name for high blood pressure. High blood pressure forces your heart to work harder to pump blood. This can cause problems over time. There are two numbers in a blood pressure reading. There is a top number (systolic) over a bottom number (diastolic). It is best to have a blood pressure that is below 120/80. Healthy choices can help lower your blood pressure, or you may need medicine to help lower it. What are the causes? The cause of this condition is not known. Some conditions may be related to high blood  pressure. What increases the risk?  Smoking.  Having type 2 diabetes mellitus, high cholesterol, or both.  Not getting enough exercise or physical activity.  Being overweight.  Having too much fat, sugar, calories, or salt (sodium) in your diet.  Drinking too much alcohol.  Having long-term (chronic) kidney disease.  Having a family history of high blood pressure.  Age. Risk increases with age.  Race. You may be at higher risk if you are African American.  Gender. Men are at higher risk than women before age 43. After age 4, women are at higher risk than men.  Having obstructive sleep apnea.  Stress. What are the signs or symptoms?  High blood pressure may not cause symptoms. Very high blood pressure (hypertensive crisis) may cause: ? Headache. ? Feelings of worry or nervousness (anxiety). ? Shortness of breath. ? Nosebleed. ? A feeling of being sick to your stomach (nausea). ? Throwing up (vomiting). ? Changes in how you see. ? Very bad chest pain. ? Seizures. How is this treated?  This condition is treated by making healthy lifestyle changes, such as: ? Eating healthy foods. ? Exercising more. ? Drinking less alcohol.  Your health care provider may prescribe medicine if lifestyle changes are not enough to get your blood pressure under control, and if: ? Your top number is above 130. ? Your bottom number is above 80.  Your personal target blood pressure may vary. Follow these instructions at home: Eating and drinking  If told, follow the DASH eating plan. To follow this plan: ? Fill one half of your plate at each meal with fruits and vegetables. ? Fill one fourth of your plate at each meal with whole grains. Whole grains include whole-wheat pasta, brown rice,  and whole-grain bread. ? Eat or drink low-fat dairy products, such as skim milk or low-fat yogurt. ? Fill one fourth of your plate at each meal with low-fat (lean) proteins. Low-fat proteins include  fish, chicken without skin, eggs, beans, and tofu. ? Avoid fatty meat, cured and processed meat, or chicken with skin. ? Avoid pre-made or processed food.  Eat less than 1,500 mg of salt each day.  Do not drink alcohol if: ? Your doctor tells you not to drink. ? You are pregnant, may be pregnant, or are planning to become pregnant.  If you drink alcohol: ? Limit how much you use to:  0-1 drink a day for women.  0-2 drinks a day for men. ? Be aware of how much alcohol is in your drink. In the [place] one drink equals one 12 oz bottle of beer (355 mL), one 5 oz glass of wine (148 mL), or one 1 oz glass of hard liquor (44 mL).   Lifestyle  Work with your doctor to stay at a healthy weight or to lose weight. Ask your doctor what the best weight is for you.  Get at least 30 minutes of exercise most days of the week. This may include walking, swimming, or biking.  Get at least 30 minutes of exercise that strengthens your muscles (resistance exercise) at least 3 days a week. This may include lifting weights or doing Pilates.  Do not use any products that contain nicotine or tobacco, such as cigarettes, e-cigarettes, and chewing tobacco. If you need help quitting, ask your doctor.  Check your blood pressure at home as told by your doctor.  Keep all follow-up visits as told by your doctor. This is important.   Medicines  Take over-the-counter and prescription medicines only as told by your doctor. Follow directions carefully.  Do not skip doses of blood pressure medicine. The medicine does not work as well if you skip doses. Skipping doses also puts you at risk for problems.  Ask your doctor about side effects or reactions to medicines that you should watch for. Contact a doctor if you:  Think you are having a reaction to the medicine you are taking.  Have headaches that keep coming back (recurring).  Feel dizzy.  Have swelling in your ankles.  Have trouble with your vision. Get  help right away if you:  Get a very bad headache.  Start to feel mixed up (confused).  Feel weak or numb.  Feel faint.  Have very bad pain in your: ? Chest. ? Belly (abdomen).  Throw up more than once.  Have trouble breathing. Summary  Hypertension is another name for high blood pressure.  High blood pressure forces your heart to work harder to pump blood.  For most people, a normal blood pressure is less than 120/80.  Making healthy choices can help lower blood pressure. If your blood pressure does not get lower with healthy choices, you may need to take medicine. This information is not intended to replace advice given to you by your health care provider. Make sure you discuss any questions you have with your health care provider. Document Revised: 09/04/2017 Document Reviewed: 09/04/2017 Elsevier Patient Education  2021 [place].  Hypotension As your heart beats, it forces blood through your body. This force is called blood pressure. If you have hypotension, you have low blood pressure. When your blood pressure is too low, you may not get enough blood to your brain or other parts of your body.  This may cause you to feel weak, light-headed, have a fast heartbeat, or even pass out (faint). Low blood pressure may be harmless, or it may cause serious problems. What are the causes?  Blood loss.  Not enough water in the body (dehydration).  Heart problems.  Hormone problems.  Pregnancy.  A very bad infection.  Not having enough of certain nutrients.  Very bad allergic reactions.  Certain medicines. What increases the risk?  Age. The risk increases as you get older.  Conditions that affect the heart or the brain and spinal cord (central nervous system).  Taking certain medicines.  Being pregnant. What are the signs or symptoms?  Feeling: ? Weak. ? Light-headed. ? Dizzy. ? Tired (fatigued).  Blurred vision.  Fast heartbeat.  Passing out, in very  bad cases. How is this treated?  Changing your diet. This may involve eating more salt (sodium) or drinking more water.  Taking medicines to raise your blood pressure.  Changing how much you take (the dosage) of some of your medicines.  Wearing compression stockings. These stockings help to prevent blood clots and reduce swelling in your legs. In some cases, you may need to go to the hospital for:  Fluid replacement. This means you will receive fluids through an IV tube.  Blood replacement. This means you will receive donated blood through an IV tube (transfusion).  Treating an infection or heart problems, if this applies.  Monitoring. You may need to be monitored while medicines that you are taking wear off. Follow these instructions at home: Eating and drinking  Drink enough fluids to keep your pee (urine) pale yellow.  Eat a healthy diet. Follow instructions from your doctor about what you can eat or drink. A healthy diet includes: ? Fresh fruits and vegetables. ? Whole grains. ? Low-fat (lean) meats. ? Low-fat dairy products.  Eat extra salt only as told. Do not add extra salt to your diet unless your doctor tells you to.  Eat small meals often.  Avoid standing up quickly after you eat.   Medicines  Take over-the-counter and prescription medicines only as told by your doctor. ? Follow instructions from your doctor about changing how much you take of your medicines, if this applies. ? Do not stop or change any of your medicines on your own. General instructions  Wear compression stockings as told by your doctor.  Get up slowly from lying down or sitting.  Avoid hot showers and a lot of heat as told by your doctor.  Return to your normal activities as told by your doctor. Ask what activities are safe for you.  Do not use any products that contain nicotine or tobacco, such as cigarettes, e-cigarettes, and chewing tobacco. If you need help quitting, ask your  doctor.  Keep all follow-up visits as told by your doctor. This is important.   Contact a doctor if:  You throw up (vomit).  You have watery poop (diarrhea).  You have a fever for more than 2-3 days.  You feel more thirsty than normal.  You feel weak and tired. Get help right away if:  You have chest pain.  You have a fast or uneven heartbeat.  You lose feeling (have numbness) in any part of your body.  You cannot move your arms or your legs.  You have trouble talking.  You get sweaty or feel light-headed.  You pass out.  You have trouble breathing.  You have trouble staying awake.  You feel mixed up (  confused). Summary  Hypotension is also called low blood pressure. It is when the force of blood pumping through your arteries is too weak.  Hypotension may be harmless, or it may cause serious problems.  Treatment may include changing your diet and medicines, and wearing compression stockings.  In very bad cases, you may need to go to the hospital. This information is not intended to replace advice given to you by your health care provider. Make sure you discuss any questions you have with your health care provider. Document Revised: 06/20/2017 Document Reviewed: 06/20/2017 Elsevier Patient Education  [place].

## 2020-03-09 ENCOUNTER — Ambulatory Visit: Payer: Medicare HMO

## 2020-03-09 ENCOUNTER — Ambulatory Visit
Admission: RE | Admit: 2020-03-09 | Discharge: 2020-03-09 | Disposition: A | Discharge status: Home / Self Care | Payer: Medicare HMO | Source: Ambulatory Visit | Attending: Gastroenterology | Admitting: Gastroenterology

## 2020-03-09 DIAGNOSIS — E119 Type 2 diabetes mellitus without complications: Secondary | ICD-10-CM

## 2020-03-09 DIAGNOSIS — I10 Essential (primary) hypertension: Secondary | ICD-10-CM | POA: Diagnosis not present

## 2020-03-09 DIAGNOSIS — K7469 Other cirrhosis of liver: Secondary | ICD-10-CM

## 2020-03-09 NOTE — Progress Notes (Signed)
Chronic Care Management Pharmacy Note  03/09/2020 Name:  Stacy Campbell MRN:  [id number] DOB:  [birth year]  Subjective: Stacy Campbell is an 65 y.o. year old female who is a primary patient of [name], MD.  The CCM team was consulted for assistance with disease management and care coordination needs.   Engaged with patient by telephone for initial visit in response to provider referral for pharmacy case management and/or care coordination services.   Consent to Services:  The patient was given information about Chronic Care Management services, agreed to services, and gave verbal consent prior to initiation of services.  Please see initial visit note for detailed documentation.  Patient Care Team: [name], MD as PCP - General (Family Medicine) Arta Silence, MD as Consulting Physician (Gastroenterology) Linward Headland, NP as Nurse Practitioner (Psychology) Leona Singleton, RN as Case Manager    Objective: Lab Results  Component Value Date   CREATININE 1.53 (H) 02/15/2020   BUN 27 (H) 02/15/2020   GFR 35.72 (L) 02/15/2020   GFRNONAA 59 (L) 11/24/2018   GFRAA 68 11/24/2018   NA 139 02/15/2020   K 4.0 02/15/2020   CALCIUM 9.6 02/15/2020   CO2 28 02/15/2020   Lab Results  Component Value Date/Time   HGBA1C 6.7 (A) 01/04/2020 10:09 AM   HGBA1C 6.8 08/14/2019 08:38 AM   HGBA1C 9.8 (A) 11/24/2018 02:50 PM   GFR 35.72 (L) 02/15/2020 10:41 AM   GFR 77.84 01/04/2020 10:46 AM   MICROALBUR <0.7 01/04/2020 10:46 AM    Last diabetic Eye exam: No results found for: HMDIABEYEEXA  Last diabetic Foot exam: No results found for: HMDIABFOOTEX  Lab Results  Component Value Date   CHOL 140 02/15/2020   HDL 37.50 (L) 02/15/2020   LDLCALC 73 02/15/2020   TRIG 148.0 02/15/2020   CHOLHDL 4 02/15/2020   Hepatic Function Latest Ref Rng & Units 02/15/2020 01/04/2020 11/24/2018  Total Protein 6.0 - 8.3 g/dL 7.5 7.6 7.4  Albumin 3.5 - 5.2 g/dL 3.9 4.0 4.2   AST 0 - 37 U/L 25 30 50(H)  ALT 0 - 35 U/L 20 24 72(H)  Alk Phosphatase 39 - 117 U/L 56 52 93  Total Bilirubin 0.2 - 1.2 mg/dL 0.3 0.3 <0.2  Bilirubin, Direct 0.00 - 0.40 mg/dL - - -   Lab Results  Component Value Date/Time   TSH 3.34 01/04/2020 10:46 AM   TSH 1.960 01/15/2018 10:49 AM   TSH 2.73 02/08/2016 03:23 PM   FREET4 0.98 07/30/2007 08:40 PM   CBC Latest Ref Rng & Units 01/04/2020 11/24/2018 01/16/2018  WBC 4.0 - 10.5 K/uL 5.5 7.4 7.8  Hemoglobin 12.0 - 15.0 g/dL 13.0 13.6 14.8  Hematocrit 36.0 - 46.0 % 41.6 43.5 48.5(H)  Platelets 150.0 - 400.0 K/uL 197.0 229 281   Lab Results  Component Value Date/Time   VD25OH 20 (L) 05/10/2010 04:34 PM   VD25OH <10 ng/mL (L) 04/06/2009 08:40 PM    Clinical ASCVD:  The 10-year ASCVD risk score Mikey Bussing DC Jr., et al., 2013) is: 23%   Values used to calculate the score:     Age: 57 years     Sex: Female     Is Non-Hispanic African American: Yes     Diabetic: Yes     Tobacco smoker: No     Systolic Blood Pressure: 245 mmHg     Is BP treated: Yes     HDL Cholesterol: 37.5 mg/dL     Total Cholesterol:  140 mg/dL    Depression screen [facility] 2/9 03/08/2020 01/04/2020 08/28/2019  Decreased Interest '3 3 1  ' Down, Depressed, Hopeless '1 3 1  ' PHQ - 2 Score '4 6 2  ' Altered sleeping '1 1 3  ' Tired, decreased energy '2 3 1  ' Change in appetite 0 2 1  Feeling bad or failure about yourself  1 1 0  Trouble concentrating 0 0 0  Moving slowly or fidgety/restless 0 0 0  Suicidal thoughts 0 0 0  PHQ-9 Score '8 13 7  ' Difficult doing work/chores Somewhat difficult - Extremely dIfficult  Some recent data might be hidden    Social History   Tobacco Use  Smoking Status Former Smoker  . Packs/day: 0.25  . Years: 37.00  . Pack years: 9.25  . Types: Cigarettes  . Quit date: 03/30/2008  . Years since quitting: 11.9  Smokeless Tobacco Never Used   BP Readings from Last 3 Encounters:  02/15/20 (!) 150/82  01/04/20 (!) 152/80  08/28/19 136/80   Pulse  Readings from Last 3 Encounters:  02/15/20 84  01/04/20 90  08/28/19 83   Wt Readings from Last 3 Encounters:  02/15/20 218 lb 12.8 oz (99.2 kg)  01/04/20 216 lb 6.4 oz (98.2 kg)  08/28/19 214 lb (97.1 kg)   Assessment/Interventions: Review of patient past medical history, allergies, medications, health status, including review of consultants reports, laboratory and other test data, was performed as part of comprehensive evaluation and provision of chronic care management services.   SDOH:  (Social Determinants of Health) assessments and interventions performed: Yes  CCM Care Plan Allergies  Allergen Reactions  . Hydrocodone Itching  . Linalool   . Methylisothiazolinone Other (See Comments)    Other reaction(s): Other (See Comments) Positive patch test Positive patch test   . Propylene Glycol   . Aspirin Itching and Other (See Comments)    Lower dose doesn't make her itch (she takes it every day)  . Eggs Or Egg-Derived Products Nausea Only and Other (See Comments)    No reaction if in another food  . Hydrocodone-Guaifenesin Rash   Medications Reviewed Today    Reviewed by Leona Singleton, RN (Registered Nurse) on 03/08/20 at 1413  Med List Status: <None>  Medication Order Taking? Sig Documenting Provider Last Dose Status Informant  amLODipine (NORVASC) 10 MG tablet [id number] Yes Take 1 tablet (10 mg total) by mouth daily. [name], MD Taking Active   ARIPiprazole (ABILIFY) 5 MG tablet [id number] Yes Take 1 tablet (5 mg total) by mouth daily. Sherene Sires, DO Taking Active   Aspirin-Salicylamide-Caffeine [facility] HEADACHE PO) [id number] Yes Take 1 packet by mouth daily as needed (headaches). [provider] Taking Active Self  busPIRone (BUSPAR) 5 MG tablet [id number] Yes Take 5 mg by mouth 2 (two) times daily. [provider] Taking Active   carvedilol (COREG) 25 MG tablet [id number] Yes Take 1 tablet (25 mg total) by mouth 2 (two) times daily with a meal.  [name], MD Taking Active   doxepin (SINEQUAN) 25 MG capsule [id number] Yes Take 25 mg by mouth 3 (three) times daily as needed. [provider] Taking Active   empagliflozin (JARDIANCE) 10 MG TABS tablet [id number] Yes Take 1 tablet (10 mg total) by mouth daily. [name], MD Taking Active   EPINEPHrine 0.3 mg/0.3 mL IJ SOAJ injection [id number]  Use as directed for severe allergic reaction  Patient not taking: Reported on 02/15/2020   Garnet Sierras, DO  Active  fluconazole (DIFLUCAN) 200 MG tablet [id number] Yes Take 200 mg by mouth once a week. Once a week for 12 weeks. [provider] Taking Active   Fluticasone Furoate (ARNUITY ELLIPTA) 100 MCG/ACT AEPB [id number] Yes Inhale 1 puff into the lungs daily. [name], MD Taking Active   glucose blood test strip [id number]  Test blood sugar daily and record on blood sugar log Sherene Sires, DO  Active   Lancets (ACCU-CHEK SOFT TOUCH) lancets [id number]  Check blood sugar daily and record in log Bland, Scott, DO  Active   lisinopril-hydrochlorothiazide (ZESTORETIC) 20-25 MG tablet [id number] No Take 1 tablet by mouth daily.  Patient not taking: Reported on 03/08/2020   [name], MD Not Taking Active            Med Note Shelby Mattocks, [facility] D   Tue Mar 08, 2020  2:13 PM) Reports no longer taking, states she thought medication was discontinued once Norvasc initiated.  meclizine (ANTIVERT) 25 MG tablet [id number] Yes Take 1 tablet (25 mg total) by mouth 3 (three) times daily as needed for dizziness. Lattie Haw, MD Taking Active   metFORMIN (GLUCOPHAGE-XR) 500 MG 24 hr tablet [id number] Yes TAKE 1 TABLET(500 MG) BY MOUTH DAILY WITH BREAKFAST Lattie Haw, MD Taking Active   mycophenolate (CELLCEPT) 500 MG tablet [id number] Yes Take 1 tablet by mouth 2 (two) times daily. Written by derm [provider] Taking Active   promethazine (PHENERGAN) 25 MG tablet [id number] Yes take 1 tablet by mouth every 8 hours if needed for  nausea and vomiting Sherene Sires, DO Taking Active Self  rosuvastatin (CRESTOR) 10 MG tablet [id number] Yes Take 1 tablet (10 mg total) by mouth daily. [name], MD Taking Active   sertraline (ZOLOFT) 100 MG tablet [id number] Yes TAKE 1 TABLET(100 MG) BY MOUTH AT BEDTIME Lattie Haw, MD Taking Active   tiZANidine (ZANAFLEX) 4 MG tablet [id number] Yes Take 4 mg by mouth 2 (two) times daily. [provider] Taking Active Self  triamcinolone ointment (KENALOG) 0.1 % [id number] Yes Apply 1 application topically 2 (two) times daily. [provider] Taking Active Self  VENTOLIN HFA 108 (90 Base) MCG/ACT inhaler [id number] Yes INHALE 2 PUFFS INTO THE LUNGS EVERY 6 HOURS AS NEEDED FOR WHEEZING OR SHORTNESS OF Lowella Curb, Scott, DO Taking Active   zolpidem (AMBIEN) 10 MG tablet [id number] Yes Take 10 mg by mouth at bedtime. [provider] Taking Active          Patient Active Problem List   Diagnosis Date Noted  . Stricture of ureter 01/04/2020  . Cirrhosis of liver ([place]) - h/o hep C 01/04/2020  . Degenerative joint disease (DJD) of lumbar spine 01/04/2020  . Monoclonal gammopathy of unknown significance (MGUS) 01/23/2019  . Osteoporosis with current pathological fracture 01/21/2019  . Controlled type 2 diabetes mellitus without complication, without long-term current use of insulin ([place]) 12/12/2018  . Hyperlipidemia associated with type 2 diabetes mellitus ([place]) 12/12/2018  . Seasonal and perennial allergic rhinoconjunctivitis 02/12/2018  . History of hepatitis C 10/11/2017  . History of atrial fibrillation 10/11/2017  . Former smoker 10/11/2017  . Dyshidrotic eczema 09/10/2017  . GERD (gastroesophageal reflux disease) 02/15/2017  . Psoriasis 07/10/2015  . Asthma with COPD ([place]) 05/25/2013  . 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 Obesity, Class II, BMI 35-39.9 03/07/2006  . Major depression, recurrent, chronic ([place]) 03/07/2006  . Essential hypertension 03/07/2006  .  Chronic low back pain 03/07/2006  . Insomnia 03/07/2006   Immunization History  Administered Date(s) Administered  . Hepatitis B, adult 10/12/2014, 11/09/2014, 04/11/2015  . Influenza Split 11/14/2010, 10/09/2011  . Influenza Whole 10/13/2007, 01/10/2009, 11/22/2009  . Influenza,inj,Quad PF,6+ Mos 10/10/2012, 10/08/2013, 10/15/2014, 09/29/2015, 11/06/2016, 09/10/2017, 10/15/2018, 09/30/2019  . PFIZER(Purple Top)SARS-COV-2 Vaccination 03/20/2019, 04/15/2019, 10/15/2019  . Pneumococcal Polysaccharide-23 01/10/2009  . Td 01/09/1999, 01/10/2009    Conditions to be addressed/monitored:  Hypertension, Hyperlipidemia, Diabetes, Atrial Fibrillation, GERD, COPD, Chronic Kidney Disease, Hypothyroidism, Anxiety and Osteoporosis There are no care plans that you recently modified to display for this patient.   Medication Assistance: None required at this time, will discuss further at f/u Patient's preferred pharmacy is:  [facility] [place], [place] - [address] [place] [address] Phone: [phone] Fax: [phone]  Patient agrees to Care Plan and Follow-up.  Current Barriers:  . Suboptimal therapeutic regimen for HTN  Pharmacist Clinical Goal(s):  Marland Kitchen Over the next 365 days, patient will verbalize ability to afford treatment regimen . achieve adherence to monitoring guidelines and medication adherence to achieve therapeutic efficacy . contact provider office for questions/concerns as evidenced notation of same in electronic health record through collaboration with PharmD and provider.   Interventions: . 1:1 collaboration with [name], MD regarding development and update of comprehensive plan of care as evidenced by provider attestation and co-signature . Inter-disciplinary care team collaboration (see longitudinal plan of care) . Comprehensive medication review performed; medication list updated  in electronic medical record  Hypertension (BP goal <140/90) -Not ideally controlled  -bilateral renal stenosis <60% noted on 2017 [place] -scr increased from 0.80 (01/04/2020) to 1.53 (02/15/2020)  -baseline scr avg 0.92 12/2019 11/2018 02/2018 -lisinopril 10 mg and HCTZ 25 mg changed to lisinopril-HCTZ 20-25 mg 01/04/2020 -also on Farxiga 10 mg daily, frequency of NSAID use unclear   -Current treatment: . Lisinopril-HCTZ 20-25 mg (due to confusion patient had help this following last OV) . Amlodipine 10 mg once daily (started 02/15/2020) . Carvedilol 25 mg twice daily  -Current home readings: has ordered BP cuff from [facility], expecting to get in the mail this week and will start testing as soon as she does -Denies hypotensive/hypertensive symptoms -Educated on BP goals and benefits of medications for prevention of heart attack, stroke and kidney damage; Daily salt intake goal < 2300 mg; Exercise goal of 150 minutes per week; Importance of home blood pressure monitoring; Proper BP monitoring technique; Symptoms of hypotension and importance of maintaining adequate hydration; -Counseled to monitor BP at home twice daily, document, and provide log at future appointments -Counseled on diet and exercise extensively will discuss continued hold of HCTZ/ACEi with PCP and as well as lab/bp check and restarting hctz as appropriate  Diabetes (A1c goal <7%) -Controlled -Current medications: . Farxiga 10 mg once daily . Metformin 500 mg once daily  -Current home glucose readings . fasting glucose: 100-120s -Denies hypoglycemic/hyperglycemic symptoms -Current meal patterns:  Will discuss at f/u -Current exercise: Will discuss at f/u -Educated onA1c and blood sugar goals; Complications of diabetes including kidney damage, retinal damage, and cardiovascular disease; -Counseled to check feet daily and get yearly eye exams -Counseled on diet and exercise extensively Recommended to continue current  medication  Patient Goals/Self-Care Activities . Over the next 365 days, patient will:  - take medications as prescribed target a minimum of 150 minutes of moderate intensity exercise weekly  Future Appointments  Date Time Provider [place]  03/22/2020 10:00 AM [facility] HPC-CCM  CARE MGR [facility] PEC  04/29/2020  8:10 AM GI-BCG MM 3 GI-BCGMM GI-BREAST CE  05/06/2020 10:00 AM [name], MD [facility] PEC   Follow-up plan with Care Management Team: . CPA: tbd . RPH: f/u visit tbd   Madelin Rear, Pharm.D., BCGP Clinical Pharmacist [place] [phone]

## 2020-03-10 ENCOUNTER — Telehealth: Payer: Self-pay

## 2020-03-10 NOTE — Telephone Encounter (Signed)
   Telephone encounter was:  Successful.  03/10/2020 Name: MARIAH GERSTENBERGER MRN: [id number] DOB: [birth year]  [name] is a 65 y.o. year old female who is a primary care patient of [name], MD . The community resource team was consulted for assistance with [place] guide performed the following interventions: Patient provided with information about care guide support team and interviewed to confirm resource needs Investigation of community resources performed Spoke with patient about food banks verified email address [email or web address] sent resources. Patient sent response that she received email and does not need further assistance..  Follow Up Plan:  No further follow up planned at this time. The patient has been provided with needed resources.  Jestina Stephani, AAS Paralegal, [place] . Embedded Care Coordination [facility]  Care Management  [address], [place] [address] ??[email or web address]  ?? [phone]   [email or web address]

## 2020-03-10 NOTE — Telephone Encounter (Cosign Needed)
Hi Dr Jonni Sanger!  I wanted to get your thoughts on scr increase from 0.80 (01/04/2020) to 1.53 (02/15/2020).   During 01/04/2020 office visit lisinopril-HCTZ had been increased from 10-25 to 20-25 mg. Then during 02/15/2020 OV amlodipine 10 mg added and pt had stopped lisinopril-hctz due to her feeling like she was on too many BP medications.   I was wondering if you would be ok with having pt come in this week for lab/BP check and then possibly restarting hctz 25 mg alone (rather than combination hctz-lisinopril)?  Thanks! Edison Nasuti

## 2020-03-11 NOTE — Telephone Encounter (Signed)
Thank. No, I'd like her to continue the same meds. She should restart the medication and I will recheck at next visit.  thanks

## 2020-03-14 NOTE — Telephone Encounter (Signed)
Pt informed. Also was recently was sent a BP cuff from her insurance. Will start testing daily at home and update [place] with readings within next couple weeks or if she has any side effects.

## 2020-03-22 ENCOUNTER — Ambulatory Visit: Payer: Medicare HMO | Admitting: *Deleted

## 2020-03-22 ENCOUNTER — Telehealth: Payer: Self-pay

## 2020-03-22 ENCOUNTER — Ambulatory Visit: Payer: Self-pay

## 2020-03-22 DIAGNOSIS — E119 Type 2 diabetes mellitus without complications: Secondary | ICD-10-CM | POA: Diagnosis not present

## 2020-03-22 DIAGNOSIS — I10 Essential (primary) hypertension: Secondary | ICD-10-CM | POA: Diagnosis not present

## 2020-03-22 NOTE — Patient Instructions (Signed)
Visit Information  PATIENT GOALS: Goals Addressed            This Visit's Progress   . (RNCM) Find Help in My Community   On track    Timeframe:  Short-Term Goal Priority:  High Start Date:   03/08/20                          Expected End Date:   07/07/20                    Follow Up Date 04/19/20    . Call 211 when I need some help . Follow-up on any referrals for help I am given . Make a list of family or friends that I can call  . Review list of agencies to assist with food given by the Care Guide   Why is this important?    Knowing how and where to find help for yourself or family in your neighborhood and community is an important skill.   You will want to take some steps to learn how.    Notes:     Marland Kitchen (RNCM) Monitor and Manage My Blood Sugar-Diabetes Type 2   On track    Timeframe:  Long-Range Goal Priority:  Medium Start Date:   03/08/20                          Expected End Date:   09/06/20                    Follow Up Date 04/19/20    . Continue to check blood sugars twice a day . Enter blood sugar readings into daily log . Take the blood sugar log to all doctor visits  . Attend Eye exam (specifying needing diabetic eye exam) scheduled on 04/14/20   Why is this important?    Checking your blood sugar at home helps to keep it from getting very high or very low.   Writing the results in a diary or log helps the doctor know how to care for you.   Your blood sugar log should have the time, date and the results.   Also, write down the amount of insulin or other medicine that you take.   Other information, like what you ate, exercise done and how you were feeling, will also be helpful.     Notes:     Marland Kitchen [facility]) Track and Manage My Blood Pressure-Hypertension   On track    Timeframe:  Long-Range Goal Priority:  High Start Date:  03/08/20                           Expected End Date:   09/06/20                    Follow Up Date 04/19/20    . Check blood pressure daily   . Write blood pressure results in a log  . Monitor yourself for hypotension and hypertension . Follow low salt carbohydrate modified diabetic diet   Why is this important?    You won't feel high blood pressure, but it can still hurt your blood vessels.   High blood pressure can cause heart or kidney problems. It can also cause a stroke.   Making lifestyle changes like losing a little weight or eating less salt will help.  Checking your blood pressure at home and at different times of the day can help to control blood pressure.   If the doctor prescribes medicine remember to take it the way the doctor ordered.   Call the office if you cannot afford the medicine or if there are questions about it.     Notes:        Patient verbalizes understanding of instructions provided today and agrees to view in [place].   The care management team will reach out to the patient again over the next 30 business days.   Hubert Azure RN, MSN RN Care Management Coordinator  [facility] [phone] [email or web address]

## 2020-03-22 NOTE — Progress Notes (Signed)
Chronic Care Management Pharmacy Note  03/22/2020 Name:  Stacy Campbell MRN:  [id number] DOB:  10-06-1955  Subjective: Stacy Campbell is an 65 y.o. year old female who is a primary patient of Stacy Arnt, MD.  The CCM team was consulted for assistance with disease management and care coordination needs.    Engaged with patient by telephone for refill request in response to provider referral for pharmacy case management and/or care coordination services.   Consent to Services:  The patient was given information about Chronic Care Management services, agreed to services, and gave verbal consent prior to initiation of services.  Please see initial visit note for detailed documentation.   Patient Care Team: Stacy Arnt, MD as PCP - General (Family Medicine) Arta Silence, MD as Consulting Physician (Gastroenterology) Linward Headland, NP as Nurse Practitioner (Psychology) Leona Singleton, RN as Case Manager Madelin Rear, [facility] as Pharmacist (Pharmacist)  Hospital visits: None in previous 6 months  Objective: Lab Results  Component Value Date   CREATININE 1.53 (H) 02/15/2020   CREATININE 0.80 01/04/2020   CREATININE 1.01 (H) 11/24/2018   GFR 35.72 (L) 02/15/2020   GFR 77.84 01/04/2020   GFRNONAA 59 (L) 11/24/2018   GFRNONAA 65 02/12/2018  Last diabetic Eye exam: No results found for: HMDIABEYEEXA  Last diabetic Foot exam: No results found for: HMDIABFOOTEX  Lab Results  Component Value Date   CHOL 140 02/15/2020   CHOL 174 01/04/2020   TRIG 148.0 02/15/2020   TRIG 159.0 (H) 01/04/2020   HDL 37.50 (L) 02/15/2020   HDL 38.10 (L) 01/04/2020   CHOLHDL 4 02/15/2020   CHOLHDL 5 01/04/2020   VLDL 29.6 02/15/2020   VLDL 31.8 01/04/2020   LDLCALC 73 02/15/2020   LDLCALC 104 (H) 01/04/2020   Hepatic Function Latest Ref Rng & Units 02/15/2020 01/04/2020 11/24/2018  Total Protein 6.0 - 8.3 g/dL 7.5 7.6 7.4  Albumin 3.5 - 5.2 g/dL 3.9 4.0 4.2  AST 0 - 37  U/L 25 30 50(H)  ALT 0 - 35 U/L 20 24 72(H)  Alk Phosphatase 39 - 117 U/L 56 52 93  Total Bilirubin 0.2 - 1.2 mg/dL 0.3 0.3 <0.2  Bilirubin, Direct 0.00 - 0.40 mg/dL - - -   Lab Results  Component Value Date/Time   TSH 3.34 01/04/2020 10:46 AM   TSH 1.960 01/15/2018 10:49 AM   TSH 2.73 02/08/2016 03:23 PM   FREET4 0.98 07/30/2007 08:40 PM   CBC Latest Ref Rng & Units 01/04/2020 11/24/2018 01/16/2018  WBC 4.0 - 10.5 K/uL 5.5 7.4 7.8  Hemoglobin 12.0 - 15.0 g/dL 13.0 13.6 14.8  Hematocrit 36.0 - 46.0 % 41.6 43.5 48.5(H)  Platelets 150.0 - 400.0 K/uL 197.0 229 281   Lab Results  Component Value Date/Time   VD25OH 20 (L) 05/10/2010 04:34 PM   VD25OH <10 ng/mL (L) 04/06/2009 08:40 PM    Clinical ASCVD:  The 10-year ASCVD risk score Mikey Bussing DC Jr., et al., 2013) is: 23%   Values used to calculate the score:     Age: 12 years     Sex: Female     Is Non-Hispanic African American: Yes     Diabetic: Yes     Tobacco smoker: No     Systolic Blood Pressure: 917 mmHg     Is BP treated: Yes     HDL Cholesterol: 37.5 mg/dL     Total Cholesterol: 140 mg/dL     Social History   Tobacco Use  Smoking  Status Former Smoker  . Packs/day: 0.25  . Years: 37.00  . Pack years: 9.25  . Types: Cigarettes  . Quit date: 03/30/2008  . Years since quitting: 11.9  Smokeless Tobacco Never Used   BP Readings from Last 3 Encounters:  02/15/20 (!) 150/82  01/04/20 (!) 152/80  08/28/19 136/80   Pulse Readings from Last 3 Encounters:  02/15/20 84  01/04/20 90  08/28/19 83   Wt Readings from Last 3 Encounters:  02/15/20 218 lb 12.8 oz (99.2 kg)  01/04/20 216 lb 6.4 oz (98.2 kg)  08/28/19 214 lb (97.1 kg)    Assessment: Review of patient past medical history, allergies, medications, health status, including review of consultants reports, laboratory and other test data, was performed as part of comprehensive evaluation and provision of chronic care management services.   SDOH:  (Social  Determinants of Health) assessments and interventions performed:   CCM Care Plan Allergies  Allergen Reactions  . Hydrocodone Itching  . Linalool   . Methylisothiazolinone Other (See Comments)    Other reaction(s): Other (See Comments) Positive patch test Positive patch test   . Propylene Glycol   . Aspirin Itching and Other (See Comments)    Lower dose doesn't make her itch (she takes it every day)  . Eggs Or Egg-Derived Products Nausea Only and Other (See Comments)    No reaction if in another food  . Hydrocodone-Guaifenesin Rash   Medications Reviewed Today    Reviewed by Leona Singleton, RN (Registered Nurse) on 03/22/20 at 103  Med List Status: <None>  Medication Order Taking? Sig Documenting Provider Last Dose Status Informant  amLODipine (NORVASC) 10 MG tablet [id number] Yes Take 1 tablet (10 mg total) by mouth daily. Stacy Arnt, MD Taking Active   ARIPiprazole (ABILIFY) 5 MG tablet [id number]  Take 1 tablet (5 mg total) by mouth daily. Sherene Sires, DO  Active   Aspirin-Salicylamide-Caffeine [facility] HEADACHE PO) [id number]  Take 1 packet by mouth daily as needed (headaches). [provider]  Active Self  busPIRone (BUSPAR) 5 MG tablet [id number]  Take 5 mg by mouth 2 (two) times daily. [provider]  Active   carvedilol (COREG) 25 MG tablet [id number]  Take 1 tablet (25 mg total) by mouth 2 (two) times daily with a meal. Stacy Arnt, MD  Active   doxepin (SINEQUAN) 25 MG capsule [id number]  Take 25 mg by mouth 3 (three) times daily as needed. [provider]  Active   empagliflozin (JARDIANCE) 10 MG TABS tablet [id number]  Take 1 tablet (10 mg total) by mouth daily. Stacy Arnt, MD  Active   EPINEPHrine 0.3 mg/0.3 mL IJ SOAJ injection [id number]  Use as directed for severe allergic reaction  Patient not taking: Reported on 02/15/2020   Garnet Sierras, DO  Active   fluconazole (DIFLUCAN) 200 MG tablet [id number]  Take 200 mg by mouth once a week. Once  a week for 12 weeks. [provider]  Active   Fluticasone Furoate (ARNUITY ELLIPTA) 100 MCG/ACT AEPB [id number]  Inhale 1 puff into the lungs daily. Stacy Arnt, MD  Active   glucose blood test strip [id number]  Test blood sugar daily and record on blood sugar log Sherene Sires, DO  Active   Lancets (ACCU-CHEK SOFT TOUCH) lancets [id number]  Check blood sugar daily and record in log Bland, Scott, DO  Active   lisinopril-hydrochlorothiazide (ZESTORETIC) 20-25 MG tablet [id number] Yes Take 1 tablet by mouth daily. Stacy Arnt, MD  Taking Active            Med Note Hubert Azure D   Tue Mar 22, 2020 10:07 AM) Resumed on 03/14/2020 per patient  meclizine (ANTIVERT) 25 MG tablet [id number]  Take 1 tablet (25 mg total) by mouth 3 (three) times daily as needed for dizziness. Lattie Haw, MD  Active   metFORMIN (GLUCOPHAGE-XR) 500 MG 24 hr tablet [id number]  TAKE 1 TABLET(500 MG) BY MOUTH DAILY WITH BREAKFAST Lattie Haw, MD  Active   mycophenolate (CELLCEPT) 500 MG tablet [id number]  Take 1 tablet by mouth 2 (two) times daily. Written by derm [provider]  Active   promethazine (PHENERGAN) 25 MG tablet [id number]  take 1 tablet by mouth every 8 hours if needed for nausea and vomiting Sherene Sires, DO  Active Self  rosuvastatin (CRESTOR) 10 MG tablet [id number]  Take 1 tablet (10 mg total) by mouth daily. Stacy Arnt, MD  Active   sertraline (ZOLOFT) 100 MG tablet [id number]  TAKE 1 TABLET(100 MG) BY MOUTH AT BEDTIME Lattie Haw, MD  Active   tiZANidine (ZANAFLEX) 4 MG tablet [id number]  Take 4 mg by mouth 2 (two) times daily. [provider]  Active Self  triamcinolone ointment (KENALOG) 0.1 % [id number]  Apply 1 application topically 2 (two) times daily. [provider]  Active Self  VENTOLIN HFA 108 (90 Base) MCG/ACT inhaler [id number]  INHALE 2 PUFFS INTO THE LUNGS EVERY 6 HOURS AS NEEDED FOR WHEEZING OR SHORTNESS OF BREATH Criss Rosales, Scott, DO  Active   zolpidem  (AMBIEN) 10 MG tablet [id number]  Take 10 mg by mouth at bedtime. [provider]  Active          Patient Active Problem List   Diagnosis Date Noted  . Stricture of ureter 01/04/2020  . Cirrhosis of liver ([place]) - h/o hep C 01/04/2020  . Degenerative joint disease (DJD) of lumbar spine 01/04/2020  . Monoclonal gammopathy of unknown significance (MGUS) 01/23/2019  . Osteoporosis with current pathological fracture 01/21/2019  . Controlled type 2 diabetes mellitus without complication, without long-term current use of insulin ([place]) 12/12/2018  . Hyperlipidemia associated with type 2 diabetes mellitus ([place]) 12/12/2018  . Seasonal and perennial allergic rhinoconjunctivitis 02/12/2018  . History of hepatitis C 10/11/2017  . History of atrial fibrillation 10/11/2017  . Former smoker 10/11/2017  . Dyshidrotic eczema 09/10/2017  . GERD (gastroesophageal reflux disease) 02/15/2017  . Psoriasis 07/10/2015  . Asthma with COPD ([place]) 05/25/2013  . 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 Obesity, Class II, BMI 35-39.9 03/07/2006  . Major depression, recurrent, chronic ([place]) 03/07/2006  . Essential hypertension 03/07/2006  . Chronic low back pain 03/07/2006  . Insomnia 03/07/2006   Immunization History  Administered Date(s) Administered  . Hepatitis B, adult 10/12/2014, 11/09/2014, 04/11/2015  . Influenza Split 11/14/2010, 10/09/2011  . Influenza Whole 10/13/2007, 01/10/2009, 11/22/2009  . Influenza,inj,Quad PF,6+ Mos 10/10/2012, 10/08/2013, 10/15/2014, 09/29/2015, 11/06/2016, 09/10/2017, 10/15/2018, 09/30/2019  . PFIZER(Purple Top)SARS-COV-2 Vaccination 03/20/2019, 04/15/2019, 10/15/2019  . Pneumococcal Polysaccharide-23 01/10/2009  . Td 01/09/1999, 01/10/2009   Conditions to be addressed/monitored: Hypertension, Hyperlipidemia, Diabetes, Atrial Fibrillation, GERD, COPD, Chronic Kidney Disease, Hypothyroidism, Anxiety and Osteoporosis  Care Plan : [place]  Updates made by  Madelin Rear, [facility] since 03/22/2020 12:00 AM    Problem: CHL AMB "PATIENT-SPECIFIC PROBLEM"     Long-Range Goal: Patient-Specific Goal   Start Date: 03/09/2020  Expected End Date: 03/22/2021  This Visit's Progress: On track  Priority: High  Note:  Hypertension (BP goal <140/90) -Not ideally controlled  -bilateral renal stenosis <60% noted on 2017 [place] -scr increased from 0.80 (01/04/2020) to 1.53 (02/15/2020)  -baseline scr avg 0.92 12/2019 11/2018 02/2018 -lisinopril 10 mg and HCTZ 25 mg changed to lisinopril-HCTZ 20-25 mg 01/04/2020 -also on Farxiga 10 mg daily, frequency of NSAID use unclear   -Current treatment:  Lisinopril-HCTZ 20-25 mg (due to confusion patient had help this following last OV)  Amlodipine 10 mg once daily (started 02/15/2020)  Carvedilol 25 mg twice daily  -Current home readings: has ordered BP cuff from [facility], expecting to get in the mail this week and will start testing as soon as she does -Denies hypotensive/hypertensive symptoms -Educated on BP goals and benefits of medications for prevention of heart attack, stroke and kidney damage; Daily salt intake goal < 2300 mg; Exercise goal of 150 minutes per week; Importance of home blood pressure monitoring; Proper BP monitoring technique; Symptoms of hypotension and importance of maintaining adequate hydration; -Counseled to monitor BP at home twice daily, document, and provide log at future appointments -Counseled on diet and exercise extensively will discuss continued hold of HCTZ/ACEi with PCP and as well as lab/bp check and restarting hctz as appropriate   Diabetes (A1c goal <7%) -Controlled -Current medications:  Farxiga 10 mg once daily  Metformin 500 mg once daily  -Current home glucose readings  fasting glucose: 100-120s -Denies hypoglycemic/hyperglycemic symptoms -Current meal patterns:  Will discuss at f/u -Current exercise: Will discuss at f/u -Educated onA1c and blood sugar  goals; Complications of diabetes including kidney damage, retinal damage, and cardiovascular disease; -Counseled to check feet daily and get yearly eye exams -Counseled on diet and exercise extensively Recommended to continue current medication   Patient Goals/Self-Care Activities  Over the next 365 days, patient will:  - take medications as prescribed target a minimum of 150 minutes of moderate intensity exercise weekly      03/22/2020 - Medication Assistance: pt requesting help with medication refills - test strips/lancets refill request rejected by previous pcp. Pt requesting refills lancets and test strips to be sent to below pharmacy. Discussed enhanced pharmacy services with patient. Agreed to further review at our f/u visit.   Patient's preferred pharmacy is:  [facility] [phone] - [place], [place] - [address] [place] [address] Phone: [phone] Fax: [phone]  Follow Up:  Patient agrees to Care Plan and Follow-up. Plan: Telephone follow up appointment with care management team member scheduled for:  04/2020. [place] vist scheduled for 05/2020. Future Appointments  Date Time Provider [place]  04/19/2020 10:00 AM [facility]-CCM CARE [facility] P[facility]  04/29/2020  8:10 AM GI-BCG MM 3 GI-BCGMM GI-BREAST CE  05/06/2020 10:00 AM Stacy Arnt, MD [facility] P[facility]  05/09/2020  2:00 PM [facility] CCM PHARMACIST [facility]   Madelin Rear, Pharm.D., BCGP Clinical Pharmacist [place] [phone]

## 2020-03-22 NOTE — Patient Instructions (Addendum)
   Hypertension (BP goal <140/90) -Not ideally controlled  -bilateral renal stenosis <60% noted on 2017 [place] -scr increased from 0.80 (01/04/2020) to 1.53 (02/15/2020)  -baseline scr avg 0.92 12/2019 11/2018 02/2018 -lisinopril 10 mg and HCTZ 25 mg changed to lisinopril-HCTZ 20-25 mg 01/04/2020 -also on Farxiga 10 mg daily, frequency of NSAID use unclear   -Current treatment:  Lisinopril-HCTZ 20-25 mg (due to confusion patient had help this following last OV)  Amlodipine 10 mg once daily (started 02/15/2020)  Carvedilol 25 mg twice daily  -Current home readings: has ordered BP cuff from [facility], expecting to get in the mail this week and will start testing as soon as she does -Denies hypotensive/hypertensive symptoms -Educated on BP goals and benefits of medications for prevention of heart attack, stroke and kidney damage; Daily salt intake goal < 2300 mg; Exercise goal of 150 minutes per week; Importance of home blood pressure monitoring; Proper BP monitoring technique; Symptoms of hypotension and importance of maintaining adequate hydration; -Counseled to monitor BP at home twice daily, document, and provide log at future appointments -Counseled on diet and exercise extensively will discuss continued hold of HCTZ/ACEi with PCP and as well as lab/bp check and restarting hctz as appropriate  Diabetes (A1c goal <7%) -Controlled -Current medications:  Farxiga 10 mg once daily  Metformin 500 mg once daily  -Current home glucose readings  fasting glucose: 100-120s -Denies hypoglycemic/hyperglycemic symptoms -Current meal patterns:  Will discuss at f/u -Current exercise: Will discuss at f/u -Educated onA1c and blood sugar goals; Complications of diabetes including kidney damage, retinal damage, and cardiovascular disease; -Counseled to check feet daily and get yearly eye exams -Counseled on diet and exercise extensively Recommended to continue current medication  Patient  Goals/Self-Care Activities  Over the next 365 days, patient will:  - take medications as prescribed target a minimum of 150 minutes of moderate intensity exercise weekly    The patient verbalized understanding of instructions provided today and agreed to receive a MyChart copy of patient instruction and/or educational materials. Telephone follow up appointment with pharmacy team member scheduled for: See next appointment with "Care Management Staff" under "What's Next" below.

## 2020-03-22 NOTE — Chronic Care Management (AMB) (Signed)
Chronic Care Management   CCM RN Visit Note  03/22/2020 Name: Stacy Campbell MRN: [id number] DOB: 26-Feb-1955  Subjective: Stacy Campbell is a 65 y.o. year old female who is a primary care patient of [name], MD. The care management team was consulted for assistance with disease management and care coordination needs.    Engaged with patient by telephone for follow up visit in response to provider referral for case management and/or care coordination services.   Consent to Services:  The patient was given information about Chronic Care Management services, agreed to services, and gave verbal consent prior to initiation of services.  Please see initial visit note for detailed documentation.   Patient agreed to services and verbal consent obtained.   Assessment: Review of patient past medical history, allergies, medications, health status, including review of consultants reports, laboratory and other test data, was performed as part of comprehensive evaluation and provision of chronic care management services.   SDOH (Social Determinants of Health) assessments and interventions performed:    CCM Care Plan  Allergies  Allergen Reactions  . Hydrocodone Itching  . Linalool   . Methylisothiazolinone Other (See Comments)    Other reaction(s): Other (See Comments) Positive patch test Positive patch test   . Propylene Glycol   . Aspirin Itching and Other (See Comments)    Lower dose doesn't make her itch (she takes it every day)  . Eggs Or Egg-Derived Products Nausea Only and Other (See Comments)    No reaction if in another food  . Hydrocodone-Guaifenesin Rash    Outpatient Encounter Medications as of 03/22/2020  Medication Sig Note  . amLODipine (NORVASC) 10 MG tablet Take 1 tablet (10 mg total) by mouth daily.   Marland Kitchen lisinopril-hydrochlorothiazide (ZESTORETIC) 20-25 MG tablet Take 1 tablet by mouth daily. 03/22/2020: Resumed on 03/14/2020 per patient  . ARIPiprazole  (ABILIFY) 5 MG tablet Take 1 tablet (5 mg total) by mouth daily.   . Aspirin-Salicylamide-Caffeine (BC HEADACHE PO) Take 1 packet by mouth daily as needed (headaches).   . busPIRone (BUSPAR) 5 MG tablet Take 5 mg by mouth 2 (two) times daily.   . carvedilol (COREG) 25 MG tablet Take 1 tablet (25 mg total) by mouth 2 (two) times daily with a meal.   . doxepin (SINEQUAN) 25 MG capsule Take 25 mg by mouth 3 (three) times daily as needed.   . empagliflozin (JARDIANCE) 10 MG TABS tablet Take 1 tablet (10 mg total) by mouth daily.   Marland Kitchen EPINEPHrine 0.3 mg/0.3 mL IJ SOAJ injection Use as directed for severe allergic reaction (Patient not taking: Reported on 02/15/2020)   . fluconazole (DIFLUCAN) 200 MG tablet Take 200 mg by mouth once a week. Once a week for 12 weeks.   . Fluticasone Furoate (ARNUITY ELLIPTA) 100 MCG/ACT AEPB Inhale 1 puff into the lungs daily.   Marland Kitchen glucose blood test strip Test blood sugar daily and record on blood sugar log   . Lancets (ACCU-CHEK SOFT TOUCH) lancets Check blood sugar daily and record in log   . meclizine (ANTIVERT) 25 MG tablet Take 1 tablet (25 mg total) by mouth 3 (three) times daily as needed for dizziness.   . metFORMIN (GLUCOPHAGE-XR) 500 MG 24 hr tablet TAKE 1 TABLET(500 MG) BY MOUTH DAILY WITH BREAKFAST   . mycophenolate (CELLCEPT) 500 MG tablet Take 1 tablet by mouth 2 (two) times daily. Written by derm   . promethazine (PHENERGAN) 25 MG tablet take 1 tablet by mouth every 8  hours if needed for nausea and vomiting   . rosuvastatin (CRESTOR) 10 MG tablet Take 1 tablet (10 mg total) by mouth daily.   . sertraline (ZOLOFT) 100 MG tablet TAKE 1 TABLET(100 MG) BY MOUTH AT BEDTIME   . tiZANidine (ZANAFLEX) 4 MG tablet Take 4 mg by mouth 2 (two) times daily.   Marland Kitchen triamcinolone ointment (KENALOG) 0.1 % Apply 1 application topically 2 (two) times daily.   . VENTOLIN HFA 108 (90 Base) MCG/ACT inhaler INHALE 2 PUFFS INTO THE LUNGS EVERY 6 HOURS AS NEEDED FOR WHEEZING OR  SHORTNESS OF BREATH   . zolpidem (AMBIEN) 10 MG tablet Take 10 mg by mouth at bedtime.    No facility-administered encounter medications on file as of 03/22/2020.    Patient Active Problem List   Diagnosis Date Noted  . Stricture of ureter 01/04/2020  . Cirrhosis of liver ([place]) - h/o hep C 01/04/2020  . Degenerative joint disease (DJD) of lumbar spine 01/04/2020  . Monoclonal gammopathy of unknown significance (MGUS) 01/23/2019  . Osteoporosis with current pathological fracture 01/21/2019  . Controlled type 2 diabetes mellitus without complication, without long-term current use of insulin ([place]) 12/12/2018  . Hyperlipidemia associated with type 2 diabetes mellitus ([place]) 12/12/2018  . Seasonal and perennial allergic rhinoconjunctivitis 02/12/2018  . History of hepatitis C 10/11/2017  . History of atrial fibrillation 10/11/2017  . Former smoker 10/11/2017  . Dyshidrotic eczema 09/10/2017  . GERD (gastroesophageal reflux disease) 02/15/2017  . Psoriasis 07/10/2015  . Asthma with COPD ([place]) 05/25/2013  . 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 Obesity, Class II, BMI 35-39.9 03/07/2006  . Major depression, recurrent, chronic ([place]) 03/07/2006  . Essential hypertension 03/07/2006  . Chronic low back pain 03/07/2006  . Insomnia 03/07/2006    Conditions to be addressed/monitored:HTN and DMII  Care Plan : Diabetes Type 2 (Adult)  Updates made by Leona Singleton, RN since 03/22/2020 12:00 AM  Problem: Patient will maintain Hgb A1C of below 7 within the next 90 days.   Priority: Medium  Long-Range Goal: Glycemic Management Optimized   Start Date: 03/08/2020  Expected End Date: 09/06/2020  This Visit's Progress: On track  Priority: Medium  Objective:  Lab Results  Component Value Date   HGBA1C 6.7 (A) 01/04/2020 .   Lab Results  Component Value Date   CREATININE 1.53 (H) 02/15/2020   CREATININE 0.80 01/04/2020   CREATININE 1.01 (H) 11/24/2018   Current Barriers:  Marland Kitchen Knowledge Deficits  related to basic Diabetes pathophysiology and self care/management.  Patient's current Hgb A1C level of 6.7.   Continues to monitor blood sugars twice a day.  Fasting blood sugar this morning was 131 with recent ranges of 120-130's (mornings and afternoons).  States she has scheduled her diabetic eye exam for 04/14/2020. Marland Kitchen Difficulties managing chronic conditions related to pain and anxiety.  Attends pain management clinic to manage chronic back pain. Reports pain limits activity and prevents her from spending time with her family.  Patient states she sees therapist and psychiatrist monthly for her anxiety and depression.  Reports occasional anxiety attacks and starting new medication that has not helped as of yet.  States she will discuss with psychiatrist at next appointment.   Case Manager Clinical Goal(s):  Marland Kitchen Patient will demonstrate improved adherence to prescribed treatment plan for diabetes self care/management as evidenced by: daily monitoring and recording of CBG,  adherence to ADA/ carb modified diet, adherence to prescribed medication regimen, contacting provider for new or worsened symptoms or questions Interventions:  .  Collaboration with [name], MD regarding development and update of comprehensive plan of care as evidenced by provider attestation and co-signature . Inter-disciplinary care team collaboration (see longitudinal plan of care) . Reviewed medications with patient and discussed importance of medication adherence . Discussed plans with patient for ongoing care management follow up and provided patient with direct contact information for care management team . Advised patient, providing education and rationale, to check cbg twice a day and record, calling primary care provider for findings outside established parameters.   . Use of blood glucose monitoring log promoted; encouraged to use 2022 [facility] Calendar to help with recording blood sugars (verified patient has  booklet) . Discussed patient needs refills for lancets and strips. Discussed her contacting pharmacy to send referral request to Dr. Jonni Sanger; [facility] sent Dr. Jonni Sanger message requesting refill. . Encouraged to discuss pain limiting effects with pain management provider . Discussed and encouraged heart healthy carbohydrate modified diet/food options . Encouraged patient to attend scheduled eye exam on 04/14/2020 . Encouraged patient to request eye exam (once complete) be faxed to primary care provider Patient Goals/Self-Care Activities: . Continue to check blood sugars twice a day . Enter blood sugar readings into daily log . Take the blood sugar log to all doctor visits  . Attend Eye exam (specifying needing diabetic eye exam) scheduled on 04/14/20 Follow Up Plan: The care management team will reach out to the patient again over the next 30 business days.    Care Plan : Hypertension (Adult)  Updates made by Leona Singleton, RN since 03/22/2020 12:00 AM  Problem: Hypertension (Hypertension)   Priority: High  Long-Range Goal: Hypertension Monitored   Start Date: 03/08/2020  Expected End Date: 09/06/2020  This Visit's Progress: On track  Priority: High  Current Barriers:  Marland Kitchen Knowledge Deficits related to basic understanding of hypertension pathophysiology and self care management.  Reports obtaining blood pressure meter for home and states she has began to monitor her pressures at home.  Blood pressures have ranged 130/80's.  Patient stating after speaking with CCM Pharmacist she has resumed Lisinopril/HCTZ without any difficulties.  Denies any light headiness or dizziness at this time. . Difficulties maintaining food in the home.  Reports having trouble affording food and often times about to run out of food before having money to buy more.  Has spoken with care guide and has received list of possible food banks, she needs to review Nurse Case Manager Clinical Goal(s):  Marland Kitchen Over the next 30 days, patient will  verbalize understanding of plan for hypertension management . Over the next 60 days, patient will demonstrate improved adherence to prescribed treatment plan for hypertension as evidenced by taking all medications as prescribed, monitoring and recording blood pressure as directed, adhering to low sodium/DASH diet Interventions:  . Collaboration with [name], MD regarding development and update of comprehensive plan of care as evidenced by provider attestation and co-signature . Inter-disciplinary care team collaboration (see longitudinal plan of care) . Evaluation of current treatment plan related to hypertension self management and patient's adherence to plan as established by provider. . Reviewed medications with patient and discussed importance of compliance . Encouraged patient to contact CCM Pharmacist or RNCM for any questions or concerns . Discussed plans with patient for ongoing care management follow up and provided patient with direct contact information for care management team . Advised patient, providing education and rationale, to monitor blood pressure daily and record, calling PCP for findings outside established parameters.  Marland Kitchen  Reviewed signs and symptoms of hypotension and hypertension; encouraged patient to review previous education sent . Encouraged patient to review list of food banks provided by [facility] Guides . Congratulated patient on obtaining blood pressure meter and encouraged her to use Calendar Booklet to log blood pressure readings for provider review . Encouraged low salt carbohydrate modified diabetic diet Patient Goals/Self-Care Activities:  Over the next 30 days, patient will: . Call 211 when I need some help . Follow-up on any referrals for help I am given . Make a list of family or friends that I can call  . Review list of agencies to assist with food given by the Care Guide . Check blood pressure daily  . Write blood pressure results in a log   . Monitor yourself for hypotension and hypertension . Follow low salt carbohydrate modified diabetic diet Follow Up Plan: The care management team will reach out to the patient again over the next 30 business days.       Plan:The care management team will reach out to the patient again over the next 30 business days.  Hubert Azure RN, MSN RN Care Management Coordinator  [place] [phone] [email or web address]

## 2020-03-23 ENCOUNTER — Other Ambulatory Visit: Payer: Self-pay

## 2020-03-23 DIAGNOSIS — E119 Type 2 diabetes mellitus without complications: Secondary | ICD-10-CM

## 2020-03-30 NOTE — Patient Instructions (Addendum)
Stacy Campbell,  Thank you for taking the time to review your medications with me today.  I have included our care plan/goals in the following pages. Please review and call me at [phone] with any questions!  Thanks! [name], Pharm.D., BCGP Clinical Pharmacist [facility] [place] [phone] Patient Care Plan: Diabetes Type 2 (Adult)    Problem Identified: Patient will maintain Hgb A1C of below 7 within the next 90 days.   Priority: Medium    Long-Range Goal: Glycemic Management Optimized   Start Date: 03/08/2020  Expected End Date: 09/06/2020  This Visit's Progress: On track  Priority: Medium  Note:   Objective:  Lab Results  Component Value Date   HGBA1C 6.7 (A) 01/04/2020 .   Lab Results  Component Value Date   CREATININE 1.53 (H) 02/15/2020   CREATININE 0.80 01/04/2020   CREATININE 1.01 (H) 11/24/2018   Current Barriers:  Marland Kitchen Knowledge Deficits related to basic Diabetes pathophysiology and self care/management.  Patient's current Hgb A1C level of 6.7.   Continues to monitor blood sugars twice a day.  Fasting blood sugar this morning was 131 with recent ranges of 120-130's (mornings and afternoons).  States she has scheduled her diabetic eye exam for 04/14/2020. Marland Kitchen Difficulties managing chronic conditions related to pain and anxiety.  Attends pain management clinic to manage chronic back pain. Reports pain limits activity and prevents her from spending time with her family.  Patient states she sees therapist and psychiatrist monthly for her anxiety and depression.  Reports occasional anxiety attacks and starting new medication that has not helped as of yet.  States she will discuss with psychiatrist at next appointment.   Case Manager Clinical Goal(s):  Marland Kitchen Patient will demonstrate improved adherence to prescribed treatment plan for diabetes self care/management as evidenced by: daily monitoring and recording of CBG,   adherence to ADA/ carb modified diet, adherence to prescribed medication regimen, contacting provider for new or worsened symptoms or questions Interventions:  . Collaboration with [name], MD regarding development and update of comprehensive plan of care as evidenced by provider attestation and co-signature . Inter-disciplinary care team collaboration (see longitudinal plan of care) . Reviewed medications with patient and discussed importance of medication adherence . Discussed plans with patient for ongoing care management follow up and provided patient with direct contact information for care management team . Advised patient, providing education and rationale, to check cbg twice a day and record, calling primary care provider for findings outside established parameters.   . Use of blood glucose monitoring log promoted; encouraged to use 2022 [facility] Calendar to help with recording blood sugars (verified patient has booklet) . Discussed patient needs refills for lancets and strips. Discussed her contacting pharmacy to send referral request to Dr. Jonni Sanger; [facility] sent Dr. Jonni Sanger message requesting refill. . Encouraged to discuss pain limiting effects with pain management provider . Discussed and encouraged heart healthy carbohydrate modified diet/food options . Encouraged patient to attend scheduled eye exam on 04/14/2020 . Encouraged patient to request eye exam (once complete) be faxed to primary care provider Patient Goals/Self-Care Activities: . Continue to check blood sugars twice a day . Enter blood sugar readings into daily log . Take the blood sugar log to all doctor visits  . Attend Eye exam (specifying needing diabetic eye exam) scheduled on 04/14/20 Follow Up Plan: The care management team will reach out to the patient again over the next 30 business days.  Patient Care Plan: Hypertension (Adult)    Problem Identified: Hypertension (Hypertension)   Priority: High    Long-Range  Goal: Hypertension Monitored   Start Date: 03/08/2020  Expected End Date: 09/06/2020  This Visit's Progress: On track  Priority: High  Note:   Current Barriers:  Marland Kitchen Knowledge Deficits related to basic understanding of hypertension pathophysiology and self care management.  Reports obtaining blood pressure meter for home and states she has began to monitor her pressures at home.  Blood pressures have ranged 130/80's.  Patient stating after speaking with CCM Pharmacist she has resumed Lisinopril/HCTZ without any difficulties.  Denies any light headiness or dizziness at this time. . Difficulties maintaining food in the home.  Reports having trouble affording food and often times about to run out of food before having money to buy more.  Has spoken with care guide and has received list of possible food banks, she needs to review Nurse Case Manager Clinical Goal(s):  Marland Kitchen Over the next 30 days, patient will verbalize understanding of plan for hypertension management . Over the next 60 days, patient will demonstrate improved adherence to prescribed treatment plan for hypertension as evidenced by taking all medications as prescribed, monitoring and recording blood pressure as directed, adhering to low sodium/DASH diet Interventions:  . Collaboration with [name], MD regarding development and update of comprehensive plan of care as evidenced by provider attestation and co-signature . Inter-disciplinary care team collaboration (see longitudinal plan of care) . Evaluation of current treatment plan related to hypertension self management and patient's adherence to plan as established by provider. . Reviewed medications with patient and discussed importance of compliance . Encouraged patient to contact CCM Pharmacist or RNCM for any questions or concerns . Discussed plans with patient for ongoing care management follow up and provided patient with direct contact information for care management team . Advised  patient, providing education and rationale, to monitor blood pressure daily and record, calling PCP for findings outside established parameters.  . Reviewed signs and symptoms of hypotension and hypertension; encouraged patient to review previous education sent . Encouraged patient to review list of food banks provided by [facility] Guides . Congratulated patient on obtaining blood pressure meter and encouraged her to use Calendar Booklet to log blood pressure readings for provider review . Encouraged low salt carbohydrate modified diabetic diet Patient Goals/Self-Care Activities:  Over the next 30 days, patient will: . Call 211 when I need some help . Follow-up on any referrals for help I am given . Make a list of family or friends that I can call  . Review list of agencies to assist with food given by the Care Guide . Check blood pressure daily  . Write blood pressure results in a log  . Monitor yourself for hypotension and hypertension . Follow low salt carbohydrate modified diabetic diet Follow Up Plan: The care management team will reach out to the patient again over the next 30 business days.       Patient Care Plan: CCM Pharmacy Care Plan    Problem Identified: CHL AMB "PATIENT-SPECIFIC PROBLEM"     Long-Range Goal: Patient-Specific Goal   Start Date: 03/09/2020  Expected End Date: 03/22/2021  This Visit's Progress: On track  Priority: High  Note:     Hypertension (BP goal <140/90) -Not ideally controlled  -bilateral renal stenosis <60% noted on 2017 [place] -scr increased from 0.80 (01/04/2020) to 1.53 (02/15/2020)  -baseline scr avg 0.92 12/2019 11/2018 02/2018 -lisinopril 10 mg and  HCTZ 25 mg changed to lisinopril-HCTZ 20-25 mg 01/04/2020 -also on Farxiga 10 mg daily, frequency of NSAID use unclear   -Current treatment:  Lisinopril-HCTZ 20-25 mg (due to confusion patient had help this following last OV)  Amlodipine 10 mg once daily (started 02/15/2020)  Carvedilol 25 mg  twice daily  -Current home readings: has ordered BP cuff from [facility], expecting to get in the mail this week and will start testing as soon as she does -Denies hypotensive/hypertensive symptoms -Educated on BP goals and benefits of medications for prevention of heart attack, stroke and kidney damage; Daily salt intake goal < 2300 mg; Exercise goal of 150 minutes per week; Importance of home blood pressure monitoring; Proper BP monitoring technique; Symptoms of hypotension and importance of maintaining adequate hydration; -Counseled to monitor BP at home twice daily, document, and provide log at future appointments -Counseled on diet and exercise extensively will discuss continued hold of HCTZ/ACEi with PCP and as well as lab/bp check and restarting hctz as appropriate   Diabetes (A1c goal <7%) -Controlled -Current medications:  Farxiga 10 mg once daily  Metformin 500 mg once daily  -Current home glucose readings  fasting glucose: 100-120s -Denies hypoglycemic/hyperglycemic symptoms -Current meal patterns:  Will discuss at f/u -Current exercise: Will discuss at f/u -Educated onA1c and blood sugar goals; Complications of diabetes including kidney damage, retinal damage, and cardiovascular disease; -Counseled to check feet daily and get yearly eye exams -Counseled on diet and exercise extensively Recommended to continue current medication   Patient Goals/Self-Care Activities  Over the next 365 days, patient will:  - take medications as prescribed target a minimum of 150 minutes of moderate intensity exercise weekly       The patient verbalized understanding of instructions provided today and agreed to receive a MyChart copy of patient instruction and/or educational materials. Telephone follow up appointment with pharmacy team member scheduled for: See next appointment with "Care Management Staff" under "What's Next" below.   Consent to CCM Services: Stacy Campbell was given  information about Chronic Care Management services today including:  1. CCM service includes personalized support from designated clinical staff supervised by her physician, including individualized plan of care and coordination with other care providers 2. 24/7 contact phone numbers for assistance for urgent and routine care needs. 3. Service will only be billed when office clinical staff spend 20 minutes or more in a month to coordinate care. 4. Only one practitioner may furnish and bill the service in a calendar month. 5. The patient may stop CCM services at any time (effective at the end of the month) by phone call to the office staff. 6. The patient will be responsible for cost sharing (co-pay) of up to 20% of the service fee (after annual deductible is met).  Patient agreed to services and verbal consent obtained.

## 2020-04-06 ENCOUNTER — Ambulatory Visit: Payer: Medicare HMO

## 2020-04-19 ENCOUNTER — Ambulatory Visit (INDEPENDENT_AMBULATORY_CARE_PROVIDER_SITE_OTHER): Payer: Medicare HMO | Admitting: *Deleted

## 2020-04-19 DIAGNOSIS — E119 Type 2 diabetes mellitus without complications: Secondary | ICD-10-CM | POA: Diagnosis not present

## 2020-04-19 DIAGNOSIS — I10 Essential (primary) hypertension: Secondary | ICD-10-CM

## 2020-04-19 NOTE — Chronic Care Management (AMB) (Signed)
Chronic Care Management   CCM RN Visit Note  04/19/2020 Name: Stacy Campbell MRN: [id number] DOB: 11/23/1955  Subjective: Stacy Campbell is a 65 y.o. year old female who is a primary care patient of [name], MD. The care management team was consulted for assistance with disease management and care coordination needs.    Engaged with patient by telephone for follow up visit in response to provider referral for case management and/or care coordination services.   Consent to Services:  The patient was given information about Chronic Care Management services, agreed to services, and gave verbal consent prior to initiation of services.  Please see initial visit note for detailed documentation.   Patient agreed to services and verbal consent obtained.   Assessment: Review of patient past medical history, allergies, medications, health status, including review of consultants reports, laboratory and other test data, was performed as part of comprehensive evaluation and provision of chronic care management services.   SDOH (Social Determinants of Health) assessments and interventions performed:    CCM Care Plan  Allergies  Allergen Reactions  . Hydrocodone Itching  . Linalool   . Methylisothiazolinone Other (See Comments)    Other reaction(s): Other (See Comments) Positive patch test Positive patch test   . Propylene Glycol   . Aspirin Itching and Other (See Comments)    Lower dose doesn't make her itch (she takes it every day)  . Eggs Or Egg-Derived Products Nausea Only and Other (See Comments)    No reaction if in another food  . Hydrocodone-Guaifenesin Rash    Outpatient Encounter Medications as of 04/19/2020  Medication Sig Note  . empagliflozin (JARDIANCE) 10 MG TABS tablet Take 1 tablet (10 mg total) by mouth daily.   . Fluticasone Furoate (ARNUITY ELLIPTA) 100 MCG/ACT AEPB Inhale 1 puff into the lungs daily.   . metFORMIN (GLUCOPHAGE-XR) 500 MG 24 hr  tablet TAKE 1 TABLET(500 MG) BY MOUTH DAILY WITH BREAKFAST   . mycophenolate (CELLCEPT) 500 MG tablet Take 1 tablet by mouth 2 (two) times daily. Written by derm   . triamcinolone ointment (KENALOG) 0.1 % Apply 1 application topically 2 (two) times daily.   Marland Kitchen amLODipine (NORVASC) 10 MG tablet Take 1 tablet (10 mg total) by mouth daily.   . ARIPiprazole (ABILIFY) 5 MG tablet Take 1 tablet (5 mg total) by mouth daily.   . Aspirin-Salicylamide-Caffeine (BC HEADACHE PO) Take 1 packet by mouth daily as needed (headaches).   . busPIRone (BUSPAR) 5 MG tablet Take 5 mg by mouth 2 (two) times daily.   . carvedilol (COREG) 25 MG tablet Take 1 tablet (25 mg total) by mouth 2 (two) times daily with a meal.   . doxepin (SINEQUAN) 25 MG capsule Take 25 mg by mouth 3 (three) times daily as needed.   Marland Kitchen EPINEPHrine 0.3 mg/0.3 mL IJ SOAJ injection Use as directed for severe allergic reaction (Patient not taking: Reported on 02/15/2020)   . fluconazole (DIFLUCAN) 200 MG tablet Take 200 mg by mouth once a week. Once a week for 12 weeks.   Marland Kitchen glucose blood test strip Test blood sugar daily and record on blood sugar log   . Lancets (ACCU-CHEK SOFT TOUCH) lancets Check blood sugar daily and record in log   . lisinopril-hydrochlorothiazide (ZESTORETIC) 20-25 MG tablet Take 1 tablet by mouth daily. 03/22/2020: Resumed on 03/14/2020 per patient  . meclizine (ANTIVERT) 25 MG tablet Take 1 tablet (25 mg total) by mouth 3 (three) times daily as needed for  dizziness.   . promethazine (PHENERGAN) 25 MG tablet take 1 tablet by mouth every 8 hours if needed for nausea and vomiting   . rosuvastatin (CRESTOR) 10 MG tablet Take 1 tablet (10 mg total) by mouth daily.   . sertraline (ZOLOFT) 100 MG tablet TAKE 1 TABLET(100 MG) BY MOUTH AT BEDTIME   . tiZANidine (ZANAFLEX) 4 MG tablet Take 4 mg by mouth 2 (two) times daily.   . VENTOLIN HFA 108 (90 Base) MCG/ACT inhaler INHALE 2 PUFFS INTO THE LUNGS EVERY 6 HOURS AS NEEDED FOR WHEEZING OR  SHORTNESS OF BREATH   . zolpidem (AMBIEN) 10 MG tablet Take 10 mg by mouth at bedtime.    No facility-administered encounter medications on file as of 04/19/2020.    Patient Active Problem List   Diagnosis Date Noted  . Stricture of ureter 01/04/2020  . Cirrhosis of liver ([place]) - h/o hep C 01/04/2020  . Degenerative joint disease (DJD) of lumbar spine 01/04/2020  . Monoclonal gammopathy of unknown significance (MGUS) 01/23/2019  . Osteoporosis with current pathological fracture 01/21/2019  . Controlled type 2 diabetes mellitus without complication, without long-term current use of insulin ([place]) 12/12/2018  . Hyperlipidemia associated with type 2 diabetes mellitus ([place]) 12/12/2018  . Seasonal and perennial allergic rhinoconjunctivitis 02/12/2018  . History of hepatitis C 10/11/2017  . History of atrial fibrillation 10/11/2017  . Former smoker 10/11/2017  . Dyshidrotic eczema 09/10/2017  . GERD (gastroesophageal reflux disease) 02/15/2017  . Psoriasis 07/10/2015  . Asthma with COPD ([place]) 05/25/2013  . 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 Obesity, Class II, BMI 35-39.9 03/07/2006  . Major depression, recurrent, chronic ([place]) 03/07/2006  . Essential hypertension 03/07/2006  . Chronic low back pain 03/07/2006  . Insomnia 03/07/2006    Conditions to be addressed/monitored:HTN and DMII  Care Plan : Diabetes Type 2 (Adult)  Updates made by Leona Singleton, RN since 04/19/2020 12:00 AM  Problem: Patient will maintain Hgb A1C of below 7 within the next 90 days.   Priority: Medium  Long-Range Goal: Patient will maintain Hgb A1C of below 7 within the next 90 days   Start Date: 03/08/2020  Expected End Date: 09/06/2020  This Visit's Progress: On track  Recent Progress: On track  Priority: Medium  Objective:  Lab Results  Component Value Date   HGBA1C 6.7 (A) 01/04/2020 .   Lab Results  Component Value Date   CREATININE 1.53 (H) 02/15/2020   CREATININE 0.80 01/04/2020   CREATININE 1.01  (H) 11/24/2018   Current Barriers:  Marland Kitchen Knowledge Deficits related to basic Diabetes pathophysiology and self care/management.  Patient's current Hgb A1C level of 6.7.   Continues to monitor blood sugars twice a day.  Have not been able to check blood sugar yet this morning, due to just leaving medical appointment, but reporting recent fasting ranges of 120-130's.  Denies any hypo or hyperglycemic episodes.  Reports compliance with all medications.  States she did attend her eye exam on 04/12/20 with Dr. Ronnald Ramp at [facility]. . Difficulties managing chronic conditions related to pain and anxiety.  Attends pain management clinic to manage chronic back pain. Reports pain limits activity and prevents her from spending time with her family.  Patient states she continues to see therapist and psychiatrist monthly for her anxiety and depression.   Case Manager Clinical Goal(s):  Marland Kitchen Patient will demonstrate improved adherence to prescribed treatment plan for diabetes self care/management as evidenced by: daily monitoring and recording of CBG,  adherence to ADA/  carb modified diet, adherence to prescribed medication regimen, contacting provider for new or worsened symptoms or questions Interventions:  . Collaboration with [name], MD regarding development and update of comprehensive plan of care as evidenced by provider attestation and co-signature . Inter-disciplinary care team collaboration (see longitudinal plan of care) . Reviewed medications with patient and discussed importance of medication adherence . Discussed plans with patient for ongoing care management follow up and provided patient with direct contact information for care management team . Advised patient, providing education and rationale, to check cbg twice a day and record, calling primary care provider for findings outside established parameters.   . Confirmed patient has diabetic testing supplies . Encouraged to discuss pain limiting  effects with pain management provider . Discussed and encouraged heart healthy carbohydrate modified diet/food options; sending education on diabetic diet/food choices . Encouraged patient to request eye exam be faxed to primary care provider . Encouraged to attend scheduled medical appointments:  PCP on 05/06/20 Patient Goals/Self-Care Activities: . Continue to check blood sugars twice a day . Enter blood sugar readings into daily log . Take the blood sugar log to all doctor visits  . Request [employer] to fax eye exam results to Dr. Jonni Sanger office Follow Up Plan: The care management team will reach out to the patient again over the next 45 business days.    Care Plan : Hypertension (Adult)  Updates made by Leona Singleton, RN since 04/19/2020 12:00 AM  Problem: Hypertension (Hypertension)   Priority: Medium  Long-Range Goal: Hypertension Monitored   Start Date: 03/08/2020  Expected End Date: 09/06/2020  This Visit's Progress: On track  Recent Progress: On track  Priority: Medium  Current Barriers:  Marland Kitchen Knowledge Deficits related to basic understanding of hypertension pathophysiology and self care management.  Patient reporting latest BP reading 147/80, with recent ranges of 120-130/70-80's. Denies any light headiness or dizziness at this time.  Denies shortness of breath or swelling.  Reports not having to use her rescue inhaler in a long while. . Difficulties maintaining food in the home.  Reports having trouble affording food and often times about to run out of food before having money to buy more.  Has spoken with care guide and has received list of possible food banks, she still needs to review Nurse Case Manager Clinical Goal(s):  Marland Kitchen Over the next 90 days, patient will verbalize understanding of plan for hypertension management . Over the next 90 days, patient will demonstrate improved adherence to prescribed treatment plan for hypertension as evidenced by taking all medications as  prescribed, monitoring and recording blood pressure as directed, adhering to low sodium/DASH diet Interventions:  . Collaboration with [name], MD regarding development and update of comprehensive plan of care as evidenced by provider attestation and co-signature . Inter-disciplinary care team collaboration (see longitudinal plan of care) . Evaluation of current treatment plan related to hypertension self management and patient's adherence to plan as established by provider. . Reviewed medications with patient and discussed importance of compliance . Encouraged patient to contact CCM Pharmacist or RNCM for any questions or concerns . Discussed plans with patient for ongoing care management follow up and provided patient with direct contact information for care management team . Advised patient, providing education and rationale, to monitor blood pressure daily and record, calling PCP for findings outside established parameters.  . Reviewed signs and symptoms of hypotension and hypertension; encouraged patient to review previous education sent . Encouraged patient to review  list of food banks provided by [facility] Guides . Encouraged her to use Calendar Booklet to log blood pressure readings for provider review . Encouraged low salt carbohydrate modified diabetic diet, sending education on DASH diet Patient Goals/Self-Care Activities:  Over the next 45 days, patient will: . Call 211 when I need some help . Follow-up on any referrals for help I am given . Make a list of family or friends that I can call  . Review list of agencies to assist with food given by the Care Guide . Check blood pressure daily  . Write blood pressure results in a log  . Monitor yourself for hypotension and hypertension . Follow low salt carbohydrate modified diabetic diet Follow Up Plan: The care management team will reach out to the patient again over the next 45 business days.       Plan:The care  management team will reach out to the patient again over the next 45 business days.  Hubert Azure RN, MSN RN Care Management Coordinator  [place] [phone] [email or web address]

## 2020-04-19 NOTE — Patient Instructions (Addendum)
Visit Information  PATIENT GOALS: Goals Addressed            This Visit's Progress   . (RNCM) Find Help in My Community   On track    Timeframe:  Short-Term Goal Priority:  High Start Date:   03/08/20                          Expected End Date:   07/07/20                    Follow Up Date 05/24/20    . Call 211 when I need some help . Follow-up on any referrals for help I am given . Make a list of family or friends that I can call  . Review list of agencies to assist with food given by the Care Guide   Why is this important?    Knowing how and where to find help for yourself or family in your neighborhood and community is an important skill.   You will want to take some steps to learn how.    Notes:    Marland Kitchen (RNCM) Monitor and Manage My Blood Sugar-Diabetes Type 2   On track    Timeframe:  Long-Range Goal Priority:  Medium Start Date:   03/08/20                          Expected End Date:   09/06/20                    Follow Up Date 05/24/20    . Continue to check blood sugars twice a day . Enter blood sugar readings into daily log . Take the blood sugar log to all doctor visits  . Request [employer] to fax eye exam results to Dr. Jonni Sanger office   Why is this important?    Checking your blood sugar at home helps to keep it from getting very high or very low.   Writing the results in a diary or log helps the doctor know how to care for you.   Your blood sugar log should have the time, date and the results.   Also, write down the amount of insulin or other medicine that you take.   Other information, like what you ate, exercise done and how you were feeling, will also be helpful.     Notes:    Marland Kitchen [facility]) Track and Manage My Blood Pressure-Hypertension   On track    Timeframe:  Long-Range Goal Priority:  High Start Date:  03/08/20                           Expected End Date:   09/06/20                    Follow Up Date 05/24/20/22    . Check blood pressure daily  . Write  blood pressure results in a log  . Monitor yourself for hypotension and hypertension . Follow low salt carbohydrate modified diabetic diet   Why is this important?    You won't feel high blood pressure, but it can still hurt your blood vessels.   High blood pressure can cause heart or kidney problems. It can also cause a stroke.   Making lifestyle changes like losing a little weight or eating less salt will help.  Checking your blood pressure at home and at different times of the day can help to control blood pressure.   If the doctor prescribes medicine remember to take it the way the doctor ordered.   Call the office if you cannot afford the medicine or if there are questions about it.     Notes:     Patient verbalizes understanding of instructions provided today and agrees to view in [place].   The care management team will reach out to the patient again over the next 45 business days.   Hubert Azure RN, MSN RN Care Management Coordinator  [facility] [phone] [email or web address]    Diabetes Mellitus and Nutrition, Adult When you have diabetes, or diabetes mellitus, it is very important to have healthy eating habits because your blood sugar (glucose) levels are greatly affected by what you eat and drink. Eating healthy foods in the right amounts, at about the same times every day, can help you:  Control your blood glucose.  Lower your risk of heart disease.  Improve your blood pressure.  Reach or maintain a healthy weight. What can affect my meal plan? Every person with diabetes is different, and each person has different needs for a meal plan. Your health care provider may recommend that you work with a dietitian to make a meal plan that is best for you. Your meal plan may vary depending on factors such as:  The calories you need.  The medicines you take.  Your weight.  Your blood glucose, blood pressure, and cholesterol  levels.  Your activity level.  Other health conditions you have, such as heart or kidney disease. How do carbohydrates affect me? Carbohydrates, also called carbs, affect your blood glucose level more than any other type of food. Eating carbs naturally raises the amount of glucose in your blood. Carb counting is a method for keeping track of how many carbs you eat. Counting carbs is important to keep your blood glucose at a healthy level, especially if you use insulin or take certain oral diabetes medicines. It is important to know how many carbs you can safely have in each meal. This is different for every person. Your dietitian can help you calculate how many carbs you should have at each meal and for each snack. How does alcohol affect me? Alcohol can cause a sudden decrease in blood glucose (hypoglycemia), especially if you use insulin or take certain oral diabetes medicines. Hypoglycemia can be a life-threatening condition. Symptoms of hypoglycemia, such as sleepiness, dizziness, and confusion, are similar to symptoms of having too much alcohol.  Do not drink alcohol if: ? Your health care provider tells you not to drink. ? You are pregnant, may be pregnant, or are planning to become pregnant.  If you drink alcohol: ? Do not drink on an empty stomach. ? Limit how much you use to:  0-1 drink a day for women.  0-2 drinks a day for men. ? Be aware of how much alcohol is in your drink. In the [place] one drink equals one 12 oz bottle of beer (355 mL), one 5 oz glass of wine (148 mL), or one 1 oz glass of hard liquor (44 mL). ? Keep yourself hydrated with water, diet soda, or unsweetened iced tea.  Keep in mind that regular soda, juice, and other mixers may contain a lot of sugar and must be counted as carbs. What are tips for following this plan? Reading food labels  Start by checking the  serving size on the "Nutrition Facts" label of packaged foods and drinks. The amount of calories,  carbs, fats, and other nutrients listed on the label is based on one serving of the item. Many items contain more than one serving per package.  Check the total grams (g) of carbs in one serving. You can calculate the number of servings of carbs in one serving by dividing the total carbs by 15. For example, if a food has 30 g of total carbs per serving, it would be equal to 2 servings of carbs.  Check the number of grams (g) of saturated fats and trans fats in one serving. Choose foods that have a low amount or none of these fats.  Check the number of milligrams (mg) of salt (sodium) in one serving. Most people should limit total sodium intake to less than 2,300 mg per day.  Always check the nutrition information of foods labeled as "low-fat" or "nonfat." These foods may be higher in added sugar or refined carbs and should be avoided.  Talk to your dietitian to identify your daily goals for nutrients listed on the label. Shopping  Avoid buying canned, pre-made, or processed foods. These foods tend to be high in fat, sodium, and added sugar.  Shop around the outside edge of the grocery store. This is where you will most often find fresh fruits and vegetables, bulk grains, fresh meats, and fresh dairy. Cooking  Use low-heat cooking methods, such as baking, instead of high-heat cooking methods like deep frying.  Cook using healthy oils, such as olive, canola, or sunflower oil.  Avoid cooking with butter, cream, or high-fat meats. Meal planning  Eat meals and snacks regularly, preferably at the same times every day. Avoid going long periods of time without eating.  Eat foods that are high in fiber, such as fresh fruits, vegetables, beans, and whole grains. Talk with your dietitian about how many servings of carbs you can eat at each meal.  Eat 4-6 oz (112-168 g) of lean protein each day, such as lean meat, chicken, fish, eggs, or tofu. One ounce (oz) of lean protein is equal to: ? 1 oz (28  g) of meat, chicken, or fish. ? 1 egg. ?  cup (62 g) of tofu.  Eat some foods each day that contain healthy fats, such as avocado, nuts, seeds, and fish.   What foods should I eat? Fruits Berries. Apples. Oranges. Peaches. Apricots. Plums. Grapes. Mango. Papaya. Pomegranate. Kiwi. Cherries. Vegetables Lettuce. Spinach. Leafy greens, including kale, chard, collard greens, and mustard greens. Beets. Cauliflower. Cabbage. Broccoli. Carrots. Green beans. Tomatoes. Peppers. Onions. Cucumbers. Brussels sprouts. Grains Whole grains, such as whole-wheat or whole-grain bread, crackers, tortillas, cereal, and pasta. Unsweetened oatmeal. Quinoa. Brown or wild rice. Meats and other proteins Seafood. Poultry without skin. Lean cuts of poultry and beef. Tofu. Nuts. Seeds. Dairy Low-fat or fat-free dairy products such as milk, yogurt, and cheese. The items listed above may not be a complete list of foods and beverages you can eat. Contact a dietitian for more information. What foods should I avoid? Fruits Fruits canned with syrup. Vegetables Canned vegetables. Frozen vegetables with butter or cream sauce. Grains Refined white flour and flour products such as bread, pasta, snack foods, and cereals. Avoid all processed foods. Meats and other proteins Fatty cuts of meat. Poultry with skin. Breaded or fried meats. Processed meat. Avoid saturated fats. Dairy Full-fat yogurt, cheese, or milk. Beverages Sweetened drinks, such as soda or iced tea. The items  listed above may not be a complete list of foods and beverages you should avoid. Contact a dietitian for more information. Questions to ask a health care provider  Do I need to meet with a diabetes educator?  Do I need to meet with a dietitian?  What number can I call if I have questions?  When are the best times to check my blood glucose? Where to find more information:  American Diabetes Association: diabetes.org  Academy of Nutrition and  Dietetics: www.eatright.[employer] of Diabetes and Digestive and Kidney Diseases: [employer].dk  Association of Diabetes Care and Education Specialists: www.diabeteseducator.org Summary  It is important to have healthy eating habits because your blood sugar (glucose) levels are greatly affected by what you eat and drink.  A healthy meal plan will help you control your blood glucose and maintain a healthy lifestyle.  Your health care provider may recommend that you work with a dietitian to make a meal plan that is best for you.  Keep in mind that carbohydrates (carbs) and alcohol have immediate effects on your blood glucose levels. It is important to count carbs and to use alcohol carefully. This information is not intended to replace advice given to you by your health care provider. Make sure you discuss any questions you have with your health care provider. Document Revised: 12/02/2018 Document Reviewed: 12/02/2018 Elsevier Patient Education  2021 [place].  [email or web address]">  DASH Eating Plan DASH stands for Dietary Approaches to Stop Hypertension. The DASH eating plan is a healthy eating plan that has been shown to:  Reduce high blood pressure (hypertension).  Reduce your risk for type 2 diabetes, heart disease, and stroke.  Help with weight loss. What are tips for following this plan? Reading food labels  Check food labels for the amount of salt (sodium) per serving. Choose foods with less than 5 percent of the Daily Value of sodium. Generally, foods with less than 300 milligrams (mg) of sodium per serving fit into this eating plan.  To find whole grains, look for the word "whole" as the first word in the ingredient list. Shopping  Buy products labeled as "low-sodium" or "no salt added."  Buy fresh foods. Avoid canned foods and pre-made or frozen meals. Cooking  Avoid adding salt when cooking. Use  salt-free seasonings or herbs instead of table salt or sea salt. Check with your health care provider or pharmacist before using salt substitutes.  Do not fry foods. Cook foods using healthy methods such as baking, boiling, grilling, roasting, and broiling instead.  Cook with heart-healthy oils, such as olive, canola, avocado, soybean, or sunflower oil. Meal planning  Eat a balanced diet that includes: ? 4 or more servings of fruits and 4 or more servings of vegetables each day. Try to fill one-half of your plate with fruits and vegetables. ? 6-8 servings of whole grains each day. ? Less than 6 oz (170 g) of lean meat, poultry, or fish each day. A 3-oz (85-g) serving of meat is about the same size as a deck of cards. One egg equals 1 oz (28 g). ? 2-3 servings of low-fat dairy each day. One serving is 1 cup (237 mL). ? 1 serving of nuts, seeds, or beans 5 times each week. ? 2-3 servings of heart-healthy fats. Healthy fats called omega-3 fatty acids are found in foods such as walnuts, flaxseeds, fortified milks, and eggs. These fats are also found in cold-water fish, such as sardines, salmon, and mackerel.  Limit how  much you eat of: ? Canned or prepackaged foods. ? Food that is high in trans fat, such as some fried foods. ? Food that is high in saturated fat, such as fatty meat. ? Desserts and other sweets, sugary drinks, and other foods with added sugar. ? Full-fat dairy products.  Do not salt foods before eating.  Do not eat more than 4 egg yolks a week.  Try to eat at least 2 vegetarian meals a week.  Eat more home-cooked food and less restaurant, buffet, and fast food.   Lifestyle  When eating at a restaurant, ask that your food be prepared with less salt or no salt, if possible.  If you drink alcohol: ? Limit how much you use to:  0-1 drink a day for women who are not pregnant.  0-2 drinks a day for men. ? Be aware of how much alcohol is in your drink. In the [place] one  drink equals one 12 oz bottle of beer (355 mL), one 5 oz glass of wine (148 mL), or one 1 oz glass of hard liquor (44 mL). General information  Avoid eating more than 2,300 mg of salt a day. If you have hypertension, you may need to reduce your sodium intake to 1,500 mg a day.  Work with your health care provider to maintain a healthy body weight or to lose weight. Ask what an ideal weight is for you.  Get at least 30 minutes of exercise that causes your heart to beat faster (aerobic exercise) most days of the week. Activities may include walking, swimming, or biking.  Work with your health care provider or dietitian to adjust your eating plan to your individual calorie needs. What foods should I eat? Fruits All fresh, dried, or frozen fruit. Canned fruit in natural juice (without added sugar). Vegetables Fresh or frozen vegetables (raw, steamed, roasted, or grilled). Low-sodium or reduced-sodium tomato and vegetable juice. Low-sodium or reduced-sodium tomato sauce and tomato paste. Low-sodium or reduced-sodium canned vegetables. Grains Whole-grain or whole-wheat bread. Whole-grain or whole-wheat pasta. Brown rice. [place]. Bulgur. Whole-grain and low-sodium cereals. Pita bread. Low-fat, low-sodium crackers. Whole-wheat flour tortillas. Meats and other proteins Skinless chicken or [place]. Ground chicken or [place]. Pork with fat trimmed off. Fish and seafood. Egg whites. Dried beans, peas, or lentils. Unsalted nuts, nut butters, and seeds. Unsalted canned beans. Lean cuts of beef with fat trimmed off. Low-sodium, lean precooked or cured meat, such as sausages or meat loaves. Dairy Low-fat (1%) or fat-free (skim) milk. Reduced-fat, low-fat, or fat-free cheeses. Nonfat, low-sodium ricotta or cottage cheese. Low-fat or nonfat yogurt. Low-fat, low-sodium cheese. Fats and oils Soft margarine without trans fats. Vegetable oil. Reduced-fat, low-fat, or light mayonnaise and salad dressings  (reduced-sodium). Canola, safflower, olive, avocado, soybean, and sunflower oils. Avocado. Seasonings and condiments Herbs. Spices. Seasoning mixes without salt. Other foods Unsalted popcorn and pretzels. Fat-free sweets. The items listed above may not be a complete list of foods and beverages you can eat. Contact a dietitian for more information. What foods should I avoid? Fruits Canned fruit in a light or heavy syrup. Fried fruit. Fruit in cream or butter sauce. Vegetables Creamed or fried vegetables. Vegetables in a cheese sauce. Regular canned vegetables (not low-sodium or reduced-sodium). Regular canned tomato sauce and paste (not low-sodium or reduced-sodium). Regular tomato and vegetable juice (not low-sodium or reduced-sodium). Angie Fava. Olives. Grains Baked goods made with fat, such as croissants, muffins, or some breads. Dry pasta or rice meal packs. Meats and other  proteins Fatty cuts of meat. Ribs. Fried meat. Berniece Salines. Bologna, salami, and other precooked or cured meats, such as sausages or meat loaves. Fat from the back of a pig (fatback). Bratwurst. Salted nuts and seeds. Canned beans with added salt. Canned or smoked fish. Whole eggs or egg yolks. Chicken or [place] with skin. Dairy Whole or 2% milk, cream, and half-and-half. Whole or full-fat cream cheese. Whole-fat or sweetened yogurt. Full-fat cheese. Nondairy creamers. Whipped toppings. Processed cheese and cheese spreads. Fats and oils Butter. Stick margarine. Lard. Shortening. Ghee. Bacon fat. Tropical oils, such as coconut, palm kernel, or palm oil. Seasonings and condiments Onion salt, garlic salt, seasoned salt, table salt, and sea salt. Worcestershire sauce. Tartar sauce. Barbecue sauce. Teriyaki sauce. Soy sauce, including reduced-sodium. Steak sauce. Canned and packaged gravies. Fish sauce. Oyster sauce. Cocktail sauce. Store-bought horseradish. Ketchup. Mustard. Meat flavorings and tenderizers. Bouillon cubes. Hot sauces.  Pre-made or packaged marinades. Pre-made or packaged taco seasonings. Relishes. Regular salad dressings. Other foods Salted popcorn and pretzels. The items listed above may not be a complete list of foods and beverages you should avoid. Contact a dietitian for more information. Where to find more information  National Heart, Lung, and Blood Institute: https://wilson-eaton.com/  American Heart Association: www.heart.org  Academy of Nutrition and Dietetics: www.eatright.[place]: www.kidney.org Summary  The DASH eating plan is a healthy eating plan that has been shown to reduce high blood pressure (hypertension). It may also reduce your risk for type 2 diabetes, heart disease, and stroke.  When on the DASH eating plan, aim to eat more fresh fruits and vegetables, whole grains, lean proteins, low-fat dairy, and heart-healthy fats.  With the DASH eating plan, you should limit salt (sodium) intake to 2,300 mg a day. If you have hypertension, you may need to reduce your sodium intake to 1,500 mg a day.  Work with your health care provider or dietitian to adjust your eating plan to your individual calorie needs. This information is not intended to replace advice given to you by your health care provider. Make sure you discuss any questions you have with your health care provider. Document Revised: 11/28/2018 Document Reviewed: 11/28/2018 Elsevier Patient Education  2021 [employer].

## 2020-04-29 ENCOUNTER — Other Ambulatory Visit: Payer: Self-pay

## 2020-04-29 ENCOUNTER — Ambulatory Visit
Admission: RE | Admit: 2020-04-29 | Discharge: 2020-04-29 | Disposition: A | Discharge status: Home / Self Care | Payer: Medicare HMO | Source: Ambulatory Visit | Attending: Family Medicine | Admitting: Family Medicine

## 2020-04-29 DIAGNOSIS — Z1231 Encounter for screening mammogram for malignant neoplasm of breast: Secondary | ICD-10-CM

## 2020-05-06 ENCOUNTER — Encounter: Payer: Self-pay | Admitting: Family Medicine

## 2020-05-06 ENCOUNTER — Other Ambulatory Visit: Payer: Self-pay

## 2020-05-06 ENCOUNTER — Ambulatory Visit (INDEPENDENT_AMBULATORY_CARE_PROVIDER_SITE_OTHER): Payer: Medicare HMO | Admitting: Family Medicine

## 2020-05-06 ENCOUNTER — Other Ambulatory Visit (HOSPITAL_COMMUNITY)
Admission: RE | Admit: 2020-05-06 | Discharge: 2020-05-06 | Disposition: A | Discharge status: Home / Self Care | Payer: Medicare HMO | Source: Ambulatory Visit | Attending: Family Medicine | Admitting: Family Medicine

## 2020-05-06 VITALS — BP 136/78 | HR 71 | Temp 98.0°F | Resp 16 | Ht 65.0 in | Wt 228.8 lb

## 2020-05-06 DIAGNOSIS — K7469 Other cirrhosis of liver: Secondary | ICD-10-CM

## 2020-05-06 DIAGNOSIS — E119 Type 2 diabetes mellitus without complications: Secondary | ICD-10-CM | POA: Diagnosis not present

## 2020-05-06 DIAGNOSIS — N952 Postmenopausal atrophic vaginitis: Secondary | ICD-10-CM

## 2020-05-06 DIAGNOSIS — E1169 Type 2 diabetes mellitus with other specified complication: Secondary | ICD-10-CM | POA: Diagnosis not present

## 2020-05-06 DIAGNOSIS — Z0000 Encounter for general adult medical examination without abnormal findings: Secondary | ICD-10-CM | POA: Diagnosis not present

## 2020-05-06 DIAGNOSIS — Z124 Encounter for screening for malignant neoplasm of cervix: Secondary | ICD-10-CM

## 2020-05-06 DIAGNOSIS — F339 Major depressive disorder, recurrent, unspecified: Secondary | ICD-10-CM

## 2020-05-06 DIAGNOSIS — Z1151 Encounter for screening for human papillomavirus (HPV): Secondary | ICD-10-CM | POA: Diagnosis not present

## 2020-05-06 DIAGNOSIS — I10 Essential (primary) hypertension: Secondary | ICD-10-CM

## 2020-05-06 DIAGNOSIS — E785 Hyperlipidemia, unspecified: Secondary | ICD-10-CM

## 2020-05-06 LAB — COMPREHENSIVE METABOLIC PANEL
ALT: 27 U/L (ref 0–35)
AST: 37 U/L (ref 0–37)
Albumin: 4.3 g/dL (ref 3.5–5.2)
Alkaline Phosphatase: 77 U/L (ref 39–117)
BUN: 30 mg/dL — ABNORMAL HIGH (ref 6–23)
CO2: 26 mEq/L (ref 19–32)
Calcium: 9.4 mg/dL (ref 8.4–10.5)
Chloride: 101 mEq/L (ref 96–112)
Creatinine, Ser: 1.29 mg/dL — ABNORMAL HIGH (ref 0.40–1.20)
GFR: 43.77 mL/min — ABNORMAL LOW (ref >60.00)
Glucose, Bld: 145 mg/dL — ABNORMAL HIGH (ref 70–99)
Potassium: 5 mEq/L (ref 3.5–5.1)
Sodium: 137 mEq/L (ref 135–145)
Total Bilirubin: 0.3 mg/dL (ref 0.2–1.2)
Total Protein: 8 g/dL (ref 6.0–8.3)

## 2020-05-06 LAB — POCT GLYCOSYLATED HEMOGLOBIN (HGB A1C): Hemoglobin A1C: 6.9 % — AB (ref 4.0–5.6)

## 2020-05-06 MED ORDER — NYSTATIN 100000 UNIT/GM EX CREA: 1.0000 "application " | TOPICAL_CREAM | Freq: Two times a day (BID) | CUTANEOUS | 0 refills | Status: DC

## 2020-05-06 NOTE — Progress Notes (Signed)
Subjective  Chief Complaint  Patient presents with  . Annual Exam    Fasting  . Diabetes  . Hypertension    HPI: Stacy Campbell is a 65 y.o. female who presents to [facility] today for a Female Wellness Visit. She also has the concerns and/or needs as listed above in the chief complaint. These will be addressed in addition to the Health Maintenance Visit.   Wellness Visit: annual visit with health maintenance review and exam with Pap   Health maintenance: Last Pap was in 2019, ASCUS with HR HPV negative.  Due today for repeat.  Mammogram was recently normal.  Colon cancer screen is up-to-date.  Eligible for Shingrix but taking CellCept for active psoriasis and dermatitis. Chronic disease f/u and/or acute problem visit: (deemed necessary to be done in addition to the wellness visit):  Type 2 diabetes: She feels that she is doing fairly well.  Diet remains a significant issue for her.  She has persistent hunger and makes poor choices often.  She is obese.  No side effects from her medications.  No symptoms of hyperglycemia.  Hypertension: Recently adjusted up her medications.  She is taking the 3 without problems.  No chest pain or shortness of breath.  No lower extremity edema.  Hyperlipidemia on Crestor 20 mg nightly.  Recently checked and near goal.  Major chronic depression which is active.  She is on multiple medications per psychiatry.  This is a main problem for her currently.  No suicidal ideation.  Well compensated cirrhosis of the liver.  Reviewed GI notes.  Reviewed ultrasound reports.   Assessment  1. Annual physical exam   2. Controlled type 2 diabetes mellitus without complication, without long-term current use of insulin ([place])   3. Essential hypertension   4. Hyperlipidemia associated with type 2 diabetes mellitus ([place])   5. Major depression, recurrent, chronic (HCC)   6. Other cirrhosis of liver (HCC)   7. Cervical cancer  screening      Plan  Female Wellness Visit:  Age appropriate Health Maintenance and Prevention measures were discussed with patient. Included topics are cancer screening recommendations, ways to keep healthy (see AVS) including dietary and exercise recommendations, regular eye and dental care, use of seat belts, and avoidance of moderate alcohol use and tobacco use.  Cervical cancer screening with cotesting done today.  Atrophic vaginitis present.  Asymptomatic.  BMI: discussed patient's BMI and encouraged positive lifestyle modifications to help get to or maintain a target BMI.  HM needs and immunizations were addressed and ordered. See below for orders. See HM and immunization section for updates.  Defer Shingrix today given active skin condition and CellCept.  Routine labs and screening tests ordered including cmp, cbc and lipids where appropriate.  Discussed recommendations regarding Vit D and calcium supplementation (see AVS)  Chronic disease management visit and/or acute problem visit:  Type 2 diabetes: Control is good.  Continue Jardiance and metformin at dosages listed below.    Hypertension: Now well controlled on amlodipine, carvedilol and Zestoretic.  Renal function is stable.  Hyperlipidemia on statin.  Near goal.  We will work on diet and diabetic control.  We will consider increasing statin dose in the future if improving.  Major depression: Counseling done.  Continue with psychiatry and therapist.  Reviewed multiple mood medications.  Compensated cirrhosis, avoid liver toxins.  Monitor fluid intake.  Obesity: Discussed diet recommendations.  Consider GLP-1 agonist.   Follow up: 3 months to  recheck diabetes and blood pressure Orders Placed This Encounter  Procedures  . Comprehensive metabolic panel  . POCT HgB A1C   Outpatient Encounter Medications as of 05/06/2020  Medication Sig Note  . amLODipine (NORVASC) 10 MG tablet Take 1 tablet (10 mg total) by mouth daily.    . ARIPiprazole (ABILIFY) 5 MG tablet Take 1 tablet (5 mg total) by mouth daily.   . Aspirin-Salicylamide-Caffeine (BC HEADACHE PO) Take 1 packet by mouth daily as needed (headaches).   . busPIRone (BUSPAR) 5 MG tablet Take 5 mg by mouth 2 (two) times daily.   . carvedilol (COREG) 25 MG tablet Take 1 tablet (25 mg total) by mouth 2 (two) times daily with a meal.   . doxepin (SINEQUAN) 25 MG capsule Take 25 mg by mouth 3 (three) times daily as needed.   . empagliflozin (JARDIANCE) 10 MG TABS tablet Take 1 tablet (10 mg total) by mouth daily.   . fluconazole (DIFLUCAN) 200 MG tablet Take 200 mg by mouth once a week. Once a week for 12 weeks.   . Fluticasone Furoate (ARNUITY ELLIPTA) 100 MCG/ACT AEPB Inhale 1 puff into the lungs daily.   Marland Kitchen glucose blood test strip Test blood sugar daily and record on blood sugar log   . Lancets (ACCU-CHEK SOFT TOUCH) lancets Check blood sugar daily and record in log   . lisinopril-hydrochlorothiazide (ZESTORETIC) 20-25 MG tablet Take 1 tablet by mouth daily. 03/22/2020: Resumed on 03/14/2020 per patient  . meclizine (ANTIVERT) 25 MG tablet Take 1 tablet (25 mg total) by mouth 3 (three) times daily as needed for dizziness.   . metFORMIN (GLUCOPHAGE-XR) 500 MG 24 hr tablet TAKE 1 TABLET(500 MG) BY MOUTH DAILY WITH BREAKFAST   . mycophenolate (CELLCEPT) 500 MG tablet Take 1 tablet by mouth 2 (two) times daily. Written by derm   . nystatin cream (MYCOSTATIN) Apply 1 application topically 2 (two) times daily for 7 days. Then as needed   . Oxycodone HCl 10 MG TABS Take by mouth daily as needed.   . rosuvastatin (CRESTOR) 10 MG tablet Take 1 tablet (10 mg total) by mouth daily.   . sertraline (ZOLOFT) 100 MG tablet TAKE 1 TABLET(100 MG) BY MOUTH AT BEDTIME   . tiZANidine (ZANAFLEX) 4 MG tablet Take 4 mg by mouth 2 (two) times daily.   Marland Kitchen triamcinolone ointment (KENALOG) 0.1 % Apply 1 application topically 2 (two) times daily.   . VENTOLIN HFA 108 (90 Base) MCG/ACT inhaler  INHALE 2 PUFFS INTO THE LUNGS EVERY 6 HOURS AS NEEDED FOR WHEEZING OR SHORTNESS OF BREATH   . zolpidem (AMBIEN) 10 MG tablet Take 10 mg by mouth at bedtime.   Marland Kitchen EPINEPHrine 0.3 mg/0.3 mL IJ SOAJ injection Use as directed for severe allergic reaction (Patient not taking: No sig reported)   . promethazine (PHENERGAN) 25 MG tablet take 1 tablet by mouth every 8 hours if needed for nausea and vomiting (Patient not taking: Reported on 05/06/2020)    No facility-administered encounter medications on file as of 05/06/2020.        Body mass index is 38.07 kg/m. Wt Readings from Last 3 Encounters:  05/06/20 228 lb 12.8 oz (103.8 kg)  02/15/20 218 lb 12.8 oz (99.2 kg)  01/04/20 216 lb 6.4 oz (98.2 kg)     Patient Active Problem List   Diagnosis Date Noted  . Cirrhosis of liver ([place]) - h/o hep C 01/04/2020    Priority: High  . Monoclonal gammopathy of unknown significance ([facility])  01/23/2019    Priority: High  . Osteoporosis with current pathological fracture 01/21/2019    Priority: High    History of chronic pred for urticaria:  Advised to start alendronate jan 2021, stop jan 2026   . Controlled type 2 diabetes mellitus without complication, without long-term current use of insulin ([place]) 12/12/2018    Priority: High    Diagnosed 2020   . Hyperlipidemia associated with type 2 diabetes mellitus ([place]) 12/12/2018    Priority: High    LDL goal of 70 given diabetes   . Asthma with COPD ([place]) 05/25/2013    Priority: High  . Obesity, Class II, BMI 35-39.9 03/07/2006    Priority: High  . Major depression, recurrent, chronic ([place]) 03/07/2006    Priority: High    Sees psychiatry: Sharon Seller, [place]; [facility] with Zella Ball, PsyD   . Essential hypertension 03/07/2006    Priority: High  . Chronic low back pain 03/07/2006    Priority: High  . Insomnia 03/07/2006    Priority: High    On chronic Ambien (started by previous provider).    . Degenerative joint  disease (DJD) of lumbar spine 01/04/2020    Priority: Medium  . History of hepatitis C 10/11/2017    Priority: Medium    S/P Treatment with sofosbuvir and simeprivir.  Followed by hep clinic.  Undetectable viral load - Cured.   Marland Kitchen History of atrial fibrillation 10/11/2017    Priority: Medium  . Former smoker 10/11/2017    Priority: Medium  . GERD (gastroesophageal reflux disease) 02/15/2017    Priority: Medium  . Psoriasis 07/10/2015    Priority: Medium  . Seasonal and perennial allergic rhinoconjunctivitis 02/12/2018    Priority: Low  . Dyshidrotic eczema 09/10/2017    Priority: Low  . 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 TETANUS/TDAP  01/11/2019  . PAP SMEAR-Modifier  01/15/2020  . COVID-19 Vaccine (4 - Booster for Pfizer series) 04/14/2020  . INFLUENZA VACCINE  08/08/2020  . HEMOGLOBIN A1C  11/05/2020  . FOOT EXAM  01/03/2021  . OPHTHALMOLOGY EXAM  04/12/2021  . MAMMOGRAM  04/29/2021  . COLONOSCOPY (Pts 45-43yrs Insurance coverage will need to be confirmed)  12/14/2029  . Hepatitis C Screening  Completed  . HIV Screening  Completed  . HPV VACCINES  Aged Out   Immunization History  Administered Date(s) Administered  . Hepatitis B, adult 10/12/2014, 11/09/2014, 04/11/2015  . Influenza Split 11/14/2010, 10/09/2011  . Influenza Whole 10/13/2007, 01/10/2009, 11/22/2009  . Influenza,inj,Quad PF,6+ Mos 10/10/2012, 10/08/2013, 10/15/2014, 09/29/2015, 11/06/2016, 09/10/2017, 10/15/2018, 09/30/2019  . PFIZER(Purple Top)SARS-COV-2 Vaccination 03/20/2019, 04/15/2019, 10/15/2019  . Pneumococcal Polysaccharide-23 01/10/2009  . Td 01/09/1999, 01/10/2009   We updated and reviewed the patient's past history in detail and it is documented below. Allergies: Patient is allergic to hydrocodone, linalool, methylisothiazolinone, propylene glycol, aspirin, eggs or egg-derived products, and  hydrocodone-guaifenesin. Past Medical History Patient  has a past medical history of Angio-edema, Anxiety, Arthritis, Asthma, Bone spur, Chronic lower back pain, Cirrhosis of liver ([place]) (12/2018), Depression, Diabetes mellitus without complication ([place]), Eczema, GERD (gastroesophageal reflux disease), Hepatitis C, History of atrial fibrillation without current medication, Hyperplastic polyp of intestine, Hypertension, Insomnia, Osteoporosis, Splenic artery aneurysm ([place]), and Urticaria. Past Surgical History Patient  has a past surgical history that includes Cystoscopy with retrograde pyelogram, ureteroscopy and stent placement (Left, 01/29/2017); Cardioversion (1990s); Cystoscopy w/ ureteral stent placement (Left, 01/29/2017); and  Cholecystectomy (N/A, 01/22/2018). Family History: Patient family history includes Asthma in her father and mother; Cancer in her father; Diabetes in her father and mother; Eczema in her father; Hepatitis B in her daughter. Social History:  Patient  reports that she quit smoking about 12 years ago. Her smoking use included cigarettes. She has a 9.25 pack-year smoking history. She has never used smokeless tobacco. She reports that she does not drink alcohol and does not use drugs.  Review of Systems: Constitutional: negative for fever or malaise Ophthalmic: negative for photophobia, double vision or loss of vision Cardiovascular: negative for chest pain, dyspnea on exertion, or new LE swelling Respiratory: negative for SOB or persistent cough Gastrointestinal: negative for abdominal pain, change in bowel habits or melena Genitourinary: negative for dysuria or gross hematuria, no abnormal uterine bleeding or disharge Musculoskeletal: negative for new gait disturbance or muscular weakness Integumentary: negative for new or persistent rashes, no breast lumps Neurological: negative for TIA or stroke symptoms Psychiatric: negative for SI or delusions Allergic/Immunologic:  negative for hives  Patient Care Team    Relationship Specialty Notifications Start End  [name], MD PCP - General Family Medicine  01/04/20   Arta Silence, MD Consulting Physician Gastroenterology  01/04/20   Linward Headland, NP Nurse Practitioner Psychology  01/04/20   Leona Singleton, RN Case Manager   03/01/20    Comment: [facility] RN Care Manager  [phone]  Madelin Rear, [facility] Pharmacist Pharmacist  03/09/20    Comment: Phone: [phone]    Objective  Vitals: BP 136/78   Pulse 71   Temp 98 F (36.7 C) (Temporal)   Resp 16   Ht 5\' 5"  (1.651 m)   Wt 228 lb 12.8 oz (103.8 kg)   SpO2 96%   BMI 38.07 kg/m  General:  Well developed, well nourished, no acute distress  Psych:  Alert and orientedx3,normal mood and affect HEENT:  Normocephalic, atraumatic, non-icteric sclera,  supple neck without adenopathy, mass or thyromegaly Cardiovascular:  Normal S1, S2, RRR without gallop, rub or murmur Respiratory:  Good breath sounds bilaterally, CTAB with normal respiratory effort Gastrointestinal: normal bowel sounds, soft, non-tender, no noted masses. No HSM MSK: no deformities, contusions. Joints are without erythema or swelling.  Skin:  Warm, diffuse eczematous rash with patches  neurologic:    Mental status is normal. CN 2-11 are normal. Gross motor and sensory exams are normal. Normal gait. No tremor Breast Exam: No mass, skin retraction or nipple discharge is appreciated in either breast. No axillary adenopathy. Fibrocystic changes are not noted Pelvic Exam: Normal external genitalia, no vulvar or vaginal lesions present.  Atrophic vaginal mucosa, clear cervix w/o CMT. Bimanual exam is limited by body habitus.  No tenderness.  No inguinal adenopathy. A PAP smear was performed.     Commons side effects, risks, benefits, and alternatives for medications and treatment plan prescribed today were discussed, and the patient expressed understanding of the given  instructions. Patient is instructed to call or message via MyChart if he/she has any questions or concerns regarding our treatment plan. No barriers to understanding were identified. We discussed Red Flag symptoms and signs in detail. Patient expressed understanding regarding what to do in case of urgent or emergency type symptoms.   Medication list was reconciled, printed and provided to the patient in AVS. Patient instructions and summary information was reviewed with the patient as documented in the AVS. This note was prepared with assistance of Dragon voice recognition software. Occasional wrong-word  or sound-a-like substitutions may have occurred due to the inherent limitations of voice recognition software  This visit occurred during the SARS-CoV-2 public health emergency.  Safety protocols were in place, including screening questions prior to the visit, additional usage of staff PPE, and extensive cleaning of exam room while observing appropriate contact time as indicated for disinfecting solutions.

## 2020-05-06 NOTE — Patient Instructions (Signed)
Please return in 3 months for diabetes follow up  I will release your lab results to you on your MyChart account with further instructions. Please reply with any questions.   I sent in a yeast cream to be used as needed but try to keep the area clean and dry to prevent problems.   If you have any questions or concerns, please don't hesitate to send me a message via MyChart or call the office at [phone]. Thank you for visiting with [place] today! It's our pleasure caring for you.

## 2020-05-09 ENCOUNTER — Telehealth: Payer: Medicare HMO

## 2020-05-09 ENCOUNTER — Other Ambulatory Visit: Payer: Self-pay

## 2020-05-09 LAB — CYTOLOGY - PAP
Adequacy: ABSENT
Comment: NEGATIVE
Diagnosis: NEGATIVE
High risk HPV: NEGATIVE

## 2020-05-09 NOTE — Progress Notes (Unsigned)
Chronic Care Management Pharmacy Note  05/09/2020 Name:  Stacy Campbell MRN:  [id number] DOB:  02-Apr-1955  Subjective: Stacy Campbell is an 65 y.o. year old female who is a primary patient of [name], MD.  The CCM team was consulted for assistance with disease management and care coordination needs.   Engaged with patient by telephone for initial visit in response to provider referral for pharmacy case management and/or care coordination services.   Consent to Services:  The patient was given information about Chronic Care Management services, agreed to services, and gave verbal consent prior to initiation of services.  Please see initial visit note for detailed documentation.  Patient Care Team: [name], MD as PCP - General (Family Medicine) Arta Silence, MD as Consulting Physician (Gastroenterology) Linward Headland, NP as Nurse Practitioner (Psychology) Leona Singleton, RN as Case Manager Madelin Rear, [facility] as Pharmacist (Pharmacist)    Objective: Lab Results  Component Value Date   CREATININE 1.29 (H) 05/06/2020   BUN 30 (H) 05/06/2020   GFR 43.77 (L) 05/06/2020   GFRNONAA 59 (L) 11/24/2018   GFRAA 68 11/24/2018   NA 137 05/06/2020   K 5.0 05/06/2020   CALCIUM 9.4 05/06/2020   CO2 26 05/06/2020   Lab Results  Component Value Date/Time   HGBA1C 6.9 (A) 05/06/2020 10:02 AM   HGBA1C 6.7 (A) 01/04/2020 10:09 AM   HGBA1C 6.8 08/14/2019 08:38 AM   HGBA1C 9.8 (A) 11/24/2018 02:50 PM   GFR 43.77 (L) 05/06/2020 10:27 AM   GFR 35.72 (L) 02/15/2020 10:41 AM   MICROALBUR <0.7 01/04/2020 10:46 AM    Last diabetic Eye exam: No results found for: HMDIABEYEEXA  Last diabetic Foot exam: No results found for: HMDIABFOOTEX  Lab Results  Component Value Date   CHOL 140 02/15/2020   HDL 37.50 (L) 02/15/2020   LDLCALC 73 02/15/2020   TRIG 148.0 02/15/2020   CHOLHDL 4 02/15/2020   Hepatic Function Latest Ref Rng & Units 05/06/2020 02/15/2020  01/04/2020  Total Protein 6.0 - 8.3 g/dL 8.0 7.5 7.6  Albumin 3.5 - 5.2 g/dL 4.3 3.9 4.0  AST 0 - 37 U/L 37 25 30  ALT 0 - 35 U/L '27 20 24  ' Alk Phosphatase 39 - 117 U/L 77 56 52  Total Bilirubin 0.2 - 1.2 mg/dL 0.3 0.3 0.3  Bilirubin, Direct 0.00 - 0.40 mg/dL - - -   Lab Results  Component Value Date/Time   TSH 3.34 01/04/2020 10:46 AM   TSH 1.960 01/15/2018 10:49 AM   TSH 2.73 02/08/2016 03:23 PM   FREET4 0.98 07/30/2007 08:40 PM   CBC Latest Ref Rng & Units 01/04/2020 11/24/2018 01/16/2018  WBC 4.0 - 10.5 K/uL 5.5 7.4 7.8  Hemoglobin 12.0 - 15.0 g/dL 13.0 13.6 14.8  Hematocrit 36.0 - 46.0 % 41.6 43.5 48.5(H)  Platelets 150.0 - 400.0 K/uL 197.0 229 281   Lab Results  Component Value Date/Time   VD25OH 20 (L) 05/10/2010 04:34 PM   VD25OH <10 ng/mL (L) 04/06/2009 08:40 PM    Clinical ASCVD:  The 10-year ASCVD risk score Mikey Bussing DC Jr., et al., 2013) is: 18.4%   Values used to calculate the score:     Age: 55 years     Sex: Female     Is Non-Hispanic African American: Yes     Diabetic: Yes     Tobacco smoker: No     Systolic Blood Pressure: 841 mmHg     Is BP treated:  Yes     HDL Cholesterol: 37.5 mg/dL     Total Cholesterol: 140 mg/dL    Depression screen [facility] 2/9 03/08/2020 01/04/2020 08/28/2019  Decreased Interest '3 3 1  ' Down, Depressed, Hopeless '1 3 1  ' PHQ - 2 Score '4 6 2  ' Altered sleeping '1 1 3  ' Tired, decreased energy '2 3 1  ' Change in appetite 0 2 1  Feeling bad or failure about yourself  1 1 0  Trouble concentrating 0 0 0  Moving slowly or fidgety/restless 0 0 0  Suicidal thoughts 0 0 0  PHQ-9 Score '8 13 7  ' Difficult doing work/chores Somewhat difficult - Extremely dIfficult  Some recent data might be hidden    Social History   Tobacco Use  Smoking Status Former Smoker  . Packs/day: 0.25  . Years: 37.00  . Pack years: 9.25  . Types: Cigarettes  . Quit date: 03/30/2008  . Years since quitting: 12.1  Smokeless Tobacco Never Used   BP Readings from Last 3  Encounters:  05/06/20 136/78  02/15/20 (!) 150/82  01/04/20 (!) 152/80   Pulse Readings from Last 3 Encounters:  05/06/20 71  02/15/20 84  01/04/20 90   Wt Readings from Last 3 Encounters:  05/06/20 228 lb 12.8 oz (103.8 kg)  02/15/20 218 lb 12.8 oz (99.2 kg)  01/04/20 216 lb 6.4 oz (98.2 kg)   Assessment/Interventions: Review of patient past medical history, allergies, medications, health status, including review of consultants reports, laboratory and other test data, was performed as part of comprehensive evaluation and provision of chronic care management services.   SDOH:  (Social Determinants of Health) assessments and interventions performed: Yes  CCM Care Plan Allergies  Allergen Reactions  . Hydrocodone Itching  . Linalool   . Methylisothiazolinone Other (See Comments)    Other reaction(s): Other (See Comments) Positive patch test Positive patch test   . Propylene Glycol   . Aspirin Itching and Other (See Comments)    Lower dose doesn't make her itch (she takes it every day)  . Eggs Or Egg-Derived Products Nausea Only and Other (See Comments)    No reaction if in another food  . Hydrocodone-Guaifenesin Rash   Medications Reviewed Today    Reviewed by [name], MD (Physician) on 05/06/20 at [phone]  Med List Status: <None>  Medication Order Taking? Sig Documenting Provider Last Dose Status Informant  amLODipine (NORVASC) 10 MG tablet [id number] Yes Take 1 tablet (10 mg total) by mouth daily. [name], MD Taking Active   ARIPiprazole (ABILIFY) 5 MG tablet [id number] Yes Take 1 tablet (5 mg total) by mouth daily. Sherene Sires, DO Taking Active   Aspirin-Salicylamide-Caffeine [facility] HEADACHE PO) [id number] Yes Take 1 packet by mouth daily as needed (headaches). [provider] Taking Active Self  busPIRone (BUSPAR) 5 MG tablet [id number] Yes Take 5 mg by mouth 2 (two) times daily. [provider] Taking Active   carvedilol (COREG) 25 MG tablet  [id number] Yes Take 1 tablet (25 mg total) by mouth 2 (two) times daily with a meal. [name], MD Taking Active   doxepin (SINEQUAN) 25 MG capsule [id number] Yes Take 25 mg by mouth 3 (three) times daily as needed. [provider] Taking Active   empagliflozin (JARDIANCE) 10 MG TABS tablet [id number] Yes Take 1 tablet (10 mg total) by mouth daily. [name], MD Taking Active   EPINEPHrine 0.3 mg/0.3 mL IJ SOAJ injection [id number] No Use as directed for severe allergic  reaction  Patient not taking: No sig reported   Garnet Sierras, DO Not Taking Active   fluconazole (DIFLUCAN) 200 MG tablet [id number] Yes Take 200 mg by mouth once a week. Once a week for 12 weeks. [provider] Taking Active   Fluticasone Furoate (ARNUITY ELLIPTA) 100 MCG/ACT AEPB [id number] Yes Inhale 1 puff into the lungs daily. [name], MD Taking Active   glucose blood test strip [id number] Yes Test blood sugar daily and record on blood sugar log Sherene Sires, DO Taking Active   Lancets (ACCU-CHEK SOFT TOUCH) lancets [id number] Yes Check blood sugar daily and record in log Bland, Scott, DO Taking Active   lisinopril-hydrochlorothiazide (ZESTORETIC) 20-25 MG tablet [id number] Yes Take 1 tablet by mouth daily. [name], MD Taking Active            Med Note [facility]   Tue Mar 22, 2020 10:07 AM) Resumed on 03/14/2020 per patient  meclizine (ANTIVERT) 25 MG tablet [id number] Yes Take 1 tablet (25 mg total) by mouth 3 (three) times daily as needed for dizziness. Lattie Haw, MD Taking Active   metFORMIN (GLUCOPHAGE-XR) 500 MG 24 hr tablet [id number] Yes TAKE 1 TABLET(500 MG) BY MOUTH DAILY WITH BREAKFAST Lattie Haw, MD Taking Active   mycophenolate (CELLCEPT) 500 MG tablet [id number] Yes Take 1 tablet by mouth 2 (two) times daily. Written by derm [provider] Taking Active   Oxycodone HCl 10 MG TABS [id number] Yes Take by mouth daily as needed. [provider] Taking  Active   promethazine (PHENERGAN) 25 MG tablet [id number] No take 1 tablet by mouth every 8 hours if needed for nausea and vomiting  Patient not taking: Reported on 05/06/2020   Sherene Sires, DO Not Taking Active Self  rosuvastatin (CRESTOR) 10 MG tablet [id number] Yes Take 1 tablet (10 mg total) by mouth daily. [name], MD Taking Active   sertraline (ZOLOFT) 100 MG tablet [id number] Yes TAKE 1 TABLET(100 MG) BY MOUTH AT BEDTIME Lattie Haw, MD Taking Active   tiZANidine (ZANAFLEX) 4 MG tablet [id number] Yes Take 4 mg by mouth 2 (two) times daily. [provider] Taking Active Self  triamcinolone ointment (KENALOG) 0.1 % [id number] Yes Apply 1 application topically 2 (two) times daily. [provider] Taking Active Self  VENTOLIN HFA 108 (90 Base) MCG/ACT inhaler [id number] Yes INHALE 2 PUFFS INTO THE LUNGS EVERY 6 HOURS AS NEEDED FOR WHEEZING OR SHORTNESS OF Lowella Curb, Scott, DO Taking Active   zolpidem (AMBIEN) 10 MG tablet [id number] Yes Take 10 mg by mouth at bedtime. [provider] Taking Active          Patient Active Problem List   Diagnosis Date Noted  . Stricture of ureter 01/04/2020  . Cirrhosis of liver ([place]) - h/o hep C 01/04/2020  . Degenerative joint disease (DJD) of lumbar spine 01/04/2020  . Monoclonal gammopathy of unknown significance (MGUS) 01/23/2019  . Osteoporosis with current pathological fracture 01/21/2019  . Controlled type 2 diabetes mellitus without complication, without long-term current use of insulin ([place]) 12/12/2018  . Hyperlipidemia associated with type 2 diabetes mellitus ([place]) 12/12/2018  . Seasonal and perennial allergic rhinoconjunctivitis 02/12/2018  . History of hepatitis C 10/11/2017  . History of atrial fibrillation 10/11/2017  . Former smoker 10/11/2017  . Dyshidrotic eczema 09/10/2017  . GERD (gastroesophageal reflux disease) 02/15/2017  . Psoriasis 07/10/2015  . Asthma with COPD ([place]) 05/25/2013  . Prurigo  nodularis 02/17/2011  . Obesity, Class II, BMI  35-39.9 03/07/2006  . Major depression, recurrent, chronic ([place]) 03/07/2006  . Essential hypertension 03/07/2006  . Chronic low back pain 03/07/2006  . Insomnia 03/07/2006   Immunization History  Administered Date(s) Administered  . Hepatitis B, adult 10/12/2014, 11/09/2014, 04/11/2015  . Influenza Split 11/14/2010, 10/09/2011  . Influenza Whole 10/13/2007, 01/10/2009, 11/22/2009  . Influenza,inj,Quad PF,6+ Mos 10/10/2012, 10/08/2013, 10/15/2014, 09/29/2015, 11/06/2016, 09/10/2017, 10/15/2018, 09/30/2019  . PFIZER(Purple Top)SARS-COV-2 Vaccination 03/20/2019, 04/15/2019, 10/15/2019  . Pneumococcal Polysaccharide-23 01/10/2009  . Td 01/09/1999, 01/10/2009    Conditions to be addressed/monitored:  Hypertension, Hyperlipidemia, Diabetes, Atrial Fibrillation, GERD, COPD, Chronic Kidney Disease, Hypothyroidism, Anxiety and Osteoporosis There are no care plans that you recently modified to display for this patient.   Medication Assistance: None required at this time, will discuss further at f/u Patient's preferred pharmacy is:  [facility] [place], [place] - [address] [place] [address] Phone: [phone] Fax: [phone]  Patient agrees to Care Plan and Follow-up.  Current Barriers:  . Suboptimal therapeutic regimen for HTN  Pharmacist Clinical Goal(s):  Marland Kitchen Over the next 365 days, patient will verbalize ability to afford treatment regimen . achieve adherence to monitoring guidelines and medication adherence to achieve therapeutic efficacy . contact provider office for questions/concerns as evidenced notation of same in electronic health record through collaboration with PharmD and provider.   Interventions: . 1:1 collaboration with [name], MD regarding development and update of comprehensive plan of care as evidenced by provider  attestation and co-signature . Inter-disciplinary care team collaboration (see longitudinal plan of care) . Comprehensive medication review performed; medication list updated in electronic medical record  Hypertension (BP goal <140/90) -Not ideally controlled  -bilateral renal stenosis <60% noted on 2017 [place] -scr increased from 0.80 (01/04/2020) to 1.53 (02/15/2020)  -baseline scr avg 0.92 12/2019 11/2018 02/2018 -lisinopril 10 mg and HCTZ 25 mg changed to lisinopril-HCTZ 20-25 mg 01/04/2020 -also on Farxiga 10 mg daily, frequency of NSAID use unclear   -Current treatment: . Lisinopril-HCTZ 20-25 mg (due to confusion patient had help this following last OV) . Amlodipine 10 mg once daily (started 02/15/2020) . Carvedilol 25 mg twice daily  -Current home readings: has ordered BP cuff from [facility], expecting to get in the mail this week and will start testing as soon as she does -Denies hypotensive/hypertensive symptoms -Educated on BP goals and benefits of medications for prevention of heart attack, stroke and kidney damage; Daily salt intake goal < 2300 mg; Exercise goal of 150 minutes per week; Importance of home blood pressure monitoring; Proper BP monitoring technique; Symptoms of hypotension and importance of maintaining adequate hydration; -Counseled to monitor BP at home twice daily, document, and provide log at future appointments -Counseled on diet and exercise extensively will discuss continued hold of HCTZ/ACEi with PCP and as well as lab/bp check and restarting hctz as appropriate  Diabetes (A1c goal <7%) -Controlled -Current medications: . Farxiga 10 mg once daily . Metformin 500 mg once daily  -Current home glucose readings . fasting glucose: 100-120s -Denies hypoglycemic/hyperglycemic symptoms -Current meal patterns:  Will discuss at f/u -Current exercise: Will discuss at f/u -Educated onA1c and blood sugar goals; Complications of diabetes including kidney damage, retinal  damage, and cardiovascular disease; -Counseled to check feet daily and get yearly eye exams -Counseled on diet and exercise extensively Recommended to continue current medication  Patient Goals/Self-Care Activities . Over the next 365 days, patient will:  -  take medications as prescribed target a minimum of 150 minutes of moderate intensity exercise weekly  Future Appointments  Date Time Provider [place]  05/09/2020  2:00 PM Madelin Rear, [facility]  05/24/2020 10:00 AM [facility] CARE [facility]  08/05/2020 11:00 AM [name], MD [facility]   Follow-up plan with Care Management Team: . CPA: tbd . RPH: f/u visit tbd   Madelin Rear, Pharm.D., BCGP Clinical Pharmacist [place] [phone]

## 2020-05-18 ENCOUNTER — Telehealth: Payer: Medicare HMO

## 2020-05-24 ENCOUNTER — Ambulatory Visit (INDEPENDENT_AMBULATORY_CARE_PROVIDER_SITE_OTHER): Payer: Medicare HMO | Admitting: *Deleted

## 2020-05-24 DIAGNOSIS — I10 Essential (primary) hypertension: Secondary | ICD-10-CM

## 2020-05-24 DIAGNOSIS — E119 Type 2 diabetes mellitus without complications: Secondary | ICD-10-CM

## 2020-05-24 NOTE — Patient Instructions (Signed)
Visit Information  PATIENT GOALS: Goals Addressed            This Visit's Progress   . (RNCM) Find Help in My Community   On track    Timeframe:  Short-Term Goal Priority:  High Start Date:   03/08/20                          Expected End Date:   07/07/20                    Follow Up Date 06/28/20    . Call 211 when I need some help . Follow-up on any referrals for help I am given . Make a list of family or friends that I can call  . Review list of agencies to assist with food given by the Care Guide   Why is this important?    Knowing how and where to find help for yourself or family in your neighborhood and community is an important skill.   You will want to take some steps to learn how.    Notes:     Marland Kitchen (RNCM) Monitor and Manage My Blood Sugar-Diabetes Type 2   On track    Timeframe:  Long-Range Goal Priority:  Medium Start Date:   03/08/20                          Expected End Date:   09/06/20                    Follow Up Date 06/28/20    . Continue to check blood sugars twice a day . Enter blood sugar readings into daily log . Take the blood sugar log to all doctor visits  . Request [employer] to fax eye exam results to Dr. Jonni Sanger office . Increase activity slowly as tolerated   Why is this important?    Checking your blood sugar at home helps to keep it from getting very high or very low.   Writing the results in a diary or log helps the doctor know how to care for you.   Your blood sugar log should have the time, date and the results.   Also, write down the amount of insulin or other medicine that you take.   Other information, like what you ate, exercise done and how you were feeling, will also be helpful.     Notes:     Marland Kitchen [facility]) Track and Manage My Blood Pressure-Hypertension   On track    Timeframe:  Long-Range Goal Priority:  Medium Start Date:  03/08/20                           Expected End Date:   09/06/20                    Follow Up Date 06/28/20     . Check blood pressure daily  . Write blood pressure results in a log  . Monitor yourself for hypotension and hypertension . Follow low salt carbohydrate modified diabetic diet   Why is this important?    You won't feel high blood pressure, but it can still hurt your blood vessels.   High blood pressure can cause heart or kidney problems. It can also cause a stroke.   Making lifestyle changes like losing a little weight  or eating less salt will help.   Checking your blood pressure at home and at different times of the day can help to control blood pressure.   If the doctor prescribes medicine remember to take it the way the doctor ordered.   Call the office if you cannot afford the medicine or if there are questions about it.     Notes:        Patient verbalizes understanding of instructions provided today and agrees to view in [place].   The care management team will reach out to the patient again over the next 45 business days.   Hubert Azure RN, MSN RN Care Management Coordinator  [facility] [phone] [email or web address]

## 2020-05-24 NOTE — Chronic Care Management (AMB) (Signed)
Chronic Care Management   CCM RN Visit Note  05/24/2020 Name: Stacy Campbell MRN: [id number] DOB: 25-Jan-1955  Subjective: Stacy Campbell is a 65 y.o. year old female who is a primary care patient of [name], MD. The care management team was consulted for assistance with disease management and care coordination needs.    Engaged with patient by telephone for follow up visit in response to provider referral for case management and/or care coordination services.   Consent to Services:  The patient was given information about Chronic Care Management services, agreed to services, and gave verbal consent prior to initiation of services.  Please see initial visit note for detailed documentation.   Patient agreed to services and verbal consent obtained.   Assessment: Review of patient past medical history, allergies, medications, health status, including review of consultants reports, laboratory and other test data, was performed as part of comprehensive evaluation and provision of chronic care management services.   SDOH (Social Determinants of Health) assessments and interventions performed:    CCM Care Plan  Allergies  Allergen Reactions  . Hydrocodone Itching  . Linalool   . Methylisothiazolinone Other (See Comments)    Other reaction(s): Other (See Comments) Positive patch test Positive patch test   . Propylene Glycol   . Aspirin Itching and Other (See Comments)    Lower dose doesn't make her itch (she takes it every day)  . Eggs Or Egg-Derived Products Nausea Only and Other (See Comments)    No reaction if in another food  . Hydrocodone-Guaifenesin Rash    Outpatient Encounter Medications as of 05/24/2020  Medication Sig Note  . FLUoxetine (PROZAC) 10 MG tablet Take 10 mg by mouth daily.   Marland Kitchen amLODipine (NORVASC) 10 MG tablet Take 1 tablet (10 mg total) by mouth daily.   . ARIPiprazole (ABILIFY) 5 MG tablet Take 1 tablet (5 mg total) by mouth daily.   .  Aspirin-Salicylamide-Caffeine (BC HEADACHE PO) Take 1 packet by mouth daily as needed (headaches).   . busPIRone (BUSPAR) 5 MG tablet Take 5 mg by mouth 2 (two) times daily.   . carvedilol (COREG) 25 MG tablet Take 1 tablet (25 mg total) by mouth 2 (two) times daily with a meal.   . doxepin (SINEQUAN) 25 MG capsule Take 25 mg by mouth 3 (three) times daily as needed.   . empagliflozin (JARDIANCE) 10 MG TABS tablet Take 1 tablet (10 mg total) by mouth daily.   Marland Kitchen EPINEPHrine 0.3 mg/0.3 mL IJ SOAJ injection Use as directed for severe allergic reaction (Patient not taking: No sig reported)   . fluconazole (DIFLUCAN) 200 MG tablet Take 200 mg by mouth once a week. Once a week for 12 weeks.   . Fluticasone Furoate (ARNUITY ELLIPTA) 100 MCG/ACT AEPB Inhale 1 puff into the lungs daily.   Marland Kitchen glucose blood test strip Test blood sugar daily and record on blood sugar log   . Lancets (ACCU-CHEK SOFT TOUCH) lancets Check blood sugar daily and record in log   . lisinopril-hydrochlorothiazide (ZESTORETIC) 20-25 MG tablet Take 1 tablet by mouth daily. 03/22/2020: Resumed on 03/14/2020 per patient  . meclizine (ANTIVERT) 25 MG tablet Take 1 tablet (25 mg total) by mouth 3 (three) times daily as needed for dizziness.   . metFORMIN (GLUCOPHAGE-XR) 500 MG 24 hr tablet TAKE 1 TABLET(500 MG) BY MOUTH DAILY WITH BREAKFAST   . mycophenolate (CELLCEPT) 500 MG tablet Take 1 tablet by mouth 2 (two) times daily. Written by derm   .  Oxycodone HCl 10 MG TABS Take by mouth daily as needed.   . promethazine (PHENERGAN) 25 MG tablet take 1 tablet by mouth every 8 hours if needed for nausea and vomiting (Patient not taking: Reported on 05/06/2020)   . rosuvastatin (CRESTOR) 10 MG tablet Take 1 tablet (10 mg total) by mouth daily.   . sertraline (ZOLOFT) 100 MG tablet TAKE 1 TABLET(100 MG) BY MOUTH AT BEDTIME (Patient not taking: Reported on 05/24/2020) 05/24/2020: Reports medication changed by psychiatrist  . tiZANidine (ZANAFLEX) 4 MG  tablet Take 4 mg by mouth 2 (two) times daily.   Marland Kitchen triamcinolone ointment (KENALOG) 0.1 % Apply 1 application topically 2 (two) times daily.   . VENTOLIN HFA 108 (90 Base) MCG/ACT inhaler INHALE 2 PUFFS INTO THE LUNGS EVERY 6 HOURS AS NEEDED FOR WHEEZING OR SHORTNESS OF BREATH   . zolpidem (AMBIEN) 10 MG tablet Take 10 mg by mouth at bedtime.    No facility-administered encounter medications on file as of 05/24/2020.    Patient Active Problem List   Diagnosis Date Noted  . Stricture of ureter 01/04/2020  . Cirrhosis of liver ([place]) - h/o hep C 01/04/2020  . Degenerative joint disease (DJD) of lumbar spine 01/04/2020  . Monoclonal gammopathy of unknown significance (MGUS) 01/23/2019  . Osteoporosis with current pathological fracture 01/21/2019  . Controlled type 2 diabetes mellitus without complication, without long-term current use of insulin ([place]) 12/12/2018  . Hyperlipidemia associated with type 2 diabetes mellitus ([place]) 12/12/2018  . Seasonal and perennial allergic rhinoconjunctivitis 02/12/2018  . History of hepatitis C 10/11/2017  . History of atrial fibrillation 10/11/2017  . Former smoker 10/11/2017  . Dyshidrotic eczema 09/10/2017  . GERD (gastroesophageal reflux disease) 02/15/2017  . Psoriasis 07/10/2015  . Asthma with COPD ([place]) 05/25/2013  . 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 Obesity, Class II, BMI 35-39.9 03/07/2006  . Major depression, recurrent, chronic ([place]) 03/07/2006  . Essential hypertension 03/07/2006  . Chronic low back pain 03/07/2006  . Insomnia 03/07/2006    Conditions to be addressed/monitored:HTN and DMII  Care Plan : Diabetes Type 2 (Adult)  Updates made by Leona Singleton, RN since 05/24/2020 12:00 AM  Problem: Patient will maintain Hgb A1C of below 7 within the next 90 days.   Priority: Medium  Long-Range Goal: Patient will maintain Hgb A1C of below 7 within the next 90 days   Start Date: 03/08/2020  Expected End Date: 09/06/2020  This Visit's Progress:  On track  Recent Progress: On track  Priority: Medium  Objective:  Lab Results  Component Value Date   HGBA1C 6.9 (A) 05/06/2020 .   Lab Results  Component Value Date   CREATININE 1.29 (H) 05/06/2020   CREATININE 1.53 (H) 02/15/2020   CREATININE 0.80 01/04/2020   Current Barriers:  Marland Kitchen Knowledge Deficits related to basic Diabetes pathophysiology and self care/management.  Patient's current Hgb A1C level slightly increased to 6.9.   Continues to monitor blood sugars twice a day.  Fasting blood sugar this morning was 130 with recent fasting ranges of 120-140's.  Denies any hypo or hyperglycemic episodes.  Reports compliance with all medications.  States she did attend her eye exam on 04/12/20 with Dr. Ronnald Ramp at [facility].  Concerned about weight gain, discussed increasing activity as back pain allows. . Difficulties managing chronic conditions related to pain and anxiety.  Attends pain management clinic to manage chronic back pain. Reports pain limits activity and prevents her from spending time with her family.  Patient states she  continues to see therapist and psychiatrist monthly for her anxiety and depression.   Case Manager Clinical Goal(s):  Marland Kitchen Patient will demonstrate improved adherence to prescribed treatment plan for diabetes self care/management as evidenced by: daily monitoring and recording of CBG,  adherence to ADA/ carb modified diet, adherence to prescribed medication regimen, contacting provider for new or worsened symptoms or questions Interventions:  . Collaboration with [name], MD regarding development and update of comprehensive plan of care as evidenced by provider attestation and co-signature . Inter-disciplinary care team collaboration (see longitudinal plan of care) . Reviewed medications with patient and discussed importance of medication adherence . Discussed plans with patient for ongoing care management follow up and provided patient with direct contact  information for care management team . Advised patient, providing education and rationale, to check cbg twice a day and record, calling primary care provider for findings outside established parameters.   . Congratulated patient on current Hgb A1C and discussed ways to maintain levels . Discussed weight gain/loss and encouraged to increase activity as back pain allows . Encouraged to discuss pain limiting effects with pain management provider . Discussed and encouraged heart healthy carbohydrate modified diet/food options; sent education on diabetic diet/food choices . Encouraged patient to request eye exam be faxed to primary care provider . Encouraged to attend scheduled medical appointments . Sending [facility] Exercise Program Activity Booklet . No significant recommendations/changes Patient Goals/Self-Care Activities: . Continue to check blood sugars twice a day . Enter blood sugar readings into daily log . Take the blood sugar log to all doctor visits  . Request [employer] to fax eye exam results to Dr. Jonni Sanger office . Increase activity slowly as tolerated Follow Up Plan: The care management team will reach out to the patient again over the next 45 business days.    Care Plan : Hypertension (Adult)  Updates made by Leona Singleton, RN since 05/24/2020 12:00 AM  Problem: Hypertension (Hypertension)   Priority: Medium  Long-Range Goal: Hypertension Monitored   Start Date: 03/08/2020  Expected End Date: 09/06/2020  This Visit's Progress: On track  Recent Progress: On track  Priority: Medium  Current Barriers:  Marland Kitchen Knowledge Deficits related to basic understanding of hypertension pathophysiology and self care management.  Patient reporting latest BP reading 119/73, with recent ranges of 110-120/70-80's. Denies any light headiness or dizziness at this time.  Denies shortness of breath or swelling.   . Difficulties maintaining food in the home.  Reports having trouble affording food and often  times about to run out of food before having money to buy more.  Has spoken with care guide and has received list of possible food banks, she still needs to review Nurse Case Manager Clinical Goal(s):  Marland Kitchen Over the next 90 days, patient will verbalize understanding of plan for hypertension management . Over the next 90 days, patient will demonstrate improved adherence to prescribed treatment plan for hypertension as evidenced by taking all medications as prescribed, monitoring and recording blood pressure as directed, adhering to low sodium/DASH diet Interventions:  . Collaboration with [name], MD regarding development and update of comprehensive plan of care as evidenced by provider attestation and co-signature . Inter-disciplinary care team collaboration (see longitudinal plan of care) . Evaluation of current treatment plan related to hypertension self management and patient's adherence to plan as established by provider. . Reviewed medications with patient and discussed importance of compliance . Encouraged patient to contact CCM Pharmacist or RNCM for any questions  or concerns . Discussed plans with patient for ongoing care management follow up and provided patient with direct contact information for care management team . Advised patient, providing education and rationale, to monitor blood pressure daily and record, calling PCP for findings outside established parameters.  . Reviewed signs and symptoms of hypotension and hypertension; encouraged patient to review previous education sent . Encouraged patient to review list of food banks provided by [facility] Guides . Encouraged her to continue to use Calendar Booklet to log blood pressure readings for provider review . Encouraged low salt carbohydrate modified diabetic diet, sending education on DASH diet . Encouraged to continue with therapy and discussed not stopping depression medication . Discussed increasing activity as  tolerated . No significant recommendations/changes Patient Goals/Self-Care Activities:  Over the next 45 days, patient will: . Call 211 when I need some help . Follow-up on any referrals for help I am given . Make a list of family or friends that I can call  . Review list of agencies to assist with food given by the Care Guide . Check blood pressure daily  . Write blood pressure results in a log  . Monitor yourself for hypotension and hypertension . Follow low salt carbohydrate modified diabetic diet Follow Up Plan: The care management team will reach out to the patient again over the next 45 business days.       Plan:The care management team will reach out to the patient again over the next 45 business days.  Hubert Azure RN, MSN RN Care Management Coordinator  [place] [phone] [email or web address]

## 2020-05-31 ENCOUNTER — Ambulatory Visit: Payer: Medicare HMO

## 2020-05-31 ENCOUNTER — Other Ambulatory Visit: Payer: Self-pay | Admitting: Family Medicine

## 2020-05-31 DIAGNOSIS — J449 Chronic obstructive pulmonary disease, unspecified: Secondary | ICD-10-CM | POA: Diagnosis not present

## 2020-05-31 DIAGNOSIS — I10 Essential (primary) hypertension: Secondary | ICD-10-CM

## 2020-05-31 DIAGNOSIS — E119 Type 2 diabetes mellitus without complications: Secondary | ICD-10-CM

## 2020-05-31 MED ORDER — ALBUTEROL SULFATE HFA 108 (90 BASE) MCG/ACT IN AERS: 2.0000 | INHALATION_SPRAY | Freq: Four times a day (QID) | RESPIRATORY_TRACT | 1 refills | Status: DC | PRN

## 2020-05-31 NOTE — Progress Notes (Signed)
Chronic Care Management Pharmacy Note  05/31/2020 Name:  Stacy Campbell MRN:  [id number] DOB:  1955/04/18  Subjective: Stacy Campbell is an 65 y.o. year old female who is a primary patient of [name], MD.  The CCM team was consulted for assistance with disease management and care coordination needs.   Engaged with patient by telephone for initial visit in response to provider referral for pharmacy case management and/or care coordination services.   Consent to Services:  The patient was given information about Chronic Care Management services, agreed to services, and gave verbal consent prior to initiation of services.  Please see initial visit note for detailed documentation.  Patient Care Team: [name], MD as PCP - General (Family Medicine) Arta Silence, MD as Consulting Physician (Gastroenterology) Linward Headland, NP as Nurse Practitioner (Psychology) Leona Singleton, RN as Case Manager Madelin Rear, [facility] as Pharmacist (Pharmacist)    Objective: Lab Results  Component Value Date   CREATININE 1.29 (H) 05/06/2020   BUN 30 (H) 05/06/2020   GFR 43.77 (L) 05/06/2020   GFRNONAA 59 (L) 11/24/2018   GFRAA 68 11/24/2018   NA 137 05/06/2020   K 5.0 05/06/2020   CALCIUM 9.4 05/06/2020   CO2 26 05/06/2020   Lab Results  Component Value Date/Time   HGBA1C 6.9 (A) 05/06/2020 10:02 AM   HGBA1C 6.7 (A) 01/04/2020 10:09 AM   HGBA1C 6.8 08/14/2019 08:38 AM   HGBA1C 9.8 (A) 11/24/2018 02:50 PM   GFR 43.77 (L) 05/06/2020 10:27 AM   GFR 35.72 (L) 02/15/2020 10:41 AM   MICROALBUR <0.7 01/04/2020 10:46 AM    Last diabetic Eye exam: No results found for: HMDIABEYEEXA  Last diabetic Foot exam: No results found for: HMDIABFOOTEX  Lab Results  Component Value Date   CHOL 140 02/15/2020   HDL 37.50 (L) 02/15/2020   LDLCALC 73 02/15/2020   TRIG 148.0 02/15/2020   CHOLHDL 4 02/15/2020   Hepatic Function Latest Ref Rng & Units 05/06/2020 02/15/2020  01/04/2020  Total Protein 6.0 - 8.3 g/dL 8.0 7.5 7.6  Albumin 3.5 - 5.2 g/dL 4.3 3.9 4.0  AST 0 - 37 U/L 37 25 30  ALT 0 - 35 U/L _0 Alk Phosphatase 39 - 117 U/L 77 56 52  Total Bilirubin 0.2 - 1.2 mg/dL 0.3 0.3 0.3  Bilirubin, Direct 0.00 - 0.40 mg/dL - - -   Lab Results  Component Value Date/Time   TSH 3.34 01/04/2020 10:46 AM   TSH 1.960 01/15/2018 10:49 AM   TSH 2.73 02/08/2016 03:23 PM   FREET4 0.98 07/30/2007 08:40 PM   CBC Latest Ref Rng & Units 01/04/2020 11/24/2018 01/16/2018  WBC 4.0 - 10.5 K/uL 5.5 7.4 7.8  Hemoglobin 12.0 - 15.0 g/dL 13.0 13.6 14.8  Hematocrit 36.0 - 46.0 % 41.6 43.5 48.5(H)  Platelets 150.0 - 400.0 K/uL 197.0 229 281   Lab Results  Component Value Date/Time   VD25OH 20 (L) 05/10/2010 04:34 PM   VD25OH <10 ng/mL (L) 04/06/2009 08:40 PM    Clinical ASCVD:  The 10-year ASCVD risk score Mikey Bussing DC Jr., et al., 2013) is: 18.4%   Values used to calculate the score:     Age: 38 years     Sex: Female     Is Non-Hispanic African American: Yes     Diabetic: Yes     Tobacco smoker: No     Systolic Blood Pressure: 673 mmHg     Is BP treated:  Yes     HDL Cholesterol: 37.5 mg/dL     Total Cholesterol: 140 mg/dL    Depression screen [facility] 2/9 03/08/2020 01/04/2020 08/28/2019  Decreased Interest _0 Down, Depressed, Hopeless _1 PHQ - 2 Score _2 Altered sleeping _3 Tired, decreased energy _4 Change in appetite 0 2 1  Feeling bad or failure about yourself  1 1 0  Trouble concentrating 0 0 0  Moving slowly or fidgety/restless 0 0 0  Suicidal thoughts 0 0 0  PHQ-9 Score _5 Difficult doing work/chores Somewhat difficult - Extremely dIfficult  Some recent data might be hidden    Social History   Tobacco Use  Smoking Status Former Smoker  . Packs/day: 0.25  . Years: 37.00  . Pack years: 9.25  . Types: Cigarettes  . Quit date: 03/30/2008  . Years since quitting: 12.1  Smokeless Tobacco Never Used   BP Readings from Last 3  Encounters:  05/06/20 136/78  02/15/20 (!) 150/82  01/04/20 (!) 152/80   Pulse Readings from Last 3 Encounters:  05/06/20 71  02/15/20 84  01/04/20 90   Wt Readings from Last 3 Encounters:  05/06/20 228 lb 12.8 oz (103.8 kg)  02/15/20 218 lb 12.8 oz (99.2 kg)  01/04/20 216 lb 6.4 oz (98.2 kg)   Assessment/Interventions: Review of patient past medical history, allergies, medications, health status, including review of consultants reports, laboratory and other test data, was performed as part of comprehensive evaluation and provision of chronic care management services.   SDOH:  (Social Determinants of Health) assessments and interventions performed: Yes  CCM Care Plan Allergies  Allergen Reactions  . Hydrocodone Itching  . Linalool   . Methylisothiazolinone Other (See Comments)    Other reaction(s): Other (See Comments) Positive patch test Positive patch test   . Propylene Glycol   . Aspirin Itching and Other (See Comments)    Lower dose doesn't make her itch (she takes it every day)  . Eggs Or Egg-Derived Products Nausea Only and Other (See Comments)    No reaction if in another food  . Hydrocodone-Guaifenesin Rash   Medications Reviewed Today    Reviewed by Leona Singleton, RN (Registered Nurse) on 05/24/20 at [place] List Status: <None>  Medication Order Taking? Sig Documenting Provider Last Dose Status Informant  amLODipine (NORVASC) 10 MG tablet [id number]  Take 1 tablet (10 mg total) by mouth daily. [name], MD  Active   ARIPiprazole (ABILIFY) 5 MG tablet [id number]  Take 1 tablet (5 mg total) by mouth daily. Sherene Sires, DO  Active   Aspirin-Salicylamide-Caffeine [facility] HEADACHE PO) [id number]  Take 1 packet by mouth daily as needed (headaches). [provider]  Active Self  busPIRone (BUSPAR) 5 MG tablet [id number]  Take 5 mg by mouth 2 (two) times daily. [provider]  Active   carvedilol (COREG) 25 MG tablet [id number]  Take 1 tablet (25 mg  total) by mouth 2 (two) times daily with a meal. [name], MD  Active   doxepin (SINEQUAN) 25 MG capsule [id number]  Take 25 mg by mouth 3 (three) times daily as needed. [provider]  Active   empagliflozin (JARDIANCE) 10 MG TABS tablet [id number]  Take 1 tablet (10 mg total) by mouth daily. [name], MD  Active   EPINEPHrine 0.3 mg/0.3 mL IJ SOAJ injection [id number]  Use as directed for severe  allergic reaction  Patient not taking: No sig reported   Garnet Sierras, DO  Active   fluconazole (DIFLUCAN) 200 MG tablet [id number]  Take 200 mg by mouth once a week. Once a week for 12 weeks. [provider]  Active   FLUoxetine (PROZAC) 10 MG tablet [id number] Yes Take 10 mg by mouth daily. [provider] Taking Active Self  Fluticasone Furoate (ARNUITY ELLIPTA) 100 MCG/ACT AEPB [id number]  Inhale 1 puff into the lungs daily. [name], MD  Active   glucose blood test strip [id number]  Test blood sugar daily and record on blood sugar log Sherene Sires, DO  Active   Lancets (ACCU-CHEK SOFT TOUCH) lancets [id number]  Check blood sugar daily and record in log Bland, Scott, DO  Active   lisinopril-hydrochlorothiazide (ZESTORETIC) 20-25 MG tablet [id number]  Take 1 tablet by mouth daily. [name], MD  Active            Med Note [facility] D   Tue Mar 22, 2020 10:07 AM) Resumed on 03/14/2020 per patient  meclizine (ANTIVERT) 25 MG tablet [id number]  Take 1 tablet (25 mg total) by mouth 3 (three) times daily as needed for dizziness. Lattie Haw, MD  Active   metFORMIN (GLUCOPHAGE-XR) 500 MG 24 hr tablet [id number]  TAKE 1 TABLET(500 MG) BY MOUTH DAILY WITH BREAKFAST Lattie Haw, MD  Active   mycophenolate (CELLCEPT) 500 MG tablet [id number]  Take 1 tablet by mouth 2 (two) times daily. Written by derm [provider]  Active   Oxycodone HCl 10 MG TABS [id number]  Take by mouth daily as needed. [provider]  Active   promethazine (PHENERGAN)  25 MG tablet [id number]  take 1 tablet by mouth every 8 hours if needed for nausea and vomiting  Patient not taking: Reported on 05/06/2020   Sherene Sires, DO  Active Self  rosuvastatin (CRESTOR) 10 MG tablet [id number]  Take 1 tablet (10 mg total) by mouth daily. [name], MD  Active   sertraline (ZOLOFT) 100 MG tablet [id number] No TAKE 1 TABLET(100 MG) BY MOUTH AT BEDTIME  Patient not taking: Reported on 05/24/2020   Lattie Haw, MD Not Taking Active            Med Note Leona Singleton   Tue May 24, 2020 10:23 AM) Reports medication changed by psychiatrist  tiZANidine (ZANAFLEX) 4 MG tablet [id number]  Take 4 mg by mouth 2 (two) times daily. [provider]  Active Self  triamcinolone ointment (KENALOG) 0.1 % [id number]  Apply 1 application topically 2 (two) times daily. [provider]  Active Self  VENTOLIN HFA 108 (90 Base) MCG/ACT inhaler [id number]  INHALE 2 PUFFS INTO THE LUNGS EVERY 6 HOURS AS NEEDED FOR WHEEZING OR SHORTNESS OF BREATH Criss Rosales, Scott, DO  Active   zolpidem (AMBIEN) 10 MG tablet [id number]  Take 10 mg by mouth at bedtime. [provider]  Active          Patient Active Problem List   Diagnosis Date Noted  . Stricture of ureter 01/04/2020  . Cirrhosis of liver ([place]) - h/o hep C 01/04/2020  . Degenerative joint disease (DJD) of lumbar spine 01/04/2020  . Monoclonal gammopathy of unknown significance (MGUS) 01/23/2019  . Osteoporosis with current pathological fracture 01/21/2019  . Controlled type 2 diabetes mellitus without complication, without long-term current use of insulin ([place]) 12/12/2018  . Hyperlipidemia associated with type 2 diabetes mellitus ([place]) 12/12/2018  . Seasonal and perennial  allergic rhinoconjunctivitis 02/12/2018  . History of hepatitis C 10/11/2017  . History of atrial fibrillation 10/11/2017  . Former smoker 10/11/2017  . Dyshidrotic eczema 09/10/2017  . GERD (gastroesophageal reflux disease) 02/15/2017  .  Psoriasis 07/10/2015  . Asthma with COPD ([place]) 05/25/2013  . 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 Obesity, Class II, BMI 35-39.9 03/07/2006  . Major depression, recurrent, chronic ([place]) 03/07/2006  . Essential hypertension 03/07/2006  . Chronic low back pain 03/07/2006  . Insomnia 03/07/2006   Immunization History  Administered Date(s) Administered  . Hepatitis B, adult 10/12/2014, 11/09/2014, 04/11/2015  . Influenza Split 11/14/2010, 10/09/2011  . Influenza Whole 10/13/2007, 01/10/2009, 11/22/2009  . Influenza,inj,Quad PF,6+ Mos 10/10/2012, 10/08/2013, 10/15/2014, 09/29/2015, 11/06/2016, 09/10/2017, 10/15/2018, 09/30/2019  . PFIZER(Purple Top)SARS-COV-2 Vaccination 03/20/2019, 04/15/2019, 10/15/2019  . Pneumococcal Polysaccharide-23 01/10/2009  . Td 01/09/1999, 01/10/2009    Conditions to be addressed/monitored:  Hypertension, Hyperlipidemia, Diabetes, Atrial Fibrillation, GERD, COPD, Chronic Kidney Disease, Hypothyroidism, Anxiety and Osteoporosis Care Plan : [place]  Updates made by Madelin Rear, [facility] since 05/31/2020 12:00 AM    Problem: Hypertension, Hyperlipidemia, Diabetes, Atrial Fibrillation, GERD, COPD, Chronic Kidney Disease, Hypothyroidism, Anxiety and Osteoporosis   Priority: High    Long-Range Goal: Disease Management   Start Date: 03/09/2020  Expected End Date: 03/22/2021  This Visit's Progress: On track  Recent Progress: On track  Priority: High  Note:   Hypertension (BP goal <140/90) -Not ideally controlled - improve 4/29 visit  --Current treatment: . Lisinopril-HCTZ 20-25 mg (resumed) . Amlodipine 10 mg once daily (started 02/15/2020) . Carvedilol 25 mg twice daily  -Current home readings: has BP cuff through [place] - at goal.  -Denies hypotensive/hypertensive symptoms -Educated on BP goals and benefits of medications for prevention of heart attack, stroke and kidney damage; Daily salt intake goal < 2300 mg; Exercise goal of 150 minutes per  week; Importance of home blood pressure monitoring; Proper BP monitoring technique; Symptoms of hypotension and importance of maintaining adequate hydration; -Counseled to monitor BP at home twice daily, document, and provide log at future appointments -Counseled on diet and exercise extensively   Asthma with COPD (Goal: control symptoms and prevent exacerbations) -Controlled -Current treatment   Ventolin HFA 2 puffs every 6 hours as needed for shortness of breath  Arnuity Ellipta 100 mcg/act - 1 puff into lungs daily -Exacerbations requiring treatment in last 6 months: 0 -Patient reports consistent use of maintenance inhaler -Frequency of rescue inhaler use: rare -Counseled on Proper inhaler technique; -Recommended to continue current medication  Requesting refill on ventolin   Diabetes (A1c goal <7%) -Controlled -Current medications: . Jardiance 10 mg once daily . Metformin XL 500 mg once daily  -Current home glucose readings . fasting glucose: 128, 130 - most recent readings -Denies hypoglycemic/hyperglycemic symptoms -Current meal patterns:  Portion control, watches juice consumption, some crackers for snack. -Current exercise: home chair exercises for back -Reviewed side effects - no problems noted -Counseled on diet and exercise extensively Recommended to continue current medication      Medication Assistance: None required at this time, will discuss further at f/u Patient's preferred pharmacy is:  [facility] [place], [place] - [address] [address] Phone: [phone] Fax: [phone]  Patient agrees to Care Plan and Follow-up.  Patient Goals/Self-Care Activities . Over the next 365 days, patient will:  - take medications as prescribed target a minimum of 150 minutes of  moderate intensity exercise weekly  Future Appointments  Date Time Provider [place]   05/31/2020  1:00 PM Madelin Rear, [facility] P[facility]  06/30/2020 10:00 AM [facility] CARE [facility]  08/05/2020 11:00 AM [name], MD [facility]    [facility] [phone] - [place], [address] [address] Phone: [phone] Fax: [phone]  Follow-up plan with Care Management Team: RPH: f/u - 3 month telephone visit  Madelin Rear, PharmD, CPP Clinical Pharmacist Practitioner  [facility]  [phone]

## 2020-05-31 NOTE — Patient Instructions (Signed)
Stacy Campbell,  Thank you for talking with me today. I have included our care plan/goals in the following pages.   Please review and call me at [phone] with any questions.  Thanks! [name], Pharm.D., BCGP Clinical Pharmacist [facility] [place] [phone] Patient Care Plan: Diabetes Type 2 (Adult)    Problem Identified: Patient will maintain Hgb A1C of below 7 within the next 90 days.   Priority: Medium    Long-Range Goal: Patient will maintain Hgb A1C of below 7 within the next 90 days   Start Date: 03/08/2020  Expected End Date: 09/06/2020  This Visit's Progress: On track  Recent Progress: On track  Priority: Medium  Note:   Objective:  Lab Results  Component Value Date   HGBA1C 6.9 (A) 05/06/2020 .   Lab Results  Component Value Date   CREATININE 1.29 (H) 05/06/2020   CREATININE 1.53 (H) 02/15/2020   CREATININE 0.80 01/04/2020   Current Barriers:  Marland Kitchen Knowledge Deficits related to basic Diabetes pathophysiology and self care/management.  Patient's current Hgb A1C level slightly increased to 6.9.   Continues to monitor blood sugars twice a day.  Fasting blood sugar this morning was 130 with recent fasting ranges of 120-140's.  Denies any hypo or hyperglycemic episodes.  Reports compliance with all medications.  States she did attend her eye exam on 04/12/20 with Dr. Ronnald Ramp at [facility].  Concerned about weight gain, discussed increasing activity as back pain allows. . Difficulties managing chronic conditions related to pain and anxiety.  Attends pain management clinic to manage chronic back pain. Reports pain limits activity and prevents her from spending time with her family.  Patient states she continues to see therapist and psychiatrist monthly for her anxiety and depression.   Case Manager Clinical Goal(s):  Marland Kitchen Patient will demonstrate improved adherence to prescribed treatment plan for diabetes self  care/management as evidenced by: daily monitoring and recording of CBG,  adherence to ADA/ carb modified diet, adherence to prescribed medication regimen, contacting provider for new or worsened symptoms or questions Interventions:  . Collaboration with [name], MD regarding development and update of comprehensive plan of care as evidenced by provider attestation and co-signature . Inter-disciplinary care team collaboration (see longitudinal plan of care) . Reviewed medications with patient and discussed importance of medication adherence . Discussed plans with patient for ongoing care management follow up and provided patient with direct contact information for care management team . Advised patient, providing education and rationale, to check cbg twice a day and record, calling primary care provider for findings outside established parameters.   . Congratulated patient on current Hgb A1C and discussed ways to maintain levels . Discussed weight gain/loss and encouraged to increase activity as back pain allows . Encouraged to discuss pain limiting effects with pain management provider . Discussed and encouraged heart healthy carbohydrate modified diet/food options; sent education on diabetic diet/food choices . Encouraged patient to request eye exam be faxed to primary care provider . Encouraged to attend scheduled medical appointments . Sending [facility] Exercise Program Activity Booklet . No significant recommendations/changes Patient Goals/Self-Care Activities: . Continue to check blood sugars twice a day . Enter blood sugar readings into daily log . Take the blood sugar log to all doctor visits  . Request [facility] [employer] to fax eye exam results to Dr. Jonni Sanger office . Increase activity slowly as tolerated Follow Up Plan: The care management team will reach out to  the patient again over the next 45 business days.      Patient Care Plan: Hypertension (Adult)    Problem Identified:  Hypertension (Hypertension)   Priority: Medium    Long-Range Goal: Hypertension Monitored   Start Date: 03/08/2020  Expected End Date: 09/06/2020  This Visit's Progress: On track  Recent Progress: On track  Priority: Medium  Note:   Current Barriers:  Marland Kitchen Knowledge Deficits related to basic understanding of hypertension pathophysiology and self care management.  Patient reporting latest BP reading 119/73, with recent ranges of 110-120/70-80's. Denies any light headiness or dizziness at this time.  Denies shortness of breath or swelling.   . Difficulties maintaining food in the home.  Reports having trouble affording food and often times about to run out of food before having money to buy more.  Has spoken with care guide and has received list of possible food banks, she still needs to review Nurse Case Manager Clinical Goal(s):  Marland Kitchen Over the next 90 days, patient will verbalize understanding of plan for hypertension management . Over the next 90 days, patient will demonstrate improved adherence to prescribed treatment plan for hypertension as evidenced by taking all medications as prescribed, monitoring and recording blood pressure as directed, adhering to low sodium/DASH diet Interventions:  . Collaboration with [name], MD regarding development and update of comprehensive plan of care as evidenced by provider attestation and co-signature . Inter-disciplinary care team collaboration (see longitudinal plan of care) . Evaluation of current treatment plan related to hypertension self management and patient's adherence to plan as established by provider. . Reviewed medications with patient and discussed importance of compliance . Encouraged patient to contact CCM Pharmacist or RNCM for any questions or concerns . Discussed plans with patient for ongoing care management follow up and provided patient with direct contact information for care management team . Advised patient, providing education  and rationale, to monitor blood pressure daily and record, calling PCP for findings outside established parameters.  . Reviewed signs and symptoms of hypotension and hypertension; encouraged patient to review previous education sent . Encouraged patient to review list of food banks provided by [employer] Guides . Encouraged her to continue to use Calendar Booklet to log blood pressure readings for provider review . Encouraged low salt carbohydrate modified diabetic diet, sending education on DASH diet . Encouraged to continue with therapy and discussed not stopping depression medication . Discussed increasing activity as tolerated . No significant recommendations/changes Patient Goals/Self-Care Activities:  Over the next 45 days, patient will: . Call 211 when I need some help . Follow-up on any referrals for help I am given . Make a list of family or friends that I can call  . Review list of agencies to assist with food given by the Care Guide . Check blood pressure daily  . Write blood pressure results in a log  . Monitor yourself for hypotension and hypertension . Follow low salt carbohydrate modified diabetic diet Follow Up Plan: The care management team will reach out to the patient again over the next 45 business days.       Patient Care Plan: CCM Pharmacy Care Plan    Problem Identified: Hypertension, Hyperlipidemia, Diabetes, Atrial Fibrillation, GERD, COPD, Chronic Kidney Disease, Hypothyroidism, Anxiety and Osteoporosis   Priority: High    Long-Range Goal: Disease Management   Start Date: 03/09/2020  Expected End Date: 03/22/2021  This Visit's Progress: On track  Recent Progress: On track  Priority: High  Note:  Current Barriers:  . Work to optimize lifestyle choices through diet and exercise  Pharmacist Clinical Goal(s):  Marland Kitchen Over the next 365 days, patient will verbalize ability to afford treatment regimen . achieve adherence to monitoring guidelines and medication  adherence to achieve therapeutic efficacy . contact provider office for questions/concerns as evidenced notation of same in electronic health record through collaboration with PharmD and provider.   Interventions: . 1:1 collaboration with [name], MD regarding development and update of comprehensive plan of care as evidenced by provider attestation and co-signature . Inter-disciplinary care team collaboration (see longitudinal plan of care) . Comprehensive medication review performed; medication list updated in electronic medical record . No Rx changes - albuterol sent to pharmacy  Hypertension (BP goal <140/90) -Not ideally controlled - improve 4/29 visit  --Current treatment: . Lisinopril-HCTZ 20-25 mg (resumed) . Amlodipine 10 mg once daily (started 02/15/2020) . Carvedilol 25 mg twice daily  -Current home readings: has BP cuff through [place] - at goal.  -Denies hypotensive/hypertensive symptoms -Educated on BP goals and benefits of medications for prevention of heart attack, stroke and kidney damage; Daily salt intake goal < 2300 mg; Exercise goal of 150 minutes per week; Importance of home blood pressure monitoring; Proper BP monitoring technique; Symptoms of hypotension and importance of maintaining adequate hydration; -Counseled to monitor BP at home twice daily, document, and provide log at future appointments -Counseled on diet and exercise extensively   Asthma with COPD (Goal: control symptoms and prevent exacerbations) -Controlled -Current treatment   Ventolin HFA 2 puffs every 6 hours as needed for shortness of breath  Arnuity Ellipta 100 mcg/act - 1 puff into lungs daily -Exacerbations requiring treatment in last 6 months: 0 -Patient reports consistent use of maintenance inhaler -Frequency of rescue inhaler use: rare -Counseled on Proper inhaler technique; -Recommended to continue current medication  Requesting refill on ventolin   Diabetes (A1c goal  <7%) -Controlled -Current medications: . Jardiance 10 mg once daily . Metformin XL 500 mg once daily  -Current home glucose readings . fasting glucose: 128, 130 - most recent readings -Denies hypoglycemic/hyperglycemic symptoms -Current meal patterns:  Portion control, watches juice consumption, some crackers for snack. -Current exercise: home chair exercises for back -Reviewed side effects - no problems noted -Counseled on diet and exercise extensively Recommended to continue current medication      The patient verbalized understanding of instructions provided today and agreed to receive a MyChart copy of patient instruction and/or educational materials. Telephone follow up appointment with pharmacy team member scheduled for: See next appointment with "Care Management Staff" under "What's Next" below.    Hypertension, Adult High blood pressure (hypertension) is when the force of blood pumping through the arteries is too strong. The arteries are the blood vessels that carry blood from the heart throughout the body. Hypertension forces the heart to work harder to pump blood and may cause arteries to become narrow or stiff. Untreated or uncontrolled hypertension can cause a heart attack, heart failure, a stroke, kidney disease, and other problems. A blood pressure reading consists of a higher number over a lower number. Ideally, your blood pressure should be below 120/80. The first ("top") number is called the systolic pressure. It is a measure of the pressure in your arteries as your heart beats. The second ("bottom") number is called the diastolic pressure. It is a measure of the pressure in your arteries as the heart relaxes. What are the causes? The exact cause of this condition is not  known. There are some conditions that result in or are related to high blood pressure. What increases the risk? Some risk factors for high blood pressure are under your control. The following factors may  make you more likely to develop this condition:  Smoking.  Having type 2 diabetes mellitus, high cholesterol, or both.  Not getting enough exercise or physical activity.  Being overweight.  Having too much fat, sugar, calories, or salt (sodium) in your diet.  Drinking too much alcohol. Some risk factors for high blood pressure may be difficult or impossible to change. Some of these factors include:  Having chronic kidney disease.  Having a family history of high blood pressure.  Age. Risk increases with age.  Race. You may be at higher risk if you are African American.  Gender. Men are at higher risk than women before age 86. After age 51, women are at higher risk than men.  Having obstructive sleep apnea.  Stress. What are the signs or symptoms? High blood pressure may not cause symptoms. Very high blood pressure (hypertensive crisis) may cause:  Headache.  Anxiety.  Shortness of breath.  Nosebleed.  Nausea and vomiting.  Vision changes.  Severe chest pain.  Seizures. How is this diagnosed? This condition is diagnosed by measuring your blood pressure while you are seated, with your arm resting on a flat surface, your legs uncrossed, and your feet flat on the floor. The cuff of the blood pressure monitor will be placed directly against the skin of your upper arm at the level of your heart. It should be measured at least twice using the same arm. Certain conditions can cause a difference in blood pressure between your right and left arms. Certain factors can cause blood pressure readings to be lower or higher than normal for a short period of time:  When your blood pressure is higher when you are in a health care provider's office than when you are at home, this is called white coat hypertension. Most people with this condition do not need medicines.  When your blood pressure is higher at home than when you are in a health care provider's office, this is called  masked hypertension. Most people with this condition may need medicines to control blood pressure. If you have a high blood pressure reading during one visit or you have normal blood pressure with other risk factors, you may be asked to:  Return on a different day to have your blood pressure checked again.  Monitor your blood pressure at home for 1 week or longer. If you are diagnosed with hypertension, you may have other blood or imaging tests to help your health care provider understand your overall risk for other conditions. How is this treated? This condition is treated by making healthy lifestyle changes, such as eating healthy foods, exercising more, and reducing your alcohol intake. Your health care provider may prescribe medicine if lifestyle changes are not enough to get your blood pressure under control, and if:  Your systolic blood pressure is above 130.  Your diastolic blood pressure is above 80. Your personal target blood pressure may vary depending on your medical conditions, your age, and other factors. Follow these instructions at home: Eating and drinking  Eat a diet that is high in fiber and potassium, and low in sodium, added sugar, and fat. An example eating plan is called the DASH (Dietary Approaches to Stop Hypertension) diet. To eat this way: ? Eat plenty of fresh fruits and vegetables. Try  to fill one half of your plate at each meal with fruits and vegetables. ? Eat whole grains, such as whole-wheat pasta, brown rice, or whole-grain bread. Fill about one fourth of your plate with whole grains. ? Eat or drink low-fat dairy products, such as skim milk or low-fat yogurt. ? Avoid fatty cuts of meat, processed or cured meats, and poultry with skin. Fill about one fourth of your plate with lean proteins, such as fish, chicken without skin, beans, eggs, or tofu. ? Avoid pre-made and processed foods. These tend to be higher in sodium, added sugar, and fat.  Reduce your daily  sodium intake. Most people with hypertension should eat less than 1,500 mg of sodium a day.  Do not drink alcohol if: ? Your health care provider tells you not to drink. ? You are pregnant, may be pregnant, or are planning to become pregnant.  If you drink alcohol: ? Limit how much you use to:  0-1 drink a day for women.  0-2 drinks a day for men. ? Be aware of how much alcohol is in your drink. In the [place] one drink equals one 12 oz bottle of beer (355 mL), one 5 oz glass of wine (148 mL), or one 1 oz glass of hard liquor (44 mL).   Lifestyle  Work with your health care provider to maintain a healthy body weight or to lose weight. Ask what an ideal weight is for you.  Get at least 30 minutes of exercise most days of the week. Activities may include walking, swimming, or biking.  Include exercise to strengthen your muscles (resistance exercise), such as Pilates or lifting weights, as part of your weekly exercise routine. Try to do these types of exercises for 30 minutes at least 3 days a week.  Do not use any products that contain nicotine or tobacco, such as cigarettes, e-cigarettes, and chewing tobacco. If you need help quitting, ask your health care provider.  Monitor your blood pressure at home as told by your health care provider.  Keep all follow-up visits as told by your health care provider. This is important.   Medicines  Take over-the-counter and prescription medicines only as told by your health care provider. Follow directions carefully. Blood pressure medicines must be taken as prescribed.  Do not skip doses of blood pressure medicine. Doing this puts you at risk for problems and can make the medicine less effective.  Ask your health care provider about side effects or reactions to medicines that you should watch for. Contact a health care provider if you:  Think you are having a reaction to a medicine you are taking.  Have headaches that keep coming back  (recurring).  Feel dizzy.  Have swelling in your ankles.  Have trouble with your vision. Get help right away if you:  Develop a severe headache or confusion.  Have unusual weakness or numbness.  Feel faint.  Have severe pain in your chest or abdomen.  Vomit repeatedly.  Have trouble breathing. Summary  Hypertension is when the force of blood pumping through your arteries is too strong. If this condition is not controlled, it may put you at risk for serious complications.  Your personal target blood pressure may vary depending on your medical conditions, your age, and other factors. For most people, a normal blood pressure is less than 120/80.  Hypertension is treated with lifestyle changes, medicines, or a combination of both. Lifestyle changes include losing weight, eating a healthy, low-sodium diet, exercising  more, and limiting alcohol. This information is not intended to replace advice given to you by your health care provider. Make sure you discuss any questions you have with your health care provider. Document Revised: 09/04/2017 Document Reviewed: 09/04/2017 Elsevier Patient Education  2021 [employer].

## 2020-06-28 ENCOUNTER — Telehealth: Payer: Medicare HMO

## 2020-06-30 ENCOUNTER — Ambulatory Visit (INDEPENDENT_AMBULATORY_CARE_PROVIDER_SITE_OTHER): Payer: Medicare HMO | Admitting: *Deleted

## 2020-06-30 DIAGNOSIS — I10 Essential (primary) hypertension: Secondary | ICD-10-CM

## 2020-06-30 DIAGNOSIS — E119 Type 2 diabetes mellitus without complications: Secondary | ICD-10-CM | POA: Diagnosis not present

## 2020-06-30 NOTE — Chronic Care Management (AMB) (Signed)
Chronic Care Management   CCM RN Visit Note  06/30/2020 Name: Stacy Campbell MRN: [id number] DOB: 1955-09-19  Subjective: Stacy Campbell is a 64 y.o. year old female who is a primary care patient of [name], MD. The care management team was consulted for assistance with disease management and care coordination needs.    Engaged with patient by telephone for follow up visit in response to provider referral for case management and/or care coordination services.   Consent to Services:  The patient was given information about Chronic Care Management services, agreed to services, and gave verbal consent prior to initiation of services.  Please see initial visit note for detailed documentation.   Patient agreed to services and verbal consent obtained.   Assessment: Review of patient past medical history, allergies, medications, health status, including review of consultants reports, laboratory and other test data, was performed as part of comprehensive evaluation and provision of chronic care management services.   SDOH (Social Determinants of Health) assessments and interventions performed:    CCM Care Plan  Allergies  Allergen Reactions   Hydrocodone Itching   Linalool    Methylisothiazolinone Other (See Comments)    Other reaction(s): Other (See Comments) Positive patch test Positive patch test    Propylene Glycol    Aspirin Itching and Other (See Comments)    Lower dose doesn't make her itch (she takes it every day)   Eggs Or Egg-Derived Products Nausea Only and Other (See Comments)    No reaction if in another food   Hydrocodone-Guaifenesin Rash    Outpatient Encounter Medications as of 06/30/2020  Medication Sig Note   albuterol (VENTOLIN HFA) 108 (90 Base) MCG/ACT inhaler INHALE 2 PUFFS INTO THE LUNGS EVERY 6 HOURS AS NEEDED FOR WHEEZING OR SHORTNESS OF BREATH    empagliflozin (JARDIANCE) 10 MG TABS tablet Take 1 tablet (10 mg total) by mouth daily.     FLUoxetine (PROZAC) 20 MG capsule Take 1 capsule (20 mg total) by mouth daily.    meclizine (ANTIVERT) 25 MG tablet Take 1 tablet (25 mg total) by mouth 3 (three) times daily as needed for dizziness.    metFORMIN (GLUCOPHAGE-XR) 500 MG 24 hr tablet TAKE 1 TABLET(500 MG) BY MOUTH DAILY WITH BREAKFAST    amLODipine (NORVASC) 10 MG tablet Take 1 tablet (10 mg total) by mouth daily.    ARIPiprazole (ABILIFY) 5 MG tablet Take 1 tablet (5 mg total) by mouth daily.    busPIRone (BUSPAR) 5 MG tablet Take 5 mg by mouth 2 (two) times daily.    carvedilol (COREG) 25 MG tablet Take 1 tablet (25 mg total) by mouth 2 (two) times daily with a meal.    doxepin (SINEQUAN) 25 MG capsule Take 25 mg by mouth 3 (three) times daily as needed.    EPINEPHrine 0.3 mg/0.3 mL IJ SOAJ injection Use as directed for severe allergic reaction (Patient not taking: No sig reported)    Fluticasone Furoate (ARNUITY ELLIPTA) 100 MCG/ACT AEPB Inhale 1 puff into the lungs daily.    glucose blood test strip Test blood sugar daily and record on blood sugar log    Lancets (ACCU-CHEK SOFT TOUCH) lancets Check blood sugar daily and record in log    lisinopril-hydrochlorothiazide (ZESTORETIC) 20-25 MG tablet Take 1 tablet by mouth daily. 03/22/2020: Resumed on 03/14/2020 per patient   mycophenolate (CELLCEPT) 500 MG tablet Take 1 tablet by mouth 2 (two) times daily. Written by derm    Oxycodone HCl 10 MG TABS  Take by mouth daily as needed.    promethazine (PHENERGAN) 25 MG tablet take 1 tablet by mouth every 8 hours if needed for nausea and vomiting (Patient not taking: Reported on 05/06/2020)    rosuvastatin (CRESTOR) 10 MG tablet Take 1 tablet (10 mg total) by mouth daily.    sertraline (ZOLOFT) 100 MG tablet TAKE 1 TABLET(100 MG) BY MOUTH AT BEDTIME (Patient not taking: No sig reported) 05/24/2020: Reports medication changed by psychiatrist   tiZANidine (ZANAFLEX) 4 MG tablet Take 4 mg by mouth 2 (two) times daily.    triamcinolone ointment  (KENALOG) 0.1 % Apply 1 application topically 2 (two) times daily.    zolpidem (AMBIEN) 10 MG tablet Take 10 mg by mouth at bedtime.    No facility-administered encounter medications on file as of 06/30/2020.    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   Cirrhosis of liver ([place]) - h/o hep C 01/04/2020   Degenerative joint disease (DJD) of lumbar spine 01/04/2020   Monoclonal gammopathy of unknown significance (MGUS) 01/23/2019   Osteoporosis with current pathological fracture 01/21/2019   Controlled type 2 diabetes mellitus without complication, without long-term current use of insulin ([place]) 12/12/2018   Hyperlipidemia associated with type 2 diabetes mellitus ([place]) 12/12/2018   Seasonal and perennial allergic rhinoconjunctivitis 02/12/2018   History of hepatitis C 10/11/2017   History of atrial fibrillation 10/11/2017   Former smoker 10/11/2017   Dyshidrotic eczema 09/10/2017   GERD (gastroesophageal reflux disease) 02/15/2017   Psoriasis 07/10/2015   Asthma with COPD ([place]) 05/25/2013   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   Obesity, Class II, BMI 35-39.9 03/07/2006   Major depression, recurrent, chronic ([place]) 03/07/2006   Essential hypertension 03/07/2006   Chronic low back pain 03/07/2006   Insomnia 03/07/2006    Conditions to be addressed/monitored:HTN and DMII  Care Plan : Diabetes Type 2 (Adult)  Updates made by Leona Singleton, RN since 06/30/2020 12:00 AM     Problem: Patient will maintain Hgb A1C of below 7 within the next 90 days.   Priority: Medium     Long-Range Goal: Patient will maintain Hgb A1C of below 7 within the next 90 days   Start Date: 03/08/2020  Expected End Date: 01/06/2021  This Visit's Progress: On track  Recent Progress: On track  Priority: Medium  Note:   Objective:  Lab Results  Component Value Date   HGBA1C 6.9 (A) 05/06/2020   Lab Results  Component Value Date   CREATININE 1.29 (H) 05/06/2020    CREATININE 1.53 (H) 02/15/2020   CREATININE 0.80 01/04/2020  Current Barriers:  Knowledge Deficits related to basic Diabetes pathophysiology and self care/management.  Patient's current Hgb A1C level slightly increased to 6.9.   Continues to monitor blood sugars twice a day.  Fasting blood sugar this morning was 120 with recent fasting ranges of 120-130's.  Denies any hypo or hyperglycemic episodes.  Reports compliance with all medications.  States she did attend her eye exam on 04/12/20 with Dr. Ronnald Ramp at [facility].  Concerned about weight gain, discussed increasing activity as back pain allows.  Patient is voicing some concern over bladder issues.  States she will void, fry herself and stand to pull up her pants and start voiding again.  States this has been happening about a week.  Denies any burning or pain.  States she feels like she is emptying her bladder on the first void.  Also states she is starting to  notice she has some bladder "leaks". Difficulties managing chronic conditions related to pain and anxiety.  Attends pain management clinic to manage chronic back pain. Reports pain limits activity and prevents her from spending time with her family.  Patient states she continues to see therapist and psychiatrist monthly for her anxiety and depression.   Case Manager Clinical Goal(s):  Patient will demonstrate improved adherence to prescribed treatment plan for diabetes self care/management as evidenced by: daily monitoring and recording of CBG,  adherence to ADA/ carb modified diet, adherence to prescribed medication regimen, contacting provider for new or worsened symptoms or questions Interventions:  Collaboration with [name], MD regarding development and update of comprehensive plan of care as evidenced by provider attestation and co-signature Inter-disciplinary care team collaboration (see longitudinal plan of care) Reviewed medications with patient and discussed importance of  medication adherence Discussed plans with patient for ongoing care management follow up and provided patient with direct contact information for care management team Advised patient, providing education and rationale, to check cbg twice a day and record, calling primary care provider for findings outside established parameters.   Congratulated patient on current Hgb A1C and discussed ways to maintain levels Discussed weight gain/loss and encouraged to increase activity as back pain allows Encouraged to discuss pain limiting effects with pain management provider Discussed and encouraged heart healthy carbohydrate modified diet/food options; sent education on diabetic diet/food choices Encouraged patient to request eye exam be faxed to primary care provider Encouraged to attend scheduled medical appointments Sent [facility] Exercise Program Activity Booklet and encouraged patient to use Discussed voiding issues with patient and encouraged to contact provider with concerns; also reviewed and discussed using incontinence supplies/pads; encouraged to shift body/hips  and to sit for an additional 20-30 seconds after first void to make sure bladder is empty No significant recommendations/changes Patient Goals/Self-Care Activities: Continue to check blood sugars twice a day Enter blood sugar readings into daily log Take the blood sugar log to all doctor visits  Request [employer] to fax eye exam results to Dr. Jonni Sanger office Increase activity slowly as tolerated Contact provider if bladder/voiding issues continues or gets worse Follow Up Plan: The care management team will reach out to the patient again over the next 45 business days.       Care Plan : Hypertension (Adult)  Updates made by Leona Singleton, RN since 06/30/2020 12:00 AM     Problem: Hypertension (Hypertension)   Priority: Medium     Long-Range Goal: Hypertension Monitored   Start Date: 03/08/2020  Expected End Date: 09/06/2020  This  Visit's Progress: On track  Recent Progress: On track  Priority: Medium  Note:   Current Barriers:  Knowledge Deficits related to basic understanding of hypertension pathophysiology and self care management.  Patient reporting latest BP reading 137/76, with recent ranges of 120-150/70-80's. Denies any light headiness or dizziness at this time.  Denies shortness of breath or swelling. Reports using rescue inhaler about twice a day, usually with activity.    Difficulties maintaining food in the home.  Continues to report having trouble affording food and often times about to run out of food before having money to buy more.  Has spoken with care guide and has received list of food banks.  Does not like the food with Me[employer] on Wheels.  Discussed DSNP benefit with [employer] of healthy foods $75 a month and encouraged patient to contact her insurance company. Nurse Case Manager Clinical Goal(s):  Over the next  90 days, patient will verbalize understanding of plan for hypertension management Over the next 90 days, patient will demonstrate improved adherence to prescribed treatment plan for hypertension as evidenced by taking all medications as prescribed, monitoring and recording blood pressure as directed, adhering to low sodium/DASH diet Interventions:  Collaboration with [name], MD regarding development and update of comprehensive plan of care as evidenced by provider attestation and co-signature Inter-disciplinary care team collaboration (see longitudinal plan of care) Evaluation of current treatment plan related to hypertension self management and patient's adherence to plan as established by provider. Reviewed medications with patient and discussed importance of compliance Encouraged patient to contact CCM Pharmacist or RNCM for any questions or concerns Discussed plans with patient for ongoing care management follow up and provided patient with direct contact information for care management  team Advised patient, providing education and rationale, to monitor blood pressure daily and record, calling PCP for findings outside established parameters.  Reviewed signs and symptoms of hypotension and hypertension; encouraged patient to review previous education sent Verified patient has list of food banks and encouraged patient to review/contact list of food banks provided by [facility] Guides Encouraged her to continue to use Calendar Booklet to log blood pressure readings for provider review Encouraged low salt carbohydrate modified diabetic diet, sent education on DASH diet Encouraged to continue with therapy and discussed not stopping depression medication Discussed increasing activity as tolerated Discussed healthy food $75 a month allowance from [place] benefit and encouraged patient to contact [employer] to request benefit or verify benefit No significant recommendations/changes Patient Goals/Self-Care Activities:  Over the next 45 days, patient will: Call 211 when I need some help Follow-up on any referrals for help I am given Make a list of family or friends that I can call  Review list of agencies to assist with food given by the [place] to ask about $75 food allowance with DSNP benefit Check blood pressure daily  Write blood pressure results in a log  Monitor yourself for hypotension and hypertension Follow low salt carbohydrate modified diabetic diet Follow Up Plan: The care management team will reach out to the patient again over the next 45 business days.         Plan:The care management team will reach out to the patient again over the next 45 business days.  Hubert Azure RN, MSN RN Care Management Coordinator  [place] [phone] [email or web address]

## 2020-06-30 NOTE — Patient Instructions (Signed)
Visit Information  PATIENT GOALS:  Goals Addressed             This Visit's Progress    (RNCM) Find Help in My Community   On track    Timeframe:  Short-Term Goal Priority:  High Start Date:   03/08/20                          Expected End Date:   09/06/20                    Follow Up Date 08/11/20    Call 211 when I need some help Follow-up on any referrals for help I am given Make a list of family or friends that I can call  Review list of agencies to assist with food given by the [place] to ask about $75 food allowance with DSNP benefit   Why is this important?   Knowing how and where to find help for yourself or family in your neighborhood and community is an important skill.  You will want to take some steps to learn how.    Notes:       (RNCM) Monitor and Manage My Blood Sugar-Diabetes Type 2   On track    Timeframe:  Long-Range Goal Priority:  Medium Start Date:   03/08/20                          Expected End Date:   01/06/21                    Follow Up Date 08/11/20    Continue to check blood sugars twice a day Enter blood sugar readings into daily log Take the blood sugar log to all doctor visits  Request [employer] to fax eye exam results to Dr. Jonni Sanger office Increase activity slowly as tolerated Contact provider if bladder/voiding issues continues or gets worse   Why is this important?   Checking your blood sugar at home helps to keep it from getting very high or very low.  Writing the results in a diary or log helps the doctor know how to care for you.  Your blood sugar log should have the time, date and the results.  Also, write down the amount of insulin or other medicine that you take.  Other information, like what you ate, exercise done and how you were feeling, will also be helpful.     Notes:       (RNCM) Track and Manage My Blood Pressure-Hypertension   On track    Timeframe:  Long-Range Goal Priority:  Medium Start Date:   03/08/20                           Expected End Date:   01/06/21                    Follow Up Date 08/11/20    Check blood pressure daily  Write blood pressure results in a log  Monitor yourself for hypotension and hypertension Follow low salt carbohydrate modified diabetic diet   Why is this important?   You won't feel high blood pressure, but it can still hurt your blood vessels.  High blood pressure can cause heart or kidney problems. It can also cause a stroke.  Making lifestyle changes like losing a little  weight or eating less salt will help.  Checking your blood pressure at home and at different times of the day can help to control blood pressure.  If the doctor prescribes medicine remember to take it the way the doctor ordered.  Call the office if you cannot afford the medicine or if there are questions about it.     Notes:          Patient verbalizes understanding of instructions provided today and agrees to view in [place].   The care management team will reach out to the patient again over the next 45 business days.   Hubert Azure RN, MSN RN Care Management Coordinator  [facility] [phone] [email or web address]

## 2020-07-08 ENCOUNTER — Other Ambulatory Visit: Payer: Self-pay | Admitting: Family Medicine

## 2020-07-15 ENCOUNTER — Ambulatory Visit (INDEPENDENT_AMBULATORY_CARE_PROVIDER_SITE_OTHER): Payer: Medicare HMO

## 2020-07-15 DIAGNOSIS — Z0000 Encounter for general adult medical examination without abnormal findings: Secondary | ICD-10-CM

## 2020-07-15 NOTE — Patient Instructions (Signed)
Stacy Campbell , Thank you for taking time to come for your Medicare Wellness Visit. I appreciate your ongoing commitment to your health goals. Please review the following plan we discussed and let me know if I can assist you in the future.   Screening recommendations/referrals: Colonoscopy: Done 12/15/19 repeat in 10 years 12/14/29 Mammogram: Done 04/29/20 repeat every year Bone Density: Done 04/22/19 repeat in 2 years  Recommended yearly ophthalmology/optometry visit for glaucoma screening and checkup Recommended yearly dental visit for hygiene and checkup  Vaccinations: Influenza vaccine: Due 08/08/20 Pneumococcal vaccine: Due and discussed Tdap vaccine: Due and discussed Shingles vaccine: Shingrix discussed. Please contact your pharmacy for coverage information.   Covid-19: Completed 3/12, 4/7, & 10/15/19  Advanced directives: Advance directive discussed with you today. I have provided a copy for you to complete at home and have notarized. Once this is complete please bring a copy in to our office so we can scan it into your chart.  Conditions/risks identified: Lose weight   Next appointment: Follow up in one year for your annual wellness visit.   Preventive Care 40-64 Years, Female Preventive care refers to lifestyle choices and visits with your health care provider that can promote health and wellness. What does preventive care include? A yearly physical exam. This is also called an annual well check. Dental exams once or twice a year. Routine eye exams. Ask your health care provider how often you should have your eyes checked. Personal lifestyle choices, including: Daily care of your teeth and gums. Regular physical activity. Eating a healthy diet. Avoiding tobacco and drug use. Limiting alcohol use. Practicing safe sex. Taking low-dose aspirin daily starting at age 30. Taking vitamin and mineral supplements as recommended by your health care provider. What happens during an  annual well check? The services and screenings done by your health care provider during your annual well check will depend on your age, overall health, lifestyle risk factors, and family history of disease. Counseling  Your health care provider may ask you questions about your: Alcohol use. Tobacco use. Drug use. Emotional well-being. Home and relationship well-being. Sexual activity. Eating habits. Work and work Statistician. Method of birth control. Menstrual cycle. Pregnancy history. Screening  You may have the following tests or measurements: Height, weight, and BMI. Blood pressure. Lipid and cholesterol levels. These may be checked every 5 years, or more frequently if you are over 27 years old. Skin check. Lung cancer screening. You may have this screening every year starting at age 34 if you have a 30-pack-year history of smoking and currently smoke or have quit within the past 15 years. Fecal occult blood test (FOBT) of the stool. You may have this test every year starting at age 70. Flexible sigmoidoscopy or colonoscopy. You may have a sigmoidoscopy every 5 years or a colonoscopy every 10 years starting at age 52. Hepatitis C blood test. Hepatitis B blood test. Sexually transmitted disease (STD) testing. Diabetes screening. This is done by checking your blood sugar (glucose) after you have not eaten for a while (fasting). You may have this done every 1-3 years. Mammogram. This may be done every 1-2 years. Talk to your health care provider about when you should start having regular mammograms. This may depend on whether you have a family history of breast cancer. BRCA-related cancer screening. This may be done if you have a family history of breast, ovarian, tubal, or peritoneal cancers. Pelvic exam and Pap test. This may be done every 3 years starting at  age 65. Starting at age 2, this may be done every 5 years if you have a Pap test in combination with an HPV test. Bone  density scan. This is done to screen for osteoporosis. You may have this scan if you are at high risk for osteoporosis. Discuss your test results, treatment options, and if necessary, the need for more tests with your health care provider. Vaccines  Your health care provider may recommend certain vaccines, such as: Influenza vaccine. This is recommended every year. Tetanus, diphtheria, and acellular pertussis (Tdap, Td) vaccine. You may need a Td booster every 10 years. Zoster vaccine. You may need this after age 67. Pneumococcal 13-valent conjugate (PCV13) vaccine. You may need this if you have certain conditions and were not previously vaccinated. Pneumococcal polysaccharide (PPSV23) vaccine. You may need one or two doses if you smoke cigarettes or if you have certain conditions. Talk to your health care provider about which screenings and vaccines you need and how often you need them. This information is not intended to replace advice given to you by your health care provider. Make sure you discuss any questions you have with your health care provider. Document Released: 01/21/2015 Document Revised: 09/14/2015 Document Reviewed: 10/26/2014 Elsevier Interactive Patient Education  2017 [place] Prevention in the Home Falls can cause injuries. They can happen to people of all ages. There are many things you can do to make your home safe and to help prevent falls. What can I do on the outside of my home? Regularly fix the edges of walkways and driveways and fix any cracks. Remove anything that might make you trip as you walk through a door, such as a raised step or threshold. Trim any bushes or trees on the path to your home. Use bright outdoor lighting. Clear any walking paths of anything that might make someone trip, such as rocks or tools. Regularly check to see if handrails are loose or broken. Make sure that both sides of any steps have handrails. Any raised decks and  porches should have guardrails on the edges. Have any leaves, snow, or ice cleared regularly. Use sand or salt on walking paths during winter. Clean up any spills in your garage right away. This includes oil or grease spills. What can I do in the bathroom? Use night lights. Install grab bars by the toilet and in the tub and shower. Do not use towel bars as grab bars. Use non-skid mats or decals in the tub or shower. If you need to sit down in the shower, use a plastic, non-slip stool. Keep the floor dry. Clean up any water that spills on the floor as soon as it happens. Remove soap buildup in the tub or shower regularly. Attach bath mats securely with double-sided non-slip rug tape. Do not have throw rugs and other things on the floor that can make you trip. What can I do in the bedroom? Use night lights. Make sure that you have a light by your bed that is easy to reach. Do not use any sheets or blankets that are too big for your bed. They should not hang down onto the floor. Have a firm chair that has side arms. You can use this for support while you get dressed. Do not have throw rugs and other things on the floor that can make you trip. What can I do in the kitchen? Clean up any spills right away. Avoid walking on wet floors. Keep items that  you use a lot in easy-to-reach places. If you need to reach something above you, use a strong step stool that has a grab bar. Keep electrical cords out of the way. Do not use floor polish or wax that makes floors slippery. If you must use wax, use non-skid floor wax. Do not have throw rugs and other things on the floor that can make you trip. What can I do with my stairs? Do not leave any items on the stairs. Make sure that there are handrails on both sides of the stairs and use them. Fix handrails that are broken or loose. Make sure that handrails are as long as the stairways. Check any carpeting to make sure that it is firmly attached to the  stairs. Fix any carpet that is loose or worn. Avoid having throw rugs at the top or bottom of the stairs. If you do have throw rugs, attach them to the floor with carpet tape. Make sure that you have a light switch at the top of the stairs and the bottom of the stairs. If you do not have them, ask someone to add them for you. What else can I do to help prevent falls? Wear shoes that: Do not have high heels. Have rubber bottoms. Are comfortable and fit you well. Are closed at the toe. Do not wear sandals. If you use a stepladder: Make sure that it is fully opened. Do not climb a closed stepladder. Make sure that both sides of the stepladder are locked into place. Ask someone to hold it for you, if possible. Clearly mark and make sure that you can see: Any grab bars or handrails. First and last steps. Where the edge of each step is. Use tools that help you move around (mobility aids) if they are needed. These include: Canes. Walkers. Scooters. Crutches. Turn on the lights when you go into a dark area. Replace any light bulbs as soon as they burn out. Set up your furniture so you have a clear path. Avoid moving your furniture around. If any of your floors are uneven, fix them. If there are any pets around you, be aware of where they are. Review your medicines with your doctor. Some medicines can make you feel dizzy. This can increase your chance of falling. Ask your doctor what other things that you can do to help prevent falls. This information is not intended to replace advice given to you by your health care provider. Make sure you discuss any questions you have with your health care provider. Document Released: 10/21/2008 Document Revised: 06/02/2015 Document Reviewed: 01/29/2014 Elsevier Interactive Patient Education  2017 [employer].

## 2020-07-15 NOTE — Progress Notes (Addendum)
Virtual Visit via Telephone Note  I connected with  Kathyleen D Dieter on 07/15/20 at  2:30 PM EDT by telephone and verified that I am speaking with the correct person using two identifiers.  Medicare Annual Wellness visit completed telephonically due to Covid-19 pandemic.   Persons participating in this call: This Health Coach and this patient.   Location: Patient: Home Provider: Office    I discussed the limitations, risks, security and privacy concerns of performing an evaluation and management service by telephone and the availability of in person appointments. The patient expressed understanding and agreed to proceed.  Unable to perform video visit due to video visit attempted and failed and/or patient does not have video capability.   Some vital signs may be absent or patient reported.   Willette Brace, LPN   Subjective:   NAYLANI BRADNER is a 65 y.o. female who presents for Medicare Annual (Subsequent) preventive examination.  Review of Systems     Cardiac Risk Factors include: advanced age (>46men, >9 women);diabetes mellitus;dyslipidemia;hypertension;obesity (BMI >30kg/m2)     Objective:    There were no vitals filed for this visit. There is no height or weight on file to calculate BMI.  Advanced Directives 07/15/2020 03/08/2020 08/28/2019 08/14/2019 12/24/2018 11/24/2018 01/22/2018  Does Patient Have a Medical Advance Directive? No No No No No No No  Does patient want to make changes to medical advance directive? - - - - - - -  Would patient like information on creating a medical advance directive? Yes (MAU/Ambulatory/Procedural Areas - Information given) Yes (MAU/Ambulatory/Procedural Areas - Information given) No - Patient declined No - Patient declined No - Patient declined No - Patient declined -  Pre-existing out of facility DNR order (yellow form or pink MOST form) - - - - - - -    Current Medications (verified) Outpatient Encounter Medications as of  07/15/2020  Medication Sig   albuterol (VENTOLIN HFA) 108 (90 Base) MCG/ACT inhaler INHALE 2 PUFFS INTO THE LUNGS EVERY 6 HOURS AS NEEDED FOR WHEEZING OR SHORTNESS OF BREATH   amLODipine (NORVASC) 10 MG tablet Take 1 tablet (10 mg total) by mouth daily.   ARIPiprazole (ABILIFY) 5 MG tablet Take 1 tablet (5 mg total) by mouth daily.   ARNUITY ELLIPTA 100 MCG/ACT AEPB INHALE 1 PUFF INTO THE LUNGS DAILY   busPIRone (BUSPAR) 5 MG tablet Take 5 mg by mouth 2 (two) times daily.   carvedilol (COREG) 25 MG tablet Take 1 tablet (25 mg total) by mouth 2 (two) times daily with a meal.   doxepin (SINEQUAN) 25 MG capsule Take 25 mg by mouth 3 (three) times daily as needed.   empagliflozin (JARDIANCE) 10 MG TABS tablet Take 1 tablet (10 mg total) by mouth daily.   FLUoxetine (PROZAC) 20 MG capsule Take 1 capsule (20 mg total) by mouth daily.   glucose blood test strip Test blood sugar daily and record on blood sugar log   Lancets (ACCU-CHEK SOFT TOUCH) lancets Check blood sugar daily and record in log   lisinopril-hydrochlorothiazide (ZESTORETIC) 20-25 MG tablet Take 1 tablet by mouth daily.   meclizine (ANTIVERT) 25 MG tablet Take 1 tablet (25 mg total) by mouth 3 (three) times daily as needed for dizziness.   metFORMIN (GLUCOPHAGE-XR) 500 MG 24 hr tablet TAKE 1 TABLET(500 MG) BY MOUTH DAILY WITH BREAKFAST   mycophenolate (CELLCEPT) 500 MG tablet Take 1 tablet by mouth 2 (two) times daily. Written by derm   Oxycodone HCl 10 MG TABS Take  by mouth daily as needed.   rosuvastatin (CRESTOR) 10 MG tablet Take 1 tablet (10 mg total) by mouth daily.   tiZANidine (ZANAFLEX) 4 MG tablet Take 4 mg by mouth 2 (two) times daily.   triamcinolone ointment (KENALOG) 0.1 % Apply 1 application topically 2 (two) times daily.   zolpidem (AMBIEN) 10 MG tablet Take 10 mg by mouth at bedtime.   EPINEPHrine 0.3 mg/0.3 mL IJ SOAJ injection Use as directed for severe allergic reaction (Patient not taking: No sig reported)    promethazine (PHENERGAN) 25 MG tablet take 1 tablet by mouth every 8 hours if needed for nausea and vomiting (Patient not taking: No sig reported)   [DISCONTINUED] sertraline (ZOLOFT) 100 MG tablet TAKE 1 TABLET(100 MG) BY MOUTH AT BEDTIME (Patient not taking: Reported on 07/15/2020)   No facility-administered encounter medications on file as of 07/15/2020.    Allergies (verified) Hydrocodone, Linalool, Methylisothiazolinone, Propylene glycol, Aspirin, Eggs or egg-derived products, and Hydrocodone-guaifenesin   History: Past Medical History:  Diagnosis Date   Angio-edema    Anxiety    Arthritis    "lower back" (01/29/2017)   Asthma    Bone spur    spine   Chronic lower back pain    Cirrhosis of liver ([place]) 12/2018   Depression    Diabetes mellitus without complication (HCC)    Eczema    GERD (gastroesophageal reflux disease)    Hepatitis C    was treated for this   History of atrial fibrillation without current medication    cardioversion in 1990s   Hyperplastic polyp of intestine    Hypertension    Insomnia    Osteoporosis    Splenic artery aneurysm (HCC)    Urticaria    Past Surgical History:  Procedure Laterality Date   CARDIOVERSION  1990s   CHOLECYSTECTOMY N/A 01/22/2018   Procedure: LAPAROSCOPIC CHOLECYSTECTOMY ERAS PATHWAY;  Surgeon: Clovis Riley, MD;  Location: [facility];  Service: General;  Laterality: N/A;   CYSTOSCOPY W/ URETERAL STENT PLACEMENT Left 01/29/2017   Procedure: CYSTOSCOPY WITH RETROGRADE PYELOGRAM/URETERAL STENT PLACEMENT;  Surgeon: Ardis Hughs, MD;  Location: [facility];  Service: Urology;  Laterality: Left;   CYSTOSCOPY WITH RETROGRADE PYELOGRAM, URETEROSCOPY AND STENT PLACEMENT Left 01/29/2017   CYSTOSCOPY WITH RETROGRADE PYELOGRAM/URETERAL 65 PLACEMENT   Family History  Problem Relation Age of Onset   Asthma Mother    Diabetes Mother    Asthma Father    Diabetes Father    Cancer Father        prostate   Eczema Father    Hepatitis B  Daughter    Breast cancer Neg Hx    Social History   Socioeconomic History   Marital status: Widowed    Spouse name: Not on file   Number of children: Not on file   Years of education: Not on file   Highest education level: Not on file  Occupational History   Not on file  Tobacco Use   Smoking status: Former    Packs/day: 0.25    Years: 37.00    Pack years: 9.25    Types: Cigarettes    Quit date: 03/30/2008    Years since quitting: 12.3   Smokeless tobacco: Never  Vaping Use   Vaping Use: Never used  Substance and Sexual Activity   Alcohol use: No   Drug use: Never   Sexual activity: Not Currently    Birth control/protection: Post-menopausal  Other Topics Concern   Not on file  Social History Narrative   Not on file   Social Determinants of Health   Financial Resource Strain: Low Risk    Difficulty of Paying Living Expenses: Not hard at all  Food Insecurity: No Food Insecurity   Worried About Charity fundraiser in the Last Year: Never true   Arboriculturist in the Last Year: Never true  Transportation Needs: No Transportation Needs   Lack of Transportation (Medical): No   Lack of Transportation (Non-Medical): No  Physical Activity: Inactive   Days of Exercise per Week: 0 days   Minutes of Exercise per Session: 0 min  Stress: Stress Concern Present   Feeling of Stress : To some extent  Social Connections: Moderately Isolated   Frequency of Communication with Friends and Family: More than three times a week   Frequency of Social Gatherings with Friends and Family: Twice a week   Attends Religious Services: More than 4 times per year   Active Member of [employer] or Organizations: No   Attends Archivist Meetings: Never   Marital Status: Widowed    Tobacco Counseling Counseling given: Not Answered   Clinical Intake:  Pre-visit preparation completed: Yes  Pain : No/denies pain (sore throat and dry cough)     BMI - recorded: 38.07 Nutritional  Status: BMI > 30  Obese Nutritional Risks: None Diabetes: Yes CBG done?: Yes (143) CBG resulted in Enter/ Edit results?: No Did pt. bring in CBG monitor from home?: No  How often do you need to have someone help you when you read instructions, pamphlets, or other written materials from your doctor or pharmacy?: 1 - Never  Diabetic?Nutrition Risk Assessment:  Has the patient had any N/V/D within the last 2 months?  No  Does the patient have any non-healing wounds?  No  Has the patient had any unintentional weight loss or weight gain?  No   Diabetes:  Is the patient diabetic?  Yes  If diabetic, was a CBG obtained today?  Yes  Did the patient bring in their glucometer from home?  No  How often do you monitor your CBG's? Daily   Financial Strains and Diabetes Management:  Are you having any financial strains with the device, your supplies or your medication? No .  Does the patient want to be seen by Chronic Care Management for management of their diabetes?  No  Would the patient like to be referred to a Nutritionist or for Diabetic Management?  No   Diabetic Exams:  Diabetic Eye Exam: Completed 04/12/20 Diabetic Foot Exam: Completed 01/04/20   Interpreter Needed?: No  Information entered by :: Charlott Rakes, LPN   Activities of Daily Living In your present state of health, do you have any difficulty performing the following activities: 07/15/2020  Hearing? N  Vision? N  Difficulty concentrating or making decisions? N  Walking or climbing stairs? N  Dressing or bathing? N  Doing errands, shopping? N  Preparing Food and eating ? N  Using the Toilet? N  In the past six months, have you accidently leaked urine? Y  Comment leakage after urination  Do you have problems with loss of bowel control? N  Managing your Medications? N  Managing your Finances? N  Housekeeping or managing your Housekeeping? N  Some recent data might be hidden    Patient Care Team: [name], MD as PCP - General (Family Medicine) Arta Silence, MD as Consulting Physician (Gastroenterology) Linward Headland, NP as Nurse  Practitioner (Psychology) Leona Singleton, RN as Case Manager Madelin Rear, [facility] as Pharmacist (Pharmacist)  Indicate any recent Medical Services you may have received from other than [facility] providers in the past year (date may be approximate).     Assessment:   This is a routine wellness examination for [place].  Hearing/Vision screen Hearing Screening - Comments:: Pt denies any hearing issues  Vision Screening - Comments:: [facility]   Dietary issues and exercise activities discussed: Current Exercise Habits: The patient does not participate in regular exercise at present   Goals Addressed             This Visit's Progress    Patient Stated       Lose weight         Depression Screen PHQ 2/9 Scores 07/15/2020 03/08/2020 01/04/2020 08/28/2019 08/14/2019 01/21/2019 11/24/2018  PHQ - 2 Score 1 4 6 2 4 4 4   PHQ- 9 Score - 8 13 7 9 10 11     Fall Risk Fall Risk  07/15/2020 05/24/2020 03/08/2020 08/28/2019 12/24/2018  Falls in the past year? 0 0 0 0 0  Number falls in past yr: 0 0 0 - -  Injury with Fall? 0 0 0 - -  Risk for fall due to : Impaired vision Medication side effect;Impaired mobility Medication side effect - -  Follow up Falls prevention discussed Education provided;Falls evaluation completed;Falls prevention discussed Falls evaluation completed;Education provided;Falls prevention discussed - -    FALL RISK PREVENTION PERTAINING TO THE HOME:  Any stairs in or around the home? Yes  If so, are there any without handrails? No  Home free of loose throw rugs in walkways, pet beds, electrical cords, etc? Yes  Adequate lighting in your home to reduce risk of falls? Yes   ASSISTIVE DEVICES UTILIZED TO PREVENT FALLS:  Life alert? No  Use of a cane, walker or w/c? No  Grab bars in the bathroom? Yes   Shower chair or bench in shower? No  Elevated toilet seat or a handicapped toilet? No   TIMED UP AND GO:  Was the test performed? No      Cognitive Function:     6CIT Screen 07/15/2020  What Year? 0 points  What month? 0 points  What time? 0 points  Count back from 20 0 points  Months in reverse 0 points  Repeat phrase 0 points  Total Score 0    Immunizations Immunization History  Administered Date(s) Administered   Hepatitis B, adult 10/12/2014, 11/09/2014, 04/11/2015   Influenza Split 11/14/2010, 10/09/2011   Influenza Whole 10/13/2007, 01/10/2009, 11/22/2009   Influenza,inj,Quad PF,6+ Mos 10/10/2012, 10/08/2013, 10/15/2014, 09/29/2015, 11/06/2016, 09/10/2017, 10/15/2018, 09/30/2019   PFIZER(Purple Top)SARS-COV-2 Vaccination 03/20/2019, 04/15/2019, 10/15/2019   Pneumococcal Polysaccharide-23 01/10/2009   Td 01/09/1999, 01/10/2009    TDAP status: Due, Education has been provided regarding the importance of this vaccine. Advised may receive this vaccine at local pharmacy or Health Dept. Aware to provide a copy of the vaccination record if obtained from local pharmacy or Health Dept. Verbalized acceptance and understanding.  Flu Vaccine status: Up to date  Pneumococcal vaccine status: Due, Education has been provided regarding the importance of this vaccine. Advised may receive this vaccine at local pharmacy or Health Dept. Aware to provide a copy of the vaccination record if obtained from local pharmacy or Health Dept. Verbalized acceptance and understanding.  Covid-19 vaccine status: Completed vaccines  Qualifies for Shingles Vaccine? Yes  Zostavax completed No   Shingrix Completed?: No.    Education has been provided regarding the importance of this vaccine. Patient has been advised to call insurance company to determine out of pocket expense if they have not yet received this vaccine. Advised may also receive vaccine at local pharmacy or Health Dept. Verbalized  acceptance and understanding.  Screening Tests Health Maintenance  Topic Date Due   Zoster Vaccines- Shingrix (1 of 2) Never done   Pneumococcal Vaccine 77-10 Years old (2 - PCV) 01/10/2010   TETANUS/TDAP  01/11/2019   COVID-19 Vaccine (4 - Booster for Pfizer series) 01/15/2020   INFLUENZA VACCINE  08/08/2020   HEMOGLOBIN A1C  11/05/2020   FOOT EXAM  01/03/2021   OPHTHALMOLOGY EXAM  04/12/2021   MAMMOGRAM  04/29/2021   PAP SMEAR-Modifier  05/07/2023   COLONOSCOPY (Pts 45-90yrs Insurance coverage will need to be confirmed)  12/14/2029   Hepatitis C Screening  Completed   HIV Screening  Completed   HPV VACCINES  Aged Out    Health Maintenance  Health Maintenance Due  Topic Date Due   Zoster Vaccines- Shingrix (1 of 2) Never done   Pneumococcal Vaccine 61-80 Years old (2 - PCV) 01/10/2010   TETANUS/TDAP  01/11/2019   COVID-19 Vaccine (4 - Booster for Pfizer series) 01/15/2020    Colorectal cancer screening: Type of screening: Colonoscopy. Completed 12/15/19. Repeat every 10 years  Mammogram status: Completed 04/29/20. Repeat every year  Bone Density status: Completed 04/22/19. Results reflect: Bone density results: OSTEOPOROSIS. Repeat every 2 years.  Additional Screening:  Hepatitis C Screening:  Completed 01/04/20  Vision Screening: Recommended annual ophthalmology exams for early detection of glaucoma and other disorders of the eye. Is the patient up to date with their annual eye exam?  Yes  Who is the provider or what is the name of the office in which the patient attends annual eye exams? [facility] If pt is not established with a provider, would they like to be referred to a provider to establish care? No .   Dental Screening: Recommended annual dental exams for proper oral hygiene  Community Resource Referral / Chronic Care Management: CRR required this visit?  No   CCM required this visit?  No      Plan:     I have personally reviewed and noted the  following in the patient's chart:   Medical and social history Use of alcohol, tobacco or illicit drugs  Current medications and supplements including opioid prescriptions.  Functional ability and status Nutritional status Physical activity Advanced directives List of other physicians Hospitalizations, surgeries, and ER visits in previous 12 months Vitals Screenings to include cognitive, depression, and falls Referrals and appointments  In addition, I have reviewed and discussed with patient certain preventive protocols, quality metrics, and best practice recommendations. A written personalized care plan for preventive services as well as general preventive health recommendations were provided to patient.     Willette Brace, LPN   07/13/1023   Nurse Notes: None

## 2020-07-19 ENCOUNTER — Other Ambulatory Visit: Payer: Self-pay

## 2020-07-19 ENCOUNTER — Encounter: Payer: Self-pay | Admitting: Family Medicine

## 2020-07-19 ENCOUNTER — Telehealth (INDEPENDENT_AMBULATORY_CARE_PROVIDER_SITE_OTHER): Payer: Medicare HMO | Admitting: Family Medicine

## 2020-07-19 DIAGNOSIS — B9689 Other specified bacterial agents as the cause of diseases classified elsewhere: Secondary | ICD-10-CM

## 2020-07-19 DIAGNOSIS — J208 Acute bronchitis due to other specified organisms: Secondary | ICD-10-CM | POA: Diagnosis not present

## 2020-07-19 DIAGNOSIS — E119 Type 2 diabetes mellitus without complications: Secondary | ICD-10-CM | POA: Diagnosis not present

## 2020-07-19 MED ORDER — AZITHROMYCIN 250 MG PO TABS: ORAL_TABLET | ORAL | 0 refills | Status: DC

## 2020-07-19 MED ORDER — BENZONATATE 100 MG PO CAPS: 100.0000 mg | ORAL_CAPSULE | Freq: Two times a day (BID) | ORAL | 0 refills | Status: DC | PRN

## 2020-07-19 NOTE — Progress Notes (Signed)
Virtual Visit via Video Note  Subjective  CC:  Chief Complaint  Patient presents with   Cough    Started 7/8 Negative COVID tests   Sore Throat     I connected with Stacy Campbell on 07/19/20 at 10:30 AM EDT by a video enabled telemedicine application and verified that I am speaking with the correct person using two identifiers. Location patient: Home Location provider: [facility], Office Persons participating in the virtual visit: Deerica D Imel, [name], MD Reymundo Poll CMA  I discussed the limitations of evaluation and management by telemedicine and the availability of in person appointments. The patient expressed understanding and agreed to proceed. HPI: Stacy Campbell is a 65 y.o. female who was contacted today to address the problems listed above in the chief complaint. 65 yo with multiple comorbidities including diabetes and cirrhosis c/o 8 day URI illness with ST, dry hacking cough, hoarseness and mild malaise. Her grandson had a similar illness and visited her with he. He has fully recovered. Her sxs started on July 4th. She tested negative with a rapid home test for covid on the 6th. However, sxs persist.  Sugars are fine No sob, fevers, chest pain or gi sxs.  Assessment  1. Acute bacterial bronchitis   2. Controlled type 2 diabetes mellitus without complication, without long-term current use of insulin (HCC)      Plan  Possible bronchits or covid:  due to persistent sxs, high risk pt, treat with zpak and tessalon perles. Supportive care.  Monitor sugars.  I discussed the assessment and treatment plan with the patient. The patient was provided an opportunity to ask questions and all were answered. The patient agreed with the plan and demonstrated an understanding of the instructions.   The patient was advised to call back or seek an in-person evaluation if the symptoms worsen or if the condition fails to  improve as anticipated. Follow up: as scheduled.   08/05/2020  Meds ordered this encounter  Medications   azithromycin (ZITHROMAX) 250 MG tablet    Sig: Take 2 tabs today, then 1 tab daily for 4 days    Dispense:  1 each    Refill:  0   benzonatate (TESSALON) 100 MG capsule    Sig: Take 1 capsule (100 mg total) by mouth 2 (two) times daily as needed for cough.    Dispense:  20 capsule    Refill:  0      I reviewed the patients updated PMH, FH, and SocHx.    Patient Active Problem List   Diagnosis Date Noted   Cirrhosis of liver ([place]) - h/o hep C 01/04/2020    Priority: High   Monoclonal gammopathy of unknown significance ([facility]) 01/23/2019    Priority: High   Osteoporosis with current pathological fracture 01/21/2019    Priority: High   Controlled type 2 diabetes mellitus without complication, without long-term current use of insulin ([place]) 12/12/2018    Priority: High   Hyperlipidemia associated with type 2 diabetes mellitus ([place]) 12/12/2018    Priority: High   Asthma with COPD ([place]) 05/25/2013    Priority: High   Obesity, Class II, BMI 35-39.9 03/07/2006    Priority: High   Major depression, recurrent, chronic ([place]) 03/07/2006    Priority: High   Essential hypertension 03/07/2006    Priority: High   Chronic low back pain 03/07/2006    Priority: High   Insomnia 03/07/2006  Priority: High   Degenerative joint disease (DJD) of lumbar spine 01/04/2020    Priority: Medium   History of hepatitis C 10/11/2017    Priority: Medium   History of atrial fibrillation 10/11/2017    Priority: Medium   Former smoker 10/11/2017    Priority: Medium   GERD (gastroesophageal reflux disease) 02/15/2017    Priority: Medium   Psoriasis 07/10/2015    Priority: Medium   Seasonal and perennial allergic rhinoconjunctivitis 02/12/2018    Priority: Low   Dyshidrotic eczema 09/10/2017    Priority: Low   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   ARIPiprazole (ABILIFY) 5 MG tablet Take 1 tablet (5 mg total) by mouth daily.   ARNUITY ELLIPTA 100 MCG/ACT AEPB INHALE 1 PUFF INTO THE LUNGS DAILY   azithromycin (ZITHROMAX) 250 MG tablet Take 2 tabs today, then 1 tab daily for 4 days   benzonatate (TESSALON) 100 MG capsule Take 1 capsule (100 mg total) by mouth 2 (two) times daily as needed for cough.   busPIRone (BUSPAR) 5 MG tablet Take 5 mg by mouth 2 (two) times daily.   carvedilol (COREG) 25 MG tablet Take 1 tablet (25 mg total) by mouth 2 (two) times daily with a meal.   doxepin (SINEQUAN) 25 MG capsule Take 25 mg by mouth 3 (three) times daily as needed.   empagliflozin (JARDIANCE) 10 MG TABS tablet Take 1 tablet (10 mg total) by mouth daily.   EPINEPHrine 0.3 mg/0.3 mL IJ SOAJ injection Use as directed for severe allergic reaction   FLUoxetine (PROZAC) 20 MG capsule Take 1 capsule (20 mg total) by mouth daily.   glucose blood test strip Test blood sugar daily and record on blood sugar log   Lancets (ACCU-CHEK SOFT TOUCH) lancets Check blood sugar daily and record in log   lisinopril-hydrochlorothiazide (ZESTORETIC) 20-25 MG tablet Take 1 tablet by mouth daily.   meclizine (ANTIVERT) 25 MG tablet Take 1 tablet (25 mg total) by mouth 3 (three) times daily as needed for dizziness.   metFORMIN (GLUCOPHAGE-XR) 500 MG 24 hr tablet TAKE 1 TABLET(500 MG) BY MOUTH DAILY WITH BREAKFAST   mycophenolate (CELLCEPT) 500 MG tablet Take 1 tablet by mouth 2 (two) times daily. Written by derm   Oxycodone HCl 10 MG TABS Take by mouth daily as needed.   promethazine (PHENERGAN) 25 MG tablet take 1 tablet by mouth every 8 hours if needed for nausea and vomiting   rosuvastatin (CRESTOR) 10 MG tablet  Take 1 tablet (10 mg total) by mouth daily.   tiZANidine (ZANAFLEX) 4 MG tablet Take 4 mg by mouth 2 (two) times daily.   triamcinolone ointment (KENALOG) 0.1 % Apply 1 application topically 2 (two) times daily.   zolpidem (AMBIEN) 10 MG tablet Take 10 mg by mouth at bedtime.    Allergies: Patient is allergic to hydrocodone, linalool, methylisothiazolinone, propylene glycol, aspirin, eggs or egg-derived products, and hydrocodone-guaifenesin. Family History: Patient family history includes Asthma in her father and mother; Cancer in her father; Diabetes in her father and mother; Eczema in her father; Hepatitis B in her daughter. Social History:  Patient  reports that she quit smoking about  12 years ago. Her smoking use included cigarettes. She has a 9.25 pack-year smoking history. She has never used smokeless tobacco. She reports that she does not drink alcohol and does not use drugs.  Review of Systems: Constitutional: Negative for fever malaise or anorexia Cardiovascular: negative for chest pain Respiratory: negative for SOB or persistent cough Gastrointestinal: negative for abdominal pain  OBJECTIVE Vitals: There were no vitals taken for this visit. General: no acute distress , A&Ox3, hoarse but no respiratory distress  [name], MD

## 2020-08-05 ENCOUNTER — Ambulatory Visit: Payer: Medicare HMO | Admitting: Family Medicine

## 2020-08-11 ENCOUNTER — Ambulatory Visit (INDEPENDENT_AMBULATORY_CARE_PROVIDER_SITE_OTHER): Payer: Medicare HMO | Admitting: *Deleted

## 2020-08-11 DIAGNOSIS — E119 Type 2 diabetes mellitus without complications: Secondary | ICD-10-CM

## 2020-08-11 DIAGNOSIS — I10 Essential (primary) hypertension: Secondary | ICD-10-CM

## 2020-08-11 NOTE — Patient Instructions (Signed)
Visit Information  PATIENT GOALS:  Goals Addressed             This Visit's Progress    (RNCM) Find Help in My Community   On track    Timeframe:  Short-Term Goal Priority:  High Start Date:   03/08/20                          Expected End Date:   10/07/20                    Follow Up Date 09/15/20    Call 211 when I need some help Follow-up on any referrals for help I am given Make a list of family or friends that I can call  Review list of agencies to assist with food given by the [place] to ask about $75 food allowance with DSNP benefit   Why is this important?   Knowing how and where to find help for yourself or family in your neighborhood and community is an important skill.  You will want to take some steps to learn how.    Notes:      (RNCM) Monitor and Manage My Blood Sugar-Diabetes Type 2   On track    Timeframe:  Long-Range Goal Priority:  Medium Start Date:   03/08/20                          Expected End Date:   01/06/21                    Follow Up Date 09/15/20    Continue to check blood sugars twice a day Enter blood sugar readings into daily log Take the blood sugar log to all doctor visits  Request [employer] to fax eye exam results to Dr. Jonni Sanger office Increase activity slowly as tolerated   Why is this important?   Checking your blood sugar at home helps to keep it from getting very high or very low.  Writing the results in a diary or log helps the doctor know how to care for you.  Your blood sugar log should have the time, date and the results.  Also, write down the amount of insulin or other medicine that you take.  Other information, like what you ate, exercise done and how you were feeling, will also be helpful.     Notes:      (RNCM) Track and Manage My Blood Pressure-Hypertension   On track    Timeframe:  Long-Range Goal Priority:  Medium Start Date:  03/08/20                           Expected End Date:   01/06/21                     Follow Up Date 09/15/20    Check blood pressure daily  Write blood pressure results in a log  Monitor yourself for hypotension and hypertension Follow low salt carbohydrate modified diabetic diet   Why is this important?   You won't feel high blood pressure, but it can still hurt your blood vessels.  High blood pressure can cause heart or kidney problems. It can also cause a stroke.  Making lifestyle changes like losing a little weight or eating less salt will help.  Checking your blood  pressure at home and at different times of the day can help to control blood pressure.  If the doctor prescribes medicine remember to take it the way the doctor ordered.  Call the office if you cannot afford the medicine or if there are questions about it.     Notes:         The patient verbalized understanding of instructions, educational materials, and care plan provided today and declined offer to receive copy of patient instructions, educational materials, and care plan.   The care management team will reach out to the patient again over the next 45 business days.   Hubert Azure RN, MSN RN Care Management Coordinator  [facility] [phone] [email or web address]

## 2020-08-11 NOTE — Chronic Care Management (AMB) (Signed)
Chronic Care Management   CCM RN Visit Note  08/11/2020 Name: Stacy Campbell MRN: [id number] DOB: March 15, 1955  Subjective: Stacy Campbell is a 65 y.o. year old female who is a primary care patient of [name], MD. The care management team was consulted for assistance with disease management and care coordination needs.    Engaged with patient by telephone for follow up visit in response to provider referral for case management and/or care coordination services.   Consent to Services:  The patient was given information about Chronic Care Management services, agreed to services, and gave verbal consent prior to initiation of services.  Please see initial visit note for detailed documentation.   Patient agreed to services and verbal consent obtained.   Assessment: Review of patient past medical history, allergies, medications, health status, including review of consultants reports, laboratory and other test data, was performed as part of comprehensive evaluation and provision of chronic care management services.   SDOH (Social Determinants of Health) assessments and interventions performed:    CCM Care Plan  Allergies  Allergen Reactions   Hydrocodone Itching   Linalool    Methylisothiazolinone Other (See Comments)    Other reaction(s): Other (See Comments) Positive patch test Positive patch test    Propylene Glycol    Aspirin Itching and Other (See Comments)    Lower dose doesn't make her itch (she takes it every day)   Eggs Or Egg-Derived Products Nausea Only and Other (See Comments)    No reaction if in another food   Hydrocodone-Guaifenesin Rash    Outpatient Encounter Medications as of 08/11/2020  Medication Sig Note   doxepin (SINEQUAN) 25 MG capsule Take 25 mg by mouth 3 (three) times daily as needed.    empagliflozin (JARDIANCE) 10 MG TABS tablet Take 1 tablet (10 mg total) by mouth daily.    fluconazole (DIFLUCAN) 200 MG tablet Take 1 pill once a week  for 12 weeks.    metFORMIN (GLUCOPHAGE-XR) 500 MG 24 hr tablet TAKE 1 TABLET(500 MG) BY MOUTH DAILY WITH BREAKFAST    mycophenolate (CELLCEPT) 500 MG tablet Take 1 tablet by mouth 2 (two) times daily. Written by derm    nystatin (MYCOSTATIN/NYSTOP) powder Apply topically.    triamcinolone ointment (KENALOG) 0.1 % Apply 1 application topically 2 (two) times daily.    albuterol (VENTOLIN HFA) 108 (90 Base) MCG/ACT inhaler INHALE 2 PUFFS INTO THE LUNGS EVERY 6 HOURS AS NEEDED FOR WHEEZING OR SHORTNESS OF BREATH    amLODipine (NORVASC) 10 MG tablet Take 1 tablet (10 mg total) by mouth daily.    ARIPiprazole (ABILIFY) 5 MG tablet Take 1 tablet (5 mg total) by mouth daily.    ARNUITY ELLIPTA 100 MCG/ACT AEPB INHALE 1 PUFF INTO THE LUNGS DAILY    azithromycin (ZITHROMAX) 250 MG tablet Take 2 tabs today, then 1 tab daily for 4 days (Patient not taking: Reported on 08/11/2020) 08/11/2020: Completed    benzonatate (TESSALON) 100 MG capsule Take 1 capsule (100 mg total) by mouth 2 (two) times daily as needed for cough.    busPIRone (BUSPAR) 5 MG tablet Take 5 mg by mouth 2 (two) times daily.    carvedilol (COREG) 25 MG tablet Take 1 tablet (25 mg total) by mouth 2 (two) times daily with a meal.    EPINEPHrine 0.3 mg/0.3 mL IJ SOAJ injection Use as directed for severe allergic reaction    FLUoxetine (PROZAC) 20 MG capsule Take 1 capsule (20 mg total) by mouth daily.  glucose blood test strip Test blood sugar daily and record on blood sugar log    Lancets (ACCU-CHEK SOFT TOUCH) lancets Check blood sugar daily and record in log    lisinopril-hydrochlorothiazide (ZESTORETIC) 20-25 MG tablet Take 1 tablet by mouth daily. 03/22/2020: Resumed on 03/14/2020 per patient   meclizine (ANTIVERT) 25 MG tablet Take 1 tablet (25 mg total) by mouth 3 (three) times daily as needed for dizziness.    Oxycodone HCl 10 MG TABS Take by mouth daily as needed.    promethazine (PHENERGAN) 25 MG tablet take 1 tablet by mouth every 8  hours if needed for nausea and vomiting    rosuvastatin (CRESTOR) 10 MG tablet Take 1 tablet (10 mg total) by mouth daily.    tiZANidine (ZANAFLEX) 4 MG tablet Take 4 mg by mouth 2 (two) times daily.    zolpidem (AMBIEN) 10 MG tablet Take 10 mg by mouth at bedtime.    No facility-administered encounter medications on file as of 08/11/2020.    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   Cirrhosis of liver ([place]) - h/o hep C 01/04/2020   Degenerative joint disease (DJD) of lumbar spine 01/04/2020   Monoclonal gammopathy of unknown significance (MGUS) 01/23/2019   Osteoporosis with current pathological fracture 01/21/2019   Controlled type 2 diabetes mellitus without complication, without long-term current use of insulin ([place]) 12/12/2018   Hyperlipidemia associated with type 2 diabetes mellitus ([place]) 12/12/2018   Seasonal and perennial allergic rhinoconjunctivitis 02/12/2018   History of hepatitis C 10/11/2017   History of atrial fibrillation 10/11/2017   Former smoker 10/11/2017   Dyshidrotic eczema 09/10/2017   GERD (gastroesophageal reflux disease) 02/15/2017   Psoriasis 07/10/2015   Asthma with COPD ([place]) 05/25/2013   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   Obesity, Class II, BMI 35-39.9 03/07/2006   Major depression, recurrent, chronic ([place]) 03/07/2006   Essential hypertension 03/07/2006   Chronic low back pain 03/07/2006   Insomnia 03/07/2006    Conditions to be addressed/monitored:HTN and DMII  Care Plan : Diabetes Type 2 (Adult)  Updates made by Leona Singleton, RN since 08/11/2020 12:00 AM     Problem: Patient will maintain Hgb A1C of below 7 within the next 90 days.   Priority: Medium     Long-Range Goal: Patient will maintain Hgb A1C of below 7 within the next 90 days   Start Date: 03/08/2020  Expected End Date: 01/06/2021  This Visit's Progress: On track  Recent Progress: On track  Priority: Medium  Note:   Objective:  Lab  Results  Component Value Date   HGBA1C 6.9 (A) 05/06/2020   Lab Results  Component Value Date   CREATININE 1.29 (H) 05/06/2020   CREATININE 1.53 (H) 02/15/2020   CREATININE 0.80 01/04/2020  Current Barriers:  Knowledge Deficits related to basic Diabetes pathophysiology and self care/management.  Patient's current Hgb A1C level slightly increased to 6.9.   Continues to monitor blood sugars twice a day.  Fasting blood sugar this morning was 138 with recent fasting ranges of 120-180's.  Denies any hypoglycemic episodes.  Reports compliance with all medications.  States she did attend her eye exam on 04/12/20 with Dr. Ronnald Ramp at [facility].  Concerned about weight gain, discussed increasing activity as back pain allows.  Does report recent bronchitis episode she has healed from.  Patient also stating she has bad psoriasis flare being treated by dermatologist.  Reports increased stress related to daughters recent hospitalization and  now in rehab facility. Difficulties managing chronic conditions related to pain and anxiety.  Attends pain management clinic to manage chronic back pain. Reports pain limits activity and prevents her from spending time with her family.  Patient states she continues to see therapist and psychiatrist monthly for her anxiety and depression.   Case Manager Clinical Goal(s):  Patient will demonstrate improved adherence to prescribed treatment plan for diabetes self care/management as evidenced by: daily monitoring and recording of CBG,  adherence to ADA/ carb modified diet, adherence to prescribed medication regimen, contacting provider for new or worsened symptoms or questions Interventions:  Collaboration with [name], MD regarding development and update of comprehensive plan of care as evidenced by provider attestation and co-signature Inter-disciplinary care team collaboration (see longitudinal plan of care) Reviewed medications with patient and discussed importance of  medication adherence Discussed plans with patient for ongoing care management follow up and provided patient with direct contact information for care management team Advised patient, providing education and rationale, to check cbg twice a day and record, calling primary care provider for findings outside established parameters.   Reviewed current glucose readings and discussed ways to help reduce Encouraged to notify provider for sustained elevated blood sugars Discussed weight gain/loss and encouraged to increase activity as back pain allows Encouraged to discuss pain limiting effects with pain management provider Discussed and encouraged heart healthy carbohydrate modified diet/food options; sent education on diabetic diet/food choices Encouraged patient to request eye exam be faxed to primary care provider Encouraged to attend scheduled medical appointments, PCP 08/17/20 Sent [facility] Exercise Program Activity Booklet and encouraged patient to use No significant recommendations/changes Patient Goals/Self-Care Activities: Continue to check blood sugars twice a day Enter blood sugar readings into daily log Take the blood sugar log to all doctor visits  Request [employer] to fax eye exam results to Dr. Jonni Sanger office Increase activity slowly as tolerated Follow Up Plan: The care management team will reach out to the patient again over the next 45 business days.       Care Plan : Hypertension (Adult)  Updates made by Leona Singleton, RN since 08/11/2020 12:00 AM     Problem: Hypertension (Hypertension)   Priority: Medium     Long-Range Goal: Hypertension Monitored   Start Date: 03/08/2020  Expected End Date: 09/06/2020  This Visit's Progress: On track  Recent Progress: On track  Priority: Medium  Note:   Current Barriers:  Knowledge Deficits related to basic understanding of hypertension pathophysiology and self care management.  Patient reporting latest BP reading 119/73,  with recent  ranges of 120-130/70-80's. Denies any light headiness or dizziness at this time.  Denies shortness of breath or swelling.    Difficulties maintaining food in the home.  Continues to report having trouble affording food and often times about to run out of food before having money to buy more.  Has spoken with care guide and has received list of food banks.  Does not like the food with Me[employer] on Wheels.  Discussed DSNP benefit with [employer] of healthy foods $75 a month and encouraged patient to contact her insurance company. Nurse Case Manager Clinical Goal(s):  Over the next 90 days, patient will verbalize understanding of plan for hypertension management Over the next 90 days, patient will demonstrate improved adherence to prescribed treatment plan for hypertension as evidenced by taking all medications as prescribed, monitoring and recording blood pressure as directed, adhering to low sodium/DASH diet Interventions:  Collaboration with Billey Chang  L, MD regarding development and update of comprehensive plan of care as evidenced by provider attestation and co-signature Inter-disciplinary care team collaboration (see longitudinal plan of care) Evaluation of current treatment plan related to hypertension self management and patient's adherence to plan as established by provider. Reviewed medications with patient and discussed importance of compliance Encouraged patient to contact CCM Pharmacist or RNCM for any questions or concerns Discussed plans with patient for ongoing care management follow up and provided patient with direct contact information for care management team Advised patient, providing education and rationale, to monitor blood pressure daily and record, calling PCP for findings outside established parameters.  Reviewed signs and symptoms of hypotension and hypertension; encouraged patient to review previous education sent Verified patient has list of food banks and encouraged patient to  review/contact list of food banks provided by [facility] Guides Encouraged her to continue to use Calendar Booklet to log blood pressure readings for provider review Encouraged low salt carbohydrate modified diabetic diet, sent education on DASH diet Encouraged to continue with therapy and discussed not stopping depression medication Discussed increasing activity as tolerated Discussed healthy food $75 a month allowance from [place] benefit and encouraged patient to contact [employer] to request benefit or verify benefit No significant recommendations/changes Patient Goals/Self-Care Activities:  Over the next 45 days, patient will: Call 211 when I need some help Follow-up on any referrals for help I am given Make a list of family or friends that I can call  Review list of agencies to assist with food given by the [place] to ask about $75 food allowance with DSNP benefit Check blood pressure daily  Write blood pressure results in a log  Monitor yourself for hypotension and hypertension Follow low salt carbohydrate modified diabetic diet Follow Up Plan: The care management team will reach out to the patient again over the next 45 business days.         Plan:The care management team will reach out to the patient again over the next 45 business days.  Hubert Azure RN, MSN RN Care Management Coordinator  [place] [phone] [email or web address]

## 2020-08-17 ENCOUNTER — Ambulatory Visit (INDEPENDENT_AMBULATORY_CARE_PROVIDER_SITE_OTHER): Payer: Medicare HMO | Admitting: Family Medicine

## 2020-08-17 ENCOUNTER — Other Ambulatory Visit: Payer: Self-pay

## 2020-08-17 ENCOUNTER — Encounter: Payer: Self-pay | Admitting: Family Medicine

## 2020-08-17 VITALS — BP 116/60 | HR 92 | Temp 98.1°F | Wt 233.0 lb

## 2020-08-17 DIAGNOSIS — E66812 Obesity, class 2: Secondary | ICD-10-CM

## 2020-08-17 DIAGNOSIS — E119 Type 2 diabetes mellitus without complications: Secondary | ICD-10-CM

## 2020-08-17 DIAGNOSIS — I10 Essential (primary) hypertension: Secondary | ICD-10-CM

## 2020-08-17 DIAGNOSIS — H8110 Benign paroxysmal vertigo, unspecified ear: Secondary | ICD-10-CM

## 2020-08-17 LAB — POCT GLYCOSYLATED HEMOGLOBIN (HGB A1C): Hemoglobin A1C: 8.3 % — AB (ref 4.0–5.6)

## 2020-08-17 MED ORDER — TRULICITY 0.75 MG/0.5ML ~~LOC~~ SOAJ: 0.7500 mg | SUBCUTANEOUS | 5 refills | Status: DC

## 2020-08-17 MED ORDER — MECLIZINE HCL 25 MG PO TABS: 25.0000 mg | ORAL_TABLET | Freq: Three times a day (TID) | ORAL | 0 refills | Status: DC | PRN

## 2020-08-17 NOTE — Progress Notes (Signed)
Subjective  CC:  Chief Complaint  Patient presents with   Diabetes   Dizziness    Started Sunday, needing a refill of meclizine     HPI: Stacy Campbell is a 65 y.o. female who presents to the office today for follow up of diabetes and problems listed above in the chief complaint.  Diabetes follow up: Her diabetic control is reported as Worse.  Her diet is variable.  She has been spending a lot of time with her daughter who remains in the hospital.  She is had a prolonged stay.  Patient reports she does not overeat but weight continues to increase.  She is on Abilify.  This is a long-term medication.  She takes metformin 1000 twice daily and Jardiance 10 mg daily.She denies exertional CP or SOB or symptomatic hypoglycemia. She denies foot sores or paresthesias.  Depression remains active.  She and her psychiatrist are adjusting medications. Review of systems negative for abdominal pain, abdominal mass or bloating. Blood pressure: She continues to take her antihypertensives without adverse effects.  See below for medications and dosages. Complains of vertigo.  She has a history of intermittent positional vertigo.  Started yesterday.  No new or worrisome symptoms.  No headaches or diplopia.  No neurologic symptoms.  Has used meclizine in the past.  No adverse effects.  Wt Readings from Last 3 Encounters:  08/17/20 233 lb (105.7 kg)  05/06/20 228 lb 12.8 oz (103.8 kg)  02/15/20 218 lb 12.8 oz (99.2 kg)    BP Readings from Last 3 Encounters:  08/17/20 116/60  05/06/20 136/78  02/15/20 (!) 150/82    Assessment  1. Controlled type 2 diabetes mellitus without complication, without long-term current use of insulin ([place])   2. Essential hypertension   3. Class 2 severe obesity due to excess calories with serious comorbidity in adult, unspecified BMI ([place])   4. Benign paroxysmal positional vertigo, unspecified laterality      Plan  Diabetes is currently marginally controlled.   Discussed weight gain and diet.  Continue Jardiance 10 mg daily, metformin 1000 twice daily and will add Trulicity [id number] mg subcu weekly.  Educated use, benefits and possible side effects.  Can increase Jardiance to 25 mg daily if needed as well.  We will reassess in 3 months Depression: Barrier to good control currently.  Working with psychiatry.  On multiple medications. Vertigo: No red flag symptoms.  Meclizine as needed. Hypertension: Remains well controlled.Continue carvedilol 25 mg twice daily and amlodipine 10 mg daily.  Follow up: 3 months for diabetic recheck. Orders Placed This Encounter  Procedures   POCT HgB A1C   Meds ordered this encounter  Medications   meclizine (ANTIVERT) 25 MG tablet    Sig: Take 1 tablet (25 mg total) by mouth 3 (three) times daily as needed for dizziness.    Dispense:  30 tablet    Refill:  0   Dulaglutide (TRULICITY) A999333 0000000 SOPN    Sig: Inject 0.75 mg into the skin once a week.    Dispense:  2 mL    Refill:  5      Immunization History  Administered Date(s) Administered   Hepatitis B, adult 10/12/2014, 11/09/2014, 04/11/2015   Influenza Split 11/14/2010, 10/09/2011   Influenza Whole 10/13/2007, 01/10/2009, 11/22/2009   Influenza,inj,Quad PF,6+ Mos 10/10/2012, 10/08/2013, 10/15/2014, 09/29/2015, 11/06/2016, 09/10/2017, 10/15/2018, 09/30/2019   PFIZER(Purple Top)SARS-COV-2 Vaccination 03/20/2019, 04/15/2019, 10/15/2019   Pneumococcal Polysaccharide-23 01/10/2009   Td 01/09/1999, 01/10/2009    Diabetes  Related Lab Review: Lab Results  Component Value Date   HGBA1C 8.3 (A) 08/17/2020   HGBA1C 6.9 (A) 05/06/2020   HGBA1C 6.7 (A) 01/04/2020    Lab Results  Component Value Date   MICROALBUR <0.7 01/04/2020   Lab Results  Component Value Date   CREATININE 1.29 (H) 05/06/2020   BUN 30 (H) 05/06/2020   NA 137 05/06/2020   K 5.0 05/06/2020   CL 101 05/06/2020   CO2 26 05/06/2020   Lab Results  Component Value Date   CHOL 140  02/15/2020   CHOL 174 01/04/2020   CHOL 178 12/11/2018   Lab Results  Component Value Date   HDL 37.50 (L) 02/15/2020   HDL 38.10 (L) 01/04/2020   HDL 32 (L) 12/11/2018   Lab Results  Component Value Date   LDLCALC 73 02/15/2020   LDLCALC 104 (H) 01/04/2020   LDLCALC 111 (H) 12/11/2018   Lab Results  Component Value Date   TRIG 148.0 02/15/2020   TRIG 159.0 (H) 01/04/2020   TRIG 196 (H) 12/11/2018   Lab Results  Component Value Date   CHOLHDL 4 02/15/2020   CHOLHDL 5 01/04/2020   CHOLHDL 5.6 (H) 12/11/2018   No results found for: LDLDIRECT The 10-year ASCVD risk score Mikey Bussing DC Jr., et al., 2013) is: 13.2%   Values used to calculate the score:     Age: 32 years     Sex: Female     Is Non-Hispanic African American: Yes     Diabetic: Yes     Tobacco smoker: No     Systolic Blood Pressure: 99991111 mmHg     Is BP treated: Yes     HDL Cholesterol: 37.5 mg/dL     Total Cholesterol: 140 mg/dL I have reviewed the [facility] Soc history. Patient Active Problem List   Diagnosis Date Noted   Cirrhosis of liver ([place]) - h/o hep C 01/04/2020    Priority: High   Monoclonal gammopathy of unknown significance (MGUS) 01/23/2019    Priority: High   Osteoporosis with current pathological fracture 01/21/2019    Priority: High    History of chronic pred for urticaria:  Advised to start alendronate jan 2021, stop jan 2026     Controlled type 2 diabetes mellitus without complication, without long-term current use of insulin ([place]) 12/12/2018    Priority: High    Diagnosed 2020     Hyperlipidemia associated with type 2 diabetes mellitus ([place]) 12/12/2018    Priority: High    LDL goal of 70 given diabetes     Asthma with COPD ([place]) 05/25/2013    Priority: High   Obesity with serious comorbidity 03/07/2006    Priority: High   Major depression, recurrent, chronic ([place]) 03/07/2006    Priority: High    Sees psychiatry: Sharon Seller, [place]; [facility] with  Zella Ball, PsyD     Essential hypertension 03/07/2006    Priority: High   Chronic low back pain 03/07/2006    Priority: High   Insomnia 03/07/2006    Priority: High    On chronic Ambien (started by previous provider).      Degenerative joint disease (DJD) of lumbar spine 01/04/2020    Priority: Medium   History of hepatitis C 10/11/2017    Priority: Medium    S/P Treatment with sofosbuvir and simeprivir.  Followed by hep clinic.  Undetectable viral load - Cured.     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 Her smoking use included cigarettes. She has a 9.25 pack-year smoking history. She has never used smokeless tobacco. She reports that she does not drink alcohol and does not use drugs.  Review of Systems: Ophthalmic: negative for eye pain, loss of vision or double vision Cardiovascular: negative for chest pain Respiratory: negative for SOB or persistent cough Gastrointestinal: negative for abdominal pain Genitourinary: negative for dysuria or gross hematuria MSK: negative for foot lesions Neurologic: negative for weakness or gait disturbance  Objective  Vitals: BP 116/60   Pulse 92   Temp 98.1 F (36.7 C) (Temporal)   Wt 233 lb (105.7 kg)   SpO2 98%   BMI 38.77 kg/m  General: well appearing, no acute distress  Cardiovascular:  Nl S1 and S2, RRR without murmur, gallop or rub. no edema Respiratory:  Good breath sounds bilaterally, CTAB with normal effort, no rales Gastrointestinal:  normal BS, soft, nontender, no palpable masses,     Diabetic education: ongoing education regarding chronic disease management for diabetes was given today. We continue to reinforce the ABC's of diabetic management: A1c (<7 or 8 dependent upon patient), tight blood pressure control, and cholesterol management with goal LDL < 100 minimally. We discuss diet strategies, exercise recommendations, medication options and possible side effects. At each visit, we review recommended immunizations and preventive care recommendations for diabetics and stress that good diabetic control can prevent other problems. See below for this patient's data.   Commons side effects, risks, benefits, and alternatives for medications and treatment plan prescribed today were discussed, and the patient expressed understanding of the given instructions. Patient is instructed to call or message via MyChart if he/she has any questions or concerns regarding our treatment plan. No barriers to understanding were identified. We discussed Red Flag symptoms and signs in detail. Patient expressed understanding regarding what to do in case of urgent or emergency type symptoms.  Medication list was reconciled, printed and provided to the patient in AVS. Patient instructions and summary information was reviewed with the patient as documented in the AVS. This note was prepared with assistance of Dragon voice recognition software. Occasional wrong-word or sound-a-like substitutions may have occurred due to the inherent limitations of voice recognition software  This visit occurred during the SARS-CoV-2 public health emergency.  Safety protocols were in place, including screening questions prior to the visit, additional usage of staff PPE, and extensive cleaning of exam room while observing appropriate contact time as indicated for disinfecting solutions.

## 2020-08-17 NOTE — Patient Instructions (Signed)
Please return in 3 months for diabetes follow up   Start taking the trulicity once a week on Sunday mornings. You may experience mild nausea at first. This tends to resolve over time. Continue your jardiance and metformin as well.   If you have any questions or concerns, please don't hesitate to send me a message via MyChart or call the office at [phone]. Thank you for visiting with [place] today! It's our pleasure caring for you.

## 2020-08-23 ENCOUNTER — Encounter: Payer: Self-pay | Admitting: *Deleted

## 2020-08-30 ENCOUNTER — Telehealth: Payer: Self-pay | Admitting: Pharmacist

## 2020-08-30 NOTE — Chronic Care Management (AMB) (Addendum)
Chronic Care Management Pharmacy Assistant   Name: Stacy Campbell  MRN: [id number] DOB: [birth year]   Reason for Encounter: General Adherence Call    Recent office visits:  None  Recent consult visits:  08/11/2020 OV (dermatology) Merleen Nicely, MD; no medication changes indicated.  Hospital visits:  None in previous 6 months  Medications: Outpatient Encounter Medications as of 08/30/2020  Medication Sig Note   albuterol (VENTOLIN HFA) 108 (90 Base) MCG/ACT inhaler INHALE 2 PUFFS INTO THE LUNGS EVERY 6 HOURS AS NEEDED FOR WHEEZING OR SHORTNESS OF BREATH    amLODipine (NORVASC) 10 MG tablet Take 1 tablet (10 mg total) by mouth daily.    ARIPiprazole (ABILIFY) 5 MG tablet Take 1 tablet (5 mg total) by mouth daily.    ARNUITY ELLIPTA 100 MCG/ACT AEPB INHALE 1 PUFF INTO THE LUNGS DAILY    benzonatate (TESSALON) 100 MG capsule Take 1 capsule (100 mg total) by mouth 2 (two) times daily as needed for cough.    busPIRone (BUSPAR) 5 MG tablet Take 5 mg by mouth 2 (two) times daily.    carvedilol (COREG) 25 MG tablet Take 1 tablet (25 mg total) by mouth 2 (two) times daily with a meal.    desvenlafaxine (PRISTIQ) 50 MG 24 hr tablet Take 50 mg by mouth daily.    doxepin (SINEQUAN) 25 MG capsule Take 25 mg by mouth 3 (three) times daily as needed.    Dulaglutide (TRULICITY) A999333 0000000 SOPN Inject 0.75 mg into the skin once a week.    empagliflozin (JARDIANCE) 10 MG TABS tablet Take 1 tablet (10 mg total) by mouth daily.    EPINEPHrine 0.3 mg/0.3 mL IJ SOAJ injection Use as directed for severe allergic reaction    fluconazole (DIFLUCAN) 200 MG tablet Take 1 pill once a week for 12 weeks.    FLUoxetine (PROZAC) 20 MG capsule Take 1 capsule (20 mg total) by mouth daily.    glucose blood test strip Test blood sugar daily and record on blood sugar log    Lancets (ACCU-CHEK SOFT TOUCH) lancets Check blood sugar daily and record in log    lisinopril-hydrochlorothiazide  (ZESTORETIC) 20-25 MG tablet Take 1 tablet by mouth daily. 03/22/2020: Resumed on 03/14/2020 per patient   meclizine (ANTIVERT) 25 MG tablet Take 1 tablet (25 mg total) by mouth 3 (three) times daily as needed for dizziness.    metFORMIN (GLUCOPHAGE-XR) 500 MG 24 hr tablet TAKE 1 TABLET(500 MG) BY MOUTH DAILY WITH BREAKFAST    mycophenolate (CELLCEPT) 500 MG tablet Take 1 tablet by mouth 2 (two) times daily. Written by derm    nystatin (MYCOSTATIN/NYSTOP) powder Apply topically.    Oxycodone HCl 10 MG TABS Take by mouth.    promethazine (PHENERGAN) 25 MG tablet take 1 tablet by mouth every 8 hours if needed for nausea and vomiting    rosuvastatin (CRESTOR) 10 MG tablet Take 1 tablet (10 mg total) by mouth daily.    tiZANidine (ZANAFLEX) 4 MG tablet Take 4 mg by mouth 2 (two) times daily.    triamcinolone ointment (KENALOG) 0.1 % Apply 1 application topically 2 (two) times daily.    zolpidem (AMBIEN) 10 MG tablet Take 10 mg by mouth at bedtime.    No facility-administered encounter medications on file as of 08/30/2020.   Patient Questions: Have you had any problems recently with your health? Patient states she has not had any problems recently with her health.  Have you had any problems with your pharmacy?  Patient states she has not had any problems recently with her pharmacy.  What issues or side effects are you having with your medications? Patient states she is not have any issues or side effects with any of her medications.  What would you like me to pass along to Leata Mouse, CPP for him to help you with?  Patient states she does not have anything for me to pass along at this time.  What can we do to take care of you better? Patient did not have any suggestions.  Patient schedule a follow up appointment with the clinical pharmacist for 10/10/2020 at 1030 am.  Future Appointments  Date Time Provider [place]  09/15/2020 10:00 AM [facility] CARE [facility]  10/10/2020  10:30 AM [facility] CCM PHARMACIST [facility]  11/18/2020 11:30 AM [name], MD [facility]  07/28/2021  2:30 PM [facility] HEALTH COACH [facility]     Star Rating Drugs: None  April D Calhoun, [facility] Pharmacist Assistant [phone]

## 2020-09-13 ENCOUNTER — Other Ambulatory Visit: Payer: Self-pay | Admitting: Gastroenterology

## 2020-09-13 DIAGNOSIS — K7469 Other cirrhosis of liver: Secondary | ICD-10-CM

## 2020-09-15 ENCOUNTER — Other Ambulatory Visit: Payer: Self-pay

## 2020-09-15 ENCOUNTER — Ambulatory Visit (INDEPENDENT_AMBULATORY_CARE_PROVIDER_SITE_OTHER): Payer: Medicare HMO | Admitting: *Deleted

## 2020-09-15 DIAGNOSIS — I10 Essential (primary) hypertension: Secondary | ICD-10-CM

## 2020-09-15 DIAGNOSIS — E119 Type 2 diabetes mellitus without complications: Secondary | ICD-10-CM

## 2020-09-15 NOTE — Chronic Care Management (AMB) (Signed)
Chronic Care Management   CCM RN Visit Note  09/15/2020 Name: Stacy Campbell MRN: [id number] DOB: Nov 02, 1955  Subjective: Stacy Campbell is a 65 y.o. year old female who is a primary care patient of [name], MD. The care management team was consulted for assistance with disease management and care coordination needs.    Engaged with patient by telephone for follow up visit in response to provider referral for case management and/or care coordination services.   Consent to Services:  The patient was given information about Chronic Care Management services, agreed to services, and gave verbal consent prior to initiation of services.  Please see initial visit note for detailed documentation.   Patient agreed to services and verbal consent obtained.   Assessment: Review of patient past medical history, allergies, medications, health status, including review of consultants reports, laboratory and other test data, was performed as part of comprehensive evaluation and provision of chronic care management services.   SDOH (Social Determinants of Health) assessments and interventions performed:    CCM Care Plan  Allergies  Allergen Reactions   Hydrocodone Itching   Linalool    Methylisothiazolinone Other (See Comments)    Other reaction(s): Other (See Comments) Positive patch test Positive patch test    Propylene Glycol    Aspirin Itching and Other (See Comments)    Lower dose doesn't make her itch (she takes it every day)   Eggs Or Egg-Derived Products Nausea Only and Other (See Comments)    No reaction if in another food   Hydrocodone-Guaifenesin Rash    Outpatient Encounter Medications as of 09/15/2020  Medication Sig Note   ARNUITY ELLIPTA 100 MCG/ACT AEPB INHALE 1 PUFF INTO THE LUNGS DAILY    Dulaglutide (TRULICITY) A999333 0000000 SOPN Inject 0.75 mg into the skin once a week.    empagliflozin (JARDIANCE) 10 MG TABS tablet Take 1 tablet (10 mg total) by mouth  daily.    metFORMIN (GLUCOPHAGE-XR) 500 MG 24 hr tablet TAKE 1 TABLET(500 MG) BY MOUTH DAILY WITH BREAKFAST    mycophenolate (CELLCEPT) 500 MG tablet Take 1 tablet by mouth 2 (two) times daily. Written by derm    albuterol (VENTOLIN HFA) 108 (90 Base) MCG/ACT inhaler INHALE 2 PUFFS INTO THE LUNGS EVERY 6 HOURS AS NEEDED FOR WHEEZING OR SHORTNESS OF BREATH    amLODipine (NORVASC) 10 MG tablet Take 1 tablet (10 mg total) by mouth daily.    ARIPiprazole (ABILIFY) 5 MG tablet Take 1 tablet (5 mg total) by mouth daily.    benzonatate (TESSALON) 100 MG capsule Take 1 capsule (100 mg total) by mouth 2 (two) times daily as needed for cough.    busPIRone (BUSPAR) 5 MG tablet Take 5 mg by mouth 2 (two) times daily.    carvedilol (COREG) 25 MG tablet Take 1 tablet (25 mg total) by mouth 2 (two) times daily with a meal.    desvenlafaxine (PRISTIQ) 50 MG 24 hr tablet Take 50 mg by mouth daily.    doxepin (SINEQUAN) 25 MG capsule Take 25 mg by mouth 3 (three) times daily as needed.    EPINEPHrine 0.3 mg/0.3 mL IJ SOAJ injection Use as directed for severe allergic reaction    fluconazole (DIFLUCAN) 200 MG tablet Take 1 pill once a week for 12 weeks.    FLUoxetine (PROZAC) 20 MG capsule Take 1 capsule (20 mg total) by mouth daily.    glucose blood test strip Test blood sugar daily and record on blood sugar log  Lancets (ACCU-CHEK SOFT TOUCH) lancets Check blood sugar daily and record in log    lisinopril-hydrochlorothiazide (ZESTORETIC) 20-25 MG tablet Take 1 tablet by mouth daily. 03/22/2020: Resumed on 03/14/2020 per patient   meclizine (ANTIVERT) 25 MG tablet Take 1 tablet (25 mg total) by mouth 3 (three) times daily as needed for dizziness.    nystatin (MYCOSTATIN/NYSTOP) powder Apply topically.    Oxycodone HCl 10 MG TABS Take by mouth.    promethazine (PHENERGAN) 25 MG tablet take 1 tablet by mouth every 8 hours if needed for nausea and vomiting    rosuvastatin (CRESTOR) 10 MG tablet Take 1 tablet (10 mg  total) by mouth daily.    tiZANidine (ZANAFLEX) 4 MG tablet Take 4 mg by mouth 2 (two) times daily.    triamcinolone ointment (KENALOG) 0.1 % Apply 1 application topically 2 (two) times daily.    zolpidem (AMBIEN) 10 MG tablet Take 10 mg by mouth at bedtime.    No facility-administered encounter medications on file as of 09/15/2020.    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   Cirrhosis of liver ([place]) - h/o hep C 01/04/2020   Degenerative joint disease (DJD) of lumbar spine 01/04/2020   Monoclonal gammopathy of unknown significance (MGUS) 01/23/2019   Osteoporosis with current pathological fracture 01/21/2019   Controlled type 2 diabetes mellitus without complication, without long-term current use of insulin ([place]) 12/12/2018   Hyperlipidemia associated with type 2 diabetes mellitus ([place]) 12/12/2018   Seasonal and perennial allergic rhinoconjunctivitis 02/12/2018   History of hepatitis C 10/11/2017   History of atrial fibrillation 10/11/2017   Former smoker 10/11/2017   Dyshidrotic eczema 09/10/2017   GERD (gastroesophageal reflux disease) 02/15/2017   Psoriasis 07/10/2015   Asthma with COPD ([place]) 05/25/2013   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   Obesity with serious comorbidity 03/07/2006   Major depression, recurrent, chronic ([place]) 03/07/2006   Essential hypertension 03/07/2006   Chronic low back pain 03/07/2006   Insomnia 03/07/2006    Conditions to be addressed/monitored:HTN and DMII  Care Plan : Diabetes Type 2 (Adult)  Updates made by Leona Singleton, RN since 09/15/2020 12:00 AM     Problem: Patient will maintain Hgb A1C of below 7 within the next 90 days.   Priority: Medium     Long-Range Goal: Patient will maintain Hgb A1C of below 7 within the next 90 days   Start Date: 03/08/2020  Expected End Date: 01/06/2021  Recent Progress: On track  Priority: Medium  Note:   Objective:  Lab Results  Component Value Date   HGBA1C 8.3  (A) 08/17/2020   Lab Results  Component Value Date   CREATININE 1.29 (H) 05/06/2020   CREATININE 1.53 (H) 02/15/2020   CREATININE 0.80 01/04/2020  Current Barriers:  Knowledge Deficits related to basic Diabetes pathophysiology and self care/management.  Patient's current Hgb A1C level increased to 8.3.   Continues to monitor blood sugars twice a day.  Fasting blood sugar this morning was 141 with recent fasting ranges of 120-150's.  Denies any hypoglycemic episodes.  Reports compliance with all medications.   Difficulties managing chronic conditions related to pain and anxiety.  Attends pain management clinic to manage chronic back pain. Reports pain limits activity and prevents her from spending time with her family.  Patient states she continues to see therapist and psychiatrist monthly for her anxiety and depression.   Case Manager Clinical Goal(s):  Patient will demonstrate improved adherence to prescribed treatment  plan for diabetes self care/management as evidenced by: daily monitoring and recording of CBG,  adherence to ADA/ carb modified diet, adherence to prescribed medication regimen, contacting provider for new or worsened symptoms or questions Interventions:  Collaboration with [name], MD regarding development and update of comprehensive plan of care as evidenced by provider attestation and co-signature Inter-disciplinary care team collaboration (see longitudinal plan of care) Reviewed medications with patient and discussed importance of medication adherence Discussed plans with patient for ongoing care management follow up and provided patient with direct contact information for care management team Advised patient, providing education and rationale, to check cbg twice a day and record, calling primary care provider for findings outside established parameters.   Reviewed current glucose readings and discussed ways to help reduce Encouraged to notify provider for sustained  elevated blood sugars Discussed weight gain/loss and encouraged to increase activity as back pain allows Encouraged to discuss pain limiting effects with pain management provider Discussed and encouraged heart healthy carbohydrate modified diet/food options; sent education on diabetic diet/food choices Discussed formal diabetes and nutrition classes an patient agrees/interested Encouraged to attend scheduled medical appointments, PCP 08/17/20 Sent [facility] Exercise Program Activity Booklet and encouraged patient to use No significant recommendations/changes Patient Goals/Self-Care Activities: Continue to check blood sugars twice a day Enter blood sugar readings into daily log Take the blood sugar log to all doctor visits  Request [employer] to fax eye exam results to Dr. Jonni Sanger office Increase activity slowly as tolerated Follow Up Plan: The care management team will reach out to the patient again over the next 45 business days.       Care Plan : Hypertension (Adult)  Updates made by Leona Singleton, RN since 09/15/2020 12:00 AM     Problem: Hypertension (Hypertension)   Priority: Medium     Long-Range Goal: Hypertension Monitored   Start Date: 03/08/2020  Expected End Date: 09/06/2020  This Visit's Progress: On track  Recent Progress: On track  Priority: Medium  Note:   Current Barriers:  Knowledge Deficits related to basic understanding of hypertension pathophysiology and self care management.  Patient reporting latest BP ranges of 120-130/70-80's. Denies any light headiness or dizziness at this time.  Denies shortness of breath or swelling.  Denies any episodes of hypo hypertension,    Difficulties maintaining food in the home.  Continues to report having trouble affording food and often times about to run out of food before having money to buy more.  Discussed DSNP benefit with [employer] of healthy foods $75 a month (patient now has along with over the counter benefit quarterly  catalog). Nurse Case Manager Clinical Goal(s):  Over the next 90 days, patient will verbalize understanding of plan for hypertension management Over the next 90 days, patient will demonstrate improved adherence to prescribed treatment plan for hypertension as evidenced by taking all medications as prescribed, monitoring and recording blood pressure as directed, adhering to low sodium/DASH diet Interventions:  Collaboration with [name], MD regarding development and update of comprehensive plan of care as evidenced by provider attestation and co-signature Inter-disciplinary care team collaboration (see longitudinal plan of care) Evaluation of current treatment plan related to hypertension self management and patient's adherence to plan as established by provider. Reviewed medications with patient and discussed importance of compliance Encouraged patient to contact CCM Pharmacist or RNCM for any questions or concerns Discussed plans with patient for ongoing care management follow up and provided patient with direct contact information for care management  team Advised patient, providing education and rationale, to monitor blood pressure daily and record, calling PCP for findings outside established parameters.  Reviewed signs and symptoms of hypotension and hypertension; encouraged patient to review previous education sent Verified patient has list of food banks and encouraged patient to review/contact list of food banks provided by [facility] Encouraged her to continue to use Calendar Booklet to log blood pressure readings for provider review Encouraged low salt carbohydrate modified diabetic diet, sent education on DASH diet Encouraged to continue with therapy and discussed not stopping depression medication Discussed increasing activity as tolerated Discussed/confirmed healthy food $75 a month allowance from [place] benefit and confirmed she has over the counter  catalog Patient Goals/Self-Care Activities:  Over the next 45 days, patient will: Continue to check blood sugars twice a day Enter blood sugar readings into daily log Take the blood sugar log to all doctor visits  Increase activity slowly as tolerated Follow Up Plan: The care management team will reach out to the patient again over the next 45 business days.         Plan:The care management team will reach out to the patient again over the next 45 business days.  Hubert Azure RN, MSN RN Care Management Coordinator  [place] [phone] [email or web address]

## 2020-09-15 NOTE — Patient Instructions (Signed)
Visit Information  PATIENT GOALS:  Goals Addressed             This Visit's Progress    COMPLETED: (RNCM) Find Help in My Community   On track    Timeframe:  Short-Term Goal Priority:  High Start Date:   03/08/20                          Expected End Date:   10/07/20                    Follow Up Date 09/15/20    Call 211 when I need some help Follow-up on any referrals for help I am given Make a list of family or friends that I can call  Review list of agencies to assist with food given by the [place] to ask about $75 food allowance with DSNP benefit   Has the $75 healthy food allowance, has over the counter catalog   Why is this important?   Knowing how and where to find help for yourself or family in your neighborhood and community is an important skill.  You will want to take some steps to learn how.    Notes:      (RNCM) Monitor and Manage My Blood Sugar-Diabetes Type 2   On track    Timeframe:  Long-Range Goal Priority:  Medium Start Date:   03/08/20                          Expected End Date:   03/07/21                  Follow Up Date 10/20/20    Continue to check blood sugars twice a day Enter blood sugar readings into daily log Take the blood sugar log to all doctor visits  Increase activity slowly as tolerated   Why is this important?   Checking your blood sugar at home helps to keep it from getting very high or very low.  Writing the results in a diary or log helps the doctor know how to care for you.  Your blood sugar log should have the time, date and the results.  Also, write down the amount of insulin or other medicine that you take.  Other information, like what you ate, exercise done and how you were feeling, will also be helpful.     Notes:      (RNCM) Track and Manage My Blood Pressure-Hypertension   On track    Timeframe:  Long-Range Goal Priority:  Medium Start Date:  03/08/20                           Expected End Date:   03/07/21                   Follow Up Date  10/20/20    Check blood pressure daily  Write blood pressure results in a log  Monitor yourself for hypotension and hypertension Follow low salt carbohydrate modified diabetic diet   Why is this important?   You won't feel high blood pressure, but it can still hurt your blood vessels.  High blood pressure can cause heart or kidney problems. It can also cause a stroke.  Making lifestyle changes like losing a little weight or eating less salt will help.  Checking your blood pressure  at home and at different times of the day can help to control blood pressure.  If the doctor prescribes medicine remember to take it the way the doctor ordered.  Call the office if you cannot afford the medicine or if there are questions about it.     Notes:         Patient verbalizes understanding of instructions provided today and agrees to view in [place].   The care management team will reach out to the patient again over the next 45 business days.   Hubert Azure RN, MSN RN Care Management Coordinator  [place] [phone] [email or web address]

## 2020-09-16 ENCOUNTER — Ambulatory Visit
Admission: RE | Admit: 2020-09-16 | Discharge: 2020-09-16 | Disposition: A | Discharge status: Home / Self Care | Payer: Medicare HMO | Source: Ambulatory Visit | Attending: Gastroenterology | Admitting: Gastroenterology

## 2020-09-16 DIAGNOSIS — K7469 Other cirrhosis of liver: Secondary | ICD-10-CM

## 2020-09-25 ENCOUNTER — Other Ambulatory Visit: Payer: Self-pay | Admitting: Family Medicine

## 2020-10-07 DIAGNOSIS — E119 Type 2 diabetes mellitus without complications: Secondary | ICD-10-CM

## 2020-10-07 DIAGNOSIS — I10 Essential (primary) hypertension: Secondary | ICD-10-CM | POA: Diagnosis not present

## 2020-10-10 ENCOUNTER — Ambulatory Visit (INDEPENDENT_AMBULATORY_CARE_PROVIDER_SITE_OTHER): Payer: Medicare HMO | Admitting: Pharmacist

## 2020-10-10 DIAGNOSIS — E119 Type 2 diabetes mellitus without complications: Secondary | ICD-10-CM

## 2020-10-10 DIAGNOSIS — I10 Essential (primary) hypertension: Secondary | ICD-10-CM

## 2020-10-10 NOTE — Patient Instructions (Addendum)
Visit Information   Goals Addressed             This Visit's Progress    (RNCM) Monitor and Manage My Blood Sugar-Diabetes Type 2       Timeframe:  Long-Range Goal Priority:  Medium Start Date:   03/08/20                          Expected End Date:   03/07/21                  Follow Up Date 10/20/20    Continue to check blood sugars twice a day Enter blood sugar readings into daily log Take the blood sugar log to all doctor visits  Increase activity slowly as tolerated   Why is this important?   Checking your blood sugar at home helps to keep it from getting very high or very low.  Writing the results in a diary or log helps the doctor know how to care for you.  Your blood sugar log should have the time, date and the results.  Also, write down the amount of insulin or other medicine that you take.  Other information, like what you ate, exercise done and how you were feeling, will also be helpful.     Notes:   10/10/20 - patient stated glucose is improving       Patient Care Plan: Diabetes Type 2 (Adult)     Problem Identified: Patient will maintain Hgb A1C of below 7 within the next 90 days.   Priority: Medium     Long-Range Goal: Patient will maintain Hgb A1C of below 7 within the next 90 days   Start Date: 03/08/2020  Expected End Date: 01/06/2021  Recent Progress: On track  Priority: Medium  Note:   Objective:  Lab Results  Component Value Date   HGBA1C 8.3 (A) 08/17/2020   Lab Results  Component Value Date   CREATININE 1.29 (H) 05/06/2020   CREATININE 1.53 (H) 02/15/2020   CREATININE 0.80 01/04/2020  Current Barriers:  Knowledge Deficits related to basic Diabetes pathophysiology and self care/management.  Patient's current Hgb A1C level increased to 8.3.   Continues to monitor blood sugars twice a day.  Fasting blood sugar this morning was 141 with recent fasting ranges of 120-150's.  Denies any hypoglycemic episodes.  Reports compliance with all  medications.   Difficulties managing chronic conditions related to pain and anxiety.  Attends pain management clinic to manage chronic back pain. Reports pain limits activity and prevents her from spending time with her family.  Patient states she continues to see therapist and psychiatrist monthly for her anxiety and depression.   Case Manager Clinical Goal(s):  Patient will demonstrate improved adherence to prescribed treatment plan for diabetes self care/management as evidenced by: daily monitoring and recording of CBG,  adherence to ADA/ carb modified diet, adherence to prescribed medication regimen, contacting provider for new or worsened symptoms or questions Interventions:  Collaboration with [name], MD regarding development and update of comprehensive plan of care as evidenced by provider attestation and co-signature Inter-disciplinary care team collaboration (see longitudinal plan of care) Reviewed medications with patient and discussed importance of medication adherence Discussed plans with patient for ongoing care management follow up and provided patient with direct contact information for care management team Advised patient, providing education and rationale, to check cbg twice a day and record, calling primary care provider for findings outside established parameters.  Reviewed current glucose readings and discussed ways to help reduce Encouraged to notify provider for sustained elevated blood sugars Discussed weight gain/loss and encouraged to increase activity as back pain allows Encouraged to discuss pain limiting effects with pain management provider Discussed and encouraged heart healthy carbohydrate modified diet/food options; sent education on diabetic diet/food choices Discussed formal diabetes and nutrition classes an patient agrees/interested Encouraged to attend scheduled medical appointments, PCP 08/17/20 Sent [facility] Exercise Program Activity Booklet and encouraged  patient to use No significant recommendations/changes Patient Goals/Self-Care Activities: Continue to check blood sugars twice a day Enter blood sugar readings into daily log Take the blood sugar log to all doctor visits  Request [employer] to fax eye exam results to Dr. Jonni Sanger office Increase activity slowly as tolerated Follow Up Plan: The care management team will reach out to the patient again over the next 45 business days.       Patient Care Plan: Hypertension (Adult)     Problem Identified: Hypertension (Hypertension)   Priority: Medium     Long-Range Goal: Hypertension Monitored   Start Date: 03/08/2020  Expected End Date: 09/06/2020  This Visit's Progress: On track  Recent Progress: On track  Priority: Medium  Note:   Current Barriers:  Knowledge Deficits related to basic understanding of hypertension pathophysiology and self care management.  Patient reporting latest BP ranges of 120-130/70-80's. Denies any light headiness or dizziness at this time.  Denies shortness of breath or swelling.  Denies any episodes of hypo hypertension,    Difficulties maintaining food in the home.  Continues to report having trouble affording food and often times about to run out of food before having money to buy more.  Discussed DSNP benefit with [employer] of healthy foods $75 a month (patient now has along with over the counter benefit quarterly catalog). Nurse Case Manager Clinical Goal(s):  Over the next 90 days, patient will verbalize understanding of plan for hypertension management Over the next 90 days, patient will demonstrate improved adherence to prescribed treatment plan for hypertension as evidenced by taking all medications as prescribed, monitoring and recording blood pressure as directed, adhering to low sodium/DASH diet Interventions:  Collaboration with [name], MD regarding development and update of comprehensive plan of care as evidenced by provider attestation and  co-signature Inter-disciplinary care team collaboration (see longitudinal plan of care) Evaluation of current treatment plan related to hypertension self management and patient's adherence to plan as established by provider. Reviewed medications with patient and discussed importance of compliance Encouraged patient to contact CCM Pharmacist or RNCM for any questions or concerns Discussed plans with patient for ongoing care management follow up and provided patient with direct contact information for care management team Advised patient, providing education and rationale, to monitor blood pressure daily and record, calling PCP for findings outside established parameters.  Reviewed signs and symptoms of hypotension and hypertension; encouraged patient to review previous education sent Verified patient has list of food banks and encouraged patient to review/contact list of food banks provided by [facility] Gu[employer] Encouraged her to continue to use Calendar Booklet to log blood pressure readings for provider review Encouraged low salt carbohydrate modified diabetic diet, sent education on DASH diet Encouraged to continue with therapy and discussed not stopping depression medication Discussed increasing activity as tolerated Discussed/confirmed healthy food $75 a month allowance from [place] benefit and confirmed she has over the counter catalog Patient Goals/Self-Care Activities:  Over the next 45 days, patient will: Continue to check  blood sugars twice a day Enter blood sugar readings into daily log Take the blood sugar log to all doctor visits  Increase activity slowly as tolerated Follow Up Plan: The care management team will reach out to the patient again over the next 45 business days.        Patient Care Plan: CCM Pharmacy Care Plan     Problem Identified: Hypertension, Hyperlipidemia, Diabetes, Atrial Fibrillation, GERD, COPD, Chronic Kidney Disease, Hypothyroidism, Anxiety and  Osteoporosis   Priority: High     Long-Range Goal: Disease Management   Start Date: 03/09/2020  Expected End Date: 03/22/2021  Recent Progress: On track  Priority: High  Note:   Current Barriers:  Work to optimize lifestyle choices through diet and exercise  Pharmacist Clinical Goal(s):  Over the next 365 days, patient will verbalize ability to afford treatment regimen achieve adherence to monitoring guidelines and medication adherence to achieve therapeutic efficacy contact provider office for questions/concerns as evidenced notation of same in electronic health record through collaboration with PharmD and provider.   Interventions: 1:1 collaboration with [name], MD regarding development and update of comprehensive plan of care as evidenced by provider attestation and co-signature Inter-disciplinary care team collaboration (see longitudinal plan of care) Comprehensive medication review performed; medication list updated in electronic medical record  Hypertension (BP goal <140/90) -Not ideally controlled - improve 4/29 visit  --Current treatment: Lisinopril-HCTZ 20-25 mg (resumed) Amlodipine 10 mg once daily (started 02/15/2020) Carvedilol 25 mg twice daily  -Current home readings: has BP cuff through [place] - at goal.  -Denies hypotensive/hypertensive symptoms -Educated on BP goals and benefits of medications for prevention of heart attack, stroke and kidney damage; Daily salt intake goal < 2300 mg; Exercise goal of 150 minutes per week; Importance of home blood pressure monitoring; Proper BP monitoring technique; Symptoms of hypotension and importance of maintaining adequate hydration; -Counseled to monitor BP at home twice daily, document, and provide log at future appointments -Counseled on diet and exercise extensively  Update 10/10/20 BP has been controlled No changes to any medications Recommended she continue to monitor and report any consistent elevations or  symptoms to providers.   Asthma with COPD (Goal: control symptoms and prevent exacerbations) -Controlled -Current treatment  Ventolin HFA 2 puffs every 6 hours as needed for shortness of breath Arnuity Ellipta 100 mcg/act - 1 puff into lungs daily -Exacerbations requiring treatment in last 6 months: 0 -Patient reports consistent use of maintenance inhaler -Frequency of rescue inhaler use: rare -Counseled on Proper inhaler technique; -Recommended to continue current medication    Diabetes (A1c goal <7%) -Controlled -Current medications: Jardiance 10 mg once daily Metformin 1000mg  twice daily Trulicity 0.75mg  once weekly -Current home glucose readings fasting glucose: 128, 130 - most recent readings -Denies hypoglycemic/hyperglycemic symptoms -Current meal patterns:  Portion control, watches juice consumption, some crackers for snack. -Current exercise: home chair exercises for back -Reviewed side effects - no problems noted -Counseled on diet and exercise extensively Recommended to continue current medication  Update 10/10/20 Patient tolerating her Trulicity fine.  She has no complaints of the copay currently it is affordable.  Stated her sugars have been "better," however she was not at home to be able to tell me readings.  Has upcoming appointment with Dr. Jonni Sanger. Would recommend recheck A1c If A1c continues to be elevated, would recommend increase Jardiance to 25mg  daily.         Patient verbalizes understanding of instructions provided today and agrees to view in [place].  Telephone follow up appointment with pharmacy team member scheduled for: 6 months  Edythe Clarity, [place]

## 2020-10-10 NOTE — Progress Notes (Signed)
Chronic Care Management Pharmacy Note  10/10/2020 Name:  Stacy Campbell MRN:  [id number] DOB:  1955-01-28  Subjective: Stacy Campbell is an 65 y.o. year old female who is a primary patient of [name], MD.  The CCM team was consulted for assistance with disease management and care coordination needs.   Engaged with patient by telephone for follow up visit in response to provider referral for pharmacy case management and/or care coordination services.   Consent to Services:  The patient was given information about Chronic Care Management services, agreed to services, and gave verbal consent prior to initiation of services.  Please see initial visit note for detailed documentation.  Patient Care Team: [name], MD as PCP - General (Family Medicine) Arta Silence, MD as Consulting Physician (Gastroenterology) Linward Headland, NP as Nurse Practitioner (Psychology) Leona Singleton, RN as Case Manager Edythe Clarity, [facility] (Pharmacist)    Objective: Lab Results  Component Value Date   CREATININE 1.29 (H) 05/06/2020   BUN 30 (H) 05/06/2020   GFR 43.77 (L) 05/06/2020   GFRNONAA 59 (L) 11/24/2018   GFRAA 68 11/24/2018   NA 137 05/06/2020   K 5.0 05/06/2020   CALCIUM 9.4 05/06/2020   CO2 26 05/06/2020   Lab Results  Component Value Date/Time   HGBA1C 8.3 (A) 08/17/2020 11:35 AM   HGBA1C 6.9 (A) 05/06/2020 10:02 AM   HGBA1C 6.8 08/14/2019 08:38 AM   HGBA1C 9.8 (A) 11/24/2018 02:50 PM   GFR 43.77 (L) 05/06/2020 10:27 AM   GFR 35.72 (L) 02/15/2020 10:41 AM   MICROALBUR <0.7 01/04/2020 10:46 AM    Last diabetic Eye exam: No results found for: HMDIABEYEEXA  Last diabetic Foot exam: No results found for: HMDIABFOOTEX  Lab Results  Component Value Date   CHOL 140 02/15/2020   HDL 37.50 (L) 02/15/2020   LDLCALC 73 02/15/2020   TRIG 148.0 02/15/2020   CHOLHDL 4 02/15/2020   Hepatic Function Latest Ref Rng & Units 05/06/2020 02/15/2020 01/04/2020   Total Protein 6.0 - 8.3 g/dL 8.0 7.5 7.6  Albumin 3.5 - 5.2 g/dL 4.3 3.9 4.0  AST 0 - 37 U/L 37 25 30  ALT 0 - 35 U/L '27 20 24  ' Alk Phosphatase 39 - 117 U/L 77 56 52  Total Bilirubin 0.2 - 1.2 mg/dL 0.3 0.3 0.3  Bilirubin, Direct 0.00 - 0.40 mg/dL - - -   Lab Results  Component Value Date/Time   TSH 3.34 01/04/2020 10:46 AM   TSH 1.960 01/15/2018 10:49 AM   TSH 2.73 02/08/2016 03:23 PM   FREET4 0.98 07/30/2007 08:40 PM   CBC Latest Ref Rng & Units 01/04/2020 11/24/2018 01/16/2018  WBC 4.0 - 10.5 K/uL 5.5 7.4 7.8  Hemoglobin 12.0 - 15.0 g/dL 13.0 13.6 14.8  Hematocrit 36.0 - 46.0 % 41.6 43.5 48.5(H)  Platelets 150.0 - 400.0 K/uL 197.0 229 281   Lab Results  Component Value Date/Time   VD25OH 20 (L) 05/10/2010 04:34 PM   VD25OH <10 ng/mL (L) 04/06/2009 08:40 PM    Clinical ASCVD:  The 10-year ASCVD risk score (Arnett DK, et al., 2019) is: 13.2%   Values used to calculate the score:     Age: 70 years     Sex: Female     Is Non-Hispanic African American: Yes     Diabetic: Yes     Tobacco smoker: No     Systolic Blood Pressure: 474 mmHg     Is BP treated: Yes  HDL Cholesterol: 37.5 mg/dL     Total Cholesterol: 140 mg/dL    Depression screen [facility] 2/9 07/15/2020 03/08/2020 01/04/2020  Decreased Interest 0 3 3  Down, Depressed, Hopeless '1 1 3  ' PHQ - 2 Score '1 4 6  ' Altered sleeping - 1 1  Tired, decreased energy - 2 3  Change in appetite - 0 2  Feeling bad or failure about yourself  - 1 1  Trouble concentrating - 0 0  Moving slowly or fidgety/restless - 0 0  Suicidal thoughts - 0 0  PHQ-9 Score - 8 13  Difficult doing work/chores - Somewhat difficult -  Some recent data might be hidden    Social History   Tobacco Use  Smoking Status Former   Packs/day: 0.25   Years: 37.00   Pack years: 9.25   Types: Cigarettes   Quit date: 03/30/2008   Years since quitting: 12.5  Smokeless Tobacco Never   BP Readings from Last 3 Encounters:  08/17/20 116/60  05/06/20 136/78   02/15/20 (!) 150/82   Pulse Readings from Last 3 Encounters:  08/17/20 92  05/06/20 71  02/15/20 84   Wt Readings from Last 3 Encounters:  08/17/20 233 lb (105.7 kg)  05/06/20 228 lb 12.8 oz (103.8 kg)  02/15/20 218 lb 12.8 oz (99.2 kg)   Assessment/Interventions: Review of patient past medical history, allergies, medications, health status, including review of consultants reports, laboratory and other test data, was performed as part of comprehensive evaluation and provision of chronic care management services.   SDOH:  (Social Determinants of Health) assessments and interventions performed: Yes  CCM Care Plan Allergies  Allergen Reactions   Hydrocodone Itching   Linalool    Methylisothiazolinone Other (See Comments)    Other reaction(s): Other (See Comments) Positive patch test Positive patch test    Propylene Glycol    Aspirin Itching and Other (See Comments)    Lower dose doesn't make her itch (she takes it every day)   Eggs Or Egg-Derived Products Nausea Only and Other (See Comments)    No reaction if in another food   Hydrocodone-Guaifenesin Rash   Medications Reviewed Today     Reviewed by Edythe Clarity, [facility] (Pharmacist) on 10/10/20 at 1157  Med List Status: <None>   Medication Order Taking? Sig Documenting Provider Last Dose Status Informant  albuterol (VENTOLIN HFA) 108 (90 Base) MCG/ACT inhaler [id number] Yes INHALE 2 PUFFS INTO THE LUNGS EVERY 6 HOURS AS NEEDED FOR WHEEZING OR SHORTNESS OF BREATH [name], MD Taking Active   amLODipine (NORVASC) 10 MG tablet [id number] Yes Take 1 tablet (10 mg total) by mouth daily. [name], MD Taking Active   ARIPiprazole (ABILIFY) 5 MG tablet [id number] Yes Take 1 tablet (5 mg total) by mouth daily. Sherene Sires, DO Taking Active   ARNUITY ELLIPTA 100 MCG/ACT AEPB [id number] Yes INHALE 1 PUFF INTO THE LUNGS DAILY [name], MD Taking Active   benzonatate (TESSALON) 100 MG capsule [id number] Yes Take 1  capsule (100 mg total) by mouth 2 (two) times daily as needed for cough. [name], MD Taking Active   busPIRone (BUSPAR) 5 MG tablet [id number] Yes Take 5 mg by mouth 2 (two) times daily. [provider] Taking Active   carvedilol (COREG) 25 MG tablet [id number] Yes Take 1 tablet (25 mg total) by mouth 2 (two) times daily with a meal. [name], MD Taking Active   desvenlafaxine (PRISTIQ) 50 MG 24 hr tablet [id number]  Yes Take 50 mg by mouth daily. [provider] Taking Active   doxepin (SINEQUAN) 25 MG capsule [id number] Yes Take 25 mg by mouth 3 (three) times daily as needed. [provider] Taking Active   Dulaglutide (TRULICITY) 6.80 SU/1.1SR SOPN [id number] Yes Inject 0.75 mg into the skin once a week. [name], MD Taking Active   empagliflozin (JARDIANCE) 10 MG TABS tablet [id number] Yes Take 1 tablet (10 mg total) by mouth daily. [name], MD Taking Active   EPINEPHrine 0.3 mg/0.3 mL IJ SOAJ injection [id number] Yes Use as directed for severe allergic reaction Garnet Sierras, DO Taking Active   fluconazole (DIFLUCAN) 200 MG tablet [id number] Yes Take 1 pill once a week for 12 weeks. [provider] Taking Active   FLUoxetine (PROZAC) 20 MG capsule [id number] Yes Take 1 capsule (20 mg total) by mouth daily. Linward Headland, NP Taking Active Self  glucose blood test strip [id number] Yes Test blood sugar daily and record on blood sugar log Sherene Sires, DO Taking Active   Lancets (ACCU-CHEK SOFT TOUCH) lancets [id number] Yes Check blood sugar daily and record in log Bland, Scott, DO Taking Active   lisinopril-hydrochlorothiazide (ZESTORETIC) 20-25 MG tablet [id number] Yes Take 1 tablet by mouth daily. [name], MD Taking Active            Med Note [facility]   Tue Mar 22, 2020 10:07 AM) Resumed on 03/14/2020 per patient  meclizine (ANTIVERT) 25 MG tablet [id number] Yes Take 1 tablet (25 mg total) by mouth 3 (three) times daily as  needed for dizziness. [name], MD Taking Active   metFORMIN (GLUCOPHAGE-XR) 500 MG 24 hr tablet [id number] Yes TAKE 1 TABLET(500 MG) BY MOUTH DAILY WITH BREAKFAST Lattie Haw, MD Taking Active   mycophenolate (CELLCEPT) 500 MG tablet [id number] Yes Take 1 tablet by mouth 2 (two) times daily. Written by derm [provider] Taking Active   nystatin (MYCOSTATIN/NYSTOP) powder [id number] Yes Apply topically. [provider] Taking Active   nystatin cream (MYCOSTATIN) [id number] Yes APPLY TOPICALLY TO THE AFFECTED AREA TWICE DAILY FOR 7 DAYS THEN AS NEEDED [name], MD Taking Active   Oxycodone HCl 10 MG TABS [id number] Yes Take by mouth. [provider] Taking Active   promethazine (PHENERGAN) 25 MG tablet [id number] Yes take 1 tablet by mouth every 8 hours if needed for nausea and vomiting Sherene Sires, DO Taking Active   rosuvastatin (CRESTOR) 10 MG tablet [id number] Yes Take 1 tablet (10 mg total) by mouth daily. [name], MD Taking Active   tiZANidine (ZANAFLEX) 4 MG tablet [id number] Yes Take 4 mg by mouth 2 (two) times daily. [provider] Taking Active Self  triamcinolone ointment (KENALOG) 0.1 % [id number] Yes Apply 1 application topically 2 (two) times daily. [provider] Taking Active Self  zolpidem (AMBIEN) 10 MG tablet [id number] Yes Take 10 mg by mouth at bedtime. [provider] Taking Active            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   Cirrhosis of liver ([place]) - h/o hep C 01/04/2020   Degenerative joint disease (DJD) of lumbar spine 01/04/2020   Monoclonal gammopathy of unknown significance (MGUS) 01/23/2019   Osteoporosis with current pathological fracture 01/21/2019   Controlled type 2 diabetes mellitus without complication, without long-term current use of insulin ([place]) 12/12/2018   Hyperlipidemia associated with type 2 diabetes mellitus (  [place]) 12/12/2018    Seasonal and perennial allergic rhinoconjunctivitis 02/12/2018   History of hepatitis C 10/11/2017   History of atrial fibrillation 10/11/2017   Former smoker 10/11/2017   Dyshidrotic eczema 09/10/2017   GERD (gastroesophageal reflux disease) 02/15/2017   Psoriasis 07/10/2015   Asthma with COPD ([place]) 05/25/2013   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   Obesity with serious comorbidity 03/07/2006   Major depression, recurrent, chronic ([place]) 03/07/2006   Essential hypertension 03/07/2006   Chronic low back pain 03/07/2006   Insomnia 03/07/2006   Immunization History  Administered Date(s) Administered   Hepatitis B, adult 10/12/2014, 11/09/2014, 04/11/2015   Influenza Split 11/14/2010, 10/09/2011   Influenza Whole 10/13/2007, 01/10/2009, 11/22/2009   Influenza,inj,Quad PF,6+ Mos 10/10/2012, 10/08/2013, 10/15/2014, 09/29/2015, 11/06/2016, 09/10/2017, 10/15/2018, 09/30/2019   PFIZER(Purple Top)SARS-COV-2 Vaccination 03/20/2019, 04/15/2019, 10/15/2019   Pneumococcal Polysaccharide-23 01/10/2009   Td 01/09/1999, 01/10/2009    Conditions to be addressed/monitored:  Hypertension, Hyperlipidemia, Diabetes, Atrial Fibrillation, GERD, COPD, Chronic Kidney Disease, Hypothyroidism, Anxiety and Osteoporosis Care Plan : [place]  Updates made by Edythe Clarity, RPH since 10/10/2020 12:00 AM     Problem: Hypertension, Hyperlipidemia, Diabetes, Atrial Fibrillation, GERD, COPD, Chronic Kidney Disease, Hypothyroidism, Anxiety and Osteoporosis   Priority: High     Long-Range Goal: Disease Management   Start Date: 03/09/2020  Expected End Date: 03/22/2021  Recent Progress: On track  Priority: High  Note:   Current Barriers:  Work to optimize lifestyle choices through diet and exercise  Pharmacist Clinical Goal(s):  Over the next 365 days, patient will verbalize ability to afford treatment regimen achieve adherence to monitoring guidelines and medication adherence to achieve  therapeutic efficacy contact provider office for questions/concerns as evidenced notation of same in electronic health record through collaboration with PharmD and provider.   Interventions: 1:1 collaboration with [name], MD regarding development and update of comprehensive plan of care as evidenced by provider attestation and co-signature Inter-disciplinary care team collaboration (see longitudinal plan of care) Comprehensive medication review performed; medication list updated in electronic medical record  Hypertension (BP goal <140/90) -Not ideally controlled - improve 4/29 visit  --Current treatment: Lisinopril-HCTZ 20-25 mg (resumed) Amlodipine 10 mg once daily (started 02/15/2020) Carvedilol 25 mg twice daily  -Current home readings: has BP cuff through [place] - at goal.  -Denies hypotensive/hypertensive symptoms -Educated on BP goals and benefits of medications for prevention of heart attack, stroke and kidney damage; Daily salt intake goal < 2300 mg; Exercise goal of 150 minutes per week; Importance of home blood pressure monitoring; Proper BP monitoring technique; Symptoms of hypotension and importance of maintaining adequate hydration; -Counseled to monitor BP at home twice daily, document, and provide log at future appointments -Counseled on diet and exercise extensively  Update 10/10/20 BP has been controlled No changes to any medications Recommended she continue to monitor and report any consistent elevations or symptoms to providers.   Asthma with COPD (Goal: control symptoms and prevent exacerbations) -Controlled -Current treatment  Ventolin HFA 2 puffs every 6 hours as needed for shortness of breath Arnuity Ellipta 100 mcg/act - 1 puff into lungs daily -Exacerbations requiring treatment in last 6 months: 0 -Patient reports consistent use of maintenance inhaler -Frequency of rescue inhaler use: rare -Counseled on Proper inhaler technique; -Recommended to  continue current medication    Diabetes (A1c goal <7%) -Controlled -Current medications: Jardiance 10 mg once daily Metformin 9024OX twice daily Trulicity 7.35HG once weekly -Current home glucose readings fasting glucose: 128, 130 - most recent  readings -Denies hypoglycemic/hyperglycemic symptoms -Current meal patterns:  Portion control, watches juice consumption, some crackers for snack. -Current exercise: home chair exercises for back -Reviewed side effects - no problems noted -Counseled on diet and exercise extensively Recommended to continue current medication  Update 10/10/20 Patient tolerating her Trulicity fine.  She has no complaints of the copay currently it is affordable.  Stated her sugars have been "better," however she was not at home to be able to tell me readings.  Has upcoming appointment with Dr. Jonni Sanger. Would recommend recheck A1c If A1c continues to be elevated, would recommend increase Jardiance to 23m daily.         Medication Assistance:  None required at this time, will discuss further at f/u [facility]  [facility] [address] [address]Phone: [phone]: [phone] Patient agrees to Care Plan and Follow-up.  Patient Goals/Self-Care Activities Over the next 365 days, patient will:  - take medications as prescribed target a minimum of 150 minutes of moderate intensity exercise weekly  Future Appointments  Date Time Provider [place] 10/20/2020 10:00 AM [facility]  10/25/2020  9:30 AM [place], AFrancesco Sor RD [place]  11/18/2020 11:30 AM ALeamon Arnt MD [facility] PEC  07/28/2021  2:30 PM [facility] HEALTH COACH [facility] PEC    [facility] [id number] [place] [place] [address]Phone: [phone]: [phone] Follow-up plan with Care Management Team: 6 months over phone  CBeverly Milch PharmD Clinical Pharmacist ([phone]

## 2020-10-20 ENCOUNTER — Ambulatory Visit: Payer: Medicare HMO

## 2020-10-20 DIAGNOSIS — E785 Hyperlipidemia, unspecified: Secondary | ICD-10-CM

## 2020-10-20 DIAGNOSIS — E1169 Type 2 diabetes mellitus with other specified complication: Secondary | ICD-10-CM

## 2020-10-20 DIAGNOSIS — E119 Type 2 diabetes mellitus without complications: Secondary | ICD-10-CM

## 2020-10-20 DIAGNOSIS — I10 Essential (primary) hypertension: Secondary | ICD-10-CM

## 2020-10-20 NOTE — Patient Instructions (Addendum)
Visit Information  PATIENT GOALS:  Goals Addressed             This Visit's Progress    (RNCM) Monitor and Manage My Blood Sugar-Diabetes Type 2   On track    Timeframe:  Long-Range Goal Priority:  Medium Start Date:   03/08/20                          Expected End Date:   03/07/21                  Follow Up Date 11/29/20    Continue to check blood sugars twice a day Enter blood sugar readings into daily log Take the blood sugar log to all doctor visits  Increase activity slowly as tolerated   Why is this important?   Checking your blood sugar at home helps to keep it from getting very high or very low.  Writing the results in a diary or log helps the doctor know how to care for you.  Your blood sugar log should have the time, date and the results.  Also, write down the amount of insulin or other medicine that you take.  Other information, like what you ate, exercise done and how you were feeling, will also be helpful.     Notes:   10/10/20 - patient stated glucose is improving     (RNCM) Track and Manage My Blood Pressure-Hypertension   On track    Timeframe:  Long-Range Goal Priority:  Medium Start Date:  03/08/20                           Expected End Date:   03/07/21                  Follow Up Date  11/29/20    Check blood pressure daily  Write blood pressure results in a log  Monitor yourself for hypotension and hypertension Follow low salt carbohydrate modified diabetic diet   Why is this important?   You won't feel high blood pressure, but it can still hurt your blood vessels.  High blood pressure can cause heart or kidney problems. It can also cause a stroke.  Making lifestyle changes like losing a little weight or eating less salt will help.  Checking your blood pressure at home and at different times of the day can help to control blood pressure.  If the doctor prescribes medicine remember to take it the way the doctor ordered.  Call the office if you cannot  afford the medicine or if there are questions about it.     Notes:       Hypoglycemia Hypoglycemia is when the sugar (glucose) level in your blood is too low. Low blood sugar can happen to people who have diabetes and people who do not have diabetes. Low blood sugar can happen quickly, and it can be an emergency. What are the causes? This condition happens most often in people who have diabetes. It may be caused by: Diabetes medicine. Not eating enough, or not eating often enough. Doing more physical activity. Drinking alcohol on an empty stomach. If you do not have diabetes, this condition may be caused by: A tumor in the pancreas. Not eating enough, or not eating for long periods at a time (fasting). A very bad infection or illness. Problems after having weight loss (bariatric) surgery. Kidney failure or liver failure.  Certain medicines. What increases the risk? This condition is more likely to develop in people who: Have diabetes and take medicines to lower their blood sugar. Abuse alcohol. Have a very bad illness. What are the signs or symptoms? Mild Hunger. Sweating and feeling clammy. Feeling dizzy or light-headed. Being sleepy or having trouble sleeping. Feeling like you may vomit (nauseous). A fast heartbeat. A headache. Blurry vision. Mood changes, such as: Being grouchy. Feeling worried or nervous (anxious). Tingling or loss of feeling (numbness) around your mouth, lips, or tongue. Moderate Confusion and poor judgment. Behavior changes. Weakness. Uneven heartbeat. Trouble with moving (coordination). Very low Very low blood sugar (severe hypoglycemia) is a medical emergency. It can cause: Fainting. Seizures. Loss of consciousness (coma). Death. How is this treated? Treating low blood sugar Low blood sugar is often treated by eating or drinking something that has sugar in it right away. The food or drink should contain 15 grams of a fast-acting carb  (carbohydrate). Options include: 4 oz (120 mL) of fruit juice. 4 oz (120 mL) of regular soda (not diet soda). A few pieces of hard candy. Check food labels to see how many pieces to eat for 15 grams. 1 Tbsp (15 mL) of sugar or honey. 4 glucose tablets. 1 tube of glucose gel. Treating low blood sugar if you have diabetes If you can think clearly and swallow safely, follow the 15:15 rule: Take 15 grams of a fast-acting carb. Talk with your doctor about how much you should take. Always keep a source of fast-acting carb with you, such as: Glucose tablets (take 4 tablets). A few pieces of hard candy. Check food labels to see how many pieces to eat for 15 grams. 4 oz (120 mL) of fruit juice. 4 oz (120 mL) of regular soda (not diet soda). 1 Tbsp (15 mL) of honey or sugar. 1 tube of glucose gel. Check your blood sugar 15 minutes after you take the carb. If your blood sugar is still at or below 70 mg/dL (3.9 mmol/L), take 15 grams of a carb again. If your blood sugar does not go above 70 mg/dL (3.9 mmol/L) after 3 tries, get help right away. After your blood sugar goes back to normal, eat a meal or a snack within 1 hour.  Treating very low blood sugar If your blood sugar is below 54 mg/dL (3 mmol/L), you have very low blood sugar, or severe hypoglycemia. This is an emergency. Get medical help right away. If you have very low blood sugar and you cannot eat or drink, you will need to be given a hormone called glucagon. A family member or friend should learn how to check your blood sugar and how to give you glucagon. Ask your doctor if you need to have an emergency glucagon kit at home. Very low blood sugar may also need to be treated in a hospital. Follow these instructions at home: General instructions Take over-the-counter and prescription medicines only as told by your doctor. Stay aware of your blood sugar as told by your doctor. If you drink alcohol: Limit how much you have to: 0-1 drink a  day for women who are not pregnant. 0-2 drinks a day for men. Know how much alcohol is in your drink. In the [place] one drink equals one 12 oz bottle of beer (355 mL), one 5 oz glass of wine (148 mL), or one 1 oz glass of hard liquor (44 mL). Be sure to eat food when you drink alcohol. Know that  your body absorbs alcohol quickly. This may lead to low blood sugar later. Be sure to keep checking your blood sugar. Keep all follow-up visits. If you have diabetes:  Always have a fast-acting carb (15 grams) with you to treat low blood sugar. Follow your diabetes care plan as told by your doctor. Make sure you: Know the symptoms of low blood sugar. Check your blood sugar as often as told. Always check it before and after exercise. Always check your blood sugar before you drive. Take your medicines as told. Follow your meal plan. Eat on time. Do not skip meals. Share your diabetes care plan with: Your work or school. People you live with. Carry a card or wear jewelry that says you have diabetes. Where to find more information American Diabetes Association: www.diabetes.org Contact a doctor if: You have trouble keeping your blood sugar in your target range. You have low blood sugar often. Get help right away if: You still have symptoms after you eat or drink something that contains 15 grams of fast-acting carb, and you cannot get your blood sugar above 70 mg/dL by following the 15:15 rule. Your blood sugar is below 54 mg/dL (3 mmol/L). You have a seizure. You faint. These symptoms may be an emergency. Get help right away. Call your local emergency services (911 in the [place] Do not wait to see if the symptoms will go away. Do not drive yourself to the hospital. Summary Hypoglycemia happens when the level of sugar (glucose) in your blood is too low. Low blood sugar can happen to people who have diabetes and people who do not have diabetes. Low blood sugar can happen quickly, and it can be an  emergency. Make sure you know the symptoms of low blood sugar and know how to treat it. Always keep a source of sugar (fast-acting carb) with you to treat low blood sugar. This information is not intended to replace advice given to you by your health care provider. Make sure you discuss any questions you have with your health care provider. Document Revised: 11/26/2019 Document Reviewed: 11/26/2019 Elsevier Patient Education  2022 [place].   Patient verbalizes understanding of instructions provided today and agrees to view in [place].   Telephone follow up appointment with care management team member scheduled for: 11/29/20 at 10 AM Peter Garter RN, [facility], CDE Care Management Coordinator [place] [phone], Mobile [phone]

## 2020-10-20 NOTE — Chronic Care Management (AMB) (Signed)
Chronic Care Management   CCM RN Visit Note  10/20/2020 Name: Stacy Campbell MRN: [id number] DOB: 03/12/1955  Subjective: Stacy Campbell is a 65 y.o. year old female who is a primary care patient of [name], MD. The care management team was consulted for assistance with disease management and care coordination needs.    Engaged with patient by telephone for follow up visit in response to provider referral for case management and/or care coordination services.   Consent to Services:  The patient was given information about Chronic Care Management services, agreed to services, and gave verbal consent prior to initiation of services.  Please see initial visit note for detailed documentation.   Patient agreed to services and verbal consent obtained.   Assessment: Review of patient past medical history, allergies, medications, health status, including review of consultants reports, laboratory and other test data, was performed as part of comprehensive evaluation and provision of chronic care management services.   SDOH (Social Determinants of Health) assessments and interventions performed:    CCM Care Plan  Allergies  Allergen Reactions   Hydrocodone Itching   Linalool    Methylisothiazolinone Other (See Comments)    Other reaction(s): Other (See Comments) Positive patch test Positive patch test    Propylene Glycol    Aspirin Itching and Other (See Comments)    Lower dose doesn't make her itch (she takes it every day)   Eggs Or Egg-Derived Products Nausea Only and Other (See Comments)    No reaction if in another food   Hydrocodone-Guaifenesin Rash    Outpatient Encounter Medications as of 10/20/2020  Medication Sig Note   albuterol (VENTOLIN HFA) 108 (90 Base) MCG/ACT inhaler INHALE 2 PUFFS INTO THE LUNGS EVERY 6 HOURS AS NEEDED FOR WHEEZING OR SHORTNESS OF BREATH    amLODipine (NORVASC) 10 MG tablet Take 1 tablet (10 mg total) by mouth daily.     ARIPiprazole (ABILIFY) 5 MG tablet Take 1 tablet (5 mg total) by mouth daily.    ARNUITY ELLIPTA 100 MCG/ACT AEPB INHALE 1 PUFF INTO THE LUNGS DAILY    benzonatate (TESSALON) 100 MG capsule Take 1 capsule (100 mg total) by mouth 2 (two) times daily as needed for cough.    busPIRone (BUSPAR) 5 MG tablet Take 5 mg by mouth 2 (two) times daily.    carvedilol (COREG) 25 MG tablet Take 1 tablet (25 mg total) by mouth 2 (two) times daily with a meal.    desvenlafaxine (PRISTIQ) 50 MG 24 hr tablet Take 50 mg by mouth daily.    doxepin (SINEQUAN) 25 MG capsule Take 25 mg by mouth 3 (three) times daily as needed.    Dulaglutide (TRULICITY) 0.81 KG/8.1EH SOPN Inject 0.75 mg into the skin once a week.    empagliflozin (JARDIANCE) 10 MG TABS tablet Take 1 tablet (10 mg total) by mouth daily.    EPINEPHrine 0.3 mg/0.3 mL IJ SOAJ injection Use as directed for severe allergic reaction    fluconazole (DIFLUCAN) 200 MG tablet Take 1 pill once a week for 12 weeks.    FLUoxetine (PROZAC) 20 MG capsule Take 1 capsule (20 mg total) by mouth daily.    glucose blood test strip Test blood sugar daily and record on blood sugar log    Lancets (ACCU-CHEK SOFT TOUCH) lancets Check blood sugar daily and record in log    lisinopril-hydrochlorothiazide (ZESTORETIC) 20-25 MG tablet Take 1 tablet by mouth daily. 03/22/2020: Resumed on 03/14/2020 per patient   meclizine (ANTIVERT)  25 MG tablet Take 1 tablet (25 mg total) by mouth 3 (three) times daily as needed for dizziness.    metFORMIN (GLUCOPHAGE-XR) 500 MG 24 hr tablet TAKE 1 TABLET(500 MG) BY MOUTH DAILY WITH BREAKFAST    mycophenolate (CELLCEPT) 500 MG tablet Take 1 tablet by mouth 2 (two) times daily. Written by derm    nystatin (MYCOSTATIN/NYSTOP) powder Apply topically.    nystatin cream (MYCOSTATIN) APPLY TOPICALLY TO THE AFFECTED AREA TWICE DAILY FOR 7 DAYS THEN AS NEEDED    Oxycodone HCl 10 MG TABS Take by mouth.    promethazine (PHENERGAN) 25 MG tablet take 1 tablet  by mouth every 8 hours if needed for nausea and vomiting    rosuvastatin (CRESTOR) 10 MG tablet Take 1 tablet (10 mg total) by mouth daily.    tiZANidine (ZANAFLEX) 4 MG tablet Take 4 mg by mouth 2 (two) times daily.    triamcinolone ointment (KENALOG) 0.1 % Apply 1 application topically 2 (two) times daily.    zolpidem (AMBIEN) 10 MG tablet Take 10 mg by mouth at bedtime.    No facility-administered encounter medications on file as of 10/20/2020.    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   Cirrhosis of liver ([place]) - h/o hep C 01/04/2020   Degenerative joint disease (DJD) of lumbar spine 01/04/2020   Monoclonal gammopathy of unknown significance (MGUS) 01/23/2019   Osteoporosis with current pathological fracture 01/21/2019   Controlled type 2 diabetes mellitus without complication, without long-term current use of insulin ([place]) 12/12/2018   Hyperlipidemia associated with type 2 diabetes mellitus ([place]) 12/12/2018   Seasonal and perennial allergic rhinoconjunctivitis 02/12/2018   History of hepatitis C 10/11/2017   History of atrial fibrillation 10/11/2017   Former smoker 10/11/2017   Dyshidrotic eczema 09/10/2017   GERD (gastroesophageal reflux disease) 02/15/2017   Psoriasis 07/10/2015   Asthma with COPD ([place]) 05/25/2013   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   Obesity with serious comorbidity 03/07/2006   Major depression, recurrent, chronic ([place]) 03/07/2006   Essential hypertension 03/07/2006   Chronic low back pain 03/07/2006   Insomnia 03/07/2006    Conditions to be addressed/monitored:HTN, HLD, and DMII  Care Plan : Diabetes Type 2 (Adult)  Updates made by Dimitri Ped, RN since 10/20/2020 12:00 AM     Problem: Patient will maintain Hgb A1C of below 7 within the next 90 days.   Priority: Medium     Long-Range Goal: Patient will maintain Hgb A1C of below 7 within the next 90 days   Start Date: 03/08/2020  Expected End Date:  01/06/2021  This Visit's Progress: On track  Recent Progress: On track  Priority: Medium  Note:   Objective:  Lab Results  Component Value Date   HGBA1C 8.3 (A) 08/17/2020   Lab Results  Component Value Date   CREATININE 1.29 (H) 05/06/2020   CREATININE 1.53 (H) 02/15/2020   CREATININE 0.80 01/04/2020  Current Barriers:  Knowledge Deficits related to basic Diabetes pathophysiology and self care/management.  Patient's current Hgb A1C level increased to 8.3.   Continues to monitor blood sugars twice a day.  Fasting blood sugar have been ranging from 101-147 and 72-121 later in the day.  Denies feeling low at 72 and denies any hypoglycemia.  Reports compliance with all medications.   Difficulties managing chronic conditions related to pain and anxiety.  Attends pain management clinic to manage chronic back pain. Reports pain limits activity and prevents her from spending  time with her family.  Patient states she continues to see therapist and psychiatrist monthly for her anxiety and depression.  States she received the chair exercise booklet and she has been doing the exercises  Case Manager Clinical Goal(s):  Patient will demonstrate improved adherence to prescribed treatment plan for diabetes self care/management as evidenced by: daily monitoring and recording of CBG,  adherence to ADA/ carb modified diet, adherence to prescribed medication regimen, contacting provider for new or worsened symptoms or questions Interventions:  Collaboration with [name], MD regarding development and update of comprehensive plan of care as evidenced by provider attestation and co-signature Inter-disciplinary care team collaboration (see longitudinal plan of care) Reviewed medications with patient and discussed importance of medication adherence Discussed plans with patient for ongoing care management follow up and provided patient with direct contact information for care management team Advised patient,  providing education and rationale, to check cbg twice a day and record, calling primary care provider for findings outside established parameters.   Reviewed current glucose readings and discussed ways to help reduce Encouraged to notify provider for sustained elevated blood sugars Reviewed how a 5-10% weight loss can effect her CBGs, Hemoglobin A1C and B/P Encouraged to discuss pain limiting effects with pain management provider Reinforced heart healthy carbohydrate modified diet/food options; sent education on diabetic diet/food choices-received and reviewed  Discussed formal diabetes and nutrition classes an patient agrees/interested-scheduled for 10/25/20 Encouraged to attend scheduled medical appointments, PCP 11/18/20 Sent [facility] Exercise Program Activity Booklet and encouraged patient to use- received booklet and reviewed with pt Reviewed fasting blood sugar goals of 80-130 and less than 180 1 1/2-2 hours after meals Patient Goals/Self-Care Activities: Continue to check blood sugars twice a day Enter blood sugar readings into daily log Take the blood sugar log to all doctor visits  Request [employer] to fax eye exam results to Dr. Jonni Sanger office Increase activity slowly as tolerated Follow Up Plan: Telephone follow up appointment with care management team member scheduled for: The care management team will reach out to the patient again over the next 45 business days.       Care Plan : Hypertension (Adult)  Updates made by Dimitri Ped, RN since 10/20/2020 12:00 AM     Problem: Hypertension (Hypertension)   Priority: Medium     Long-Range Goal: Hypertension Monitored   Start Date: 03/08/2020  Expected End Date: 01/07/2021  This Visit's Progress: On track  Recent Progress: On track  Priority: Medium  Note:   Current Barriers:  Knowledge Deficits related to basic understanding of hypertension pathophysiology and self care management.  Patient reporting latest BP ranges of  119-137/70-80's. Denies any light headiness or dizziness at this time.  Denies shortness of breath or swelling.  Denies any episodes of hypo hypertension,    Difficulties maintaining food in the home.  Reports having les trouble affording food.  States she is using her  DSNP benefit for healthy food now Nurse Case Manager Clinical Goal(s):  Over the next 90 days, patient will verbalize understanding of plan for hypertension management Over the next 90 days, patient will demonstrate improved adherence to prescribed treatment plan for hypertension as evidenced by taking all medications as prescribed, monitoring and recording blood pressure as directed, adhering to low sodium/DASH diet Interventions:  Collaboration with [name], MD regarding development and update of comprehensive plan of care as evidenced by provider attestation and co-signature Inter-disciplinary care team collaboration (see longitudinal plan of care) Evaluation of current  treatment plan related to hypertension self management and patient's adherence to plan as established by provider. Reviewed medications with patient and discussed importance of compliance Encouraged patient to contact CCM Pharmacist or RNCM for any questions or concerns Discussed plans with patient for ongoing care management follow up and provided patient with direct contact information for care management team Reinforced to monitor blood pressure daily and record, calling PCP for findings outside established parameters.  Reviewed signs and symptoms of hypotension and hypertension; encouraged patient to review previous education sent Verified patient has list of food banks and encouraged patient to review/contact list of food banks provided by [facility] Encouraged her to continue to use Calendar Booklet to log blood pressure readings for provider review Encouraged low salt carbohydrate modified diabetic diet, sent education on DASH  diet Encouraged to continue with therapy and discussed not stopping depression medication Discussed increasing activity as tolerated Discussed/confirmed healthy food $75 a month allowance from [employer] benefit and confirmed she has over the counter catalog-now using Patient Goals/Self-Care Activities:  Over the next 45 days, patient will: Continue to check blood sugars twice a day Enter blood sugar readings into daily log Take the blood sugar log to all doctor visits  Increase activity slowly as tolerated Follow Up Plan: The care management team will reach out to the patient again over the next 45 business days.         Plan:Telephone follow up appointment with care management team member scheduled for:  11/29/20 and The patient has been provided with contact information for the care management team and has been advised to call with any health related questions or concerns.  Peter Garter RN, Jackquline Denmark, CDE Care Management Coordinator [place] [phone], Mobile [phone]

## 2020-10-21 ENCOUNTER — Other Ambulatory Visit: Payer: Self-pay | Admitting: Family Medicine

## 2020-10-25 ENCOUNTER — Encounter: Payer: Self-pay | Admitting: Skilled Nursing Facility1

## 2020-10-25 ENCOUNTER — Encounter: Payer: Medicare HMO | Attending: Family Medicine | Admitting: Skilled Nursing Facility1

## 2020-10-25 ENCOUNTER — Other Ambulatory Visit: Payer: Self-pay

## 2020-10-25 DIAGNOSIS — E119 Type 2 diabetes mellitus without complications: Secondary | ICD-10-CM | POA: Insufficient documentation

## 2020-10-25 NOTE — Progress Notes (Signed)
Diabetes Self-Management Education  Visit Type: First/Initial   10/25/2020  Stacy Campbell, identified by name and date of birth, is a 65 y.o. female with a diagnosis of Diabetes: Type 2.   ASSESSMENT  Height 5\' 4"  (1.626 m), weight 231 lb 12.8 oz (105.1 kg). Body mass index is 39.79 kg/m.  Pt states she is allergic to eggs but can eat them in baked goods.  Pt states she does take: jardiance, trulictiy and metformin  Pt states she does have cirrhosis of the liver.  Pt states she quit smoking about 10-11 years ago.  Pt states she does struggle with depression but does talk therapy and works with a psychiatrist.   Pt states she checks her blood sugars about 2 times a day: fasting 89, later in the evening: 121.  Pt state she takes good care of her gums and her dentures.   Pt states at night she has to have a pillow between her legs and has restless legs. Pt states she does struggle with insomnia.   Pt states she does arm chair exercises 3 days a  week for 30 minutes.   Goals: -be sure to tell your doctor about your night time restless legs   Diabetes Self-Management Education - 10/25/20 0912       Visit Information   Visit Type First/Initial      Initial Visit   Diabetes Type Type 2    Are you currently following a meal plan? No    Are you taking your medications as prescribed? Yes    Date Diagnosed 2021      Health Coping   How would you rate your overall health? Fair      Psychosocial Assessment   Patient Belief/Attitude about Diabetes Motivated to manage diabetes    Self-care barriers None    Self-management support Friends    Patient Concerns Nutrition/Meal planning;Weight Control    Special Needs None    Learning Readiness Contemplating    How often do you need to have someone help you when you read instructions, pamphlets, or other written materials from your doctor or pharmacy? 1 - Never      Pre-Education Assessment   Patient understands the  diabetes disease and treatment process. Needs Instruction    Patient understands incorporating nutritional management into lifestyle. Needs Instruction    Patient undertands incorporating physical activity into lifestyle. Needs Instruction    Patient understands using medications safely. Needs Instruction    Patient understands monitoring blood glucose, interpreting and using results Needs Instruction    Patient understands prevention, detection, and treatment of acute complications. Needs Instruction    Patient understands prevention, detection, and treatment of chronic complications. Needs Instruction    Patient understands how to develop strategies to address psychosocial issues. Needs Instruction    Patient understands how to develop strategies to promote health/change behavior. Needs Instruction      Complications   Last HgB A1C per patient/outside source 8.3 %    How often do you check your blood sugar? 1-2 times/day    Fasting Blood glucose range (mg/dL) 70-129    Postprandial Blood glucose range (mg/dL) 70-129    Number of hypoglycemic episodes per month 0    Number of hyperglycemic episodes per week 0    Have you had a dilated eye exam in the past 12 months? Yes    Have you had a dental exam in the past 12 months? No    Are you checking your feet? Yes  How many days per week are you checking your feet? 3      Dietary Intake   Breakfast 10: 2 bacon + white toast    Snack (morning) chips    Lunch white bread sadnwich + lunch meat + lettuce + tomato    Snack (afternoon) apple or grapes    Dinner hamburger or salad    Snack (evening) chocolate candy    Beverage(s) orange juice, soda, water      Exercise   Exercise Type Light (walking / raking leaves);ADL's    How many days per week to you exercise? 3    How many minutes per day do you exercise? 30    Total minutes per week of exercise 90      Individualized Goals (developed by patient)   Nutrition General guidelines for  healthy choices and portions discussed;Follow meal plan discussed    Physical Activity Exercise 5-7 days per week;15 minutes per day;30 minutes per day    Medications take my medication as prescribed    Monitoring  test my blood glucose as discussed;test blood glucose pre and post meals as discussed      Post-Education Assessment   Patient understands the diabetes disease and treatment process. Demonstrates understanding / competency    Patient understands incorporating nutritional management into lifestyle. Demonstrates understanding / competency    Patient undertands incorporating physical activity into lifestyle. Demonstrates understanding / competency    Patient understands using medications safely. Demonstrates understanding / competency    Patient understands monitoring blood glucose, interpreting and using results Demonstrates understanding / competency    Patient understands prevention, detection, and treatment of acute complications. Demonstrates understanding / competency    Patient understands prevention, detection, and treatment of chronic complications. Demonstrates understanding / competency    Patient understands how to develop strategies to address psychosocial issues. Demonstrates understanding / competency    Patient understands how to develop strategies to promote health/change behavior. Demonstrates understanding / competency      Outcomes   Expected Outcomes Demonstrated interest in learning. Expect positive outcomes    Future DMSE 4-6 wks    Program Status Completed             Individualized Plan for Diabetes Self-Management Training:   Learning Objective:  Patient will have a greater understanding of diabetes self-management. Patient education plan is to attend individual and/or group sessions per assessed needs and concerns.   Expected Outcomes:  Demonstrated interest in learning. Expect positive outcomes  Education material provided: ADA - How to Thrive: A  Guide for Your Journey with Diabetes, Food label handouts, Meal plan card, My Plate, and Snack sheet  If problems or questions, patient to contact team via:  Phone and Email  Future DSME appointment: 4-6 wks

## 2020-10-31 ENCOUNTER — Other Ambulatory Visit: Payer: Self-pay | Admitting: Family Medicine

## 2020-10-31 DIAGNOSIS — J449 Chronic obstructive pulmonary disease, unspecified: Secondary | ICD-10-CM

## 2020-11-07 DIAGNOSIS — E119 Type 2 diabetes mellitus without complications: Secondary | ICD-10-CM

## 2020-11-07 DIAGNOSIS — I10 Essential (primary) hypertension: Secondary | ICD-10-CM | POA: Diagnosis not present

## 2020-11-07 DIAGNOSIS — E785 Hyperlipidemia, unspecified: Secondary | ICD-10-CM | POA: Diagnosis not present

## 2020-11-07 DIAGNOSIS — E1169 Type 2 diabetes mellitus with other specified complication: Secondary | ICD-10-CM | POA: Diagnosis not present

## 2020-11-18 ENCOUNTER — Ambulatory Visit (INDEPENDENT_AMBULATORY_CARE_PROVIDER_SITE_OTHER): Payer: Medicare HMO | Admitting: Family Medicine

## 2020-11-18 ENCOUNTER — Other Ambulatory Visit: Payer: Self-pay

## 2020-11-18 ENCOUNTER — Encounter: Payer: Self-pay | Admitting: Family Medicine

## 2020-11-18 VITALS — BP 124/70 | HR 88 | Temp 98.7°F | Ht 64.0 in | Wt 225.0 lb

## 2020-11-18 DIAGNOSIS — E1169 Type 2 diabetes mellitus with other specified complication: Secondary | ICD-10-CM

## 2020-11-18 DIAGNOSIS — I10 Essential (primary) hypertension: Secondary | ICD-10-CM

## 2020-11-18 DIAGNOSIS — E785 Hyperlipidemia, unspecified: Secondary | ICD-10-CM

## 2020-11-18 DIAGNOSIS — H1013 Acute atopic conjunctivitis, bilateral: Secondary | ICD-10-CM

## 2020-11-18 DIAGNOSIS — G729 Myopathy, unspecified: Secondary | ICD-10-CM | POA: Diagnosis not present

## 2020-11-18 DIAGNOSIS — Z23 Encounter for immunization: Secondary | ICD-10-CM

## 2020-11-18 DIAGNOSIS — E119 Type 2 diabetes mellitus without complications: Secondary | ICD-10-CM

## 2020-11-18 DIAGNOSIS — F339 Major depressive disorder, recurrent, unspecified: Secondary | ICD-10-CM

## 2020-11-18 DIAGNOSIS — L409 Psoriasis, unspecified: Secondary | ICD-10-CM

## 2020-11-18 LAB — POCT GLYCOSYLATED HEMOGLOBIN (HGB A1C): Hemoglobin A1C: 6.6 % — AB (ref 4.0–5.6)

## 2020-11-18 MED ORDER — OLOPATADINE HCL 0.1 % OP SOLN: 1.0000 [drp] | Freq: Two times a day (BID) | OPHTHALMIC | 5 refills | Status: DC

## 2020-11-18 NOTE — Patient Instructions (Signed)
Please return in 3 months for diabetes follow up   If you have any questions or concerns, please don't hesitate to send me a message via MyChart or call the office at [phone]. Thank you for visiting with us today! It's our pleasure caring for you.  

## 2020-11-18 NOTE — Progress Notes (Signed)
Subjective  CC:  Chief Complaint  Patient presents with   Diabetes   Hypertension   Hyperlipidemia   Depression   Seasonal Allergies    Watery eyes, itchy. Started with season change, failed with Zrytec and Singulair    HPI: Stacy Campbell is a 65 y.o. female who presents to the office today for follow up of diabetes and problems listed above in the chief complaint.  Diabetes follow up: Her diabetic control is reported as Improved.  25-month follow-up for uncontrolled diabetes.  We added Trulicity 5.73 mg weekly.  She continues on Jardiance 10 mg daily and metformin.  Her sugars are improving.  Her diet is improving.  Her weight is down 8 pounds.  She is feeling better.  Energy is better.  She is tolerating all of her medications without side effects. Hypertension: Continues on ACE, diuretic, Coreg, calcium channel blocker for blood pressure control.  Fortunately, these are working well.  She denies chest pain or lower extremity edema.  No palpitations. Hyperlipidemia on Crestor 10 mg nightly.  However she is complaining of leg pain at night.  Aching pain.  Not joint related.  No calf tenderness. Also complaining of seasonal allergies with itchy draining eyes.  She has been on Singulair and Zyrtec.  However her eyes symptoms are bothersome.  Has chronic allergies and asthma.  Asthma is not currently affected. Depression on antidepressants.  Fortunately this has stabilized.  Continuing to care for her daughter who has been ill.  Handling stressors better now. Psoriasis and eczema are active.  She does see dermatology. Eligible for flu shot and Prevnar 20 today.  Wt Readings from Last 3 Encounters:  11/18/20 225 lb (102.1 kg)  10/25/20 231 lb 12.8 oz (105.1 kg)  08/17/20 233 lb (105.7 kg)    BP Readings from Last 3 Encounters:  11/18/20 124/70  08/17/20 116/60  05/06/20 136/78    Assessment  1. Controlled type 2 diabetes mellitus without complication, without long-term  current use of insulin ([place])   2. Essential hypertension   3. Allergic conjunctivitis of both eyes   4. Myopathy   5. Hyperlipidemia associated with type 2 diabetes mellitus ([place])   6. Psoriasis   7. Need for immunization against influenza   8. Immunization due   9. Major depression, recurrent, chronic ([place])      Plan  Diabetes is currently very well controlled.  Improved control.  Continue Trulicity 2.20 mg weekly, Jardiance 10 mg daily and metformin twice daily.  Continue diet changes.  Continue weight loss.  Flu shot and Prevnar updated today.  She has eye exam scheduled.  Feeling better overall. Hypertension is well controlled.  Continue medications as listed above. Hyperlipidemia on Crestor however having myopathy symptoms.  Recommend holding for 2 to 4 weeks and then retrial, titrate up from 1 weekly to 3 times weekly.  She will follow-up with me if these things are not helpful. Depression is controlled, fortunately.  Has multiple medications for this.  High risk medications. Psoriasis: Eczema: Steroid creams Allergic conjunctivitis: Start Patanol twice daily continue allergy medications  Follow up: Return in about 3 months (around 02/18/2021) for follow up of diabetes and hypertension.. Orders Placed This Encounter  Procedures   Flu Vaccine QUAD High Dose(Fluad)   Pneumococcal conjugate vaccine 20-valent (Prevnar 20)   POCT HgB A1C   Meds ordered this encounter  Medications   olopatadine (PATANOL) 0.1 % ophthalmic solution    Sig: Place 1 drop into both eyes 2 (  two) times daily.    Dispense:  5 mL    Refill:  5      Immunization History  Administered Date(s) Administered   Fluad Quad(high Dose 65+) 11/18/2020   Hepatitis B, adult 10/12/2014, 11/09/2014, 04/11/2015   Influenza Split 11/14/2010, 10/09/2011   Influenza Whole 10/13/2007, 01/10/2009, 11/22/2009   Influenza,inj,Quad PF,6+ Mos 10/10/2012, 10/08/2013, 10/15/2014, 09/29/2015, 11/06/2016, 09/10/2017, 10/15/2018,  09/30/2019   PFIZER(Purple Top)SARS-COV-2 Vaccination 03/20/2019, 04/15/2019, 10/15/2019   PNEUMOCOCCAL CONJUGATE-20 11/18/2020   Pneumococcal Polysaccharide-23 01/10/2009   Td 01/09/1999, 01/10/2009    Diabetes Related Lab Review: Lab Results  Component Value Date   HGBA1C 6.6 (A) 11/18/2020   HGBA1C 8.3 (A) 08/17/2020   HGBA1C 6.9 (A) 05/06/2020    Lab Results  Component Value Date   MICROALBUR <0.7 01/04/2020   Lab Results  Component Value Date   CREATININE 1.29 (H) 05/06/2020   BUN 30 (H) 05/06/2020   NA 137 05/06/2020   K 5.0 05/06/2020   CL 101 05/06/2020   CO2 26 05/06/2020   Lab Results  Component Value Date   CHOL 140 02/15/2020   CHOL 174 01/04/2020   CHOL 178 12/11/2018   Lab Results  Component Value Date   HDL 37.50 (L) 02/15/2020   HDL 38.10 (L) 01/04/2020   HDL 32 (L) 12/11/2018   Lab Results  Component Value Date   LDLCALC 73 02/15/2020   LDLCALC 104 (H) 01/04/2020   LDLCALC 111 (H) 12/11/2018   Lab Results  Component Value Date   TRIG 148.0 02/15/2020   TRIG 159.0 (H) 01/04/2020   TRIG 196 (H) 12/11/2018   Lab Results  Component Value Date   CHOLHDL 4 02/15/2020   CHOLHDL 5 01/04/2020   CHOLHDL 5.6 (H) 12/11/2018   No results found for: LDLDIRECT The 10-year ASCVD risk score (Arnett DK, et al., 2019) is: 15.4%   Values used to calculate the score:     Age: 61 years     Sex: Female     Is Non-Hispanic African American: Yes     Diabetic: Yes     Tobacco smoker: No     Systolic Blood Pressure: 935 mmHg     Is BP treated: Yes     HDL Cholesterol: 37.5 mg/dL     Total Cholesterol: 140 mg/dL I have reviewed the PMH, Fam and Soc history. Patient Active Problem List   Diagnosis Date Noted   Cirrhosis of liver ([place]) - h/o hep C 01/04/2020    Priority: High   Monoclonal gammopathy of unknown significance (MGUS) 01/23/2019    Priority: High   Osteoporosis with current pathological fracture 01/21/2019    Priority: High    History of  chronic pred for urticaria:  Advised to start alendronate jan 2021, stop jan 2026    Controlled type 2 diabetes mellitus without complication, without long-term current use of insulin ([place]) 12/12/2018    Priority: High    Diagnosed 2020    Hyperlipidemia associated with type 2 diabetes mellitus ([place]) 12/12/2018    Priority: High    LDL goal of 70 given diabetes    Asthma with COPD ([place]) 05/25/2013    Priority: High   Obesity with serious comorbidity 03/07/2006    Priority: High   Major depression, recurrent, chronic ([place]) 03/07/2006    Priority: High    Sees psychiatry: Sharon Seller, [place]; [facility] Psychotherapy with Zella Ball, PsyD    Essential hypertension 03/07/2006    Priority: High   Chronic low back pain  03/07/2006    Priority: High   Insomnia 03/07/2006    Priority: High    On chronic Ambien (started by previous provider).     Degenerative joint disease (DJD) of lumbar spine 01/04/2020    Priority: Medium    History of hepatitis C 10/11/2017    Priority: Medium     S/P Treatment with sofosbuvir and simeprivir.  Followed by hep clinic.  Undetectable viral load - Cured.    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 Her smoking use included cigarettes. She has a 9.25 pack-year smoking history. She has never used smokeless tobacco. She reports that she does not drink alcohol and does not use drugs.  Review of Systems: Ophthalmic: negative for eye pain, loss of vision or double  vision Cardiovascular: negative for chest pain Respiratory: negative for SOB or persistent cough Gastrointestinal: negative for abdominal pain Genitourinary: negative for dysuria or gross hematuria MSK: negative for foot lesions Neurologic: negative for weakness or gait disturbance  Objective  Vitals: BP 124/70   Pulse 88   Temp 98.7 F (37.1 C) (Temporal)   Ht 5\' 4"  (1.626 m)   Wt 225 lb (102.1 kg)   SpO2 99%   BMI 38.62 kg/m  General: well appearing, no acute distress  Psych:  Alert and oriented, normal mood and affect HEENT:  Normocephalic, atraumatic, moist mucous membranes, supple neck  Cardiovascular:  Nl S1 and S2, RRR without murmur, gallop or rub. no edema Respiratory:  Good breath sounds bilaterally, CTAB with normal effort, no rales Gastrointestinal: normal BS, soft, nontender Skin:  Warm, eczematous rash on legs/psoriatic patches Foot exam: no erythema, pallor, or cyanosis visible nl proprioception and sensation to monofilament testing bilaterally, +2 distal pulses bilaterally   Diabetic education: ongoing education regarding chronic disease management for diabetes was given today. We continue to reinforce the ABC's of diabetic management: A1c (<7 or 8 dependent upon patient), tight blood pressure control, and cholesterol management with goal LDL < 100 minimally. We discuss diet strategies, exercise recommendations, medication options and possible side effects. At each visit, we review recommended immunizations and preventive care recommendations for diabetics and stress that good diabetic control can prevent other problems. See below for this patient's data.   Commons side effects, risks, benefits, and alternatives for medications and treatment plan prescribed today were discussed, and the patient expressed understanding of the given instructions. Patient is instructed to call or message via MyChart if he/she has any questions or concerns regarding our treatment plan. No  barriers to understanding were identified. We discussed Red Flag symptoms and signs in detail. Patient expressed understanding regarding what to do in case of urgent or emergency type symptoms.  Medication list was reconciled, printed and provided to the patient in AVS. Patient instructions and summary information was reviewed with the patient as documented in the AVS. This note was prepared with assistance of Dragon voice recognition software. Occasional wrong-word or sound-a-like substitutions may have occurred due  to the inherent limitations of voice recognition software  This visit occurred during the SARS-CoV-2 public health emergency.  Safety protocols were in place, including screening questions prior to the visit, additional usage of staff PPE, and extensive cleaning of exam room while observing appropriate contact time as indicated for disinfecting solutions.

## 2020-11-23 ENCOUNTER — Other Ambulatory Visit: Payer: Self-pay | Admitting: Family Medicine

## 2020-11-23 DIAGNOSIS — E119 Type 2 diabetes mellitus without complications: Secondary | ICD-10-CM

## 2020-11-29 ENCOUNTER — Ambulatory Visit (INDEPENDENT_AMBULATORY_CARE_PROVIDER_SITE_OTHER): Payer: Medicare HMO | Admitting: *Deleted

## 2020-11-29 DIAGNOSIS — E119 Type 2 diabetes mellitus without complications: Secondary | ICD-10-CM

## 2020-11-29 DIAGNOSIS — I10 Essential (primary) hypertension: Secondary | ICD-10-CM

## 2020-11-29 NOTE — Patient Instructions (Signed)
Visit Information  Thank you for taking time to visit with me today. Please don't hesitate to contact me if I can be of assistance to you before our next scheduled telephone appointment.  Following are the goals we discussed today:  Take medications as prescribed   Attend all scheduled provider appointments check blood sugar at prescribed times: twice daily take the blood sugar log to all doctor visits drink 6 to 8 glasses of water each day check blood pressure 3 times per week write blood pressure results in a log or diary learn about high blood pressure take blood pressure log to all doctor appointments call doctor for signs and symptoms of high blood pressure develop an action plan for high blood pressure keep all doctor appointments take medications for blood pressure exactly as prescribed  Our next appointment is by telephone on 01/03/21 at 1000  Please call the care guide team at [phone] if you need to cancel or reschedule your appointment.   Please call the Suicide and Crisis Lifeline: 988 call the [place] National Suicide Prevention Lifeline: 709-282-1371 or TTY: (781) 105-4904 TTY 802 686 5704) to talk to a trained counselor call 1-800-273-TALK (toll free, 24 hour hotline) go to [facility] [address], [place] [phone]) call 911 if you are experiencing a Mental Health or [place] or need someone to talk to.  Patient verbalizes understanding of instructions provided today and agrees to view in [place].   Hubert Azure RN, MSN RN Care Management Coordinator  [facility] [phone] [email or web address]

## 2020-11-29 NOTE — Chronic Care Management (AMB) (Signed)
Chronic Care Management   CCM RN Visit Note  11/29/2020 Name: Stacy Campbell MRN: [id number] DOB: 07-10-1955  Subjective: Stacy Campbell is a 65 y.o. year old female who is a primary care patient of [name], MD. The care management team was consulted for assistance with disease management and care coordination needs.    Engaged with patient by telephone for follow up visit in response to provider referral for case management and/or care coordination services.   Consent to Services:  The patient was given information about Chronic Care Management services, agreed to services, and gave verbal consent prior to initiation of services.  Please see initial visit note for detailed documentation.   Patient agreed to services and verbal consent obtained.   Assessment: Review of patient past medical history, allergies, medications, health status, including review of consultants reports, laboratory and other test data, was performed as part of comprehensive evaluation and provision of chronic care management services.   SDOH (Social Determinants of Health) assessments and interventions performed:    CCM Care Plan  Allergies  Allergen Reactions   Hydrocodone Itching   Linalool    Methylisothiazolinone Other (See Comments)    Other reaction(s): Other (See Comments) Positive patch test Positive patch test    Propylene Glycol    Aspirin Itching and Other (See Comments)    Lower dose doesn't make her itch (she takes it every day)   Eggs Or Egg-Derived Products Nausea Only and Other (See Comments)    No reaction if in another food   Hydrocodone-Guaifenesin Rash    Outpatient Encounter Medications as of 11/29/2020  Medication Sig Note   albuterol (VENTOLIN HFA) 108 (90 Base) MCG/ACT inhaler INHALE 2 PUFFS INTO THE LUNGS EVERY 6 HOURS AS NEEDED FOR WHEEZING OR SHORTNESS OF BREATH    amLODipine (NORVASC) 10 MG tablet Take 1 tablet (10 mg total) by mouth daily.     ARIPiprazole (ABILIFY) 5 MG tablet Take 1 tablet (5 mg total) by mouth daily.    ARNUITY ELLIPTA 100 MCG/ACT AEPB INHALE 1 PUFF INTO THE LUNGS DAILY    benzonatate (TESSALON) 100 MG capsule Take 1 capsule (100 mg total) by mouth 2 (two) times daily as needed for cough.    busPIRone (BUSPAR) 5 MG tablet Take 5 mg by mouth 2 (two) times daily.    carvedilol (COREG) 25 MG tablet Take 1 tablet (25 mg total) by mouth 2 (two) times daily with a meal.    desvenlafaxine (PRISTIQ) 50 MG 24 hr tablet Take 50 mg by mouth daily.    doxepin (SINEQUAN) 25 MG capsule Take 25 mg by mouth 3 (three) times daily as needed.    Dulaglutide (TRULICITY) 1.19 ER/7.4YC SOPN Inject 0.75 mg into the skin once a week.    empagliflozin (JARDIANCE) 10 MG TABS tablet Take 1 tablet (10 mg total) by mouth daily.    EPINEPHrine 0.3 mg/0.3 mL IJ SOAJ injection Use as directed for severe allergic reaction    fluconazole (DIFLUCAN) 200 MG tablet Take 1 pill once a week for 12 weeks.    FLUoxetine (PROZAC) 20 MG capsule Take 1 capsule (20 mg total) by mouth daily.    glucose blood test strip Test blood sugar daily and record on blood sugar log    Lancets (ACCU-CHEK SOFT TOUCH) lancets Check blood sugar daily and record in log    lisinopril-hydrochlorothiazide (ZESTORETIC) 20-25 MG tablet Take 1 tablet by mouth daily. 03/22/2020: Resumed on 03/14/2020 per patient   meclizine (ANTIVERT)  25 MG tablet Take 1 tablet (25 mg total) by mouth 3 (three) times daily as needed for dizziness.    metFORMIN (GLUCOPHAGE-XR) 500 MG 24 hr tablet TAKE 1 TABLET(500 MG) BY MOUTH DAILY WITH BREAKFAST    mycophenolate (CELLCEPT) 500 MG tablet Take 1 tablet by mouth 2 (two) times daily. Written by derm    nystatin (MYCOSTATIN/NYSTOP) powder Apply topically. (Patient not taking: Reported on 11/18/2020)    nystatin cream (MYCOSTATIN) APPLY TOPICALLY TO THE AFFECTED AREA TWICE DAILY FOR 7 DAYS THEN AS NEEDED    olopatadine (PATANOL) 0.1 % ophthalmic solution  Place 1 drop into both eyes 2 (two) times daily.    Oxycodone HCl 10 MG TABS Take by mouth.    promethazine (PHENERGAN) 25 MG tablet take 1 tablet by mouth every 8 hours if needed for nausea and vomiting    rosuvastatin (CRESTOR) 10 MG tablet Take 1 tablet (10 mg total) by mouth daily.    tiZANidine (ZANAFLEX) 4 MG tablet Take 4 mg by mouth 2 (two) times daily.    triamcinolone ointment (KENALOG) 0.1 % Apply 1 application topically 2 (two) times daily.    zolpidem (AMBIEN) 10 MG tablet Take 10 mg by mouth at bedtime.    No facility-administered encounter medications on file as of 11/29/2020.    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 01/04/2020   Degenerative joint disease (DJD) of lumbar spine 01/04/2020   Monoclonal gammopathy of unknown significance (MGUS) 01/23/2019   Osteoporosis with current pathological fracture 01/21/2019   Controlled type 2 diabetes mellitus without complication, without long-term current use of insulin ([place]) 12/12/2018   Hyperlipidemia associated with type 2 diabetes mellitus ([place]) 12/12/2018   Seasonal and perennial allergic rhinoconjunctivitis 02/12/2018   History of hepatitis C 10/11/2017   History of atrial fibrillation 10/11/2017   Former smoker 10/11/2017   Dyshidrotic eczema 09/10/2017   GERD (gastroesophageal reflux disease) 02/15/2017   Psoriasis 07/10/2015   Asthma with COPD ([place]) 05/25/2013   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   Obesity with serious comorbidity 03/07/2006   Major depression, recurrent, chronic ([place]) 03/07/2006   Essential hypertension 03/07/2006   Chronic low back pain 03/07/2006   Insomnia 03/07/2006    Conditions to be addressed/monitored:HTN and DMII  Care Plan : [place] (Adult)  Updates made by Leona Singleton, RN since 11/29/2020 12:00 AM     Problem: Knowledge deficit related to self care management of chronic medical  conditions   Priority: Medium     Long-Range Goal: Patient will work wth CCM team to gain knowledge for management of chronic medical conditions   Start Date: 11/29/2020  Expected End Date: 11/29/2021  Priority: Medium  Note:   Current Barriers:  Knowledge Deficits related to plan of care for management of HTN and DMII  Chronic Disease Management support and education needs related to HTN and DMII  Patient reporting she is doing well.  Attending Diabetes and Nutrition classes.  Happy with her with her weight loss and reduction in A1C.  Fasting blood  sugar this morning was 119 with recent ranges of 120-140's and 120's in afternoons.  Reports blood pressures have ranged 120-130/70's  RNCM Clinical Goal(s):  Patient will verbalize basic understanding of HTN and DMII disease process and self health management plan as evidenced by monitoring blood pressure and blood sugars logging for provider review take all medications exactly  as prescribed and will call provider for medication related questions as evidenced by medication compliance    demonstrate ongoing health management independence as evidenced by decreasing Hgb A1C by 0.3  points and continued weight loss        through collaboration with RN Care manager, provider, and care team.   Interventions: 1:1 collaboration with primary care provider regarding development and update of comprehensive plan of care as evidenced by provider attestation and co-signature Inter-disciplinary care team collaboration (see longitudinal plan of care) Evaluation of current treatment plan related to  self management and patient's adherence to plan as established by provider   Diabetes:  (Status: Goal on Track (progressing): YES.) Long Term Goal   Lab Results  Component Value Date   HGBA1C 6.6 (A) 11/18/2020  Assessed patient's understanding of A1c goal: <7% Provided education to patient about basic DM disease process; Reviewed medications with patient and  discussed importance of medication adherence;        Counseled on importance of regular laboratory monitoring as prescribed;        Discussed plans with patient for ongoing care management follow up and provided patient with direct contact information for care management team;      Provided patient with written educational materials related to hypo and hyperglycemia and importance of correct treatment;       Advised patient, providing education and rationale, to check cbg twice daily and record        call provider for findings outside established parameters;       Review of patient status, including review of consultants reports, relevant laboratory and other test results, and medications completed;       Congratulated on A1C reduction and discussed ways to maintain   Hypertension: (Status: Goal on Track (progressing): YES.)  Long Term Goal Last practice recorded BP readings:  BP Readings from Last 3 Encounters:  11/18/20 124/70  08/17/20 116/60  05/06/20 136/78  Most recent eGFR/CrCl: No results found for: EGFR  No components found for: CRCL  Evaluation of current treatment plan related to hypertension self management and patient's adherence to plan as established by provider;   Provided education to patient re: stroke prevention, s/s of heart attack and stroke; Reviewed prescribed diet low sat carb modified Reviewed medications with patient and discussed importance of compliance;  Discussed plans with patient for ongoing care management follow up and provided patient with direct contact information for care management team; Advised patient, providing education and rationale, to monitor blood pressure daily and record, calling PCP for findings outside established parameters;  Discussed complications of poorly controlled blood pressure such as heart disease, stroke, circulatory complications, vision complications, kidney impairment, sexual dysfunction;   Congratulated on current weight  loss  Patient Goals/Self-Care Activities: Take medications as prescribed   Attend all scheduled provider appointments check blood sugar at prescribed times: twice daily take the blood sugar log to all doctor visits drink 6 to 8 glasses of water each day check blood pressure 3 times per week write blood pressure results in a log or diary learn about high blood pressure take blood pressure log to all doctor appointments call doctor for signs and symptoms of high blood pressure develop an action plan for high blood pressure keep all doctor appointments take medications for blood pressure exactly as prescribed       Plan:The care management team will reach out to the patient again over the next 45 days.  Hubert Azure RN, MSN RN Care Management  Coordinator  [place] [phone] [email or web address]

## 2020-12-07 DIAGNOSIS — Z7984 Long term (current) use of oral hypoglycemic drugs: Secondary | ICD-10-CM

## 2020-12-07 DIAGNOSIS — I10 Essential (primary) hypertension: Secondary | ICD-10-CM

## 2020-12-07 DIAGNOSIS — E119 Type 2 diabetes mellitus without complications: Secondary | ICD-10-CM | POA: Diagnosis not present

## 2020-12-14 ENCOUNTER — Telehealth: Payer: Self-pay | Admitting: Pharmacist

## 2020-12-14 NOTE — Chronic Care Management (AMB) (Signed)
Chronic Care Management Pharmacy Assistant   Name: Stacy Campbell  MRN: [id number] DOB: 1955/03/26   Reason for Encounter: Diabetes Adherence Call    Recent office visits:  11/18/2020 OV (PCP) [name], MD; Hyperlipidemia on Crestor however having myopathy symptoms.  Recommend holding for 2 to 4 weeks and then retrial, titrate up from 1 weekly to 3 times weekly.  She will follow-up with me if these things are not helpful.  Recent consult visits:  None  Hospital visits:  None in previous 6 months  Medications: Outpatient Encounter Medications as of 12/14/2020  Medication Sig Note   albuterol (VENTOLIN HFA) 108 (90 Base) MCG/ACT inhaler INHALE 2 PUFFS INTO THE LUNGS EVERY 6 HOURS AS NEEDED FOR WHEEZING OR SHORTNESS OF BREATH    amLODipine (NORVASC) 10 MG tablet Take 1 tablet (10 mg total) by mouth daily.    ARIPiprazole (ABILIFY) 5 MG tablet Take 1 tablet (5 mg total) by mouth daily.    ARNUITY ELLIPTA 100 MCG/ACT AEPB INHALE 1 PUFF INTO THE LUNGS DAILY    benzonatate (TESSALON) 100 MG capsule Take 1 capsule (100 mg total) by mouth 2 (two) times daily as needed for cough.    busPIRone (BUSPAR) 5 MG tablet Take 5 mg by mouth 2 (two) times daily.    carvedilol (COREG) 25 MG tablet Take 1 tablet (25 mg total) by mouth 2 (two) times daily with a meal.    desvenlafaxine (PRISTIQ) 50 MG 24 hr tablet Take 50 mg by mouth daily.    doxepin (SINEQUAN) 25 MG capsule Take 25 mg by mouth 3 (three) times daily as needed.    Dulaglutide (TRULICITY) 4.09 WJ/1.9JY SOPN Inject 0.75 mg into the skin once a week.    empagliflozin (JARDIANCE) 10 MG TABS tablet Take 1 tablet (10 mg total) by mouth daily.    EPINEPHrine 0.3 mg/0.3 mL IJ SOAJ injection Use as directed for severe allergic reaction    fluconazole (DIFLUCAN) 200 MG tablet Take 1 pill once a week for 12 weeks.    FLUoxetine (PROZAC) 20 MG capsule Take 1 capsule (20 mg total) by mouth daily.    glucose blood test strip Test  blood sugar daily and record on blood sugar log    Lancets (ACCU-CHEK SOFT TOUCH) lancets Check blood sugar daily and record in log    lisinopril-hydrochlorothiazide (ZESTORETIC) 20-25 MG tablet Take 1 tablet by mouth daily. 03/22/2020: Resumed on 03/14/2020 per patient   meclizine (ANTIVERT) 25 MG tablet Take 1 tablet (25 mg total) by mouth 3 (three) times daily as needed for dizziness.    metFORMIN (GLUCOPHAGE-XR) 500 MG 24 hr tablet TAKE 1 TABLET(500 MG) BY MOUTH DAILY WITH BREAKFAST    mycophenolate (CELLCEPT) 500 MG tablet Take 1 tablet by mouth 2 (two) times daily. Written by derm    nystatin (MYCOSTATIN/NYSTOP) powder Apply topically. (Patient not taking: Reported on 11/18/2020)    nystatin cream (MYCOSTATIN) APPLY TOPICALLY TO THE AFFECTED AREA TWICE DAILY FOR 7 DAYS THEN AS NEEDED    olopatadine (PATANOL) 0.1 % ophthalmic solution Place 1 drop into both eyes 2 (two) times daily.    Oxycodone HCl 10 MG TABS Take by mouth.    promethazine (PHENERGAN) 25 MG tablet take 1 tablet by mouth every 8 hours if needed for nausea and vomiting    rosuvastatin (CRESTOR) 10 MG tablet Take 1 tablet (10 mg total) by mouth daily.    tiZANidine (ZANAFLEX) 4 MG tablet Take 4 mg by mouth 2 (  two) times daily.    triamcinolone ointment (KENALOG) 0.1 % Apply 1 application topically 2 (two) times daily.    zolpidem (AMBIEN) 10 MG tablet Take 10 mg by mouth at bedtime.    No facility-administered encounter medications on file as of 12/14/2020.   Recent Relevant Labs: Lab Results  Component Value Date/Time   HGBA1C 6.6 (A) 11/18/2020 11:41 AM   HGBA1C 8.3 (A) 08/17/2020 11:35 AM   HGBA1C 6.8 08/14/2019 08:38 AM   HGBA1C 9.8 (A) 11/24/2018 02:50 PM   MICROALBUR <0.7 01/04/2020 10:46 AM    Kidney Function Lab Results  Component Value Date/Time   CREATININE 1.29 (H) 05/06/2020 10:27 AM   CREATININE 1.53 (H) 02/15/2020 10:41 AM   CREATININE 0.91 02/08/2016 03:23 PM   CREATININE 0.71 12/06/2015 02:38 PM   GFR  43.77 (L) 05/06/2020 10:27 AM   GFRNONAA 59 (L) 11/24/2018 03:09 PM   GFRNONAA 69 02/08/2016 03:23 PM   GFRAA 68 11/24/2018 03:09 PM   GFRAA 79 02/08/2016 03:23 PM    Current antihyperglycemic regimen:  Trulicity 1.01 mg once a week Jardiance 10 mg daily Metformin 500 mg daily  What recent interventions/DTPs have been made to improve glycemic control:  No recent interventions or DTPs.  Have there been any recent hospitalizations or ED visits since last visit with CPP? No  Patient denies hypoglycemic symptoms.  Patient denies hyperglycemic symptoms.  How often are you checking your blood sugar? once daily  What are your blood sugars ranging?  Fasting: 118, 120, 125, 119  During the week, how often does your blood glucose drop below 70? Never  Are you checking your feet daily/regularly? Yes  Adherence Review: Is the patient currently on a STATIN medication? Yes Is the patient currently on ACE/ARB medication? Yes Does the patient have >5 day gap between last estimated fill dates? No  -Patient states she started back taking Crestor as advised, she is currently taking it three times a week without any issues or side effects.  Care Gaps: Medicare Annual Wellness: Completed Ophthalmology Exam: Next due on 04/12/2021 Foot Exam: Next due on 11/18/2021 Hemoglobin A1C: 6.6% on 11/18/2020 Colonoscopy: Next due on 12/14/2029 Dexa Scan: Completed Mammogram: Next due on 04/29/2021  Future Appointments  Date Time Provider [place]  12/20/2020  9:00 AM Ruby Cola, RD [place] [facility]  01/03/2021 10:00 AM [facility] CARE [facility]  02/24/2021 10:30 AM [name], MD [facility]  07/28/2021  2:30 PM [facility] HEALTH COACH [facility]    Star Rating Drugs: - No recent fill dates available Trulicity 7.51 mg Jardiance 10 mg Metformin 500 mg Lisinopril-HCTZ 20-25 mg Rosuvastatin 10 mg  April D Calhoun, [place] Pharmacist Assistant [phone]

## 2020-12-20 ENCOUNTER — Ambulatory Visit: Payer: Medicare HMO | Admitting: Skilled Nursing Facility1

## 2021-01-03 ENCOUNTER — Ambulatory Visit (INDEPENDENT_AMBULATORY_CARE_PROVIDER_SITE_OTHER): Payer: Medicare HMO | Admitting: *Deleted

## 2021-01-03 DIAGNOSIS — E119 Type 2 diabetes mellitus without complications: Secondary | ICD-10-CM

## 2021-01-03 DIAGNOSIS — I10 Essential (primary) hypertension: Secondary | ICD-10-CM

## 2021-01-03 NOTE — Chronic Care Management (AMB) (Signed)
Chronic Care Management   CCM RN Visit Note  01/03/2021 Name: Stacy Campbell MRN: [id number] DOB: 10/13/1955  Subjective: Stacy Campbell is a 65 y.o. year old female who is a primary care patient of [name], MD. The care management team was consulted for assistance with disease management and care coordination needs.    Engaged with patient by telephone for follow up visit in response to provider referral for case management and/or care coordination services.   Consent to Services:  The patient was given information about Chronic Care Management services, agreed to services, and gave verbal consent prior to initiation of services.  Please see initial visit note for detailed documentation.   Patient agreed to services and verbal consent obtained.   Assessment: Review of patient past medical history, allergies, medications, health status, including review of consultants reports, laboratory and other test data, was performed as part of comprehensive evaluation and provision of chronic care management services.   SDOH (Social Determinants of Health) assessments and interventions performed:    CCM Care Plan  Allergies  Allergen Reactions   Hydrocodone Itching   Linalool    Methylisothiazolinone Other (See Comments)    Other reaction(s): Other (See Comments) Positive patch test Positive patch test    Propylene Glycol    Aspirin Itching and Other (See Comments)    Lower dose doesn't make her itch (she takes it every day)   Eggs Or Egg-Derived Products Nausea Only and Other (See Comments)    No reaction if in another food   Hydrocodone-Guaifenesin Rash    Outpatient Encounter Medications as of 01/03/2021  Medication Sig Note   albuterol (VENTOLIN HFA) 108 (90 Base) MCG/ACT inhaler INHALE 2 PUFFS INTO THE LUNGS EVERY 6 HOURS AS NEEDED FOR WHEEZING OR SHORTNESS OF BREATH    amLODipine (NORVASC) 10 MG tablet Take 1 tablet (10 mg total) by mouth daily.     ARIPiprazole (ABILIFY) 5 MG tablet Take 1 tablet (5 mg total) by mouth daily.    ARNUITY ELLIPTA 100 MCG/ACT AEPB INHALE 1 PUFF INTO THE LUNGS DAILY    benzonatate (TESSALON) 100 MG capsule Take 1 capsule (100 mg total) by mouth 2 (two) times daily as needed for cough.    busPIRone (BUSPAR) 5 MG tablet Take 5 mg by mouth 2 (two) times daily.    carvedilol (COREG) 25 MG tablet Take 1 tablet (25 mg total) by mouth 2 (two) times daily with a meal.    desvenlafaxine (PRISTIQ) 50 MG 24 hr tablet Take 50 mg by mouth daily.    doxepin (SINEQUAN) 25 MG capsule Take 25 mg by mouth 3 (three) times daily as needed.    Dulaglutide (TRULICITY) 4.58 KD/9.8PJ SOPN Inject 0.75 mg into the skin once a week.    empagliflozin (JARDIANCE) 10 MG TABS tablet Take 1 tablet (10 mg total) by mouth daily.    EPINEPHrine 0.3 mg/0.3 mL IJ SOAJ injection Use as directed for severe allergic reaction    fluconazole (DIFLUCAN) 200 MG tablet Take 1 pill once a week for 12 weeks.    FLUoxetine (PROZAC) 20 MG capsule Take 1 capsule (20 mg total) by mouth daily.    glucose blood test strip Test blood sugar daily and record on blood sugar log    Lancets (ACCU-CHEK SOFT TOUCH) lancets Check blood sugar daily and record in log    lisinopril-hydrochlorothiazide (ZESTORETIC) 20-25 MG tablet Take 1 tablet by mouth daily. 03/22/2020: Resumed on 03/14/2020 per patient   meclizine (ANTIVERT)  25 MG tablet Take 1 tablet (25 mg total) by mouth 3 (three) times daily as needed for dizziness.    metFORMIN (GLUCOPHAGE-XR) 500 MG 24 hr tablet TAKE 1 TABLET(500 MG) BY MOUTH DAILY WITH BREAKFAST    mycophenolate (CELLCEPT) 500 MG tablet Take 1 tablet by mouth 2 (two) times daily. Written by derm    nystatin (MYCOSTATIN/NYSTOP) powder Apply topically. (Patient not taking: Reported on 11/18/2020)    nystatin cream (MYCOSTATIN) APPLY TOPICALLY TO THE AFFECTED AREA TWICE DAILY FOR 7 DAYS THEN AS NEEDED    olopatadine (PATANOL) 0.1 % ophthalmic solution  Place 1 drop into both eyes 2 (two) times daily.    Oxycodone HCl 10 MG TABS Take by mouth.    promethazine (PHENERGAN) 25 MG tablet take 1 tablet by mouth every 8 hours if needed for nausea and vomiting    rosuvastatin (CRESTOR) 10 MG tablet Take 1 tablet (10 mg total) by mouth daily.    tiZANidine (ZANAFLEX) 4 MG tablet Take 4 mg by mouth 2 (two) times daily.    triamcinolone ointment (KENALOG) 0.1 % Apply 1 application topically 2 (two) times daily.    zolpidem (AMBIEN) 10 MG tablet Take 10 mg by mouth at bedtime.    No facility-administered encounter medications on file as of 01/03/2021.    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   Cirrhosis of liver ([place]) - h/o hep C 01/04/2020   Degenerative joint disease (DJD) of lumbar spine 01/04/2020   Monoclonal gammopathy of unknown significance (MGUS) 01/23/2019   Osteoporosis with current pathological fracture 01/21/2019   Controlled type 2 diabetes mellitus without complication, without long-term current use of insulin ([place]) 12/12/2018   Hyperlipidemia associated with type 2 diabetes mellitus ([place]) 12/12/2018   Seasonal and perennial allergic rhinoconjunctivitis 02/12/2018   History of hepatitis C 10/11/2017   History of atrial fibrillation 10/11/2017   Former smoker 10/11/2017   Dyshidrotic eczema 09/10/2017   GERD (gastroesophageal reflux disease) 02/15/2017   Psoriasis 07/10/2015   Asthma with COPD ([place]) 05/25/2013   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   Obesity with serious comorbidity 03/07/2006   Major depression, recurrent, chronic ([place]) 03/07/2006   Essential hypertension 03/07/2006   Chronic low back pain 03/07/2006   Insomnia 03/07/2006    Conditions to be addressed/monitored:HTN and DMII  Care Plan : [place] (Adult)  Updates made by Leona Singleton, RN since 01/03/2021 12:00 AM     Problem: Knowledge deficit related to self care management of chronic medical  conditions   Priority: Medium     Long-Range Goal: Patient will work wth CCM team to gain knowledge for management of chronic medical conditions   Start Date: 11/29/2020  Expected End Date: 11/29/2021  Priority: Medium  Note:   Current Barriers:  Knowledge Deficits related to plan of care for management of HTN and DMII  Chronic Disease Management support and education needs related to HTN and DMII  Patient reporting she continues to  do well.  Attending Diabetes and Nutrition classes.  Happy with her with her weight loss and reduction in A1C.  Fasting blood  sugar this morning was 109 with recent ranges of 90-130's  Reports blood pressures have ranged 120/50-70's.  Denies any hypo, hyperglysemia or hypo/hypertension.  RNCM Clinical Goal(s):  Patient will verbalize basic understanding of HTN and DMII disease process and self health management plan as evidenced by monitoring blood pressure and blood sugars logging for provider  review take all medications exactly as prescribed and will call provider for medication related questions as evidenced by medication compliance    demonstrate ongoing health management independence as evidenced by decreasing Hgb A1C by 0.3  points and continued weight loss        through collaboration with RN Care manager, provider, and care team.   Interventions: 1:1 collaboration with primary care provider regarding development and update of comprehensive plan of care as evidenced by provider attestation and co-signature Inter-disciplinary care team collaboration (see longitudinal plan of care) Evaluation of current treatment plan related to  self management and patient's adherence to plan as established by provider   Diabetes:  (Status: Goal on Track (progressing): YES.) Long Term Goal   Lab Results  Component Value Date   HGBA1C 6.6 (A) 11/18/2020  Assessed patient's understanding of A1c goal: <7% Provided education to patient about basic DM disease  process; Reviewed medications with patient and discussed importance of medication adherence;        Counseled on importance of regular laboratory monitoring as prescribed;        Discussed plans with patient for ongoing care management follow up and provided patient with direct contact information for care management team;      Provided patient with written educational materials related to hypo and hyperglycemia and importance of correct treatment;       Advised patient, providing education and rationale, to check cbg twice daily and record        call provider for findings outside established parameters;       Review of patient status, including review of consultants reports, relevant laboratory and other test results, and medications completed;       Congratulated on A1C reduction and discussed ways to maintain   Hypertension: (Status: Goal on Track (progressing): YES.)  Long Term Goal Last practice recorded BP readings:  BP Readings from Last 3 Encounters:  11/18/20 124/70  08/17/20 116/60  05/06/20 136/78  Most recent eGFR/CrCl: No results found for: EGFR  No components found for: CRCL  Evaluation of current treatment plan related to hypertension self management and patient's adherence to plan as established by provider;   Provided education to patient re: stroke prevention, s/s of heart attack and stroke; Reviewed prescribed diet low sat carb modified Reviewed medications with patient and discussed importance of compliance;  Discussed plans with patient for ongoing care management follow up and provided patient with direct contact information for care management team; Advised patient, providing education and rationale, to monitor blood pressure daily and record, calling PCP for findings outside established parameters;  Discussed complications of poorly controlled blood pressure such as heart disease, stroke, circulatory complications, vision complications, kidney impairment, sexual  dysfunction;   Congratulated on current weight loss  Patient Goals/Self-Care Activities: Take medications as prescribed   Attend all scheduled provider appointments check blood sugar at prescribed times: twice daily take the blood sugar log to all doctor visits drink 6 to 8 glasses of water each day check blood pressure 3 times per week write blood pressure results in a log or diary learn about high blood pressure take blood pressure log to all doctor appointments call doctor for signs and symptoms of high blood pressure develop an action plan for high blood pressure keep all doctor appointments take medications for blood pressure exactly as prescribed       Plan:The care management team will reach out to the patient again over the next 45 days.  Hubert Azure  RN, MSN RN Care Management Coordinator  [place] [phone] [email or web address]

## 2021-01-03 NOTE — Patient Instructions (Signed)
Visit Information  Thank you for taking time to visit with me today. Please don't hesitate to contact me if I can be of assistance to you before our next scheduled telephone appointment.  Following are the goals we discussed today:  Take medications as prescribed   Attend all scheduled provider appointments check blood sugar at prescribed times: twice daily take the blood sugar log to all doctor visits drink 6 to 8 glasses of water each day check blood pressure 3 times per week write blood pressure results in a log or diary learn about high blood pressure take blood pressure log to all doctor appointments call doctor for signs and symptoms of high blood pressure develop an action plan for high blood pressure keep all doctor appointments take medications for blood pressure exactly as prescribed  Our next appointment is by telephone on 02/14/21    at 1000  Please call the care guide team at [phone] if you need to cancel or reschedule your appointment.   If you are experiencing a Mental Health or [place] or need someone to talk to, please call the Suicide and Crisis Lifeline: 988 call the [place] National Suicide Prevention Lifeline: (412)736-2149 or TTY: 865-542-7057 TTY (641)109-8377) to talk to a trained counselor call 1-800-273-TALK (toll free, 24 hour hotline) go to [facility] [address], [place] [phone]) call 911   Patient verbalizes understanding of instructions provided today and agrees to view in [place].   Hubert Azure RN, MSN RN Care Management Coordinator  [place] [phone] [email or web address]

## 2021-01-06 ENCOUNTER — Other Ambulatory Visit: Payer: Self-pay | Admitting: Family Medicine

## 2021-01-07 DIAGNOSIS — E1169 Type 2 diabetes mellitus with other specified complication: Secondary | ICD-10-CM

## 2021-01-07 DIAGNOSIS — I10 Essential (primary) hypertension: Secondary | ICD-10-CM

## 2021-01-07 DIAGNOSIS — Z7984 Long term (current) use of oral hypoglycemic drugs: Secondary | ICD-10-CM

## 2021-01-24 ENCOUNTER — Other Ambulatory Visit: Payer: Self-pay | Admitting: Family Medicine

## 2021-01-24 ENCOUNTER — Encounter: Payer: Medicare HMO | Attending: Family Medicine | Admitting: Skilled Nursing Facility1

## 2021-01-24 ENCOUNTER — Other Ambulatory Visit: Payer: Self-pay

## 2021-01-24 DIAGNOSIS — E119 Type 2 diabetes mellitus without complications: Secondary | ICD-10-CM | POA: Insufficient documentation

## 2021-01-24 NOTE — Progress Notes (Signed)
Diabetes Self-Management Education  Visit Type:     01/24/2021  Stacy Campbell, identified by name and date of birth, is a 66 y.o. female with a diagnosis of Diabetes:  .   ASSESSMENT   Pt states she is allergic to eggs but can eat them in baked goods.  Pt states she does take: jardiance, trulictiy and metformin  Pt states she does have cirrhosis of the liver.  Pt states she quit smoking about 10-11 years ago.  Pt states she does struggle with depression but does talk therapy and works with a psychiatrist.   Pt states she checks her blood sugars about 2 times a day: fasting 89, later in the evening: 121.  Pt state she takes good care of her gums and her dentures.   Pt states at night she has to have a pillow between her legs and has restless legs. Pt states she does struggle with insomnia.   Pt states she does arm chair exercises 3 days a  week for 30 minutes.   Pt states her blood sugars having been good about 127. Pt states her blood pressures have been about 128/76 Pts A1C is currently 6.6 down from 8.3.  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  Pt states she sometimes does not eat even when she feels hunger.  Pt states she will snack on fruit throughout the day.   Goals: -be sure to never skip meals as you are already not eating a whole lot -get some resistance bands to do daily  -look for resistance band ideas on youtube: type in resistance bands in arm chair exercises  -aim for 2 servings of fruit per day and limit crackers to once per day -Aim for at least 1 full cup of non starchy vegetables for lunch and dinner; if you do not eat it all it can be a snack for later -eat cottage cheese or greek yogurt daily   Diabetes Self-Management Education  Visit Type: Follow-up   01/24/2021  Stacy Campbell, identified by name and date of birth, is a 66 y.o. female with a diagnosis of Diabetes:  .    ASSESSMENT  Weight 223 lb 14.4 oz (101.6 kg). Body mass index is 38.43 kg/m.   Diabetes Self-Management Education - 01/24/21 1118       Visit Information   Visit Type Follow-up      Health Coping   How would you rate your overall health? Good      Psychosocial Assessment   Patient Belief/Attitude about Diabetes Motivated to manage diabetes    Self-care barriers None    Self-management support Friends;Family    Patient Concerns Nutrition/Meal planning    Special Needs None    Preferred Learning Style Visual    Learning Readiness Change in progress      Pre-Education Assessment   Patient understands the diabetes disease and treatment process. Demonstrates understanding / competency    Patient understands incorporating nutritional management into lifestyle. Demonstrates understanding / competency    Patient undertands incorporating physical activity into lifestyle. Demonstrates understanding / competency    Patient understands using medications safely. Demonstrates understanding / competency    Patient understands monitoring blood glucose, interpreting and using results Demonstrates understanding / competency    Patient understands prevention, detection, and treatment of acute complications. Demonstrates understanding / competency    Patient understands prevention, detection, and treatment of chronic complications. Demonstrates understanding / competency  Patient understands how to develop strategies to address psychosocial issues. Demonstrates understanding / competency    Patient understands how to develop strategies to promote health/change behavior. Demonstrates understanding / competency      Complications   Last HgB A1C per patient/outside source 6.6 %    How often do you check your blood sugar? 1-2 times/day    Fasting Blood glucose range (mg/dL) 70-129    Number of hypoglycemic episodes per month 0    Number of hyperglycemic episodes per week 0    Have you had a  dilated eye exam in the past 12 months? Yes    Have you had a dental exam in the past 12 months? Yes    Are you checking your feet? Yes    How many days per week are you checking your feet? 7      Dietary Intake   Breakfast boiled egg + 1/2 1 packet oatmeal + 1 [place] bacon    Snack (morning) crackers saltines or ritz    Lunch half sandwich + lunch meat + tomato + lattuce + canned soup    Dinner salsbury steak + half cup salad: tomato, cucumbers + half baked potato + butter    Beverage(s) sugar free lemonade, water      Exercise   Exercise Type Light (walking / raking leaves)    How many days per week to you exercise? 3    How many minutes per day do you exercise? 15    Total minutes per week of exercise 45      Patient Education   Previous Diabetes Education Yes (please comment)    Nutrition management  Role of diet in the treatment of diabetes and the relationship between the three main macronutrients and blood glucose level;Food label reading, portion sizes and measuring food.;Carbohydrate counting    Physical activity and exercise  Role of exercise on diabetes management, blood pressure control and cardiac health.;Identified with patient nutritional and/or medication changes necessary with exercise.    Medications Reviewed patients medication for diabetes, action, purpose, timing of dose and side effects.    Acute complications Taught treatment of hypoglycemia - the 15 rule.    Chronic complications Dental care;Retinopathy and reason for yearly dilated eye exams;Reviewed with patient heart disease, higher risk of, and prevention      Individualized Goals (developed by patient)   Nutrition Follow meal plan discussed;General guidelines for healthy choices and portions discussed    Physical Activity Exercise 5-7 days per week;15 minutes per day;30 minutes per day    Medications take my medication as prescribed    Monitoring  test my blood glucose as discussed      Patient  Self-Evaluation of Goals - Patient rates self as meeting previously set goals (% of time)   Nutrition >75%    Physical Activity 25 - 50%    Medications >75%    Monitoring >75%    Problem Solving >75%    Reducing Risk >75%    Health Coping >75%      Post-Education Assessment   Patient understands the diabetes disease and treatment process. Demonstrates understanding / competency    Patient understands incorporating nutritional management into lifestyle. Demonstrates understanding / competency    Patient undertands incorporating physical activity into lifestyle. Demonstrates understanding / competency    Patient understands using medications safely. Demonstrates understanding / competency    Patient understands monitoring blood glucose, interpreting and using results Demonstrates understanding / competency    Patient understands  prevention, detection, and treatment of acute complications. Demonstrates understanding / competency    Patient understands prevention, detection, and treatment of chronic complications. Demonstrates understanding / competency    Patient understands how to develop strategies to address psychosocial issues. Demonstrates understanding / competency    Patient understands how to develop strategies to promote health/change behavior. Demonstrates understanding / competency      Outcomes   Expected Outcomes Demonstrated interest in learning. Expect positive outcomes    Future DMSE PRN    Program Status Completed      Subsequent Visit   Since your last visit have you continued or begun to take your medications as prescribed? Yes    Since your last visit have you had your blood pressure checked? Yes    Is your most recent blood pressure lower, unchanged, or higher since your last visit? Unchanged    Since your last visit have you experienced any weight changes? Loss    Weight Loss (lbs) 8    Since your last visit, are you checking your blood glucose at least once a day?  Yes             Individualized Plan for Diabetes Self-Management Training:   Learning Objective:  Patient will have a greater understanding of diabetes self-management. Patient education plan is to attend individual and/or group sessions per assessed needs and concerns.  Expected Outcomes:  Demonstrated interest in learning. Expect positive outcomes  Education material provided: Meal plan card and Diabetes Resources  If problems or questions, patient to contact team via:  Email  Future DSME appointment: PRN Individualized Plan for Diabetes Self-Management Training:   Learning Objective:  Patient will have a greater understanding of diabetes self-management. Patient education plan is to attend individual and/or group sessions per assessed needs and concerns.   Expected Outcomes:     Education material provided: ADA - How to Thrive: A Guide for Your Journey with Diabetes, Food label handouts, Meal plan card, My Plate, and Snack sheet  If problems or questions, patient to contact team via:  Phone and Email  Future DSME appointment:   3 months

## 2021-02-06 ENCOUNTER — Other Ambulatory Visit: Payer: Self-pay | Admitting: Family Medicine

## 2021-02-06 DIAGNOSIS — E119 Type 2 diabetes mellitus without complications: Secondary | ICD-10-CM

## 2021-02-06 DIAGNOSIS — I10 Essential (primary) hypertension: Secondary | ICD-10-CM

## 2021-02-13 DIAGNOSIS — Z79899 Other long term (current) drug therapy: Secondary | ICD-10-CM | POA: Diagnosis not present

## 2021-02-13 DIAGNOSIS — L304 Erythema intertrigo: Secondary | ICD-10-CM | POA: Diagnosis not present

## 2021-02-13 DIAGNOSIS — L299 Pruritus, unspecified: Secondary | ICD-10-CM | POA: Diagnosis not present

## 2021-02-13 DIAGNOSIS — L308 Other specified dermatitis: Secondary | ICD-10-CM | POA: Diagnosis not present

## 2021-02-14 ENCOUNTER — Ambulatory Visit (INDEPENDENT_AMBULATORY_CARE_PROVIDER_SITE_OTHER): Payer: Medicare HMO | Admitting: *Deleted

## 2021-02-14 DIAGNOSIS — E119 Type 2 diabetes mellitus without complications: Secondary | ICD-10-CM

## 2021-02-14 DIAGNOSIS — I10 Essential (primary) hypertension: Secondary | ICD-10-CM

## 2021-02-14 NOTE — Patient Instructions (Addendum)
Visit Information  Thank you for taking time to visit with me today. Please don't hesitate to contact me if I can be of assistance to you before our next scheduled telephone appointment.  Following are the goals we discussed today:  Take medications as prescribed   Attend all scheduled provider appointments check blood sugar at prescribed times: twice daily take the blood sugar log to all doctor visits drink 6 to 8 glasses of water each day check blood pressure 3 times per week write blood pressure results in a log or diary learn about high blood pressure take blood pressure log to all doctor appointments call doctor for signs and symptoms of high blood pressure develop an action plan for high blood pressure keep all doctor appointments take medications for blood pressure exactly as prescribed   Our next appointment is by telephone on 3/14 at 1000  Please call the care guide team at [phone] if you need to cancel or reschedule your appointment.   If you are experiencing a Mental Health or [place] or need someone to talk to, please call the Suicide and Crisis Lifeline: 988 call the [place] National Suicide Prevention Lifeline: (302) 081-4892 or TTY: 907-498-1809 TTY 937-870-1255) to talk to a trained counselor call 1-800-273-TALK (toll free, 24 hour hotline) go to [facility] [address] [place] [phone]) call 911   Patient verbalizes understanding of instructions and care plan provided today and agrees to view in [place]. Active MyChart status confirmed with patient.    Hubert Azure RN, MSN RN Care Management Coordinator  [place] [phone] [email or web address]

## 2021-02-14 NOTE — Chronic Care Management (AMB) (Signed)
Chronic Care Management   CCM RN Visit Note  02/14/2021 Name: Stacy Campbell MRN: [id number] DOB: 1955/04/09  Subjective: Stacy Campbell is a 66 y.o. year old female who is a primary care patient of [name], MD. The care management team was consulted for assistance with disease management and care coordination needs.    Engaged with patient by telephone for follow up visit in response to provider referral for case management and/or care coordination services.   Consent to Services:  The patient was given information about Chronic Care Management services, agreed to services, and gave verbal consent prior to initiation of services.  Please see initial visit note for detailed documentation.   Patient agreed to services and verbal consent obtained.   Assessment: Review of patient past medical history, allergies, medications, health status, including review of consultants reports, laboratory and other test data, was performed as part of comprehensive evaluation and provision of chronic care management services.   SDOH (Social Determinants of Health) assessments and interventions performed:    CCM Care Plan  Allergies  Allergen Reactions   Hydrocodone Itching   Linalool    Methylisothiazolinone Other (See Comments)    Other reaction(s): Other (See Comments) Positive patch test Positive patch test    Propylene Glycol    Aspirin Itching and Other (See Comments)    Lower dose doesn't make her itch (she takes it every day)   Eggs Or Egg-Derived Products Nausea Only and Other (See Comments)    No reaction if in another food   Hydrocodone-Guaifenesin Rash    Outpatient Encounter Medications as of 02/14/2021  Medication Sig   albuterol (VENTOLIN HFA) 108 (90 Base) MCG/ACT inhaler INHALE 2 PUFFS INTO THE LUNGS EVERY 6 HOURS AS NEEDED FOR WHEEZING OR SHORTNESS OF BREATH   amLODipine (NORVASC) 10 MG tablet TAKE 1 TABLET(10 MG) BY MOUTH DAILY   ARIPiprazole (ABILIFY) 5  MG tablet Take 1 tablet (5 mg total) by mouth daily.   ARNUITY ELLIPTA 100 MCG/ACT AEPB INHALE 1 PUFF INTO THE LUNGS DAILY   benzonatate (TESSALON) 100 MG capsule Take 1 capsule (100 mg total) by mouth 2 (two) times daily as needed for cough.   busPIRone (BUSPAR) 5 MG tablet Take 5 mg by mouth 2 (two) times daily.   carvedilol (COREG) 25 MG tablet TAKE 1 TABLET(25 MG) BY MOUTH TWICE DAILY WITH A MEAL   desvenlafaxine (PRISTIQ) 50 MG 24 hr tablet Take 50 mg by mouth daily.   doxepin (SINEQUAN) 25 MG capsule Take 25 mg by mouth 3 (three) times daily as needed.   EPINEPHrine 0.3 mg/0.3 mL IJ SOAJ injection Use as directed for severe allergic reaction   fluconazole (DIFLUCAN) 200 MG tablet Take 1 pill once a week for 12 weeks.   FLUoxetine (PROZAC) 20 MG capsule Take 1 capsule (20 mg total) by mouth daily.   glucose blood test strip Test blood sugar daily and record on blood sugar log   JARDIANCE 10 MG TABS tablet TAKE 1 TABLET(10 MG) BY MOUTH DAILY   Lancets (ACCU-CHEK SOFT TOUCH) lancets Check blood sugar daily and record in log   lisinopril-hydrochlorothiazide (ZESTORETIC) 20-25 MG tablet TAKE 1 TABLET BY MOUTH DAILY   meclizine (ANTIVERT) 25 MG tablet Take 1 tablet (25 mg total) by mouth 3 (three) times daily as needed for dizziness.   metFORMIN (GLUCOPHAGE-XR) 500 MG 24 hr tablet TAKE 1 TABLET(500 MG) BY MOUTH DAILY WITH BREAKFAST   mycophenolate (CELLCEPT) 500 MG tablet Take 1 tablet by  mouth 2 (two) times daily. Written by derm   nystatin (MYCOSTATIN/NYSTOP) powder Apply topically. (Patient not taking: Reported on 11/18/2020)   nystatin cream (MYCOSTATIN) APPLY TOPICALLY TO THE AFFECTED AREA TWICE DAILY FOR 7 DAYS THEN AS NEEDED   olopatadine (PATANOL) 0.1 % ophthalmic solution Place 1 drop into both eyes 2 (two) times daily.   Oxycodone HCl 10 MG TABS Take by mouth.   promethazine (PHENERGAN) 25 MG tablet take 1 tablet by mouth every 8 hours if needed for nausea and vomiting   rosuvastatin  (CRESTOR) 10 MG tablet TAKE 1 TABLET(10 MG) BY MOUTH DAILY   tiZANidine (ZANAFLEX) 4 MG tablet Take 4 mg by mouth 2 (two) times daily.   triamcinolone ointment (KENALOG) 0.1 % Apply 1 application topically 2 (two) times daily.   TRULICITY 7.94 IA/1.6PV SOPN ADMINISTER 0.75 MG UNDER THE SKIN 1 TIME A WEEK   zolpidem (AMBIEN) 10 MG tablet Take 10 mg by mouth at bedtime.   No facility-administered encounter medications on file as of 02/14/2021.    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/2020   Monoclonal gammopathy of unknown significance (MGUS) 01/23/2019   Osteoporosis with current pathological fracture 01/21/2019   Controlled type 2 diabetes mellitus without complication, without long-term current use of insulin ([place]) 12/12/2018   Hyperlipidemia associated with type 2 diabetes mellitus ([place]) 12/12/2018   Seasonal and perennial allergic rhinoconjunctivitis 02/12/2018   History of hepatitis C 10/11/2017   History of atrial fibrillation 10/11/2017   Former smoker 10/11/2017   Dyshidrotic eczema 09/10/2017   GERD (gastroesophageal reflux disease) 02/15/2017   Psoriasis 07/10/2015   Asthma with COPD ([place]) 05/25/2013   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   Obesity with serious comorbidity 03/07/2006   Major depression, recurrent, chronic ([place]) 03/07/2006   Essential hypertension 03/07/2006   Chronic low back pain 03/07/2006   Insomnia 03/07/2006    Conditions to be addressed/monitored:HTN and DMII  Care Plan : [place] (Adult)  Updates made by Leona Singleton, RN since 02/14/2021 12:00 AM     Problem: Knowledge deficit related to self care management of chronic medical conditions   Priority: Medium     Long-Range Goal: Patient will work wth CCM team to gain knowledge for management of chronic medical conditions   Start  Date: 11/29/2020  Expected End Date: 11/29/2021  This Visit's Progress: On track  Priority: Medium  Note:   Current Barriers:  Knowledge Deficits related to plan of care for management of HTN and DMII  Chronic Disease Management support and education needs related to HTN and DMII  Patient reporting she continues to  do well.  Continues to attend Diabetes and Nutrition classes.  Fasting blood  sugar this morning was 103 with recent ranges of 90-100's  Reports blood pressures have ranged 100-130/70's.  Denies any hypo, hyperglysemia or hypo/hypertension, chest pains or SOB.  RNCM Clinical Goal(s):  Patient will verbalize basic understanding of HTN and DMII disease process and self health management plan as evidenced by monitoring blood pressure and blood sugars logging for provider review take all medications exactly as prescribed and will call provider for medication related questions as evidenced by medication compliance    demonstrate ongoing health management independence as evidenced by decreasing Hgb A1C by 0.3  points and continued weight loss  through collaboration with Consulting civil engineer, provider, and care team.   Interventions: 1:1 collaboration with primary care provider regarding development and update of comprehensive plan of care as evidenced by provider attestation and co-signature Inter-disciplinary care team collaboration (see longitudinal plan of care) Evaluation of current treatment plan related to  self management and patient's adherence to plan as established by provider   Diabetes:  (Status: Goal on Track (progressing): YES.) Long Term Goal   Lab Results  Component Value Date   HGBA1C 6.6 (A) 11/18/2020  Assessed patient's understanding of A1c goal: <7% Provided education to patient about basic DM disease process; Reviewed medications with patient and discussed importance of medication adherence;        Counseled on importance of regular laboratory monitoring as  prescribed;        Discussed plans with patient for ongoing care management follow up and provided patient with direct contact information for care management team;      Provided patient with written educational materials related to hypo and hyperglycemia and importance of correct treatment;       Advised patient, providing education and rationale, to check cbg twice daily and record        call provider for findings outside established parameters;       Review of patient status, including review of consultants reports, relevant laboratory and other test results, and medications completed;       Congratulated on A1C reduction and discussed ways to maintain Encouraged patient to discuss A1C goal with provider   Hypertension: (Status: Goal on Track (progressing): YES.)  Long Term Goal Last practice recorded BP readings:  BP Readings from Last 3 Encounters:  11/18/20 124/70  08/17/20 116/60  05/06/20 136/78  Most recent eGFR/CrCl: No results found for: EGFR  No components found for: CRCL  Evaluation of current treatment plan related to hypertension self management and patient's adherence to plan as established by provider;   Provided education to patient re: stroke prevention, s/s of heart attack and stroke; Reviewed prescribed diet low sat carb modified Reviewed medications with patient and discussed importance of compliance;  Discussed plans with patient for ongoing care management follow up and provided patient with direct contact information for care management team; Advised patient, providing education and rationale, to monitor blood pressure daily and record, calling PCP for findings outside established parameters;  Discussed complications of poorly controlled blood pressure such as heart disease, stroke, circulatory complications, vision complications, kidney impairment, sexual dysfunction;   Congratulated on current weight loss  Patient Goals/Self-Care Activities: Take medications as  prescribed   Attend all scheduled provider appointments check blood sugar at prescribed times: twice daily take the blood sugar log to all doctor visits drink 6 to 8 glasses of water each day check blood pressure 3 times per week write blood pressure results in a log or diary learn about high blood pressure take blood pressure log to all doctor appointments call doctor for signs and symptoms of high blood pressure develop an action plan for high blood pressure keep all doctor appointments take medications for blood pressure exactly as prescribed       Plan:The care management team will reach out to the patient again over the next 45 days.  Hubert Azure RN, MSN RN Care Management Coordinator  [place] [phone] [email or web address]

## 2021-02-24 ENCOUNTER — Other Ambulatory Visit: Payer: Self-pay

## 2021-02-24 ENCOUNTER — Encounter: Payer: Self-pay | Admitting: Family Medicine

## 2021-02-24 ENCOUNTER — Ambulatory Visit (INDEPENDENT_AMBULATORY_CARE_PROVIDER_SITE_OTHER): Payer: Medicare HMO | Admitting: Family Medicine

## 2021-02-24 VITALS — BP 110/62 | HR 80 | Temp 97.9°F | Ht 64.0 in | Wt 225.4 lb

## 2021-02-24 DIAGNOSIS — J449 Chronic obstructive pulmonary disease, unspecified: Secondary | ICD-10-CM

## 2021-02-24 DIAGNOSIS — K7469 Other cirrhosis of liver: Secondary | ICD-10-CM | POA: Diagnosis not present

## 2021-02-24 DIAGNOSIS — E1169 Type 2 diabetes mellitus with other specified complication: Secondary | ICD-10-CM

## 2021-02-24 DIAGNOSIS — F5101 Primary insomnia: Secondary | ICD-10-CM

## 2021-02-24 DIAGNOSIS — E785 Hyperlipidemia, unspecified: Secondary | ICD-10-CM | POA: Diagnosis not present

## 2021-02-24 DIAGNOSIS — F339 Major depressive disorder, recurrent, unspecified: Secondary | ICD-10-CM | POA: Diagnosis not present

## 2021-02-24 DIAGNOSIS — E119 Type 2 diabetes mellitus without complications: Secondary | ICD-10-CM

## 2021-02-24 DIAGNOSIS — D472 Monoclonal gammopathy: Secondary | ICD-10-CM | POA: Diagnosis not present

## 2021-02-24 DIAGNOSIS — I10 Essential (primary) hypertension: Secondary | ICD-10-CM | POA: Diagnosis not present

## 2021-02-24 LAB — LIPID PANEL
Cholesterol: 102 mg/dL (ref 0–200)
HDL: 36.3 mg/dL — ABNORMAL LOW (ref >39.00)
LDL Cholesterol: 27 mg/dL (ref 0–99)
NonHDL: 65.47
Total CHOL/HDL Ratio: 3
Triglycerides: 194 mg/dL — ABNORMAL HIGH (ref 0.0–149.0)
VLDL: 38.8 mg/dL (ref 0.0–40.0)

## 2021-02-24 LAB — MICROALBUMIN / CREATININE URINE RATIO
Creatinine,U: 47.2 mg/dL
Microalb Creat Ratio: 1.5 mg/g (ref 0.0–30.0)
Microalb, Ur: <0.7 mg/dL (ref 0.0–1.9)

## 2021-02-24 LAB — POCT GLYCOSYLATED HEMOGLOBIN (HGB A1C): Hemoglobin A1C: 6.6 % — AB (ref 4.0–5.6)

## 2021-02-24 NOTE — Progress Notes (Signed)
Subjective  CC:  Chief Complaint  Patient presents with   Diabetes    Blood sugars at home has been good.    HPI: Stacy Campbell is a 66 y.o. female who presents to the office today for follow up of diabetes and chronic problems. Diabetes follow up: Her diabetic control is reported as Unchanged. Tolerating met xr 500 Jardiance and Trulicity.  No side effects.  She denies exertional CP or SOB or symptomatic hypoglycemia. She denies foot sores or paresthesias.  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  No open wounds. Major depression continues to be active.  Struggling because the help of her daughter continues to be poor.  Seeing psychiatry.  On multiple mood medicines.  Having some mild dizziness, likely a side effect from combination of medications.  No lightheadedness.  Chest pain or palpitations no suicidal thoughts Multiple chronic problems including hypertension, hyperlipidemia, MGUS, cirrhosis and insomnia remains stable.  I reviewed recent [facility] evaluation, follow-up for MGUS.  Stable CBC.  Lipids are due for recheck.  She remains on her statin and tolerates it.  She is nonfasting.  Blood pressure has been well controlled.  Running a little bit low today.  She denies symptoms of hypotension. Health maintenance: Mammogram scheduled for April.  Has eye exam for diabetes scheduled next month.  Immunizations are current   Wt Readings from Last 3 Encounters:  02/24/21 225 lb 6.4 oz (102.2 kg)  01/24/21 223 lb 14.4 oz (101.6 kg)  11/18/20 225 lb (102.1 kg)    BP Readings from Last 3 Encounters:  02/24/21 110/62  11/18/20 124/70  08/17/20 116/60    Assessment  1. Controlled type 2 diabetes mellitus without complication, without long-term current use of insulin ([place])   2. Asthma with COPD ([place])   3. Essential hypertension   4. Hyperlipidemia associated with type 2 diabetes mellitus ([place])   5. Primary insomnia   6. Major depression,  recurrent, chronic ([place])   7. Monoclonal gammopathy of unknown significance (MGUS)   8. Other cirrhosis of liver (HCC)   9. Class 2 severe obesity due to excess calories with serious comorbidity in adult, unspecified BMI (HCC) Chronic     Plan  Diabetes is currently very well controlled.  Continue Jardiance 10 mg daily, Trulicity 4.49 mg weekly and metformin XR 500 mg daily.  Can consider stopping the metformin if remains controlled.  Renal function and electrolytes are stable.  Monitor foot care.  Diabetic eye exam upcoming. Hypertension: Continue Zestoretic 20/25 daily, Coreg 25 twice daily and amlodipine 10 daily.  Blood pressures running borderline low.  Becomes symptomatic, can decrease medications.  Recheck 3 months. Hyperlipidemia: Nonfasting for recheck today.  Crestor 10 mg nightly. Continue Ambien, chronic Ambien use for chronic insomnia. Major depression is active, continue with counselor and psychiatry. MGUS: Stable per [facility] hematology Monitoring liver functions, currently stable Asthma and COPD are stable on Anoro Ellipta.  No recent flares  Follow up: 3 months for CPE.  Follow-up diabetes and chronic medical problems. Orders Placed This Encounter  Procedures   Microalbumin / creatinine urine ratio   Lipid panel   POCT HgB A1C   No orders of the defined types were placed in this encounter.     Immunization History  Administered Date(s) Administered   Fluad Quad(high Dose 65+) 11/18/2020   Hepatitis B, adult 10/12/2014, 11/09/2014, 04/11/2015   Influenza Split 11/14/2010, 10/09/2011   Influenza Whole 10/13/2007, 01/10/2009, 11/22/2009  Influenza,inj,Quad PF,6+ Mos 10/10/2012, 10/08/2013, 10/15/2014, 09/29/2015, 11/06/2016, 09/10/2017, 10/15/2018, 09/30/2019   PFIZER(Purple Top)SARS-COV-2 Vaccination 03/20/2019, 04/15/2019, 10/15/2019   PNEUMOCOCCAL CONJUGATE-20 11/18/2020   Pneumococcal Polysaccharide-23 01/10/2009   Td 01/09/1999, 01/10/2009    Diabetes Related  Lab Review: Lab Results  Component Value Date   HGBA1C 6.6 (A) 11/18/2020   HGBA1C 8.3 (A) 08/17/2020   HGBA1C 6.9 (A) 05/06/2020    Lab Results  Component Value Date   MICROALBUR <0.7 01/04/2020   Lab Results  Component Value Date   CREATININE 1.29 (H) 05/06/2020   BUN 30 (H) 05/06/2020   NA 137 05/06/2020   K 5.0 05/06/2020   CL 101 05/06/2020   CO2 26 05/06/2020   Lab Results  Component Value Date   CHOL 140 02/15/2020   CHOL 174 01/04/2020   CHOL 178 12/11/2018   Lab Results  Component Value Date   HDL 37.50 (L) 02/15/2020   HDL 38.10 (L) 01/04/2020   HDL 32 (L) 12/11/2018   Lab Results  Component Value Date   LDLCALC 73 02/15/2020   LDLCALC 104 (H) 01/04/2020   LDLCALC 111 (H) 12/11/2018   Lab Results  Component Value Date   TRIG 148.0 02/15/2020   TRIG 159.0 (H) 01/04/2020   TRIG 196 (H) 12/11/2018   Lab Results  Component Value Date   CHOLHDL 4 02/15/2020   CHOLHDL 5 01/04/2020   CHOLHDL 5.6 (H) 12/11/2018   No results found for: LDLDIRECT The 10-year ASCVD risk score (Arnett DK, et al., 2019) is: 11.7%   Values used to calculate the score:     Age: 67 years     Sex: Female     Is Non-Hispanic African American: Yes     Diabetic: Yes     Tobacco smoker: No     Systolic Blood Pressure: 945 mmHg     Is BP treated: Yes     HDL Cholesterol: 37.5 mg/dL     Total Cholesterol: 140 mg/dL I have reviewed the [place] history. Patient Active Problem List   Diagnosis Date Noted   Cirrhosis of liver ([place]) - h/o hep C 01/04/2020    Priority: High   Monoclonal gammopathy of unknown significance (MGUS) 01/23/2019    Priority: High   Osteoporosis with current pathological fracture 01/21/2019    Priority: High    History of chronic pred for urticaria:  Advised to start alendronate jan 2021, stop jan 2026    Controlled type 2 diabetes mellitus without complication, without long-term current use of insulin ([place]) 12/12/2018    Priority: High     Diagnosed 2020    Hyperlipidemia associated with type 2 diabetes mellitus ([place]) 12/12/2018    Priority: High    LDL goal of 70 given diabetes    Asthma with COPD ([place]) 05/25/2013    Priority: High   Class 2 severe obesity due to excess calories with serious comorbidity in adult, unspecified BMI ([place])     Priority: High   Major depression, recurrent, chronic ([place]) 03/07/2006    Priority: High    Sees psychiatry: Sharon Seller, [place]; [facility] Psychotherapy with Zella Ball, PsyD    Essential hypertension 03/07/2006    Priority: High   Chronic low back pain 03/07/2006    Priority: High   Insomnia 03/07/2006    Priority: High    On chronic Ambien (started by previous provider).     Degenerative joint disease (DJD) of lumbar spine 01/04/2020    Priority: Medium  History of hepatitis C 10/11/2017    Priority: Medium     S/P Treatment with sofosbuvir and simeprivir.  Followed by hep clinic.  Undetectable viral load - Cured.    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 Her smoking use included cigarettes. She has a 9.25 pack-year smoking history. She has never used smokeless tobacco. She reports that she does not drink alcohol and does not use drugs.  Review of Systems: Ophthalmic: negative for eye pain, loss of vision or double vision Cardiovascular: negative for chest pain Respiratory: negative for SOB or persistent cough Gastrointestinal: negative for abdominal pain Genitourinary:  negative for dysuria or gross hematuria MSK: negative for foot lesions Neurologic: negative for weakness or gait disturbance  Objective  Vitals: BP 110/62    Pulse 80    Temp 97.9 F (36.6 C) (Temporal)    Ht '5\' 4"'  (1.626 m)    Wt 225 lb 6.4 oz (102.2 kg)    SpO2 94%    BMI 38.69 kg/m  General: well appearing, no acute distress  Psych:  Alert and oriented, flat mood and affect HEENT:  Normocephalic, atraumatic, moist mucous membranes, supple neck  Cardiovascular:  Nl S1 and S2, RRR without murmur, gallop or rub. no edema Respiratory:  Good breath sounds bilaterally, CTAB with normal effort, no rales Foot exam: no erythema, pallor, or cyanosis visible nl proprioception and sensation to monofilament testing bilaterally, +2 distal pulses bilaterally, annular eczematous light patch at dorsal left foot.    Diabetic education: ongoing education regarding chronic disease management for diabetes was given today. We continue to reinforce the ABC's of diabetic management: A1c (<7 or 8 dependent upon patient), tight blood pressure control, and cholesterol management with goal LDL < 100 minimally. We discuss diet strategies, exercise recommendations, medication options and possible side effects. At each visit, we review recommended immunizations and preventive care recommendations for diabetics and stress that good diabetic control can prevent other problems. See below for this patient's data.   Commons side effects, risks, benefits, and alternatives for medications and treatment plan prescribed today were discussed, and the patient expressed understanding of the given instructions. Patient is instructed to call or message via MyChart if he/she has any questions or concerns regarding our treatment plan. No barriers to understanding were identified. We discussed Red Flag symptoms and signs in detail. Patient expressed understanding regarding what to do in case of urgent or emergency type symptoms.   Medication list was reconciled, printed and provided to the patient in AVS. Patient instructions and summary information was reviewed with the patient as documented in the AVS. This note was prepared with assistance of Dragon voice recognition software. Occasional wrong-word or sound-a-like substitutions may have occurred due to the inherent limitations of voice recognition software  This visit occurred during the SARS-CoV-2 public health emergency.  Safety protocols were in place, including screening questions prior to the visit, additional usage of staff PPE, and extensive cleaning of exam  room while observing appropriate contact time as indicated for disinfecting solutions.

## 2021-02-24 NOTE — Patient Instructions (Signed)
Please return in 3 months for your annual complete physical.   I will release your lab results to you on your MyChart account with further instructions. You may see the results before I do, but when I review them I will send you a message with my report or have my assistant call you if things need to be discussed. Please reply to my message with any questions. Thank you!   If you have any questions or concerns, please don't hesitate to send me a message via MyChart or call the office at [phone]. Thank you for visiting with [place] today! It's our pleasure caring for you.

## 2021-03-07 DIAGNOSIS — Z79899 Other long term (current) drug therapy: Secondary | ICD-10-CM | POA: Diagnosis not present

## 2021-03-07 DIAGNOSIS — E1169 Type 2 diabetes mellitus with other specified complication: Secondary | ICD-10-CM

## 2021-03-07 DIAGNOSIS — I10 Essential (primary) hypertension: Secondary | ICD-10-CM

## 2021-03-07 DIAGNOSIS — Z7984 Long term (current) use of oral hypoglycemic drugs: Secondary | ICD-10-CM

## 2021-03-07 DIAGNOSIS — Z6838 Body mass index (BMI) 38.0-38.9, adult: Secondary | ICD-10-CM | POA: Diagnosis not present

## 2021-03-07 DIAGNOSIS — M545 Low back pain, unspecified: Secondary | ICD-10-CM | POA: Diagnosis not present

## 2021-03-10 DIAGNOSIS — F332 Major depressive disorder, recurrent severe without psychotic features: Secondary | ICD-10-CM | POA: Diagnosis not present

## 2021-03-10 DIAGNOSIS — F411 Generalized anxiety disorder: Secondary | ICD-10-CM | POA: Diagnosis not present

## 2021-03-10 DIAGNOSIS — F4312 Post-traumatic stress disorder, chronic: Secondary | ICD-10-CM | POA: Diagnosis not present

## 2021-03-16 ENCOUNTER — Telehealth: Payer: Self-pay | Admitting: Pharmacist

## 2021-03-16 NOTE — Progress Notes (Signed)
? ? ?Chronic Care Management ?Pharmacy Assistant  ? ?Name: Stacy Campbell  MRN: [id number] DOB: 08-Dec-1955 ? ? ?Reason for Encounter: General Adherence Call ?  ? ?Recent office visits:  ?02/24/2021 [facility] (PCP) [name], MD; Diabetes is currently very well controlled, Can consider stopping the metformin if remains controlled.  Blood pressures running borderline low.  Becomes symptomatic, can decrease medications ? ?Recent consult visits:  ?02/13/2021 [facility] (Dermatology) Merleen Nicely, MD; continue current medications, add nystatin ointment ? ?Hospital visits:  ?None in previous 6 months ? ?Medications: ?Outpatient Encounter Medications as of 03/16/2021  ?Medication Sig  ? albuterol (VENTOLIN HFA) 108 (90 Base) MCG/ACT inhaler INHALE 2 PUFFS INTO THE LUNGS EVERY 6 HOURS AS NEEDED FOR WHEEZING OR SHORTNESS OF BREATH  ? amLODipine (NORVASC) 10 MG tablet TAKE 1 TABLET(10 MG) BY MOUTH DAILY  ? ARIPiprazole (ABILIFY) 5 MG tablet Take 1 tablet (5 mg total) by mouth daily.  ? ARNUITY ELLIPTA 100 MCG/ACT AEPB INHALE 1 PUFF INTO THE LUNGS DAILY  ? busPIRone (BUSPAR) 5 MG tablet Take 5 mg by mouth 2 (two) times daily.  ? carvedilol (COREG) 25 MG tablet TAKE 1 TABLET(25 MG) BY MOUTH TWICE DAILY WITH A MEAL  ? desvenlafaxine (PRISTIQ) 50 MG 24 hr tablet Take 50 mg by mouth daily.  ? doxepin (SINEQUAN) 25 MG capsule Take 25 mg by mouth 3 (three) times daily as needed.  ? EPINEPHrine 0.3 mg/0.3 mL IJ SOAJ injection Use as directed for severe allergic reaction  ? fluconazole (DIFLUCAN) 200 MG tablet Take 1 pill once a week for 12 weeks.  ? FLUoxetine (PROZAC) 20 MG capsule Take 1 capsule (20 mg total) by mouth daily.  ? glucose blood test strip Test blood sugar daily and record on blood sugar log  ? JARDIANCE 10 MG TABS tablet TAKE 1 TABLET(10 MG) BY MOUTH DAILY  ? Lancets (ACCU-CHEK SOFT TOUCH) lancets Check blood sugar daily and record in log  ? lisinopril-hydrochlorothiazide (ZESTORETIC) 20-25 MG tablet TAKE 1 TABLET  BY MOUTH DAILY  ? meclizine (ANTIVERT) 25 MG tablet Take 1 tablet (25 mg total) by mouth 3 (three) times daily as needed for dizziness.  ? metFORMIN (GLUCOPHAGE-XR) 500 MG 24 hr tablet TAKE 1 TABLET(500 MG) BY MOUTH DAILY WITH BREAKFAST  ? mycophenolate (CELLCEPT) 500 MG tablet Take 1 tablet by mouth 2 (two) times daily. Written by derm  ? nystatin cream (MYCOSTATIN) APPLY TOPICALLY TO THE AFFECTED AREA TWICE DAILY FOR 7 DAYS THEN AS NEEDED  ? olopatadine (PATANOL) 0.1 % ophthalmic solution Place 1 drop into both eyes 2 (two) times daily.  ? rosuvastatin (CRESTOR) 10 MG tablet TAKE 1 TABLET(10 MG) BY MOUTH DAILY  ? tiZANidine (ZANAFLEX) 4 MG tablet Take 4 mg by mouth 2 (two) times daily.  ? triamcinolone ointment (KENALOG) 0.1 % Apply 1 application topically 2 (two) times daily.  ? TRULICITY 0.24 OX/7.3ZH SOPN ADMINISTER 0.75 MG UNDER THE SKIN 1 TIME A WEEK  ? zolpidem (AMBIEN) 10 MG tablet Take 10 mg by mouth at bedtime.  ? ?No facility-administered encounter medications on file as of 03/16/2021.  ? ?Patient Questions: ?Have you had any problems recently with your health? ?Patient states she has not had any problems recently with her health. ? ?She reports recent blood pressure readings as 120/?? She did not have a diastolic number but states "the top number" has been in the 120s. ? ?She reports her most recent blood glucose as 121. ? ?Have you had any problems with  your pharmacy? ?Patient denies having any problems with her pharmacy. ? ?What issues or side effects are you having with your medications? ?Patient denies any issues or side effects with any of her medications. ? ?What would you like me to pass along to Leata Mouse, CPP for him to help you with?  ?Patient states she does not have anything for me to pass along at this time. ? ?What can we do to take care of you better? ?Patient does not have any suggestions. She is happy with her current level of care. ? ?Care Gaps: ?Medicare Annual Wellness:  Completed ?Ophthalmology Exam: Next due on 04/12/2021 ?Foot Exam: Next due on 11/18/2021 ?Hemoglobin A1C: 6.6% on 02/24/2021 ?Colonoscopy: Next due on 12/14/2029 ?Dexa Scan: Completed ?Mammogram: Next due on 04/29/2021 ? ?Future Appointments  ?Date Time Provider [place]  ?03/21/2021 10:00 AM [facility] CARE [facility]  ?04/25/2021 11:45 AM Scotece, Francesco Sor, RD [place] [facility]  ?06/12/2021 11:00 AM [name], MD [facility]  ?07/28/2021  2:30 PM [facility] HEALTH COACH [facility]  ? ?Star Rating Drugs: ?Trulicity last filled 48/18/5631 28 DS ?Jardiance 10 mg last filled 02/06/2021 90 DS ?Rosuvastatin 10 mg last filled 01/06/2021 90 DS ?Metformin ER 500 mg last filled 11/28/2020 90 DS ? ?April D Calhoun, [place] ?Clinical Pharmacist Assistant ?[phone]  ?

## 2021-03-21 ENCOUNTER — Ambulatory Visit (INDEPENDENT_AMBULATORY_CARE_PROVIDER_SITE_OTHER): Payer: Medicare HMO | Admitting: *Deleted

## 2021-03-21 DIAGNOSIS — I10 Essential (primary) hypertension: Secondary | ICD-10-CM

## 2021-03-21 DIAGNOSIS — E119 Type 2 diabetes mellitus without complications: Secondary | ICD-10-CM

## 2021-03-21 NOTE — Patient Instructions (Signed)
Visit Information ? ?Thank you for taking time to visit with me today. Please don't hesitate to contact me if I can be of assistance to you before our next scheduled telephone appointment. ? ?Following are the goals we discussed today:  ? ?Take medications as prescribed   ?Attend all scheduled provider appointments ?check blood sugar at prescribed times: twice daily ?take the blood sugar log to all doctor visits ?drink 6 to 8 glasses of water each day ?check blood pressure 3 times per week ?write blood pressure results in a log or diary ?learn about high blood pressure ?take blood pressure log to all doctor appointments ?call doctor for signs and symptoms of high blood pressure ?develop an action plan for high blood pressure ?keep all doctor appointments ?take medications for blood pressure exactly as prescribed ? ?Our next appointment is by telephone on 4/25 at 1000 ? ?Please call the care guide team at [phone] if you need to cancel or reschedule your appointment.  ? ?If you are experiencing a Mental Health or [place] or need someone to talk to, please call the Suicide and Crisis Lifeline: 988 ?call the [place] National Suicide Prevention Lifeline: 606-768-0802 or TTY: 947-332-7526 TTY 2040466319) to talk to a trained counselor ?call 1-800-273-TALK (toll free, 24 hour hotline) ?go to [facility] [address] [place] [phone]) ?call 911  ? ?Patient verbalizes understanding of instructions and care plan provided today and agrees to view in [place]. Active MyChart status confirmed with patient.   ? ?Hubert Azure RN, MSN ?RN Care Management Coordinator ?[place] ?[phone] ?[email or web address] ? ?

## 2021-03-21 NOTE — Chronic Care Management (AMB) (Signed)
?Chronic Care Management  ? ?CCM RN Visit Note ? ?03/21/2021 ?Name: Stacy Campbell MRN: [id number] DOB: 08/30/1955 ? ?Subjective: ?Stacy Campbell is a 66 y.o. year old female who is a primary care patient of [name], MD. The care management team was consulted for assistance with disease management and care coordination needs.   ? ?Engaged with patient by telephone for follow up visit in response to provider referral for case management and/or care coordination services.  ? ?Consent to Services:  ?The patient was given information about Chronic Care Management services, agreed to services, and gave verbal consent prior to initiation of services.  Please see initial visit note for detailed documentation.  ? ?Patient agreed to services and verbal consent obtained.  ? ?Assessment: Review of patient past medical history, allergies, medications, health status, including review of consultants reports, laboratory and other test data, was performed as part of comprehensive evaluation and provision of chronic care management services.  ? ?SDOH (Social Determinants of Health) assessments and interventions performed:   ? ?CCM Care Plan ? ?Allergies  ?Allergen Reactions  ? Hydrocodone Itching  ? Linalool   ? Methylisothiazolinone Other (See Comments)  ?  Other reaction(s): Other (See Comments) ?Positive patch test ?Positive patch test ?  ? Propylene Glycol   ? Aspirin Itching and Other (See Comments)  ?  Lower dose doesn't make her itch (she takes it every day)  ? Eggs Or Egg-Derived Products Nausea Only and Other (See Comments)  ?  No reaction if in another food  ? Hydrocodone-Guaifenesin Rash  ? ? ?Outpatient Encounter Medications as of 03/21/2021  ?Medication Sig  ? albuterol (VENTOLIN HFA) 108 (90 Base) MCG/ACT inhaler INHALE 2 PUFFS INTO THE LUNGS EVERY 6 HOURS AS NEEDED FOR WHEEZING OR SHORTNESS OF BREATH  ? amLODipine (NORVASC) 10 MG tablet TAKE 1 TABLET(10 MG) BY MOUTH DAILY  ? ARIPiprazole (ABILIFY) 5  MG tablet Take 1 tablet (5 mg total) by mouth daily.  ? ARNUITY ELLIPTA 100 MCG/ACT AEPB INHALE 1 PUFF INTO THE LUNGS DAILY  ? busPIRone (BUSPAR) 5 MG tablet Take 5 mg by mouth 2 (two) times daily.  ? carvedilol (COREG) 25 MG tablet TAKE 1 TABLET(25 MG) BY MOUTH TWICE DAILY WITH A MEAL  ? desvenlafaxine (PRISTIQ) 50 MG 24 hr tablet Take 50 mg by mouth daily.  ? doxepin (SINEQUAN) 25 MG capsule Take 25 mg by mouth 3 (three) times daily as needed.  ? EPINEPHrine 0.3 mg/0.3 mL IJ SOAJ injection Use as directed for severe allergic reaction  ? fluconazole (DIFLUCAN) 200 MG tablet Take 1 pill once a week for 12 weeks.  ? FLUoxetine (PROZAC) 20 MG capsule Take 1 capsule (20 mg total) by mouth daily.  ? glucose blood test strip Test blood sugar daily and record on blood sugar log  ? JARDIANCE 10 MG TABS tablet TAKE 1 TABLET(10 MG) BY MOUTH DAILY  ? Lancets (ACCU-CHEK SOFT TOUCH) lancets Check blood sugar daily and record in log  ? lisinopril-hydrochlorothiazide (ZESTORETIC) 20-25 MG tablet TAKE 1 TABLET BY MOUTH DAILY  ? meclizine (ANTIVERT) 25 MG tablet Take 1 tablet (25 mg total) by mouth 3 (three) times daily as needed for dizziness.  ? metFORMIN (GLUCOPHAGE-XR) 500 MG 24 hr tablet TAKE 1 TABLET(500 MG) BY MOUTH DAILY WITH BREAKFAST  ? mycophenolate (CELLCEPT) 500 MG tablet Take 1 tablet by mouth 2 (two) times daily. Written by derm  ? nystatin cream (MYCOSTATIN) APPLY TOPICALLY TO THE AFFECTED AREA TWICE DAILY FOR 7  DAYS THEN AS NEEDED  ? olopatadine (PATANOL) 0.1 % ophthalmic solution Place 1 drop into both eyes 2 (two) times daily.  ? rosuvastatin (CRESTOR) 10 MG tablet TAKE 1 TABLET(10 MG) BY MOUTH DAILY  ? tiZANidine (ZANAFLEX) 4 MG tablet Take 4 mg by mouth 2 (two) times daily.  ? triamcinolone ointment (KENALOG) 0.1 % Apply 1 application topically 2 (two) times daily.  ? TRULICITY 7.01 XB/9.3JQ SOPN ADMINISTER 0.75 MG UNDER THE SKIN 1 TIME A WEEK  ? zolpidem (AMBIEN) 10 MG tablet Take 10 mg by mouth at bedtime.   ? ?No facility-administered encounter medications on file as of 03/21/2021.  ? ? ?Patient Active Problem List  ? Diagnosis Date Noted  ? 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 Cirrhosis of liver ([place]) - h/o hep C 01/04/2020  ? Degenerative joint disease (DJD) of lumbar spine 01/04/2020  ? Monoclonal gammopathy of unknown significance (MGUS) 01/23/2019  ? Osteoporosis with current pathological fracture 01/21/2019  ? Controlled type 2 diabetes mellitus without complication, without long-term current use of insulin ([place]) 12/12/2018  ? Hyperlipidemia associated with type 2 diabetes mellitus ([place]) 12/12/2018  ? Seasonal and perennial allergic rhinoconjunctivitis 02/12/2018  ? History of hepatitis C 10/11/2017  ? History of atrial fibrillation 10/11/2017  ? Former smoker 10/11/2017  ? Dyshidrotic eczema 09/10/2017  ? GERD (gastroesophageal reflux disease) 02/15/2017  ? Psoriasis 07/10/2015  ? Asthma with COPD ([place]) 05/25/2013  ? 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  ? Class 2 severe obesity due to excess calories with serious comorbidity in adult, unspecified BMI ([place])   ? Major depression, recurrent, chronic ([place]) 03/07/2006  ? Essential hypertension 03/07/2006  ? Chronic low back pain 03/07/2006  ? Insomnia 03/07/2006  ? ? ?Conditions to be addressed/monitored:HTN and DMII ? ?Care Plan : [facility]  General Plan of Care (Adult)  ?Updates made by Leona Singleton, RN since 03/21/2021 12:00 AM  ?  ? ?Problem: Knowledge deficit related to self care management of chronic medical conditions   ?Priority: Medium  ?  ? ?Long-Range Goal: Patient will work wth CCM team to gain knowledge for management of chronic medical conditions   ?Start Date: 11/29/2020  ?Expected End Date: 11/29/2021  ?Recent Progress: On track  ?Priority: Medium  ?Note:   ?Current Barriers:  ?Knowledge Deficits related to plan of care for management of HTN and DMII  ?Chronic Disease Management support and education needs related to HTN and DMII  Patient reporting she continues  to  do well; feels great.   Continues to attend Diabetes and Nutrition classes.  Fasting blood  sugar this morning was 103 with recent ranges of 120-130's  Reports blood pressures have ranged 100-130/70's.  Denies any hypo, hyperglysemia or hypo/hypertension, chest pains or SOB. ? ?RNCM Clinical Goal(s):  ?Patient will verbalize basic understanding of HTN and DMII disease process and self health management plan as evidenced by monitoring blood pressure and blood sugars logging for provider review ?take all medications exactly as prescribed and will call provider for medication related questions as evidenced by medication compliance    ?demonstrate ongoing health management independence as evidenced by decreasing Hgb A1C by 0.3  points and continued weight loss        through collaboration with RN Care manager, provider, and care team.  ? ?Interventions: ?1:1 collaboration with primary care provider regarding development and update of comprehensive plan of care as evidenced by provider attestation and co-signature ?Inter-disciplinary care team collaboration (see longitudinal plan of care) ?Evaluation of current treatment plan related  to  self management and patient's adherence to plan as established by provider ? ? ?Diabetes:  (Status: Goal on Track (progressing): YES.) Long Term Goal  ? ?Lab Results  ?Component Value Date  ? HGBA1C 6.6 (A) 02/24/2021  ?Assessed patient's understanding of A1c goal: <7% ?Provided education to patient about basic DM disease process; ?Reviewed medications with patient and discussed importance of medication adherence;        ?Counseled on importance of regular laboratory monitoring as prescribed;        ?Discussed plans with patient for ongoing care management follow up and provided patient with direct contact information for care management team;      ?Provided patient with written educational materials related to hypo and hyperglycemia and importance of correct treatment;        ?Advised patient, providing education and rationale, to check cbg twice daily and record        ?call provider for findings outside established parameters;       ?Review of patient status, including review o

## 2021-03-24 DIAGNOSIS — Z0100 Encounter for examination of eyes and vision without abnormal findings: Secondary | ICD-10-CM | POA: Diagnosis not present

## 2021-03-24 DIAGNOSIS — E119 Type 2 diabetes mellitus without complications: Secondary | ICD-10-CM | POA: Diagnosis not present

## 2021-03-24 LAB — HM DIABETES EYE EXAM

## 2021-03-26 ENCOUNTER — Other Ambulatory Visit: Payer: Self-pay | Admitting: Family Medicine

## 2021-03-26 DIAGNOSIS — J449 Chronic obstructive pulmonary disease, unspecified: Secondary | ICD-10-CM

## 2021-03-28 ENCOUNTER — Other Ambulatory Visit: Payer: Self-pay | Admitting: Family Medicine

## 2021-03-28 DIAGNOSIS — Z1231 Encounter for screening mammogram for malignant neoplasm of breast: Secondary | ICD-10-CM

## 2021-04-04 DIAGNOSIS — Z6838 Body mass index (BMI) 38.0-38.9, adult: Secondary | ICD-10-CM | POA: Diagnosis not present

## 2021-04-04 DIAGNOSIS — M545 Low back pain, unspecified: Secondary | ICD-10-CM | POA: Diagnosis not present

## 2021-04-04 DIAGNOSIS — Z79899 Other long term (current) drug therapy: Secondary | ICD-10-CM | POA: Diagnosis not present

## 2021-04-07 DIAGNOSIS — Z7984 Long term (current) use of oral hypoglycemic drugs: Secondary | ICD-10-CM

## 2021-04-07 DIAGNOSIS — I10 Essential (primary) hypertension: Secondary | ICD-10-CM

## 2021-04-07 DIAGNOSIS — E1169 Type 2 diabetes mellitus with other specified complication: Secondary | ICD-10-CM | POA: Diagnosis not present

## 2021-04-13 ENCOUNTER — Encounter: Payer: Self-pay | Admitting: Family Medicine

## 2021-04-13 DIAGNOSIS — F332 Major depressive disorder, recurrent severe without psychotic features: Secondary | ICD-10-CM | POA: Diagnosis not present

## 2021-04-13 DIAGNOSIS — F411 Generalized anxiety disorder: Secondary | ICD-10-CM | POA: Diagnosis not present

## 2021-04-13 DIAGNOSIS — F4312 Post-traumatic stress disorder, chronic: Secondary | ICD-10-CM | POA: Diagnosis not present

## 2021-04-17 DIAGNOSIS — F4312 Post-traumatic stress disorder, chronic: Secondary | ICD-10-CM | POA: Diagnosis not present

## 2021-04-17 DIAGNOSIS — F411 Generalized anxiety disorder: Secondary | ICD-10-CM | POA: Diagnosis not present

## 2021-04-17 DIAGNOSIS — F332 Major depressive disorder, recurrent severe without psychotic features: Secondary | ICD-10-CM | POA: Diagnosis not present

## 2021-04-18 ENCOUNTER — Telehealth: Payer: Self-pay | Admitting: Pharmacist

## 2021-04-18 NOTE — Progress Notes (Signed)
? ? ?Chronic Care Management ?Pharmacy Assistant  ? ?Name: Stacy Campbell  MRN: [id number] DOB: [birth year] ? ?Reason for Encounter: General Adherence Call ?  ? ?Recent office visits:  ?02/24/2021 OV (PCP) [name], MD; Can consider stopping the metformin if remains controlled, Blood pressures running borderline low.  Becomes symptomatic, can decrease medications ? ?Recent consult visits:  ?None ? ?Hospital visits:  ?None in previous 6 months ? ?Medications: ?Outpatient Encounter Medications as of 04/18/2021  ?Medication Sig  ? albuterol (VENTOLIN HFA) 108 (90 Base) MCG/ACT inhaler INHALE 2 PUFFS INTO THE LUNGS EVERY 6 HOURS AS NEEDED FOR WHEEZING OR SHORTNESS OF BREATH  ? amLODipine (NORVASC) 10 MG tablet TAKE 1 TABLET(10 MG) BY MOUTH DAILY  ? ARIPiprazole (ABILIFY) 5 MG tablet Take 1 tablet (5 mg total) by mouth daily.  ? ARNUITY ELLIPTA 100 MCG/ACT AEPB INHALE 1 PUFF INTO THE LUNGS DAILY  ? busPIRone (BUSPAR) 5 MG tablet Take 5 mg by mouth 2 (two) times daily.  ? carvedilol (COREG) 25 MG tablet TAKE 1 TABLET(25 MG) BY MOUTH TWICE DAILY WITH A MEAL  ? desvenlafaxine (PRISTIQ) 50 MG 24 hr tablet Take 50 mg by mouth daily.  ? doxepin (SINEQUAN) 25 MG capsule Take 25 mg by mouth 3 (three) times daily as needed.  ? EPINEPHrine 0.3 mg/0.3 mL IJ SOAJ injection Use as directed for severe allergic reaction  ? fluconazole (DIFLUCAN) 200 MG tablet Take 1 pill once a week for 12 weeks.  ? FLUoxetine (PROZAC) 20 MG capsule Take 1 capsule (20 mg total) by mouth daily.  ? glucose blood test strip Test blood sugar daily and record on blood sugar log  ? JARDIANCE 10 MG TABS tablet TAKE 1 TABLET(10 MG) BY MOUTH DAILY  ? Lancets (ACCU-CHEK SOFT TOUCH) lancets Check blood sugar daily and record in log  ? lisinopril-hydrochlorothiazide (ZESTORETIC) 20-25 MG tablet TAKE 1 TABLET BY MOUTH DAILY  ? meclizine (ANTIVERT) 25 MG tablet Take 1 tablet (25 mg total) by mouth 3 (three) times daily as needed for dizziness.  ?  metFORMIN (GLUCOPHAGE-XR) 500 MG 24 hr tablet TAKE 1 TABLET(500 MG) BY MOUTH DAILY WITH BREAKFAST  ? mycophenolate (CELLCEPT) 500 MG tablet Take 1 tablet by mouth 2 (two) times daily. Written by derm  ? nystatin cream (MYCOSTATIN) APPLY TOPICALLY TO THE AFFECTED AREA TWICE DAILY FOR 7 DAYS THEN AS NEEDED  ? olopatadine (PATANOL) 0.1 % ophthalmic solution Place 1 drop into both eyes 2 (two) times daily.  ? rosuvastatin (CRESTOR) 10 MG tablet TAKE 1 TABLET(10 MG) BY MOUTH DAILY  ? tiZANidine (ZANAFLEX) 4 MG tablet Take 4 mg by mouth 2 (two) times daily.  ? triamcinolone ointment (KENALOG) 0.1 % Apply 1 application topically 2 (two) times daily.  ? TRULICITY 4.49 EE/1.0OF SOPN ADMINISTER 0.75 MG UNDER THE SKIN 1 TIME A WEEK  ? zolpidem (AMBIEN) 10 MG tablet Take 10 mg by mouth at bedtime.  ? ?No facility-administered encounter medications on file as of 04/18/2021.  ? ?Patient Questions: ?Have you had any problems recently with your health? ?Patient denies having any problems recently with her health. ? ?She did say that her brother recently passed away. Patient states she found her brother deceased on the floor. ? ?Have you had any problems with your pharmacy? ?Patient denies having any problems with her pharmacy. ? ?What issues or side effects are you having with your medications? ?Patient denies having any issues or side effects with any of her medications. ? ?What would you  like me to pass along to Leata Mouse, CPP for him to help you with?  ?Patient does not have anything for me to pass along at this time. ? ?What can we do to take care of you better? ?Patient does not have any suggestions at this time. ? ?Care Gaps: ?Medicare Annual Wellness: Completed 07/2020 ?Ophthalmology Exam: Next due on 04/12/2021 ?Foot Exam: Next due on 11/18/2021 ?Hemoglobin A1C: 6.6% on 12/24/2021 ?Colonoscopy: Next fue on 12/14/2029 ?Dexa Scan: Completed ?Mammogram: Next due on 04/29/2021 ? ?Future Appointments  ?Date Time Provider [place]  ?04/25/2021 11:45 AM Scote[place], Francesco Sor, RD [facility] NDM  ?05/01/2021 11:10 AM [facility] MM 2 [facility]-BCG[facility] GI-BREAST [facility]  ?05/02/2021 10:00 AM [facility] HPC-CCM CARE MGR [facility] PEC  ?06/12/2021 11:00 AM [name], MD [facility] P[facility]  ?07/28/2021  2:30 PM [facility] HEALTH COACH [facility] PEC  ? ?Star Rating Drugs: ?Jardiance 10 mg last filled 02/06/2021 90 DS ?Lisinopril-HCTZ 20-25 mg last filled 04/04/2021 90 DS ?Rosuvastatin 10 mg last filled 04/04/2021 90 DS ?Metformin 500 mg last filled 11/28/2020 90 DS ? ?April D Calhoun, [place] ?Clinical Pharmacist Assistant ?[phone] ?

## 2021-04-25 ENCOUNTER — Ambulatory Visit: Payer: Medicare HMO | Admitting: Skilled Nursing Facility1

## 2021-05-01 ENCOUNTER — Ambulatory Visit: Payer: Medicare HMO

## 2021-05-02 ENCOUNTER — Ambulatory Visit (INDEPENDENT_AMBULATORY_CARE_PROVIDER_SITE_OTHER): Payer: Medicare HMO | Admitting: *Deleted

## 2021-05-02 DIAGNOSIS — I10 Essential (primary) hypertension: Secondary | ICD-10-CM

## 2021-05-02 DIAGNOSIS — E119 Type 2 diabetes mellitus without complications: Secondary | ICD-10-CM

## 2021-05-02 NOTE — Patient Instructions (Addendum)
Visit Information  ? ?Thank you for taking time to visit with me today. Please don't hesitate to contact me if I can be of assistance to you before our next scheduled telephone appointment. ? ?Following are the goals we discussed today:  ?Take medications as prescribed   ?Attend all scheduled provider appointments ?check blood sugar at prescribed times: twice daily ?take the blood sugar log to all doctor visits ?drink 6 to 8 glasses of water each day ?check blood pressure 3 times per week ?write blood pressure results in a log or diary ?learn about high blood pressure ?take blood pressure log to all doctor appointments ?call doctor for signs and symptoms of high blood pressure ?develop an action plan for high blood pressure ?keep all doctor appointments ?take medications for blood pressure exactly as prescribed ? ?Our next appointment is by telephone on 5/30 at 1000 ? ?Please call the care guide team at [phone] if you need to cancel or reschedule your appointment.  ? ?If you are experiencing a Mental Health or [place] or need someone to talk to, please call the Suicide and Crisis Lifeline: 988 ?call the [place] National Suicide Prevention Lifeline: 434-481-6464 or TTY: 4797136093 TTY 7733959780) to talk to a trained counselor ?call 1-800-273-TALK (toll free, 24 hour hotline) ?call 911  ? ?Following is a copy of your full care plan:  ?There are no care plans that you recently modified to display for this patient. ? ? ?Consent to CCM Services: ?Stacy Campbell was given information about Chronic Care Management services including:  ?CCM service includes personalized support from designated clinical staff supervised by her physician, including individualized plan of care and coordination with other care providers ?24/7 contact phone numbers for assistance for urgent and routine care needs. ?Service will only be billed when office clinical staff spend 20 minutes or more in a month to coordinate  care. ?Only one practitioner may furnish and bill the service in a calendar month. ?The patient may stop CCM services at any time (effective at the end of the month) by phone call to the office staff. ?The patient will be responsible for cost sharing (co-pay) of up to 20% of the service fee (after annual deductible is met). ? ?Patient agreed to services and verbal consent obtained.  ? ?Patient verbalizes understanding of instructions and care plan provided today and agrees to view in [place]. Active MyChart status confirmed with patient.   ? ?The care management team will reach out to the patient again over the next 60 days.  ? ? ?  ?

## 2021-05-02 NOTE — Chronic Care Management (AMB) (Signed)
?Chronic Care Management  ? ?CCM RN Visit Note ? ?05/02/2021 ?Name: Stacy Campbell MRN: [id number] DOB: 01-14-1955 ? ?Subjective: ?Stacy Campbell is a 66 y.o. year old female who is a primary care patient of [name], MD. The care management team was consulted for assistance with disease management and care coordination needs.   ? ?Engaged with patient by telephone for follow up visit in response to provider referral for case management and/or care coordination services.  ? ?Consent to Services:  ?The patient was given information about Chronic Care Management services, agreed to services, and gave verbal consent prior to initiation of services.  Please see initial visit note for detailed documentation.  ? ?Patient agreed to services and verbal consent obtained.  ? ?Assessment: Review of patient past medical history, allergies, medications, health status, including review of consultants reports, laboratory and other test data, was performed as part of comprehensive evaluation and provision of chronic care management services.  ? ?SDOH (Social Determinants of Health) assessments and interventions performed:   ? ?CCM Care Plan ? ?Allergies  ?Allergen Reactions  ? Hydrocodone Itching  ? Linalool   ? Methylisothiazolinone Other (See Comments)  ?  Other reaction(s): Other (See Comments) ?Positive patch test ?Positive patch test ?  ? Propylene Glycol   ? Aspirin Itching and Other (See Comments)  ?  Lower dose doesn't make her itch (she takes it every day)  ? Eggs Or Egg-Derived Products Nausea Only and Other (See Comments)  ?  No reaction if in another food  ? Hydrocodone-Guaifenesin Rash  ? ? ?Outpatient Encounter Medications as of 05/02/2021  ?Medication Sig  ? albuterol (VENTOLIN HFA) 108 (90 Base) MCG/ACT inhaler INHALE 2 PUFFS INTO THE LUNGS EVERY 6 HOURS AS NEEDED FOR WHEEZING OR SHORTNESS OF BREATH  ? amLODipine (NORVASC) 10 MG tablet TAKE 1 TABLET(10 MG) BY MOUTH DAILY  ? ARIPiprazole (ABILIFY) 5  MG tablet Take 1 tablet (5 mg total) by mouth daily.  ? ARNUITY ELLIPTA 100 MCG/ACT AEPB INHALE 1 PUFF INTO THE LUNGS DAILY  ? busPIRone (BUSPAR) 5 MG tablet Take 5 mg by mouth 2 (two) times daily.  ? carvedilol (COREG) 25 MG tablet TAKE 1 TABLET(25 MG) BY MOUTH TWICE DAILY WITH A MEAL  ? desvenlafaxine (PRISTIQ) 50 MG 24 hr tablet Take 50 mg by mouth daily.  ? doxepin (SINEQUAN) 25 MG capsule Take 25 mg by mouth 3 (three) times daily as needed.  ? EPINEPHrine 0.3 mg/0.3 mL IJ SOAJ injection Use as directed for severe allergic reaction  ? fluconazole (DIFLUCAN) 200 MG tablet Take 1 pill once a week for 12 weeks.  ? FLUoxetine (PROZAC) 20 MG capsule Take 1 capsule (20 mg total) by mouth daily.  ? glucose blood test strip Test blood sugar daily and record on blood sugar log  ? JARDIANCE 10 MG TABS tablet TAKE 1 TABLET(10 MG) BY MOUTH DAILY  ? Lancets (ACCU-CHEK SOFT TOUCH) lancets Check blood sugar daily and record in log  ? lisinopril-hydrochlorothiazide (ZESTORETIC) 20-25 MG tablet TAKE 1 TABLET BY MOUTH DAILY  ? meclizine (ANTIVERT) 25 MG tablet Take 1 tablet (25 mg total) by mouth 3 (three) times daily as needed for dizziness.  ? metFORMIN (GLUCOPHAGE-XR) 500 MG 24 hr tablet TAKE 1 TABLET(500 MG) BY MOUTH DAILY WITH BREAKFAST  ? mycophenolate (CELLCEPT) 500 MG tablet Take 1 tablet by mouth 2 (two) times daily. Written by derm  ? nystatin cream (MYCOSTATIN) APPLY TOPICALLY TO THE AFFECTED AREA TWICE DAILY FOR 7  DAYS THEN AS NEEDED  ? olopatadine (PATANOL) 0.1 % ophthalmic solution Place 1 drop into both eyes 2 (two) times daily.  ? rosuvastatin (CRESTOR) 10 MG tablet TAKE 1 TABLET(10 MG) BY MOUTH DAILY  ? tiZANidine (ZANAFLEX) 4 MG tablet Take 4 mg by mouth 2 (two) times daily.  ? triamcinolone ointment (KENALOG) 0.1 % Apply 1 application topically 2 (two) times daily.  ? TRULICITY 0.86 VH/8.4ON SOPN ADMINISTER 0.75 MG UNDER THE SKIN 1 TIME A WEEK  ? zolpidem (AMBIEN) 10 MG tablet Take 10 mg by mouth at bedtime.   ? ?No facility-administered encounter medications on file as of 05/02/2021.  ? ? ?Patient Active Problem List  ? Diagnosis Date Noted  ? 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 Cirrhosis of liver ([place]) - h/o hep C 01/04/2020  ? Degenerative joint disease (DJD) of lumbar spine 01/04/2020  ? Monoclonal gammopathy of unknown significance (MGUS) 01/23/2019  ? Osteoporosis with current pathological fracture 01/21/2019  ? Controlled type 2 diabetes mellitus without complication, without long-term current use of insulin ([place]) 12/12/2018  ? Hyperlipidemia associated with type 2 diabetes mellitus ([place]) 12/12/2018  ? Seasonal and perennial allergic rhinoconjunctivitis 02/12/2018  ? History of hepatitis C 10/11/2017  ? History of atrial fibrillation 10/11/2017  ? Former smoker 10/11/2017  ? Dyshidrotic eczema 09/10/2017  ? GERD (gastroesophageal reflux disease) 02/15/2017  ? Psoriasis 07/10/2015  ? Asthma with COPD ([place]) 05/25/2013  ? 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  ? Class 2 severe obesity due to excess calories with serious comorbidity in adult, unspecified BMI ([place])   ? Major depression, recurrent, chronic ([place]) 03/07/2006  ? Essential hypertension 03/07/2006  ? Chronic low back pain 03/07/2006  ? Insomnia 03/07/2006  ? ? ?Conditions to be addressed/monitored:HTN and DMII ? ?Care Plan : [place]  ?Updates made by Leona Singleton, RN since 05/02/2021 12:00 AM  ?  ? ?Problem: Hypertension, Hyperlipidemia, Diabetes, Atrial Fibrillation, GERD, COPD, Chronic Kidney Disease, Hypothyroidism, Anxiety and Osteoporosis   ?Priority: High  ?  ? ?Long-Range Goal: Disease Management   ?Start Date: 03/09/2020  ?Expected End Date: 03/22/2021  ?Recent Progress: On track  ?Priority: High  ?Note:   ?Current Barriers:  ?Work to optimize lifestyle choices through diet and exercise ? ?Pharmacist Clinical Goal(s):  ?Over the next 365 days, patient will verbalize ability to afford treatment regimen ?achieve adherence to monitoring guidelines  and medication adherence to achieve therapeutic efficacy ?contact provider office for questions/concerns as evidenced notation of same in electronic health record through collaboration with PharmD and provider.  ? ?Interventions: ?1:1 collaboration with [name], MD regarding development and update of comprehensive plan of care as evidenced by provider attestation and co-signature ?Inter-disciplinary care team collaboration (see longitudinal plan of care) ?Comprehensive medication review performed; medication list updated in electronic medical record ? ?Hypertension (BP goal <140/90) ?-Not ideally controlled - improve 4/29 visit  ?--Current treatment: ?Lisinopril-HCTZ 20-25 mg (resumed) ?Amlodipine 10 mg once daily (started 02/15/2020) ?Carvedilol 25 mg twice daily  ?-Current home readings: has BP cuff through [place] - at goal.  ?-Denies hypotensive/hypertensive symptoms ?-Educated on BP goals and benefits of medications for prevention of heart attack, stroke and kidney damage; ?Daily salt intake goal < 2300 mg; ?Exercise goal of 150 minutes per week; ?Importance of home blood pressure monitoring; ?Proper BP monitoring technique; ?Symptoms of hypotension and importance of maintaining adequate hydration; ?-Counseled to monitor BP at home twice daily, document, and provide log at future appointments ?-Counseled on diet and exercise extensively ? ?  Update 10/10/20 ?BP has been controlled ?No changes to any medications ?Recommended she continue to monitor and report any consistent elevations or symptoms to providers. ?  ?Asthma with COPD (Goal: control symptoms and prevent exacerbations) ?-Controlled ?-Current treatment  ?Ventolin HFA 2 puffs every 6 hours as needed for shortness of breath ?Arnuity Ellipta 100 mcg/act - 1 puff into lungs daily ?-Exacerbations requiring treatment in last 6 months: 0 ?-Patient reports consistent use of maintenance inhaler ?-Frequency of rescue inhaler use: rare ?-Counseled on [place]  inhaler technique; ?-Recommended to continue current medication  ? ? ?Diabetes (A1c goal <7%) ?-Controlled ?-Current medications: ?Jardiance 10 mg once daily ?Metformin '1000mg'$  twice daily ?Trulicity 0.15

## 2021-05-03 DIAGNOSIS — Z6838 Body mass index (BMI) 38.0-38.9, adult: Secondary | ICD-10-CM | POA: Diagnosis not present

## 2021-05-03 DIAGNOSIS — M545 Low back pain, unspecified: Secondary | ICD-10-CM | POA: Diagnosis not present

## 2021-05-03 DIAGNOSIS — Z79899 Other long term (current) drug therapy: Secondary | ICD-10-CM | POA: Diagnosis not present

## 2021-05-03 DIAGNOSIS — Z0000 Encounter for general adult medical examination without abnormal findings: Secondary | ICD-10-CM | POA: Diagnosis not present

## 2021-05-03 DIAGNOSIS — E559 Vitamin D deficiency, unspecified: Secondary | ICD-10-CM | POA: Diagnosis not present

## 2021-05-05 ENCOUNTER — Ambulatory Visit
Admission: RE | Admit: 2021-05-05 | Discharge: 2021-05-05 | Disposition: A | Discharge status: Home / Self Care | Payer: Medicare HMO | Source: Ambulatory Visit | Attending: Family Medicine | Admitting: Family Medicine

## 2021-05-05 DIAGNOSIS — Z1231 Encounter for screening mammogram for malignant neoplasm of breast: Secondary | ICD-10-CM

## 2021-05-07 DIAGNOSIS — Z87891 Personal history of nicotine dependence: Secondary | ICD-10-CM

## 2021-05-07 DIAGNOSIS — I10 Essential (primary) hypertension: Secondary | ICD-10-CM

## 2021-05-07 DIAGNOSIS — Z7984 Long term (current) use of oral hypoglycemic drugs: Secondary | ICD-10-CM | POA: Diagnosis not present

## 2021-05-07 DIAGNOSIS — J449 Chronic obstructive pulmonary disease, unspecified: Secondary | ICD-10-CM | POA: Diagnosis not present

## 2021-05-07 DIAGNOSIS — E119 Type 2 diabetes mellitus without complications: Secondary | ICD-10-CM | POA: Diagnosis not present

## 2021-05-16 DIAGNOSIS — F4312 Post-traumatic stress disorder, chronic: Secondary | ICD-10-CM | POA: Diagnosis not present

## 2021-05-16 DIAGNOSIS — F332 Major depressive disorder, recurrent severe without psychotic features: Secondary | ICD-10-CM | POA: Diagnosis not present

## 2021-05-16 DIAGNOSIS — F411 Generalized anxiety disorder: Secondary | ICD-10-CM | POA: Diagnosis not present

## 2021-06-01 DIAGNOSIS — Z79899 Other long term (current) drug therapy: Secondary | ICD-10-CM | POA: Diagnosis not present

## 2021-06-01 DIAGNOSIS — Z6839 Body mass index (BMI) 39.0-39.9, adult: Secondary | ICD-10-CM | POA: Diagnosis not present

## 2021-06-01 DIAGNOSIS — F32A Depression, unspecified: Secondary | ICD-10-CM | POA: Diagnosis not present

## 2021-06-01 DIAGNOSIS — E559 Vitamin D deficiency, unspecified: Secondary | ICD-10-CM | POA: Diagnosis not present

## 2021-06-01 DIAGNOSIS — M545 Low back pain, unspecified: Secondary | ICD-10-CM | POA: Diagnosis not present

## 2021-06-06 ENCOUNTER — Ambulatory Visit (INDEPENDENT_AMBULATORY_CARE_PROVIDER_SITE_OTHER): Payer: Medicare HMO | Admitting: *Deleted

## 2021-06-06 DIAGNOSIS — E119 Type 2 diabetes mellitus without complications: Secondary | ICD-10-CM

## 2021-06-06 DIAGNOSIS — I10 Essential (primary) hypertension: Secondary | ICD-10-CM

## 2021-06-06 NOTE — Patient Instructions (Addendum)
Visit Information  Thank you for taking time to visit with me today. Please don't hesitate to contact me if I can be of assistance to you before our next scheduled telephone appointment.  Following are the goals we discussed today:  Take medications as prescribed   Attend all scheduled provider appointments check blood sugar at prescribed times: twice daily take the blood sugar log to all doctor visits drink 6 to 8 glasses of water each day check blood pressure 3 times per week write blood pressure results in a log or diary learn about high blood pressure take blood pressure log to all doctor appointments call doctor for signs and symptoms of high blood pressure develop an action plan for high blood pressure keep all doctor appointments take medications for blood pressure exactly as prescribed  Our next appointment is by telephone on 7/11 at 1000  Please call the care guide team at [phone] if you need to cancel or reschedule your appointment.   If you are experiencing a Mental Health or [place] or need someone to talk to, please call the Suicide and Crisis Lifeline: 988 call the [place] National Suicide Prevention Lifeline: 239-564-1630 or TTY: 613-577-0258 TTY 602-102-5964) to talk to a trained counselor call 1-800-273-TALK (toll free, 24 hour hotline) call 911   Patient verbalizes understanding of instructions and care plan provided today and agrees to view in [place]. Active MyChart status and patient understanding of how to access instructions and care plan via MyChart confirmed with patient.     Hubert Azure RN, MSN RN Care Management Coordinator  [facility] [phone] [email or web address]

## 2021-06-06 NOTE — Chronic Care Management (AMB) (Signed)
Chronic Care Management   CCM RN Visit Note  06/06/2021 Name: Stacy Campbell MRN: [id number] DOB: 1955/08/09  Subjective: Stacy Campbell is a 66 y.o. year old female who is a primary care patient of Stacy Arnt, MD. The care management team was consulted for assistance with disease management and care coordination needs.    Engaged with patient by telephone for follow up visit in response to provider referral for case management and/or care coordination services.   Consent to Services:  The patient was given information about Chronic Care Management services, agreed to services, and gave verbal consent prior to initiation of services.  Please see initial visit note for detailed documentation.   Patient agreed to services and verbal consent obtained.   Assessment: Review of patient past medical history, allergies, medications, health status, including review of consultants reports, laboratory and other test data, was performed as part of comprehensive evaluation and provision of chronic care management services.   SDOH (Social Determinants of Health) assessments and interventions performed:    CCM Care Plan  Allergies  Allergen Reactions   Hydrocodone Itching   Linalool    Methylisothiazolinone Other (See Comments)    Other reaction(s): Other (See Comments) Positive patch test Positive patch test    Propylene Glycol    Aspirin Itching and Other (See Comments)    Lower dose doesn't make her itch (she takes it every day)   Eggs Or Egg-Derived Products Nausea Only and Other (See Comments)    No reaction if in another food   Hydrocodone-Guaifenesin Rash    Outpatient Encounter Medications as of 06/06/2021  Medication Sig   albuterol (VENTOLIN HFA) 108 (90 Base) MCG/ACT inhaler INHALE 2 PUFFS INTO THE LUNGS EVERY 6 HOURS AS NEEDED FOR WHEEZING OR SHORTNESS OF BREATH   amLODipine (NORVASC) 10 MG tablet TAKE 1 TABLET(10 MG) BY MOUTH DAILY   ARIPiprazole (ABILIFY) 5  MG tablet Take 1 tablet (5 mg total) by mouth daily.   ARNUITY ELLIPTA 100 MCG/ACT AEPB INHALE 1 PUFF INTO THE LUNGS DAILY   busPIRone (BUSPAR) 5 MG tablet Take 5 mg by mouth 2 (two) times daily.   carvedilol (COREG) 25 MG tablet TAKE 1 TABLET(25 MG) BY MOUTH TWICE DAILY WITH A MEAL   desvenlafaxine (PRISTIQ) 50 MG 24 hr tablet Take 50 mg by mouth daily.   doxepin (SINEQUAN) 25 MG capsule Take 25 mg by mouth 3 (three) times daily as needed.   EPINEPHrine 0.3 mg/0.3 mL IJ SOAJ injection Use as directed for severe allergic reaction   fluconazole (DIFLUCAN) 200 MG tablet Take 1 pill once a week for 12 weeks.   FLUoxetine (PROZAC) 20 MG capsule Take 1 capsule (20 mg total) by mouth daily.   glucose blood test strip Test blood sugar daily and record on blood sugar log   JARDIANCE 10 MG TABS tablet TAKE 1 TABLET(10 MG) BY MOUTH DAILY   Lancets (ACCU-CHEK SOFT TOUCH) lancets Check blood sugar daily and record in log   lisinopril-hydrochlorothiazide (ZESTORETIC) 20-25 MG tablet TAKE 1 TABLET BY MOUTH DAILY   meclizine (ANTIVERT) 25 MG tablet Take 1 tablet (25 mg total) by mouth 3 (three) times daily as needed for dizziness.   metFORMIN (GLUCOPHAGE-XR) 500 MG 24 hr tablet TAKE 1 TABLET(500 MG) BY MOUTH DAILY WITH BREAKFAST   mycophenolate (CELLCEPT) 500 MG tablet Take 1 tablet by mouth 2 (two) times daily. Written by derm   nystatin cream (MYCOSTATIN) APPLY TOPICALLY TO THE AFFECTED AREA TWICE DAILY FOR 7  DAYS THEN AS NEEDED   olopatadine (PATANOL) 0.1 % ophthalmic solution Place 1 drop into both eyes 2 (two) times daily.   rosuvastatin (CRESTOR) 10 MG tablet TAKE 1 TABLET(10 MG) BY MOUTH DAILY   tiZANidine (ZANAFLEX) 4 MG tablet Take 4 mg by mouth 2 (two) times daily.   triamcinolone ointment (KENALOG) 0.1 % Apply 1 application topically 2 (two) times daily.   TRULICITY 4.65 KP/5.4SF SOPN ADMINISTER 0.75 MG UNDER THE SKIN 1 TIME A WEEK   zolpidem (AMBIEN) 10 MG tablet Take 10 mg by mouth at bedtime.    No facility-administered encounter medications on file as of 06/06/2021.    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   Cirrhosis of liver (Stacy Campbell) - h/o hep C 01/04/2020   Degenerative joint disease (DJD) of lumbar spine 01/04/2020   Monoclonal gammopathy of unknown significance (MGUS) 01/23/2019   Osteoporosis with current pathological fracture 01/21/2019   Controlled type 2 diabetes mellitus without complication, without long-term current use of insulin (Stacy Campbell) 12/12/2018   Hyperlipidemia associated with type 2 diabetes mellitus (Stacy Campbell) 12/12/2018   Seasonal and perennial allergic rhinoconjunctivitis 02/12/2018   History of hepatitis C 10/11/2017   History of atrial fibrillation 10/11/2017   Former smoker 10/11/2017   Dyshidrotic eczema 09/10/2017   GERD (gastroesophageal reflux disease) 02/15/2017   Psoriasis 07/10/2015   Asthma with COPD (Stacy Campbell) 05/25/2013   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   Class 2 severe obesity due to excess calories with serious comorbidity in adult, unspecified BMI (Stacy Campbell)    Major depression, recurrent, chronic (Stacy Campbell) 03/07/2006   Essential hypertension 03/07/2006   Chronic low back pain 03/07/2006   Insomnia 03/07/2006    Conditions to be addressed/monitored:HTN and DMII  Care Plan : Stacy Campbell (Adult)  Updates made by Leona Singleton, RN since 06/06/2021 12:00 AM     Problem: Knowledge deficit related to self care management of chronic medical conditions   Priority: Medium     Long-Range Goal: Patient will work wth CCM team to gain knowledge for management of chronic medical conditions   Start Date: 11/29/2020  Expected End Date: 11/29/2021  Recent Progress: On track  Priority: Medium  Note:   Current Barriers:  Knowledge Deficits related to plan of care for management of HTN and DMII  Chronic Disease Management support and education needs related to HTN and DMII  Patient reporting she continues  to  do well.  Fasting blood  sugar this morning was 109 with recent ranges of 90-120's  Reports blood pressure 127/57.  Denies any hypo, hyperglysemia or hypo/hypertension, chest pains or SOB.  Does report unfortunate death of her brother, whom she found.  Offered referral for grief counseling; declines as she states she already sees counselor.  5/30--reports she is still dealing with brothers death, declines grief counseling and states she will continue working with her psychiatrist and therapist.  Fasting blood sugar this morning 128 with recent ranges of 90-120's.  BP ranges 110-140-80-90.  Concerned about increasing appetite and increasing weight; discussed healthier meal and snack options. Does also acknowledge increasing activity swi,,img twice a week  RNCM Clinical Goal(s):  Patient will verbalize basic understanding of HTN and DMII disease process and self health management plan as evidenced by monitoring blood pressure and blood sugars logging for provider review take all medications exactly as prescribed and will call provider for medication related questions as evidenced by medication compliance    demonstrate ongoing  health management independence as evidenced by decreasing Hgb A1C by 0.3  points and continued weight loss        through collaboration with RN Care manager, provider, and care team.   Interventions: 1:1 collaboration with primary care provider regarding development and update of comprehensive plan of care as evidenced by provider attestation and co-signature Inter-disciplinary care team collaboration (see longitudinal plan of care) Evaluation of current treatment plan related to  self management and patient's adherence to plan as established by provider   Diabetes:  (Status: Goal on Track (progressing): YES.) Long Term Goal   Lab Results  Component Value Date   HGBA1C 6.6 (A) 02/24/2021  Assessed patient's understanding of A1c goal: <7% Provided education to patient  about basic DM disease process; Reviewed medications with patient and discussed importance of medication adherence;        Counseled on importance of regular laboratory monitoring as prescribed;        Discussed plans with patient for ongoing care management follow up and provided patient with direct contact information for care management team;      Provided patient with written educational materials related to hypo and hyperglycemia and importance of correct treatment;       Advised patient, providing education and rationale, to check cbg twice daily and record        call provider for findings outside established parameters;       Review of patient status, including review of consultants reports, relevant laboratory and other test results, and medications completed;       Congratulated on A1C reduction and discussed ways to maintain Encouraged patient to discuss A1C goal with provider Encouraged to continue to increase activity as tolerated Discussed healthier meal and drink options, eating at least 3 meals a day and not skipping meals  Hypertension: (Status: Goal on Track (progressing): YES.)  Long Term Goal Last practice recorded BP readings:  BP Readings from Last 3 Encounters:  02/24/21 110/62  11/18/20 124/70  08/17/20 116/60  Most recent eGFR/CrCl: No results found for: EGFR  No components found for: CRCL  Evaluation of current treatment plan related to hypertension self management and patient's adherence to plan as established by provider;   Provided education to patient re: stroke prevention, s/s of heart attack and stroke; Reviewed prescribed diet low sat carb modified Reviewed medications with patient and discussed importance of compliance;  Discussed plans with patient for ongoing care management follow up and provided patient with direct contact information for care management team; Advised patient, providing education and rationale, to monitor blood pressure daily and  record, calling PCP for findings outside established parameters;  Discussed complications of poorly controlled blood pressure such as heart disease, stroke, circulatory complications, vision complications, kidney impairment, sexual dysfunction;   Empathy and support provided to patient  Patient Goals/Self-Care Activities: Take medications as prescribed   Attend all scheduled provider appointments check blood sugar at prescribed times: twice daily take the blood sugar log to all doctor visits drink 6 to 8 glasses of water each day check blood pressure 3 times per week write blood pressure results in a log or diary learn about high blood pressure take blood pressure log to all doctor appointments call doctor for signs and symptoms of high blood pressure develop an action plan for high blood pressure keep all doctor appointments take medications for blood pressure exactly as prescribed       Plan:The care management team will reach out to the patient again  over the next 45 days.  Hubert Azure RN, MSN RN Care Management Coordinator  [place] [phone] [name]

## 2021-06-07 DIAGNOSIS — Z87891 Personal history of nicotine dependence: Secondary | ICD-10-CM | POA: Diagnosis not present

## 2021-06-07 DIAGNOSIS — E1159 Type 2 diabetes mellitus with other circulatory complications: Secondary | ICD-10-CM

## 2021-06-07 DIAGNOSIS — J45909 Unspecified asthma, uncomplicated: Secondary | ICD-10-CM | POA: Diagnosis not present

## 2021-06-07 DIAGNOSIS — I10 Essential (primary) hypertension: Secondary | ICD-10-CM | POA: Diagnosis not present

## 2021-06-07 DIAGNOSIS — Z7984 Long term (current) use of oral hypoglycemic drugs: Secondary | ICD-10-CM

## 2021-06-12 ENCOUNTER — Encounter: Payer: Self-pay | Admitting: Family Medicine

## 2021-06-12 ENCOUNTER — Ambulatory Visit (INDEPENDENT_AMBULATORY_CARE_PROVIDER_SITE_OTHER): Payer: Medicare HMO | Admitting: Family Medicine

## 2021-06-12 VITALS — BP 110/60 | HR 88 | Temp 98.3°F | Ht 64.0 in | Wt 225.0 lb

## 2021-06-12 DIAGNOSIS — L301 Dyshidrosis [pompholyx]: Secondary | ICD-10-CM | POA: Diagnosis not present

## 2021-06-12 DIAGNOSIS — I10 Essential (primary) hypertension: Secondary | ICD-10-CM | POA: Diagnosis not present

## 2021-06-12 DIAGNOSIS — F339 Major depressive disorder, recurrent, unspecified: Secondary | ICD-10-CM | POA: Diagnosis not present

## 2021-06-12 DIAGNOSIS — K7469 Other cirrhosis of liver: Secondary | ICD-10-CM

## 2021-06-12 DIAGNOSIS — E119 Type 2 diabetes mellitus without complications: Secondary | ICD-10-CM

## 2021-06-12 DIAGNOSIS — L508 Other urticaria: Secondary | ICD-10-CM

## 2021-06-12 DIAGNOSIS — D472 Monoclonal gammopathy: Secondary | ICD-10-CM

## 2021-06-12 DIAGNOSIS — Z0000 Encounter for general adult medical examination without abnormal findings: Secondary | ICD-10-CM

## 2021-06-12 LAB — POCT GLYCOSYLATED HEMOGLOBIN (HGB A1C): Hemoglobin A1C: 6.4 % — AB (ref 4.0–5.6)

## 2021-06-12 NOTE — Patient Instructions (Signed)
Please return in 6 months for recheck ° °If you have any questions or concerns, please don't hesitate to send me a message via MyChart or call the office at [phone]. Thank you for visiting with us today! It's our pleasure caring for you.  °

## 2021-06-12 NOTE — Progress Notes (Signed)
Subjective  Chief Complaint  Patient presents with   Annual Exam    Pt here for Annual Exa ans is not currently fasting    HPI: Stacy Campbell is a 66 y.o. female who presents to [facility] [place] today for a Female Wellness Visit. She also has the concerns and/or needs as listed above in the chief complaint. These will be addressed in addition to the Health Maintenance Visit.   Wellness Visit: annual visit with health maintenance review and exam without Pap  HM: all screens are current. Imms up to date. Still struggling with mood. Also reports she is stress eating and can't lose weight.  Reviewed lab results from February. See below. Chronic disease f/u and/or acute problem visit: (deemed necessary to be done in addition to the wellness visit): Diabetes: sugars are improved. Tolerating meds: jardiance 10 and trulicity 0.75 and metformin 500 xr. weight is unchanged. She is on statin and ACE. Urine screen negative for microalbuminuria. No retinopathy MGUS, urticaria and severe eczema: stable although skin is somewhat flared right now.  HTN is controlled on zestoretic and amlodipine 10. No cp or sob. Depression remains main problems. Seeing psych; reviewed multiple mood meds.  Liver cirrhosis w/o sxs.   Assessment  1. Annual physical exam   2. Controlled type 2 diabetes mellitus without complication, without long-term current use of insulin (HCC)   3. Monoclonal gammopathy of unknown significance (MGUS)   4. Chronic urticaria   5. Major depression, recurrent, chronic (HCC)   6. Essential hypertension   7. Dyshidrotic eczema   8. Other cirrhosis of liver [facility])      Plan  Female Wellness Visit: Age appropriate Health Maintenance and Prevention measures were discussed with patient. Included topics are cancer screening recommendations, ways to keep healthy (see AVS) including dietary and exercise recommendations, regular eye and dental care, use of seat  belts, and avoidance of moderate alcohol use and tobacco use.  BMI: discussed patient's BMI and encouraged positive lifestyle modifications to help get to or maintain a target BMI. HM needs and immunizations were addressed and ordered. See below for orders. See HM and immunization section for updates. Routine labs and screening tests ordered including cmp, cbc and lipids where appropriate. Discussed recommendations regarding Vit D and calcium supplementation (see AVS)  Chronic disease management visit and/or acute problem visit: Diabetes: control is good. Continue current medications. Met, trulicity and jardiance as listed above. Renal function is normal. No retinopathy. On statin and ace. Imms current HTN is controlled on zestoretic and ambien.  Lipids are at goal on crestor 10 nightly.  Liver function tests are stable/cirrhosis Mood: counseling done. Continue mood meds per psych  Derm: active eczema.   Follow up: 6 mo for recheck.   Orders Placed This Encounter  Procedures   POCT HgB A1C   No orders of the defined types were placed in this encounter.     Body mass index is 38.62 kg/m. Wt Readings from Last 3 Encounters:  06/12/21 225 lb (102.1 kg)  02/24/21 225 lb 6.4 oz (102.2 kg)  01/24/21 223 lb 14.4 oz (101.6 kg)     Patient Active Problem List   Diagnosis Date Noted   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 01/04/2020    Priority: High   Monoclonal gammopathy of unknown significance (MGUS) 01/23/2019    Priority: High   Controlled type 2 diabetes mellitus without complication, without long-term current use of insulin (HCC) 12/12/2018  Priority: High    Diagnosed 2020     Hyperlipidemia associated with type 2 diabetes mellitus ([place]) 12/12/2018    Priority: High    LDL goal of 70 given diabetes     Asthma with COPD ([place]) 05/25/2013    Priority: High   Class 2 severe obesity due to excess calories with serious comorbidity in adult, unspecified BMI ([place])      Priority: High   Major depression, recurrent, chronic ([place]) 03/07/2006    Priority: High    Sees psychiatry: Sharon Seller, [place]; [facility] Psychotherapy with Zella Ball, PsyD     Essential hypertension 03/07/2006    Priority: High   Chronic low back pain 03/07/2006    Priority: High   Insomnia 03/07/2006    Priority: High    On chronic Ambien (started by previous provider).      Degenerative joint disease (DJD) of lumbar spine 01/04/2020    Priority: Medium    History of hepatitis C 10/11/2017    Priority: Medium     S/P Treatment with sofosbuvir and simeprivir.  Followed by hep clinic.  Undetectable viral load - Cured.     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   Health Maintenance  Topic Date Due   TETANUS/TDAP  08/17/2021 (Originally 01/11/2019)   INFLUENZA VACCINE  08/08/2021   FOOT EXAM  11/18/2021   HEMOGLOBIN A1C  12/12/2021   OPHTHALMOLOGY EXAM  03/25/2022   MAMMOGRAM  05/06/2022   PAP SMEAR-Modifier  05/07/2023   COLONOSCOPY (Pts 45-69yrs Insurance coverage will need to be confirmed)  12/14/2029   Pneumonia Vaccine 41+ Years old  Completed   DEXA SCAN  Completed   Hepatitis C Screening  Completed   HIV Screening  Completed   HPV VACCINES  Aged Out   COVID-19 Vaccine  Discontinued   Zoster Vaccines- Shingrix  Discontinued   Immunization History  Administered Date(s) Administered   Fluad Quad(high Dose 65+) 11/18/2020   Hepatitis B, adult 10/12/2014, 11/09/2014,  04/11/2015   Influenza Split 11/14/2010, 10/09/2011   Influenza Whole 10/13/2007, 01/10/2009, 11/22/2009   Influenza,inj,Quad PF,6+ Mos 10/10/2012, 10/08/2013, 10/15/2014, 09/29/2015, 11/06/2016, 09/10/2017, 10/15/2018, 09/30/2019   PFIZER(Purple Top)SARS-COV-2 Vaccination 03/20/2019, 04/15/2019, 10/15/2019   PNEUMOCOCCAL CONJUGATE-20 11/18/2020   Pneumococcal Polysaccharide-23 01/10/2009   Td 01/09/1999, 01/10/2009   We updated and reviewed the patient's past history in detail and it is documented below. Allergies: Patient is allergic to hydrocodone, linalool, methylisothiazolinone, propylene glycol, aspirin, eggs or egg-derived products, and hydrocodone-guaifenesin. Past Medical History Patient  has a past medical history of Angio-edema, Anxiety, Arthritis, Asthma, Bone spur, Chronic lower back pain, Cirrhosis of liver ([place]) (12/2018), Depression, Diabetes mellitus without complication ([place]), Eczema, GERD (gastroesophageal reflux disease), Hepatitis C, History of atrial fibrillation without current medication, Hyperplastic polyp of intestine, Hypertension, Insomnia, Osteoporosis, Splenic artery aneurysm ([place]), and Urticaria. Past Surgical History Patient  has a past surgical history that includes Cystoscopy with retrograde pyelogram, ureteroscopy and stent placement (Left,  01/29/2017); Cardioversion (1990s); Cystoscopy w/ ureteral stent placement (Left, 01/29/2017); and Cholecystectomy (N/A, 01/22/2018). Family History: Patient family history includes Asthma in her father and mother; Cancer in her father; Diabetes in her father and mother; Eczema in her father; Hepatitis B in her daughter. Social History:  Patient  reports that she quit smoking about 13 years ago. Her smoking use included cigarettes. She has a 9.25 pack-year smoking history. She has never used smokeless tobacco. She reports that she does not drink alcohol and does not use drugs.  Review of Systems: Constitutional: negative for  fever or malaise Ophthalmic: negative for photophobia, double vision or loss of vision Cardiovascular: negative for chest pain, dyspnea on exertion, or new LE swelling Respiratory: negative for SOB or persistent cough Gastrointestinal: negative for abdominal pain, change in bowel habits or melena Genitourinary: negative for dysuria or gross hematuria, no abnormal uterine bleeding or disharge Musculoskeletal: negative for new gait disturbance or muscular weakness Integumentary: negative for new or persistent rashes, no breast lumps Neurological: negative for TIA or stroke symptoms Psychiatric: negative for SI or delusions Allergic/Immunologic: negative for hives  Patient Care Team    Relationship Specialty Notifications Start End  [name], MD PCP - General Family Medicine  01/04/20   Arta Silence, MD Consulting Physician Gastroenterology  01/04/20   Linward Headland, NP Nurse Practitioner Psychology  01/04/20   Leona Singleton, RN Case Manager   03/01/20    Comment: CCM Clinic RN Care Manager  [phone]  Edythe Clarity, [facility]  Pharmacist  10/10/20     Objective  Vitals: BP 110/60   Pulse 88   Temp 98.3 F (36.8 C)   Ht $R'5\' 4"'fv$  (1.626 m)   Wt 225 lb (102.1 kg)   SpO2 96%   BMI 38.62 kg/m  General:  Well developed, well nourished, no acute distress  Psych:  Alert and orientedx3,flat mood and affect HEENT:  Normocephalic, atraumatic, non-icteric sclera,  supple neck without adenopathy, mass or thyromegaly Cardiovascular:  Normal S1, S2, RRR without gallop, rub or murmur Respiratory:  Good breath sounds bilaterally, CTAB with normal respiratory effort Gastrointestinal: normal bowel sounds, soft, non-tender, no noted masses. No HSM MSK: no deformities, contusions. Joints are without erythema or swelling.  Skin:  Warm, eczema on face  Office Visit on 06/12/2021  Component Date Value Ref Range Status   Hemoglobin A1C 06/12/2021 6.4 (A)  4.0 - 5.6 % Final    Lab  Results  Component Value Date   CHOL 102 02/24/2021   HDL 36.30 (L) 02/24/2021   LDLCALC 27 02/24/2021   TRIG 194.0 (H) 02/24/2021   CHOLHDL 3 02/24/2021    Lab Results  Component Value Date   TSH 3.34 01/04/2020    Reviewed CBC and CMP from April abstracted labs. Nl renal function.   Commons side effects, risks, benefits, and alternatives for medications and treatment plan prescribed today were discussed, and the patient expressed understanding of the given instructions. Patient is instructed to call or message via MyChart if he/she has any questions or concerns regarding our treatment plan. No barriers to understanding were identified. We discussed Red Flag symptoms and signs in detail. Patient expressed understanding regarding what to do in case of urgent or emergency type symptoms.  Medication list was reconciled, printed and provided to the patient in AVS. Patient instructions and summary information was reviewed with the patient as documented in the AVS. This note was prepared with assistance of Dragon voice recognition software. Occasional wrong-word  or sound-a-like substitutions may have occurred due to the inherent limitations of voice recognition software  This visit occurred during the SARS-CoV-2 public health emergency.  Safety protocols were in place, including screening questions prior to the visit, additional usage of staff PPE, and extensive cleaning of exam room while observing appropriate contact time as indicated for disinfecting solutions.

## 2021-07-04 DIAGNOSIS — F32A Depression, unspecified: Secondary | ICD-10-CM | POA: Diagnosis not present

## 2021-07-04 DIAGNOSIS — M545 Low back pain, unspecified: Secondary | ICD-10-CM | POA: Diagnosis not present

## 2021-07-04 DIAGNOSIS — Z79899 Other long term (current) drug therapy: Secondary | ICD-10-CM | POA: Diagnosis not present

## 2021-07-04 DIAGNOSIS — E559 Vitamin D deficiency, unspecified: Secondary | ICD-10-CM | POA: Diagnosis not present

## 2021-07-04 DIAGNOSIS — Z6838 Body mass index (BMI) 38.0-38.9, adult: Secondary | ICD-10-CM | POA: Diagnosis not present

## 2021-07-07 ENCOUNTER — Ambulatory Visit (INDEPENDENT_AMBULATORY_CARE_PROVIDER_SITE_OTHER): Payer: Medicare HMO | Admitting: *Deleted

## 2021-07-07 DIAGNOSIS — E119 Type 2 diabetes mellitus without complications: Secondary | ICD-10-CM

## 2021-07-07 DIAGNOSIS — I10 Essential (primary) hypertension: Secondary | ICD-10-CM

## 2021-07-07 NOTE — Chronic Care Management (AMB) (Signed)
  Care Management   Follow Up Note   07/07/2021 Name: Stacy Campbell MRN: [id number] DOB: [birth year]   Referred by: [name], MD Reason for referral : Case Closure   Successful outreach to patient.  States she is doing well without complaints.  Discussed goals and both agree patient has met goals of the program.  Follow Up Plan: The patient has been provided with contact information for the care management team and has been advised to call with any health-related questions or concerns.  No further follow up required: as personal goals have been met.  Hubert Azure RN, MSN RN Care Management Coordinator  [place] [phone] [email or web address]

## 2021-07-07 NOTE — Patient Instructions (Signed)
CONGRATULATIONS ON COMPLETING YOUR GOALS.  IT AS BEEN A PLEASURE WORKING WITH AND TALKING TO YOU.  IF  NEEDS [facility] IN THE FUTURE PLEASE DO NOT HESITATE TO CONTACT ME  [phone]   Cohen Doleman RN, MSN RN Care Management Coordinator  [facility] [phone] [email or web address]  

## 2021-07-08 ENCOUNTER — Other Ambulatory Visit: Payer: Self-pay | Admitting: Family Medicine

## 2021-07-18 ENCOUNTER — Ambulatory Visit: Payer: Medicare HMO | Admitting: *Deleted

## 2021-07-18 DIAGNOSIS — E119 Type 2 diabetes mellitus without complications: Secondary | ICD-10-CM

## 2021-07-18 DIAGNOSIS — I10 Essential (primary) hypertension: Secondary | ICD-10-CM

## 2021-07-18 NOTE — Patient Instructions (Signed)
CONGRATULATIONS ON COMPLETING YOUR GOALS.  IT AS BEEN A PLEASURE WORKING WITH AND TALKING TO YOU.  IF  NEEDS AR[facility] IN THE FUTURE PLEASE DO NOT HESITATE TO CONTACT ME  [phone]   Takyra Cantrall RN, MSN RN Care Management Coordinator  [facility] [phone] [email or web address]  

## 2021-07-18 NOTE — Chronic Care Management (AMB) (Signed)
  Care Management   Follow Up Note   07/18/2021 Name: Stacy Campbell MRN: [id number] DOB: March 28, 1955   Referred by: [name], MD Reason for referral : Chronic Care Management and Case Closure   Successful outreach to patient.  States she is doing well without complaints.  Discussed goals and both agree patient has met goals of the program.  Follow Up Plan: The patient has been provided with contact information for the care management team and has been advised to call with any health-related questions or concerns.  No further follow up required: as personal goals have been met.  Hubert Azure RN, MSN RN Care Management Coordinator  [place] [phone] [email or web address]

## 2021-07-28 ENCOUNTER — Ambulatory Visit (INDEPENDENT_AMBULATORY_CARE_PROVIDER_SITE_OTHER): Payer: Medicare HMO

## 2021-07-28 DIAGNOSIS — Z0000 Encounter for general adult medical examination without abnormal findings: Secondary | ICD-10-CM | POA: Diagnosis not present

## 2021-07-28 NOTE — Patient Instructions (Signed)
Stacy Campbell , Thank you for taking time to come for your Medicare Wellness Visit. I appreciate your ongoing commitment to your health goals. Please review the following plan we discussed and let me know if I can assist you in the future.   Screening recommendations/referrals: Colonoscopy: done 12/15/19 repeat every 10 years  Mammogram: done 05/05/21 repeat every year  Bone Density: done 04/22/19 repeat every 2 years  Recommended yearly ophthalmology/optometry visit for glaucoma screening and checkup Recommended yearly dental visit for hygiene and checkup  Vaccinations: Influenza vaccine: done 11/18/20 repeat every year  Pneumococcal vaccine: Up to date Tdap vaccine: due and discussed  Shingles vaccine: Shingrix discussed. Please contact your pharmacy for coverage information.    Covid-19:completed 3/12, 4/7, 10/15/19   Advanced directives: Advance directive discussed with you today. I have provided a copy for you to complete at home and have notarized. Once this is complete please bring a copy in to our office so we can scan it into your chart.  Conditions/risks identified: do better with exercise   Next appointment: Follow up in one year for your annual wellness visit    Preventive Care 65 Years and Older, Female Preventive care refers to lifestyle choices and visits with your health care provider that can promote health and wellness. What does preventive care include? A yearly physical exam. This is also called an annual well check. Dental exams once or twice a year. Routine eye exams. Ask your health care provider how often you should have your eyes checked. Personal lifestyle choices, including: Daily care of your teeth and gums. Regular physical activity. Eating a healthy diet. Avoiding tobacco and drug use. Limiting alcohol use. Practicing safe sex. Taking low-dose aspirin every day. Taking vitamin and mineral supplements as recommended by your health care provider. What  happens during an annual well check? The services and screenings done by your health care provider during your annual well check will depend on your age, overall health, lifestyle risk factors, and family history of disease. Counseling  Your health care provider may ask you questions about your: Alcohol use. Tobacco use. Drug use. Emotional well-being. Home and relationship well-being. Sexual activity. Eating habits. History of falls. Memory and ability to understand (cognition). Work and work Statistician. Reproductive health. Screening  You may have the following tests or measurements: Height, weight, and BMI. Blood pressure. Lipid and cholesterol levels. These may be checked every 5 years, or more frequently if you are over 96 years old. Skin check. Lung cancer screening. You may have this screening every year starting at age 32 if you have a 30-pack-year history of smoking and currently smoke or have quit within the past 15 years. Fecal occult blood test (FOBT) of the stool. You may have this test every year starting at age 69. Flexible sigmoidoscopy or colonoscopy. You may have a sigmoidoscopy every 5 years or a colonoscopy every 10 years starting at age 29. Hepatitis C blood test. Hepatitis B blood test. Sexually transmitted disease (STD) testing. Diabetes screening. This is done by checking your blood sugar (glucose) after you have not eaten for a while (fasting). You may have this done every 1-3 years. Bone density scan. This is done to screen for osteoporosis. You may have this done starting at age 109. Mammogram. This may be done every 1-2 years. Talk to your health care provider about how often you should have regular mammograms. Talk with your health care provider about your test results, treatment options, and if necessary, the need  for more tests. Vaccines  Your health care provider may recommend certain vaccines, such as: Influenza vaccine. This is recommended every  year. Tetanus, diphtheria, and acellular pertussis (Tdap, Td) vaccine. You may need a Td booster every 10 years. Zoster vaccine. You may need this after age 79. Pneumococcal 13-valent conjugate (PCV13) vaccine. One dose is recommended after age 81. Pneumococcal polysaccharide (PPSV23) vaccine. One dose is recommended after age 70. Talk to your health care provider about which screenings and vaccines you need and how often you need them. This information is not intended to replace advice given to you by your health care provider. Make sure you discuss any questions you have with your health care provider. Document Released: 01/21/2015 Document Revised: 09/14/2015 Document Reviewed: 10/26/2014 Elsevier Interactive Patient Education  2017 [place] Prevention in the Home Falls can cause injuries. They can happen to people of all ages. There are many things you can do to make your home safe and to help prevent falls. What can I do on the outside of my home? Regularly fix the edges of walkways and driveways and fix any cracks. Remove anything that might make you trip as you walk through a door, such as a raised step or threshold. Trim any bushes or trees on the path to your home. Use bright outdoor lighting. Clear any walking paths of anything that might make someone trip, such as rocks or tools. Regularly check to see if handrails are loose or broken. Make sure that both sides of any steps have handrails. Any raised decks and porches should have guardrails on the edges. Have any leaves, snow, or ice cleared regularly. Use sand or salt on walking paths during winter. Clean up any spills in your garage right away. This includes oil or grease spills. What can I do in the bathroom? Use night lights. Install grab bars by the toilet and in the tub and shower. Do not use towel bars as grab bars. Use non-skid mats or decals in the tub or shower. If you need to sit down in the shower, use a  plastic, non-slip stool. Keep the floor dry. Clean up any water that spills on the floor as soon as it happens. Remove soap buildup in the tub or shower regularly. Attach bath mats securely with double-sided non-slip rug tape. Do not have throw rugs and other things on the floor that can make you trip. What can I do in the bedroom? Use night lights. Make sure that you have a light by your bed that is easy to reach. Do not use any sheets or blankets that are too big for your bed. They should not hang down onto the floor. Have a firm chair that has side arms. You can use this for support while you get dressed. Do not have throw rugs and other things on the floor that can make you trip. What can I do in the kitchen? Clean up any spills right away. Avoid walking on wet floors. Keep items that you use a lot in easy-to-reach places. If you need to reach something above you, use a strong step stool that has a grab bar. Keep electrical cords out of the way. Do not use floor polish or wax that makes floors slippery. If you must use wax, use non-skid floor wax. Do not have throw rugs and other things on the floor that can make you trip. What can I do with my stairs? Do not leave any items on the stairs.  Make sure that there are handrails on both sides of the stairs and use them. Fix handrails that are broken or loose. Make sure that handrails are as long as the stairways. Check any carpeting to make sure that it is firmly attached to the stairs. Fix any carpet that is loose or worn. Avoid having throw rugs at the top or bottom of the stairs. If you do have throw rugs, attach them to the floor with carpet tape. Make sure that you have a light switch at the top of the stairs and the bottom of the stairs. If you do not have them, ask someone to add them for you. What else can I do to help prevent falls? Wear shoes that: Do not have high heels. Have rubber bottoms. Are comfortable and fit you  well. Are closed at the toe. Do not wear sandals. If you use a stepladder: Make sure that it is fully opened. Do not climb a closed stepladder. Make sure that both sides of the stepladder are locked into place. Ask someone to hold it for you, if possible. Clearly mark and make sure that you can see: Any grab bars or handrails. First and last steps. Where the edge of each step is. Use tools that help you move around (mobility aids) if they are needed. These include: Canes. Walkers. Scooters. Crutches. Turn on the lights when you go into a dark area. Replace any light bulbs as soon as they burn out. Set up your furniture so you have a clear path. Avoid moving your furniture around. If any of your floors are uneven, fix them. If there are any pets around you, be aware of where they are. Review your medicines with your doctor. Some medicines can make you feel dizzy. This can increase your chance of falling. Ask your doctor what other things that you can do to help prevent falls. This information is not intended to replace advice given to you by your health care provider. Make sure you discuss any questions you have with your health care provider. Document Released: 10/21/2008 Document Revised: 06/02/2015 Document Reviewed: 01/29/2014 Elsevier Interactive Patient Education  2017 [employer].

## 2021-07-28 NOTE — Progress Notes (Signed)
Virtual Visit via Telephone Note  I connected with  Stacy Campbell on 07/28/21 at  2:15 PM EDT by telephone and verified that I am speaking with the correct person using two identifiers.  Medicare Annual Wellness visit completed telephonically due to Covid-19 pandemic.   Persons participating in this call: This Health Coach and this patient.   Location: Patient: home Provider: office    I discussed the limitations, risks, security and privacy concerns of performing an evaluation and management service by telephone and the availability of in person appointments. The patient expressed understanding and agreed to proceed.  Unable to perform video visit due to video visit attempted and failed and/or patient does not have video capability.   Some vital signs may be absent or patient reported.   Willette Brace, LPN   Subjective:   Stacy Campbell is a 66 y.o. female who presents for Medicare Annual (Subsequent) preventive examination.  Review of Systems     Cardiac Risk Factors include: advanced age (>16mn, >>31women);diabetes mellitus;hypertension;dyslipidemia;obesity (BMI >30kg/m2)     Objective:    There were no vitals filed for this visit. There is no height or weight on file to calculate BMI.     07/28/2021    2:22 PM 10/25/2020    9:11 AM 07/15/2020    2:38 PM 03/08/2020    9:38 AM 08/28/2019   10:01 AM 08/14/2019    8:46 AM 12/24/2018    9:35 AM  Advanced Directives  Does Patient Have a Medical Advance Directive? No No No No No No No  Does patient want to make changes to medical advance directive? Yes (MAU/Ambulatory/Procedural Areas - Information given)        Would patient like information on creating a medical advance directive?  No - Patient declined Yes (MAU/Ambulatory/Procedural Areas - Information given) Yes (MAU/Ambulatory/Procedural Areas - Information given) No - Patient declined No - Patient declined No - Patient declined    Current Medications  (verified) Outpatient Encounter Medications as of 07/28/2021  Medication Sig   albuterol (VENTOLIN HFA) 108 (90 Base) MCG/ACT inhaler INHALE 2 PUFFS INTO THE LUNGS EVERY 6 HOURS AS NEEDED FOR WHEEZING OR SHORTNESS OF BREATH   amLODipine (NORVASC) 10 MG tablet TAKE 1 TABLET(10 MG) BY MOUTH DAILY   ARIPiprazole (ABILIFY) 5 MG tablet Take 1 tablet (5 mg total) by mouth daily.   ARNUITY ELLIPTA 100 MCG/ACT AEPB INHALE 1 PUFF INTO THE LUNGS DAILY   busPIRone (BUSPAR) 5 MG tablet Take 5 mg by mouth 2 (two) times daily.   carvedilol (COREG) 25 MG tablet TAKE 1 TABLET(25 MG) BY MOUTH TWICE DAILY WITH A MEAL   desvenlafaxine (PRISTIQ) 50 MG 24 hr tablet Take 50 mg by mouth daily.   doxepin (SINEQUAN) 25 MG capsule Take 25 mg by mouth 3 (three) times daily as needed.   EPINEPHrine 0.3 mg/0.3 mL IJ SOAJ injection Use as directed for severe allergic reaction   glucose blood test strip Test blood sugar daily and record on blood sugar log   JARDIANCE 10 MG TABS tablet TAKE 1 TABLET(10 MG) BY MOUTH DAILY   Lancets (ACCU-CHEK SOFT TOUCH) lancets Check blood sugar daily and record in log   lisinopril-hydrochlorothiazide (ZESTORETIC) 20-25 MG tablet TAKE 1 TABLET BY MOUTH DAILY   meclizine (ANTIVERT) 25 MG tablet Take 1 tablet (25 mg total) by mouth 3 (three) times daily as needed for dizziness.   metFORMIN (GLUCOPHAGE-XR) 500 MG 24 hr tablet TAKE 1 TABLET(500 MG) BY MOUTH DAILY  WITH BREAKFAST   mycophenolate (CELLCEPT) 500 MG tablet Take 1 tablet by mouth 2 (two) times daily. Written by derm   nystatin cream (MYCOSTATIN) APPLY TOPICALLY TO THE AFFECTED AREA TWICE DAILY FOR 7 DAYS THEN AS NEEDED   olopatadine (PATANOL) 0.1 % ophthalmic solution Place 1 drop into both eyes 2 (two) times daily.   oxyCODONE-acetaminophen (PERCOCET) 10-325 MG tablet Take 1 tablet by mouth 5 (five) times daily as needed.   rosuvastatin (CRESTOR) 10 MG tablet TAKE 1 TABLET(10 MG) BY MOUTH DAILY   tiZANidine (ZANAFLEX) 4 MG tablet Take  4 mg by mouth 2 (two) times daily.   triamcinolone ointment (KENALOG) 0.1 % Apply 1 application topically 2 (two) times daily.   TRULICITY 2.40 XB/3.5HG SOPN ADMINISTER 0.75 MG UNDER THE SKIN 1 TIME A WEEK   zolpidem (AMBIEN) 10 MG tablet Take 10 mg by mouth at bedtime.   [DISCONTINUED] FLUoxetine (PROZAC) 20 MG capsule Take 1 capsule (20 mg total) by mouth daily.   No facility-administered encounter medications on file as of 07/28/2021.    Allergies (verified) Hydrocodone, Linalool, Methylisothiazolinone, Propylene glycol, Aspirin, Eggs or egg-derived products, and Hydrocodone-guaifenesin   History: Past Medical History:  Diagnosis Date   Angio-edema    Anxiety    Arthritis    "lower back" (01/29/2017)   Asthma    Bone spur    spine   Chronic lower back pain    Cirrhosis of liver ([place]) 12/2018   Depression    Diabetes mellitus without complication (HCC)    Eczema    GERD (gastroesophageal reflux disease)    Hepatitis C    was treated for this   History of atrial fibrillation without current medication    cardioversion in 1990s   Hyperplastic polyp of intestine    Hypertension    Insomnia    Osteoporosis    Splenic artery aneurysm (HCC)    Urticaria    Past Surgical History:  Procedure Laterality Date   CARDIOVERSION  1990s   CHOLECYSTECTOMY N/A 01/22/2018   Procedure: LAPAROSCOPIC CHOLECYSTECTOMY ERAS PATHWAY;  Surgeon: Clovis Riley, MD;  Location: [facility];  Service: General;  Laterality: N/A;   CYSTOSCOPY W/ URETERAL STENT PLACEMENT Left 01/29/2017   Procedure: CYSTOSCOPY WITH RETROGRADE PYELOGRAM/URETERAL STENT PLACEMENT;  Surgeon: Ardis Hughs, MD;  Location: [facility];  Service: Urology;  Laterality: Left;   CYSTOSCOPY WITH RETROGRADE PYELOGRAM, URETEROSCOPY AND STENT PLACEMENT Left 01/29/2017   CYSTOSCOPY WITH RETROGRADE PYELOGRAM/URETERAL 44 PLACEMENT   Family History  Problem Relation Age of Onset   Asthma Mother    Diabetes Mother    Asthma Father     Diabetes Father    Cancer Father        prostate   Eczema Father    Hepatitis B Daughter    Breast cancer Neg Hx    Social History   Socioeconomic History   Marital status: Widowed    Spouse name: Not on file   Number of children: Not on file   Years of education: Not on file   Highest education level: Not on file  Occupational History   Not on file  Tobacco Use   Smoking status: Former    Packs/day: 0.25    Years: 37.00    Total pack years: 9.25    Types: Cigarettes    Quit date: 03/30/2008    Years since quitting: 13.3   Smokeless tobacco: Never  Vaping Use   Vaping Use: Never used  Substance and Sexual Activity  Alcohol use: No   Drug use: Never   Sexual activity: Not Currently    Birth control/protection: Post-menopausal  Other Topics Concern   Not on file  Social History Narrative   Not on file   Social Determinants of Health   Financial Resource Strain: Low Risk  (07/28/2021)   Overall Financial Resource Strain (CARDIA)    Difficulty of Paying Living Expenses: Not hard at all  Food Insecurity: No Food Insecurity (07/28/2021)   Hunger Vital Sign    Worried About Running Out of Food in the Last Year: Never true    Ran Out of Food in the Last Year: Never true  Transportation Needs: No Transportation Needs (07/28/2021)   PRAPARE - Hydrologist (Medical): No    Lack of Transportation (Non-Medical): No  Physical Activity: Insufficiently Active (07/28/2021)   Exercise Vital Sign    Days of Exercise per Week: 2 days    Minutes of Exercise per Session: 50 min  Stress: Stress Concern Present (07/28/2021)   [place]    Feeling of Stress : To some extent  Social Connections: Moderately Isolated (07/28/2021)   Social Connection and Isolation Panel [NHANES]    Frequency of Communication with Friends and Family: More than three times a week    Frequency of Social Gatherings  with Friends and Family: Twice a week    Attends Religious Services: More than 4 times per year    Active Member of [employer] or Organizations: No    Attends Archivist Meetings: Never    Marital Status: Widowed    Tobacco Counseling Counseling given: Not Answered   Clinical Intake:  Pre-visit preparation completed: Yes  Pain : No/denies pain     BMI - recorded: 38.62 Nutritional Status: BMI > 30  Obese Nutritional Risks: None Diabetes: Yes (120 per pt) CBG done?: No Did pt. bring in CBG monitor from home?: No  How often do you need to have someone help you when you read instructions, pamphlets, or other written materials from your doctor or pharmacy?: 1 - Never  Diabetic?Nutrition Risk Assessment:  Has the patient had any N/V/D within the last 2 months?  No  Does the patient have any non-healing wounds?  No  Has the patient had any unintentional weight loss or weight gain?  No   Diabetes:  Is the patient diabetic?  Yes  If diabetic, was a CBG obtained today?  Yes  Did the patient bring in their glucometer from home?  No  How often do you monitor your CBG's? daily.   Financial Strains and Diabetes Management:  Are you having any financial strains with the device, your supplies or your medication? No .  Does the patient want to be seen by Chronic Care Management for management of their diabetes?  No  Would the patient like to be referred to a Nutritionist or for Diabetic Management?  No   Diabetic Exams:  Diabetic Eye Exam: Completed 03/24/21 Diabetic Foot Exam: Completed 11/18/20   Interpreter Needed?: No  Information entered by :: Charlott Rakes, LPN   Activities of Daily Living    07/28/2021    2:23 PM  In your present state of health, do you have any difficulty performing the following activities:  Hearing? 0  Vision? 0  Difficulty concentrating or making decisions? 0  Walking or climbing stairs? 0  Dressing or bathing? 0  Doing errands,  shopping? 0  Preparing  Food and eating ? N  Using the Toilet? N  In the past six months, have you accidently leaked urine? N  Do you have problems with loss of bowel control? N  Managing your Medications? N  Managing your Finances? N  Housekeeping or managing your Housekeeping? N    Patient Care Team: [name], MD as PCP - General (Family Medicine) Arta Silence, MD as Consulting Physician (Gastroenterology) Linward Headland, NP as Nurse Practitioner (Psychology) Edythe Clarity, [facility] (Pharmacist)  Indicate any recent Medical Services you may have received from other than [facility] providers in the past year (date may be approximate).     Assessment:   This is a routine wellness examination for Jenaye.  Hearing/Vision screen Hearing Screening - Comments:: Pt denies any hearing issues  Vision Screening - Comments:: [facility] for [facility] exam   Dietary issues and exercise activities discussed: Current Exercise Habits: Structured exercise class, Type of exercise: Other - see comments (water aerobic), Time (Minutes): 45, Frequency (Times/Week): 2, Weekly Exercise (Minutes/Week): 90   Goals Addressed             This Visit's Progress    Patient Stated       Do better with exercise        Depression Screen    07/28/2021    2:20 PM 06/12/2021   11:04 AM 02/24/2021   10:27 AM 11/18/2020   11:29 AM 10/25/2020    9:12 AM 07/15/2020    2:36 PM 03/08/2020   10:03 AM  PHQ 2/9 Scores  PHQ - 2 Score '4 6 3 '$ 0 0 1 4  PHQ- 9 Score '10 13 7 '$ 0   8    Fall Risk    07/28/2021    2:23 PM 06/12/2021   11:03 AM 02/14/2021   11:20 AM 10/25/2020    9:11 AM 07/15/2020    2:39 PM  [place] in the past year? 0 0 0 0 0  Number falls in past yr: 0 0 0  0  Injury with Fall? 0 0 0  0  Risk for fall due to : Impaired vision No Fall Risks Impaired mobility;Impaired vision;Medication side effect  Impaired vision  Follow up Falls prevention discussed  Falls evaluation completed Falls prevention discussed;Education provided;Falls evaluation completed  Falls prevention discussed    FALL RISK PREVENTION PERTAINING TO THE HOME:  Any stairs in or around the home? Yes  If so, are there any without handrails? No  Home free of loose throw rugs in walkways, pet beds, electrical cords, etc? Yes  Adequate lighting in your home to reduce risk of falls? Yes   ASSISTIVE DEVICES UTILIZED TO PREVENT FALLS:  Life alert? No  Use of a cane, walker or w/c? No  Grab bars in the bathroom? No  Shower chair or bench in shower? No  Elevated toilet seat or a handicapped toilet? No   TIMED UP AND GO:  Was the test performed? No .   Cognitive Function:        07/28/2021    2:24 PM 07/15/2020    2:42 PM  6CIT Screen  What Year? 0 points 0 points  What month? 0 points 0 points  What time? 0 points 0 points  Count back from 20 0 points 0 points  Months in reverse 0 points 0 points  Repeat phrase 0 points 0 points  Total Score 0 points 0 points  Immunizations Immunization History  Administered Date(s) Administered   Fluad Quad(high Dose 65+) 11/18/2020   Hepatitis B, adult 10/12/2014, 11/09/2014, 04/11/2015   Influenza Split 11/14/2010, 10/09/2011   Influenza Whole 10/13/2007, 01/10/2009, 11/22/2009   Influenza,inj,Quad PF,6+ Mos 10/10/2012, 10/08/2013, 10/15/2014, 09/29/2015, 11/06/2016, 09/10/2017, 10/15/2018, 09/30/2019   PFIZER(Purple Top)SARS-COV-2 Vaccination 03/20/2019, 04/15/2019, 10/15/2019   PNEUMOCOCCAL CONJUGATE-20 11/18/2020   Pneumococcal Polysaccharide-23 01/10/2009   Td 01/09/1999, 01/10/2009    TDAP status: Due, Education has been provided regarding the importance of this vaccine. Advised may receive this vaccine at local pharmacy or Health Dept. Aware to provide a copy of the vaccination record if obtained from local pharmacy or Health Dept. Verbalized acceptance and understanding.  Flu Vaccine status: Up to  date  Pneumococcal vaccine status: Up to date  Covid-19 vaccine status: Completed vaccines  Qualifies for Shingles Vaccine? Yes   Zostavax completed No   Shingrix Completed?: No.    Education has been provided regarding the importance of this vaccine. Patient has been advised to call insurance company to determine out of pocket expense if they have not yet received this vaccine. Advised may also receive vaccine at local pharmacy or Health Dept. Verbalized acceptance and understanding.  Screening Tests Health Maintenance  Topic Date Due   TETANUS/TDAP  08/17/2021 (Originally 01/11/2019)   INFLUENZA VACCINE  08/08/2021   FOOT EXAM  11/18/2021   HEMOGLOBIN A1C  12/12/2021   OPHTHALMOLOGY EXAM  03/25/2022   MAMMOGRAM  05/06/2022   COLONOSCOPY (Pts 45-47yr Insurance coverage will need to be confirmed)  12/14/2029   Pneumonia Vaccine 66 Years old  Completed   DEXA SCAN  Completed   Hepatitis C Screening  Completed   HPV VACCINES  Aged Out   COVID-19 Vaccine  Discontinued   Zoster Vaccines- Shingrix  Discontinued    Health Maintenance  There are no preventive care reminders to display for this patient.  Colorectal cancer screening: Type of screening: Colonoscopy. Completed 12/15/19. Repeat every 10 years  Mammogram status: Completed 05/05/21. Repeat every year  Bone Density status: Completed 04/22/19. Results reflect: Bone density results: NORMAL. Repeat every 2 years.    Additional Screening:  Hepatitis C Screening:  Completed 01/04/20  Vision Screening: Recommended annual ophthalmology exams for early detection of glaucoma and other disorders of the eye. Is the patient up to date with their annual eye exam?  Yes  Who is the provider or what is the name of the office in which the patient attends annual eye exams? [facility] If pt is not established with a provider, would they like to be referred to a provider to establish care? No .   Dental Screening: Recommended annual  dental exams for proper oral hygiene  Community Resource Referral / Chronic Care Management: CRR required this visit?  No   CCM required this visit?  No      Plan:     I have personally reviewed and noted the following in the patient's chart:   Medical and social history Use of alcohol, tobacco or illicit drugs  Current medications and supplements including opioid prescriptions.  Functional ability and status Nutritional status Physical activity Advanced directives List of other physicians Hospitalizations, surgeries, and ER visits in previous 12 months Vitals Screenings to include cognitive, depression, and falls Referrals and appointments  In addition, I have reviewed and discussed with patient certain preventive protocols, quality metrics, and best practice recommendations. A written personalized care plan for preventive services as well as general preventive health recommendations were provided to  patient.     Willette Brace, LPN   [id number]   Nurse Notes: None

## 2021-08-14 ENCOUNTER — Other Ambulatory Visit: Payer: Self-pay

## 2021-08-22 ENCOUNTER — Telehealth: Payer: Self-pay | Admitting: Family Medicine

## 2021-08-22 NOTE — Telephone Encounter (Signed)
Val with [facility] requests Patient's most recent labs be sent to Fax# [phone]

## 2021-08-22 NOTE — Telephone Encounter (Signed)
Internal fax has been sent to [facility] with [facility]

## 2021-08-24 ENCOUNTER — Telehealth: Payer: Self-pay | Admitting: Pharmacist

## 2021-08-24 NOTE — Progress Notes (Signed)
Chronic Care Management Pharmacy Assistant   Name: Stacy Campbell  MRN: [id number] DOB: 03-Jan-1956   Reason for Encounter: Diabetes Adherence Call    Recent office visits:  06/12/2021 [facility] (PCP) [name], MD; no medication changes indicated.  Recent consult visits:  08/14/2021 [facility] (Derm) Merleen Nicely, MD; no medication changes indicated.  Hospital visits:  None in previous 6 months  Medications: Outpatient Encounter Medications as of 08/24/2021  Medication Sig   albuterol (VENTOLIN HFA) 108 (90 Base) MCG/ACT inhaler INHALE 2 PUFFS INTO THE LUNGS EVERY 6 HOURS AS NEEDED FOR WHEEZING OR SHORTNESS OF BREATH   amLODipine (NORVASC) 10 MG tablet TAKE 1 TABLET(10 MG) BY MOUTH DAILY   ARIPiprazole (ABILIFY) 5 MG tablet Take 1 tablet (5 mg total) by mouth daily.   ARNUITY ELLIPTA 100 MCG/ACT AEPB INHALE 1 PUFF INTO THE LUNGS DAILY   busPIRone (BUSPAR) 5 MG tablet Take 5 mg by mouth 2 (two) times daily.   carvedilol (COREG) 25 MG tablet TAKE 1 TABLET(25 MG) BY MOUTH TWICE DAILY WITH A MEAL   desvenlafaxine (PRISTIQ) 50 MG 24 hr tablet Take 50 mg by mouth daily.   doxepin (SINEQUAN) 25 MG capsule Take 25 mg by mouth 3 (three) times daily as needed.   EPINEPHrine 0.3 mg/0.3 mL IJ SOAJ injection Use as directed for severe allergic reaction   glucose blood test strip Test blood sugar daily and record on blood sugar log   JARDIANCE 10 MG TABS tablet TAKE 1 TABLET(10 MG) BY MOUTH DAILY   Lancets (ACCU-CHEK SOFT TOUCH) lancets Check blood sugar daily and record in log   lisinopril-hydrochlorothiazide (ZESTORETIC) 20-25 MG tablet TAKE 1 TABLET BY MOUTH DAILY   meclizine (ANTIVERT) 25 MG tablet Take 1 tablet (25 mg total) by mouth 3 (three) times daily as needed for dizziness.   metFORMIN (GLUCOPHAGE-XR) 500 MG 24 hr tablet TAKE 1 TABLET(500 MG) BY MOUTH DAILY WITH BREAKFAST   mycophenolate (CELLCEPT) 500 MG tablet Take 1 tablet by mouth 2 (two) times daily. Written by derm    nystatin cream (MYCOSTATIN) APPLY TOPICALLY TO THE AFFECTED AREA TWICE DAILY FOR 7 DAYS THEN AS NEEDED   olopatadine (PATANOL) 0.1 % ophthalmic solution Place 1 drop into both eyes 2 (two) times daily.   oxyCODONE-acetaminophen (PERCOCET) 10-325 MG tablet Take 1 tablet by mouth 5 (five) times daily as needed.   rosuvastatin (CRESTOR) 10 MG tablet TAKE 1 TABLET(10 MG) BY MOUTH DAILY   tiZANidine (ZANAFLEX) 4 MG tablet Take 4 mg by mouth 2 (two) times daily.   triamcinolone ointment (KENALOG) 0.1 % Apply 1 application topically 2 (two) times daily.   TRULICITY 5.80 DX/8.3JA SOPN ADMINISTER 0.75 MG UNDER THE SKIN 1 TIME A WEEK   zolpidem (AMBIEN) 10 MG tablet Take 10 mg by mouth at bedtime.   No facility-administered encounter medications on file as of 08/24/2021.   Recent Relevant Labs: Lab Results  Component Value Date/Time   HGBA1C 6.4 (A) 06/12/2021 11:16 AM   HGBA1C 6.6 (A) 02/24/2021 10:41 AM   HGBA1C 6.8 08/14/2019 08:38 AM   HGBA1C 9.8 (A) 11/24/2018 02:50 PM   MICROALBUR <0.7 02/24/2021 10:42 AM   MICROALBUR <0.7 01/04/2020 10:46 AM    Kidney Function Lab Results  Component Value Date/Time   CREATININE 1.29 (H) 05/06/2020 10:27 AM   CREATININE 1.53 (H) 02/15/2020 10:41 AM   CREATININE 0.91 02/08/2016 03:23 PM   CREATININE 0.71 12/06/2015 02:38 PM   GFR 43.77 (L) 05/06/2020 10:27 AM  GFRNONAA 59 (L) 11/24/2018 03:09 PM   GFRNONAA 69 02/08/2016 03:23 PM   GFRAA 68 11/24/2018 03:09 PM   GFRAA 79 02/08/2016 03:23 PM    Current antihyperglycemic regimen:  Jardiance 10 mg daily Metformin 500 mg daily  What recent interventions/DTPs have been made to improve glycemic control:  No recent interventions or DTPs.  Have there been any recent hospitalizations or ED visits since last visit with CPP? No  Patient denies hypoglycemic symptoms.  Patient denies hyperglycemic symptoms.  How often are you checking your blood sugar? once daily  What are your blood sugars ranging?   Fasting: 128  During the week, how often does your blood glucose drop below 70? Never  Are you checking your feet daily/regularly? Yes  Adherence Review: Is the patient currently on a STATIN medication? Yes Is the patient currently on ACE/ARB medication? Yes Does the patient have >5 day gap between last estimated fill dates? No  Patient c/o having a HA in the back of her head down to her shoulders. She states she has had this headache since Sunday 08/20/2021. She states she is currently taking pain medication for other reasons. This headache is not severe, she states the severity will change but does not fully go away. She plans to be seen for an evaluation if it doesn't go away or improve.  Care Gaps: Medicare Annual Wellness: Completed 07/2020 Ophthalmology Exam: Next due on 04/12/2021 Foot Exam: Next due on 11/18/2021 Hemoglobin A1C: 6.6% on 12/24/2021 Colonoscopy: Next fue on 12/14/2029 Dexa Scan: Completed Mammogram: Next due on 04/29/2021  Future Appointments  Date Time Provider [place]  12/12/2021 10:00 AM [name], MD [facility]  08/03/2022  2:00 PM [facility] HEALTH COACH [facility]   Star Rating Drugs: Trulicity 0.62 BJ/6.2 mL last filled 08/11/2021 28 DS Jardiance 10 mg last filled 08/10/2021 90 DS Metformin ER last filled 08/02/2021 90 DS Lisinopril-HCTZ 20/25 mg last filled 04/04/2021 90 DS Rosuvastatin 10 mg last filled 04/04/2021 90 DS -Patient states she is going to fill both Rosuvastatin and Lisinopril this week. She states she has not ran out of this medication.  April D Calhoun, [place] Pharmacist Assistant [phone]

## 2021-09-05 ENCOUNTER — Ambulatory Visit (INDEPENDENT_AMBULATORY_CARE_PROVIDER_SITE_OTHER): Payer: Medicare HMO | Admitting: Family Medicine

## 2021-09-05 ENCOUNTER — Ambulatory Visit (INDEPENDENT_AMBULATORY_CARE_PROVIDER_SITE_OTHER)
Admission: RE | Admit: 2021-09-05 | Discharge: 2021-09-05 | Disposition: A | Discharge status: Home / Self Care | Payer: Medicare HMO | Source: Ambulatory Visit | Attending: Family Medicine | Admitting: Family Medicine

## 2021-09-05 ENCOUNTER — Encounter: Payer: Self-pay | Admitting: Family Medicine

## 2021-09-05 VITALS — BP 110/70 | HR 68 | Temp 98.7°F | Ht 64.0 in | Wt 230.4 lb

## 2021-09-05 DIAGNOSIS — G8929 Other chronic pain: Secondary | ICD-10-CM

## 2021-09-05 DIAGNOSIS — F1120 Opioid dependence, uncomplicated: Secondary | ICD-10-CM

## 2021-09-05 DIAGNOSIS — M5442 Lumbago with sciatica, left side: Secondary | ICD-10-CM

## 2021-09-05 DIAGNOSIS — M5441 Lumbago with sciatica, right side: Secondary | ICD-10-CM

## 2021-09-05 DIAGNOSIS — M542 Cervicalgia: Secondary | ICD-10-CM

## 2021-09-05 DIAGNOSIS — M549 Dorsalgia, unspecified: Secondary | ICD-10-CM

## 2021-09-05 DIAGNOSIS — R519 Headache, unspecified: Secondary | ICD-10-CM | POA: Diagnosis not present

## 2021-09-05 MED ORDER — GABAPENTIN 300 MG PO CAPS: 300.0000 mg | ORAL_CAPSULE | Freq: Every day | ORAL | 1 refills | Status: DC

## 2021-09-05 NOTE — Progress Notes (Signed)
Subjective  CC:  Chief Complaint  Patient presents with   Headache    Pt stated that she has had an ongoing headache for the past week and it has not gotten any better.    HPI: Stacy Campbell is a 66 y.o. female who presents to the office today to address the problems listed above in the chief complaint. 65 year old female with multiple medical problems including chronic low back pain now on Percocet through [facility] pain clinic, type 2 diabetes, major depression on multiple mood medications, hyperlipidemia and asthma presents due to a almost 3-week headache.  She describes pain that starts in the back of her head and travels to the neck and upper back.  Pain is constant, throbbing interferes with sleep.  She denies injury, trauma, or prior back pain but she does have chronic DJD of the lumbar spine requiring chronic pain management now on narcotics.  She denies radicular symptoms or upper extremity weakness.  She denies photophobia, vision changes, nausea or vomiting.  She does not typically get headaches.  She has had no fevers or chills.  She has been using the oxycodone which does help the headache but it does return.  She is also on a muscle relaxer for her neck pain. Mood: Chronic depression now on multiple mood medications and she says her mood is stable.  She denies recent new stressors or mood problems. I reviewed recent CCM visit.  Assessment  1. Neck pain   2. Occipital headache   3. Upper back pain   4. Chronic bilateral low back pain with bilateral sciatica   5. Chronic narcotic dependence (HCC)      Plan  Neck pain and occipital headache: Suspect stemming from cervical spine.  Recommend x-rays of the neck, continue pain management and muscle relaxers and will add gabapentin at night.  Further work-up depending on x-ray findings.  Chiropractor may be of benefit.  She can also follow-up with chronic pain management if this persists.  She does not currently have red flag  symptoms of headaches, discussed emergent care if develops worst headache of her life, severe neck stiffness, associated fevers or chills. Chronic low back pain on chronic narcotics now.  Education given Mood: Reports good control on mood medications.  Follow up: As scheduled for diabetes follow-up 12/12/2021  Orders Placed This Encounter  Procedures   DG Cervical Spine Complete   Meds ordered this encounter  Medications   gabapentin (NEURONTIN) 300 MG capsule    Sig: Take 1 capsule (300 mg total) by mouth at bedtime.    Dispense:  90 capsule    Refill:  1      I reviewed the patients updated PMH, FH, and SocHx.    Patient Active Problem List   Diagnosis Date Noted   Cirrhosis of liver ([place]) - h/o hep C 01/04/2020    Priority: High   Monoclonal gammopathy of unknown significance (MGUS) 01/23/2019    Priority: High   Controlled type 2 diabetes mellitus without complication, without long-term current use of insulin ([place]) 12/12/2018    Priority: High   Hyperlipidemia associated with type 2 diabetes mellitus ([place]) 12/12/2018    Priority: High   Asthma with COPD ([place]) 05/25/2013    Priority: High   Class 2 severe obesity due to excess calories with serious comorbidity in adult, unspecified BMI ([place])     Priority: High   Major depression, recurrent, chronic ([place]) 03/07/2006    Priority: High   Essential hypertension 03/07/2006  Priority: High   Chronic low back pain 03/07/2006    Priority: High   Insomnia 03/07/2006    Priority: High   Chronic urticaria 06/12/2021    Priority: Medium    Degenerative joint disease (DJD) of lumbar spine 01/04/2020    Priority: Medium    History of hepatitis C 10/11/2017    Priority: Medium    History of atrial fibrillation 10/11/2017    Priority: Medium    Former smoker 10/11/2017    Priority: Medium    GERD (gastroesophageal reflux disease) 02/15/2017    Priority: Medium    Psoriasis 07/10/2015    Priority: Medium    Seasonal and  perennial allergic rhinoconjunctivitis 02/12/2018    Priority: Low   Dyshidrotic eczema 09/10/2017    Priority: Low   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    Priority: Low   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   ARNUITY ELLIPTA 100 MCG/ACT AEPB INHALE 1 PUFF INTO THE LUNGS DAILY   busPIRone (BUSPAR) 5 MG tablet Take 5 mg by mouth 2 (two) times daily.   carvedilol (COREG) 25 MG tablet TAKE 1 TABLET(25 MG) BY MOUTH TWICE DAILY WITH A MEAL   desvenlafaxine (PRISTIQ) 50 MG 24 hr tablet Take 50 mg by mouth daily.   doxepin (SINEQUAN) 25 MG capsule Take 25 mg by mouth 3 (three) times daily as needed.   EPINEPHrine 0.3 mg/0.3 mL IJ SOAJ injection Use as directed for severe allergic reaction   gabapentin (NEURONTIN) 300 MG capsule Take 1 capsule (300 mg total) by mouth at bedtime.   glucose blood test strip Test blood sugar daily and record on blood sugar log   JARDIANCE 10 MG TABS tablet TAKE 1 TABLET(10 MG) BY MOUTH DAILY   Lancets (ACCU-CHEK SOFT TOUCH) lancets Check blood sugar daily and record in log   lisinopril-hydrochlorothiazide (ZESTORETIC) 20-25 MG tablet TAKE 1 TABLET BY MOUTH DAILY   meclizine (ANTIVERT) 25 MG tablet Take 1 tablet (25 mg total) by mouth 3 (three) times daily as needed for dizziness.   metFORMIN (GLUCOPHAGE-XR) 500 MG 24 hr tablet TAKE 1 TABLET(500 MG) BY MOUTH DAILY WITH BREAKFAST   mycophenolate (CELLCEPT) 500 MG tablet Take 1 tablet by mouth 2 (two) times daily. Written by derm   nystatin cream (MYCOSTATIN) APPLY TOPICALLY TO THE AFFECTED AREA TWICE DAILY FOR 7 DAYS THEN AS NEEDED   olopatadine (PATANOL) 0.1 % ophthalmic solution Place 1 drop into both  eyes 2 (two) times daily.   oxyCODONE-acetaminophen (PERCOCET) 10-325 MG tablet Take 1 tablet by mouth 5 (five) times daily as needed.   rosuvastatin (CRESTOR) 10 MG tablet TAKE 1 TABLET(10 MG) BY MOUTH DAILY   tiZANidine (ZANAFLEX) 4 MG tablet Take 4 mg by mouth 2 (two) times daily.   triamcinolone ointment (KENALOG) 0.1 % Apply 1 application topically 2 (two) times daily.   TRULICITY 5.59 RC/1.6LA SOPN ADMINISTER 0.75 MG UNDER THE SKIN 1 TIME A WEEK   zolpidem (AMBIEN) 10 MG tablet Take 10 mg by mouth at bedtime.    Allergies: Patient is allergic to hydrocodone, linalool, methylisothiazolinone, propylene glycol, aspirin, eggs or egg-derived products, and hydrocodone-guaifenesin. Family History: Patient  family history includes Asthma in her father and mother; Cancer in her father; Diabetes in her father and mother; Eczema in her father; Hepatitis B in her daughter. Social History:  Patient  reports that she quit smoking about 13 years ago. Her smoking use included cigarettes. She has a 9.25 pack-year smoking history. She has never used smokeless tobacco. She reports that she does not drink alcohol and does not use drugs.  Review of Systems: Constitutional: Negative for fever malaise or anorexia Cardiovascular: negative for chest pain Respiratory: negative for SOB or persistent cough Gastrointestinal: negative for abdominal pain  Objective  Vitals: BP 110/70   Pulse 68   Temp 98.7 F (37.1 C)   Ht '5\' 4"'$  (1.626 m)   Wt 230 lb 6.4 oz (104.5 kg)   SpO2 98%   BMI 39.55 kg/m  General: Appears mildly uncomfortable but nontoxic, A&Ox3 HEENT: PEERL, conjunctiva normal, decreased range of motion of neck: Pain with flexion and extension and rotation.  Cervical spine tenderness with no step-off, bilateral traps are tender and tight   Commons side effects, risks, benefits, and alternatives for medications and treatment plan prescribed today were discussed, and the patient expressed  understanding of the given instructions. Patient is instructed to call or message via MyChart if he/she has any questions or concerns regarding our treatment plan. No barriers to understanding were identified. We discussed Red Flag symptoms and signs in detail. Patient expressed understanding regarding what to do in case of urgent or emergency type symptoms.  Medication list was reconciled, printed and provided to the patient in AVS. Patient instructions and summary information was reviewed with the patient as documented in the AVS. This note was prepared with assistance of Dragon voice recognition software. Occasional wrong-word or sound-a-like substitutions may have occurred due to the inherent limitations of voice recognition software  This visit occurred during the SARS-CoV-2 public health emergency.  Safety protocols were in place, including screening questions prior to the visit, additional usage of staff PPE, and extensive cleaning of exam room while observing appropriate contact time as indicated for disinfecting solutions.

## 2021-09-05 NOTE — Patient Instructions (Signed)
Please follow up as scheduled for your next visit with me: 12/12/2021   If you have any questions or concerns, please don't hesitate to send me a message via MyChart or call the office at [phone]. Thank you for visiting with [place] today! It's our pleasure caring for you.   Please go to our [facility] office to get your xrays done. You can walk in M-F between 8:30am- noon or 1pm - 5pm. Tell them you are there for xrays ordered by me. They will send me the results, then I will let you know the results with instructions.   Address: [address].  The Xray department is located in the basement.

## 2021-09-06 ENCOUNTER — Encounter: Payer: Self-pay | Admitting: Family Medicine

## 2021-09-06 DIAGNOSIS — M47812 Spondylosis without myelopathy or radiculopathy, cervical region: Secondary | ICD-10-CM | POA: Insufficient documentation

## 2021-09-27 ENCOUNTER — Other Ambulatory Visit: Payer: Self-pay

## 2021-09-27 ENCOUNTER — Ambulatory Visit (INDEPENDENT_AMBULATORY_CARE_PROVIDER_SITE_OTHER): Payer: Medicare HMO | Admitting: Neurology

## 2021-09-27 ENCOUNTER — Ambulatory Visit (INDEPENDENT_AMBULATORY_CARE_PROVIDER_SITE_OTHER): Payer: Medicare HMO | Admitting: Physician Assistant

## 2021-09-27 ENCOUNTER — Encounter: Payer: Self-pay | Admitting: Physician Assistant

## 2021-09-27 ENCOUNTER — Emergency Department (HOSPITAL_BASED_OUTPATIENT_CLINIC_OR_DEPARTMENT_OTHER)
Admission: EM | Admit: 2021-09-27 | Discharge: 2021-09-27 | Disposition: A | Discharge status: Home / Self Care | Payer: Medicare HMO | Attending: Emergency Medicine | Admitting: Emergency Medicine

## 2021-09-27 ENCOUNTER — Emergency Department (HOSPITAL_BASED_OUTPATIENT_CLINIC_OR_DEPARTMENT_OTHER): Payer: Medicare HMO

## 2021-09-27 ENCOUNTER — Encounter (HOSPITAL_BASED_OUTPATIENT_CLINIC_OR_DEPARTMENT_OTHER): Payer: Self-pay | Admitting: Emergency Medicine

## 2021-09-27 ENCOUNTER — Encounter (HOSPITAL_COMMUNITY): Payer: Self-pay

## 2021-09-27 ENCOUNTER — Telehealth: Payer: Self-pay | Admitting: *Deleted

## 2021-09-27 ENCOUNTER — Encounter: Payer: Self-pay | Admitting: Neurology

## 2021-09-27 VITALS — BP 132/84 | HR 75 | Temp 98.6°F | Resp 16 | Ht 64.0 in | Wt 231.2 lb

## 2021-09-27 VITALS — BP 135/89 | HR 104 | Ht 64.0 in | Wt 232.4 lb

## 2021-09-27 DIAGNOSIS — I4891 Unspecified atrial fibrillation: Secondary | ICD-10-CM | POA: Insufficient documentation

## 2021-09-27 DIAGNOSIS — R519 Headache, unspecified: Secondary | ICD-10-CM

## 2021-09-27 DIAGNOSIS — Z8679 Personal history of other diseases of the circulatory system: Secondary | ICD-10-CM

## 2021-09-27 DIAGNOSIS — I10 Essential (primary) hypertension: Secondary | ICD-10-CM | POA: Diagnosis not present

## 2021-09-27 DIAGNOSIS — F439 Reaction to severe stress, unspecified: Secondary | ICD-10-CM | POA: Diagnosis not present

## 2021-09-27 DIAGNOSIS — Z9189 Other specified personal risk factors, not elsewhere classified: Secondary | ICD-10-CM

## 2021-09-27 DIAGNOSIS — R351 Nocturia: Secondary | ICD-10-CM

## 2021-09-27 DIAGNOSIS — F1190 Opioid use, unspecified, uncomplicated: Secondary | ICD-10-CM

## 2021-09-27 DIAGNOSIS — Z7984 Long term (current) use of oral hypoglycemic drugs: Secondary | ICD-10-CM | POA: Insufficient documentation

## 2021-09-27 DIAGNOSIS — Z7901 Long term (current) use of anticoagulants: Secondary | ICD-10-CM | POA: Diagnosis not present

## 2021-09-27 DIAGNOSIS — Z7982 Long term (current) use of aspirin: Secondary | ICD-10-CM | POA: Insufficient documentation

## 2021-09-27 DIAGNOSIS — G4700 Insomnia, unspecified: Secondary | ICD-10-CM | POA: Diagnosis not present

## 2021-09-27 DIAGNOSIS — E119 Type 2 diabetes mellitus without complications: Secondary | ICD-10-CM | POA: Insufficient documentation

## 2021-09-27 DIAGNOSIS — Z79899 Other long term (current) drug therapy: Secondary | ICD-10-CM | POA: Diagnosis not present

## 2021-09-27 DIAGNOSIS — E669 Obesity, unspecified: Secondary | ICD-10-CM

## 2021-09-27 DIAGNOSIS — I480 Paroxysmal atrial fibrillation: Secondary | ICD-10-CM

## 2021-09-27 DIAGNOSIS — I499 Cardiac arrhythmia, unspecified: Secondary | ICD-10-CM

## 2021-09-27 HISTORY — DX: Unspecified osteoarthritis, unspecified site: M19.90

## 2021-09-27 HISTORY — DX: Paroxysmal atrial fibrillation: I48.0

## 2021-09-27 LAB — COMPREHENSIVE METABOLIC PANEL
ALT: 11 U/L (ref 0–44)
AST: 18 U/L (ref 15–41)
Albumin: 4.3 g/dL (ref 3.5–5.0)
Alkaline Phosphatase: 73 U/L (ref 38–126)
Anion gap: 12 (ref 5–15)
BUN: 11 mg/dL (ref 8–23)
CO2: 24 mmol/L (ref 22–32)
Calcium: 9.3 mg/dL (ref 8.9–10.3)
Chloride: 105 mmol/L (ref 98–111)
Creatinine, Ser: 0.77 mg/dL (ref 0.44–1.00)
GFR, Estimated: >60 mL/min (ref >60)
Glucose, Bld: 114 mg/dL — ABNORMAL HIGH (ref 70–99)
Potassium: 4.3 mmol/L (ref 3.5–5.1)
Sodium: 141 mmol/L (ref 135–145)
Total Bilirubin: 0.4 mg/dL (ref 0.3–1.2)
Total Protein: 8.1 g/dL (ref 6.5–8.1)

## 2021-09-27 LAB — TROPONIN I (HIGH SENSITIVITY)
Troponin I (High Sensitivity): 4 ng/L (ref <18)
Troponin I (High Sensitivity): 4 ng/L (ref <18)

## 2021-09-27 LAB — CBC WITH DIFFERENTIAL/PLATELET
Abs Immature Granulocytes: 0.01 10*3/uL (ref 0.00–0.07)
Basophils Absolute: 0.1 10*3/uL (ref 0.0–0.1)
Basophils Relative: 1 %
Eosinophils Absolute: 0.5 10*3/uL (ref 0.0–0.5)
Eosinophils Relative: 6 %
HCT: 50.1 % — ABNORMAL HIGH (ref 36.0–46.0)
Hemoglobin: 15.7 g/dL — ABNORMAL HIGH (ref 12.0–15.0)
Immature Granulocytes: 0 %
Lymphocytes Relative: 22 %
Lymphs Abs: 1.8 10*3/uL (ref 0.7–4.0)
MCH: 24.1 pg — ABNORMAL LOW (ref 26.0–34.0)
MCHC: 31.3 g/dL (ref 30.0–36.0)
MCV: 76.8 fL — ABNORMAL LOW (ref 80.0–100.0)
Monocytes Absolute: 0.6 10*3/uL (ref 0.1–1.0)
Monocytes Relative: 7 %
Neutro Abs: 5.2 10*3/uL (ref 1.7–7.7)
Neutrophils Relative %: 64 %
Platelets: 219 10*3/uL (ref 150–400)
RBC: 6.52 MIL/uL — ABNORMAL HIGH (ref 3.87–5.11)
RDW: 14.8 % (ref 11.5–15.5)
WBC: 8 10*3/uL (ref 4.0–10.5)
nRBC: 0 % (ref 0.0–0.2)

## 2021-09-27 MED ORDER — DILTIAZEM HCL 25 MG/5ML IV SOLN
10.0000 mg | Freq: Once | INTRAVENOUS | Status: AC
  Administered 2021-09-27: 10 mg via INTRAVENOUS

## 2021-09-27 MED ORDER — APIXABAN 5 MG PO TABS: 5.0000 mg | ORAL_TABLET | Freq: Two times a day (BID) | ORAL | 0 refills | Status: DC

## 2021-09-27 MED ORDER — SODIUM CHLORIDE 0.9 % IV SOLN: INTRAVENOUS | Status: DC | PRN

## 2021-09-27 MED ORDER — DILTIAZEM HCL ER COATED BEADS 360 MG PO CP24: 360.0000 mg | ORAL_CAPSULE | Freq: Every day | ORAL | 0 refills | Status: DC

## 2021-09-27 MED ORDER — DILTIAZEM LOAD VIA INFUSION
20.0000 mg | Freq: Once | INTRAVENOUS | Status: AC
  Administered 2021-09-27: 20 mg via INTRAVENOUS
  Filled 2021-09-27: qty 20

## 2021-09-27 MED ORDER — DILTIAZEM HCL-DEXTROSE 125-5 MG/125ML-% IV SOLN (PREMIX)
5.0000 mg/h | INTRAVENOUS | Status: DC
  Administered 2021-09-27: 5 mg/h via INTRAVENOUS
  Filled 2021-09-27 (×2): qty 125

## 2021-09-27 MED ORDER — APIXABAN 2.5 MG PO TABS
5.0000 mg | ORAL_TABLET | Freq: Two times a day (BID) | ORAL | Status: DC
  Administered 2021-09-27 (×2): 5 mg via ORAL
  Filled 2021-09-27 (×2): qty 2

## 2021-09-27 MED ORDER — DILTIAZEM HCL ER COATED BEADS 120 MG PO CP24
360.0000 mg | ORAL_CAPSULE | Freq: Once | ORAL | Status: AC
  Administered 2021-09-27: 360 mg via ORAL
  Filled 2021-09-27: qty 3

## 2021-09-27 NOTE — Patient Instructions (Addendum)
Thank you for choosing [facility] for your sleep related care! It was nice to meet you today!   Here is what we discussed today:   Your heart rhythm is irregular, you may be in A fib. I would like for you to see you Primary care today if possible to get a formal EKG done. You may benefit from seeing a cardiologist and may need to be on a blood thinner.    Based on your symptoms and your exam I believe you are at risk for obstructive sleep apnea (aka OSA). We should proceed with a sleep study to determine whether you do or do not have OSA and how severe it is. Even, if you have mild OSA, I may want you to consider treatment with CPAP, as treatment of even borderline or mild sleep apnea can result and improvement of symptoms such as sleep disruption, daytime sleepiness, nighttime bathroom breaks, restless leg symptoms, improvement of headache syndromes, even improved mood disorder.   As explained, an attended sleep study (meaning you get to stay overnight in the sleep lab), lets [place] monitor sleep-related behaviors such as sleep talking and leg movements in sleep, in addition to monitoring for sleep apnea.  A home sleep test is a screening tool for sleep apnea diagnosis only, but unfortunately, does not help with any other sleep-related diagnoses.  Please remember, the long-term risks and ramifications of untreated moderate to severe obstructive sleep apnea may include (but are not limited to): increased risk for cardiovascular disease, including congestive heart failure, stroke, difficult to control hypertension, treatment resistant obesity, arrhythmias, especially irregular heartbeat commonly known as A. Fib. (atrial fibrillation); even type 2 diabetes has been linked to untreated OSA.   Other correlations that untreated obstructive sleep apnea include macular edema which is swelling of the retina in the eyes, droopy eyelid syndrome, and elevated hemoglobin and hematocrit levels (often  referred to as polycythemia).  Sleep apnea can cause disruption of sleep and sleep deprivation in most cases, which, in turn, can cause recurrent headaches, problems with memory, mood, concentration, focus, and vigilance. Most people with untreated sleep apnea report excessive daytime sleepiness, which can affect their ability to drive. Please do not drive or use heavy equipment or machinery, if you feel sleepy! Patients with sleep apnea can also develop difficulty initiating and maintaining sleep (aka insomnia).   Having sleep apnea may increase your risk for other sleep disorders, including involuntary behaviors sleep such as sleep terrors, sleep talking, sleepwalking.    Having sleep apnea can also increase your risk for restless leg syndrome and leg movements at night.   Please note that untreated obstructive sleep apnea may carry additional perioperative morbidity. Patients with significant obstructive sleep apnea (typically, in the moderate to severe degree) should receive, if possible, perioperative PAP (positive airway pressure) therapy and the surgeons and particularly the anesthesiologists should be informed of the diagnosis and the severity of the sleep disordered breathing.   We will call you or email you through [place] with regards to your test results and plan a follow-up in sleep clinic accordingly. Most likely, you will hear from one of our nurses.   Our sleep lab administrative assistant will call you to schedule your sleep study and give you further instructions, regarding the check in process for the sleep study, arrival time, what to bring, when you can expect to leave after the study, etc., and to answer any other logistical questions you may have. If you don't hear back from  her by about 2 weeks from now, please feel free to call her direct line at [phone] or you can call our general clinic number, or email [place] through My Chart.

## 2021-09-27 NOTE — ED Notes (Signed)
Discharge instructions, follow up care, and prescriptions reviewed and explained, pt verbalized understanding. Teaching on Eliquis with info packet provided. Pt had no further questions on d/c. Pt caox4 and ambulatory on d/c.

## 2021-09-27 NOTE — ED Provider Notes (Signed)
[facility] EMERGENCY DEPT Provider Note   CSN: [id number] Arrival date & time: 09/27/21  1211     History  Chief Complaint  Patient presents with   Atrial Fibrillation    Stacy Campbell is a 66 y.o. female.  Patient is a 67 year old female presenting from sleep clinic after she was found to be in A-fib.  Patient admits to remote history of atrial fibrillation back in the 1990s in which she was treated with an ablation.  Patient is not on any antiarrhythmics, rate control, or blood thinners.  States she has not had a repeat episode since the 1990s.  Denies any fevers, chills, coughing.  Denies any shortness of breath, chest pain, lower extremity swelling, or new symptoms.  The history is provided by the patient. No language interpreter was used.  Atrial Fibrillation Pertinent negatives include no chest pain, no abdominal pain and no shortness of breath.       Home Medications Prior to Admission medications   Medication Sig Start Date End Date Taking? Authorizing Provider  albuterol (VENTOLIN HFA) 108 (90 Base) MCG/ACT inhaler INHALE 2 PUFFS INTO THE LUNGS EVERY 6 HOURS AS NEEDED FOR WHEEZING OR SHORTNESS OF BREATH 03/29/21  Yes [name], MD  ARIPiprazole (ABILIFY) 5 MG tablet Take 1 tablet (5 mg total) by mouth daily. 12/10/18  Yes Sherene Sires, DO  Aspirin-Salicylamide-Caffeine (BC HEADACHE PO) Take by mouth.   Yes [provider]  busPIRone (BUSPAR) 5 MG tablet Take 5 mg by mouth 2 (two) times daily.   Yes [provider]  carvedilol (COREG) 25 MG tablet TAKE 1 TABLET(25 MG) BY MOUTH TWICE DAILY WITH A MEAL 02/06/21  Yes [name], MD  desvenlafaxine (PRISTIQ) 50 MG 24 hr tablet Take 50 mg by mouth daily.   Yes [provider]  doxepin (SINEQUAN) 25 MG capsule Take 25 mg by mouth 3 (three) times daily as needed.   Yes [provider]  gabapentin (NEURONTIN) 300 MG capsule Take 1 capsule (300 mg total) by mouth at  bedtime. 09/05/21  Yes [name], MD  JARDIANCE 10 MG TABS tablet TAKE 1 TABLET(10 MG) BY MOUTH DAILY 02/06/21  Yes [name], MD  lisinopril-hydrochlorothiazide (ZESTORETIC) 20-25 MG tablet TAKE 1 TABLET BY MOUTH DAILY 01/06/21  Yes [name], MD  metFORMIN (GLUCOPHAGE-XR) 500 MG 24 hr tablet TAKE 1 TABLET(500 MG) BY MOUTH DAILY WITH BREAKFAST 11/28/20  Yes [name], MD  mycophenolate (CELLCEPT) 500 MG tablet Take 1 tablet by mouth 2 (two) times daily. Written by derm   Yes [provider]  oxyCODONE-acetaminophen (PERCOCET) 10-325 MG tablet Take 1 tablet by mouth 5 (five) times daily as needed. 07/04/21  Yes [provider]  rosuvastatin (CRESTOR) 10 MG tablet TAKE 1 TABLET(10 MG) BY MOUTH DAILY 01/06/21  Yes [name], MD  tiZANidine (ZANAFLEX) 4 MG tablet Take 4 mg by mouth 2 (two) times daily.   Yes [provider]  zolpidem (AMBIEN) 10 MG tablet Take 10 mg by mouth at bedtime.   Yes [provider]  ARNUITY ELLIPTA 100 MCG/ACT AEPB INHALE 1 PUFF INTO THE LUNGS DAILY Patient not taking: Reported on 09/27/2021 10/24/20   [name], MD  EPINEPHrine 0.3 mg/0.3 mL IJ SOAJ injection Use as directed for severe allergic reaction 02/12/18   Garnet Sierras, DO  glucose blood test strip Test blood sugar daily and record on blood sugar log 12/16/18   Sherene Sires, DO  Lancets (ACCU-CHEK SOFT TOUCH) lancets Check blood sugar daily and record in log 12/16/18   Sherene Sires, DO  meclizine (ANTIVERT) 25 MG tablet Take 1 tablet (25 mg total) by mouth 3 (three) times daily as needed for dizziness. 08/17/20   [name], MD  nystatin cream (MYCOSTATIN) APPLY TOPICALLY TO THE AFFECTED AREA TWICE DAILY FOR 7 DAYS THEN AS NEEDED 09/26/20   [name], MD  olopatadine (PATANOL) 0.1 % ophthalmic solution Place 1 drop into both eyes 2 (two) times daily. 11/18/20   [name], MD  triamcinolone ointment (KENALOG) 0.1 % Apply 1 application  topically 2 (two) times daily.    [provider]  TRULICITY 8.11 BJ/4.7WG SOPN ADMINISTER 0.75 MG UNDER THE SKIN 1 TIME A WEEK 07/10/21   [name], MD      Allergies    Hydrocodone, Linalool, Methylisothiazolinone, Propylene glycol, Aspirin, Eggs or egg-derived products, and Hydrocodone-guaifenesin    Review of Systems   Review of Systems  Constitutional:  Negative for chills and fever.  HENT:  Negative for ear pain and sore throat.   Eyes:  Negative for pain and visual disturbance.  Respiratory:  Negative for cough and shortness of breath.   Cardiovascular:  Negative for chest pain and palpitations.  Gastrointestinal:  Negative for abdominal pain and vomiting.  Genitourinary:  Negative for dysuria and hematuria.  Musculoskeletal:  Negative for arthralgias and back pain.  Skin:  Negative for color change and rash.  Neurological:  Negative for seizures and syncope.  All other systems reviewed and are negative.   Physical Exam Updated Vital Signs BP (!) 153/89   Pulse 86   Temp 98.8 F (37.1 C) (Temporal)   Resp 14   SpO2 98%  Physical Exam Vitals and nursing note reviewed.  Constitutional:      General: She is not in acute distress.    Appearance: She is well-developed.  HENT:     Head: Normocephalic and atraumatic.  Eyes:     Conjunctiva/sclera: Conjunctivae normal.  Cardiovascular:     Rate and Rhythm: Tachycardia present. Rhythm irregular.     Heart sounds: No murmur heard. Pulmonary:     Effort: Pulmonary effort is normal. No respiratory distress.     Breath sounds: Normal breath sounds.  Abdominal:     Palpations: Abdomen is soft.     Tenderness: There is no abdominal tenderness.  Musculoskeletal:        General: No swelling.     Cervical back: Neck supple.  Skin:    General: Skin is warm and dry.     Capillary Refill: Capillary refill takes less than 2 seconds.  Neurological:     Mental Status: She is alert.  Psychiatric:        Mood and  Affect: Mood normal.     ED Results / Procedures / Treatments   Labs (all labs ordered are listed, but only abnormal results are displayed) Labs Reviewed  CBC WITH DIFFERENTIAL/PLATELET - Abnormal; Notable for the following components:      Result Value   RBC 6.52 (*)    Hemoglobin 15.7 (*)    HCT 50.1 (*)    MCV 76.8 (*)    MCH 24.1 (*)    All other components within normal limits  COMPREHENSIVE METABOLIC PANEL - Abnormal; Notable for the following components:   Glucose, Bld 114 (*)    All other components within normal limits  TROPONIN I (HIGH SENSITIVITY)  TROPONIN I (HIGH  SENSITIVITY)    EKG EKG Interpretation  Date/Time:  Wednesday September 27 2021 12:21:44 EDT Ventricular Rate:  137 PR Interval:    QRS Duration: 64 QT Interval:  266 QTC Calculation: 401 R Axis:   60 Text Interpretation: Atrial fibrillation with rapid ventricular response Low voltage QRS Septal infarct (cited on or before 27-Sep-2021) Abnormal ECG When compared with ECG of 20-Jan-2009 16:33, Atrial fibrillation has replaced Sinus rhythm Vent. rate has increased BY  73 BPM Questionable change in initial forces of Anteroseptal leads ST no longer elevated in Lateral leads Nonspecific T wave abnormality now evident in Lateral leads Confirmed by Campbell Stall ([id number]) on 06/09/930 3:58:01 PM  Radiology DG Chest Portable 1 View  Result Date: 09/27/2021 CLINICAL DATA:  Atrial fibrillation EXAM: PORTABLE CHEST 1 VIEW COMPARISON:  03/18/2008 FINDINGS: Transverse diameter of heart is increased. There are no signs of pulmonary edema. There is blunting of left lateral CP angle. There is no focal pulmonary consolidation. IMPRESSION: Cardiomegaly.  Small left pleural effusion. Electronically Signed   By: Elmer Picker M.D.   On: 09/27/2021 13:38    Procedures .Critical Care  Performed by: Lianne Cure, DO Authorized by: Lianne Cure, DO   Critical care provider statement:    Critical care time (minutes):   30   Critical care was necessary to treat or prevent imminent or life-threatening deterioration of the following conditions: Atrial fibrillation, requiring rate control with bolus and drip occasions.   Critical care was time spent personally by me on the following activities:  Development of treatment plan with patient or surrogate, discussions with consultants, evaluation of patient's response to treatment, examination of patient, ordering and review of laboratory studies, ordering and review of radiographic studies, ordering and performing treatments and interventions, pulse oximetry, re-evaluation of patient's condition and review of old charts   Care discussed with comment:  Cardiology     Medications Ordered in ED Medications  apixaban (ELIQUIS) tablet 5 mg (5 mg Oral Given 09/27/21 1503)  diltiazem (CARDIZEM) 1 mg/mL load via infusion 20 mg (20 mg Intravenous Bolus from Bag 09/27/21 1500)    And  diltiazem (CARDIZEM) 125 mg in dextrose 5% 125 mL (1 mg/mL) infusion (7.5 mg/hr Intravenous Rate/Dose Change 09/27/21 1538)  0.9 %  sodium chloride infusion (0 mLs Intravenous Stopped 09/27/21 1455)    ED Course/ Medical Decision Making/ A&P                           Medical Decision Making Amount and/or Complexity of Data Reviewed Labs: ordered. Radiology: ordered.  Risk Prescription drug management.   32:2 PM 66 year old female presenting from sleep clinic after she was found to be in A-fib.  Patient is alert and oriented x3, no acute distress, afebrile, stable vital signs.  Physical exam demonstrates no abnormal lung sounds.  No new heart murmurs.  No lower extremity swelling.  EKG demonstrates atrial fibrillation with a rate of 132 bpm.  No ST segment elevation or depression.  No interval changes.  Troponin within normal limits.  Stable electrolytes.  Chest x-ray demonstrates small left-sided pleural effusion only.  Patient is not anticoagulated at this time.  Does not know when  symptoms began.  Does not have care established with a cardiologist.  CHADSVASC2 score: 3 for HTN, DM, and Age  I spoke with on-call cardiologist who recommends taking patient off of home Norvasc and switching it to Cardizem.  Patient Cardizem drip and bolus given  in emergency department.  If rate control is achieved patient is safe to return home follow-up in the A-fib clinic.  Ambulatory referral ordered..  If rate control is not achieved patient is recommended for admission.  Signed out to oncoming provider Dr.Rancour while awaiting Cardizem drip and bolus.         Final Clinical Impression(s) / ED Diagnoses Final diagnoses:  Atrial fibrillation, unspecified type [facility])    Rx / DC Orders ED Discharge Orders          Ordered    Amb Referral to AFIB Clinic        09/27/21 [place], [place] P, DO 22/57/50 1559

## 2021-09-27 NOTE — Progress Notes (Signed)
Stacy Campbell is a 65 y.o. female here for a follow up of a pre-existing problem.  History of Present Illness:   Chief Complaint  Patient presents with   Atrial Fibrillation    Seen at Neuro for sleep study assessment and A. Fib heard during visit, advised to see PCP for EKG     HPI  Atrial fibrillation Patient went to neurology today for sleep study assessment. Was told that it sounded like she had atrial fibrillation and was told to follow-up with [place].  Patient reports that she had an ablation in the 1990's for a fib and has had no issues since. Does not see cardiology. She is currently taking coreg 25 mg BID and endorses compliance with this.  Currently denies: chest pain, SOB, lightheadedness, dizziness  She is a current smoker, has DM and obesity. She states that she had a URI last week but otherwise has been in good health.  Past Medical History:  Diagnosis Date   Angio-edema    Anxiety    Arthritis    "lower back" (01/29/2017)   Asthma    Bone spur    spine   Chronic lower back pain    Cirrhosis of liver ([place]) 12/2018   Depression    Diabetes mellitus without complication (HCC)    Eczema    GERD (gastroesophageal reflux disease)    Hepatitis C    was treated for this   History of atrial fibrillation without current medication    cardioversion in 1990s   Hyperplastic polyp of intestine    Hypertension    Insomnia    Osteoporosis    Splenic artery aneurysm (HCC)    Urticaria      Social History   Tobacco Use   Smoking status: Former    Packs/day: 0.25    Years: 37.00    Total pack years: 9.25    Types: Cigarettes    Quit date: 03/30/2008    Years since quitting: 13.5   Smokeless tobacco: Never  Vaping Use   Vaping Use: Never used  Substance Use Topics   Alcohol use: No   Drug use: Never    Past Surgical History:  Procedure Laterality Date   CARDIOVERSION  1990s   CHOLECYSTECTOMY N/A 01/22/2018   Procedure: LAPAROSCOPIC CHOLECYSTECTOMY  ERAS PATHWAY;  Surgeon: Clovis Riley, MD;  Location: [facility];  Service: General;  Laterality: N/A;   CYSTOSCOPY W/ URETERAL STENT PLACEMENT Left 01/29/2017   Procedure: CYSTOSCOPY WITH RETROGRADE PYELOGRAM/URETERAL STENT PLACEMENT;  Surgeon: Ardis Hughs, MD;  Location: [place];  Service: Urology;  Laterality: Left;   CYSTOSCOPY WITH RETROGRADE PYELOGRAM, URETEROSCOPY AND STENT PLACEMENT Left 01/29/2017   CYSTOSCOPY WITH RETROGRADE PYELOGRAM/URETERAL STENT PLACEMENT    Family History  Problem Relation Age of Onset   Asthma Mother    Diabetes Mother    Asthma Father    Diabetes Father    Cancer Father        prostate   Eczema Father    Alcoholism Brother    Asthma Brother    Diabetes Brother    Asthma Brother    Diabetes Brother    Asthma Brother    Diabetes Brother    Asthma Brother    Diabetes Brother    Hepatitis B Daughter    Breast cancer Neg Hx     Allergies  Allergen Reactions   Hydrocodone Itching   Linalool    Methylisothiazolinone Other (See Comments)    Other reaction(s): Other (See  Comments) Positive patch test Positive patch test    Propylene Glycol    Aspirin Itching and Other (See Comments)    Lower dose doesn't make her itch (she takes it every day)   Eggs Or Egg-Derived Products Nausea Only and Other (See Comments)    No reaction if in another food   Hydrocodone-Guaifenesin Rash    Current Medications:   Current Outpatient Medications:    albuterol (VENTOLIN HFA) 108 (90 Base) MCG/ACT inhaler, INHALE 2 PUFFS INTO THE LUNGS EVERY 6 HOURS AS NEEDED FOR WHEEZING OR SHORTNESS OF BREATH, Disp: 54 g, Rfl: 1   amLODipine (NORVASC) 10 MG tablet, TAKE 1 TABLET(10 MG) BY MOUTH DAILY, Disp: 90 tablet, Rfl: 3   ARIPiprazole (ABILIFY) 5 MG tablet, Take 1 tablet (5 mg total) by mouth daily., Disp: 90 tablet, Rfl: 0   ARNUITY ELLIPTA 100 MCG/ACT AEPB, INHALE 1 PUFF INTO THE LUNGS DAILY, Disp: 30 each, Rfl: 2   busPIRone (BUSPAR) 5 MG tablet, Take 5 mg by  mouth 2 (two) times daily., Disp: , Rfl:    carvedilol (COREG) 25 MG tablet, TAKE 1 TABLET(25 MG) BY MOUTH TWICE DAILY WITH A MEAL, Disp: 180 tablet, Rfl: 3   desvenlafaxine (PRISTIQ) 50 MG 24 hr tablet, Take 50 mg by mouth daily., Disp: , Rfl:    doxepin (SINEQUAN) 25 MG capsule, Take 25 mg by mouth 3 (three) times daily as needed., Disp: , Rfl:    EPINEPHrine 0.3 mg/0.3 mL IJ SOAJ injection, Use as directed for severe allergic reaction, Disp: 2 Device, Rfl: 2   gabapentin (NEURONTIN) 300 MG capsule, Take 1 capsule (300 mg total) by mouth at bedtime., Disp: 90 capsule, Rfl: 1   glucose blood test strip, Test blood sugar daily and record on blood sugar log, Disp: 100 each, Rfl: 12   JARDIANCE 10 MG TABS tablet, TAKE 1 TABLET(10 MG) BY MOUTH DAILY, Disp: 90 tablet, Rfl: 3   Lancets (ACCU-CHEK SOFT TOUCH) lancets, Check blood sugar daily and record in log, Disp: 100 each, Rfl: 12   lisinopril-hydrochlorothiazide (ZESTORETIC) 20-25 MG tablet, TAKE 1 TABLET BY MOUTH DAILY, Disp: 90 tablet, Rfl: 3   meclizine (ANTIVERT) 25 MG tablet, Take 1 tablet (25 mg total) by mouth 3 (three) times daily as needed for dizziness., Disp: 30 tablet, Rfl: 0   metFORMIN (GLUCOPHAGE-XR) 500 MG 24 hr tablet, TAKE 1 TABLET(500 MG) BY MOUTH DAILY WITH BREAKFAST, Disp: 90 tablet, Rfl: 3   mycophenolate (CELLCEPT) 500 MG tablet, Take 1 tablet by mouth 2 (two) times daily. Written by derm, Disp: , Rfl:    nystatin cream (MYCOSTATIN), APPLY TOPICALLY TO THE AFFECTED AREA TWICE DAILY FOR 7 DAYS THEN AS NEEDED, Disp: 30 g, Rfl: 0   olopatadine (PATANOL) 0.1 % ophthalmic solution, Place 1 drop into both eyes 2 (two) times daily., Disp: 5 mL, Rfl: 5   oxyCODONE-acetaminophen (PERCOCET) 10-325 MG tablet, Take 1 tablet by mouth 5 (five) times daily as needed., Disp: , Rfl:    rosuvastatin (CRESTOR) 10 MG tablet, TAKE 1 TABLET(10 MG) BY MOUTH DAILY, Disp: 90 tablet, Rfl: 3   tiZANidine (ZANAFLEX) 4 MG tablet, Take 4 mg by mouth 2 (two)  times daily., Disp: , Rfl:    triamcinolone ointment (KENALOG) 0.1 %, Apply 1 application topically 2 (two) times daily., Disp: , Rfl:    TRULICITY 6.06 TK/1.6WF SOPN, ADMINISTER 0.75 MG UNDER THE SKIN 1 TIME A WEEK, Disp: 2 mL, Rfl: 5   zolpidem (AMBIEN) 10 MG tablet, Take 10  mg by mouth at bedtime., Disp: , Rfl:    Review of Systems:   ROS Negative unless otherwise specified per HPI.  Vitals:   Vitals:   09/27/21 1127  BP: 132/84  Pulse: 75  Resp: 16  Temp: 98.6 F (37 C)  TempSrc: Temporal  SpO2: 100%  Weight: 231 lb 3.2 oz (104.9 kg)  Height: '5\' 4"'$  (1.626 m)     Body mass index is 39.69 kg/m.  Physical Exam:   Physical Exam Vitals and nursing note reviewed.  Constitutional:      General: She is not in acute distress.    Appearance: She is well-developed. She is not ill-appearing or toxic-appearing.  Cardiovascular:     Rate and Rhythm: Normal rate. Rhythm irregularly irregular.     Pulses: Normal pulses.     Heart sounds: Normal heart sounds, S1 normal and S2 normal.  Pulmonary:     Effort: Pulmonary effort is normal.     Breath sounds: Normal breath sounds.  Skin:    General: Skin is warm and dry.  Neurological:     Mental Status: She is alert.     GCS: GCS eye subscore is 4. GCS verbal subscore is 5. GCS motor subscore is 6.  Psychiatric:        Speech: Speech normal.        Behavior: Behavior normal. Behavior is cooperative.     Assessment and Plan:   Irregular heartbeat EKG tracing is personally reviewed.  EKG notes irregular irregular rhythm with RVR. Patient also tells me that she is "allergic" to ASA and is already on maximum dosed carvedilol 25 mg BID. Discussed that due to findings of EKG, she would best be served in the ER. Patient is agreeable to plan.  Time spent with patient today was 30 minutes which consisted of chart review, discussing diagnosis, work up, treatment answering questions and documentation.   [name], PA-C

## 2021-09-27 NOTE — ED Triage Notes (Signed)
Pt arrives to ED with c/o atrial fibrillation. She notes she went to see neurology today for sleep apnea workup when they noticed an irregular heartbeat. She had a EKG done which showed Afib with RVR. She reports past hx of Afib w/ cardioversion in the 90's, but not on anticoagulation. She denies CP, chest tightness, SOB, palpitations.

## 2021-09-27 NOTE — Patient Instructions (Addendum)
It was great to see you!  Go to the ER at this time -- it is 2 miles away [place], [place] [address]  Please tell the ER team that you are being sent for Atrial Fibrillation with RVR

## 2021-09-27 NOTE — Telephone Encounter (Signed)
Called and spoke to Stacy Campbell at [facility] about pt being see today for EKG, due to having irregular heart rhythm.  She is not on blood thinner. Has hx Afib, had cardioversion for paroxysmal Afib in 1990's.  Pt informed that [name] NP can see her 1120 for EKG.  Pt informed and will see them today.

## 2021-09-27 NOTE — Progress Notes (Signed)
Subjective:    Patient ID: Stacy Campbell is a 66 y.o. female.  HPI    Star Age, MD, PhD [facility] [address] [place], [place] [address]  Dear Marye Round,  I saw your patient, Stacy Campbell, upon your kind request in my Sleep clinic today for initial consultation of her sleep disorder, in particular, concern for underlying obstructive sleep apnea.  The patient is unaccompanied today.  As you know, Stacy Campbell is a 66 year old right-handed woman with an underlying complex medical history of hypertension, hepatitis C, liver cirrhosis, anxiety, depression, asthma, arthritis, chronic low back pain, splenic artery aneurysm, eczema, reflux disease, history of A-fib, osteoporosis, and obesity, who reports snoring and difficulty initiating and maintaining sleep.  I reviewed your office note from 07/17/2021.  She is currently on generic Abilify, 5 mg in the morning, BuSpar, 10 mg strength 1 tablet twice daily, desvenlafaxine ER 100 mg daily, and she takes Ambien 10 mg at night for sleep.  In addition, she is on multiple other medications including naltrexone, tizanidine, oxycodone, and doxepin.  Full list of medications as below.  Her Epworth sleepiness score is 1/24, fatigue severity score is 61 out of 63.  She has not slept well for the past several years, reports stress, reports losing several family members in the past few years.  She lost her daughter in 2009, she lost her husband in 2016, she lost her son in 2021 and she lost her brother in March 2023.  She lives with her great grandson and with her brother.  She has a bedtime of around 630 and with medication falls asleep fairly quickly.  She also takes gabapentin as of last month for neck pain.  Of note, she has had some posterior neck pain and base of the head headaches.  She has woken up with a headache recently.  She has nocturia about once per average night.  Denies any family history  of sleep apnea.  Of note, she has a history of A-fib in the past, is currently not on a blood thinner and has a irregularly irregular heart rate today, denies any chest pain, or shortness of breath, visual symptoms.  She is on oxycodone 5 times a day, she takes Zanaflex twice daily, she takes Ambien every night and gabapentin is 300 mg daily.  She currently does not see a cardiologist, from what I can see in her chart, she saw cardiology in 2019.  She is not aware of any snoring.  Her Past Medical History Is Significant For: Past Medical History:  Diagnosis Date   Angio-edema    Anxiety    Arthritis    "lower back" (01/29/2017)   Asthma    Bone spur    spine   Chronic lower back pain    Cirrhosis of liver ([place]) 12/2018   Depression    Diabetes mellitus without complication (HCC)    Eczema    GERD (gastroesophageal reflux disease)    Hepatitis C    was treated for this   History of atrial fibrillation without current medication    cardioversion in 1990s   Hyperplastic polyp of intestine    Hypertension    Insomnia    Osteoporosis    Splenic artery aneurysm (HCC)    Urticaria     Her Past Surgical History Is Significant For: Past Surgical History:  Procedure Laterality Date   CARDIOVERSION  1990s   CHOLECYSTECTOMY N/A 01/22/2018   Procedure: LAPAROSCOPIC CHOLECYSTECTOMY ERAS  PATHWAY;  Surgeon: Clovis Riley, MD;  Location: [facility];  Service: General;  Laterality: N/A;   CYSTOSCOPY W/ URETERAL STENT PLACEMENT Left 01/29/2017   Procedure: CYSTOSCOPY WITH RETROGRADE PYELOGRAM/URETERAL STENT PLACEMENT;  Surgeon: Ardis Hughs, MD;  Location: [facility];  Service: Urology;  Laterality: Left;   CYSTOSCOPY WITH RETROGRADE PYELOGRAM, URETEROSCOPY AND STENT PLACEMENT Left 01/29/2017   CYSTOSCOPY WITH RETROGRADE PYELOGRAM/URETERAL STENT PLACEMENT    Her Family History Is Significant For: Family History  Problem Relation Age of Onset   Asthma Mother    Diabetes Mother    Asthma  Father    Diabetes Father    Cancer Father        prostate   Eczema Father    Alcoholism Brother    Asthma Brother    Diabetes Brother    Asthma Brother    Diabetes Brother    Asthma Brother    Diabetes Brother    Asthma Brother    Diabetes Brother    Hepatitis B Daughter    Breast cancer Neg Hx     Her Social History Is Significant For: Social History   Socioeconomic History   Marital status: Widowed    Spouse name: Not on file   Number of children: Not on file   Years of education: Not on file   Highest education level: Not on file  Occupational History   Not on file  Tobacco Use   Smoking status: Former    Packs/day: 0.25    Years: 37.00    Total pack years: 9.25    Types: Cigarettes    Quit date: 03/30/2008    Years since quitting: 13.5   Smokeless tobacco: Never  Vaping Use   Vaping Use: Never used  Substance and Sexual Activity   Alcohol use: No   Drug use: Never   Sexual activity: Not Currently    Birth control/protection: Post-menopausal  Other Topics Concern   Not on file  Social History Narrative   Caffiene none. Soda 2 daily.   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   Children 6, grandkids 18.       Social Determinants of Health   Financial Resource Strain: Low Risk  (07/28/2021)   Overall Financial Resource Strain (CARDIA)    Difficulty of Paying Living Expenses: Not hard at all  Food Insecurity: No Food Insecurity (07/28/2021)   Hunger Vital Sign    Worried About Running Out of Food in the Last Year: Never true    Ran Out of Food in the Last Year: Never true  Transportation Needs: No Transportation Needs (07/28/2021)   PRAPARE - Hydrologist (Medical): No    Lack of Transportation (Non-Medical): No  Physical Activity: Insufficiently Active (07/28/2021)   Exercise Vital Sign    Days of Exercise per Week: 2 days    Minutes of Exercise per Session: 50 min  Stress: Stress Concern Present (07/28/2021)   [place]    Feeling of Stress : To some extent  Social Connections: Moderately Isolated (07/28/2021)   Social Connection and Isolation Panel [NHANES]    Frequency of Communication with Friends and Family: More than three times a week    Frequency of Social Gatherings with Friends and Family: Twice a week    Attends Religious Services: More than 4 times per year    Active Member of [employer] or Organizations: No    Attends Archivist Meetings:  Never    Marital Status: Widowed    Her Allergies Are:  Allergies  Allergen Reactions   Hydrocodone Itching   Linalool    Methylisothiazolinone Other (See Comments)    Other reaction(s): Other (See Comments) Positive patch test Positive patch test    Propylene Glycol    Aspirin Itching and Other (See Comments)    Lower dose doesn't make her itch (she takes it every day)   Eggs Or Egg-Derived Products Nausea Only and Other (See Comments)    No reaction if in another food   Hydrocodone-Guaifenesin Rash  :   Her Current Medications Are:  Outpatient Encounter Medications as of 09/27/2021  Medication Sig   albuterol (VENTOLIN HFA) 108 (90 Base) MCG/ACT inhaler INHALE 2 PUFFS INTO THE LUNGS EVERY 6 HOURS AS NEEDED FOR WHEEZING OR SHORTNESS OF BREATH   amLODipine (NORVASC) 10 MG tablet TAKE 1 TABLET(10 MG) BY MOUTH DAILY   ARIPiprazole (ABILIFY) 5 MG tablet Take 1 tablet (5 mg total) by mouth daily.   ARNUITY ELLIPTA 100 MCG/ACT AEPB INHALE 1 PUFF INTO THE LUNGS DAILY   busPIRone (BUSPAR) 5 MG tablet Take 5 mg by mouth 2 (two) times daily.   carvedilol (COREG) 25 MG tablet TAKE 1 TABLET(25 MG) BY MOUTH TWICE DAILY WITH A MEAL   desvenlafaxine (PRISTIQ) 50 MG 24 hr tablet Take 50 mg by mouth daily.   doxepin (SINEQUAN) 25 MG capsule Take 25 mg by mouth 3 (three) times daily as needed.   EPINEPHrine 0.3 mg/0.3 mL IJ SOAJ injection Use as directed for severe allergic reaction    gabapentin (NEURONTIN) 300 MG capsule Take 1 capsule (300 mg total) by mouth at bedtime.   glucose blood test strip Test blood sugar daily and record on blood sugar log   JARDIANCE 10 MG TABS tablet TAKE 1 TABLET(10 MG) BY MOUTH DAILY   Lancets (ACCU-CHEK SOFT TOUCH) lancets Check blood sugar daily and record in log   lisinopril-hydrochlorothiazide (ZESTORETIC) 20-25 MG tablet TAKE 1 TABLET BY MOUTH DAILY   meclizine (ANTIVERT) 25 MG tablet Take 1 tablet (25 mg total) by mouth 3 (three) times daily as needed for dizziness.   metFORMIN (GLUCOPHAGE-XR) 500 MG 24 hr tablet TAKE 1 TABLET(500 MG) BY MOUTH DAILY WITH BREAKFAST   mycophenolate (CELLCEPT) 500 MG tablet Take 1 tablet by mouth 2 (two) times daily. Written by derm   nystatin cream (MYCOSTATIN) APPLY TOPICALLY TO THE AFFECTED AREA TWICE DAILY FOR 7 DAYS THEN AS NEEDED   olopatadine (PATANOL) 0.1 % ophthalmic solution Place 1 drop into both eyes 2 (two) times daily.   oxyCODONE-acetaminophen (PERCOCET) 10-325 MG tablet Take 1 tablet by mouth 5 (five) times daily as needed.   rosuvastatin (CRESTOR) 10 MG tablet TAKE 1 TABLET(10 MG) BY MOUTH DAILY   tiZANidine (ZANAFLEX) 4 MG tablet Take 4 mg by mouth 2 (two) times daily.   triamcinolone ointment (KENALOG) 0.1 % Apply 1 application topically 2 (two) times daily.   TRULICITY 9.51 OA/4.1YS SOPN ADMINISTER 0.75 MG UNDER THE SKIN 1 TIME A WEEK   zolpidem (AMBIEN) 10 MG tablet Take 10 mg by mouth at bedtime.   No facility-administered encounter medications on file as of 09/27/2021.  :   Review of Systems:  Out of a complete 14 point review of systems, all are reviewed and negative with the exception of these symptoms as listed below:  Review of Systems  Neurological:        Having hard time going to sleep. On average  3-4 hours a night. No snoring or gaps in breathing. ESS  1 FSS 61.    Objective:  Neurological Exam  Physical Exam Physical Examination:   Vitals:   09/27/21 0853  09/27/21 0904  BP: (!) 154/106 135/89  Pulse: (!) 106 (!) 104    General Examination: The patient is a very pleasant 66 y.o. female in no acute distress. She appears well-developed and well-nourished and well groomed.   HEENT: Normocephalic, atraumatic, pupils are equal, round and reactive to light, extraocular tracking is good without limitation to gaze excursion or nystagmus noted. Hearing is grossly intact. Face is symmetric with normal facial animation. Speech is clear with no dysarthria noted. There is no hypophonia. There is no lip, neck/head, jaw or voice tremor. Neck is supple with full range of passive and active motion. There are no carotid bruits on auscultation. Oropharynx exam reveals: moderate mouth dryness, full dentures, with mild underbite noted. Moderate airway crowding, due to smaller airway, longer uvula, tonsils of 1-2 + on the R and 1+ on the L. Mallampati is class II. Tongue protrudes centrally and palate elevates symmetrically.  Chest: Clear to auscultation without wheezing, rhonchi or crackles noted.  Heart: S1+S2+0, irregularly irregular, no murmur, rubs or gallops noted. Rapid heart beat.   Abdomen: Soft, non-tender and non-distended with normal bowel sounds appreciated on auscultation.  Extremities: There is trace pitting edema in the distal lower extremities bilaterally.   Skin: Warm and dry without trophic changes noted.   Musculoskeletal: exam reveals neck and shoulder pain.    Neurologically:  Mental status: The patient is awake, alert and oriented in all 4 spheres. Her immediate and remote memory, attention, language skills and fund of knowledge are appropriate. There is no evidence of aphasia, agnosia, apraxia or anomia. Speech is clear with normal prosody and enunciation. Thought process is linear. Mood is constricted and affect is blunted.  Cranial nerves II - XII are as described above under HEENT exam.  Motor exam: Normal bulk, strength and tone is noted.  There is no obvious tremor. Fine motor skills and coordination: grossly intact.  Cerebellar testing: No dysmetria or intention tremor. There is no truncal or gait ataxia.  Sensory exam: intact to light touch in the upper and lower extremities.  Gait, station and balance: She stands easily. No veering to one side is noted. No leaning to one side is noted. Posture is age-appropriate and stance is narrow based. Gait shows normal stride length and normal pace. No problems turning are noted.   Assessment and Plan:  In summary, Stacy Campbell is a very pleasant 66 y.o.-year old female with an underlying complex medical history of hypertension, hepatitis C, liver cirrhosis, anxiety, depression, asthma, arthritis, chronic low back pain, splenic artery aneurysm, eczema, reflux disease, history of A-fib, osteoporosis, and obesity, who presents for evaluation of her chronic sleep disturbance, particularly difficulty initiating and maintaining sleep.  She is on high risk medications including narcotic pain medication, benzodiazepine receptor agonist, muscle relaxer, gabapentin, Abilify, generic Pristiq.  She may be in atrial fibrillation currently, she has elevated blood pressure and heart rate and irregularly irregular heart rhythm.  We called her primary care office, unfortunately, her PCP, Dr. Jonni Sanger is out this week.  The patient denies any chest pain, shortness of breath, or severe headache currently, no visual symptoms.  Nevertheless, she is encouraged to get checked out for her heart rhythm, she may benefit from seeing cardiology again, she may need to be on a blood thinner.  We were able to get her an appointment with her PCP office today.  She will see [name], PA for EKG at 11:20 today.  Patient is agreeable.   She may may be at risk for underlying obstructive sleep apnea.  I talked to the patient about this diagnosis and treatment options as well as prognosis at length today.   I explained, in  particular, the risks and ramifications of untreated moderate to severe OSA, especially with respect to developing cardiovascular disease down the road, including congestive heart failure (CHF), difficult to treat hypertension, cardiac arrhythmias (particularly A-fib), neurovascular complications including TIA, stroke and dementia. Even type 2 diabetes has, in part, been linked to untreated OSA. Symptoms of untreated OSA may include (but may not be limited to) daytime sleepiness, nocturia (i.e. frequent nighttime urination), memory problems, mood irritability and suboptimally controlled or worsening mood disorder such as depression and/or anxiety, lack of energy, lack of motivation, physical discomfort, as well as recurrent headaches, especially morning or nocturnal headaches. We talked about the importance of maintaining a healthy lifestyle and striving for healthy weight.  I recommended the following at this time: sleep study.  I outlined the differences between a laboratory attended sleep study which is considered more comprehensive and accurate over the option of a home sleep test (HST); the latter may lead to underestimation of sleep disordered breathing in some instances and does not help with diagnosing upper airway resistance syndrome and is not accurate enough to diagnose primary central sleep apnea typically. I explained the different sleep test procedures to the patient in detail and also outlined possible surgical and non-surgical treatment options of OSA, including the use of a pressure airway pressure (PAP) device (ie CPAP, AutoPAP/APAP or BiPAP in certain circumstances), a custom-made dental device (aka oral appliance, which would require a referral to a specialist dentist or orthodontist typically, and is generally speaking not considered a good choice for patients with full dentures or edentulous state), upper airway surgical options, such as traditional UPPP (which is not considered a first-line  treatment) or the Inspire device (hypoglossal nerve stimulator, which would involve a referral for consultation with an ENT surgeon, after careful selection, following inclusion criteria). I explained the PAP treatment option to the patient in detail, as this is generally considered first-line treatment.  The patient indicated that she would be willing to try PAP therapy, if the need arises. I explained the importance of being compliant with PAP treatment, not only for insurance purposes but primarily to improve patient's symptoms symptoms, and for the patient's long term health benefit, including to reduce Her cardiovascular risks longer-term.    We will pick up our discussion about the next steps and treatment options after testing.  We will keep her posted as to the test results by phone call and/or MyChart messaging where possible.  We will plan to follow-up in sleep clinic accordingly as well.  I answered all her questions today and the patient was in agreement.   I encouraged her to call with any interim questions, concerns, problems or updates or email [place] through [place].  Generally speaking, sleep test authorizations may take up to 2 weeks, sometimes less, sometimes longer, the patient is encouraged to get in touch with [place] if they do not hear back from the sleep lab staff directly within the next 2 weeks.  Thank you very much for allowing me to participate in the care of this nice patient. If I can be of any further assistance to  you please do not hesitate to call me at [phone].  Sincerely,   Star Age, MD, PhD  This was an extended visit over over 1 hour.

## 2021-09-27 NOTE — Discharge Instructions (Addendum)
Stop taking amlodipine.  Take Cardizem instead.  Also take the blood thinner Eliquis.  The atrial fibrillation clinic should call you for an appointment in the next 2 or 3 days.  Return to the ED with chest pain, shortness of breath, dizziness, lightheadedness, any other concerns.  Information on my medicine - ELIQUIS (apixaban)  This medication education was reviewed with me or my healthcare representative as part of my discharge preparation.    Why was Eliquis prescribed for you? Eliquis was prescribed for you to reduce the risk of a blood clot forming that can cause a stroke if you have a medical condition called atrial fibrillation (a type of irregular heartbeat).  What do You need to know about Eliquis ? Take your Eliquis TWICE DAILY - one tablet in the morning and one tablet in the evening with or without food. If you have difficulty swallowing the tablet whole please discuss with your pharmacist how to take the medication safely.  Take Eliquis exactly as prescribed by your doctor and DO NOT stop taking Eliquis without talking to the doctor who prescribed the medication.  Stopping may increase your risk of developing a stroke.  Refill your prescription before you run out.  After discharge, you should have regular check-up appointments with your healthcare provider that is prescribing your Eliquis.  In the future your dose may need to be changed if your kidney function or weight changes by a significant amount or as you get older.  What do you do if you miss a dose? If you miss a dose, take it as soon as you remember on the same day and resume taking twice daily.  Do not take more than one dose of ELIQUIS at the same time to make up a missed dose.  Important Safety Information A possible side effect of Eliquis is bleeding. You should call your healthcare provider right away if you experience any of the following: Bleeding from an injury or your nose that does not stop. Unusual  colored urine (red or dark brown) or unusual colored stools (red or black). Unusual bruising for unknown reasons. A serious fall or if you hit your head (even if there is no bleeding).  Some medicines may interact with Eliquis and might increase your risk of bleeding or clotting while on Eliquis. To help avoid this, consult your healthcare provider or pharmacist prior to using any new prescription or non-prescription medications, including herbals, vitamins, non-steroidal anti-inflammatory drugs (NSAIDs) and supplements.  This website has more information on Eliquis (apixaban): http://www.eliquis.com/eliquis/home

## 2021-09-27 NOTE — ED Provider Notes (Signed)
Care assumed from Dr. Pearline Cables.  Patient with atrial fibrillation which she has not had for many years.  Denies chest pain, shortness of breath, nausea or vomiting.  No dizziness or lightheadedness.  She is on a Cardizem drip awaiting improvement in her rate.  She has been started on Eliquis.  Cardiology wishes to be contacted back if heart rate does not improve.  Her Norvasc is being switched to Cardizem by Dr. Pearline Cables.  D/w Dr. Sallyanne Kuster.  He agrees patient likely can go home with rate control.  Recommends transition to Cardizem extended release 3 60 mg.  We will also continue Eliquis.  Patient feels well and does not want to come in the hospital.  Heart rate maintained in the 80s and 90s.  She denies any chest pain or shortness of breath.  She is transition to p.o. Cardizem.  Stop taking amlodipine.  Start Eliquis.  Follow-up with the atrial fibrillation clinic.  Return precautions discussed   Ezequiel Essex, MD 09/27/21 2056

## 2021-10-02 ENCOUNTER — Encounter: Payer: Self-pay | Admitting: *Deleted

## 2021-10-04 ENCOUNTER — Ambulatory Visit (HOSPITAL_COMMUNITY)
Admission: RE | Admit: 2021-10-04 | Discharge: 2021-10-04 | Disposition: A | Discharge status: Home / Self Care | Payer: Medicare HMO | Source: Ambulatory Visit | Attending: Physician Assistant | Admitting: Physician Assistant

## 2021-10-04 ENCOUNTER — Encounter (HOSPITAL_COMMUNITY): Payer: Self-pay | Admitting: Physician Assistant

## 2021-10-04 VITALS — BP 108/90 | HR 137 | Ht 64.0 in | Wt 232.6 lb

## 2021-10-04 DIAGNOSIS — D6869 Other thrombophilia: Secondary | ICD-10-CM | POA: Diagnosis not present

## 2021-10-04 DIAGNOSIS — I10 Essential (primary) hypertension: Secondary | ICD-10-CM | POA: Diagnosis not present

## 2021-10-04 DIAGNOSIS — G4733 Obstructive sleep apnea (adult) (pediatric): Secondary | ICD-10-CM | POA: Insufficient documentation

## 2021-10-04 DIAGNOSIS — Z6839 Body mass index (BMI) 39.0-39.9, adult: Secondary | ICD-10-CM | POA: Insufficient documentation

## 2021-10-04 DIAGNOSIS — I4819 Other persistent atrial fibrillation: Secondary | ICD-10-CM | POA: Insufficient documentation

## 2021-10-04 DIAGNOSIS — Z7984 Long term (current) use of oral hypoglycemic drugs: Secondary | ICD-10-CM | POA: Diagnosis not present

## 2021-10-04 DIAGNOSIS — E669 Obesity, unspecified: Secondary | ICD-10-CM | POA: Insufficient documentation

## 2021-10-04 DIAGNOSIS — Z7901 Long term (current) use of anticoagulants: Secondary | ICD-10-CM | POA: Diagnosis not present

## 2021-10-04 DIAGNOSIS — Z8679 Personal history of other diseases of the circulatory system: Secondary | ICD-10-CM | POA: Insufficient documentation

## 2021-10-04 DIAGNOSIS — Z794 Long term (current) use of insulin: Secondary | ICD-10-CM | POA: Diagnosis not present

## 2021-10-04 DIAGNOSIS — Z79899 Other long term (current) drug therapy: Secondary | ICD-10-CM | POA: Insufficient documentation

## 2021-10-04 DIAGNOSIS — E119 Type 2 diabetes mellitus without complications: Secondary | ICD-10-CM | POA: Insufficient documentation

## 2021-10-04 MED ORDER — APIXABAN 5 MG PO TABS: 5.0000 mg | ORAL_TABLET | Freq: Two times a day (BID) | ORAL | 4 refills | Status: DC

## 2021-10-04 MED ORDER — DILTIAZEM HCL ER COATED BEADS 360 MG PO CP24: 360.0000 mg | ORAL_CAPSULE | Freq: Every day | ORAL | 3 refills | Status: DC

## 2021-10-04 NOTE — Patient Instructions (Signed)
Start Cardizem '360mg'$  once a day   Cardioversion scheduled for Wednesday, October 11th  - Arrive at the [facility] and go to admitting at 830am  - Do not eat or drink anything after midnight the night prior to your procedure.  - Take all your morning medication (except diabetic medications) with a sip of water prior to arrival.  - You will not be able to drive home after your procedure.  - Do NOT miss any doses of your blood thinner - if you should miss a dose please notify our office immediately.  - If you feel as if you go back into normal rhythm prior to scheduled cardioversion, please notify our office immediately. If your procedure is canceled in the cardioversion suite you will be charged a cancellation fee.

## 2021-10-04 NOTE — Progress Notes (Signed)
Primary Care Physician: [name], MD Primary Cardiologist: Dr Ellyn Hack (new) Primary Electrophysiologist: none Referring Physician: [facility]   Stacy Campbell is a 66 y.o. female with a history of hepatitis C s/p treatment, DM, HTN, atrial fibrillation who presents for consultation in the [facility].  The patient was initially diagnosed with atrial fibrillation remotely and reports having had an ablation at that time. She was seen by Dr Rexene Alberts for a sleep consult and her heart was irregular on auscultation. She was sent to her PCP for an ECG which showed afib with RVR and she was sent to the ED. Patient was started on Eliquis for a CHADS2VASC score of 4 and her amlodipine was changed to diltiazem for rate control. She was discharged in rate controlled afib. Patient remains unaware of her arrhythmia but may have a little more SOB on exertion. She has not picked up her diltiazem yet because her pharmacy did not have it in stock. No bleeding issues on anticoagulation.   Today, she denies symptoms of palpitations, chest pain, shortness of breath, orthopnea, PND, lower extremity edema, dizziness, presyncope, syncope, bleeding, or neurologic sequela. The patient is tolerating medications without difficulties and is otherwise without complaint today.    Atrial Fibrillation Risk Factors:  she does have symptoms or diagnosis of sleep apnea. she is followed by Dr Rexene Alberts for sleep. she does not have a history of rheumatic fever. she does not have a history of alcohol use. The patient does not have a history of early familial atrial fibrillation or other arrhythmias.  she has a BMI of Body mass index is 39.93 kg/m.Marland Kitchen Filed Weights   10/04/21 1024  Weight: 105.5 kg    Family History  Problem Relation Age of Onset   Asthma Mother    Diabetes Mother    Asthma Father    Diabetes Father    Cancer Father        prostate   Eczema Father    Alcoholism  Brother    Asthma Brother    Diabetes Brother    Asthma Brother    Diabetes Brother    Asthma Brother    Diabetes Brother    Asthma Brother    Diabetes Brother    Hepatitis B Daughter    Breast cancer Neg Hx      Atrial Fibrillation Management history:  Previous antiarrhythmic drugs: none Previous cardioversions: 1990's Previous ablations: none CHADS2VASC score: 4 Anticoagulation history: Eliquis   Past Medical History:  Diagnosis Date   Angio-edema    Anxiety    Arthritis    "lower back" (01/29/2017)   Asthma    Bone spur    spine   Chronic lower back pain    Cirrhosis of liver ([place]) 12/2018   Depression    Diabetes mellitus without complication (HCC)    Eczema    GERD (gastroesophageal reflux disease)    Hepatitis C    was treated for this   History of atrial fibrillation without current medication    cardioversion in 1990s   Hyperplastic polyp of intestine    Hypertension    Insomnia    Osteoarthritis    Osteoporosis    Splenic artery aneurysm ([place])    Urticaria    Past Surgical History:  Procedure Laterality Date   CARDIOVERSION  1990s   CHOLECYSTECTOMY N/A 01/22/2018   Procedure: LAPAROSCOPIC CHOLECYSTECTOMY ERAS PATHWAY;  Surgeon: Clovis Riley, MD;  Location: [facility];  Service: General;  Laterality: N/A;   CYSTOSCOPY W/ URETERAL STENT PLACEMENT Left 01/29/2017   Procedure: CYSTOSCOPY WITH RETROGRADE PYELOGRAM/URETERAL STENT PLACEMENT;  Surgeon: Ardis Hughs, MD;  Location: [facility];  Service: Urology;  Laterality: Left;   CYSTOSCOPY WITH RETROGRADE PYELOGRAM, URETEROSCOPY AND STENT PLACEMENT Left 01/29/2017   CYSTOSCOPY WITH RETROGRADE PYELOGRAM/URETERAL STENT PLACEMENT    Current Outpatient Medications  Medication Sig Dispense Refill   albuterol (VENTOLIN HFA) 108 (90 Base) MCG/ACT inhaler INHALE 2 PUFFS INTO THE LUNGS EVERY 6 HOURS AS NEEDED FOR WHEEZING OR SHORTNESS OF BREATH 54 g 1   ARIPiprazole (ABILIFY) 5 MG tablet Take 1 tablet (5 mg  total) by mouth daily. 90 tablet 0   ARNUITY ELLIPTA 100 MCG/ACT AEPB INHALE 1 PUFF INTO THE LUNGS DAILY 30 each 2   Aspirin-Salicylamide-Caffeine (BC HEADACHE PO) Take by mouth.     busPIRone (BUSPAR) 10 MG tablet Take 10 mg by mouth 3 (three) times daily.     busPIRone (BUSPAR) 5 MG tablet Take 5 mg by mouth 2 (two) times daily.     carvedilol (COREG) 25 MG tablet TAKE 1 TABLET(25 MG) BY MOUTH TWICE DAILY WITH A MEAL 180 tablet 3   cetirizine (ZYRTEC) 10 MG tablet Take by mouth.     desvenlafaxine (PRISTIQ) 50 MG 24 hr tablet Take 50 mg by mouth daily.     doxepin (SINEQUAN) 25 MG capsule Take 25 mg by mouth 3 (three) times daily as needed.     EPINEPHrine 0.3 mg/0.3 mL IJ SOAJ injection Use as directed for severe allergic reaction 2 Device 2   gabapentin (NEURONTIN) 300 MG capsule Take 1 capsule (300 mg total) by mouth at bedtime. 90 capsule 1   glucose blood test strip Test blood sugar daily and record on blood sugar log 100 each 12   JARDIANCE 10 MG TABS tablet TAKE 1 TABLET(10 MG) BY MOUTH DAILY 90 tablet 3   Lancets (ACCU-CHEK SOFT TOUCH) lancets Check blood sugar daily and record in log 100 each 12   lisinopril-hydrochlorothiazide (ZESTORETIC) 20-25 MG tablet TAKE 1 TABLET BY MOUTH DAILY 90 tablet 3   meclizine (ANTIVERT) 25 MG tablet Take 1 tablet (25 mg total) by mouth 3 (three) times daily as needed for dizziness. 30 tablet 0   metFORMIN (GLUCOPHAGE-XR) 500 MG 24 hr tablet TAKE 1 TABLET(500 MG) BY MOUTH DAILY WITH BREAKFAST 90 tablet 3   mycophenolate (CELLCEPT) 500 MG tablet Take 2 tablets by mouth 2 (two) times daily.     nystatin cream (MYCOSTATIN) APPLY TOPICALLY TO THE AFFECTED AREA TWICE DAILY FOR 7 DAYS THEN AS NEEDED 30 g 0   olopatadine (PATANOL) 0.1 % ophthalmic solution Place 1 drop into both eyes 2 (two) times daily. 5 mL 5   oxyCODONE-acetaminophen (PERCOCET) 10-325 MG tablet Take 1 tablet by mouth 5 (five) times daily as needed.     prazosin (MINIPRESS) 2 MG capsule Take  2 mg by mouth at bedtime.     rosuvastatin (CRESTOR) 10 MG tablet TAKE 1 TABLET(10 MG) BY MOUTH DAILY 90 tablet 3   tacrolimus (PROTOPIC) 0.1 % ointment Apply 1 Application topically 2 (two) times daily.     tiZANidine (ZANAFLEX) 4 MG tablet Take 4 mg by mouth 2 (two) times daily.     triamcinolone ointment (KENALOG) 0.1 % Apply 1 application topically 2 (two) times daily.     TRULICITY 3.15 QM/0.8QP SOPN ADMINISTER 0.75 MG UNDER THE SKIN 1 TIME A WEEK 2 mL 5   zolpidem (AMBIEN CR) 12.5 MG  CR tablet Take 12.5 mg by mouth at bedtime.     apixaban (ELIQUIS) 5 MG TABS tablet Take 1 tablet (5 mg total) by mouth 2 (two) times daily. 60 tablet 4   diltiazem (CARDIZEM CD) 360 MG 24 hr capsule Take 1 capsule (360 mg total) by mouth daily. 30 capsule 3   No current facility-administered medications for this encounter.    Allergies  Allergen Reactions   Hydrocodone Itching   Linalool    Methylisothiazolinone Other (See Comments)    Other reaction(s): Other (See Comments) Positive patch test Positive patch test    Propylene Glycol    Aspirin Itching and Other (See Comments)    Lower dose doesn't make her itch (she takes it every day)   Eggs Or Egg-Derived Products Nausea Only and Other (See Comments)    No reaction if in another food   Hydrocodone-Guaifenesin Rash    Social History   Socioeconomic History   Marital status: Widowed    Spouse name: Not on file   Number of children: Not on file   Years of education: Not on file   Highest education level: Not on file  Occupational History   Not on file  Tobacco Use   Smoking status: Former    Packs/day: 0.25    Years: 37.00    Total pack years: 9.25    Types: Cigarettes    Quit date: 03/30/2008    Years since quitting: 13.5   Smokeless tobacco: Never   Tobacco comments:    Former smoker 10/04/21  Vaping Use   Vaping Use: Never used  Substance and Sexual Activity   Alcohol use: No   Drug use: Never   Sexual activity: Not  Currently    Birth control/protection: Post-menopausal  Other Topics Concern   Not on file  Social History Narrative   Caffiene none. Soda 2 daily.   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   Children 6, grandkids 18.       Social Determinants of Health   Financial Resource Strain: Low Risk  (07/28/2021)   Overall Financial Resource Strain (CARDIA)    Difficulty of Paying Living Expenses: Not hard at all  Food Insecurity: No Food Insecurity (07/28/2021)   Hunger Vital Sign    Worried About Running Out of Food in the Last Year: Never true    Ran Out of Food in the Last Year: Never true  Transportation Needs: No Transportation Needs (07/28/2021)   PRAPARE - Hydrologist (Medical): No    Lack of Transportation (Non-Medical): No  Physical Activity: Insufficiently Active (07/28/2021)   Exercise Vital Sign    Days of Exercise per Week: 2 days    Minutes of Exercise per Session: 50 min  Stress: Stress Concern Present (07/28/2021)   [place]    Feeling of Stress : To some extent  Social Connections: Moderately Isolated (07/28/2021)   Social Connection and Isolation Panel [NHANES]    Frequency of Communication with Friends and Family: More than three times a week    Frequency of Social Gatherings with Friends and Family: Twice a week    Attends Religious Services: More than 4 times per year    Active Member of [employer] or Organizations: No    Attends Archivist Meetings: Never    Marital Status: Widowed  Intimate Partner Violence: Not At Risk (07/28/2021)   Humiliation, Afraid, Rape, and Kick questionnaire    Fear  of Current or Ex-Partner: No    Emotionally Abused: No    Physically Abused: No    Sexually Abused: No     ROS- All systems are reviewed and negative except as per the HPI above.  Physical Exam: Vitals:   10/04/21 1024  BP: (!) 108/90  Pulse: (!) 137  Weight: 105.5  kg  Height: '5\' 4"'$  (1.626 m)    GEN- The patient is a well appearing female, alert and oriented x 3 today.   Head- normocephalic, atraumatic Eyes-  Sclera clear, conjunctiva pink Ears- hearing intact Oropharynx- clear Neck- supple  Lungs- Clear to ausculation bilaterally, normal work of breathing Heart- irregular rate and rhythm, no murmurs, rubs or gallops  GI- soft, NT, ND, + BS Extremities- no clubbing, cyanosis, or edema MS- no significant deformity or atrophy Skin- no rash or lesion Psych- euthymic mood, full affect Neuro- strength and sensation are intact  Wt Readings from Last 3 Encounters:  10/04/21 105.5 kg  09/27/21 104.9 kg  09/27/21 105.4 kg    EKG today demonstrates  Afib with RVR Vent. rate 137 BPM PR interval * ms QRS duration 68 ms QT/QTcB 264/398 ms  Echo 01/30/17 demonstrated  Left ventricle: The cavity size was normal. There was mild focal    basal hypertrophy of the septum. Systolic function was vigorous.    The estimated ejection fraction was in the range of 65% to 70%    with acceleration of flow through outflow tract (peak velocity    2.74ms). Wall motion was normal; there were no regional wall    motion abnormalities. Features are consistent with a pseudonormal    left ventricular filling pattern, with concomitant abnormal    relaxation and increased filling pressure (grade 2 diastolic    dysfunction).  - Aortic valve: Trileaflet; mildly thickened, mildly calcified    leaflets. Transvalvular velocity was within the normal range.    There was no stenosis.  - Mitral valve: Calcified annulus. Mildly thickened leaflets .    There was trivial regurgitation.  - Pulmonary arteries: PA peak pressure: 55 mm Hg (S).  Epic records are reviewed at length today  CHA2DS2-VASc Score = 4  The patient's score is based upon: CHF History: 0 HTN History: 1 Diabetes History: 1 Stroke History: 0 Vascular Disease History: 0 Age Score: 1 Gender Score: 1        ASSESSMENT AND PLAN: 1. Persistent Atrial Fibrillation (ICD10:  I48.19) The patient's CHA2DS2-VASc score is 4, indicating a 4.8% annual risk of stroke.   General education about afib provided and questions answered. We also discussed her stroke risk and the risks and benefits of anticoagulation. Continue Eliquis 5 mg BID Will arrange for DCCV after 3 weeks of uninterrupted anticoagulation.  Start diltiazem 360 mg daily (Rx sent to different pharmacy) Continue carvedilol 25 mg BID Will have her return to clinic next week to evaluate rate control.   2. Secondary Hypercoagulable State (ICD10:  D68.69) The patient is at significant risk for stroke/thromboembolism based upon her CHA2DS2-VASc Score of 4.  Continue Apixaban (Eliquis).   3. Obesity Body mass index is 39.93 kg/m. Lifestyle modification was discussed at length including regular exercise and weight reduction.  4. Suspected obstructive sleep apnea The importance of adequate treatment of sleep apnea was discussed today in order to improve our ability to maintain sinus rhythm long term. Seen by Dr ARexene Albertswith plan for sleep study.  5. HTN Stable, start diltiazem as above.   Follow up in the  AF clinic next week.    [facility] [address] [place], [place] [address] [phone] 10/04/2021 10:55 AM

## 2021-10-06 ENCOUNTER — Encounter: Payer: Self-pay | Admitting: Family Medicine

## 2021-10-06 ENCOUNTER — Ambulatory Visit (INDEPENDENT_AMBULATORY_CARE_PROVIDER_SITE_OTHER): Payer: Medicare HMO | Admitting: Family Medicine

## 2021-10-06 VITALS — BP 104/60 | HR 105 | Temp 98.3°F | Ht 64.0 in | Wt 236.4 lb

## 2021-10-06 DIAGNOSIS — I10 Essential (primary) hypertension: Secondary | ICD-10-CM

## 2021-10-06 DIAGNOSIS — I4819 Other persistent atrial fibrillation: Secondary | ICD-10-CM

## 2021-10-06 DIAGNOSIS — E119 Type 2 diabetes mellitus without complications: Secondary | ICD-10-CM

## 2021-10-06 DIAGNOSIS — Z23 Encounter for immunization: Secondary | ICD-10-CM

## 2021-10-06 LAB — POCT GLYCOSYLATED HEMOGLOBIN (HGB A1C): Hemoglobin A1C: 6.2 % — AB (ref 4.0–5.6)

## 2021-10-06 NOTE — Patient Instructions (Addendum)
Please follow up as scheduled for your next visit with me: 12/12/2021   Your diabetes remains well controlled with an A1c of 6.2 today.  I'll follow along with the heart doctors. Your heart rate is better today.   If you have any questions or concerns, please don't hesitate to send me a message via MyChart or call the office at [phone]. Thank you for visiting with Stacy Campbell today! It's our pleasure caring for you.

## 2021-10-06 NOTE — Progress Notes (Signed)
Subjective  CC:  Chief Complaint  Patient presents with   Atrial Fibrillation    Pt stated that she was in ED on 09/27/2021 for A Fib. PT has been fine since being released     HPI: Stacy Campbell is a 66 y.o. female who presents to the office today for follow up of diabetes and problems listed above in the chief complaint.  ED follow-up: I reviewed notes from 920.  A-fib with RVR: Treated with IV Cardizem drip and discharged to home.  Since, she has had follow-up with cardiology in A-fib clinic.  She has finally been able to get Cardizem and has been on it for a little less than a week.  She has never felt her arrhythmia or tachycardia.  She has no palpitations or chest pain.  She is on Eliquis without adverse effects or bleeding.  Physically feels fine.  Has follow-up next week to recheck her weight.  Hoping to be ablation after she completes 3 weeks of anticoagulation.  Left Diabetes follow up: Her diabetic control is reported as Unchanged.  She continues on her medications without adverse effects.  She denies exertional CP or SOB or symptomatic hypoglycemia. She denies foot sores or paresthesias.  Hypertension: Now on Cardizem CD360 instead of amlodipine.  Tolerating well.  Blood pressures running low but she does not have any symptoms on hypotension. Wt Readings from Last 3 Encounters:  10/06/21 236 lb 6.4 oz (107.2 kg)  10/04/21 232 lb 9.6 oz (105.5 kg)  09/27/21 231 lb 3.2 oz (104.9 kg)    BP Readings from Last 3 Encounters:  10/06/21 104/60  10/04/21 (!) 108/90  09/27/21 (!) 142/86    Assessment  1. Persistent atrial fibrillation ([place])   2. Essential hypertension   3. Controlled type 2 diabetes mellitus without complication, without long-term current use of insulin ([place])   4. Need for immunization against influenza      Plan  Persistent A-fib: Rate controlled on Cardizem orally.  Continue Eliquis.  Has follow-up in A-fib clinic next week Diabetes is currently very  well controlled.  Continue Jardiance 25 daily and metformin 500 twice daily  hypertension on lisinopril HCTZ and Cardizem.  Monitor for low blood pressure symptoms.  Heart rate is controlled .  Follow up: Marland Kitchen  As scheduled in December Orders Placed This Encounter  Procedures   Flu Vaccine QUAD High Dose(Fluad)   POCT HgB A1C   No orders of the defined types were placed in this encounter.     Immunization History  Administered Date(s) Administered   Fluad Quad(high Dose 65+) 11/18/2020, 10/06/2021   Hepatitis B, adult 10/12/2014, 11/09/2014, 04/11/2015   Influenza Split 11/14/2010, 10/09/2011   Influenza Whole 10/13/2007, 01/10/2009, 11/22/2009   Influenza,inj,Quad PF,6+ Mos 10/10/2012, 10/08/2013, 10/15/2014, 09/29/2015, 11/06/2016, 09/10/2017, 10/15/2018, 09/30/2019   PFIZER(Purple Top)SARS-COV-2 Vaccination 03/20/2019, 04/15/2019, 10/15/2019   PNEUMOCOCCAL CONJUGATE-20 11/18/2020   Pneumococcal Polysaccharide-23 01/10/2009   Td 01/09/1999, 01/10/2009    Diabetes Related Lab Review: Lab Results  Component Value Date   HGBA1C 6.2 (A) 10/06/2021   HGBA1C 6.4 (A) 06/12/2021   HGBA1C 6.6 (A) 02/24/2021    Lab Results  Component Value Date   MICROALBUR <0.7 02/24/2021   Lab Results  Component Value Date   CREATININE 0.77 09/27/2021   BUN 11 09/27/2021   NA 141 09/27/2021   K 4.3 09/27/2021   CL 105 09/27/2021   CO2 24 09/27/2021   Lab Results  Component Value Date   CHOL 102 02/24/2021  CHOL 140 02/15/2020   CHOL 174 01/04/2020   Lab Results  Component Value Date   HDL 36.30 (L) 02/24/2021   HDL 37.50 (L) 02/15/2020   HDL 38.10 (L) 01/04/2020   Lab Results  Component Value Date   LDLCALC 27 02/24/2021   LDLCALC 73 02/15/2020   LDLCALC 104 (H) 01/04/2020   Lab Results  Component Value Date   TRIG 194.0 (H) 02/24/2021   TRIG 148.0 02/15/2020   TRIG 159.0 (H) 01/04/2020   Lab Results  Component Value Date   CHOLHDL 3 02/24/2021   CHOLHDL 4 02/15/2020    CHOLHDL 5 01/04/2020   No results found for: "LDLDIRECT" The ASCVD Risk score (Arnett DK, et al., 2019) failed to calculate for the following reasons:   The valid total cholesterol range is 130 to 320 mg/dL I have reviewed the PMH, Fam and Soc history. Patient Active Problem List   Diagnosis Date Noted   Cirrhosis of liver ([place]) - h/o hep C 01/04/2020    Priority: High   Monoclonal gammopathy of unknown significance ([facility]) 01/23/2019    Priority: High   Controlled type 2 diabetes mellitus without complication, without long-term current use of insulin ([place]) 12/12/2018    Priority: High    Diagnosed 2020    Hyperlipidemia associated with type 2 diabetes mellitus ([place]) 12/12/2018    Priority: High    LDL goal of 70 given diabetes    Asthma with COPD ([place]) 05/25/2013    Priority: High   Class 2 severe obesity due to excess calories with serious comorbidity in adult, unspecified BMI ([place])     Priority: High   Major depression, recurrent, chronic ([place]) 03/07/2006    Priority: High    Sees psychiatry: Sharon Seller, [place]; [facility] Psychotherapy with Zella Ball, PsyD    Essential hypertension 03/07/2006    Priority: High   Chronic low back pain 03/07/2006    Priority: High   Insomnia 03/07/2006    Priority: High    On chronic Ambien (started by previous provider).     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  Followed by hep clinic.  Undetectable viral load - Cured.    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 Her smoking use included cigarettes. She has a 9.25 pack-year smoking history. She has never used smokeless tobacco. She reports that she does not drink alcohol and does not use drugs.  Review of Systems: Ophthalmic: negative for eye pain, loss of vision or double vision Cardiovascular: negative for  chest pain Respiratory: negative for SOB or persistent cough Gastrointestinal: negative for abdominal pain Genitourinary: negative for dysuria or gross hematuria MSK: negative for foot lesions Neurologic: negative for weakness or gait disturbance  Objective  Vitals: BP 104/60   Pulse (!) 105   Temp 98.3 F (36.8 C)   Ht '5\' 4"'$  (1.626 m)   Wt 236 lb 6.4 oz (107.2 kg)   SpO2 96%   BMI 40.58 kg/m  General: well appearing, no acute distress  Psych:  Alert and oriented, flat  mood and affect HEENT:  Normocephalic, atraumatic, moist mucous membranes, supple neck  Cardiovascular:  Nl S1 and S2, irregularly irregular respiratory:  Good breath sounds bilaterally, CTAB with normal effort, no rales   Diabetic education: ongoing education regarding chronic disease management for diabetes was given today. We continue to reinforce the ABC's of diabetic management: A1c (<7 or 8 dependent upon patient), tight blood pressure control, and cholesterol management with goal LDL < 100 minimally. We discuss diet strategies, exercise recommendations, medication options and possible side effects. At each visit, we review recommended immunizations and preventive care  recommendations for diabetics and stress that good diabetic control can prevent other problems. See below for this patient's data.   Commons side effects, risks, benefits, and alternatives for medications and treatment plan prescribed today were discussed, and the patient expressed understanding of the given instructions. Patient is instructed to call or message via MyChart if he/she has any questions or concerns regarding our treatment plan. No barriers to understanding were identified. We discussed Red Flag symptoms and signs in detail. Patient expressed understanding regarding what to do in case of urgent or emergency type symptoms.  Medication list was reconciled, printed and provided to the patient in AVS. Patient instructions and summary information was reviewed with the patient as documented in the AVS. This note was prepared with assistance of Dragon voice recognition software. Occasional wrong-word or sound-a-like substitutions may have occurred due to the inherent limitations of voice recognition software

## 2021-10-11 ENCOUNTER — Ambulatory Visit (HOSPITAL_COMMUNITY)
Admission: RE | Admit: 2021-10-11 | Discharge: 2021-10-11 | Disposition: A | Discharge status: Home / Self Care | Payer: Medicare HMO | Source: Ambulatory Visit | Attending: Physician Assistant | Admitting: Physician Assistant

## 2021-10-11 ENCOUNTER — Other Ambulatory Visit (HOSPITAL_COMMUNITY): Payer: Self-pay | Admitting: *Deleted

## 2021-10-11 ENCOUNTER — Encounter (HOSPITAL_COMMUNITY): Payer: Self-pay | Admitting: Physician Assistant

## 2021-10-11 VITALS — BP 128/84 | HR 128 | Ht 64.0 in | Wt 238.0 lb

## 2021-10-11 DIAGNOSIS — R0602 Shortness of breath: Secondary | ICD-10-CM | POA: Insufficient documentation

## 2021-10-11 DIAGNOSIS — I10 Essential (primary) hypertension: Secondary | ICD-10-CM | POA: Diagnosis not present

## 2021-10-11 DIAGNOSIS — Z79899 Other long term (current) drug therapy: Secondary | ICD-10-CM | POA: Insufficient documentation

## 2021-10-11 DIAGNOSIS — G4733 Obstructive sleep apnea (adult) (pediatric): Secondary | ICD-10-CM | POA: Insufficient documentation

## 2021-10-11 DIAGNOSIS — Z8619 Personal history of other infectious and parasitic diseases: Secondary | ICD-10-CM | POA: Diagnosis not present

## 2021-10-11 DIAGNOSIS — R5383 Other fatigue: Secondary | ICD-10-CM | POA: Insufficient documentation

## 2021-10-11 DIAGNOSIS — D6869 Other thrombophilia: Secondary | ICD-10-CM | POA: Insufficient documentation

## 2021-10-11 DIAGNOSIS — I4819 Other persistent atrial fibrillation: Secondary | ICD-10-CM | POA: Insufficient documentation

## 2021-10-11 DIAGNOSIS — E119 Type 2 diabetes mellitus without complications: Secondary | ICD-10-CM | POA: Diagnosis not present

## 2021-10-11 DIAGNOSIS — Z7901 Long term (current) use of anticoagulants: Secondary | ICD-10-CM | POA: Insufficient documentation

## 2021-10-11 DIAGNOSIS — E669 Obesity, unspecified: Secondary | ICD-10-CM | POA: Diagnosis not present

## 2021-10-11 DIAGNOSIS — Z6841 Body Mass Index (BMI) 40.0 and over, adult: Secondary | ICD-10-CM | POA: Diagnosis not present

## 2021-10-11 LAB — BRAIN NATRIURETIC PEPTIDE: B Natriuretic Peptide: 308.8 pg/mL — ABNORMAL HIGH (ref 0.0–100.0)

## 2021-10-11 LAB — BASIC METABOLIC PANEL
Anion gap: 9 (ref 5–15)
BUN: 6 mg/dL — ABNORMAL LOW (ref 8–23)
CO2: 23 mmol/L (ref 22–32)
Calcium: 8.9 mg/dL (ref 8.9–10.3)
Chloride: 107 mmol/L (ref 98–111)
Creatinine, Ser: 0.65 mg/dL (ref 0.44–1.00)
GFR, Estimated: >60 mL/min (ref >60)
Glucose, Bld: 120 mg/dL — ABNORMAL HIGH (ref 70–99)
Potassium: 3.2 mmol/L — ABNORMAL LOW (ref 3.5–5.1)
Sodium: 139 mmol/L (ref 135–145)

## 2021-10-11 LAB — CBC
HCT: 45.6 % (ref 36.0–46.0)
Hemoglobin: 14.3 g/dL (ref 12.0–15.0)
MCH: 24.5 pg — ABNORMAL LOW (ref 26.0–34.0)
MCHC: 31.4 g/dL (ref 30.0–36.0)
MCV: 78.2 fL — ABNORMAL LOW (ref 80.0–100.0)
Platelets: 186 10*3/uL (ref 150–400)
RBC: 5.83 MIL/uL — ABNORMAL HIGH (ref 3.87–5.11)
RDW: 14.4 % (ref 11.5–15.5)
WBC: 5.2 10*3/uL (ref 4.0–10.5)
nRBC: 0 % (ref 0.0–0.2)

## 2021-10-11 MED ORDER — FUROSEMIDE 20 MG PO TABS: ORAL_TABLET | ORAL | 0 refills | Status: DC

## 2021-10-11 MED ORDER — POTASSIUM CHLORIDE ER 10 MEQ PO TBCR: EXTENDED_RELEASE_TABLET | ORAL | 0 refills | Status: DC

## 2021-10-11 NOTE — Progress Notes (Addendum)
Primary Care Physician: [name], MD Primary Cardiologist: Dr Ellyn Hack (new) Primary Electrophysiologist: none Referring Physician: [facility]   Stacy Campbell is a 66 y.o. female with a history of hepatitis C s/p treatment, DM, HTN, atrial fibrillation who presents for follow up in the [facility].  The patient was initially diagnosed with atrial fibrillation remotely and reports having had an ablation at that time. She was seen by Dr Rexene Alberts for a sleep consult and her heart was irregular on auscultation. She was sent to her PCP for an ECG which showed afib with RVR and she was sent to the ED. Patient was started on Eliquis for a CHADS2VASC score of 4 and her amlodipine was changed to diltiazem for rate control. She was discharged in rate controlled afib. She presented for follow up with rapid rates, she had not picked up her diltiazem yet.   On follow up today, patient reports that she has noted more fatigue and SOB with exertion. She did start diltiazem and her heart rates have been better at home. She denies lower extremity edema, PND, or orthopnea.   Today, she denies symptoms of palpitations, chest pain, orthopnea, PND, lower extremity edema, dizziness, presyncope, syncope, bleeding, or neurologic sequela. The patient is tolerating medications without difficulties and is otherwise without complaint today.    Atrial Fibrillation Risk Factors:  she does have symptoms or diagnosis of sleep apnea. she is followed by Dr Rexene Alberts for sleep. she does not have a history of rheumatic fever. she does not have a history of alcohol use. The patient does not have a history of early familial atrial fibrillation or other arrhythmias.  she has a BMI of Body mass index is 40.85 kg/m.Marland Kitchen Filed Weights   10/11/21 0845  Weight: 108 kg    Family History  Problem Relation Age of Onset   Asthma Mother    Diabetes Mother    Asthma Father    Diabetes Father     Cancer Father        prostate   Eczema Father    Alcoholism Brother    Asthma Brother    Diabetes Brother    Asthma Brother    Diabetes Brother    Asthma Brother    Diabetes Brother    Asthma Brother    Diabetes Brother    Hepatitis B Daughter    Breast cancer Neg Hx      Atrial Fibrillation Management history:  Previous antiarrhythmic drugs: none Previous cardioversions: 1990's Previous ablations: none CHADS2VASC score: 4 Anticoagulation history: Eliquis   Past Medical History:  Diagnosis Date   Angio-edema    Anxiety    Arthritis    "lower back" (01/29/2017)   Asthma    Bone spur    spine   Chronic lower back pain    Cirrhosis of liver ([place]) 12/2018   Depression    Diabetes mellitus without complication (HCC)    Eczema    GERD (gastroesophageal reflux disease)    Hepatitis C    was treated for this   History of atrial fibrillation without current medication    cardioversion in 1990s   Hyperplastic polyp of intestine    Hypertension    Insomnia    Osteoarthritis    Osteoporosis    Splenic artery aneurysm ([place])    Urticaria    Past Surgical History:  Procedure Laterality Date   CARDIOVERSION  1990s   CHOLECYSTECTOMY N/A 01/22/2018   Procedure: LAPAROSCOPIC  CHOLECYSTECTOMY ERAS PATHWAY;  Surgeon: Clovis Riley, MD;  Location: [facility];  Service: General;  Laterality: N/A;   CYSTOSCOPY W/ URETERAL STENT PLACEMENT Left 01/29/2017   Procedure: CYSTOSCOPY WITH RETROGRADE PYELOGRAM/URETERAL STENT PLACEMENT;  Surgeon: Ardis Hughs, MD;  Location: [facility];  Service: Urology;  Laterality: Left;   CYSTOSCOPY WITH RETROGRADE PYELOGRAM, URETEROSCOPY AND STENT PLACEMENT Left 01/29/2017   CYSTOSCOPY WITH RETROGRADE PYELOGRAM/URETERAL STENT PLACEMENT    Current Outpatient Medications  Medication Sig Dispense Refill   albuterol (VENTOLIN HFA) 108 (90 Base) MCG/ACT inhaler INHALE 2 PUFFS INTO THE LUNGS EVERY 6 HOURS AS NEEDED FOR WHEEZING OR SHORTNESS OF BREATH  54 g 1   apixaban (ELIQUIS) 5 MG TABS tablet Take 1 tablet (5 mg total) by mouth 2 (two) times daily. 60 tablet 4   ARIPiprazole (ABILIFY) 5 MG tablet Take 1 tablet (5 mg total) by mouth daily. 90 tablet 0   ARNUITY ELLIPTA 100 MCG/ACT AEPB INHALE 1 PUFF INTO THE LUNGS DAILY 30 each 2   Aspirin-Salicylamide-Caffeine (BC HEADACHE PO) Take by mouth.     busPIRone (BUSPAR) 10 MG tablet Take 10 mg by mouth 3 (three) times daily.     busPIRone (BUSPAR) 5 MG tablet Take 5 mg by mouth 2 (two) times daily.     carvedilol (COREG) 25 MG tablet TAKE 1 TABLET(25 MG) BY MOUTH TWICE DAILY WITH A MEAL 180 tablet 3   cetirizine (ZYRTEC) 10 MG tablet Take by mouth.     desvenlafaxine (PRISTIQ) 50 MG 24 hr tablet Take 50 mg by mouth daily.     diltiazem (CARDIZEM CD) 360 MG 24 hr capsule Take 1 capsule (360 mg total) by mouth daily. 30 capsule 3   doxepin (SINEQUAN) 25 MG capsule Take 25 mg by mouth 3 (three) times daily as needed.     EPINEPHrine 0.3 mg/0.3 mL IJ SOAJ injection Use as directed for severe allergic reaction 2 Device 2   gabapentin (NEURONTIN) 300 MG capsule Take 1 capsule (300 mg total) by mouth at bedtime. 90 capsule 1   glucose blood test strip Test blood sugar daily and record on blood sugar log 100 each 12   JARDIANCE 10 MG TABS tablet TAKE 1 TABLET(10 MG) BY MOUTH DAILY 90 tablet 3   Lancets (ACCU-CHEK SOFT TOUCH) lancets Check blood sugar daily and record in log 100 each 12   lisinopril-hydrochlorothiazide (ZESTORETIC) 20-25 MG tablet TAKE 1 TABLET BY MOUTH DAILY 90 tablet 3   meclizine (ANTIVERT) 25 MG tablet Take 1 tablet (25 mg total) by mouth 3 (three) times daily as needed for dizziness. 30 tablet 0   metFORMIN (GLUCOPHAGE-XR) 500 MG 24 hr tablet TAKE 1 TABLET(500 MG) BY MOUTH DAILY WITH BREAKFAST 90 tablet 3   mycophenolate (CELLCEPT) 500 MG tablet Take 2 tablets by mouth 2 (two) times daily.     nystatin cream (MYCOSTATIN) APPLY TOPICALLY TO THE AFFECTED AREA TWICE DAILY FOR 7 DAYS  THEN AS NEEDED 30 g 0   olopatadine (PATANOL) 0.1 % ophthalmic solution Place 1 drop into both eyes 2 (two) times daily. 5 mL 5   oxyCODONE-acetaminophen (PERCOCET) 10-325 MG tablet Take 1 tablet by mouth 5 (five) times daily as needed.     prazosin (MINIPRESS) 2 MG capsule Take 2 mg by mouth at bedtime.     rosuvastatin (CRESTOR) 10 MG tablet TAKE 1 TABLET(10 MG) BY MOUTH DAILY 90 tablet 3   tacrolimus (PROTOPIC) 0.1 % ointment Apply 1 Application topically 2 (two) times daily.  tiZANidine (ZANAFLEX) 4 MG tablet Take 4 mg by mouth 2 (two) times daily.     triamcinolone ointment (KENALOG) 0.1 % Apply 1 application topically 2 (two) times daily.     TRULICITY 3.87 FI/4.3PI SOPN ADMINISTER 0.75 MG UNDER THE SKIN 1 TIME A WEEK 2 mL 5   zolpidem (AMBIEN CR) 12.5 MG CR tablet Take 12.5 mg by mouth at bedtime.     No current facility-administered medications for this encounter.    Allergies  Allergen Reactions   Hydrocodone Itching   Linalool    Methylisothiazolinone Other (See Comments)    Other reaction(s): Other (See Comments) Positive patch test Positive patch test    Propylene Glycol    Aspirin Itching and Other (See Comments)    Lower dose doesn't make her itch (she takes it every day)   Eggs Or Egg-Derived Products Nausea Only and Other (See Comments)    No reaction if in another food   Hydrocodone-Guaifenesin Rash    Social History   Socioeconomic History   Marital status: Widowed    Spouse name: Not on file   Number of children: Not on file   Years of education: Not on file   Highest education level: Not on file  Occupational History   Not on file  Tobacco Use   Smoking status: Former    Packs/day: 0.25    Years: 37.00    Total pack years: 9.25    Types: Cigarettes    Quit date: 03/30/2008    Years since quitting: 13.5   Smokeless tobacco: Never   Tobacco comments:    Former smoker 10/04/21  Vaping Use   Vaping Use: Never used  Substance and Sexual Activity    Alcohol use: No   Drug use: Never   Sexual activity: Not Currently    Birth control/protection: Post-menopausal  Other Topics Concern   Not on file  Social History Narrative   Caffiene none. Soda 2 daily.   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   Children 6, grandkids 18.       Social Determinants of Health   Financial Resource Strain: Low Risk  (07/28/2021)   Overall Financial Resource Strain (CARDIA)    Difficulty of Paying Living Expenses: Not hard at all  Food Insecurity: No Food Insecurity (07/28/2021)   Hunger Vital Sign    Worried About Running Out of Food in the Last Year: Never true    Ran Out of Food in the Last Year: Never true  Transportation Needs: No Transportation Needs (07/28/2021)   PRAPARE - Hydrologist (Medical): No    Lack of Transportation (Non-Medical): No  Physical Activity: Insufficiently Active (07/28/2021)   Exercise Vital Sign    Days of Exercise per Week: 2 days    Minutes of Exercise per Session: 50 min  Stress: Stress Concern Present (07/28/2021)   [place]    Feeling of Stress : To some extent  Social Connections: Moderately Isolated (07/28/2021)   Social Connection and Isolation Panel [NHANES]    Frequency of Communication with Friends and Family: More than three times a week    Frequency of Social Gatherings with Friends and Family: Twice a week    Attends Religious Services: More than 4 times per year    Active Member of [employer] or Organizations: No    Attends Archivist Meetings: Never    Marital Status: Widowed  Intimate Partner Violence: Not At  Risk (07/28/2021)   Humiliation, Afraid, Rape, and Kick questionnaire    Fear of Current or Ex-Partner: No    Emotionally Abused: No    Physically Abused: No    Sexually Abused: No     ROS- All systems are reviewed and negative except as per the HPI above.  Physical Exam: Vitals:    10/11/21 0845  BP: 128/84  Pulse: (!) 128  Weight: 108 kg  Height: '5\' 4"'$  (1.626 m)    GEN- The patient is a well appearing female, alert and oriented x 3 today.   HEENT-head normocephalic, atraumatic, sclera clear, conjunctiva pink, hearing intact, trachea midline. Lungs- Clear to ausculation bilaterally, normal work of breathing Heart- irregular rate and rhythm, no murmurs, rubs or gallops  GI- soft, NT, ND, + BS Extremities- no clubbing, cyanosis, or edema MS- no significant deformity or atrophy Skin- no rash or lesion Psych- euthymic mood, full affect Neuro- strength and sensation are intact   Wt Readings from Last 3 Encounters:  10/11/21 108 kg  10/06/21 107.2 kg  10/04/21 105.5 kg    EKG today demonstrates  Afib with RVR Vent. rate 128 BPM PR interval * ms QRS duration 80 ms QT/QTcB 272/397 ms  Echo 01/30/17 demonstrated  Left ventricle: The cavity size was normal. There was mild focal    basal hypertrophy of the septum. Systolic function was vigorous.    The estimated ejection fraction was in the range of 65% to 70%    with acceleration of flow through outflow tract (peak velocity    2.34ms). Wall motion was normal; there were no regional wall    motion abnormalities. Features are consistent with a pseudonormal    left ventricular filling pattern, with concomitant abnormal    relaxation and increased filling pressure (grade 2 diastolic    dysfunction).  - Aortic valve: Trileaflet; mildly thickened, mildly calcified    leaflets. Transvalvular velocity was within the normal range.    There was no stenosis.  - Mitral valve: Calcified annulus. Mildly thickened leaflets .    There was trivial regurgitation.  - Pulmonary arteries: PA peak pressure: 55 mm Hg (S).  Epic records are reviewed at length today  CHA2DS2-VASc Score = 4  The patient's score is based upon: CHF History: 0 HTN History: 1 Diabetes History: 1 Stroke History: 0 Vascular Disease History:  0 Age Score: 1 Gender Score: 1       ASSESSMENT AND PLAN: 1. Persistent Atrial Fibrillation (ICD10:  I48.19) The patient's CHA2DS2-VASc score is 4, indicating a 4.8% annual risk of stroke.   Patient remains in symptomatic afib. Heart rates improved during visit to 100-110s at rest.  DCCV scheduled for 10/18/21 Continue Eliquis 5 mg BID Continue diltiazem 360 mg daily Continue carvedilol 25 mg BID Will check bmet/cbc/BNP today  2. Secondary Hypercoagulable State (ICD10:  D68.69) The patient is at significant risk for stroke/thromboembolism based upon her CHA2DS2-VASc Score of 4.  Continue Apixaban (Eliquis).   3. Obesity Body mass index is 40.85 kg/m. Lifestyle modification was discussed and encouraged including regular physical activity and weight reduction.  4. Suspected obstructive sleep apnea The importance of adequate treatment of sleep apnea was discussed today in order to improve our ability to maintain sinus rhythm long term. Seen by Dr ARexene Albertswith plan for sleep study.  5. HTN Stable, no changes today.   Follow up with Dr HEllyn Hackpost DCCV as scheduled.    [facility]  [place], [place] [address] [phone] 10/11/2021 9:08 AM

## 2021-10-11 NOTE — Patient Instructions (Signed)
Cardioversion scheduled for Wednesday, October 11th             - Arrive at the [facility] and go to admitting at 830am             - Do not eat or drink anything after midnight the night prior to your procedure.             - Take all your morning medication (except diabetic medications) with a sip of water prior to arrival.             - You will not be able to drive home after your procedure.             - Do NOT miss any doses of your blood thinner - if you should miss a dose please notify our office immediately.             - If you feel as if you go back into normal rhythm prior to scheduled cardioversion, please notify our office immediately. If your procedure is canceled in the cardioversion suite you will be charged a cancellation fee.

## 2021-10-11 NOTE — H&P (View-Only) (Signed)
Primary Care Physician: [name], MD Primary Cardiologist: Dr Ellyn Hack (new) Primary Electrophysiologist: none Referring Physician: [facility]   Stacy Campbell is a 66 y.o. female with a history of hepatitis C s/p treatment, DM, HTN, atrial fibrillation who presents for follow up in the [facility].  The patient was initially diagnosed with atrial fibrillation remotely and reports having had an ablation at that time. She was seen by Dr Rexene Alberts for a sleep consult and her heart was irregular on auscultation. She was sent to her PCP for an ECG which showed afib with RVR and she was sent to the ED. Patient was started on Eliquis for a CHADS2VASC score of 4 and her amlodipine was changed to diltiazem for rate control. She was discharged in rate controlled afib. She presented for follow up with rapid rates, she had not picked up her diltiazem yet.   On follow up today, patient reports that she has noted more fatigue and SOB with exertion. She did start diltiazem and her heart rates have been better at home. She denies lower extremity edema, PND, or orthopnea.   Today, she denies symptoms of palpitations, chest pain, orthopnea, PND, lower extremity edema, dizziness, presyncope, syncope, bleeding, or neurologic sequela. The patient is tolerating medications without difficulties and is otherwise without complaint today.    Atrial Fibrillation Risk Factors:  she does have symptoms or diagnosis of sleep apnea. she is followed by Dr Rexene Alberts for sleep. she does not have a history of rheumatic fever. she does not have a history of alcohol use. The patient does not have a history of early familial atrial fibrillation or other arrhythmias.  she has a BMI of Body mass index is 40.85 kg/m.Marland Kitchen Filed Weights   10/11/21 0845  Weight: 108 kg    Family History  Problem Relation Age of Onset   Asthma Mother    Diabetes Mother    Asthma Father    Diabetes Father     Cancer Father        prostate   Eczema Father    Alcoholism Brother    Asthma Brother    Diabetes Brother    Asthma Brother    Diabetes Brother    Asthma Brother    Diabetes Brother    Asthma Brother    Diabetes Brother    Hepatitis B Daughter    Breast cancer Neg Hx      Atrial Fibrillation Management history:  Previous antiarrhythmic drugs: none Previous cardioversions: 1990's Previous ablations: none CHADS2VASC score: 4 Anticoagulation history: Eliquis   Past Medical History:  Diagnosis Date   Angio-edema    Anxiety    Arthritis    "lower back" (01/29/2017)   Asthma    Bone spur    spine   Chronic lower back pain    Cirrhosis of liver ([place]) 12/2018   Depression    Diabetes mellitus without complication (HCC)    Eczema    GERD (gastroesophageal reflux disease)    Hepatitis C    was treated for this   History of atrial fibrillation without current medication    cardioversion in 1990s   Hyperplastic polyp of intestine    Hypertension    Insomnia    Osteoarthritis    Osteoporosis    Splenic artery aneurysm ([place])    Urticaria    Past Surgical History:  Procedure Laterality Date   CARDIOVERSION  1990s   CHOLECYSTECTOMY N/A 01/22/2018   Procedure: LAPAROSCOPIC  CHOLECYSTECTOMY ERAS PATHWAY;  Surgeon: Clovis Riley, MD;  Location: [facility];  Service: General;  Laterality: N/A;   CYSTOSCOPY W/ URETERAL STENT PLACEMENT Left 01/29/2017   Procedure: CYSTOSCOPY WITH RETROGRADE PYELOGRAM/URETERAL STENT PLACEMENT;  Surgeon: Ardis Hughs, MD;  Location: [place];  Service: Urology;  Laterality: Left;   CYSTOSCOPY WITH RETROGRADE PYELOGRAM, URETEROSCOPY AND STENT PLACEMENT Left 01/29/2017   CYSTOSCOPY WITH RETROGRADE PYELOGRAM/URETERAL STENT PLACEMENT    Current Outpatient Medications  Medication Sig Dispense Refill   albuterol (VENTOLIN HFA) 108 (90 Base) MCG/ACT inhaler INHALE 2 PUFFS INTO THE LUNGS EVERY 6 HOURS AS NEEDED FOR WHEEZING OR SHORTNESS OF BREATH  54 g 1   apixaban (ELIQUIS) 5 MG TABS tablet Take 1 tablet (5 mg total) by mouth 2 (two) times daily. 60 tablet 4   ARIPiprazole (ABILIFY) 5 MG tablet Take 1 tablet (5 mg total) by mouth daily. 90 tablet 0   ARNUITY ELLIPTA 100 MCG/ACT AEPB INHALE 1 PUFF INTO THE LUNGS DAILY 30 each 2   Aspirin-Salicylamide-Caffeine (BC HEADACHE PO) Take by mouth.     busPIRone (BUSPAR) 10 MG tablet Take 10 mg by mouth 3 (three) times daily.     busPIRone (BUSPAR) 5 MG tablet Take 5 mg by mouth 2 (two) times daily.     carvedilol (COREG) 25 MG tablet TAKE 1 TABLET(25 MG) BY MOUTH TWICE DAILY WITH A MEAL 180 tablet 3   cetirizine (ZYRTEC) 10 MG tablet Take by mouth.     desvenlafaxine (PRISTIQ) 50 MG 24 hr tablet Take 50 mg by mouth daily.     diltiazem (CARDIZEM CD) 360 MG 24 hr capsule Take 1 capsule (360 mg total) by mouth daily. 30 capsule 3   doxepin (SINEQUAN) 25 MG capsule Take 25 mg by mouth 3 (three) times daily as needed.     EPINEPHrine 0.3 mg/0.3 mL IJ SOAJ injection Use as directed for severe allergic reaction 2 Device 2   gabapentin (NEURONTIN) 300 MG capsule Take 1 capsule (300 mg total) by mouth at bedtime. 90 capsule 1   glucose blood test strip Test blood sugar daily and record on blood sugar log 100 each 12   JARDIANCE 10 MG TABS tablet TAKE 1 TABLET(10 MG) BY MOUTH DAILY 90 tablet 3   Lancets (ACCU-CHEK SOFT TOUCH) lancets Check blood sugar daily and record in log 100 each 12   lisinopril-hydrochlorothiazide (ZESTORETIC) 20-25 MG tablet TAKE 1 TABLET BY MOUTH DAILY 90 tablet 3   meclizine (ANTIVERT) 25 MG tablet Take 1 tablet (25 mg total) by mouth 3 (three) times daily as needed for dizziness. 30 tablet 0   metFORMIN (GLUCOPHAGE-XR) 500 MG 24 hr tablet TAKE 1 TABLET(500 MG) BY MOUTH DAILY WITH BREAKFAST 90 tablet 3   mycophenolate (CELLCEPT) 500 MG tablet Take 2 tablets by mouth 2 (two) times daily.     nystatin cream (MYCOSTATIN) APPLY TOPICALLY TO THE AFFECTED AREA TWICE DAILY FOR 7 DAYS  THEN AS NEEDED 30 g 0   olopatadine (PATANOL) 0.1 % ophthalmic solution Place 1 drop into both eyes 2 (two) times daily. 5 mL 5   oxyCODONE-acetaminophen (PERCOCET) 10-325 MG tablet Take 1 tablet by mouth 5 (five) times daily as needed.     prazosin (MINIPRESS) 2 MG capsule Take 2 mg by mouth at bedtime.     rosuvastatin (CRESTOR) 10 MG tablet TAKE 1 TABLET(10 MG) BY MOUTH DAILY 90 tablet 3   tacrolimus (PROTOPIC) 0.1 % ointment Apply 1 Application topically 2 (two) times daily.  tiZANidine (ZANAFLEX) 4 MG tablet Take 4 mg by mouth 2 (two) times daily.     triamcinolone ointment (KENALOG) 0.1 % Apply 1 application topically 2 (two) times daily.     TRULICITY 2.22 LN/9.8XQ SOPN ADMINISTER 0.75 MG UNDER THE SKIN 1 TIME A WEEK 2 mL 5   zolpidem (AMBIEN CR) 12.5 MG CR tablet Take 12.5 mg by mouth at bedtime.     No current facility-administered medications for this encounter.    Allergies  Allergen Reactions   Hydrocodone Itching   Linalool    Methylisothiazolinone Other (See Comments)    Other reaction(s): Other (See Comments) Positive patch test Positive patch test    Propylene Glycol    Aspirin Itching and Other (See Comments)    Lower dose doesn't make her itch (she takes it every day)   Eggs Or Egg-Derived Products Nausea Only and Other (See Comments)    No reaction if in another food   Hydrocodone-Guaifenesin Rash    Social History   Socioeconomic History   Marital status: Widowed    Spouse name: Not on file   Number of children: Not on file   Years of education: Not on file   Highest education level: Not on file  Occupational History   Not on file  Tobacco Use   Smoking status: Former    Packs/day: 0.25    Years: 37.00    Total pack years: 9.25    Types: Cigarettes    Quit date: 03/30/2008    Years since quitting: 13.5   Smokeless tobacco: Never   Tobacco comments:    Former smoker 10/04/21  Vaping Use   Vaping Use: Never used  Substance and Sexual Activity    Alcohol use: No   Drug use: Never   Sexual activity: Not Currently    Birth control/protection: Post-menopausal  Other Topics Concern   Not on file  Social History Narrative   Caffiene none. Soda 2 daily.   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   Children 6, grandkids 18.       Social Determinants of Health   Financial Resource Strain: Low Risk  (07/28/2021)   Overall Financial Resource Strain (CARDIA)    Difficulty of Paying Living Expenses: Not hard at all  Food Insecurity: No Food Insecurity (07/28/2021)   Hunger Vital Sign    Worried About Running Out of Food in the Last Year: Never true    Ran Out of Food in the Last Year: Never true  Transportation Needs: No Transportation Needs (07/28/2021)   PRAPARE - Hydrologist (Medical): No    Lack of Transportation (Non-Medical): No  Physical Activity: Insufficiently Active (07/28/2021)   Exercise Vital Sign    Days of Exercise per Week: 2 days    Minutes of Exercise per Session: 50 min  Stress: Stress Concern Present (07/28/2021)   [place]    Feeling of Stress : To some extent  Social Connections: Moderately Isolated (07/28/2021)   Social Connection and Isolation Panel [NHANES]    Frequency of Communication with Friends and Family: More than three times a week    Frequency of Social Gatherings with Friends and Family: Twice a week    Attends Religious Services: More than 4 times per year    Active Member of [employer] or Organizations: No    Attends Archivist Meetings: Never    Marital Status: Widowed  Intimate Partner Violence: Not At  Risk (07/28/2021)   Humiliation, Afraid, Rape, and Kick questionnaire    Fear of Current or Ex-Partner: No    Emotionally Abused: No    Physically Abused: No    Sexually Abused: No     ROS- All systems are reviewed and negative except as per the HPI above.  Physical Exam: Vitals:    10/11/21 0845  BP: 128/84  Pulse: (!) 128  Weight: 108 kg  Height: '5\' 4"'$  (1.626 m)    GEN- The patient is a well appearing female, alert and oriented x 3 today.   HEENT-head normocephalic, atraumatic, sclera clear, conjunctiva pink, hearing intact, trachea midline. Lungs- Clear to ausculation bilaterally, normal work of breathing Heart- irregular rate and rhythm, no murmurs, rubs or gallops  GI- soft, NT, ND, + BS Extremities- no clubbing, cyanosis, or edema MS- no significant deformity or atrophy Skin- no rash or lesion Psych- euthymic mood, full affect Neuro- strength and sensation are intact   Wt Readings from Last 3 Encounters:  10/11/21 108 kg  10/06/21 107.2 kg  10/04/21 105.5 kg    EKG today demonstrates  Afib with RVR Vent. rate 128 BPM PR interval * ms QRS duration 80 ms QT/QTcB 272/397 ms  Echo 01/30/17 demonstrated  Left ventricle: The cavity size was normal. There was mild focal    basal hypertrophy of the septum. Systolic function was vigorous.    The estimated ejection fraction was in the range of 65% to 70%    with acceleration of flow through outflow tract (peak velocity    2.83ms). Wall motion was normal; there were no regional wall    motion abnormalities. Features are consistent with a pseudonormal    left ventricular filling pattern, with concomitant abnormal    relaxation and increased filling pressure (grade 2 diastolic    dysfunction).  - Aortic valve: Trileaflet; mildly thickened, mildly calcified    leaflets. Transvalvular velocity was within the normal range.    There was no stenosis.  - Mitral valve: Calcified annulus. Mildly thickened leaflets .    There was trivial regurgitation.  - Pulmonary arteries: PA peak pressure: 55 mm Hg (S).  Epic records are reviewed at length today  CHA2DS2-VASc Score = 4  The patient's score is based upon: CHF History: 0 HTN History: 1 Diabetes History: 1 Stroke History: 0 Vascular Disease History:  0 Age Score: 1 Gender Score: 1       ASSESSMENT AND PLAN: 1. Persistent Atrial Fibrillation (ICD10:  I48.19) The patient's CHA2DS2-VASc score is 4, indicating a 4.8% annual risk of stroke.   Patient remains in symptomatic afib. Heart rates improved during visit to 100-110s at rest.  DCCV scheduled for 10/18/21 Continue Eliquis 5 mg BID Continue diltiazem 360 mg daily Continue carvedilol 25 mg BID Will check bmet/cbc/BNP today  2. Secondary Hypercoagulable State (ICD10:  D68.69) The patient is at significant risk for stroke/thromboembolism based upon her CHA2DS2-VASc Score of 4.  Continue Apixaban (Eliquis).   3. Obesity Body mass index is 40.85 kg/m. Lifestyle modification was discussed and encouraged including regular physical activity and weight reduction.  4. Suspected obstructive sleep apnea The importance of adequate treatment of sleep apnea was discussed today in order to improve our ability to maintain sinus rhythm long term. Seen by Dr ARexene Albertswith plan for sleep study.  5. HTN Stable, no changes today.   Follow up with Dr HEllyn Hackpost DCCV as scheduled.    [facility]  [place], [place] [address] [phone] 10/11/2021 9:08 AM

## 2021-10-18 ENCOUNTER — Ambulatory Visit (HOSPITAL_COMMUNITY)
Admission: RE | Admit: 2021-10-18 | Discharge: 2021-10-18 | Disposition: A | Discharge status: Home / Self Care | Payer: Medicare HMO | Attending: Cardiology | Admitting: Cardiology

## 2021-10-18 ENCOUNTER — Encounter (HOSPITAL_COMMUNITY)
Admission: RE | Disposition: A | Discharge status: Home / Self Care | Payer: Self-pay | Source: Home / Self Care | Attending: Cardiology

## 2021-10-18 ENCOUNTER — Ambulatory Visit (HOSPITAL_COMMUNITY): Payer: Medicare HMO | Admitting: Certified Registered"

## 2021-10-18 ENCOUNTER — Other Ambulatory Visit: Payer: Self-pay

## 2021-10-18 ENCOUNTER — Encounter (HOSPITAL_COMMUNITY): Payer: Self-pay | Admitting: Cardiology

## 2021-10-18 ENCOUNTER — Ambulatory Visit (HOSPITAL_BASED_OUTPATIENT_CLINIC_OR_DEPARTMENT_OTHER): Payer: Medicare HMO | Admitting: Certified Registered"

## 2021-10-18 DIAGNOSIS — I10 Essential (primary) hypertension: Secondary | ICD-10-CM | POA: Insufficient documentation

## 2021-10-18 DIAGNOSIS — Z87891 Personal history of nicotine dependence: Secondary | ICD-10-CM | POA: Diagnosis not present

## 2021-10-18 DIAGNOSIS — D119 Benign neoplasm of major salivary gland, unspecified: Secondary | ICD-10-CM | POA: Diagnosis not present

## 2021-10-18 DIAGNOSIS — Z7984 Long term (current) use of oral hypoglycemic drugs: Secondary | ICD-10-CM

## 2021-10-18 DIAGNOSIS — Z79899 Other long term (current) drug therapy: Secondary | ICD-10-CM | POA: Diagnosis not present

## 2021-10-18 DIAGNOSIS — Z7901 Long term (current) use of anticoagulants: Secondary | ICD-10-CM | POA: Insufficient documentation

## 2021-10-18 DIAGNOSIS — E119 Type 2 diabetes mellitus without complications: Secondary | ICD-10-CM | POA: Diagnosis not present

## 2021-10-18 DIAGNOSIS — E669 Obesity, unspecified: Secondary | ICD-10-CM | POA: Diagnosis not present

## 2021-10-18 DIAGNOSIS — D6869 Other thrombophilia: Secondary | ICD-10-CM | POA: Diagnosis not present

## 2021-10-18 DIAGNOSIS — I4819 Other persistent atrial fibrillation: Secondary | ICD-10-CM | POA: Diagnosis present

## 2021-10-18 DIAGNOSIS — I4891 Unspecified atrial fibrillation: Secondary | ICD-10-CM | POA: Diagnosis not present

## 2021-10-18 DIAGNOSIS — Z6841 Body Mass Index (BMI) 40.0 and over, adult: Secondary | ICD-10-CM | POA: Insufficient documentation

## 2021-10-18 DIAGNOSIS — F418 Other specified anxiety disorders: Secondary | ICD-10-CM | POA: Insufficient documentation

## 2021-10-18 HISTORY — PX: CARDIOVERSION: SHX1299

## 2021-10-18 LAB — GLUCOSE, CAPILLARY: Glucose-Capillary: 140 mg/dL — ABNORMAL HIGH (ref 70–99)

## 2021-10-18 SURGERY — CARDIOVERSION
Anesthesia: General

## 2021-10-18 MED ORDER — PROPOFOL 10 MG/ML IV BOLUS
INTRAVENOUS | Status: DC | PRN
  Administered 2021-10-18: 50 mg via INTRAVENOUS
  Administered 2021-10-18: 30 mg via INTRAVENOUS

## 2021-10-18 MED ORDER — SODIUM CHLORIDE 0.9 % IV SOLN: INTRAVENOUS | Status: DC

## 2021-10-18 MED ORDER — LIDOCAINE 2% (20 MG/ML) 5 ML SYRINGE
INTRAMUSCULAR | Status: DC | PRN
  Administered 2021-10-18: 60 mg via INTRAVENOUS

## 2021-10-18 NOTE — Discharge Instructions (Signed)

## 2021-10-18 NOTE — Transfer of Care (Signed)
Immediate Anesthesia Transfer of Care Note  Patient: Stacy Campbell  Procedure(s) Performed: CARDIOVERSION  Patient Location: Endoscopy Unit  Anesthesia Type:General  Level of Consciousness: drowsy and patient cooperative  Airway & Oxygen Therapy: Patient Spontanous Breathing  Post-op Assessment: Report given to RN  Post vital signs: Reviewed and stable  Last Vitals:  Vitals Value Taken Time  BP 102/67   Temp    Pulse 83   Resp 19   SpO2 96     Last Pain:  Vitals:   10/18/21 0827  PainSc: 0-No pain         Complications: No notable events documented.

## 2021-10-18 NOTE — CV Procedure (Signed)
    Electrical Cardioversion Procedure Note Stacy Campbell [id number] May 17, 1955  Procedure: Electrical Cardioversion Indications:  Atrial Fibrillation  Time Out: Verified patient identification, verified procedure,medications/allergies/relevent history reviewed, required imaging and test results available.  Performed  Procedure Details  The patient was NPO after midnight. Anesthesia was administered at the beside  by Dr.Fitzgerald with  propofol.  Cardioversion was performed with synchronized biphasic defibrillation via AP pads with 200 joules.  1 attempt(s) were performed.  The patient converted to normal sinus rhythm. The patient tolerated the procedure well   IMPRESSION:  Successful cardioversion of atrial fibrillation    Stacy Campbell 10/18/2021, 9:48 AM

## 2021-10-18 NOTE — Anesthesia Preprocedure Evaluation (Signed)
Anesthesia Evaluation  Patient identified by MRN, date of birth, ID band Patient awake    Reviewed: Allergy & Precautions, H&P , NPO status , Patient's Chart, lab work & pertinent test results, reviewed documented beta blocker date and time   Airway Mallampati: II  TM Distance: >3 FB Neck ROM: Full    Dental no notable dental hx. (+) Edentulous Upper, Edentulous Lower, Dental Advisory Given   Pulmonary asthma , COPD,  COPD inhaler, former smoker   Pulmonary exam normal breath sounds clear to auscultation       Cardiovascular hypertension, Pt. on medications and Pt. on home beta blockers + dysrhythmias Atrial Fibrillation  Rhythm:Irregular Rate:Normal     Neuro/Psych   Anxiety Depression    negative neurological ROS     GI/Hepatic ,GERD  Medicated,,(+) Hepatitis -, C  Endo/Other  diabetes, Type 2, Oral Hypoglycemic Agents    Renal/GU negative Renal ROS  negative genitourinary   Musculoskeletal  (+) Arthritis , Osteoarthritis,    Abdominal   Peds  Hematology negative hematology ROS (+)   Anesthesia Other Findings   Reproductive/Obstetrics negative OB ROS                             Anesthesia Physical Anesthesia Plan  ASA: 3  Anesthesia Plan: General   Post-op Pain Management: Minimal or no pain anticipated   Induction: Intravenous  PONV Risk Score and Plan: 3 and Propofol infusion and Treatment may vary due to age or medical condition  Airway Management Planned: Mask  Additional Equipment:   Intra-op Plan:   Post-operative Plan:   Informed Consent: I have reviewed the patients History and Physical, chart, labs and discussed the procedure including the risks, benefits and alternatives for the proposed anesthesia with the patient or authorized representative who has indicated his/her understanding and acceptance.     Dental advisory given  Plan Discussed with:  CRNA  Anesthesia Plan Comments:        Anesthesia Quick Evaluation

## 2021-10-18 NOTE — Anesthesia Postprocedure Evaluation (Signed)
Anesthesia Post Note  Patient: Stacy Campbell  Procedure(s) Performed: CARDIOVERSION     Patient location during evaluation: Endoscopy Anesthesia Type: General Level of consciousness: awake and alert Pain management: pain level controlled Vital Signs Assessment: post-procedure vital signs reviewed and stable Respiratory status: spontaneous breathing, nonlabored ventilation and respiratory function stable Cardiovascular status: blood pressure returned to baseline and stable Postop Assessment: no apparent nausea or vomiting Anesthetic complications: no  No notable events documented.  Last Vitals:  Vitals:   10/18/21 0950 10/18/21 0951  BP:  (!) 112/57  Pulse: 68 74  Resp: 12 19  Temp:    SpO2: 95% 97%    Last Pain:  Vitals:   10/18/21 0951  TempSrc:   PainSc: 0-No pain                 Keri Veale,W. EDMOND

## 2021-10-18 NOTE — Interval H&P Note (Signed)
History and Physical Interval Note:  10/18/2021 9:09 AM  Anu D Turpin  has presented today for surgery, with the diagnosis of AFIB.  The various methods of treatment have been discussed with the patient and family. After consideration of risks, benefits and other options for treatment, the patient has consented to  Procedure(s): CARDIOVERSION (N/A) as a surgical intervention.  The patient's history has been reviewed, patient examined, no change in status, stable for surgery.  I have reviewed the patient's chart and labs.  Questions were answered to the patient's satisfaction.     [employer]

## 2021-10-21 ENCOUNTER — Encounter (HOSPITAL_COMMUNITY): Payer: Self-pay | Admitting: Cardiology

## 2021-10-25 ENCOUNTER — Other Ambulatory Visit: Payer: Self-pay | Admitting: Family Medicine

## 2021-10-30 ENCOUNTER — Encounter: Payer: Self-pay | Admitting: Cardiology

## 2021-10-30 ENCOUNTER — Telehealth (HOSPITAL_COMMUNITY): Payer: Self-pay | Admitting: *Deleted

## 2021-10-30 ENCOUNTER — Ambulatory Visit: Payer: Medicare HMO | Attending: Cardiology | Admitting: Cardiology

## 2021-10-30 VITALS — BP 108/60 | HR 67 | Ht 64.0 in | Wt 230.6 lb

## 2021-10-30 DIAGNOSIS — E119 Type 2 diabetes mellitus without complications: Secondary | ICD-10-CM

## 2021-10-30 DIAGNOSIS — I4891 Unspecified atrial fibrillation: Secondary | ICD-10-CM | POA: Diagnosis not present

## 2021-10-30 DIAGNOSIS — I4819 Other persistent atrial fibrillation: Secondary | ICD-10-CM | POA: Diagnosis not present

## 2021-10-30 DIAGNOSIS — E785 Hyperlipidemia, unspecified: Secondary | ICD-10-CM

## 2021-10-30 DIAGNOSIS — I10 Essential (primary) hypertension: Secondary | ICD-10-CM | POA: Diagnosis not present

## 2021-10-30 DIAGNOSIS — D6869 Other thrombophilia: Secondary | ICD-10-CM

## 2021-10-30 DIAGNOSIS — E1169 Type 2 diabetes mellitus with other specified complication: Secondary | ICD-10-CM | POA: Diagnosis not present

## 2021-10-30 DIAGNOSIS — R0789 Other chest pain: Secondary | ICD-10-CM | POA: Insufficient documentation

## 2021-10-30 DIAGNOSIS — J4489 Other specified chronic obstructive pulmonary disease: Secondary | ICD-10-CM

## 2021-10-30 MED ORDER — DILTIAZEM HCL ER COATED BEADS 360 MG PO CP24: 360.0000 mg | ORAL_CAPSULE | Freq: Every day | ORAL | 3 refills | Status: DC

## 2021-10-30 MED ORDER — APIXABAN 5 MG PO TABS: 5.0000 mg | ORAL_TABLET | Freq: Two times a day (BID) | ORAL | 1 refills | Status: DC

## 2021-10-30 MED ORDER — APIXABAN 5 MG PO TABS: 5.0000 mg | ORAL_TABLET | Freq: Two times a day (BID) | ORAL | 0 refills | Status: DC

## 2021-10-30 NOTE — Telephone Encounter (Signed)
Patient given detailed instructions per Myocardial Perfusion Study Information Sheet for the test on 11/06/2021 at 8:15. Patient notified to arrive 15 minutes early and that it is imperative to arrive on time for appointment to keep from having the test rescheduled.  If you need to cancel or reschedule your appointment, please call the office within 24 hours of your appointment. . Patient verbalized understanding.Stacy Campbell

## 2021-10-30 NOTE — Patient Instructions (Signed)
Medication Instructions:  Not needed  *If you need a refill on your cardiac medications before your next appointment, please call your pharmacy*   Lab Work: Not needed     Testing/Procedures: Will be schedule at [facility] has requested that you have a lexiscan myoview. Please follow instruction sheet, as given.   Follow-Up: At [facility], you and your health needs are our priority.  As part of our continuing mission to provide you with exceptional heart care, we have created designated Provider Care Teams.  These Care Teams include your primary Cardiologist (physician) and Advanced Practice Providers (APPs -  Physician Assistants and Nurse Practitioners) who all work together to provide you with the care you need, when you need it.     Your next appointment:   1 to 3 month(s)  The format for your next appointment:   In Person  Provider:   Glenetta Hew, MD    Other Instructions     Your doctor has scheduled you for a Myocardial Perfusion scan to obtain information about the blood flow to your heart. The test consists of taking pictures of your heart in two phases: while resting and after a stress test.  The stress test, you will be given a drug intended to have a similar effect  on the heart to that of exercise.  The test will take approximately 3 to 4  hours to complete.  If you are pregnant or breastfeeding,  please notify the staff prior to your test.  How to prepare for your test: Do not eat or drink 2 hours prior to your test Do not consume products containing caffeine 12 hours prior to your test (examples: coffee (regular OR decaf), chocolate, sodas, tea) Your doctor may need you to hold certain medications prior to the test.  If so, these are listed below and should not be taken for 24 hours prior to the test.  If not listed below, you may take your medications as normal.  You may resume taking held medications on your normal  schedule once the test is complete.   Meds to hold: none Do bring a list of your current medications with you.  If you have held any meds in preparation for the test, please bring them, as you may be required to take them once the test is completed. Do wear comfortable clothes and walking shoes.  Do not wear dresses or overalls. Do NOT wear cologne, perfume, aftershave, or fragranced lotions the day of your test (deodorants okay). If these instructions are not followed your test will have to be rescheduled.   A nuclear cardiologist will review your test, prepare a report and send it to your physician.   If you have questions or concerns about your appointment, you can call the Nuclear Cardiology department at [phone]. If you cannot keep your appointment, please provide 48 hours notification to avoid a possible $50.00 charge to your account.   Please arrive 15 minutes prior to your appointment time for registration and insurance purposes

## 2021-10-30 NOTE — Progress Notes (Signed)
Primary Care Provider: [name], [place] Cardiologist: Glenetta Hew, MD -> Previously seen by Dr. Geraldo Pitter back in 2019.  Electrophysiologist:  seen in [facility] Note: Chief Complaint  Patient presents with   Follow-up    Post cardioversion; establish primary cardiology care   Atrial Fibrillation    New recurrence of A-fib have usually been diagnosed in the 1990s.  Now status post DCCV   ===================================  ASSESSMENT/PLAN   Problem List Items Addressed This Visit       Cardiology Problems   New onset atrial fibrillation [facility]) - Primary    Not sure if this is a new diagnosis of A-fib or simply recurrence of A-fib after many years without it.  Regardless, in a morbidly obese patient with diabetes and hypertension (metabolic syndrome)-need to exclude ischemic etiology  Plan: Leane Call (she is scared of CT scans and therefore would not want Coronary CTA) Pending results, may want to proceed with 2D echo as well.  Shared Decision Making/Informed Consent The risks [chest pain, shortness of breath, cardiac arrhythmias, dizziness, blood pressure fluctuations, myocardial infarction, stroke/transient ischemic attack, nausea, vomiting, allergic reaction, radiation exposure, metallic taste sensation and life-threatening complications (estimated to be 1 in 10,000)], benefits (risk stratification, diagnosing coronary artery disease, treatment guidance) and alternatives of a nuclear stress test were discussed in detail with Ms. Senk and she agrees to proceed.      Relevant Medications   diltiazem (CARDIZEM CD) 360 MG 24 hr capsule   apixaban (ELIQUIS) 5 MG TABS tablet   Other Relevant Orders   EKG 12-Lead (Completed)   Cardiac Stress Test: Informed Consent Details: Physician/Practitioner Attestation; Transcribe to consent form and obtain patient signature   MYOCARDIAL PERFUSION IMAGING   Essential hypertension (Chronic)     Well-controlled blood pressure on high-dose carvedilol plus diltiazem.  She is also on high-dose lisinopril-HCTZ. Now she is only using Lasix as needed.  She is also on prazosin which I am not sure if this is very urinary issues or if this is for good blood pressure.  If it is her blood pressure we will probably see if he can get rid of it.      Relevant Medications   diltiazem (CARDIZEM CD) 360 MG 24 hr capsule   apixaban (ELIQUIS) 5 MG TABS tablet   Other Relevant Orders   EKG 12-Lead (Completed)   Hyperlipidemia associated with type 2 diabetes mellitus ([place]) (Chronic)    She has diabetes and obesity as well as hypertension all putting her metabolic syndrome category.  As such, LDL goal should be less than 70.  Thankfully, her A1c is 6.2 on current meds.  Plan: Continue rosuvastatin at current dose for now.  Most recent LDL was pretty low in February.  (27). On combination of SGLT2 inhibitor and GLP-1 agonist along with metformin for diabetes.  All of which have at least neutral cardioprotective benefit.      Relevant Medications   diltiazem (CARDIZEM CD) 360 MG 24 hr capsule   apixaban (ELIQUIS) 5 MG TABS tablet   Other Relevant Orders   EKG 12-Lead (Completed)   Persistent atrial fibrillation (HCC) (Chronic)    Thankfully, she is now maintaining sinus rhythm after cardioversion.  She is on combination of high-dose adult diltiazem and high-dose carvedilol without signs of bradycardia.  Need to monitor that closely. She is on Eliquis which were refilled today. Also on ACE inhibitor (ACE inhibitor-HCTZ combo)  Not sure how much symptomatic relief  she had with the cardioversion, but would try to prefer keeping her in sinus rhythm.  Planning ischemic evaluation with Lexiscan Myoview. Continue carvedilol 25 mg twice daily and diltiazem CD 360 mg daily Continue Eliquis 5 mg twice daily      Relevant Medications   diltiazem (CARDIZEM CD) 360 MG 24 hr capsule   apixaban (ELIQUIS)  5 MG TABS tablet   Other Relevant Orders   EKG 12-Lead (Completed)   Cardiac Stress Test: Informed Consent Details: Physician/Practitioner Attestation; Transcribe to consent form and obtain patient signature   MYOCARDIAL PERFUSION IMAGING     Other   Chest discomfort    She did her chest, she was in A-fib as noted in the first ER visit.  It could simply be that that was her symptoms of A-fib, but also need to exclude ischemic etiology.  Plan: Lexiscan Myoview      Relevant Orders   EKG 12-Lead (Completed)   Cardiac Stress Test: Informed Consent Details: Physician/Practitioner Attestation; Transcribe to consent form and obtain patient signature   MYOCARDIAL PERFUSION IMAGING   Controlled type 2 diabetes mellitus without complication, without long-term current use of insulin ([place])   Relevant Orders   EKG 12-Lead (Completed)   Asthma with COPD ([place]) (Chronic)   Relevant Orders   EKG 12-Lead (Completed)   Class 2 severe obesity due to excess calories with serious comorbidity in adult, unspecified BMI (HCC) (Chronic)    Just about morbid obesity with BMI of 39.5 today.  We talked about the extreme importance of weight loss to help stabilize her health trajectory.  The patient understands the need to lose weight with diet and exercise. We have discussed specific strategies for this.      Relevant Orders   EKG 12-Lead (Completed)   Secondary hypercoagulable state (HCC) (Chronic)    CHA2DS2-VASc score at least 4.  Tolerating Eliquis.  Continue without change.  Prescription refilled      Relevant Orders   EKG 12-Lead (Completed)   Other Visit Diagnoses     HYPERTENSION, BENIGN SYSTEMIC       Relevant Medications   diltiazem (CARDIZEM CD) 360 MG 24 hr capsule   apixaban (ELIQUIS) 5 MG TABS tablet       ===================================  HPI:    Shanelle D Jaskowiak is a morbidly obese 66 y.o. female with a PMH notable for DM-2, HTN, OSA, history of Hepatitis (s/p  treatment) with diagnosis of A-fib who is being seen today to establish care as a primary cardiologist at the request of [name], [place].  Originally diagnosed with A-fib, reported that she had an ablation - but actually meant DCCV-she does every TAVR having an invasive procedure.  She was seen by Dr. Rexene Alberts (09/27/2021) for sleep study medicine consult and was found to have irregular heartbeat on auscultation.  Sent to PCP who found A-fib RVR on EKG.  Patient was sent to ([facility]) ER where she was started on Eliquis (CHA2DS2-VASc score 4) and amlodipine changed to diltiazem for rate control.  She was discharged and rate controlled A-fib.  Relatively unaware of being in A-fib besides maybe some increased exertional dyspnea.  [name] was seen by Mr. Fenton, PA (A-fib clinic) on 10/04/2021 as a new patient visit.  Noted being relatively asymptomatic besides some mild worsening exertional dyspnea with A-fib.  No chest pain or pressure.  At this visit she had not yet picked up diltiazem.  No bleeding with Eliquis.  No chest pain or pressure  with exertion.  No PND, orthopnea or edema.  No syncope or near syncope. Arranged for DCCV, continued on carvedilol 25 mg twice daily and started on diltiazem 360 mg daily. => CHA2DS2Vasc 4 (Age, HTN, DM, F) - but likely has aortic plaque which would increase it to 5.  Discussed lifestyle modification with diet and exercise for weight loss. Encouraged follow-up with sleep apnea evaluation.  Seen again on 10/11/2021-noted more fatigue and dyspnea on exertion.  She had started diltiazem with better rate control but felt more fatigued.  Recent Hospitalizations:  DCCV 10/18/2021  Reviewed  CV studies:    The following studies were reviewed today: (if available, images/films reviewed: From Epic Chart or Care Everywhere) 2019 [place] 10/28/2017: Nuclear stress EF: 63%. The left ventricular ejection fraction is normal (55-65%). There was no  ST segment deviation noted during stress. The study is normal.  This is a low risk study.  ECHO1/23/2019 : Normal LV size and function-hyperdynamic/vigorous.  EF 65 to 70%. No RWMA.  Focal basal hypertrophy - Minimal LVOT gradient (2.6 m/s).  GRII DD.  Calcified AoV with no stenosis.  Mild MAC.  Trivial TR.  Peak PAP 55 mmHg.  Sleep study pending  Interval History:   Tacha D Mecum presents to "establish primary cardiology care"after cardioversion.  She seems to doing fine regarding standpoint.  I do not really know that she truly understood what was going on with the A-fib.   She could not tell what she was or was not into A-fib.she feels like perhaps her energy is a little better.  During her last A-fib visit clinic she had a little more fatigue and dyspnea with exertion.-These both seem to be better, but not drastically.  She has some mild swelling off-and-on.  Really only using the Lasix now as needed.  CV Review of Symptoms (Summary): Cardiovascular ROS: no chest pain or dyspnea on exertion positive for - E a bit better since DCCV - otw no change negative for - edema, irregular heartbeat, orthopnea, palpitations, paroxysmal nocturnal dyspnea, rapid heart rate, shortness of breath, or lightheadedness, dizziness, syncope/near syncope, TIA/amaurosis fugax, claudication.  she does have symptoms or diagnosis of sleep apnea. -& Insomnia- HA/tired , unrested she is followed by Dr Rexene Alberts for sleep. - waiting for Insurance to approve sleep study   REVIEWED OF SYSTEMS   Review of Systems  Constitutional:  Positive for malaise/fatigue (This seems to be better.  She is not very active). Negative for weight loss.  HENT:  Negative for nosebleeds.   Respiratory:  Negative for cough, hemoptysis and shortness of breath.   Gastrointestinal:  Negative for blood in stool and melena.  Genitourinary:  Negative for hematuria.  Musculoskeletal:  Positive for joint pain. Negative for myalgias.   Neurological:  Negative for dizziness, focal weakness, weakness and headaches.  Psychiatric/Behavioral:  Positive for memory loss (Just not a very good historian.). The patient is nervous/anxious. The patient does not have insomnia.    I have reviewed and (if needed) personally updated the patient's problem list, medications, allergies, past medical and surgical history, social and family history.   PAST MEDICAL HISTORY   Past Medical History:  Diagnosis Date   Angio-edema    Anxiety    Arthritis    "lower back" (01/29/2017)   Asthma    Bone spur    spine   Chronic lower back pain    Cirrhosis of liver ([place]) 12/2018   Depression    Diabetes mellitus without complication ([place])  Eczema    GERD (gastroesophageal reflux disease)    Hepatitis C    was treated for this   History of atrial fibrillation without current medication    cardioversion in 1990s   Hyperplastic polyp of intestine    Hypertension    Insomnia    Osteoarthritis    Osteoporosis    Splenic artery aneurysm ([place])    Urticaria     PAST SURGICAL HISTORY   Past Surgical History:  Procedure Laterality Date   CARDIOVERSION  1990s   CARDIOVERSION N/A 10/18/2021   Procedure: CARDIOVERSION;  Surgeon: Jerline Pain, MD;  Location: [facility];  Service: Cardiovascular;  Laterality: N/A;   CHOLECYSTECTOMY N/A 01/22/2018   Procedure: LAPAROSCOPIC CHOLECYSTECTOMY ERAS PATHWAY;  Surgeon: Clovis Riley, MD;  Location: [facility];  Service: General;  Laterality: N/A;   CYSTOSCOPY W/ URETERAL STENT PLACEMENT Left 01/29/2017   Procedure: CYSTOSCOPY WITH RETROGRADE PYELOGRAM/URETERAL STENT PLACEMENT;  Surgeon: Ardis Hughs, MD;  Location: [place];  Service: Urology;  Laterality: Left;   CYSTOSCOPY WITH RETROGRADE PYELOGRAM, URETEROSCOPY AND STENT PLACEMENT Left 01/29/2017   CYSTOSCOPY WITH RETROGRADE PYELOGRAM/URETERAL STENT PLACEMENT   NM MYOVIEW LTD  10/28/2017   Nuclear stress EF: 63%. The left ventricular  ejection fraction is normal (55-65%). There was no ST segment deviation noted during stress. The study is normal.  This is a low risk study.   TRANSTHORACIC ECHOCARDIOGRAM  01/30/2017   Normal LV size and function-hyperdynamic/vigorous.  EF 65 to 70%. No RWMA.  Focal basal hypertrophy - Minimal LVOT gradient (2.6 m/s).  GRII DD.  Calcified AoV with no stenosis.  Mild MAC.  Trivial TR.  Peak PAP 55 mmHg.    Immunization History  Administered Date(s) Administered   Fluad Quad(high Dose 65+) 11/18/2020, 10/06/2021   Hepatitis B, adult 10/12/2014, 11/09/2014, 04/11/2015   Influenza Split 11/14/2010, 10/09/2011   Influenza Whole 10/13/2007, 01/10/2009, 11/22/2009   Influenza,inj,Quad PF,6+ Mos 10/10/2012, 10/08/2013, 10/15/2014, 09/29/2015, 11/06/2016, 09/10/2017, 10/15/2018, 09/30/2019   PFIZER(Purple Top)SARS-COV-2 Vaccination 03/20/2019, 04/15/2019, 10/15/2019   PNEUMOCOCCAL CONJUGATE-20 11/18/2020   Pneumococcal Polysaccharide-23 01/10/2009   Td 01/09/1999, 01/10/2009    MEDICATIONS/ALLERGIES   Current Meds  Medication Sig   albuterol (VENTOLIN HFA) 108 (90 Base) MCG/ACT inhaler INHALE 2 PUFFS INTO THE LUNGS EVERY 6 HOURS AS NEEDED FOR WHEEZING OR SHORTNESS OF BREATH   ARIPiprazole (ABILIFY) 5 MG tablet Take 1 tablet (5 mg total) by mouth daily.   ARNUITY ELLIPTA 100 MCG/ACT AEPB INHALE 1 PUFF INTO THE LUNGS DAILY   busPIRone (BUSPAR) 10 MG tablet Take 10 mg by mouth 3 (three) times daily.   carvedilol (COREG) 25 MG tablet TAKE 1 TABLET(25 MG) BY MOUTH TWICE DAILY WITH A MEAL   cetirizine (ZYRTEC) 10 MG tablet Take by mouth.   desvenlafaxine (PRISTIQ) 50 MG 24 hr tablet Take 50 mg by mouth daily.   doxepin (SINEQUAN) 25 MG capsule Take 25 mg by mouth 3 (three) times daily as needed.   EPINEPHrine 0.3 mg/0.3 mL IJ SOAJ injection Use as directed for severe allergic reaction   gabapentin (NEURONTIN) 300 MG capsule Take 1 capsule (300 mg total) by mouth at bedtime.   glucose blood test  strip Test blood sugar daily and record on blood sugar log   JARDIANCE 10 MG TABS tablet TAKE 1 TABLET(10 MG) BY MOUTH DAILY   Lancets (ACCU-CHEK SOFT TOUCH) lancets Check blood sugar daily and record in log   lisinopril-hydrochlorothiazide (ZESTORETIC) 20-25 MG tablet TAKE 1 TABLET BY MOUTH DAILY  meclizine (ANTIVERT) 25 MG tablet Take 1 tablet (25 mg total) by mouth 3 (three) times daily as needed for dizziness.   metFORMIN (GLUCOPHAGE-XR) 500 MG 24 hr tablet TAKE 1 TABLET(500 MG) BY MOUTH DAILY WITH BREAKFAST   mycophenolate (CELLCEPT) 500 MG tablet Take 2 tablets by mouth 2 (two) times daily.   nystatin cream (MYCOSTATIN) APPLY TOPICALLY TO THE AFFECTED AREA TWICE DAILY FOR 7 DAYS THEN AS NEEDED   olopatadine (PATANOL) 0.1 % ophthalmic solution Place 1 drop into both eyes 2 (two) times daily.   oxyCODONE-acetaminophen (PERCOCET) 10-325 MG tablet Take 1 tablet by mouth 5 (five) times daily as needed.   potassium chloride (KLOR-CON) 10 MEQ tablet Take 1 tablet by mouth daily for 3 days then only when lasix taken   prazosin (MINIPRESS) 2 MG capsule Take 2 mg by mouth at bedtime.   rosuvastatin (CRESTOR) 10 MG tablet TAKE 1 TABLET(10 MG) BY MOUTH DAILY   tacrolimus (PROTOPIC) 0.1 % ointment Apply 1 Application topically 2 (two) times daily.   tiZANidine (ZANAFLEX) 4 MG tablet Take 4 mg by mouth 2 (two) times daily.   triamcinolone ointment (KENALOG) 0.1 % Apply 1 application topically 2 (two) times daily.   TRULICITY 5.94 VO/5.9YT SOPN ADMINISTER 0.75 MG UNDER THE SKIN 1 TIME A WEEK   zolpidem (AMBIEN CR) 12.5 MG CR tablet Take 12.5 mg by mouth at bedtime.   [Refilled] apixaban (ELIQUIS) 5 MG TABS tablet Take 1 tablet (5 mg total) by mouth 2 (two) times daily.   [Refilled] diltiazem (CARDIZEM CD) 360 MG 24 hr capsule Take 1 capsule (360 mg total) by mouth daily.    Allergies  Allergen Reactions   Hydrocodone Itching   Linalool    Methylisothiazolinone Other (See Comments)    Other  reaction(s): Other (See Comments) Positive patch test Positive patch test    Propylene Glycol    Aspirin Itching and Other (See Comments)    Lower dose doesn't make her itch (she takes it every day)   Eggs Or Egg-Derived Products Nausea Only and Other (See Comments)    No reaction if in another food   Hydrocodone-Guaifenesin Rash    SOCIAL HISTORY/FAMILY HISTORY   Reviewed in Epic:  Pertinent findings:  Social History   Tobacco Use   Smoking status: Former    Packs/day: 0.25    Years: 37.00    Total pack years: 9.25    Types: Cigarettes    Quit date: 03/30/2008    Years since quitting: 13.6   Smokeless tobacco: Never   Tobacco comments:    Former smoker 10/04/21  Vaping Use   Vaping Use: Never used  Substance Use Topics   Alcohol use: No   Drug use: Never   Social History   Social History Narrative   Caffiene none. Soda 2 daily.   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   Children 6, grandkids 18.       The patient does not have a history of early familial atrial fibrillation or other arrhythmias.  OBJCTIVE -PE, EKG, labs   Wt Readings from Last 3 Encounters:  10/30/21 230 lb 9.6 oz (104.6 kg)  10/18/21 238 lb (108 kg)  10/11/21 238 lb (108 kg)    Physical Exam: BP 108/60   Pulse 67   Ht _0  (1.626 m)   Wt 230 lb 9.6 oz (104.6 kg)   SpO2 97%   BMI 39.58 kg/m  Physical Exam Vitals reviewed.  Constitutional:      General: She  is not in acute distress.    Appearance: Normal appearance. She is obese. She is not ill-appearing (Well groomed.) or toxic-appearing.  HENT:     Head: Normocephalic and atraumatic.  Neck:     Vascular: No carotid bruit or JVD (Unable to assess).  Cardiovascular:     Rate and Rhythm: Normal rate and regular rhythm. Occasional Extrasystoles are present.    Chest Wall: PMI is not displaced (Unable to assess).     Pulses: Decreased pulses (Difficult to palpate due to body habitus).     Heart sounds: S1 normal and S2 normal.  Heart sounds are distant. No murmur heard.    No friction rub. No gallop.  Pulmonary:     Effort: Pulmonary effort is normal. No respiratory distress.     Breath sounds: No wheezing, rhonchi or rales.  Chest:     Chest wall: No tenderness.  Abdominal:     General: Bowel sounds are normal. There is no distension.     Tenderness: There is no abdominal tenderness. There is no guarding or rebound.     Comments: Obese.  Unable to assess HSM or bruit.  Musculoskeletal:        General: Swelling (Mild 1+ bilateral pitting) present.     Cervical back: Normal range of motion and neck supple.  Skin:    General: Skin is warm and dry.  Neurological:     General: No focal deficit present.     Mental Status: She is alert and oriented to person, place, and time.     Gait: Gait abnormal.  Psychiatric:        Mood and Affect: Mood normal.        Behavior: Behavior normal.        Thought Content: Thought content normal.        Judgment: Judgment normal.     Comments: Poor historian    Adult ECG Report  Rate: 67;  Rhythm: normal sinus rhythm and low voltage.  Cannot exclude septal MI, age-indeterminate. ;   Narrative Interpretation: Borderline  Recent Labs:   Reviewed Lab Results  Component Value Date   CHOL 102 02/24/2021   HDL 36.30 (L) 02/24/2021   LDLCALC 27 02/24/2021   TRIG 194.0 (H) 02/24/2021   CHOLHDL 3 02/24/2021   Lab Results  Component Value Date   CREATININE 0.65 10/11/2021   BUN 6 (L) 10/11/2021   NA 139 10/11/2021   K 3.2 (L) 10/11/2021   CL 107 10/11/2021   CO2 23 10/11/2021      Latest Ref Rng & Units 10/11/2021   10:03 AM 09/27/2021   12:21 PM 01/04/2020   10:46 AM  CBC  WBC 4.0 - 10.5 K/uL 5.2  8.0  5.5   Hemoglobin 12.0 - 15.0 g/dL 14.3  15.7  13.0   Hematocrit 36.0 - 46.0 % 45.6  50.1  41.6   Platelets 150 - 400 K/uL 186  219  197.0     Lab Results  Component Value Date   HGBA1C 6.2 (A) 10/06/2021   Lab Results  Component Value Date   TSH 3.34  01/04/2020   CHA2DS2-VASc Score = 4  The patient's score is based upon: CHF History: 0 HTN History: 1 Diabetes History: 1 Stroke History: 0 Vascular Disease History: 0 -> not yet diagnosed Age Score: 1 Gender Score: 1     ================================================== I spent a total of 29 minutes with the patient spent in direct patient consultation.  Additional time spent with  chart review  / charting (studies, outside notes, etc): 27 min Total Time: 56 min  Current medicines are reviewed at length with the patient today.  (+/- concerns) n/a  Notice: This dictation was prepared with Dragon dictation along with smart phrase technology. Any transcriptional errors that result from this process are unintentional and may not be corrected upon review.  Studies Ordered:   Orders Placed This Encounter  Procedures   Cardiac Stress Test: Informed Consent Details: Physician/Practitioner Attestation; Transcribe to consent form and obtain patient signature   MYOCARDIAL PERFUSION IMAGING   EKG 12-Lead   Meds ordered this encounter  Medications   DISCONTD: apixaban (ELIQUIS) 5 MG TABS tablet    Sig: Take 1 tablet (5 mg total) by mouth 2 (two) times daily.    Dispense:  180 tablet    Refill:  1   diltiazem (CARDIZEM CD) 360 MG 24 hr capsule    Sig: Take 1 capsule (360 mg total) by mouth daily.    Dispense:  90 capsule    Refill:  3   apixaban (ELIQUIS) 5 MG TABS tablet    Sig: Take 1 tablet (5 mg total) by mouth 2 (two) times daily.    Dispense:  14 tablet    Refill:  0    Lot [id number]  Exp 07/25 x 1   Shared Decision Making/Informed Consent The risks [chest pain, shortness of breath, cardiac arrhythmias, dizziness, blood pressure fluctuations, myocardial infarction, stroke/transient ischemic attack, nausea, vomiting, allergic reaction, radiation exposure, metallic taste sensation and life-threatening complications (estimated to be 1 in 10,000)], benefits (risk stratification,  diagnosing coronary artery disease, treatment guidance) and alternatives of a nuclear stress test were discussed in detail with Ms. Andrada and she agrees to proceed.   Patient Instructions / Medication Changes & Studies & Tests Ordered   Patient Instructions  Medication Instructions:  Not needed  *If you need a refill on your cardiac medications before your next appointment, please call your pharmacy*   Lab Work: Not needed     Testing/Procedures: Will be schedule at [facility] has requested that you have a lexiscan myoview. Please follow instruction sheet, as given.   Follow-Up: At [facility], you and your health needs are our priority.  As part of our continuing mission to provide you with exceptional heart care, we have created designated Provider Care Teams.  These Care Teams include your primary Cardiologist (physician) and Advanced Practice Providers (APPs -  Physician Assistants and Nurse Practitioners) who all work together to provide you with the care you need, when you need it.     Your next appointment:   1 to 3 month(s)  The format for your next appointment:   In Person  Provider:   Glenetta Hew, MD    Other Instructions     Your doctor has scheduled you for a Myocardial Perfusion scan to obtain information about the blood flow to your heart. The test consists of taking pictures of your heart in two phases: while resting and after a stress test.  The stress test, you will be given a drug intended to have a similar effect  on the heart to that of exercise.  The test will take approximately 3 to 4  hours to complete.  If you are pregnant or breastfeeding,  please notify the staff prior to your test.  How to prepare for your test: Do not eat or drink 2 hours prior to your test Do not  consume products containing caffeine 12 hours prior to your test (examples: coffee (regular OR decaf), chocolate, sodas, tea) Your  doctor may need you to hold certain medications prior to the test.  If so, these are listed below and should not be taken for 24 hours prior to the test.  If not listed below, you may take your medications as normal.  You may resume taking held medications on your normal schedule once the test is complete.   Meds to hold: none Do bring a list of your current medications with you.  If you have held any meds in preparation for the test, please bring them, as you may be required to take them once the test is completed. Do wear comfortable clothes and walking shoes.  Do not wear dresses or overalls. Do NOT wear cologne, perfume, aftershave, or fragranced lotions the day of your test (deodorants okay). If these instructions are not followed your test will have to be rescheduled.   A nuclear cardiologist will review your test, prepare a report and send it to your physician.   If you have questions or concerns about your appointment, you can call the Nuclear Cardiology department at [phone]. If you cannot keep your appointment, please provide 48 hours notification to avoid a possible $50.00 charge to your account.   Please arrive 15 minutes prior to your appointment time for registration and insurance purposes      Leonie Man, MD, MS Glenetta Hew, M.D., M.S. Interventional Cardiologist  [place]  Pager # [phone] Phone # [phone] [address] [place], [place] [address]   Thank you for choosing [facility]!!

## 2021-11-02 ENCOUNTER — Telehealth: Payer: Self-pay | Admitting: Neurology

## 2021-11-02 NOTE — Telephone Encounter (Signed)
[employer] pending uploaded notes

## 2021-11-05 ENCOUNTER — Encounter: Payer: Self-pay | Admitting: Cardiology

## 2021-11-05 NOTE — Assessment & Plan Note (Addendum)
Not sure if this is a new diagnosis of A-fib or simply recurrence of A-fib after many years without it.  Regardless, in a morbidly obese patient with diabetes and hypertension (metabolic syndrome)-need to exclude ischemic etiology  Plan: Leane Call (she is scared of CT scans and therefore would not want Coronary CTA) Pending results, may want to proceed with 2D echo as well.  Shared Decision Making/Informed Consent The risks [chest pain, shortness of breath, cardiac arrhythmias, dizziness, blood pressure fluctuations, myocardial infarction, stroke/transient ischemic attack, nausea, vomiting, allergic reaction, radiation exposure, metallic taste sensation and life-threatening complications (estimated to be 1 in 10,000)], benefits (risk stratification, diagnosing coronary artery disease, treatment guidance) and alternatives of a nuclear stress test were discussed in detail with Stacy Campbell and she agrees to proceed.

## 2021-11-05 NOTE — Assessment & Plan Note (Signed)
She has diabetes and obesity as well as hypertension all putting her metabolic syndrome category.  As such, LDL goal should be less than 70.  Thankfully, her A1c is 6.2 on current meds.  Plan: Continue rosuvastatin at current dose for now.  Most recent LDL was pretty low in February.  (27). On combination of SGLT2 inhibitor and GLP-1 agonist along with metformin for diabetes.  All of which have at least neutral cardioprotective benefit.

## 2021-11-05 NOTE — Assessment & Plan Note (Signed)
Well-controlled blood pressure on high-dose carvedilol plus diltiazem.  She is also on high-dose lisinopril-HCTZ. Now she is only using Lasix as needed.  She is also on prazosin which I am not sure if this is very urinary issues or if this is for good blood pressure.  If it is her blood pressure we will probably see if he can get rid of it.

## 2021-11-05 NOTE — Assessment & Plan Note (Signed)
CHA2DS2-VASc score at least 4.  Tolerating Eliquis.  Continue without change.  Prescription refilled

## 2021-11-05 NOTE — Assessment & Plan Note (Signed)
Thankfully, she is now maintaining sinus rhythm after cardioversion.  She is on combination of high-dose adult diltiazem and high-dose carvedilol without signs of bradycardia.  Need to monitor that closely. She is on Eliquis which were refilled today. Also on ACE inhibitor (ACE inhibitor-HCTZ combo)  Not sure how much symptomatic relief she had with the cardioversion, but would try to prefer keeping her in sinus rhythm.   Planning ischemic evaluation with Lexiscan Myoview.  Continue carvedilol 25 mg twice daily and diltiazem CD 360 mg daily  Continue Eliquis 5 mg twice daily

## 2021-11-05 NOTE — Assessment & Plan Note (Signed)
She did her chest, she was in A-fib as noted in the first ER visit.  It could simply be that that was her symptoms of A-fib, but also need to exclude ischemic etiology.  Plan: [employer]

## 2021-11-05 NOTE — Assessment & Plan Note (Signed)
Just about morbid obesity with BMI of 39.5 today.  We talked about the extreme importance of weight loss to help stabilize her health trajectory.  The patient understands the need to lose weight with diet and exercise. We have discussed specific strategies for this.

## 2021-11-06 ENCOUNTER — Ambulatory Visit (HOSPITAL_COMMUNITY): Payer: Medicare HMO | Attending: Cardiovascular Disease

## 2021-11-06 DIAGNOSIS — I4891 Unspecified atrial fibrillation: Secondary | ICD-10-CM | POA: Insufficient documentation

## 2021-11-06 DIAGNOSIS — I4819 Other persistent atrial fibrillation: Secondary | ICD-10-CM | POA: Diagnosis present

## 2021-11-06 DIAGNOSIS — R0789 Other chest pain: Secondary | ICD-10-CM | POA: Diagnosis not present

## 2021-11-06 HISTORY — PX: NM MYOVIEW LTD: HXRAD82

## 2021-11-06 MED ORDER — TECHNETIUM TC 99M TETROFOSMIN IV KIT
28.0000 | PACK | Freq: Once | INTRAVENOUS | Status: AC | PRN
  Administered 2021-11-07: 28 via INTRAVENOUS

## 2021-11-06 MED ORDER — REGADENOSON 0.4 MG/5ML IV SOLN
0.4000 mg | Freq: Once | INTRAVENOUS | Status: AC
  Administered 2021-11-06: 0.4 mg via INTRAVENOUS

## 2021-11-06 NOTE — Telephone Encounter (Signed)
NPSG- [employer] Josem Kaufmann: [id number] (exp. 11/02/21 to 01/31/22).  Patient is scheduled at [facility] for 12/28/21 at 9 pm.  Mailed packet to the patient.

## 2021-11-07 ENCOUNTER — Telehealth (HOSPITAL_COMMUNITY): Payer: Self-pay

## 2021-11-07 ENCOUNTER — Ambulatory Visit (HOSPITAL_COMMUNITY): Payer: Medicare HMO | Attending: Cardiology

## 2021-11-07 LAB — MYOCARDIAL PERFUSION IMAGING
LV dias vol: 64 mL (ref 46–106)
LV sys vol: 18 mL
Nuc Stress EF: 71 %
Peak HR: 99 {beats}/min
Rest HR: 71 {beats}/min
Rest Nuclear Isotope Dose: 32.9 mCi
SDS: 5
SRS: 0
SSS: 5
ST Depression (mm): 0 mm
Stress Nuclear Isotope Dose: 28 mCi
TID: 1.07

## 2021-11-07 MED ORDER — TECHNETIUM TC 99M TETROFOSMIN IV KIT
32.9000 | PACK | Freq: Once | INTRAVENOUS | Status: AC | PRN
  Administered 2021-11-07: 32.9 via INTRAVENOUS

## 2021-11-16 ENCOUNTER — Other Ambulatory Visit: Payer: Self-pay | Admitting: Gastroenterology

## 2021-11-16 DIAGNOSIS — R110 Nausea: Secondary | ICD-10-CM

## 2021-11-21 ENCOUNTER — Ambulatory Visit
Admission: RE | Admit: 2021-11-21 | Discharge: 2021-11-21 | Disposition: A | Discharge status: Home / Self Care | Payer: Medicare HMO | Source: Ambulatory Visit | Attending: Gastroenterology | Admitting: Gastroenterology

## 2021-11-21 DIAGNOSIS — R110 Nausea: Secondary | ICD-10-CM

## 2021-12-06 ENCOUNTER — Encounter: Payer: Self-pay | Admitting: Cardiology

## 2021-12-06 ENCOUNTER — Ambulatory Visit: Payer: Medicare HMO | Attending: Cardiology | Admitting: Cardiology

## 2021-12-06 VITALS — BP 122/64 | HR 76 | Ht 64.0 in | Wt 233.8 lb

## 2021-12-06 DIAGNOSIS — I10 Essential (primary) hypertension: Secondary | ICD-10-CM

## 2021-12-06 DIAGNOSIS — E1169 Type 2 diabetes mellitus with other specified complication: Secondary | ICD-10-CM | POA: Diagnosis not present

## 2021-12-06 DIAGNOSIS — E119 Type 2 diabetes mellitus without complications: Secondary | ICD-10-CM

## 2021-12-06 DIAGNOSIS — E785 Hyperlipidemia, unspecified: Secondary | ICD-10-CM

## 2021-12-06 DIAGNOSIS — I4819 Other persistent atrial fibrillation: Secondary | ICD-10-CM | POA: Diagnosis not present

## 2021-12-06 DIAGNOSIS — I351 Nonrheumatic aortic (valve) insufficiency: Secondary | ICD-10-CM | POA: Insufficient documentation

## 2021-12-06 DIAGNOSIS — D6869 Other thrombophilia: Secondary | ICD-10-CM

## 2021-12-06 NOTE — Patient Instructions (Addendum)
Medication Instructions:   No changes     Lab Work:  Not needed   Testing/Procedures: Will be schedule at [facility] has requested that you have an echocardiogram. Echocardiography is a painless test that uses sound waves to create images of your heart. It provides your doctor with information about the size and shape of your heart and how well your heart's chambers and valves are working. This procedure takes approximately one hour. There are no restrictions for this procedure. Please do NOT wear cologne, perfume, aftershave, or lotions (deodorant is allowed). Please arrive 15 minutes prior to your appointment time.    Follow-Up: At [facility], you and your health needs are our priority.  As part of our continuing mission to provide you with exceptional heart care, we have created designated Provider Care Teams.  These Care Teams include your primary Cardiologist (physician) and Advanced Practice Providers (APPs -  Physician Assistants and Nurse Practitioners) who all work together to provide you with the care you need, when you need it.     Your next appointment:   5 to 6  month(s)  The format for your next appointment:   In Person  Provider:   Glenetta Hew, MD    Other Instructions    Monitor  if you become tired or fatigue more often , contact office

## 2021-12-06 NOTE — Progress Notes (Signed)
Primary Care Provider: [name], [place] Cardiologist: Glenetta Hew, MD -> Previously seen by Dr. Geraldo Pitter back in 2019.  Electrophysiologist:  seen in [facility] Note: Chief Complaint  Patient presents with   Follow-up    To discuss test results   Atrial Fibrillation    Starting initial evaluation. Is in the process of going for sleep study   ===================================  ASSESSMENT/PLAN   Problem List Items Addressed This Visit       Cardiology Problems   Persistent atrial fibrillation (HCC) - Primary (Chronic)    Required cardioversion persistent A-fib.  Now technically paroxysmal persistent A-fib.  Maintaining sinus rhythm after cardioversion. Is on Eliquis for DOAC prophylaxis. On combination of diltiazem 360 and carvedilol 25 mg twice daily for rate control which seems to be doing the job relatively well although her rate is 92 right now.  Thankfully, no other symptoms.  As long as she remains on DOAC, she  will be protected if she were to require being cardioverted  Based on the fact that was appreciated back on monitor, I would like to get her an echocardiogram just to confirm or deny.  There is also soft murmur heard.  Need to get a good assessment of wall motion  Plan: Continue current dose of diltiazem and carvedilol-heart rate control is close as possible.  Once an option for rhythm control comes, would be inclined to keep going as well.  She is open quick resolution, I explained that this is probably not really issue.  In the absence of active symptoms, will hold off on antiarrhythmic agents.       Relevant Orders   ECHOCARDIOGRAM COMPLETE   Hypertension associated with diabetes ([place]) (Chronic)    Blood pressure looks pretty good today on combination of carvedilol and diltiazem and lisinopril and HCTZ.  Minipress probably has a role as well.      Hyperlipidemia associated with type 2 diabetes mellitus (HCC)  (Chronic)     Other   Controlled type 2 diabetes mellitus without complication, without long-term current use of insulin ([place])    On Jardiance, metformin and Trulicity. Last A1c was 6.2.      Aortic ejection murmur    Soft murmur heard.  Will check 2D echocardiogram just to make sure there is no structural abnormality.      Relevant Orders   ECHOCARDIOGRAM COMPLETE   Secondary hypercoagulable state (HCC) (Chronic)    CHA2DS2-VASc score is 4.  On Eliquis.  Okay to hold 2 to 3 days preop for surgeries or procedures with no bridging.      Class 2 severe obesity due to excess calories with serious comorbidity in adult, unspecified BMI (HCC) (Chronic)   ===================================  HPI:    Stacy Campbell is a morbidly obese 66 y.o. female with a PMH notable for DM-2, HTN, OSA, history of Hepatitis (s/p treatment) with diagnosis of A-fib who is being seen today to establish care as a primary cardiologist at the request of [name], MD.  Originally diagnosed with A-fib, reported that she had an ablation - but actually meant DCCV-she does not remember having an invasive procedure.  She was seen by Dr. Rexene Alberts (09/27/2021) for sleep study medicine consult and was found to have irregular heartbeat on auscultation.  Sent to PCP who found A-fib RVR on EKG.   => Patient was sent to ([facility]) ER where she was started on Eliquis (CHA2DS2-VASc score 4) and  amlodipine changed to diltiazem for rate control.  She was discharged and rate controlled A-fib.  Relatively unaware of being in A-fib besides maybe some increased exertional dyspnea.  Recent Hospitalizations:  09/27/21: [facility] -> was in Afib RVR With [facility] was seen by Mr. [place], PA (A-fib clinic) on 10/04/2021 as a new patient visit.  Noted being relatively asymptomatic besides some mild worsening exertional dyspnea with A-fib.  No chest pain or pressure.   At this visit she had not yet picked up diltiazem.  No bleeding with Eliquis.  No chest pain or pressure with exertion.  No PND, orthopnea or edema.  No syncope or near syncope. Arranged for DCCV, continued on carvedilol 25 mg twice daily and started on diltiazem 360 mg daily. => CHA2DS2Vasc 4 (Age, HTN, DM, F) - but likely has aortic plaque which would increase it to 5.  DCCV 10/18/2021 Discussed lifestyle modification with diet and exercise for weight loss. Encouraged follow-up with sleep apnea evaluation.  Seen again on 10/11/2021-noted more fatigue and dyspnea on exertion.  She had started diltiazem with better rate control but felt more fatigued.  I saw her for  an Initial Visit 10/30/2021 - to "establish primary cardiology care" after cardioversion.  She was doing well from a a cardiac standpoint.  Was not fully understanding A-fib.  Not really understanding whether she was was not in A-fib besides the fact that her energy level is better.  No drastic change.  Mild off-and-on swelling using PRN Lasix rarely. She seems to doing fine regarding standpoint.  I do not really know that she truly understood what was going on with the A-fib.   She could not tell what she was or was not into A-fib.she feels like perhaps her energy is a little better.  During her last A-fib visit clinic she had a little more fatigue and dyspnea with exertion.-These both seem to be better, but not drastically. Referral for [facility] is terrified of CT scans and therefore would not tolerate Coronary CTA. Consider stopping/reducing dose of diltiazem because of fatigue.  F/u With Sleep Med ==> Waiting for Insurance to approve coverage from [facility]- Sleep Study (ordered by Dr. Rexene Alberts); Sx =-waking up with headache, tired and unrested.   Reviewed  CV studies:    The following studies were reviewed today: (if available, images/films reviewed: From Epic Chart or Care Everywhere) 2019 [place] 10/28/2017: Nuclear stress  EF: 63%. The left ventricular ejection fraction is normal (55-65%). There was no ST segment deviation noted during stress. The study is normal.  This is a low risk study.  ECHO1/23/2019 : Normal LV size and function-hyperdynamic/vigorous.  EF 65 to 70%. No RWMA.  Focal basal hypertrophy - Minimal LVOT gradient (2.6 m/s).  GRII DD.  Calcified AoV with no stenosis.  Mild MAC.  Trivial TR.  Peak PAP 55 mmHg.  Sleep study pending  Myoview 11/06/2021: ?  Myocardial infarction located in the anterior region? ->  Despite this, there is a EF of 71% with no RWMA.  READ AS LOW RISK/LOW RISK; in fact, on review of images the anterior defect appeared to be consistent with breast attenuation.  No evidence of ischemia or infarction.  Preserved EF 71%.  Interval History:   AIYA KEACH presents to discuss results of her Myoview because the wording was somewhat difficult.  She is about ready to do her sleep study score.  She seems to be remaining in sinus rhythm,  denying any palpitations.  She denies any chest pain or pressure with rest or exertion, but does get exertional dyspnea she walks fast.  She has not had any recurrent symptoms rapid irregular heartbeats.  No CHF symptoms of PND, orthopnea edema.  No bleeding issues.  No syncope/near syncope or TIA/amaurosis fugax, no claudication.  We reviewed the results of the Myoview: The details of what breast attenuation means.  The fact that she is asymptomatic would argue that that is probably the correct diagnosis.  Interestingly, the reader did not accurately report the results.  There is no comment about wall motion abnormality which would account along with anterior infarct.  REVIEWED OF SYSTEMS   Review of Systems  Constitutional:  Positive for malaise/fatigue (This seems to be better.  She is not very active). Negative for weight loss.  HENT:  Negative for nosebleeds.   Respiratory:  Negative for cough, hemoptysis and shortness of breath.    Gastrointestinal:  Negative for blood in stool and melena.  Genitourinary:  Negative for hematuria.  Musculoskeletal:  Positive for joint pain. Negative for myalgias.  Neurological:  Negative for dizziness, focal weakness, weakness and headaches.  Psychiatric/Behavioral:  Positive for memory loss (Just not a very good historian.). The patient is nervous/anxious. The patient does not have insomnia.    I have reviewed and (if needed) personally updated the patient's problem list, medications, allergies, past medical and surgical history, social and family history.   PAST MEDICAL HISTORY   Past Medical History:  Diagnosis Date   Angio-edema    Anxiety    Arthritis    "lower back" (01/29/2017)   Asthma    Bone spur    spine   Chronic lower back pain    Cirrhosis of liver ([place]) 12/2018   Depression    Diabetes mellitus without complication (HCC)    Eczema    GERD (gastroesophageal reflux disease)    Hepatitis C    was treated for this   Hyperplastic polyp of intestine    Hypertension    Insomnia    OSA (obstructive sleep apnea)    Pending formal sleep study   Osteoarthritis    Osteoporosis    Paroxysmal atrial fibrillation ([place]) 09/27/2021   cardioversion in 1990s; Noted to be in [place] when being evaluated by sleep medicine.   Splenic artery aneurysm ([place])    Urticaria     PAST SURGICAL HISTORY   Past Surgical History:  Procedure Laterality Date   CARDIOVERSION  1990s   CARDIOVERSION N/A 10/18/2021   Procedure: CARDIOVERSION;  Surgeon: Jerline Pain, MD;  Location: [facility];  Service: Cardiovascular;  Laterality: N/A;   CHOLECYSTECTOMY N/A 01/22/2018   Procedure: LAPAROSCOPIC CHOLECYSTECTOMY ERAS PATHWAY;  Surgeon: Clovis Riley, MD;  Location: [facility];  Service: General;  Laterality: N/A;   CYSTOSCOPY W/ URETERAL STENT PLACEMENT Left 01/29/2017   Procedure: CYSTOSCOPY WITH RETROGRADE PYELOGRAM/URETERAL STENT PLACEMENT;  Surgeon: Ardis Hughs, MD;   Location: [facility];  Service: Urology;  Laterality: Left;   CYSTOSCOPY WITH RETROGRADE PYELOGRAM, URETEROSCOPY AND STENT PLACEMENT Left 01/29/2017   CYSTOSCOPY WITH RETROGRADE PYELOGRAM/URETERAL STENT PLACEMENT   NM MYOVIEW LTD  10/28/2017   Nuclear stress EF: 63%. The left ventricular ejection fraction is normal (55-65%). There was no ST segment deviation noted during stress. The study is normal.  This is a low risk study.   NM MYOVIEW LTD  11/06/2021   ?  Myocardial infarction located in the anterior region? ->  Despite this, there  is a EF of 71% with no RWMA.  READ AS LOW RISK/LOW RISK; in fact, on review of images the anterior defect appeared to be consistent with breast attenuation.  No evidence of ischemia or infarction.  Preserved EF 71%.   TRANSTHORACIC ECHOCARDIOGRAM  01/30/2017   Normal LV size and function-hyperdynamic/vigorous.  EF 65 to 70%. No RWMA.  Focal basal hypertrophy - Minimal LVOT gradient (2.6 m/s).  GRII DD.  Calcified AoV with no stenosis.  Mild MAC.  Trivial TR.  Peak PAP 55 mmHg.    Immunization History  Administered Date(s) Administered   Fluad Quad(high Dose 65+) 11/18/2020, 10/06/2021   Hepatitis B, adult 10/12/2014, 11/09/2014, 04/11/2015   Influenza Split 11/14/2010, 10/09/2011   Influenza Whole 10/13/2007, 01/10/2009, 11/22/2009   Influenza,inj,Quad PF,6+ Mos 10/10/2012, 10/08/2013, 10/15/2014, 09/29/2015, 11/06/2016, 09/10/2017, 10/15/2018, 09/30/2019   PFIZER(Purple Top)SARS-COV-2 Vaccination 03/20/2019, 04/15/2019, 10/15/2019   PNEUMOCOCCAL CONJUGATE-20 11/18/2020   Pneumococcal Polysaccharide-23 01/10/2009   Td 01/09/1999, 01/10/2009    MEDICATIONS/ALLERGIES   Current Meds  Medication Sig   albuterol (VENTOLIN HFA) 108 (90 Base) MCG/ACT inhaler INHALE 2 PUFFS INTO THE LUNGS EVERY 6 HOURS AS NEEDED FOR WHEEZING OR SHORTNESS OF BREATH   apixaban (ELIQUIS) 5 MG TABS tablet Take 1 tablet (5 mg total) by mouth 2 (two) times daily.   ARIPiprazole (ABILIFY) 5  MG tablet Take 1 tablet (5 mg total) by mouth daily.   ARNUITY ELLIPTA 100 MCG/ACT AEPB INHALE 1 PUFF INTO THE LUNGS DAILY   busPIRone (BUSPAR) 10 MG tablet Take 10 mg by mouth 3 (three) times daily.   carvedilol (COREG) 25 MG tablet TAKE 1 TABLET(25 MG) BY MOUTH TWICE DAILY WITH A MEAL   cetirizine (ZYRTEC) 10 MG tablet Take by mouth.   desvenlafaxine (PRISTIQ) 50 MG 24 hr tablet Take 50 mg by mouth daily.   diltiazem (CARDIZEM CD) 360 MG 24 hr capsule Take 1 capsule (360 mg total) by mouth daily.   doxepin (SINEQUAN) 25 MG capsule Take 25 mg by mouth 3 (three) times daily as needed.   EPINEPHrine 0.3 mg/0.3 mL IJ SOAJ injection Use as directed for severe allergic reaction   furosemide (LASIX) 20 MG tablet Take 1 tablet by mouth daily for 3 days then only as needed for weight gain/swelling   gabapentin (NEURONTIN) 300 MG capsule Take 1 capsule (300 mg total) by mouth at bedtime.   glucose blood test strip Test blood sugar daily and record on blood sugar log   JARDIANCE 10 MG TABS tablet TAKE 1 TABLET(10 MG) BY MOUTH DAILY   Lancets (ACCU-CHEK SOFT TOUCH) lancets Check blood sugar daily and record in log   lisinopril-hydrochlorothiazide (ZESTORETIC) 20-25 MG tablet TAKE 1 TABLET BY MOUTH DAILY   meclizine (ANTIVERT) 25 MG tablet Take 1 tablet (25 mg total) by mouth 3 (three) times daily as needed for dizziness.   metFORMIN (GLUCOPHAGE-XR) 500 MG 24 hr tablet TAKE 1 TABLET(500 MG) BY MOUTH DAILY WITH BREAKFAST   mycophenolate (CELLCEPT) 500 MG tablet Take 2 tablets by mouth 2 (two) times daily.   nystatin cream (MYCOSTATIN) APPLY TOPICALLY TO THE AFFECTED AREA TWICE DAILY FOR 7 DAYS THEN AS NEEDED   olopatadine (PATANOL) 0.1 % ophthalmic solution Place 1 drop into both eyes 2 (two) times daily.   oxyCODONE-acetaminophen (PERCOCET) 10-325 MG tablet Take 1 tablet by mouth 5 (five) times daily as needed.   potassium chloride (KLOR-CON) 10 MEQ tablet Take 1 tablet by mouth daily for 3 days then only  when lasix taken  prazosin (MINIPRESS) 2 MG capsule Take 2 mg by mouth at bedtime.   rosuvastatin (CRESTOR) 10 MG tablet TAKE 1 TABLET(10 MG) BY MOUTH DAILY   tacrolimus (PROTOPIC) 0.1 % ointment Apply 1 Application topically 2 (two) times daily.   tiZANidine (ZANAFLEX) 4 MG tablet Take 4 mg by mouth 2 (two) times daily.   triamcinolone ointment (KENALOG) 0.1 % Apply 1 application topically 2 (two) times daily.   TRULICITY 3.73 SK/8.7GO SOPN ADMINISTER 0.75 MG UNDER THE SKIN 1 TIME A WEEK   zolpidem (AMBIEN CR) 12.5 MG CR tablet Take 12.5 mg by mouth at bedtime.     Allergies  Allergen Reactions   Hydrocodone Itching   Linalool    Methylisothiazolinone Other (See Comments)    Other reaction(s): Other (See Comments) Positive patch test Positive patch test    Propylene Glycol    Aspirin Itching and Other (See Comments)    Lower dose doesn't make her itch (she takes it every day)   Eggs Or Egg-Derived Products Nausea Only and Other (See Comments)    No reaction if in another food   Hydrocodone-Guaifenesin Rash    SOCIAL HISTORY/FAMILY HISTORY   Reviewed in Epic:  Pertinent findings:  Social History   Tobacco Use   Smoking status: Former    Packs/day: 0.25    Years: 37.00    Total pack years: 9.25    Types: Cigarettes    Quit date: 03/30/2008    Years since quitting: 13.7   Smokeless tobacco: Never   Tobacco comments:    Former smoker 10/04/21  Vaping Use   Vaping Use: Never used  Substance Use Topics   Alcohol use: No   Drug use: Never   Social History   Social History Narrative   Caffiene none. Soda 2 daily.   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   Children 6, grandkids 18.       The patient does not have a history of early familial atrial fibrillation or other arrhythmias.  OBJCTIVE -PE, EKG, labs   Wt Readings from Last 3 Encounters:  12/15/21 232 lb (105.2 kg)  12/06/21 233 lb 12.5 oz (106 kg)  11/06/21 230 lb (104.3 kg)    Physical Exam: BP  122/64   Pulse 76   Ht _0  (1.626 m)   Wt 233 lb 12.5 oz (106 kg)   SpO2 90%   PF 92 L/min   BMI 40.13 kg/m  Physical Exam Vitals reviewed.  Constitutional:      General: She is not in acute distress.    Appearance: Normal appearance. She is obese. She is not ill-appearing (Well groomed.) or toxic-appearing.  HENT:     Head: Normocephalic and atraumatic.  Neck:     Vascular: No carotid bruit or JVD (Unable to assess).  Cardiovascular:     Rate and Rhythm: Normal rate and regular rhythm. Occasional Extrasystoles are present.    Chest Wall: PMI is not displaced (Unable to assess).     Pulses: Decreased pulses (Difficult to palpate due to body habitus).     Heart sounds: S1 normal and S2 normal. Heart sounds are distant. Murmur heard.     High-pitched harsh midsystolic murmur is present with a grade of 2/6 at the upper right sternal border radiating to the neck.     No friction rub. No gallop.  Pulmonary:     Effort: Pulmonary effort is normal. No respiratory distress.     Breath sounds: No wheezing, rhonchi or rales.  Chest:  Chest wall: No tenderness.  Abdominal:     General: Bowel sounds are normal. There is no distension.     Tenderness: There is no abdominal tenderness. There is no guarding or rebound.     Comments: Obese.  Unable to assess HSM or bruit.  Musculoskeletal:        General: Swelling (Mild 1+ bilateral pitting) present.     Cervical back: Normal range of motion and neck supple.  Skin:    General: Skin is warm and dry.  Neurological:     General: No focal deficit present.     Mental Status: She is alert and oriented to person, place, and time.     Gait: Gait abnormal.  Psychiatric:        Mood and Affect: Mood normal.        Behavior: Behavior normal.        Thought Content: Thought content normal.        Judgment: Judgment normal.     Comments: Poor historian    Adult ECG Report  Rate: 67;  Rhythm: normal sinus rhythm and low voltage.  Cannot  exclude septal MI, age-indeterminate. ;   Narrative Interpretation: Borderline  Recent Labs:   Reviewed Lab Results  Component Value Date   CHOL 102 02/24/2021   HDL 36.30 (L) 02/24/2021   LDLCALC 27 02/24/2021   TRIG 194.0 (H) 02/24/2021   CHOLHDL 3 02/24/2021   Lab Results  Component Value Date   CREATININE 0.93 12/15/2021   BUN 16 12/15/2021   NA 142 12/15/2021   K 3.8 12/15/2021   CL 103 12/15/2021   CO2 29 12/15/2021      Latest Ref Rng & Units 10/11/2021   10:03 AM 09/27/2021   12:21 PM 01/04/2020   10:46 AM  CBC  WBC 4.0 - 10.5 K/uL 5.2  8.0  5.5   Hemoglobin 12.0 - 15.0 g/dL 14.3  15.7  13.0   Hematocrit 36.0 - 46.0 % 45.6  50.1  41.6   Platelets 150 - 400 K/uL 186  219  197.0     Lab Results  Component Value Date   HGBA1C 6.7 (A) 12/15/2021   Lab Results  Component Value Date   TSH 3.34 01/04/2020   CHA2DS2-VASc Score = 4  The patient's score is based upon: CHF History: 0 HTN History: 1 Diabetes History: 1 Stroke History: 0 Vascular Disease History: 0 -> not yet diagnosed Age Score: 1 Gender Score: 1     ================================================== I spent a total of 20 minutes with the patient spent in direct patient consultation.  Additional time spent with chart review  / charting (studies, outside notes, etc): 17 min Total Time: 37 min  Current medicines are reviewed at length with the patient today.  (+/- concerns) n/a  Notice: This dictation was prepared with Dragon dictation along with smart phrase technology. Any transcriptional errors that result from this process are unintentional and may not be corrected upon review.  Studies Ordered:   Orders Placed This Encounter  Procedures   ECHOCARDIOGRAM COMPLETE   No orders of the defined types were placed in this encounter.  Shared Decision Making/Informed Consent The risks [chest pain, shortness of breath, cardiac arrhythmias, dizziness, blood pressure fluctuations, myocardial  infarction, stroke/transient ischemic attack, nausea, vomiting, allergic reaction, radiation exposure, metallic taste sensation and life-threatening complications (estimated to be 1 in 10,000)], benefits (risk stratification, diagnosing coronary artery disease, treatment guidance) and alternatives of a nuclear stress test were discussed in detail  with Ms. Campusano and she agrees to proceed.   Patient Instructions / Medication Changes & Studies & Tests Ordered   Patient Instructions  Medication Instructions:   No changes     Lab Work:  Not needed   Testing/Procedures: Will be schedule at [facility] has requested that you have an echocardiogram. Echocardiography is a painless test that uses sound waves to create images of your heart. It provides your doctor with information about the size and shape of your heart and how well your heart's chambers and valves are working. This procedure takes approximately one hour. There are no restrictions for this procedure. Please do NOT wear cologne, perfume, aftershave, or lotions (deodorant is allowed). Please arrive 15 minutes prior to your appointment time.    Follow-Up: At [facility], you and your health needs are our priority.  As part of our continuing mission to provide you with exceptional heart care, we have created designated Provider Care Teams.  These Care Teams include your primary Cardiologist (physician) and Advanced Practice Providers (APPs -  Physician Assistants and Nurse Practitioners) who all work together to provide you with the care you need, when you need it.     Your next appointment:   5 to 6  month(s)  The format for your next appointment:   In Person  Provider:   Glenetta Hew, MD    Other Instructions    Monitor  if you become tired or fatigue more often , contact office      Leonie Man, MD, MS Glenetta Hew, M.D., M.S. Interventional Cardiologist  [facility]  Pager # [phone] Phone # [phone] [address] [place], [place] [address]   Thank you for choosing [facility]!!

## 2021-12-12 ENCOUNTER — Ambulatory Visit: Payer: Medicare HMO | Admitting: Family Medicine

## 2021-12-15 ENCOUNTER — Ambulatory Visit (INDEPENDENT_AMBULATORY_CARE_PROVIDER_SITE_OTHER): Payer: Medicare HMO | Admitting: Family Medicine

## 2021-12-15 ENCOUNTER — Encounter: Payer: Self-pay | Admitting: Family Medicine

## 2021-12-15 VITALS — BP 132/72 | HR 84 | Temp 98.0°F | Ht 64.0 in | Wt 232.0 lb

## 2021-12-15 DIAGNOSIS — F339 Major depressive disorder, recurrent, unspecified: Secondary | ICD-10-CM | POA: Diagnosis not present

## 2021-12-15 DIAGNOSIS — I4819 Other persistent atrial fibrillation: Secondary | ICD-10-CM

## 2021-12-15 DIAGNOSIS — E785 Hyperlipidemia, unspecified: Secondary | ICD-10-CM

## 2021-12-15 DIAGNOSIS — I152 Hypertension secondary to endocrine disorders: Secondary | ICD-10-CM

## 2021-12-15 DIAGNOSIS — E119 Type 2 diabetes mellitus without complications: Secondary | ICD-10-CM

## 2021-12-15 DIAGNOSIS — E876 Hypokalemia: Secondary | ICD-10-CM

## 2021-12-15 DIAGNOSIS — E1169 Type 2 diabetes mellitus with other specified complication: Secondary | ICD-10-CM

## 2021-12-15 DIAGNOSIS — E1159 Type 2 diabetes mellitus with other circulatory complications: Secondary | ICD-10-CM

## 2021-12-15 LAB — BASIC METABOLIC PANEL
BUN: 16 mg/dL (ref 6–23)
CO2: 29 mEq/L (ref 19–32)
Calcium: 9.7 mg/dL (ref 8.4–10.5)
Chloride: 103 mEq/L (ref 96–112)
Creatinine, Ser: 0.93 mg/dL (ref 0.40–1.20)
GFR: 64.09 mL/min (ref >60.00)
Glucose, Bld: 120 mg/dL — ABNORMAL HIGH (ref 70–99)
Potassium: 3.8 mEq/L (ref 3.5–5.1)
Sodium: 142 mEq/L (ref 135–145)

## 2021-12-15 LAB — POCT GLYCOSYLATED HEMOGLOBIN (HGB A1C): Hemoglobin A1C: 6.7 % — AB (ref 4.0–5.6)

## 2021-12-15 MED ORDER — ROSUVASTATIN CALCIUM 10 MG PO TABS: ORAL_TABLET | ORAL | 3 refills | Status: DC

## 2021-12-15 MED ORDER — TRULICITY 0.75 MG/0.5ML ~~LOC~~ SOAJ: SUBCUTANEOUS | 3 refills | Status: DC

## 2021-12-15 MED ORDER — METFORMIN HCL ER 500 MG PO TB24: 500.0000 mg | ORAL_TABLET | Freq: Every day | ORAL | 3 refills | Status: DC

## 2021-12-15 MED ORDER — EMPAGLIFLOZIN 10 MG PO TABS: ORAL_TABLET | ORAL | 3 refills | Status: DC

## 2021-12-15 MED ORDER — CARVEDILOL 25 MG PO TABS: ORAL_TABLET | ORAL | 3 refills | Status: DC

## 2021-12-15 MED ORDER — LISINOPRIL-HYDROCHLOROTHIAZIDE 20-25 MG PO TABS: 1.0000 | ORAL_TABLET | Freq: Every day | ORAL | 3 refills | Status: DC

## 2021-12-15 NOTE — Progress Notes (Signed)
Subjective  CC: f/u visit  HPI: Stacy Campbell is a 66 y.o. female who presents to the office today for follow up of diabetes and problems listed above in the chief complaint.  Diabetes follow up: Her diabetic control is reported as Unchanged. On metformin, trulicity and jardiance. Weight is stable. No sxs of hyperglycemia.  She denies exertional CP or SOB or symptomatic hypoglycemia. She denies foot sores or paresthesias.  S/p cardioversion for afib; I reviewed notes. Bp have been good on cardizem and carvedilol. Never had palpitations or sxs. No cp or sob Depression: stable on meds; has flares.  Hld on statin. Due recheck in February.  Had low potassium prior to cardioversion; needs recheck. On hctz, rare lasix use  Wt Readings from Last 3 Encounters:  12/15/21 232 lb (105.2 kg)  12/06/21 233 lb 12.5 oz (106 kg)  11/06/21 230 lb (104.3 kg)    BP Readings from Last 3 Encounters:  12/15/21 132/72  12/06/21 122/64  10/30/21 108/60    Assessment  1. Controlled type 2 diabetes mellitus without complication, without long-term current use of insulin ([place])   2. Major depression, recurrent, chronic ([place])   3. Hypertension associated with diabetes ([place])   4. Hyperlipidemia associated with type 2 diabetes mellitus (H[facility])   5. Persistent atrial fibrillation ([place])   6. Hypokalemia      Plan  Diabetes is currently very well controlled. No med changes today Depression is stable on mood meds HTN: good control by recheck. No med changes. Refilled today. Recheck bmp Hld continue statin Afib s/p cardioversion and remains in sinus on cardizem and carvedilol   Follow up: 3 mo for cpe. Orders Placed This Encounter  Procedures   Basic metabolic panel   POCT HgB A1C   Meds ordered this encounter  Medications   carvedilol (COREG) 25 MG tablet    Sig: TAKE 1 TABLET(25 MG) BY MOUTH TWICE DAILY WITH A MEAL    Dispense:  180 tablet    Refill:  3   empagliflozin (JARDIANCE) 10 MG  TABS tablet    Sig: TAKE 1 TABLET(10 MG) BY MOUTH DAILY    Dispense:  90 tablet    Refill:  3   lisinopril-hydrochlorothiazide (ZESTORETIC) 20-25 MG tablet    Sig: Take 1 tablet by mouth daily.    Dispense:  90 tablet    Refill:  3   metFORMIN (GLUCOPHAGE-XR) 500 MG 24 hr tablet    Sig: Take 1 tablet (500 mg total) by mouth daily with breakfast.    Dispense:  90 tablet    Refill:  3   rosuvastatin (CRESTOR) 10 MG tablet    Sig: TAKE 1 TABLET(10 MG) BY MOUTH DAILY    Dispense:  90 tablet    Refill:  3   Dulaglutide (TRULICITY) 1.19 JY/7.8GN SOPN    Sig: ADMINISTER 0.75 MG UNDER THE SKIN 1 TIME A WEEK    Dispense:  6 mL    Refill:  3      Immunization History  Administered Date(s) Administered   Fluad Quad(high Dose 65+) 11/18/2020, 10/06/2021   Hepatitis B, adult 10/12/2014, 11/09/2014, 04/11/2015   Influenza Split 11/14/2010, 10/09/2011   Influenza Whole 10/13/2007, 01/10/2009, 11/22/2009   Influenza,inj,Quad PF,6+ Mos 10/10/2012, 10/08/2013, 10/15/2014, 09/29/2015, 11/06/2016, 09/10/2017, 10/15/2018, 09/30/2019   PFIZER(Purple Top)SARS-COV-2 Vaccination 03/20/2019, 04/15/2019, 10/15/2019   PNEUMOCOCCAL CONJUGATE-20 11/18/2020   Pneumococcal Polysaccharide-23 01/10/2009   Td 01/09/1999, 01/10/2009    Diabetes Related Lab Review: Lab Results  Component Value Date  HGBA1C 6.7 (A) 12/15/2021   HGBA1C 6.2 (A) 10/06/2021   HGBA1C 6.4 (A) 06/12/2021    Lab Results  Component Value Date   MICROALBUR <0.7 02/24/2021   Lab Results  Component Value Date   CREATININE 0.65 10/11/2021   BUN 6 (L) 10/11/2021   NA 139 10/11/2021   K 3.2 (L) 10/11/2021   CL 107 10/11/2021   CO2 23 10/11/2021   Lab Results  Component Value Date   CHOL 102 02/24/2021   CHOL 140 02/15/2020   CHOL 174 01/04/2020   Lab Results  Component Value Date   HDL 36.30 (L) 02/24/2021   HDL 37.50 (L) 02/15/2020   HDL 38.10 (L) 01/04/2020   Lab Results  Component Value Date   LDLCALC 27  02/24/2021   LDLCALC 73 02/15/2020   LDLCALC 104 (H) 01/04/2020   Lab Results  Component Value Date   TRIG 194.0 (H) 02/24/2021   TRIG 148.0 02/15/2020   TRIG 159.0 (H) 01/04/2020   Lab Results  Component Value Date   CHOLHDL 3 02/24/2021   CHOLHDL 4 02/15/2020   CHOLHDL 5 01/04/2020   No results found for: "LDLDIRECT" The ASCVD Risk score (Arnett DK, et al., 2019) failed to calculate for the following reasons:   The valid total cholesterol range is 130 to 320 mg/dL I have reviewed the PMH, Fam and Soc history. Patient Active Problem List   Diagnosis Date Noted   Cirrhosis of liver ([place]) - h/o hep C 01/04/2020    Priority: High   Monoclonal gammopathy of unknown significance ([facility]) 01/23/2019    Priority: High   Controlled type 2 diabetes mellitus without complication, without long-term current use of insulin ([place]) 12/12/2018    Priority: High    Diagnosed 2020    Hyperlipidemia associated with type 2 diabetes mellitus ([place]) 12/12/2018    Priority: High    LDL goal of 70 given diabetes    Asthma with COPD ([place]) 05/25/2013    Priority: High   Class 2 severe obesity due to excess calories with serious comorbidity in adult, unspecified BMI ([place])     Priority: High   Major depression, recurrent, chronic ([place]) 03/07/2006    Priority: High    Sees psychiatry: Sharon Seller, [place]; [facility]otherapy with Zella Ball, PsyD    Hypertension associated with diabetes ([place]) 03/07/2006    Priority: High   Chronic low back pain 03/07/2006    Priority: High   Insomnia 03/07/2006    Priority: High    On chronic Ambien (started by previous provider).     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  Followed by hep clinic.   Undetectable viral load - Cured.    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 Her smoking use included cigarettes. She has a 9.25 pack-year smoking history. She has never used smokeless tobacco. She reports that she does not drink alcohol and does not use drugs.  Review of Systems: Ophthalmic: negative for eye pain, loss of vision or double vision Cardiovascular: negative for chest pain Respiratory: negative for SOB or persistent cough Gastrointestinal: negative for abdominal pain Genitourinary: negative for dysuria or gross hematuria MSK: negative for foot lesions Neurologic: negative for weakness or gait disturbance  Objective  Vitals: BP 132/72   Pulse 84   Temp 98 F (36.7 C)   Ht '5\' 4"'$  (1.626 m)   Wt 232 lb (105.2 kg)   SpO2 96%   BMI 39.82 kg/m  General: well appearing, no acute distress  Psych:  Alert and oriented, normal mood and affect HEENT:  Normocephalic, atraumatic, moist mucous membranes, supple neck  Cardiovascular:  Nl S1 and S2, RRR +systolic murmur, gallop or rub. no edema Respiratory:  Good breath sounds bilaterally, CTAB with normal effort, no rales     Diabetic education: ongoing education  regarding chronic disease management for diabetes was given today. We continue to reinforce the ABC's of diabetic management: A1c (<7 or 8 dependent upon patient), tight blood pressure control, and cholesterol management with goal LDL < 100 minimally. We discuss diet strategies, exercise recommendations, medication options and possible side effects. At each visit, we review recommended immunizations and preventive care recommendations for diabetics and stress that good diabetic control can prevent other problems. See below for this patient's data.   Commons side effects, risks, benefits, and alternatives for medications and treatment plan prescribed today were discussed, and the patient expressed understanding of the given instructions. Patient is instructed to call or message via MyChart if he/she has any questions or concerns regarding our treatment plan. No barriers to understanding were identified. We discussed Red Flag symptoms and signs in detail. Patient expressed understanding regarding what to do in case of urgent or emergency type symptoms.  Medication list was reconciled, printed and provided to the patient in AVS. Patient instructions and summary information was reviewed with the patient as documented in the AVS. This note was prepared with assistance of Dragon voice recognition software. Occasional wrong-word or sound-a-like substitutions may have occurred due to the inherent limitations of voice recognition software

## 2021-12-15 NOTE — Patient Instructions (Signed)
Please return in 3 months for your annual complete physical; please come fasting.   I have refilled your medications today.   Thank you for bringing your daughter!! Angela Nevin UXYBFXOVA!  If you have any questions or concerns, please don't hesitate to send me a message via MyChart or call the office at [phone]. Thank you for visiting with [place] today! It's our pleasure caring for you.

## 2021-12-24 ENCOUNTER — Encounter: Payer: Self-pay | Admitting: Cardiology

## 2021-12-24 NOTE — Assessment & Plan Note (Addendum)
CHA2DS2-VASc score is 4.  On Eliquis.  Okay to hold 2 to 3 days preop for surgeries or procedures with no bridging.

## 2021-12-24 NOTE — Assessment & Plan Note (Signed)
Blood pressure looks pretty good today on combination of carvedilol and diltiazem and lisinopril and HCTZ.  Minipress probably has a role as well.

## 2021-12-24 NOTE — Assessment & Plan Note (Signed)
On Jardiance, metformin and Trulicity. Last A1c was 6.2.

## 2021-12-24 NOTE — Assessment & Plan Note (Signed)
Soft murmur heard.  Will check 2D echocardiogram just to make sure there is no structural abnormality.

## 2021-12-24 NOTE — Assessment & Plan Note (Addendum)
Required cardioversion persistent A-fib.  Now technically paroxysmal persistent A-fib.  Maintaining sinus rhythm after cardioversion. Is on Eliquis for DOAC prophylaxis. On combination of diltiazem 360 and carvedilol 25 mg twice daily for rate control which seems to be doing the job relatively well although her rate is 92 right now.  Thankfully, no other symptoms.  As long as she remains on DOAC, she  will be protected if she were to require being cardioverted  Based on the fact that was appreciated back on monitor, I would like to get her an echocardiogram just to confirm or deny.  There is also soft murmur heard.  Need to get a good assessment of wall motion  Plan: Continue current dose of diltiazem and carvedilol-heart rate control is close as possible.  Once an option for rhythm control comes, would be inclined to keep going as well.  She is open quick resolution, I explained that this is probably not really issue.  In the absence of active symptoms, will hold off on antiarrhythmic agents.

## 2021-12-25 ENCOUNTER — Other Ambulatory Visit: Payer: Self-pay | Admitting: Family Medicine

## 2021-12-28 ENCOUNTER — Ambulatory Visit: Payer: Medicare HMO

## 2021-12-28 ENCOUNTER — Ambulatory Visit (HOSPITAL_COMMUNITY): Payer: Medicare HMO | Attending: Cardiology

## 2021-12-28 DIAGNOSIS — I351 Nonrheumatic aortic (valve) insufficiency: Secondary | ICD-10-CM | POA: Diagnosis present

## 2021-12-28 DIAGNOSIS — I4819 Other persistent atrial fibrillation: Secondary | ICD-10-CM | POA: Diagnosis present

## 2021-12-28 LAB — ECHOCARDIOGRAM COMPLETE
Area-P 1/2: 3.44 cm2
S' Lateral: 2.5 cm

## 2021-12-28 NOTE — Telephone Encounter (Signed)
Patient called and canceled her SS appt due to her being sick.

## 2022-01-06 ENCOUNTER — Other Ambulatory Visit: Payer: Self-pay | Admitting: Family Medicine

## 2022-01-06 DIAGNOSIS — J4489 Other specified chronic obstructive pulmonary disease: Secondary | ICD-10-CM

## 2022-01-09 NOTE — Telephone Encounter (Signed)
Patient returned my call she is r/s for 03/13/22 at 8 pm.  Mailed new packet to the patient.

## 2022-01-09 NOTE — Telephone Encounter (Signed)
Patient left two voicemail's on my phone while I was out of the office to r/s her SS.  I called her back but no answer. I left her a voicemail to call back to r/s her SS.

## 2022-01-28 ENCOUNTER — Other Ambulatory Visit: Payer: Self-pay | Admitting: Family Medicine

## 2022-02-02 NOTE — Telephone Encounter (Signed)
[employer]- [employer] pending new auth on the [employer] portal.

## 2022-02-12 NOTE — Telephone Encounter (Signed)
Updated auth info.  [employer]- [employer] Josem Kaufmann: [id number] (exp. 02/02/22 to 05/03/22)   Patient is scheduled at [facility] for 03/13/22 at 8 pm

## 2022-02-27 ENCOUNTER — Other Ambulatory Visit: Payer: Self-pay | Admitting: Family Medicine

## 2022-03-13 ENCOUNTER — Ambulatory Visit (INDEPENDENT_AMBULATORY_CARE_PROVIDER_SITE_OTHER): Payer: Medicare HMO | Admitting: Neurology

## 2022-03-13 DIAGNOSIS — I499 Cardiac arrhythmia, unspecified: Secondary | ICD-10-CM

## 2022-03-13 DIAGNOSIS — G4700 Insomnia, unspecified: Secondary | ICD-10-CM

## 2022-03-13 DIAGNOSIS — G4733 Obstructive sleep apnea (adult) (pediatric): Secondary | ICD-10-CM

## 2022-03-13 DIAGNOSIS — G4734 Idiopathic sleep related nonobstructive alveolar hypoventilation: Secondary | ICD-10-CM

## 2022-03-13 DIAGNOSIS — E669 Obesity, unspecified: Secondary | ICD-10-CM

## 2022-03-13 DIAGNOSIS — F439 Reaction to severe stress, unspecified: Secondary | ICD-10-CM

## 2022-03-13 DIAGNOSIS — R519 Headache, unspecified: Secondary | ICD-10-CM

## 2022-03-13 DIAGNOSIS — F1190 Opioid use, unspecified, uncomplicated: Secondary | ICD-10-CM

## 2022-03-13 DIAGNOSIS — Z79899 Other long term (current) drug therapy: Secondary | ICD-10-CM

## 2022-03-13 DIAGNOSIS — Z8679 Personal history of other diseases of the circulatory system: Secondary | ICD-10-CM

## 2022-03-13 DIAGNOSIS — Z9189 Other specified personal risk factors, not elsewhere classified: Secondary | ICD-10-CM

## 2022-03-13 DIAGNOSIS — R351 Nocturia: Secondary | ICD-10-CM

## 2022-03-15 NOTE — Addendum Note (Signed)
Addended by: Star Age on: 03/15/2022 04:42 PM   Modules accepted: Orders

## 2022-03-15 NOTE — Procedures (Signed)
Physician Interpretation:     [facility] POLYSOMNOGRAPHY  INTERPRETATION REPORT   STUDY DATE:  03/13/2022     PATIENT NAME:  Stacy Campbell         DATE OF BIRTH:  1955/02/15  PATIENT ID:  [id number]    TYPE OF STUDY:  PSG  READING PHYSICIAN: Star Age, MD, PhD   SCORING TECHNICIAN: Richard Miu, RPSGT  Referred by: Sharon Seller, NP  History and Indication for Testing: 67 year old woman with an underlying complex medical history of hypertension, hepatitis C, liver cirrhosis, anxiety, depression, asthma, arthritis, chronic low back pain, splenic artery aneurysm, eczema, reflux disease, history of A-fib,?osteoporosis, and obesity, who reports snoring and difficulty initiating and maintaining sleep. Her Epworth sleepiness score is 1/24,?fatigue severity score is 61 out of 63. Height: 64 in Weight: 232 lb (BMI 39) Neck Size: 0 in   MEDICATIONS: Ventolin, Norvasc, Abilify, Arnuity Ellipta, Buspar, Coreg, Pristiq, Sinequan, Epinephrine, Gabapentin, Jardiance, Zestoretic, Antivert, Glucophage, Cellcept, Mycostatin, Patanol, Percocet, Crestor, Zanaflex, Kenalog, Trulicity, Ambien  TECHNICAL DESCRIPTION: A registered sleep technologist was in attendance for the duration of the recording.  Data collection, scoring, video monitoring, and reporting were performed in compliance with the AASM Manual for the Scoring of Sleep and Associated Events; (Hypopnea is scored based on the criteria listed in Section VIII D. 1b in the AASM Manual V2.6 using a 4% oxygen desaturation rule or Hypopnea is scored based on the criteria listed in Section VIII D. 1a in the AASM Manual V2.6 using 3% oxygen desaturation and /or arousal rule).   SLEEP CONTINUITY AND SLEEP ARCHITECTURE:  Lights-out was at 22:09: and lights-on at  05:00:, with a total recording time of 6 hours, 51.5 minutes. Total sleep time ( TST) was 380.0 minutes with a normal sleep efficiency at 92.3%.   BODY  POSITION:  TST was divided  between the following sleep positions: 56.2% supine;  43.8% lateral;  0% prone. Duration of total sleep and percent of total sleep in their respective position is as follows: supine 213 minutes (56%), non-supine 167 minutes (44%); right 00 minutes (0%), left 166 minutes (44%), and prone 00 minutes (0%).  Total supine REM sleep time was 88 minutes (100% of total REM sleep).  Sleep latency was normal at 17.0 minutes.  REM sleep latency was increased at 186.5 minutes. Of the total sleep time, the percentage of stage N1 sleep was 0.8%, stage N2 sleep was 45%, which is normal, stage N3 sleep was 30.8%, which is increased, and REM sleep was 23.2%, which is normal. Wake after sleep onset (WASO) time accounted for 14.5 minutes with minimal sleep fragmentation noted.   RESPIRATORY MONITORING:  Based on CMS criteria (using a 4% oxygen desaturation rule for scoring hypopneas), there were 1 apneas (1 obstructive; 0 central; 0 mixed), and 76 hypopneas.  Apnea index was 0.2. Hypopnea index was 12.0. The apnea-hypopnea index was 12.2/hour, overall (18.8/hour supine, 0 non-supine; 42.3/hour during REM, 42.3 supine REM).  There were 0 respiratory effort-related arousals (RERAs).  The RERA index was 0 events/h. Total respiratory disturbance index (RDI) was 12.2 events/h. RDI results showed: supine RDI  18.8 /h; non-supine RDI 3.6 /h; REM RDI 42.3 /h, supine REM RDI 42.3 /h.   Based on AASM criteria (using a 3% oxygen desaturation and /or arousal rule for scoring hypopneas), there were 1 apneas (1 obstructive; 0 central; 0 mixed), and 77 hypopneas. Apnea index was 0.2. Hypopnea index was 12.2. The apnea-hypopnea index was 12.3 overall (19.1 supine,  0 non-supine; 43.0 REM, 43.0 supine REM).  There were 0 respiratory effort-related arousals (RERAs).  The RERA index was 0 events/h. Total respiratory disturbance index (RDI) was 12.3 events/h. RDI results showed: supine RDI  19.1 /h; non-supine RDI 3.6  /h; REM RDI 43.0 /h, supine REM RDI 43.0 /h.   OXIMETRY: Oxyhemoglobin Saturation Nadir during sleep was at  63% from a mean of 92%.  Of the Total sleep time (TST)   hypoxemia (=<88%) was present for  43.6 minutes, or 11.5% of total sleep time.   LIMB MOVEMENTS: There were 0 periodic limb movements of sleep (0.0/hr), of which 0 (0.0/hr) were associated with an arousal.  AROUSAL: There were 42 arousals in total, for an arousal index of 7 arousals/hour.  Of these, 2 were identified as respiratory-related arousals (0 /h), 0 were PLM-related arousals (0 /h), and 50 were non-specific arousals (8 /h).  EEG: Review of the EEG showed no abnormal electrical discharges and symmetrical bihemispheric findings.    EKG: The EKG revealed normal sinus rhythm (NSR). The average heart rate during sleep was 64 bpm.  AUDIO/VIDEO REVIEW: The audio and video review did not show any abnormal or unusual behaviors, movements, phonations or vocalizations. The patient took no restroom breaks. Snoring was in the mild range.  POST-STUDY QUESTIONNAIRE: Post study, the patient indicated, that sleep was better than usual.   IMPRESSION:  1. Obstructive Sleep Apnea (OSA) 2. Nocturnal Hypoxemia  RECOMMENDATIONS:  1. This study demonstrates overall mild obstructive sleep apnea, but severe in REM sleep with a total AHI of 12.2/hour, REM AHI of 42.3/hour, supine AHI of 18.8/hour and O2 nadir of 63% with significant desaturations during supine REM sleep and time below or at 88% saturation of over 40 minutes, indicating nocturnal hypoxemia. Given these findings and patient's medical history, as well as related complaints, treatment with positive airway pressure is recommended; this can be achieved in the form of autoPAP. Alternatively, a full-night CPAP titration study would allow optimization of therapy if needed. Other treatment options may include avoidance of supine sleep position along with weight loss, or the use of an oral  appliance in selected patients. Please note, that untreated obstructive sleep apnea may carry additional perioperative morbidity. Patients with significant obstructive sleep apnea should receive perioperative PAP therapy and the surgeons and particularly the anesthesiologist should be informed of the diagnosis and the severity of the sleep disordered breathing. 2. The patient should be cautioned not to drive, work at heights, or operate dangerous or heavy equipment when tired or sleepy. Review and reiteration of good sleep hygiene measures should be pursued with any patient. 3. The patient will be seen in follow-up by Dr. Rexene Alberts at [facility] for discussion of the test results and further management strategies. The referring provider will be notified of the test results.   I certify that I have reviewed the entire raw data recording prior to the issuance of this report in accordance with the Standards of Accreditation of the American Academy of Sleep Medicine (AASM).  Star Age, MD, PhD Medical Director, [facility]) [place], ABPN (Neurology and Sleep)              Technical Report:   General Information  Name: Stacy Campbell, Stacy Campbell BMI: 39.82 Physician: Star Age, MD  ID: [id number] Height: 64.0 in Technician: Richard Miu, RPSGT  Sex: Female Weight: 232.0 lb Record: [id number]  Age: 56 [November 22, 1955] Date: 03/13/2022    Medical & Medication History    67 year old  right-handed woman with an underlying complex medical history of hypertension, hepatitis C, liver cirrhosis, anxiety, depression, asthma, arthritis, chronic low back pain, splenic artery aneurysm, eczema, reflux disease, history of A-fib, osteoporosis, and obesity, who reports snoring and difficulty initiating and maintaining sleep.  Ventolin, Norvasc, Abilify, Arnuity Ellipta, Buspar, Coreg, Pristiq, Sinequan, Epinephrine, Gabapentin, Jardiance, Zestoretic, Antivert, Glucophage,  Cellcept, Mycostatin, Patanol, Percocet, Crestor, Zanaflex, Kenalog, Trulicity, Ambien   Sleep Disorder      Comments   The patient came into the sleep lab for a PSG. The patient took Ambien and Zanaflex prior to the start of the study. No restroom trips. EKG kept in NSR. Mild snoring noted. All respiratory events scored with a 4% desat. The patient slept supine and lateral. AHI was 2.8 after 2 hrs of TST. Majority of respiratory events were during REM.     Lights out: 10:09:00 PM Lights on: 05:00:34 AM   Time Total Supine Side Prone Upright  Recording (TRT) 6h 51.62m3h 43.071mh 8.7159m21m 0.7m 89m0.7m  96mep (TST) 6h 20.7m 3h37m.7159m 2h 63m7159m 0h 047159m 0h 0.17159m  Late57m N1 N2 N3 REM Onset Per. Slp. Eff.  Actual 0h 0.7m 0h 0.7159m97m 14.7159m82m 6.7159m 024m7.7m 067159m7.7m 9249m%   [id number] Wake N1 N2 N3 REM  Total 31.5 3.0 172.0 117.0 88.0  Supine 9.5 1.0 62.0 62.5 88.0  Side 22.0 2.0 110.0 54.5 0.0  Prone 0.0 0.0 0.0 0.0 0.0  Upright 0.0 0.0 0.0 0.0 0.0   Stg % Wake N1 N2 N3 REM  Total 7.7 0.8 45.3 30.8 23.2  Supine 2.3 0.3 16.3 16.4 23.2  Side 5.3 0.5 28.9 14.3 0.0  Prone 0.0 0.0 0.0 0.0 0.0  Upright 0.0 0.0 0.0 0.0 0.0     Apnea Summary Sub Supine Side Prone Upright  Total 1 Total 1 0 1 0 0    REM 0 0 0 0 0    NREM 1 0 1 0 0  Obs 1 REM 0 0 0 0 0    NREM 1 0 1 0 0  Mix 0 REM 0 0 0 0 0    NREM 0 0 0 0 0  Cen 0 REM 0 0 0 0 0    NREM 0 0 0 0 0   Rera Summary Sub Supine Side Prone Upright  Total 0 Total 0 0 0 0 0    REM 0 0 0 0 0    NREM 0 0 0 0 0   Hypopnea Summary Sub Supine Side Prone Upright  Total 77 Total 77 68 9 0 0    REM 63 63 0 0 0    NREM '14 5 9 '$ 0 0   4% Hypopnea Summary Sub Supine Side Prone Upright  Total (4%) 76 Total 76 67 9 0 0    REM 62 62 0 0 0    NREM '14 5 9 '$ 0 0     AHI Total Obs Mix Cen  12.32 Apnea 0.16 0.16 0.00 0.00   Hypopnea 12.16 -- -- --  12.16 Hypopnea (4%) 12.00 -- -- --    Total Supine Side Prone Upright  Position AHI 12.32 19.11 3.60 0.00 0.00   REM AHI 42.95   NREM AHI 3.08   Position RDI 12.32 19.11 3.60 0.00 0.00  REM RDI 42.95   NREM RDI 3.08    4% Hypopnea Total Supine Side Prone Upright  Position AHI (4%) 12.16 18.83 3.60 0.00 0.00  REM AHI (4%) 42.27   NREM AHI (4%) 3.08   Position RDI (4%) 12.16 18.83 3.60 0.00 0.00  REM RDI (4%) 42.27   NREM RDI (4%) 3.08    Desaturation Information Threshold: 2% <100% <90% <80% <70% <60% <50% <40%  Supine 92.0 54.0 21.0 6.0 0.0 0.0 0.0  Side 37.0 3.0 0.0 0.0 0.0 0.0 0.0  Prone 0.0 0.0 0.0 0.0 0.0 0.0 0.0  Upright 0.0 0.0 0.0 0.0 0.0 0.0 0.0  Total 129.0 57.0 21.0 6.0 0.0 0.0 0.0  Index 19.6 8.7 3.2 0.9 0.0 0.0 0.0   Threshold: 3% <100% <90% <80% <70% <60% <50% <40%  Supine 66.0 53.0 21.0 6.0 0.0 0.0 0.0  Side 10.0 3.0 0.0 0.0 0.0 0.0 0.0  Prone 0.0 0.0 0.0 0.0 0.0 0.0 0.0  Upright 0.0 0.0 0.0 0.0 0.0 0.0 0.0  Total 76.0 56.0 21.0 6.0 0.0 0.0 0.0  Index 11.6 8.5 3.2 0.9 0.0 0.0 0.0   Threshold: 4% <100% <90% <80% <70% <60% <50% <40%  Supine 65.0 53.0 21.0 6.0 0.0 0.0 0.0  Side 10.0 3.0 0.0 0.0 0.0 0.0 0.0  Prone 0.0 0.0 0.0 0.0 0.0 0.0 0.0  Upright 0.0 0.0 0.0 0.0 0.0 0.0 0.0  Total 75.0 56.0 21.0 6.0 0.0 0.0 0.0  Index 11.4 8.5 3.2 0.9 0.0 0.0 0.0   Threshold: 3% <100% <90% <80% <70% <60% <50% <40%  Supine 66 53 21 6 0 0 0  Side 10 3 0 0 0 0 0  Prone 0 0 0 0 0 0 0  Upright 0 0 0 0 0 0 0  Total 76 56 21 6 0 0 0   Awakening/Arousal Information # of Awakenings 15  Wake after sleep onset 14.85m Wake after persistent sleep 14.582m Arousal Assoc. Arousals Index  Apneas 0 0.0  Hypopneas 2 0.3  Leg Movements 0 0.0  Snore 0 0.0  PTT Arousals 0 0.0  Spontaneous 50 7.9  Total 52 8.2  Leg Movement Information PLMS LMs Index  Total LMs during PLMS 0 0.0  LMs w/ Microarousals 0 0.0   LM LMs Index  w/ Microarousal 0 0.0  w/ Awakening 0 0.0  w/ Resp Event 0 0.0  Spontaneous 0 0.0  Total 0 0.0     Desaturation threshold setting: 3% Minimum desaturation  setting: 10 seconds SaO2 nadir: 63% The longest event was a 105 sec obstructive Hypopnea with a minimum SaO2 of 66%. The lowest SaO2 was 63% associated with a 90 sec obstructive Hypopnea. EKG Rates EKG Avg Max Min  Awake 67 93 50  Asleep 64 86 54  EKG Events: N/A

## 2022-03-16 ENCOUNTER — Other Ambulatory Visit: Payer: Self-pay | Admitting: Family Medicine

## 2022-03-22 ENCOUNTER — Telehealth: Payer: Self-pay

## 2022-03-22 NOTE — Telephone Encounter (Signed)
-----   Message from Star Age, MD sent at 03/15/2022  4:42 PM EST ----- Patient referred by Sharon Seller, NP, seen by me on 09/27/21, diagnostic PSG on 03/13/22.    Please call and notify the patient that the recent sleep study showed sleep apnea, which was severe during her REM sleep with severe desaturations, as low as 63%. I recommend treatment for this in the form of autoPAP, which means, that we don't have to bring her back for a second sleep study with CPAP, but will let him try an autoPAP machine at home, through a DME company (of her choice, or as per insurance requirement). The DME representative will educate her on how to use the machine, how to put the mask on, etc. I have placed an order in the chart. Please send referral, talk to patient, send report to referring MD. We will need a FU in sleep clinic for 10 weeks post-PAP set up, please arrange that with me or one of our NPs. Thanks,   Star Age, MD, PhD [facility])

## 2022-03-22 NOTE — Telephone Encounter (Signed)
I called patient to discuss. No answer, left a message asking her to call us back. 

## 2022-03-22 NOTE — Telephone Encounter (Signed)
Patient returned my call. I advised pt that Dr. Rexene Alberts reviewed their sleep study results and found that pt has severe osa during REM sleep with severe O2 desaturations. Dr. Rexene Alberts recommends that pt start an auto pap at home. I reviewed PAP compliance expectations with the pt. Pt is agreeable to starting an auto-PAP. I advised pt that an order will be sent to a DME, [facility], and [facility] will call the pt within about one week after they file with the pt's insurance. [facility] will show the pt how to use the machine, fit for masks, and troubleshoot the auto-PAP if needed. A follow up appt was made for insurance purposes with Jinny Blossom, NP on 07/03/22 at 11:30am. Pt verbalized understanding to arrive 15 minutes early and bring their auto-PAP.  Pt verbalized understanding of results. Pt had no questions at this time but was encouraged to call back if questions arise. I have sent the order to [facility] and have received confirmation that they have received the order.

## 2022-04-10 ENCOUNTER — Other Ambulatory Visit: Payer: Self-pay | Admitting: Family Medicine

## 2022-04-10 DIAGNOSIS — Z1231 Encounter for screening mammogram for malignant neoplasm of breast: Secondary | ICD-10-CM

## 2022-04-16 LAB — HM DIABETES EYE EXAM

## 2022-04-24 ENCOUNTER — Other Ambulatory Visit: Payer: Self-pay | Admitting: Cardiology

## 2022-04-24 DIAGNOSIS — I4819 Other persistent atrial fibrillation: Secondary | ICD-10-CM

## 2022-04-24 NOTE — Telephone Encounter (Signed)
Prescription refill request for Eliquis received. Indication: a fib Last office visit: 12/06/21 Scr: 0.93 12/15/21 epic Age: 67 Weight: 105kg

## 2022-05-08 ENCOUNTER — Ambulatory Visit: Payer: Medicare HMO | Attending: Cardiology | Admitting: Cardiology

## 2022-05-08 ENCOUNTER — Encounter: Payer: Self-pay | Admitting: Cardiology

## 2022-05-08 VITALS — BP 134/60 | HR 71 | Ht 64.0 in | Wt 236.0 lb

## 2022-05-08 DIAGNOSIS — Z7984 Long term (current) use of oral hypoglycemic drugs: Secondary | ICD-10-CM

## 2022-05-08 DIAGNOSIS — I4819 Other persistent atrial fibrillation: Secondary | ICD-10-CM

## 2022-05-08 DIAGNOSIS — E785 Hyperlipidemia, unspecified: Secondary | ICD-10-CM

## 2022-05-08 DIAGNOSIS — E1159 Type 2 diabetes mellitus with other circulatory complications: Secondary | ICD-10-CM | POA: Diagnosis not present

## 2022-05-08 DIAGNOSIS — E1169 Type 2 diabetes mellitus with other specified complication: Secondary | ICD-10-CM

## 2022-05-08 DIAGNOSIS — Z6841 Body Mass Index (BMI) 40.0 and over, adult: Secondary | ICD-10-CM

## 2022-05-08 DIAGNOSIS — I152 Hypertension secondary to endocrine disorders: Secondary | ICD-10-CM

## 2022-05-08 DIAGNOSIS — I351 Nonrheumatic aortic (valve) insufficiency: Secondary | ICD-10-CM | POA: Diagnosis not present

## 2022-05-08 DIAGNOSIS — D6869 Other thrombophilia: Secondary | ICD-10-CM

## 2022-05-08 NOTE — Progress Notes (Signed)
Primary Care Provider: Willow Ora, MD [facility] Cardiologist: Bryan Lemma, MD Electrophysiologist: None  Clinic Note: Chief Complaint  Patient presents with   Follow-up    Doing well - no complaints   Atrial Fibrillation    No recurrent spells - no bleeding   ===================================  ASSESSMENT/PLAN   Problem List Items Addressed This Visit       Cardiology Problems   Persistent atrial fibrillation (HCC) (Chronic)    Likely, she is maintaining sinus rhythm now remains on carvedilol 25 mg twice daily and diltiazem 360 mg daily.  Not on true antiarrhythmic agent.  Tolerating Eliquis 5 mg twice daily with no bleeding issues.      Relevant Medications   amLODipine (NORVASC) 10 MG tablet   Other Relevant Orders   EKG 12-Lead (Completed)   Hypertension associated with diabetes (HCC) - Primary (Chronic)    BP was better on recheck.  She told me she had just taken her medications before she left and was rushing to get here.  On reassessment looks much better on current dose of diltiazem, carvedilol, amlodipine and lisinopril and HCTZ.  She is also on Minipress.  For diabetes she is currently on Jardiance, Trulicity and metformin-per her recommendations, she would like to discuss with her PCP potentially switching from Trulicity to either [place] or Ozempic that both have a better indication for weight loss as well.  Defer to PCP      Relevant Medications   amLODipine (NORVASC) 10 MG tablet   Hyperlipidemia associated with type 2 diabetes mellitus (HCC) (Chronic)    Has not had labs checked since last February.  Probably needs to have it checked by PCP soon.  She remains on 10 mg rosuvastatin  As of February 2023, LDL was 27.  Triglycerides are little elevated but that could be related to glycemic control.      Relevant Medications   amLODipine (NORVASC) 10 MG tablet     Other   Secondary hypercoagulable state (HCC) (Chronic)    This  patients CHA2DS2-VASc Score and unadjusted Ischemic Stroke Rate (% per year) is equal to 7.2 % stroke rate/year from a score of 5  Above score calculated as 1 point each if present [CHF, HTN, DM, Vascular=MI/PAD/Aortic Plaque, Age if 65-74, or Female] Above score calculated as 2 points each if present [Age > 75, or Stroke/TIA/TE]  Appropriately on Eliquis 5 mg twice daily.  No bleeding issues. Okay to hold Eliquis for procedures or surgeries: for very low risk procedures 24 hours of ductal, for most standard procedures 48 hours, or high risk procedure 72 hours.       Morbid obesity with BMI of 40.0-44.9, adult (HCC) (Chronic)    She has decided that she wants to make concerted effort to lose weight with diet change and exercise.  Unfortunately she has gained weight and she feels bad about it.  As such, she has plans to make adjustments especially over the summer when the weather is better to get back and exercising.      Aortic ejection murmur (Chronic)    Echo shows a moderate septal LVH but normal valves.  I suspect that her murmur could be related to either aortic sclerosis or mild LVOT gradient.  Regardless, nothing significant.  Continue to monitor.       ===================================  HPI:    Stacy Campbell is a morbidly obese 67 y.o. female with a PMH notable for OSA, DM-2, HTN, history of hep C,  with Paroxysmal Persistent A-Fib (requiring cardioversion-originally thought that it was ablation, but was actually DCCV, CHA2DS2-VASc score 4) below who presents today for 4-23-month follow-up.  Was being evaluated for OSA in the sleep medicine consult and noted to be in A-fib RVR on EKG.  Presented to [facility].  Started on Eliquis and amlodipine was changed to diltiazem for rate control.  She was unaware of being in A-fib besides some increased exertional dyspnea. => Was set up for DCCV (10/18/2021).  Converted to carvedilol 25 mg twice daily and diltiazem 360 mg  daily.  I originally saw her in October 2023 to cardiac care after DCCV.  Because she was terrified of CT scans, we sent her for a Lexiscan Myoview for ischemic evaluation.  We are still waiting on sleep study evaluation.  Myoview was read as possible anterior MI and with no RWMA and therefore likely artifact.  EF is 71%  Andree D Peraza was last seen on December 06, 2021.  I wanted to discuss the Myoview because of the confusing report.  She was evaluated for her sleep study.  She is maintaining sinus rhythm and denies any palpitations.  Some exertional dyspnea walking fast but otherwise doing okay.  Mild malaise, with exercise Intolerance but not very active.  Poor memory; joint pain. => We ordered an echo just to confirm wall motion  Recent Hospitalizations: none  Reviewed  CV studies:    The following studies were reviewed today: (if available, images/films reviewed: From Epic Chart or Care Everywhere) TTE 12/28/2021: Normal EF of 60 to 65%.  No RWMA.  Moderate septal LVH with GR 1 DD.  Normal RV with normal RVP and RAP.  Normal aortic and mitral valve.-Normal study.  Interval History:   MADISSON WALKENHORST returns here today for 60-month follow-up doing pretty well.  She has been doing her best to try to lose weight with increasing exercise and change her diet.  She is walking and doing water aerobics.  She also is considering pursuing nutrition counseling.  She has not had any symptoms to suggest breakthrough spells of A-fib.  No rapid irregular heartbeats or palpitations.  No chest pain or pressure at rest or exertion.  No PND orthopnea or edema.  No syncope/near syncope or TIA/amaurosis fugax.  No claudication.  She asked about potentially switching to a different GLP-1 agonist with more potential for weight loss.  I recommended that she discuss this with her PCP. No longer noting exercise intolerance.  She is much more active.  More limited by joint pains now.  REVIEWED OF  SYSTEMS   Review of Systems  Constitutional:  Negative for malaise/fatigue and weight loss.  HENT:  Negative for congestion.   Respiratory:  Negative for cough and shortness of breath.   Gastrointestinal:  Negative for blood in stool and melena.  Genitourinary:  Negative for hematuria.  Musculoskeletal:  Positive for joint pain.  Neurological:  Negative for dizziness and focal weakness.  Psychiatric/Behavioral:  Positive for memory loss (Mild). Negative for depression. The patient is not nervous/anxious and does not have insomnia.    I have reviewed and (if needed) personally updated the patient's problem list, medications, allergies, past medical and surgical history, social and family history.   PAST MEDICAL HISTORY   Past Medical History:  Diagnosis Date   Angio-edema    Anxiety    Arthritis    "lower back" (01/29/2017)   Asthma    Bone spur    spine   Chronic  lower back pain    Cirrhosis of liver (HCC) 12/2018   Depression    Diabetes mellitus without complication (HCC)    Eczema    GERD (gastroesophageal reflux disease)    Hepatitis C    was treated for this   Hyperplastic polyp of intestine    Hypertension    Insomnia    OSA (obstructive sleep apnea)    Pending formal sleep study   Osteoarthritis    Osteoporosis    Paroxysmal atrial fibrillation (HCC) 09/27/2021   cardioversion in 1990s; Noted to be in Afib-RVR when being evaluated by sleep medicine.   Splenic artery aneurysm (HCC)    Urticaria     PAST SURGICAL HISTORY   Past Surgical History:  Procedure Laterality Date   CARDIOVERSION  1990s   CARDIOVERSION N/A 10/18/2021   Procedure: CARDIOVERSION;  Surgeon: Jake Bathe, MD;  Location: [facility];  Service: Cardiovascular;  Laterality: N/A;   CHOLECYSTECTOMY N/A 01/22/2018   Procedure: LAPAROSCOPIC CHOLECYSTECTOMY ERAS PATHWAY;  Surgeon: Berna Bue, MD;  Location: [facility] ORS;  Service: General;  Laterality: N/A;   CYSTOSCOPY W/ URETERAL STENT  PLACEMENT Left 01/29/2017   Procedure: CYSTOSCOPY WITH RETROGRADE PYELOGRAM/URETERAL STENT PLACEMENT;  Surgeon: Crist Fat, MD;  Location: [facility] OR;  Service: Urology;  Laterality: Left;   CYSTOSCOPY WITH RETROGRADE PYELOGRAM, URETEROSCOPY AND STENT PLACEMENT Left 01/29/2017   CYSTOSCOPY WITH RETROGRADE PYELOGRAM/URETERAL STENT PLACEMENT   NM MYOVIEW LTD  10/28/2017   Nuclear stress EF: 63%. The left ventricular ejection fraction is normal (55-65%). There was no ST segment deviation noted during stress. The study is normal.  This is a low risk study.   NM MYOVIEW LTD  11/06/2021   ?  Myocardial infarction located in the anterior region? ->  Despite this, there is a EF of 71% with no RWMA.  READ AS LOW RISK/LOW RISK; in fact, on review of images the anterior defect appeared to be consistent with breast attenuation.  No evidence of ischemia or infarction.  Preserved EF 71%.   TRANSTHORACIC ECHOCARDIOGRAM  01/30/2017   Normal LV size and function-hyperdynamic/vigorous.  EF 65 to 70%. No RWMA.  Focal basal hypertrophy - Minimal LVOT gradient (2.6 m/s).  GRII DD.  Calcified AoV with no stenosis.  Mild MAC.  Trivial TR.  Peak PAP 55 mmHg.    MEDICATIONS/ALLERGIES   Current Meds  Medication Sig   albuterol (VENTOLIN HFA) 108 (90 Base) MCG/ACT inhaler INHALE 2 PUFFS INTO THE LUNGS EVERY 6 HOURS AS NEEDED FOR WHEEZING OR SHORTNESS OF BREATH   amLODipine (NORVASC) 10 MG tablet Take 10 mg by mouth daily.   apixaban (ELIQUIS) 5 MG TABS tablet TAKE 1 TABLET(5 MG) BY MOUTH TWICE DAILY   ARIPiprazole (ABILIFY) 5 MG tablet Take 1 tablet (5 mg total) by mouth daily.   ARNUITY ELLIPTA 100 MCG/ACT AEPB INHALE 1 PUFF INTO THE LUNGS DAILY   busPIRone (BUSPAR) 10 MG tablet Take 10 mg by mouth 3 (three) times daily.   carvedilol (COREG) 25 MG tablet TAKE 1 TABLET(25 MG) BY MOUTH TWICE DAILY WITH A MEAL   cetirizine (ZYRTEC) 10 MG tablet Take by mouth.   desvenlafaxine (PRISTIQ) 50 MG 24 hr tablet Take 50 mg  by mouth daily.   diltiazem (CARDIZEM CD) 360 MG 24 hr capsule Take 1 capsule (360 mg total) by mouth daily.   doxepin (SINEQUAN) 25 MG capsule Take 25 mg by mouth 3 (three) times daily as needed.   Dulaglutide (TRULICITY) 0.75 MG/0.5ML SOPN ADMINISTER  0.75 MG UNDER THE SKIN 1 TIME A WEEK   empagliflozin (JARDIANCE) 10 MG TABS tablet TAKE 1 TABLET(10 MG) BY MOUTH DAILY   EPINEPHrine 0.3 mg/0.3 mL IJ SOAJ injection Use as directed for severe allergic reaction   furosemide (LASIX) 20 MG tablet Take 1 tablet by mouth daily for 3 days then only as needed for weight gain/swelling   gabapentin (NEURONTIN) 300 MG capsule TAKE 1 CAPSULE(300 MG) BY MOUTH AT BEDTIME   glucose blood test strip Test blood sugar daily and record on blood sugar log   Lancets (ACCU-CHEK SOFT TOUCH) lancets Check blood sugar daily and record in log   lisinopril-hydrochlorothiazide (ZESTORETIC) 20-25 MG tablet Take 1 tablet by mouth daily.   meclizine (ANTIVERT) 25 MG tablet Take 1 tablet (25 mg total) by mouth 3 (three) times daily as needed for dizziness.   metFORMIN (GLUCOPHAGE-XR) 500 MG 24 hr tablet Take 1 tablet (500 mg total) by mouth daily with breakfast.   mycophenolate (CELLCEPT) 500 MG tablet Take 2 tablets by mouth 2 (two) times daily.   nystatin cream (MYCOSTATIN) APPLY TOPICALLY TO THE AFFECTED AREA TWICE DAILY FOR 7 DAYS THEN AS NEEDED   olopatadine (PATANOL) 0.1 % ophthalmic solution Place 1 drop into both eyes 2 (two) times daily.   oxyCODONE-acetaminophen (PERCOCET) 10-325 MG tablet Take 1 tablet by mouth 5 (five) times daily as needed.   potassium chloride (KLOR-CON) 10 MEQ tablet Take 1 tablet by mouth daily for 3 days then only when lasix taken   prazosin (MINIPRESS) 2 MG capsule Take 2 mg by mouth at bedtime.   rosuvastatin (CRESTOR) 10 MG tablet TAKE 1 TABLET(10 MG) BY MOUTH DAILY   tacrolimus (PROTOPIC) 0.1 % ointment Apply 1 Application topically 2 (two) times daily.   tiZANidine (ZANAFLEX) 4 MG tablet  Take 4 mg by mouth 2 (two) times daily.   triamcinolone ointment (KENALOG) 0.1 % Apply 1 application topically 2 (two) times daily.   zolpidem (AMBIEN CR) 12.5 MG CR tablet Take 12.5 mg by mouth at bedtime.    Allergies  Allergen Reactions   Hydrocodone Itching   Linalool    Methylisothiazolinone Other (See Comments)    Other reaction(s): Other (See Comments) Positive patch test Positive patch test    Propylene Glycol    Aspirin Itching and Other (See Comments)    Lower dose doesn't make her itch (she takes it every day)   Egg-Derived Products Nausea Only and Other (See Comments)    No reaction if in another food   Hydrocodone-Guaifenesin Rash    SOCIAL HISTORY/FAMILY HISTORY   Reviewed in Epic:  Pertinent findings:  Social History   Tobacco Use   Smoking status: Former    Packs/day: 0.25    Years: 37.00    Additional pack years: 0.00    Total pack years: 9.25    Types: Cigarettes    Quit date: 03/30/2008    Years since quitting: 14.1   Smokeless tobacco: Never   Tobacco comments:    Former smoker 10/04/21  Vaping Use   Vaping Use: Never used  Substance Use Topics   Alcohol use: No   Drug use: Never   Social History   Social History Narrative   Caffiene none. Soda 2 daily.   Education: 12 th grade.Working Development worker, community.   Children 6, grandkids 18.        OBJCTIVE -PE, EKG, labs   Wt Readings from Last 3 Encounters:  05/08/22 236 lb (107 kg)  12/15/21 232 lb (105.2  kg)  12/06/21 233 lb 12.5 oz (106 kg)    Physical Exam: BP 134/60   Pulse 71   Ht 5\' 4"  (1.626 m)   Wt 236 lb (107 kg)   BMI 40.51 kg/m  Vitals:   05/08/22 0941 05/08/22 0958  BP: (!) 140/60 134/60  Pulse: 71   Height: 5\' 4"  (1.626 m)   Weight: 236 lb (107 kg)   BMI (Calculated): 40.49     Physical Exam Vitals reviewed.  Constitutional:      General: She is not in acute distress.    Appearance: Normal appearance. She is obese. She is not ill-appearing (Well-groomed.   Healthy-appearing.) or toxic-appearing.  HENT:     Head: Normocephalic and atraumatic.  Neck:     Vascular: No carotid bruit or JVD.  Cardiovascular:     Rate and Rhythm: Normal rate and regular rhythm. No extrasystoles are present.    Chest Wall: PMI is not displaced (Difficult to palpate).     Pulses: Intact distal pulses. Decreased pulses (Diminished due to body habitus and trivial edema).     Heart sounds: S1 normal and S2 normal. Heart sounds are distant. Murmur heard.     High-pitched harsh crescendo-decrescendo early systolic murmur is present with a grade of 2/6 at the upper right sternal border radiating to the neck.     No friction rub. No gallop.  Pulmonary:     Effort: Pulmonary effort is normal. No respiratory distress.     Breath sounds: Normal breath sounds. No wheezing, rhonchi or rales.  Musculoskeletal:        General: Swelling (Trivial-1+ bilateral pitting edema) present. Normal range of motion.     Cervical back: Normal range of motion and neck supple.  Skin:    General: Skin is warm and dry.  Neurological:     General: No focal deficit present.     Mental Status: She is alert and oriented to person, place, and time. Mental status is at baseline.  Psychiatric:        Mood and Affect: Mood normal.        Behavior: Behavior normal.        Thought Content: Thought content normal.        Judgment: Judgment normal.     Comments: Poor historian     Adult ECG Report  Rate: 71 ;  Rhythm: normal sinus rhythm and Low voltage; otherwise normal axis, intervals U& durations ;   Narrative Interpretation: Borderline - otherwise normal  Recent Labs:  due to have labs checked by PCP in June   Lab Results  Component Value Date   CHOL 102 02/24/2021   HDL 36.30 (L) 02/24/2021   LDLCALC 27 02/24/2021   TRIG 194.0 (H) 02/24/2021   CHOLHDL 3 02/24/2021   Lab Results  Component Value Date   CREATININE 0.93 12/15/2021   BUN 16 12/15/2021   NA 142 12/15/2021   K 3.8  12/15/2021   CL 103 12/15/2021   CO2 29 12/15/2021      Latest Ref Rng & Units 10/11/2021   10:03 AM 09/27/2021   12:21 PM 01/04/2020   10:46 AM  CBC  WBC 4.0 - 10.5 K/uL 5.2  8.0  5.5   Hemoglobin 12.0 - 15.0 g/dL 30.8  65.7  84.6   Hematocrit 36.0 - 46.0 % 45.6  50.1  41.6   Platelets 150 - 400 K/uL 186  219  197.0     Lab Results  Component Value Date  HGBA1C 6.7 (A) 12/15/2021   Lab Results  Component Value Date   TSH 3.34 01/04/2020    ================================================== I spent a total of 18 minutes with the patient spent in direct patient consultation.  Additional time spent with chart review  / charting (studies, outside notes, etc): 17 min Total Time: 35 min  Current medicines are reviewed at length with the patient today.  (+/- concerns) - Asked about changing to Ozempic or Mounjaro   Notice: This dictation was prepared with Dragon dictation along with smart phrase technology. Any transcriptional errors that result from this process are unintentional and may not be corrected upon review.  Studies Ordered:   Orders Placed This Encounter  Procedures   EKG 12-Lead   No orders of the defined types were placed in this encounter.   Patient Instructions / Medication Changes & Studies & Tests Ordered   Patient Instructions  Medication Instructions:  No changes  *If you need a refill on your cardiac medications before your next appointment, please call your pharmacy*   Lab Work: Not needed  Please is have  your primary  draw labs for LIPID in June when you have the rest of your labs done.  Testing/Procedures:  Not needed  Follow-Up: At [facility], you and your health needs are our priority.  As part of our continuing mission to provide you with exceptional heart care, we have created designated Provider Care Teams.  These Care Teams include your primary Cardiologist (physician) and Advanced Practice Providers (APPs -  Physician  Assistants and Nurse Practitioners) who all work together to provide you with the care you need, when you need it.     Your next appointment:   12 month(s)  The format for your next appointment:   In Person  Provider:   Bryan Lemma, MD       Marykay Lex, MD, MS Bryan Lemma, M.D., M.S. Interventional Cardiologist  [facility]  Pager # [phone] Phone # [phone] [address] [place], [place] [address]   Thank you for choosing [facility] [place]!!

## 2022-05-08 NOTE — Patient Instructions (Signed)
Medication Instructions:  No changes  *If you need a refill on your cardiac medications before your next appointment, please call your pharmacy*   Lab Work: Not needed  Please is have  your primary  draw labs for LIPID in June when you have the rest of your labs done.  Testing/Procedures:  Not needed  Follow-Up: At [facility], you and your health needs are our priority.  As part of our continuing mission to provide you with exceptional heart care, we have created designated Provider Care Teams.  These Care Teams include your primary Cardiologist (physician) and Advanced Practice Providers (APPs -  Physician Assistants and Nurse Practitioners) who all work together to provide you with the care you need, when you need it.     Your next appointment:   12 month(s)  The format for your next appointment:   In Person  Provider:   Bryan Lemma, MD

## 2022-05-10 ENCOUNTER — Telehealth: Payer: Self-pay | Admitting: Cardiology

## 2022-05-10 NOTE — Telephone Encounter (Signed)
Pt c/o medication issue:  1. Name of Medication:   New fluid pill  2. How are you currently taking this medication (dosage and times per day)?  Not taking yet  3. Are you having a reaction (difficulty breathing--STAT)?   4. What is your medication issue?   Patient stated she was prescribed new fluid pill medication and the prescription was not sent to her pharmacy - [facility] [phone] - [place], [place] - [address]

## 2022-05-10 NOTE — Telephone Encounter (Signed)
Please advise what medication. I see in the past Furosemide 20mg  and note in her chart regarding this, but she states it was new.

## 2022-05-11 MED ORDER — FUROSEMIDE 20 MG PO TABS: ORAL_TABLET | ORAL | 6 refills | Status: DC

## 2022-05-11 NOTE — Telephone Encounter (Signed)
Per Dr Herbie Baltimore ,  can refill Furosemide  20 mg to take daily as needed for swelling

## 2022-05-20 ENCOUNTER — Encounter: Payer: Self-pay | Admitting: Cardiology

## 2022-05-20 NOTE — Assessment & Plan Note (Signed)
Has not had labs checked since last February.  Probably needs to have it checked by PCP soon.  She remains on 10 mg rosuvastatin  As of February 2023, LDL was 27.  Triglycerides are little elevated but that could be related to glycemic control.

## 2022-05-20 NOTE — Assessment & Plan Note (Signed)
Likely, she is maintaining sinus rhythm now remains on carvedilol 25 mg twice daily and diltiazem 360 mg daily.  Not on true antiarrhythmic agent.  Tolerating Eliquis 5 mg twice daily with no bleeding issues.

## 2022-05-20 NOTE — Assessment & Plan Note (Signed)
This patients CHA2DS2-VASc Score and unadjusted Ischemic Stroke Rate (% per year) is equal to 7.2 % stroke rate/year from a score of 5  Above score calculated as 1 point each if present [CHF, HTN, DM, Vascular=MI/PAD/Aortic Plaque, Age if 65-74, or Female] Above score calculated as 2 points each if present [Age > 75, or Stroke/TIA/TE]  Appropriately on Eliquis 5 mg twice daily.  No bleeding issues. Okay to hold Eliquis for procedures or surgeries: for very low risk procedures 24 hours of ductal, for most standard procedures 48 hours, or high risk procedure 72 hours.

## 2022-05-20 NOTE — Assessment & Plan Note (Addendum)
BP was better on recheck.  She told me she had just taken her medications before she left and was rushing to get here.  On reassessment looks much better on current dose of diltiazem, carvedilol, amlodipine and lisinopril and HCTZ.  She is also on Minipress.  For diabetes she is currently on Jardiance, Trulicity and metformin-per her recommendations, she would like to discuss with her PCP potentially switching from Trulicity to either [place] or Ozempic that both have a better indication for weight loss as well.  Defer to PCP

## 2022-05-20 NOTE — Assessment & Plan Note (Signed)
Echo shows a moderate septal LVH but normal valves.  I suspect that her murmur could be related to either aortic sclerosis or mild LVOT gradient.  Regardless, nothing significant.  Continue to monitor.

## 2022-05-20 NOTE — Assessment & Plan Note (Addendum)
She has decided that she wants to make concerted effort to lose weight with diet change and exercise.  Unfortunately she has gained weight and she feels bad about it.  As such, she has plans to make adjustments especially over the summer when the weather is better to get back and exercising.

## 2022-05-23 ENCOUNTER — Ambulatory Visit
Admission: RE | Admit: 2022-05-23 | Discharge: 2022-05-23 | Disposition: A | Discharge status: Home / Self Care | Payer: Medicare HMO | Source: Ambulatory Visit | Attending: Family Medicine | Admitting: Family Medicine

## 2022-05-23 DIAGNOSIS — Z1231 Encounter for screening mammogram for malignant neoplasm of breast: Secondary | ICD-10-CM

## 2022-06-04 ENCOUNTER — Other Ambulatory Visit: Payer: Self-pay | Admitting: Family Medicine

## 2022-06-04 DIAGNOSIS — J4489 Other specified chronic obstructive pulmonary disease: Secondary | ICD-10-CM

## 2022-06-20 ENCOUNTER — Ambulatory Visit (INDEPENDENT_AMBULATORY_CARE_PROVIDER_SITE_OTHER): Payer: Medicare HMO | Admitting: Family Medicine

## 2022-06-20 ENCOUNTER — Encounter: Payer: Self-pay | Admitting: Family Medicine

## 2022-06-20 VITALS — BP 132/60 | HR 107 | Temp 98.7°F | Ht 64.0 in | Wt 232.6 lb

## 2022-06-20 DIAGNOSIS — Z0000 Encounter for general adult medical examination without abnormal findings: Secondary | ICD-10-CM | POA: Diagnosis not present

## 2022-06-20 DIAGNOSIS — Z79899 Other long term (current) drug therapy: Secondary | ICD-10-CM

## 2022-06-20 DIAGNOSIS — E785 Hyperlipidemia, unspecified: Secondary | ICD-10-CM

## 2022-06-20 DIAGNOSIS — E1159 Type 2 diabetes mellitus with other circulatory complications: Secondary | ICD-10-CM | POA: Diagnosis not present

## 2022-06-20 DIAGNOSIS — Z7985 Long-term (current) use of injectable non-insulin antidiabetic drugs: Secondary | ICD-10-CM

## 2022-06-20 DIAGNOSIS — D472 Monoclonal gammopathy: Secondary | ICD-10-CM

## 2022-06-20 DIAGNOSIS — Z6841 Body Mass Index (BMI) 40.0 and over, adult: Secondary | ICD-10-CM

## 2022-06-20 DIAGNOSIS — J4489 Other specified chronic obstructive pulmonary disease: Secondary | ICD-10-CM

## 2022-06-20 DIAGNOSIS — Z7984 Long term (current) use of oral hypoglycemic drugs: Secondary | ICD-10-CM

## 2022-06-20 DIAGNOSIS — F339 Major depressive disorder, recurrent, unspecified: Secondary | ICD-10-CM

## 2022-06-20 DIAGNOSIS — E1169 Type 2 diabetes mellitus with other specified complication: Secondary | ICD-10-CM | POA: Diagnosis not present

## 2022-06-20 DIAGNOSIS — D6869 Other thrombophilia: Secondary | ICD-10-CM

## 2022-06-20 DIAGNOSIS — G4733 Obstructive sleep apnea (adult) (pediatric): Secondary | ICD-10-CM | POA: Insufficient documentation

## 2022-06-20 DIAGNOSIS — I152 Hypertension secondary to endocrine disorders: Secondary | ICD-10-CM

## 2022-06-20 DIAGNOSIS — I4819 Other persistent atrial fibrillation: Secondary | ICD-10-CM

## 2022-06-20 DIAGNOSIS — K7469 Other cirrhosis of liver: Secondary | ICD-10-CM

## 2022-06-20 DIAGNOSIS — F1120 Opioid dependence, uncomplicated: Secondary | ICD-10-CM

## 2022-06-20 DIAGNOSIS — S90212A Contusion of left great toe with damage to nail, initial encounter: Secondary | ICD-10-CM | POA: Diagnosis not present

## 2022-06-20 DIAGNOSIS — E119 Type 2 diabetes mellitus without complications: Secondary | ICD-10-CM

## 2022-06-20 DIAGNOSIS — J418 Mixed simple and mucopurulent chronic bronchitis: Secondary | ICD-10-CM

## 2022-06-20 DIAGNOSIS — F5101 Primary insomnia: Secondary | ICD-10-CM

## 2022-06-20 DIAGNOSIS — G4730 Sleep apnea, unspecified: Secondary | ICD-10-CM

## 2022-06-20 DIAGNOSIS — S99929A Unspecified injury of unspecified foot, initial encounter: Secondary | ICD-10-CM

## 2022-06-20 LAB — CBC WITH DIFFERENTIAL/PLATELET
Basophils Absolute: 0 10*3/uL (ref 0.0–0.1)
Basophils Relative: 0.7 % (ref 0.0–3.0)
Eosinophils Absolute: 0.2 10*3/uL (ref 0.0–0.7)
Eosinophils Relative: 3 % (ref 0.0–5.0)
HCT: 43.1 % (ref 36.0–46.0)
Hemoglobin: 13.6 g/dL (ref 12.0–15.0)
Lymphocytes Relative: 17.9 % (ref 12.0–46.0)
Lymphs Abs: 1.2 10*3/uL (ref 0.7–4.0)
MCHC: 31.5 g/dL (ref 30.0–36.0)
MCV: 78.4 fl (ref 78.0–100.0)
Monocytes Absolute: 0.5 10*3/uL (ref 0.1–1.0)
Monocytes Relative: 7.7 % (ref 3.0–12.0)
Neutro Abs: 4.6 10*3/uL (ref 1.4–7.7)
Neutrophils Relative %: 70.7 % (ref 43.0–77.0)
Platelets: 242 10*3/uL (ref 150.0–400.0)
RBC: 5.5 Mil/uL — ABNORMAL HIGH (ref 3.87–5.11)
RDW: 14.9 % (ref 11.5–15.5)
WBC: 6.5 10*3/uL (ref 4.0–10.5)

## 2022-06-20 LAB — MICROALBUMIN / CREATININE URINE RATIO
Creatinine,U: 43.9 mg/dL
Microalb Creat Ratio: 1.6 mg/g (ref 0.0–30.0)
Microalb, Ur: <0.7 mg/dL (ref 0.0–1.9)

## 2022-06-20 LAB — COMPREHENSIVE METABOLIC PANEL
ALT: 23 U/L (ref 0–35)
AST: 28 U/L (ref 0–37)
Albumin: 4.3 g/dL (ref 3.5–5.2)
Alkaline Phosphatase: 95 U/L (ref 39–117)
BUN: 10 mg/dL (ref 6–23)
CO2: 24 mEq/L (ref 19–32)
Calcium: 9.8 mg/dL (ref 8.4–10.5)
Chloride: 106 mEq/L (ref 96–112)
Creatinine, Ser: 0.88 mg/dL (ref 0.40–1.20)
GFR: 68.24 mL/min (ref >60.00)
Glucose, Bld: 116 mg/dL — ABNORMAL HIGH (ref 70–99)
Potassium: 3.7 mEq/L (ref 3.5–5.1)
Sodium: 146 mEq/L — ABNORMAL HIGH (ref 135–145)
Total Bilirubin: 0.4 mg/dL (ref 0.2–1.2)
Total Protein: 8.2 g/dL (ref 6.0–8.3)

## 2022-06-20 LAB — LIPID PANEL
Cholesterol: 108 mg/dL (ref 0–200)
HDL: 44.3 mg/dL (ref >39.00)
LDL Cholesterol: 42 mg/dL (ref 0–99)
NonHDL: 64.03
Total CHOL/HDL Ratio: 2
Triglycerides: 111 mg/dL (ref 0.0–149.0)
VLDL: 22.2 mg/dL (ref 0.0–40.0)

## 2022-06-20 LAB — TSH: TSH: 4.66 u[IU]/mL (ref 0.35–5.50)

## 2022-06-20 LAB — HEMOGLOBIN A1C: Hgb A1c MFr Bld: 6.6 % — ABNORMAL HIGH (ref 4.6–6.5)

## 2022-06-20 MED ORDER — CEPHALEXIN 500 MG PO CAPS: 500.0000 mg | ORAL_CAPSULE | Freq: Two times a day (BID) | ORAL | 0 refills | Status: DC

## 2022-06-20 MED ORDER — TIRZEPATIDE 5 MG/0.5ML ~~LOC~~ SOAJ: 5.0000 mg | SUBCUTANEOUS | 5 refills | Status: DC

## 2022-06-20 NOTE — Progress Notes (Signed)
\\ Subjective  Chief Complaint  Patient presents with   Annual Exam    Pt here for Annual Exam and is currently fasting     HPI: Stacy Campbell is a 67 y.o. female who presents to [facility] [place] today for a Female Wellness Visit. She also has the concerns and/or needs as listed above in the chief complaint. These will be addressed in addition to the Health Maintenance Visit.   Wellness Visit: annual visit with health maintenance review and exam without Pap  HM: screens all current. Polypharmacy; uses a pill box to keep track of meds. Has been busy: more walking and on her feet due to family graduation gatherings etc. Imms current Chronic disease f/u and/or acute problem visit: (deemed necessary to be done in addition to the wellness visit): Multiple med chronic problems as listed below.  Problem list reviewed and updated.  She sees multiple medical specialist.  Reviewed cardiology notes.  In summary, new problem: Sleep apnea to see neurologist for follow-up.  Diagnosed a month ago.  Does not feel any different.  Still with severe insomnia.  On chronic Ambien. Diabetes, hypertension and hyperlipidemia have all been well-controlled on current medications as listed below.  No symptoms of hypertension, chest pain, hyperglycemia. Stopped her left great toe last week, still bleeding and not healing well.  Request referral.  Toenail may have been affected. Major depression per psychiatry.  Multiple mood medicines reviewed. 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  She denies problems with any of these concerns currently.  Assessment  1. Annual physical exam   2. Controlled type 2 diabetes mellitus without complication, without long-term current use of insulin (HCC)   3. Hyperlipidemia associated with type 2 diabetes mellitus (HCC)   4. Hypertension associated with diabetes (HCC)   5. Primary insomnia   6. Major depression,  recurrent, chronic (HCC)   7. Monoclonal gammopathy of unknown significance (MGUS)   8. Morbid obesity with BMI of 40.0-44.9, adult (HCC)   9. Other cirrhosis of liver (HCC)   10. Asthma with COPD (HCC)   11. Persistent atrial fibrillation (HCC)   12. Secondary hypercoagulable state (HCC)   13. Mixed simple and mucopurulent chronic bronchitis (HCC)   14. Chronic narcotic dependence (HCC)   15. Polypharmacy      Plan  Female Wellness Visit: Age appropriate Health Maintenance and Prevention measures were discussed with patient. Included topics are cancer screening recommendations, ways to keep healthy (see AVS) including dietary and exercise recommendations, regular eye and dental care, use of seat belts, and avoidance of moderate alcohol use and tobacco use.  BMI: discussed patient's BMI and encouraged positive lifestyle modifications to help get to or maintain a target BMI. HM needs and immunizations were addressed and ordered. See below for orders. See HM and immunization section for updates. Routine labs and screening tests ordered including cmp, cbc and lipids where appropriate. Discussed recommendations regarding Vit D and calcium supplementation (see AVS)  Chronic disease management visit and/or acute problem visit: Toenail injury and infection: Keflex twice daily and referred to podiatry.  Wound care discussed, high risk due to diabetes Cardiology: A-fib on Eliquis.  Rate controlled on beta-blocker.  Heart murmur most consistent with aortic sclerosis or ejection murmur per cardiology.  Blood pressure this medical condition is well controlled. There are no signs of complications, medication side effects, or red flags. Patient is instructed to continue the current treatment plan  without change in therapies or medications.  On multiple medications.  See below. Diabetes: Recheck for control today.  Would like better weight loss we will try to get [facility] covered.  Ordered today.  Would stop  Trulicity.  Start at 5 mg weekly.  Continue other diabetic medications including metformin Jardiance Continue statin for hyperlipidemia and recheck levels today. Psychiatric medications per psychiatry Chronic narcotics due to back pain from [facility] Asthma and COPD per pulmonology. Sleep apnea per neurology.  Follow-up tomorrow. Chronic urticaria and eczema per dermatology.  I reviewed all medications  Outpatient Encounter Medications as of 06/20/2022  Medication Sig Note   albuterol (VENTOLIN HFA) 108 (90 Base) MCG/ACT inhaler INHALE 2 PUFFS INTO THE LUNGS EVERY 6 HOURS AS NEEDED FOR WHEEZING OR SHORTNESS OF BREATH    amLODipine (NORVASC) 10 MG tablet Take 10 mg by mouth daily.    apixaban (ELIQUIS) 5 MG TABS tablet TAKE 1 TABLET(5 MG) BY MOUTH TWICE DAILY    ARIPiprazole (ABILIFY) 5 MG tablet Take 1 tablet (5 mg total) by mouth daily.    ARNUITY ELLIPTA 100 MCG/ACT AEPB INHALE 1 PUFF INTO THE LUNGS DAILY    busPIRone (BUSPAR) 10 MG tablet Take 10 mg by mouth 3 (three) times daily.    carvedilol (COREG) 25 MG tablet TAKE 1 TABLET(25 MG) BY MOUTH TWICE DAILY WITH A MEAL    cephALEXin (KEFLEX) 500 MG capsule Take 1 capsule (500 mg total) by mouth 2 (two) times daily.    cetirizine (ZYRTEC) 10 MG tablet Take by mouth.    desvenlafaxine (PRISTIQ) 50 MG 24 hr tablet Take 50 mg by mouth daily.    diltiazem (CARDIZEM CD) 360 MG 24 hr capsule Take 1 capsule (360 mg total) by mouth daily.    doxepin (SINEQUAN) 25 MG capsule Take 25 mg by mouth 3 (three) times daily as needed.    empagliflozin (JARDIANCE) 10 MG TABS tablet TAKE 1 TABLET(10 MG) BY MOUTH DAILY    EPINEPHrine 0.3 mg/0.3 mL IJ SOAJ injection Use as directed for severe allergic reaction    furosemide (LASIX) 20 MG tablet Take 1 tablet by mouth daily as needed for weight gain/swelling    gabapentin (NEURONTIN) 300 MG capsule TAKE 1 CAPSULE(300 MG) BY MOUTH AT BEDTIME    glucose blood test strip Test blood sugar daily and  record on blood sugar log    Lancets (ACCU-CHEK SOFT TOUCH) lancets Check blood sugar daily and record in log    lisinopril-hydrochlorothiazide (ZESTORETIC) 20-25 MG tablet Take 1 tablet by mouth daily.    metFORMIN (GLUCOPHAGE-XR) 500 MG 24 hr tablet Take 1 tablet (500 mg total) by mouth daily with breakfast.    mycophenolate (CELLCEPT) 500 MG tablet Take 2 tablets by mouth 2 (two) times daily.    nystatin cream (MYCOSTATIN) APPLY TOPICALLY TO THE AFFECTED AREA TWICE DAILY FOR 7 DAYS THEN AS NEEDED    olopatadine (PATANOL) 0.1 % ophthalmic solution Place 1 drop into both eyes 2 (two) times daily.    oxyCODONE-acetaminophen (PERCOCET) 10-325 MG tablet Take 1 tablet by mouth 5 (five) times daily as needed.    potassium chloride (KLOR-CON) 10 MEQ tablet Take 1 tablet by mouth daily for 3 days then only when lasix taken    prazosin (MINIPRESS) 2 MG capsule Take 2 mg by mouth at bedtime.    rosuvastatin (CRESTOR) 10 MG tablet TAKE 1 TABLET(10 MG) BY MOUTH DAILY    tacrolimus (PROTOPIC) 0.1 % ointment Apply 1 Application topically 2 (  two) times daily.    tirzepatide [facility]) 5 MG/0.5ML Pen Inject 5 mg into the skin once a week.    tiZANidine (ZANAFLEX) 4 MG tablet Take 4 mg by mouth 2 (two) times daily.    triamcinolone ointment (KENALOG) 0.1 % Apply 1 application topically 2 (two) times daily. 09/27/2021: prn   zolpidem (AMBIEN CR) 12.5 MG CR tablet Take 12.5 mg by mouth at bedtime.    [DISCONTINUED] Dulaglutide (TRULICITY) 0.75 MG/0.5ML SOPN ADMINISTER 0.75 MG UNDER THE SKIN 1 TIME A WEEK    [DISCONTINUED] meclizine (ANTIVERT) 25 MG tablet Take 1 tablet (25 mg total) by mouth 3 (three) times daily as needed for dizziness. (Patient not taking: Reported on 06/20/2022)    No facility-administered encounter medications on file as of 06/20/2022.    Follow up: 6 months to recheck Orders Placed This Encounter  Procedures   CBC with Differential/Platelet   Comprehensive metabolic panel   Lipid panel    TSH   Hemoglobin A1c   Microalbumin / creatinine urine ratio   No orders of the defined types were placed in this encounter.     Body mass index is 39.93 kg/m. Wt Readings from Last 3 Encounters:  06/20/22 232 lb 9.6 oz (105.5 kg)  05/08/22 236 lb (107 kg)  12/15/21 232 lb (105.2 kg)     Patient Active Problem List   Diagnosis Date Noted   Persistent atrial fibrillation (HCC) 10/04/2021    Priority: High   Secondary hypercoagulable state (HCC) 10/04/2021    Priority: High   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 01/04/2020    Priority: High   Monoclonal gammopathy of unknown significance (MGUS) 01/23/2019    Priority: High   Controlled type 2 diabetes mellitus without complication, without long-term current use of insulin (HCC) 12/12/2018    Priority: High    Diagnosed 2020    Hyperlipidemia associated with type 2 diabetes mellitus (HCC) 12/12/2018    Priority: High    LDL goal of 70 given diabetes    Asthma with COPD (HCC) 05/25/2013    Priority: High   Morbid obesity with BMI of 40.0-44.9, adult (HCC)     Priority: High   Major depression, recurrent, chronic (HCC) 03/07/2006    Priority: High    Sees psychiatry: Horatio Pel, FNP; [facility] Psychotherapy with Spero Geralds, PsyD    Hypertension associated with diabetes (HCC) 03/07/2006    Priority: High   Chronic low back pain 03/07/2006    Priority: High   Insomnia 03/07/2006    Priority: High    On chronic Ambien (started by previous provider).     Aortic ejection murmur 12/06/2021    Priority: Medium    DJD (degenerative joint disease) of cervical spine 09/06/2021    Priority: Medium    Chronic urticaria 06/12/2021    Priority: Medium     Has had chronic pred use for years/eczema    Degenerative joint disease (DJD) of lumbar spine 01/04/2020    Priority: Medium    History of hepatitis C 10/11/2017    Priority: Medium     S/P Treatment with sofosbuvir and simeprivir.  Followed by hep  clinic.  Undetectable viral load - Cured.    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   Health Maintenance  Topic Date Due   Diabetic kidney evaluation - Urine ACR  02/24/2022   HEMOGLOBIN A1C  06/16/2022   Medicare Annual Wellness (AWV)  07/29/2022   INFLUENZA VACCINE  08/09/2022   FOOT EXAM  10/07/2022   Diabetic kidney evaluation - eGFR measurement  12/16/2022   OPHTHALMOLOGY EXAM  04/16/2023   MAMMOGRAM  05/23/2023   DEXA SCAN  04/21/2024   Colonoscopy  12/14/2029   Pneumonia Vaccine 25+ Years old  Completed   Hepatitis C Screening  Completed   HPV VACCINES  Aged Out   DTaP/Tdap/Td  Discontinued   COVID-19 Vaccine  Discontinued   Zoster Vaccines- Shingrix  Discontinued   Immunization History  Administered Date(s) Administered   Fluad Quad(high Dose 65+) 11/18/2020, 10/06/2021   Hepatitis B, ADULT 10/12/2014, 11/09/2014, 04/11/2015   Influenza Split 11/14/2010, 10/09/2011   Influenza Whole 10/13/2007, 01/10/2009, 11/22/2009   Influenza,inj,Quad PF,6+ Mos 10/10/2012, 10/08/2013, 10/15/2014, 09/29/2015, 11/06/2016, 09/10/2017, 10/15/2018, 09/30/2019   PFIZER(Purple Top)SARS-COV-2 Vaccination 03/20/2019, 04/15/2019, 10/15/2019   PNEUMOCOCCAL CONJUGATE-20 11/18/2020   Pneumococcal Polysaccharide-23 01/10/2009   Td 01/09/1999, 01/10/2009   We updated and reviewed the patient's past history in detail and it is documented below. Allergies: Patient is allergic to hydrocodone, linalool,  methylisothiazolinone, propylene glycol, aspirin, egg-derived products, and hydrocodone-guaifenesin. Past Medical History Patient  has a past medical history of Angio-edema, Anxiety, Arthritis, Asthma, Bone spur, Chronic lower back pain, Cirrhosis of liver (HCC) (12/2018), Depression, Diabetes mellitus without complication (HCC), Eczema, GERD (gastroesophageal reflux disease), Hepatitis C, Hyperplastic polyp of intestine, Hypertension, Insomnia, OSA (obstructive sleep apnea), Osteoarthritis, Osteoporosis, Paroxysmal atrial fibrillation (HCC) (09/27/2021), Splenic artery aneurysm (HCC), and Urticaria. Past Surgical History Patient  has a past surgical history that includes Cystoscopy with retrograde pyelogram, ureteroscopy and stent placement (Left, 01/29/2017); Cardioversion (1990s); Cystoscopy w/ ureteral stent placement (Left, 01/29/2017); Cholecystectomy (N/A, 01/22/2018); Cardioversion (N/A, 10/18/2021); NM MYOVIEW LTD (10/28/2017); transthoracic echocardiogram (01/30/2017); and NM MYOVIEW LTD (11/06/2021). Family History: Patient family history includes Alcoholism in her brother; Asthma in her brother, brother, brother, brother, father, and mother; Cancer in her father; Diabetes in her brother, brother, brother, brother, father, and mother; Eczema in her father; Hepatitis B in her daughter. Social History:  Patient  reports that she quit smoking about 14 years ago. Her smoking use included cigarettes. She has a 9.25 pack-year smoking history. She has never used smokeless tobacco. She reports that she does not drink alcohol and does not use drugs.  Review of Systems: Constitutional: negative for fever or malaise Ophthalmic: negative for photophobia, double vision or loss of vision Cardiovascular: negative for chest pain, dyspnea on exertion, or new LE swelling Respiratory: negative for SOB or persistent cough Gastrointestinal: negative for abdominal pain, change in bowel habits or  melena Genitourinary: negative for dysuria or gross hematuria, no abnormal uterine bleeding or disharge Musculoskeletal: negative for new gait disturbance or muscular weakness Integumentary: negative for new or persistent rashes, no breast lumps Neurological: negative for TIA or stroke symptoms Psychiatric: negative for SI or delusions Allergic/Immunologic: negative for hives  Patient Care Team    Relationship Specialty Notifications Start End  Willow Ora, MD PCP - General Family Medicine  01/04/20   Marykay Lex, MD PCP - Cardiology  Cardiology  10/30/21   Willis Modena, MD Consulting Physician Gastroenterology  01/04/20   Jonathon Bellows, NP Nurse Practitioner Psychology  01/04/20   Erroll Luna, [facility]  Pharmacist  10/10/20     Objective  Vitals: BP 132/60 Comment: rechecked after resting  Pulse (!) 107   Temp 98.7 F (37.1 C)   Ht 5\' 4"  (1.626 m)   Wt 232 lb 9.6 oz (105.5 kg)   SpO2 97%   BMI 39.93 kg/m  General:  Well developed, well nourished, no acute distress  Psych:  Alert and orientedx3,normal mood and affect HEENT:  Normocephalic, atraumatic, non-icteric sclera,  supple neck without adenopathy, mass or thyromegaly Cardiovascular:  Normal S1, S2, RRR 2/6 murmur present  respiratory:  Good breath sounds bilaterally, CTAB with normal respiratory effort Gastrointestinal: normal bowel sounds, soft, non-tender, no noted masses. No HSM MSK: extremities without edema, joints without erythema or swelling Skin with eczematous patches throughout Left great toenail lifted with granulation tissue below and scabbing Neurologic:    Mental status is normal.  Gross motor and sensory exams are normal.  No tremor  Commons side effects, risks, benefits, and alternatives for medications and treatment plan prescribed today were discussed, and the patient expressed understanding of the given instructions. Patient is instructed to call or message via MyChart if he/she has  any questions or concerns regarding our treatment plan. No barriers to understanding were identified. We discussed Red Flag symptoms and signs in detail. Patient expressed understanding regarding what to do in case of urgent or emergency type symptoms.  Medication list was reconciled, printed and provided to the patient in AVS. Patient instructions and summary information was reviewed with the patient as documented in the AVS. This note was prepared with assistance of Dragon voice recognition software. Occasional wrong-word or sound-a-like substitutions may have occurred due to the inherent limitations of voice recognition software

## 2022-06-20 NOTE — Progress Notes (Signed)
PATIENT: Stacy Campbell Camey DOB: 12-24-1955  REASON FOR VISIT: follow up HISTORY FROM: patient PRIMARY NEUROLOGIST: Dr. Frances Furbish  Chief Complaint  Patient presents with   Follow-up    Pt in 4 Pt here for CPAP f/u Pt states not doing well with CPAP . Pt states doesn't feel any better Pt states she has chronic insomnia      HISTORY OF PRESENT ILLNESS: Today 06/21/22:  Stacy Campbell is a 67 y.o. female with a history of OSA on CPAP. Returns today for follow-up.  Her download is below.  She reports that she cannot really tell a difference since she began using the CPAP.  She still struggles with insomnia.  She does see psychiatry and has a therapist that she also follows up regularly with she does take Ambien every night.          REVIEW OF SYSTEMS: Out of a complete 14 system review of symptoms, the patient complains only of the following symptoms, and all other reviewed systems are negative.  FSS 55 ESS 1  ALLERGIES: Allergies  Allergen Reactions   Hydrocodone Itching   Linalool    Methylisothiazolinone Other (See Comments)    Other reaction(s): Other (See Comments) Positive patch test Positive patch test    Propylene Glycol    Aspirin Itching and Other (See Comments)    Lower dose doesn't make her itch (she takes it every day)   Egg-Derived Products Nausea Only and Other (See Comments)    No reaction if in another food   Hydrocodone-Guaifenesin Rash    HOME MEDICATIONS: Outpatient Medications Prior to Visit  Medication Sig Dispense Refill   albuterol (VENTOLIN HFA) 108 (90 Base) MCG/ACT inhaler INHALE 2 PUFFS INTO THE LUNGS EVERY 6 HOURS AS NEEDED FOR WHEEZING OR SHORTNESS OF BREATH 54 g 1   amLODipine (NORVASC) 10 MG tablet Take 10 mg by mouth daily.     apixaban (ELIQUIS) 5 MG TABS tablet TAKE 1 TABLET(5 MG) BY MOUTH TWICE DAILY 180 tablet 1   ARIPiprazole (ABILIFY) 5 MG tablet Take 1 tablet (5 mg total) by mouth daily. 90 tablet 0   ARNUITY  ELLIPTA 100 MCG/ACT AEPB INHALE 1 PUFF INTO THE LUNGS DAILY 30 each 11   busPIRone (BUSPAR) 10 MG tablet Take 10 mg by mouth 3 (three) times daily.     carvedilol (COREG) 25 MG tablet TAKE 1 TABLET(25 MG) BY MOUTH TWICE DAILY WITH A MEAL 180 tablet 3   cephALEXin (KEFLEX) 500 MG capsule Take 1 capsule (500 mg total) by mouth 2 (two) times daily. 14 capsule 0   cetirizine (ZYRTEC) 10 MG tablet Take by mouth.     desvenlafaxine (PRISTIQ) 50 MG 24 hr tablet Take 50 mg by mouth daily.     diltiazem (CARDIZEM CD) 360 MG 24 hr capsule Take 1 capsule (360 mg total) by mouth daily. 90 capsule 3   doxepin (SINEQUAN) 25 MG capsule Take 25 mg by mouth 3 (three) times daily as needed.     empagliflozin (JARDIANCE) 10 MG TABS tablet TAKE 1 TABLET(10 MG) BY MOUTH DAILY 90 tablet 3   EPINEPHrine 0.3 mg/0.3 mL IJ SOAJ injection Use as directed for severe allergic reaction 2 Device 2   furosemide (LASIX) 20 MG tablet Take 1 tablet by mouth daily as needed for weight gain/swelling 20 tablet 6   gabapentin (NEURONTIN) 300 MG capsule TAKE 1 CAPSULE(300 MG) BY MOUTH AT BEDTIME 90 capsule 1   glucose blood test strip Test blood  sugar daily and record on blood sugar log 100 each 12   Lancets (ACCU-CHEK SOFT TOUCH) lancets Check blood sugar daily and record in log 100 each 12   lisinopril-hydrochlorothiazide (ZESTORETIC) 20-25 MG tablet Take 1 tablet by mouth daily. 90 tablet 3   metFORMIN (GLUCOPHAGE-XR) 500 MG 24 hr tablet Take 1 tablet (500 mg total) by mouth daily with breakfast. 90 tablet 3   mycophenolate (CELLCEPT) 500 MG tablet Take 2 tablets by mouth 2 (two) times daily.     nystatin cream (MYCOSTATIN) APPLY TOPICALLY TO THE AFFECTED AREA TWICE DAILY FOR 7 DAYS THEN AS NEEDED 30 g 0   olopatadine (PATANOL) 0.1 % ophthalmic solution Place 1 drop into both eyes 2 (two) times daily. 5 mL 5   oxyCODONE-acetaminophen (PERCOCET) 10-325 MG tablet Take 1 tablet by mouth 5 (five) times daily as needed.     potassium  chloride (KLOR-CON) 10 MEQ tablet Take 1 tablet by mouth daily for 3 days then only when lasix taken 10 tablet 0   prazosin (MINIPRESS) 2 MG capsule Take 2 mg by mouth at bedtime.     rosuvastatin (CRESTOR) 10 MG tablet TAKE 1 TABLET(10 MG) BY MOUTH DAILY 90 tablet 3   tacrolimus (PROTOPIC) 0.1 % ointment Apply 1 Application topically 2 (two) times daily.     tirzepatide [facility]) 5 MG/0.5ML Pen Inject 5 mg into the skin once a week. 6 mL 5   tiZANidine (ZANAFLEX) 4 MG tablet Take 4 mg by mouth 2 (two) times daily.     triamcinolone ointment (KENALOG) 0.1 % Apply 1 application topically 2 (two) times daily.     zolpidem (AMBIEN CR) 12.5 MG CR tablet Take 12.5 mg by mouth at bedtime.     No facility-administered medications prior to visit.    PAST MEDICAL HISTORY: Past Medical History:  Diagnosis Date   Angio-edema    Anxiety    Arthritis    "lower back" (01/29/2017)   Asthma    Bone spur    spine   Chronic lower back pain    Cirrhosis of liver (HCC) 12/2018   Depression    Diabetes mellitus without complication (HCC)    Eczema    GERD (gastroesophageal reflux disease)    Hepatitis C    was treated for this   Hyperplastic polyp of intestine    Hypertension    Insomnia    OSA (obstructive sleep apnea)    Pending formal sleep study   Osteoarthritis    Osteoporosis    Paroxysmal atrial fibrillation (HCC) 09/27/2021   cardioversion in 1990s; Noted to be in Afib-RVR when being evaluated by sleep medicine.   Splenic artery aneurysm (HCC)    Urticaria     PAST SURGICAL HISTORY: Past Surgical History:  Procedure Laterality Date   CARDIOVERSION  1990s   CARDIOVERSION N/A 10/18/2021   Procedure: CARDIOVERSION;  Surgeon: Jake Bathe, MD;  Location: [facility];  Service: Cardiovascular;  Laterality: N/A;   CHOLECYSTECTOMY N/A 01/22/2018   Procedure: LAPAROSCOPIC CHOLECYSTECTOMY ERAS PATHWAY;  Surgeon: Berna Bue, MD;  Location: [facility];  Service: General;  Laterality:  N/A;   CYSTOSCOPY W/ URETERAL STENT PLACEMENT Left 01/29/2017   Procedure: CYSTOSCOPY WITH RETROGRADE PYELOGRAM/URETERAL STENT PLACEMENT;  Surgeon: Crist Fat, MD;  Location[facility] OR;  Service: Urology;  Laterality: Left;   CYSTOSCOPY WITH RETROGRADE PYELOGRAM, URETEROSCOPY AND STENT PLACEMENT Left 01/29/2017   CYSTOSCOPY WITH RETROGRADE PYELOGRAM/URETERAL STENT PLACEMENT   NM MYOVIEW LTD  10/28/2017   Nuclear  stress EF: 63%. The left ventricular ejection fraction is normal (55-65%). There was no ST segment deviation noted during stress. The study is normal.  This is a low risk study.   NM MYOVIEW LTD  11/06/2021   ?  Myocardial infarction located in the anterior region? ->  Despite this, there is a EF of 71% with no RWMA.  READ AS LOW RISK/LOW RISK; in fact, on review of images the anterior defect appeared to be consistent with breast attenuation.  No evidence of ischemia or infarction.  Preserved EF 71%.   TRANSTHORACIC ECHOCARDIOGRAM  01/30/2017   Normal LV size and function-hyperdynamic/vigorous.  EF 65 to 70%. No RWMA.  Focal basal hypertrophy - Minimal LVOT gradient (2.6 m/s).  GRII DD.  Calcified AoV with no stenosis.  Mild MAC.  Trivial TR.  Peak PAP 55 mmHg.    FAMILY HISTORY: Family History  Problem Relation Age of Onset   Asthma Mother    Diabetes Mother    Asthma Father    Diabetes Father    Cancer Father        prostate   Eczema Father    Alcoholism Brother    Asthma Brother    Diabetes Brother    Asthma Brother    Diabetes Brother    Asthma Brother    Diabetes Brother    Asthma Brother    Diabetes Brother    Hepatitis B Daughter    Breast cancer Neg Hx    Sleep apnea Neg Hx     SOCIAL HISTORY: Social History   Socioeconomic History   Marital status: Widowed    Spouse name: Not on file   Number of children: Not on file   Years of education: Not on file   Highest education level: Not on file  Occupational History   Not on file  Tobacco Use   Smoking  status: Former    Packs/day: 0.25    Years: 37.00    Additional pack years: 0.00    Total pack years: 9.25    Types: Cigarettes    Quit date: 03/30/2008    Years since quitting: 14.2   Smokeless tobacco: Never   Tobacco comments:    Former smoker 10/04/21  Vaping Use   Vaping Use: Never used  Substance and Sexual Activity   Alcohol use: No   Drug use: Never   Sexual activity: Not Currently    Birth control/protection: Post-menopausal  Other Topics Concern   Not on file  Social History Narrative   Caffiene none. Soda 2 daily.   Education: 12 th grade.Working Development worker, community.   Children 6, grandkids 18.       Social Determinants of Health   Financial Resource Strain: Low Risk  (07/28/2021)   Overall Financial Resource Strain (CARDIA)    Difficulty of Paying Living Expenses: Not hard at all  Food Insecurity: No Food Insecurity (07/28/2021)   Hunger Vital Sign    Worried About Running Out of Food in the Last Year: Never true    Ran Out of Food in the Last Year: Never true  Transportation Needs: No Transportation Needs (07/28/2021)   PRAPARE - Administrator, Civil Service (Medical): No    Lack of Transportation (Non-Medical): No  Physical Activity: Insufficiently Active (07/28/2021)   Exercise Vital Sign    Days of Exercise per Week: 2 days    Minutes of Exercise per Session: 50 min  Stress: Stress Concern Present (07/28/2021)   [employer] of Occupational  Health - Occupational Stress Questionnaire    Feeling of Stress : To some extent  Social Connections: Moderately Isolated (07/28/2021)   Social Connection and Isolation Panel [NHANES]    Frequency of Communication with Friends and Family: More than three times a week    Frequency of Social Gatherings with Friends and Family: Twice a week    Attends Religious Services: More than 4 times per year    Active Member of [employer] or Organizations: No    Attends Banker Meetings: Never    Marital Status:  Widowed  Intimate Partner Violence: Not At Risk (07/28/2021)   Humiliation, Afraid, Rape, and Kick questionnaire    Fear of Current or Ex-Partner: No    Emotionally Abused: No    Physically Abused: No    Sexually Abused: No      PHYSICAL EXAM  Vitals:   06/21/22 0930  BP: (!) 143/80  Pulse: 93  Weight: 231 lb (104.8 kg)  Height: 5\' 4"  (1.626 m)   Body mass index is 39.65 kg/m.  Generalized: Well developed, in no acute distress  Chest: Lungs clear to auscultation bilaterally  Neurological examination  Mentation: Alert oriented to time, place, history taking. Follows all commands speech and language fluent Cranial nerve II-XII: Extraocular movements were full, visual field were full on confrontational test Head turning and shoulder shrug  were normal and symmetric. Motor: The motor testing reveals 5 over 5 strength of all 4 extremities. Good symmetric motor tone is noted throughout.  Sensory: Sensory testing is intact to soft touch on all 4 extremities. No evidence of extinction is noted.  Gait and station: Gait is normal.    DIAGNOSTIC DATA (LABS, IMAGING, TESTING) - I reviewed patient records, labs, notes, testing and imaging myself where available.  Lab Results  Component Value Date   WBC 6.5 06/20/2022   HGB 13.6 06/20/2022   HCT 43.1 06/20/2022   MCV 78.4 06/20/2022   PLT 242.0 06/20/2022      Component Value Date/Time   NA 146 (H) 06/20/2022 1053   NA 141 11/24/2018 1509   K 3.7 06/20/2022 1053   CL 106 06/20/2022 1053   CO2 24 06/20/2022 1053   GLUCOSE 116 (H) 06/20/2022 1053   BUN 10 06/20/2022 1053   BUN 17 11/24/2018 1509   CREATININE 0.88 06/20/2022 1053   CREATININE 0.91 02/08/2016 1523   CALCIUM 9.8 06/20/2022 1053   PROT 8.2 06/20/2022 1053   PROT 7.4 11/24/2018 1509   ALBUMIN 4.3 06/20/2022 1053   ALBUMIN 4.2 11/24/2018 1509   AST 28 06/20/2022 1053   ALT 23 06/20/2022 1053   ALKPHOS 95 06/20/2022 1053   BILITOT 0.4 06/20/2022 1053    BILITOT <0.2 11/24/2018 1509   GFRNONAA >60 10/11/2021 1003   GFRNONAA 69 02/08/2016 1523   GFRAA 68 11/24/2018 1509   GFRAA 79 02/08/2016 1523   Lab Results  Component Value Date   CHOL 108 06/20/2022   HDL 44.30 06/20/2022   LDLCALC 42 06/20/2022   TRIG 111.0 06/20/2022   CHOLHDL 2 06/20/2022   Lab Results  Component Value Date   HGBA1C 6.6 (H) 06/20/2022   Lab Results  Component Value Date   VITAMINB12 602 08/04/2012   Lab Results  Component Value Date   TSH 4.66 06/20/2022      ASSESSMENT AND PLAN 67 y.o. year old female  has a past medical history of Angio-edema, Anxiety, Arthritis, Asthma, Bone spur, Chronic lower back pain, Cirrhosis of liver (HCC) (12/2018),  Depression, Diabetes mellitus without complication (HCC), Eczema, GERD (gastroesophageal reflux disease), Hepatitis C, Hyperplastic polyp of intestine, Hypertension, Insomnia, OSA (obstructive sleep apnea), Osteoarthritis, Osteoporosis, Paroxysmal atrial fibrillation (HCC) (09/27/2021), Splenic artery aneurysm (HCC), and Urticaria. here with:  OSA on CPAP  - CPAP compliance is good - Good treatment of AHI  - Encourage patient to use CPAP nightly and > 4 hours each night -I did encourage the patient that the longer she uses CPAP and the more consistent she is the more likely she we will begin to see the benefit. -Follow-up with psychiatry for insomnia - F/U in 6-7 months or sooner if needed   Butch Penny, MSN, NP-C 06/21/2022, 9:37 AM [facility] [address], [address] [place], [place] [address] [phone]

## 2022-06-21 ENCOUNTER — Ambulatory Visit (INDEPENDENT_AMBULATORY_CARE_PROVIDER_SITE_OTHER): Payer: Medicare HMO | Admitting: Adult Health

## 2022-06-21 ENCOUNTER — Encounter: Payer: Self-pay | Admitting: Adult Health

## 2022-06-21 VITALS — BP 143/80 | HR 93 | Ht 64.0 in | Wt 231.0 lb

## 2022-06-21 DIAGNOSIS — G4733 Obstructive sleep apnea (adult) (pediatric): Secondary | ICD-10-CM

## 2022-06-21 NOTE — Patient Instructions (Signed)
Continue using CPAP nightly and greater than 4 hours each night Mask refitting ordered Try to stay consistent with CPAP and use all night long.  If your symptoms worsen or you develop new symptoms please let us know.

## 2022-06-22 ENCOUNTER — Ambulatory Visit (INDEPENDENT_AMBULATORY_CARE_PROVIDER_SITE_OTHER): Payer: Medicare HMO | Admitting: Podiatry

## 2022-06-22 ENCOUNTER — Ambulatory Visit (INDEPENDENT_AMBULATORY_CARE_PROVIDER_SITE_OTHER): Payer: Medicare HMO

## 2022-06-22 DIAGNOSIS — S99922A Unspecified injury of left foot, initial encounter: Secondary | ICD-10-CM

## 2022-06-22 DIAGNOSIS — S90212A Contusion of left great toe with damage to nail, initial encounter: Secondary | ICD-10-CM | POA: Diagnosis not present

## 2022-06-24 DIAGNOSIS — S90212A Contusion of left great toe with damage to nail, initial encounter: Secondary | ICD-10-CM | POA: Insufficient documentation

## 2022-06-24 NOTE — Progress Notes (Signed)
Chief Complaint  Patient presents with   nail injury    Injury to left hallux toenail with the cement a couple of days ago. Yesterday patient hit her left hallux with the computer desk. Left hallux is swollen and patient has dry blood. Patient is diabetic and takes oral medication and insulin for diabetes control.     HPI: 67 y.o. female presenting today following injury to the left great toe which occurred within the past week.  States that the toe was struck against a desk.  She did have bleeding.  There is dried blood present on the toe today.  She has moderate discomfort to the area as well.  She was placed on cephalexin on 06/20/2022 for approximately 1 week.  Past Medical History:  Diagnosis Date   Angio-edema    Anxiety    Arthritis    "lower back" (01/29/2017)   Asthma    Bone spur    spine   Chronic lower back pain    Cirrhosis of liver (HCC) 12/2018   Depression    Diabetes mellitus without complication (HCC)    Eczema    GERD (gastroesophageal reflux disease)    Hepatitis C    was treated for this   Hyperplastic polyp of intestine    Hypertension    Insomnia    OSA (obstructive sleep apnea)    Pending formal sleep study   Osteoarthritis    Osteoporosis    Paroxysmal atrial fibrillation (HCC) 09/27/2021   cardioversion in 1990s; Noted to be in Afib-RVR when being evaluated by sleep medicine.   Splenic artery aneurysm (HCC)    Urticaria     Past Surgical History:  Procedure Laterality Date   CARDIOVERSION  1990s   CARDIOVERSION N/A 10/18/2021   Procedure: CARDIOVERSION;  Surgeon: Jake Bathe, MD;  Location: [facility];  Service: Cardiovascular;  Laterality: N/A;   CHOLECYSTECTOMY N/A 01/22/2018   Procedure: LAPAROSCOPIC CHOLECYSTECTOMY ERAS PATHWAY;  Surgeon: Berna Bue, MD;  Location: [facility];  Service: General;  Laterality: N/A;   CYSTOSCOPY W/ URETERAL STENT PLACEMENT Left 01/29/2017   Procedure: CYSTOSCOPY WITH RETROGRADE  PYELOGRAM/URETERAL STENT PLACEMENT;  Surgeon: Crist Fat, MD;  Location: [facility] OR;  Service: Urology;  Laterality: Left;   CYSTOSCOPY WITH RETROGRADE PYELOGRAM, URETEROSCOPY AND STENT PLACEMENT Left 01/29/2017   CYSTOSCOPY WITH RETROGRADE PYELOGRAM/URETERAL STENT PLACEMENT   NM MYOVIEW LTD  10/28/2017   Nuclear stress EF: 63%. The left ventricular ejection fraction is normal (55-65%). There was no ST segment deviation noted during stress. The study is normal.  This is a low risk study.   NM MYOVIEW LTD  11/06/2021   ?  Myocardial infarction located in the anterior region? ->  Despite this, there is a EF of 71% with no RWMA.  READ AS LOW RISK/LOW RISK; in fact, on review of images the anterior defect appeared to be consistent with breast attenuation.  No evidence of ischemia or infarction.  Preserved EF 71%.   TRANSTHORACIC ECHOCARDIOGRAM  01/30/2017   Normal LV size and function-hyperdynamic/vigorous.  EF 65 to 70%. No RWMA.  Focal basal hypertrophy - Minimal LVOT gradient (2.6 m/s).  GRII DD.  Calcified AoV with no stenosis.  Mild MAC.  Trivial TR.  Peak PAP 55 mmHg.    Allergies  Allergen Reactions   Hydrocodone Itching   Linalool    Methylisothiazolinone Other (See Comments)    Other reaction(s): Other (See Comments) Positive patch test Positive patch test  Propylene Glycol    Aspirin Itching and Other (See Comments)    Lower dose doesn't make her itch (she takes it every day)   Egg-Derived Products Nausea Only and Other (See Comments)    No reaction if in another food   Hydrocodone-Guaifenesin Rash     Physical Exam:  General: The patient is alert and oriented x3 in no acute distress.  Dermatology: Skin is warm, dry and supple bilateral lower extremities.  The left hallux nail is incurvated along the medial and lateral borders.  The medial border is cutting into the skin which is the source of the recent blood drainage to the area.  No evidence of hematoma subungually is  present.  There is pain on palpation to the toenail and the distal aspect of the toe.  There is a small cut in the skin at the distal medial aspect of the hallux from the toenail.  Vascular: Palpable pedal pulses bilaterally. Capillary refill within normal limits.  No appreciable edema.  No erythema or calor.  Neurological: Light touch sensation grossly intact bilateral feet.   Musculoskeletal Exam: No pedal deformities noted  Radiographic Exam (3 weightbearing views obtained left foot 06/22/2022):  Normal osseous mineralization. Joint spaces preserved.  No fractures or osseous irregularities noted.  Assessment/Plan of Care: 1. Contusion of left great toe with damage to nail, initial encounter     Discussed clinical findings with patient today.  With the patient's consent to the left hallux was anesthetized utilizing 3 cc of a mixture of 1% Xylocaine plain and 0.5% Sensorcaine plain.  Following confirmation of anesthesia to the toe the area was cleansed with soapy water solution.  After this was performed in a sterile prep to the toe had been performed, the left hallux medial nail border was avulsed proximal to the eponychium and removed in total.  The remaining portion of the nail was left intact as it was not lytic or cutting into the skin.  No purulence was noted following the procedure.  Antibiotic ointment and a dry sterile dressing was applied.  She will leave this on till tomorrow.  Reviewed daily instructions regarding Epsom salt soaks and use of antibiotic ointment and a Band-Aid or gauze dressing.  Continue with cephalexin as instructed by external provider.  Follow-up in approximately 2 weeks for recheck   Tauheedah Bok DBurna Mortimer, DPM, FACFAS [facility]     [address] [place], [place] [address]                Office [phone]  Fax [phone]

## 2022-07-03 ENCOUNTER — Ambulatory Visit (INDEPENDENT_AMBULATORY_CARE_PROVIDER_SITE_OTHER): Payer: Medicare HMO | Admitting: Podiatry

## 2022-07-03 ENCOUNTER — Encounter: Payer: Medicare HMO | Admitting: Adult Health

## 2022-07-03 ENCOUNTER — Encounter: Payer: Self-pay | Admitting: Podiatry

## 2022-07-03 DIAGNOSIS — S90212D Contusion of left great toe with damage to nail, subsequent encounter: Secondary | ICD-10-CM

## 2022-07-03 NOTE — Progress Notes (Signed)
See my chart note.

## 2022-07-03 NOTE — Progress Notes (Signed)
  Patient presents for recheck on left hallux contusion where the left hallux toenail, medial border, had to be removed on her last visit due to the injury with nail abnormality.  She notes that there is significantly less discomfort to the area at this time.  She states her swelling has gone down.  On exam there is less edema to the area and there is no active drainage or bleeding at this time.  The eschar has not completely finished forming along the medial nail margin of the left hallux.  There is minimal to no drainage at this time.  No clinical signs of infection are seen.  No surrounding erythema is noted.  Assessment: Healing area from left hallux medial nail border avulsion, secondary to toe contusion.  Plan: Patient can continue with dry Band-Aids during the day when wearing enclosed toe shoes but can leave the toe open to the air overnight to start forming the dry eschar.  Her pain should resolve once the dry scab forms.  She also noted she like to get on a regular schedule for routine footcare.  Will have the front desk schedule her within the next 2 weeks for nail trim

## 2022-07-17 ENCOUNTER — Ambulatory Visit (INDEPENDENT_AMBULATORY_CARE_PROVIDER_SITE_OTHER): Payer: Medicare HMO | Admitting: Podiatry

## 2022-07-17 ENCOUNTER — Encounter: Payer: Self-pay | Admitting: Podiatry

## 2022-07-17 VITALS — BP 137/70 | HR 86

## 2022-07-17 DIAGNOSIS — M79675 Pain in left toe(s): Secondary | ICD-10-CM | POA: Diagnosis not present

## 2022-07-17 DIAGNOSIS — E119 Type 2 diabetes mellitus without complications: Secondary | ICD-10-CM

## 2022-07-17 DIAGNOSIS — B351 Tinea unguium: Secondary | ICD-10-CM

## 2022-07-17 DIAGNOSIS — M79674 Pain in right toe(s): Secondary | ICD-10-CM

## 2022-07-17 NOTE — Progress Notes (Signed)
       Subjective:  Patient ID: Stacy Campbell, female    DOB: 02/18/1955,  MRN: [id number]   Stacy Campbell presents to clinic today for:  Chief Complaint  Patient presents with   Nail Problem    "It's doing good."  . Patient notes nails are thick, discolored, elongated and painful in shoegear when trying to ambulate.  She also presents for recheck of the left hallux contusion in which the left hallux medial nail border was avulsed.  She notes this is healing well and not giving her any trouble.  PCP is Willow Ora, MD. last seen was 06/20/2022.  Most recent HbA1c was obtained on 06/20/2022 and was 6.6.  Allergies  Allergen Reactions   Hydrocodone Itching   Linalool    Methylisothiazolinone Other (See Comments)    Other reaction(s): Other (See Comments) Positive patch test Positive patch test    Propylene Glycol    Aspirin Itching and Other (See Comments)    Lower dose doesn't make her itch (she takes it every day)   Egg-Derived Products Nausea Only and Other (See Comments)    No reaction if in another food   Hydrocodone-Guaifenesin Rash    Review of Systems: Negative except as noted in the HPI.  Objective:  Vitals:   07/17/22 0925  BP: 137/70  Pulse: 86    Stacy Campbell is a pleasant 67 y.o. female in NAD. AAO x 3.  Vascular Examination: Capillary refill time is 3-5 seconds to toes bilateral. Palpable pedal pulses b/l LE. Digital hair present b/l. No pedal edema b/l. Skin temperature gradient WNL b/l. No varicosities b/l. No cyanosis or clubbing noted b/l.   Dermatological Examination: Pedal skin with normal turgor, texture and tone b/l. No open wounds. No interdigital macerations b/l. Toenails x10 are 3mm thick, discolored, dystrophic with subungual debris. There is pain with compression of the nail plates.  They are elongated x10.  The left hallux medial nail border is dry with eschar formation.  No signs of infection are noted  Neurological  Examination: Protective sensation intact bilateral LE.      Latest Ref Rng & Units 06/20/2022   10:53 AM 12/15/2021   10:45 AM 10/06/2021    9:30 AM  Hemoglobin A1C  Hemoglobin-A1c 4.6 - 6.5 % 6.6  6.7  6.2     Assessment/Plan: 1. Pain due to onychomycosis of toenails of both feet   2. Type 2 diabetes mellitus without complication, without long-term current use of insulin (HCC)     The mycotic toenails were sharply debrided x10 with sterile nail nippers and a power debriding burr to decrease bulk/thickness and length.    Return in about 3 months (around 10/17/2022) for RFC.   Clerance Lav, DPM, FACFAS [facility]     [address] [place], [place] [address]                Office [phone]  Fax [phone]

## 2022-07-30 ENCOUNTER — Ambulatory Visit (INDEPENDENT_AMBULATORY_CARE_PROVIDER_SITE_OTHER): Payer: Medicare HMO

## 2022-07-30 VITALS — Wt 231.0 lb

## 2022-07-30 DIAGNOSIS — Z0000 Encounter for general adult medical examination without abnormal findings: Secondary | ICD-10-CM

## 2022-07-30 NOTE — Patient Instructions (Signed)
Stacy Campbell , Thank you for taking time to come for your Medicare Wellness Visit. I appreciate your ongoing commitment to your health goals. Please review the following plan we discussed and let me know if I can assist you in the future.   These are the goals we discussed:  Goals      Patient Stated     Lose weight      Patient Stated     Do better with exercise      Patient Stated     Lose weight         This is a list of the screening recommended for you and due dates:  Health Maintenance  Topic Date Due   Medicare Annual Wellness Visit  07/29/2022   Flu Shot  08/09/2022   Complete foot exam   10/07/2022   Hemoglobin A1C  12/20/2022   Eye exam for diabetics  04/16/2023   Mammogram  05/23/2023   Yearly kidney function blood test for diabetes  06/20/2023   Yearly kidney health urinalysis for diabetes  06/20/2023   DEXA scan (bone density measurement)  04/21/2024   Colon Cancer Screening  12/14/2029   Pneumonia Vaccine  Completed   Hepatitis C Screening  Completed   HPV Vaccine  Aged Out   DTaP/Tdap/Td vaccine  Discontinued   COVID-19 Vaccine  Discontinued   Zoster (Shingles) Vaccine  Discontinued    Advanced directives: Advance directive discussed with you today. Even though you declined this today please call our office should you change your mind and we can give you the proper paperwork for you to fill out.  Conditions/risks identified: lose weight   Next appointment: Follow up in one year for your annual wellness visit    Preventive Care 65 Years and Older, Female Preventive care refers to lifestyle choices and visits with your health care provider that can promote health and wellness. What does preventive care include? A yearly physical exam. This is also called an annual well check. Dental exams once or twice a year. Routine eye exams. Ask your health care provider how often you should have your eyes checked. Personal lifestyle choices, including: Daily  care of your teeth and gums. Regular physical activity. Eating a healthy diet. Avoiding tobacco and drug use. Limiting alcohol use. Practicing safe sex. Taking low-dose aspirin every day. Taking vitamin and mineral supplements as recommended by your health care provider. What happens during an annual well check? The services and screenings done by your health care provider during your annual well check will depend on your age, overall health, lifestyle risk factors, and family history of disease. Counseling  Your health care provider may ask you questions about your: Alcohol use. Tobacco use. Drug use. Emotional well-being. Home and relationship well-being. Sexual activity. Eating habits. History of falls. Memory and ability to understand (cognition). Work and work Astronomer. Reproductive health. Screening  You may have the following tests or measurements: Height, weight, and BMI. Blood pressure. Lipid and cholesterol levels. These may be checked every 5 years, or more frequently if you are over 63 years old. Skin check. Lung cancer screening. You may have this screening every year starting at age 26 if you have a 30-pack-year history of smoking and currently smoke or have quit within the past 15 years. Fecal occult blood test (FOBT) of the stool. You may have this test every year starting at age 51. Flexible sigmoidoscopy or colonoscopy. You may have a sigmoidoscopy every 5 years or a colonoscopy every  10 years starting at age 54. Hepatitis C blood test. Hepatitis B blood test. Sexually transmitted disease (STD) testing. Diabetes screening. This is done by checking your blood sugar (glucose) after you have not eaten for a while (fasting). You may have this done every 1-3 years. Bone density scan. This is done to screen for osteoporosis. You may have this done starting at age 74. Mammogram. This may be done every 1-2 years. Talk to your health care provider about how often you  should have regular mammograms. Talk with your health care provider about your test results, treatment options, and if necessary, the need for more tests. Vaccines  Your health care provider may recommend certain vaccines, such as: Influenza vaccine. This is recommended every year. Tetanus, diphtheria, and acellular pertussis (Tdap, Td) vaccine. You may need a Td booster every 10 years. Zoster vaccine. You may need this after age 5. Pneumococcal 13-valent conjugate (PCV13) vaccine. One dose is recommended after age 39. Pneumococcal polysaccharide (PPSV23) vaccine. One dose is recommended after age 36. Talk to your health care provider about which screenings and vaccines you need and how often you need them. This information is not intended to replace advice given to you by your health care provider. Make sure you discuss any questions you have with your health care provider. Document Released: 01/21/2015 Document Revised: 09/14/2015 Document Reviewed: 10/26/2014 Elsevier Interactive Patient Education  2017 [employer].  Fall Prevention in the Home Falls can cause injuries. They can happen to people of all ages. There are many things you can do to make your home safe and to help prevent falls. What can I do on the outside of my home? Regularly fix the edges of walkways and driveways and fix any cracks. Remove anything that might make you trip as you walk through a door, such as a raised step or threshold. Trim any bushes or trees on the path to your home. Use bright outdoor lighting. Clear any walking paths of anything that might make someone trip, such as rocks or tools. Regularly check to see if handrails are loose or broken. Make sure that both sides of any steps have handrails. Any raised decks and porches should have guardrails on the edges. Have any leaves, snow, or ice cleared regularly. Use sand or salt on walking paths during winter. Clean up any spills in your garage right away.  This includes oil or grease spills. What can I do in the bathroom? Use night lights. Install grab bars by the toilet and in the tub and shower. Do not use towel bars as grab bars. Use non-skid mats or decals in the tub or shower. If you need to sit down in the shower, use a plastic, non-slip stool. Keep the floor dry. Clean up any water that spills on the floor as soon as it happens. Remove soap buildup in the tub or shower regularly. Attach bath mats securely with double-sided non-slip rug tape. Do not have throw rugs and other things on the floor that can make you trip. What can I do in the bedroom? Use night lights. Make sure that you have a light by your bed that is easy to reach. Do not use any sheets or blankets that are too big for your bed. They should not hang down onto the floor. Have a firm chair that has side arms. You can use this for support while you get dressed. Do not have throw rugs and other things on the floor that can make you  trip. What can I do in the kitchen? Clean up any spills right away. Avoid walking on wet floors. Keep items that you use a lot in easy-to-reach places. If you need to reach something above you, use a strong step stool that has a grab bar. Keep electrical cords out of the way. Do not use floor polish or wax that makes floors slippery. If you must use wax, use non-skid floor wax. Do not have throw rugs and other things on the floor that can make you trip. What can I do with my stairs? Do not leave any items on the stairs. Make sure that there are handrails on both sides of the stairs and use them. Fix handrails that are broken or loose. Make sure that handrails are as long as the stairways. Check any carpeting to make sure that it is firmly attached to the stairs. Fix any carpet that is loose or worn. Avoid having throw rugs at the top or bottom of the stairs. If you do have throw rugs, attach them to the floor with carpet tape. Make sure that  you have a light switch at the top of the stairs and the bottom of the stairs. If you do not have them, ask someone to add them for you. What else can I do to help prevent falls? Wear shoes that: Do not have high heels. Have rubber bottoms. Are comfortable and fit you well. Are closed at the toe. Do not wear sandals. If you use a stepladder: Make sure that it is fully opened. Do not climb a closed stepladder. Make sure that both sides of the stepladder are locked into place. Ask someone to hold it for you, if possible. Clearly mark and make sure that you can see: Any grab bars or handrails. First and last steps. Where the edge of each step is. Use tools that help you move around (mobility aids) if they are needed. These include: Canes. Walkers. Scooters. Crutches. Turn on the lights when you go into a dark area. Replace any light bulbs as soon as they burn out. Set up your furniture so you have a clear path. Avoid moving your furniture around. If any of your floors are uneven, fix them. If there are any pets around you, be aware of where they are. Review your medicines with your doctor. Some medicines can make you feel dizzy. This can increase your chance of falling. Ask your doctor what other things that you can do to help prevent falls. This information is not intended to replace advice given to you by your health care provider. Make sure you discuss any questions you have with your health care provider. Document Released: 10/21/2008 Document Revised: 06/02/2015 Document Reviewed: 01/29/2014 Elsevier Interactive Patient Education  2017 [employer].

## 2022-07-30 NOTE — Progress Notes (Signed)
Subjective:   Stacy Campbell is a 67 y.o. female who presents for Medicare Annual (Subsequent) preventive examination.  Per patient no change in vitals since last visit; unable to obtain new vitals due to this being a telehealth visit. 07/30/22 Patient was unable to self-report vital signs via telehealth due to a lack of equipment at home.    Visit Complete: Virtual  I connected with  Stacy Campbell on 07/30/22 by a audio enabled telemedicine application and verified that I am speaking with the correct person using two identifiers.  Patient Location: Home  Provider Location: Office/Clinic  I discussed the limitations of evaluation and management by telemedicine. The patient expressed understanding and agreed to proceed.   Review of Systems     Cardiac Risk Factors include: advanced age (>6men, >14 women);diabetes mellitus;dyslipidemia;hypertension;obesity (BMI >30kg/m2)     Objective:    Today's Vitals   07/30/22 1548  Weight: 231 lb (104.8 kg)   Body mass index is 39.65 kg/m.     07/30/2022    3:52 PM 10/18/2021    8:23 AM 09/27/2021   12:18 PM 07/28/2021    2:22 PM 10/25/2020    9:11 AM 07/15/2020    2:38 PM 03/08/2020    9:38 AM  Advanced Directives  Does Patient Have a Medical Advance Directive? No Yes No No No No No  Type of Furniture conservator/restorer;Living will       Does patient want to make changes to medical advance directive?    Yes (MAU/Ambulatory/Procedural Areas - Information given)     Copy of Healthcare Power of Attorney in Chart?  No - copy requested       Would patient like information on creating a medical advance directive? No - Patient declined  Yes (ED - Information included in AVS)  No - Patient declined Yes (MAU/Ambulatory/Procedural Areas - Information given) Yes (MAU/Ambulatory/Procedural Areas - Information given)    Current Medications (verified) Outpatient Encounter Medications as of 07/30/2022  Medication  Sig   albuterol (VENTOLIN HFA) 108 (90 Base) MCG/ACT inhaler INHALE 2 PUFFS INTO THE LUNGS EVERY 6 HOURS AS NEEDED FOR WHEEZING OR SHORTNESS OF BREATH   amLODipine (NORVASC) 10 MG tablet Take 10 mg by mouth daily.   apixaban (ELIQUIS) 5 MG TABS tablet TAKE 1 TABLET(5 MG) BY MOUTH TWICE DAILY   ARIPiprazole (ABILIFY) 5 MG tablet Take 1 tablet (5 mg total) by mouth daily.   ARNUITY ELLIPTA 100 MCG/ACT AEPB INHALE 1 PUFF INTO THE LUNGS DAILY   busPIRone (BUSPAR) 10 MG tablet Take 10 mg by mouth 3 (three) times daily.   carvedilol (COREG) 25 MG tablet TAKE 1 TABLET(25 MG) BY MOUTH TWICE DAILY WITH A MEAL   cetirizine (ZYRTEC) 10 MG tablet Take by mouth.   desvenlafaxine (PRISTIQ) 50 MG 24 hr tablet Take 50 mg by mouth daily.   diltiazem (CARDIZEM CD) 360 MG 24 hr capsule Take 1 capsule (360 mg total) by mouth daily.   doxepin (SINEQUAN) 25 MG capsule Take 25 mg by mouth 3 (three) times daily as needed.   empagliflozin (JARDIANCE) 10 MG TABS tablet TAKE 1 TABLET(10 MG) BY MOUTH DAILY   furosemide (LASIX) 20 MG tablet Take 1 tablet by mouth daily as needed for weight gain/swelling   glucose blood test strip Test blood sugar daily and record on blood sugar log   Lancets (ACCU-CHEK SOFT TOUCH) lancets Check blood sugar daily and record in log   lisinopril-hydrochlorothiazide (ZESTORETIC) 20-25 MG  tablet Take 1 tablet by mouth daily.   metFORMIN (GLUCOPHAGE-XR) 500 MG 24 hr tablet Take 1 tablet (500 mg total) by mouth daily with breakfast.   mycophenolate (CELLCEPT) 500 MG tablet Take 2 tablets by mouth 2 (two) times daily.   nystatin cream (MYCOSTATIN) APPLY TOPICALLY TO THE AFFECTED AREA TWICE DAILY FOR 7 DAYS THEN AS NEEDED   olopatadine (PATANOL) 0.1 % ophthalmic solution Place 1 drop into both eyes 2 (two) times daily.   oxyCODONE-acetaminophen (PERCOCET) 10-325 MG tablet Take 1 tablet by mouth 5 (five) times daily as needed.   potassium chloride (KLOR-CON) 10 MEQ tablet Take 1 tablet by mouth  daily for 3 days then only when lasix taken   prazosin (MINIPRESS) 2 MG capsule Take 2 mg by mouth at bedtime.   REXULTI 2 MG TABS tablet Take 2 mg by mouth every morning.   rosuvastatin (CRESTOR) 10 MG tablet TAKE 1 TABLET(10 MG) BY MOUTH DAILY   tacrolimus (PROTOPIC) 0.1 % ointment Apply 1 Application topically 2 (two) times daily.   tirzepatide [facility]) 5 MG/0.5ML Pen Inject 5 mg into the skin once a week.   tiZANidine (ZANAFLEX) 4 MG tablet Take 4 mg by mouth 2 (two) times daily.   triamcinolone ointment (KENALOG) 0.1 % Apply 1 application topically 2 (two) times daily.   zolpidem (AMBIEN CR) 12.5 MG CR tablet Take 12.5 mg by mouth at bedtime.   EPINEPHrine 0.3 mg/0.3 mL IJ SOAJ injection Use as directed for severe allergic reaction (Patient not taking: Reported on 07/30/2022)   [DISCONTINUED] cephALEXin (KEFLEX) 500 MG capsule Take 1 capsule (500 mg total) by mouth 2 (two) times daily.   [DISCONTINUED] gabapentin (NEURONTIN) 300 MG capsule TAKE 1 CAPSULE(300 MG) BY MOUTH AT BEDTIME   No facility-administered encounter medications on file as of 07/30/2022.    Allergies (verified) Hydrocodone, Linalool, Methylisothiazolinone, Propylene glycol, Aspirin, Egg-derived products, and Hydrocodone-guaifenesin   History: Past Medical History:  Diagnosis Date   Angio-edema    Anxiety    Arthritis    "lower back" (01/29/2017)   Asthma    Bone spur    spine   Chronic lower back pain    Cirrhosis of liver (HCC) 12/2018   Depression    Diabetes mellitus without complication (HCC)    Eczema    GERD (gastroesophageal reflux disease)    Hepatitis C    was treated for this   Hyperplastic polyp of intestine    Hypertension    Insomnia    OSA (obstructive sleep apnea)    Pending formal sleep study   Osteoarthritis    Osteoporosis    Paroxysmal atrial fibrillation (HCC) 09/27/2021   cardioversion in 1990s; Noted to be in Afib-RVR when being evaluated by sleep medicine.   Splenic artery  aneurysm (HCC)    Urticaria    Past Surgical History:  Procedure Laterality Date   CARDIOVERSION  1990s   CARDIOVERSION N/A 10/18/2021   Procedure: CARDIOVERSION;  Surgeon: Jake Bathe, MD;  Location: [facility];  Service: Cardiovascular;  Laterality: N/A;   CHOLECYSTECTOMY N/A 01/22/2018   Procedure: LAPAROSCOPIC CHOLECYSTECTOMY ERAS PATHWAY;  Surgeon: Berna Bue, MD;  Location: [facility];  Service: General;  Laterality: N/A;   CYSTOSCOPY W/ URETERAL STENT PLACEMENT Left 01/29/2017   Procedure: CYSTOSCOPY WITH RETROGRADE PYELOGRAM/URETERAL STENT PLACEMENT;  Surgeon: Crist Fat, MD;  Location: [facility] OR;  Service: Urology;  Laterality: Left;   CYSTOSCOPY WITH RETROGRADE PYELOGRAM, URETEROSCOPY AND STENT PLACEMENT Left 01/29/2017   CYSTOSCOPY WITH RETROGRADE  PYELOGRAM/URETERAL STENT PLACEMENT   NM MYOVIEW LTD  10/28/2017   Nuclear stress EF: 63%. The left ventricular ejection fraction is normal (55-65%). There was no ST segment deviation noted during stress. The study is normal.  This is a low risk study.   NM MYOVIEW LTD  11/06/2021   ?  Myocardial infarction located in the anterior region? ->  Despite this, there is a EF of 71% with no RWMA.  READ AS LOW RISK/LOW RISK; in fact, on review of images the anterior defect appeared to be consistent with breast attenuation.  No evidence of ischemia or infarction.  Preserved EF 71%.   TRANSTHORACIC ECHOCARDIOGRAM  01/30/2017   Normal LV size and function-hyperdynamic/vigorous.  EF 65 to 70%. No RWMA.  Focal basal hypertrophy - Minimal LVOT gradient (2.6 m/s).  GRII DD.  Calcified AoV with no stenosis.  Mild MAC.  Trivial TR.  Peak PAP 55 mmHg.   Family History  Problem Relation Age of Onset   Asthma Mother    Diabetes Mother    Asthma Father    Diabetes Father    Cancer Father        prostate   Eczema Father    Alcoholism Brother    Asthma Brother    Diabetes Brother    Asthma Brother    Diabetes Brother    Asthma Brother     Diabetes Brother    Asthma Brother    Diabetes Brother    Hepatitis B Daughter    Breast cancer Neg Hx    Sleep apnea Neg Hx    Social History   Socioeconomic History   Marital status: Widowed    Spouse name: Not on file   Number of children: Not on file   Years of education: Not on file   Highest education level: Not on file  Occupational History   Not on file  Tobacco Use   Smoking status: Former    Current packs/day: 0.00    Average packs/day: 0.3 packs/day for 37.0 years (9.3 ttl pk-yrs)    Types: Cigarettes    Start date: 03/31/1971    Quit date: 03/30/2008    Years since quitting: 14.3   Smokeless tobacco: Never   Tobacco comments:    Former smoker 10/04/21  Vaping Use   Vaping status: Never Used  Substance and Sexual Activity   Alcohol use: No   Drug use: Never   Sexual activity: Not Currently    Birth control/protection: Post-menopausal  Other Topics Concern   Not on file  Social History Narrative   Caffiene none. Soda 2 daily.   Education: 12 th grade.Working Development worker, community.   Children 6, grandkids 18.       Social Determinants of Health   Financial Resource Strain: Low Risk  (07/28/2021)   Overall Financial Resource Strain (CARDIA)    Difficulty of Paying Living Expenses: Not hard at all  Food Insecurity: No Food Insecurity (07/28/2021)   Hunger Vital Sign    Worried About Running Out of Food in the Last Year: Never true    Ran Out of Food in the Last Year: Never true  Transportation Needs: No Transportation Needs (07/28/2021)   PRAPARE - Administrator, Civil Service (Medical): No    Lack of Transportation (Non-Medical): No  Physical Activity: Insufficiently Active (07/28/2021)   Exercise Vital Sign    Days of Exercise per Week: 2 days    Minutes of Exercise per Session: 50 min  Stress: Stress Concern  Present (07/28/2021)   [employer] of Occupational Health - Occupational Stress Questionnaire    Feeling of Stress : To some extent   Social Connections: Moderately Isolated (07/28/2021)   Social Connection and Isolation Panel [NHANES]    Frequency of Communication with Friends and Family: More than three times a week    Frequency of Social Gatherings with Friends and Family: Twice a week    Attends Religious Services: More than 4 times per year    Active Member of [employer] or Organizations: No    Attends Banker Meetings: Never    Marital Status: Widowed    Tobacco Counseling Counseling given: Not Answered Tobacco comments: Former smoker 10/04/21   Clinical Intake:  Pre-visit preparation completed: Yes  Pain : No/denies pain     BMI - recorded: 39.65 Nutritional Status: BMI > 30  Obese Diabetes: Yes CBG done?: Yes (pt stated 137) CBG resulted in Enter/ Edit results?: No Did pt. bring in CBG monitor from home?: No  How often do you need to have someone help you when you read instructions, pamphlets, or other written materials from your doctor or pharmacy?: 1 - Never  Interpreter Needed?: No  Information entered by :: Lanier Ensign, LPN   Activities of Daily Living    07/30/2022    3:49 PM  In your present state of health, do you have any difficulty performing the following activities:  Hearing? 0  Vision? 0  Difficulty concentrating or making decisions? 0  Walking or climbing stairs? 0  Dressing or bathing? 0  Doing errands, shopping? 0  Preparing Food and eating ? N  Using the Toilet? N  In the past six months, have you accidently leaked urine? N  Do you have problems with loss of bowel control? N  Managing your Medications? N  Managing your Finances? N  Housekeeping or managing your Housekeeping? N    Patient Care Team: Willow Ora, MD as PCP - General (Family Medicine) Marykay Lex, MD as PCP - Cardiology (Cardiology) Willis Modena, MD as Consulting Physician (Gastroenterology) Jonathon Bellows, NP as Nurse Practitioner (Psychology) Erroll Luna, [facility]  (Inactive) (Pharmacist)  Indicate any recent Medical Services you may have received from other than [facility] providers in the past year (date may be approximate).     Assessment:   This is a routine wellness examination for Adelheid.  Hearing/Vision screen Hearing Screening - Comments:: Pt denies any hearing issues  Vision Screening - Comments:: Pt [facility]   Dietary issues and exercise activities discussed:     Goals Addressed             This Visit's Progress    Patient Stated       Lose weight        Depression Screen    07/30/2022    3:54 PM 06/20/2022   10:02 AM 06/20/2022   10:01 AM 12/15/2021   10:31 AM 10/06/2021    9:02 AM 09/05/2021    9:37 AM 07/28/2021    2:20 PM  PHQ 2/9 Scores  PHQ - 2 Score 4 4 0 0 0 0 4  PHQ- 9 Score 14 14   0 0 10    Fall Risk    07/30/2022    3:53 PM 06/20/2022   10:01 AM 12/15/2021   10:31 AM 10/06/2021    9:01 AM 09/05/2021    9:37 AM  Fall Risk   Falls in the past year? 0 0  0 0 0  Number falls in past yr: 0 0 0 0 0  Injury with Fall? 0 0 0 0 0  Risk for fall due to : Impaired vision No Fall Risks No Fall Risks No Fall Risks No Fall Risks  Follow up Falls prevention discussed Falls evaluation completed Falls evaluation completed Falls evaluation completed Falls evaluation completed    MEDICARE RISK AT HOME:  Medicare Risk at Home - 07/30/22 1553     Any stairs in or around the home? No    If so, are there any without handrails? No    Home free of loose throw rugs in walkways, pet beds, electrical cords, etc? Yes    Adequate lighting in your home to reduce risk of falls? Yes    Life alert? No    Use of a cane, walker or w/c? No    Grab bars in the bathroom? No    Shower chair or bench in shower? No    Elevated toilet seat or a handicapped toilet? No             TIMED UP AND GO:  Was the test performed?  No    Cognitive Function:        07/30/2022    3:54 PM 07/28/2021    2:24 PM 07/15/2020     2:42 PM  6CIT Screen  What Year? 0 points 0 points 0 points  What month? 0 points 0 points 0 points  What time? 0 points 0 points 0 points  Count back from 20 0 points 0 points 0 points  Months in reverse 4 points 0 points 0 points  Repeat phrase 2 points 0 points 0 points  Total Score 6 points 0 points 0 points    Immunizations Immunization History  Administered Date(s) Administered   Fluad Quad(high Dose 65+) 11/18/2020, 10/06/2021   Hepatitis B, ADULT 10/12/2014, 11/09/2014, 04/11/2015   Influenza Split 11/14/2010, 10/09/2011   Influenza Whole 10/13/2007, 01/10/2009, 11/22/2009   Influenza,inj,Quad PF,6+ Mos 10/10/2012, 10/08/2013, 10/15/2014, 09/29/2015, 11/06/2016, 09/10/2017, 10/15/2018, 09/30/2019   PFIZER(Purple Top)SARS-COV-2 Vaccination 03/20/2019, 04/15/2019, 10/15/2019   PNEUMOCOCCAL CONJUGATE-20 11/18/2020   Pneumococcal Polysaccharide-23 01/10/2009   Td 01/09/1999, 01/10/2009      Flu Vaccine status: Up to date  Pneumococcal vaccine status: Up to date  Covid-19 vaccine status: Completed vaccines  Qualifies for Shingles Vaccine? No    Screening Tests Health Maintenance  Topic Date Due   INFLUENZA VACCINE  08/09/2022   FOOT EXAM  10/07/2022   HEMOGLOBIN A1C  12/20/2022   OPHTHALMOLOGY EXAM  04/16/2023   MAMMOGRAM  05/23/2023   Diabetic kidney evaluation - eGFR measurement  06/20/2023   Diabetic kidney evaluation - Urine ACR  06/20/2023   Medicare Annual Wellness (AWV)  07/30/2023   DEXA SCAN  04/21/2024   Colonoscopy  12/14/2029   Pneumonia Vaccine 52+ Years old  Completed   Hepatitis C Screening  Completed   HPV VACCINES  Aged Out   DTaP/Tdap/Td  Discontinued   COVID-19 Vaccine  Discontinued   Zoster Vaccines- Shingrix  Discontinued    Health Maintenance  There are no preventive care reminders to display for this patient.   Colorectal cancer screening: Type of screening: Colonoscopy. Completed 12/15/19. Repeat every 10 years   Mammogram  status: Completed 05/23/22. Repeat every year  Bone Density status: Completed 04/22/19. Results reflect: Bone density results: NORMAL. Repeat every 5 years.  Additional Screening:  Hepatitis C Screening: Completed 01/04/20  Vision Screening: Recommended annual ophthalmology exams  for early detection of glaucoma and other disorders of the eye. Is the patient up to date with their annual eye exam?  Yes  Who is the provider or what is the name of the office in which the patient attends annual eye exams? [facility] If pt is not established with a provider, would they like to be referred to a provider to establish care? No .   Dental Screening: Recommended annual dental exams for proper oral hygiene  Diabetic Foot Exam: Diabetic Foot Exam: Overdue, Pt has been advised about the importance in completing this exam. Pt is scheduled for diabetic foot exam on ordered for 06/20/22.  Community Resource Referral / Chronic Care Management: CRR required this visit?  No   CCM required this visit?  No     Plan:     I have personally reviewed and noted the following in the patient's chart:   Medical and social history Use of alcohol, tobacco or illicit drugs  Current medications and supplements including opioid prescriptions. Patient is currently taking opioid prescriptions. Information provided to patient regarding non-opioid alternatives. Patient advised to discuss non-opioid treatment plan with their provider. Functional ability and status Nutritional status Physical activity Advanced directives List of other physicians Hospitalizations, surgeries, and ER visits in previous 12 months Vitals Screenings to include cognitive, depression, and falls Referrals and appointments  In addition, I have reviewed and discussed with patient certain preventive protocols, quality metrics, and best practice recommendations. A written personalized care plan for preventive services as well as general  preventive health recommendations were provided to patient.     Marzella Schlein, LPN   06/14/3014   After Visit Summary: (MyChart) Due to this being a telephonic visit, the after visit summary with patients personalized plan was offered to patient via MyChart   Nurse Notes: none

## 2022-09-12 ENCOUNTER — Telehealth: Payer: Self-pay | Admitting: Family Medicine

## 2022-09-12 ENCOUNTER — Inpatient Hospital Stay (HOSPITAL_COMMUNITY): Payer: Medicare HMO

## 2022-09-12 ENCOUNTER — Encounter (HOSPITAL_COMMUNITY): Payer: Self-pay | Admitting: Emergency Medicine

## 2022-09-12 ENCOUNTER — Emergency Department (HOSPITAL_COMMUNITY): Payer: Medicare HMO

## 2022-09-12 ENCOUNTER — Inpatient Hospital Stay (HOSPITAL_COMMUNITY)
Admission: EM | Admit: 2022-09-12 | Discharge: 2022-09-14 | DRG: 683 | Disposition: A | Discharge status: Home Health | Payer: Medicare HMO | Attending: Family Medicine | Admitting: Family Medicine

## 2022-09-12 ENCOUNTER — Other Ambulatory Visit: Payer: Self-pay

## 2022-09-12 DIAGNOSIS — E1169 Type 2 diabetes mellitus with other specified complication: Secondary | ICD-10-CM | POA: Diagnosis present

## 2022-09-12 DIAGNOSIS — J4489 Other specified chronic obstructive pulmonary disease: Secondary | ICD-10-CM | POA: Diagnosis present

## 2022-09-12 DIAGNOSIS — E785 Hyperlipidemia, unspecified: Secondary | ICD-10-CM | POA: Diagnosis present

## 2022-09-12 DIAGNOSIS — D472 Monoclonal gammopathy: Secondary | ICD-10-CM | POA: Diagnosis present

## 2022-09-12 DIAGNOSIS — Z7985 Long-term (current) use of injectable non-insulin antidiabetic drugs: Secondary | ICD-10-CM

## 2022-09-12 DIAGNOSIS — E1159 Type 2 diabetes mellitus with other circulatory complications: Secondary | ICD-10-CM | POA: Diagnosis present

## 2022-09-12 DIAGNOSIS — L281 Prurigo nodularis: Secondary | ICD-10-CM | POA: Diagnosis present

## 2022-09-12 DIAGNOSIS — F339 Major depressive disorder, recurrent, unspecified: Secondary | ICD-10-CM | POA: Diagnosis present

## 2022-09-12 DIAGNOSIS — R413 Other amnesia: Secondary | ICD-10-CM | POA: Diagnosis present

## 2022-09-12 DIAGNOSIS — Z7901 Long term (current) use of anticoagulants: Secondary | ICD-10-CM | POA: Diagnosis not present

## 2022-09-12 DIAGNOSIS — K219 Gastro-esophageal reflux disease without esophagitis: Secondary | ICD-10-CM | POA: Diagnosis present

## 2022-09-12 DIAGNOSIS — Z888 Allergy status to other drugs, medicaments and biological substances status: Secondary | ICD-10-CM | POA: Diagnosis not present

## 2022-09-12 DIAGNOSIS — Z833 Family history of diabetes mellitus: Secondary | ICD-10-CM

## 2022-09-12 DIAGNOSIS — I4819 Other persistent atrial fibrillation: Secondary | ICD-10-CM | POA: Diagnosis present

## 2022-09-12 DIAGNOSIS — I959 Hypotension, unspecified: Secondary | ICD-10-CM | POA: Diagnosis present

## 2022-09-12 DIAGNOSIS — Z8719 Personal history of other diseases of the digestive system: Secondary | ICD-10-CM

## 2022-09-12 DIAGNOSIS — F1120 Opioid dependence, uncomplicated: Secondary | ICD-10-CM | POA: Diagnosis present

## 2022-09-12 DIAGNOSIS — Z7984 Long term (current) use of oral hypoglycemic drugs: Secondary | ICD-10-CM

## 2022-09-12 DIAGNOSIS — K7460 Unspecified cirrhosis of liver: Secondary | ICD-10-CM | POA: Diagnosis present

## 2022-09-12 DIAGNOSIS — Z825 Family history of asthma and other chronic lower respiratory diseases: Secondary | ICD-10-CM

## 2022-09-12 DIAGNOSIS — N179 Acute kidney failure, unspecified: Principal | ICD-10-CM | POA: Diagnosis present

## 2022-09-12 DIAGNOSIS — Z8619 Personal history of other infectious and parasitic diseases: Secondary | ICD-10-CM

## 2022-09-12 DIAGNOSIS — Z885 Allergy status to narcotic agent status: Secondary | ICD-10-CM | POA: Diagnosis not present

## 2022-09-12 DIAGNOSIS — F419 Anxiety disorder, unspecified: Secondary | ICD-10-CM | POA: Diagnosis present

## 2022-09-12 DIAGNOSIS — R4182 Altered mental status, unspecified: Secondary | ICD-10-CM

## 2022-09-12 DIAGNOSIS — G8929 Other chronic pain: Secondary | ICD-10-CM | POA: Diagnosis present

## 2022-09-12 DIAGNOSIS — Z87891 Personal history of nicotine dependence: Secondary | ICD-10-CM | POA: Diagnosis not present

## 2022-09-12 DIAGNOSIS — Z811 Family history of alcohol abuse and dependence: Secondary | ICD-10-CM

## 2022-09-12 DIAGNOSIS — G4733 Obstructive sleep apnea (adult) (pediatric): Secondary | ICD-10-CM | POA: Diagnosis present

## 2022-09-12 DIAGNOSIS — L309 Dermatitis, unspecified: Secondary | ICD-10-CM | POA: Diagnosis present

## 2022-09-12 DIAGNOSIS — Z91012 Allergy to eggs: Secondary | ICD-10-CM | POA: Diagnosis not present

## 2022-09-12 DIAGNOSIS — Z79899 Other long term (current) drug therapy: Secondary | ICD-10-CM

## 2022-09-12 DIAGNOSIS — M810 Age-related osteoporosis without current pathological fracture: Secondary | ICD-10-CM | POA: Diagnosis present

## 2022-09-12 DIAGNOSIS — E119 Type 2 diabetes mellitus without complications: Secondary | ICD-10-CM

## 2022-09-12 DIAGNOSIS — I152 Hypertension secondary to endocrine disorders: Secondary | ICD-10-CM | POA: Diagnosis present

## 2022-09-12 DIAGNOSIS — Z9049 Acquired absence of other specified parts of digestive tract: Secondary | ICD-10-CM

## 2022-09-12 DIAGNOSIS — Z6841 Body Mass Index (BMI) 40.0 and over, adult: Secondary | ICD-10-CM | POA: Diagnosis not present

## 2022-09-12 DIAGNOSIS — R296 Repeated falls: Secondary | ICD-10-CM | POA: Diagnosis present

## 2022-09-12 DIAGNOSIS — Z79624 Long term (current) use of inhibitors of nucleotide synthesis: Secondary | ICD-10-CM

## 2022-09-12 DIAGNOSIS — G4700 Insomnia, unspecified: Secondary | ICD-10-CM | POA: Diagnosis present

## 2022-09-12 DIAGNOSIS — K7469 Other cirrhosis of liver: Secondary | ICD-10-CM | POA: Diagnosis not present

## 2022-09-12 DIAGNOSIS — W19XXXA Unspecified fall, initial encounter: Secondary | ICD-10-CM

## 2022-09-12 LAB — URINALYSIS, ROUTINE W REFLEX MICROSCOPIC
Bacteria, UA: NONE SEEN
Bilirubin Urine: NEGATIVE
Glucose, UA: 50 mg/dL — AB
Ketones, ur: NEGATIVE mg/dL
Leukocytes,Ua: NEGATIVE
Nitrite: NEGATIVE
Protein, ur: NEGATIVE mg/dL
Specific Gravity, Urine: 1.014 (ref 1.005–1.030)
pH: 5 (ref 5.0–8.0)

## 2022-09-12 LAB — CBC WITH DIFFERENTIAL/PLATELET
Abs Immature Granulocytes: 0.01 10*3/uL (ref 0.00–0.07)
Basophils Absolute: 0 10*3/uL (ref 0.0–0.1)
Basophils Relative: 0 %
Eosinophils Absolute: 0.2 10*3/uL (ref 0.0–0.5)
Eosinophils Relative: 3 %
HCT: 41.8 % (ref 36.0–46.0)
Hemoglobin: 13.1 g/dL (ref 12.0–15.0)
Immature Granulocytes: 0 %
Lymphocytes Relative: 16 %
Lymphs Abs: 1.1 10*3/uL (ref 0.7–4.0)
MCH: 25.2 pg — ABNORMAL LOW (ref 26.0–34.0)
MCHC: 31.3 g/dL (ref 30.0–36.0)
MCV: 80.5 fL (ref 80.0–100.0)
Monocytes Absolute: 0.6 10*3/uL (ref 0.1–1.0)
Monocytes Relative: 9 %
Neutro Abs: 4.8 10*3/uL (ref 1.7–7.7)
Neutrophils Relative %: 72 %
Platelets: 212 10*3/uL (ref 150–400)
RBC: 5.19 MIL/uL — ABNORMAL HIGH (ref 3.87–5.11)
RDW: 15 % (ref 11.5–15.5)
WBC: 6.7 10*3/uL (ref 4.0–10.5)
nRBC: 0 % (ref 0.0–0.2)

## 2022-09-12 LAB — BASIC METABOLIC PANEL
Anion gap: 13 (ref 5–15)
BUN: 51 mg/dL — ABNORMAL HIGH (ref 8–23)
CO2: 21 mmol/L — ABNORMAL LOW (ref 22–32)
Calcium: 9.5 mg/dL (ref 8.9–10.3)
Chloride: 103 mmol/L (ref 98–111)
Creatinine, Ser: 2.58 mg/dL — ABNORMAL HIGH (ref 0.44–1.00)
GFR, Estimated: 20 mL/min — ABNORMAL LOW (ref >60)
Glucose, Bld: 96 mg/dL (ref 70–99)
Potassium: 4.3 mmol/L (ref 3.5–5.1)
Sodium: 137 mmol/L (ref 135–145)

## 2022-09-12 MED ORDER — LACTATED RINGERS IV SOLN: INTRAVENOUS | Status: DC

## 2022-09-12 MED ORDER — SORBITOL 70 % SOLN: 30.0000 mL | Freq: Every day | Status: DC | PRN

## 2022-09-12 MED ORDER — ONDANSETRON HCL 4 MG PO TABS: 4.0000 mg | ORAL_TABLET | Freq: Four times a day (QID) | ORAL | Status: DC | PRN

## 2022-09-12 MED ORDER — ONDANSETRON HCL 4 MG/2ML IJ SOLN: 4.0000 mg | Freq: Four times a day (QID) | INTRAMUSCULAR | Status: DC | PRN

## 2022-09-12 MED ORDER — LORAZEPAM 2 MG/ML IJ SOLN
1.0000 mg | Freq: Once | INTRAMUSCULAR | Status: AC
  Administered 2022-09-12: 1 mg via INTRAVENOUS
  Filled 2022-09-12: qty 1

## 2022-09-12 MED ORDER — SENNOSIDES-DOCUSATE SODIUM 8.6-50 MG PO TABS: 1.0000 | ORAL_TABLET | Freq: Every evening | ORAL | Status: DC | PRN

## 2022-09-12 MED ORDER — SODIUM CHLORIDE 0.9 % IV BOLUS
500.0000 mL | Freq: Once | INTRAVENOUS | Status: AC
  Administered 2022-09-12: 500 mL via INTRAVENOUS

## 2022-09-12 NOTE — Telephone Encounter (Signed)
Agree, need to r/o head/brain injury

## 2022-09-12 NOTE — ED Notes (Signed)
Patient left to MRI.  Then upstairs after its done.

## 2022-09-12 NOTE — ED Provider Notes (Signed)
EMERGENCY DEPARTMENT AT [facility] Provider Note   CSN: [id number] Arrival date & time: 09/12/22  1055     History  Chief Complaint  Patient presents with   Fall   Hip Pain    Stacy Campbell is a 67 y.o. female.  HPI   67 year old female presents the emergency department with concern for generalized decline, multiple falls.  Most recently this morning.  She states that she was sitting on the side of the bed and tried to stand up when her feet got caught in the sheets and she fell down onto her bottom.  Did not hit her head today but did hit her head the other day.  She is anticoagulated.  Daughter states since these falls started that her mentation seems to be declining.  Patient does not complain specifically of a headache.  She has no dysuria or fever.  Patient's family member at bedside states that she seems very drowsy and confused at times.  Was recently restarted on her Ambien for sleep issues/insomnia.  Home Medications Prior to Admission medications   Medication Sig Start Date End Date Taking? Authorizing Provider  albuterol (VENTOLIN HFA) 108 (90 Base) MCG/ACT inhaler INHALE 2 PUFFS INTO THE LUNGS EVERY 6 HOURS AS NEEDED FOR WHEEZING OR SHORTNESS OF BREATH 06/05/22   Willow Ora, MD  amLODipine (NORVASC) 10 MG tablet Take 10 mg by mouth daily. 12/15/21   [provider]  apixaban (ELIQUIS) 5 MG TABS tablet TAKE 1 TABLET(5 MG) BY MOUTH TWICE DAILY 04/24/22   Marykay Lex, MD  ARIPiprazole (ABILIFY) 5 MG tablet Take 1 tablet (5 mg total) by mouth daily. 12/10/18   Marthenia Rolling, DO  ARNUITY ELLIPTA 100 MCG/ACT AEPB INHALE 1 PUFF INTO THE LUNGS DAILY 10/25/21   Willow Ora, MD  busPIRone (BUSPAR) 10 MG tablet Take 10 mg by mouth 3 (three) times daily. 10/02/21   [provider]  carvedilol (COREG) 25 MG tablet TAKE 1 TABLET(25 MG) BY MOUTH TWICE DAILY WITH A MEAL 12/15/21   Willow Ora, MD  cetirizine (ZYRTEC) 10 MG tablet  Take by mouth. 09/28/21   [provider]  desvenlafaxine (PRISTIQ) 50 MG 24 hr tablet Take 50 mg by mouth daily.    [provider]  diltiazem (CARDIZEM CD) 360 MG 24 hr capsule Take 1 capsule (360 mg total) by mouth daily. 10/30/21   Marykay Lex, MD  doxepin (SINEQUAN) 25 MG capsule Take 25 mg by mouth 3 (three) times daily as needed.    [provider]  empagliflozin (JARDIANCE) 10 MG TABS tablet TAKE 1 TABLET(10 MG) BY MOUTH DAILY 12/15/21   Willow Ora, MD  EPINEPHrine 0.3 mg/0.3 mL IJ SOAJ injection Use as directed for severe allergic reaction Patient not taking: Reported on 07/30/2022 02/12/18   Ellamae Sia, DO  furosemide (LASIX) 20 MG tablet Take 1 tablet by mouth daily as needed for weight gain/swelling 05/11/22   Marykay Lex, MD  glucose blood test strip Test blood sugar daily and record on blood sugar log 12/16/18   Marthenia Rolling, DO  Lancets (ACCU-CHEK SOFT TOUCH) lancets Check blood sugar daily and record in log 12/16/18   Marthenia Rolling, DO  lisinopril-hydrochlorothiazide (ZESTORETIC) 20-25 MG tablet Take 1 tablet by mouth daily. 12/15/21   Willow Ora, MD  metFORMIN (GLUCOPHAGE-XR) 500 MG 24 hr tablet Take 1 tablet (500 mg total) by mouth daily with breakfast. 12/15/21   Willow Ora,  MD  mycophenolate (CELLCEPT) 500 MG tablet Take 2 tablets by mouth 2 (two) times daily. 08/14/21   [provider]  nystatin cream (MYCOSTATIN) APPLY TOPICALLY TO THE AFFECTED AREA TWICE DAILY FOR 7 DAYS THEN AS NEEDED 09/26/20   Willow Ora, MD  olopatadine (PATANOL) 0.1 % ophthalmic solution Place 1 drop into both eyes 2 (two) times daily. 11/18/20   Willow Ora, MD  oxyCODONE-acetaminophen (PERCOCET) 10-325 MG tablet Take 1 tablet by mouth 5 (five) times daily as needed. 07/04/21   [provider]  potassium chloride (KLOR-CON) 10 MEQ tablet Take 1 tablet by mouth daily for 3 days then only when lasix taken 10/11/21   Fenton, Clint R, PA   prazosin (MINIPRESS) 2 MG capsule Take 2 mg by mouth at bedtime. 09/28/21   [provider]  REXULTI 2 MG TABS tablet Take 2 mg by mouth every morning. 06/01/22   [provider]  rosuvastatin (CRESTOR) 10 MG tablet TAKE 1 TABLET(10 MG) BY MOUTH DAILY 01/29/22   Willow Ora, MD  tacrolimus (PROTOPIC) 0.1 % ointment Apply 1 Application topically 2 (two) times daily. 08/14/21   [provider]  tirzepatide Greggory Keen) 5 MG/0.5ML Pen Inject 5 mg into the skin once a week. 06/20/22   Willow Ora, MD  tiZANidine (ZANAFLEX) 4 MG tablet Take 4 mg by mouth 2 (two) times daily.    [provider]  triamcinolone ointment (KENALOG) 0.1 % Apply 1 application topically 2 (two) times daily.    [provider]  zolpidem (AMBIEN CR) 12.5 MG CR tablet Take 12.5 mg by mouth at bedtime. 09/21/21   [provider]      Allergies    Hydrocodone, Linalool, Methylisothiazolinone, Propylene glycol, Aspirin, Egg-derived products, and Hydrocodone-guaifenesin    Review of Systems   Review of Systems  Constitutional:  Positive for activity change, appetite change and fatigue. Negative for fever.  Respiratory:  Negative for shortness of breath.   Cardiovascular:  Negative for chest pain.  Gastrointestinal:  Negative for abdominal pain, diarrhea and vomiting.  Genitourinary:  Negative for dysuria.  Musculoskeletal:  Negative for back pain and neck pain.  Skin:  Negative for rash.  Neurological:  Positive for headaches. Negative for seizures and syncope.  Psychiatric/Behavioral:  Positive for confusion.     Physical Exam Updated Vital Signs BP 108/61 (BP Location: Right Arm)   Pulse 90   Temp 99 F (37.2 C) (Oral)   Resp 18   Ht 5\' 4"  (1.626 m)   Wt 99.3 kg   SpO2 100%   BMI 37.59 kg/m  Physical Exam Vitals and nursing note reviewed.  Constitutional:      General: She is not in acute distress.    Appearance: Normal appearance.  HENT:     Head:  Normocephalic.     Mouth/Throat:     Mouth: Mucous membranes are moist.  Eyes:     Pupils: Pupils are equal, round, and reactive to light.  Cardiovascular:     Rate and Rhythm: Normal rate.  Pulmonary:     Effort: Pulmonary effort is normal. No respiratory distress.  Abdominal:     Palpations: Abdomen is soft.     Tenderness: There is no abdominal tenderness.  Musculoskeletal:        General: No swelling or deformity.     Cervical back: No rigidity or tenderness.  Skin:    General: Skin is warm.  Neurological:     Mental Status: She is  alert and oriented to person, place, and time. Mental status is at baseline.  Psychiatric:        Mood and Affect: Mood normal.     ED Results / Procedures / Treatments   Labs (all labs ordered are listed, but only abnormal results are displayed) Labs Reviewed  URINALYSIS, ROUTINE W REFLEX MICROSCOPIC - Abnormal; Notable for the following components:      Result Value   Glucose, UA 50 (*)    Hgb urine dipstick SMALL (*)    All other components within normal limits  CBC WITH DIFFERENTIAL/PLATELET - Abnormal; Notable for the following components:   RBC 5.19 (*)    MCH 25.2 (*)    All other components within normal limits  BASIC METABOLIC PANEL - Abnormal; Notable for the following components:   CO2 21 (*)    BUN 51 (*)    Creatinine, Ser 2.58 (*)    GFR, Estimated 20 (*)    All other components within normal limits  CBC WITH DIFFERENTIAL/PLATELET    EKG None  Radiology No results found.  Procedures Procedures    Medications Ordered in ED Medications  sodium chloride 0.9 % bolus 500 mL (has no administration in time range)    ED Course/ Medical Decision Making/ A&P                                 Medical Decision Making Amount and/or Complexity of Data Reviewed Labs: ordered. Radiology: ordered.   67 year old female presents emergency department with more frequent falls, weakness and episodes of confusion.  Vital  signs are stable on my arrival.  She does appear drowsy and at times confused but is otherwise appropriate and conversational.  Recently restarted on Ambien for insomnia.  Blood work identifies a new AKI.  Urinalysis shows a small amount of blood but no urinary tract infection.  She denies any acute abdominal or flank pain.  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  IV hydration has been started.  With the frequent falls on anticoagulation with confusion we will do a head CT to rule out any other acute pathology as well as CT renal study to identify for any abnormal findings.  Regardless patient will require admission.  Patient and family member updated about admission plan and they agree, patient is pending CT imaging.  Patient signed out to Dr. Anitra Lauth.        Final Clinical Impression(s) / ED Diagnoses Final diagnoses:  None    Rx / DC Orders ED Discharge Orders     None         Rozelle Logan, DO 09/12/22 1633

## 2022-09-12 NOTE — ED Notes (Signed)
ED TO INPATIENT HANDOFF REPORT  ED Nurse Name and Phone #: [phone]  S Name/Age/Gender Stacy Campbell 67 y.o. female Room/Bed: 010C/010C  Code Status   Code Status: Prior  Home/SNF/Other Home Patient oriented to: self, place, time, and situation Is this baseline? Yes   Triage Complete: Triage complete  Chief Complaint MULTIPLPE FALL ON THINNERS NOW HAVING DARK STOOLS  Triage Note Pt. Stated, I've had 3 falls since Friday, Ive tripped over things, but my children thinks Im falling cause Im falling for no reason. The fall this morning was I got caught up in the bed sheets and fell. Both my hips are in pain.   Allergies Allergies  Allergen Reactions   Hydrocodone Itching   Linalool    Methylisothiazolinone Other (See Comments)    Other reaction(s): Other (See Comments) Positive patch test Positive patch test    Propylene Glycol    Aspirin Itching and Other (See Comments)    Lower dose doesn't make her itch (she takes it every day)   Egg-Derived Products Nausea Only and Other (See Comments)    No reaction if in another food   Hydrocodone-Guaifenesin Rash    Level of Care/Admitting Diagnosis ED Disposition     ED Disposition  Admit   Condition  --   Comment  The patient appears reasonably stabilized for admission considering the current resources, flow, and capabilities available in the ED at this time, and I doubt any other [facility] requiring further screening and/or treatment in the ED prior to admission is  present.          B Medical/Surgery History Past Medical History:  Diagnosis Date   Angio-edema    Anxiety    Arthritis    "lower back" (01/29/2017)   Asthma    Bone spur    spine   Chronic lower back pain    Cirrhosis of liver (HCC) 12/2018   Depression    Diabetes mellitus without complication (HCC)    Eczema    GERD (gastroesophageal reflux disease)    Hepatitis C    was treated for this   Hyperplastic polyp of intestine    Hypertension     Insomnia    OSA (obstructive sleep apnea)    Pending formal sleep study   Osteoarthritis    Osteoporosis    Paroxysmal atrial fibrillation (HCC) 09/27/2021   cardioversion in 1990s; Noted to be in Afib-RVR when being evaluated by sleep medicine.   Splenic artery aneurysm (HCC)    Urticaria    Past Surgical History:  Procedure Laterality Date   CARDIOVERSION  1990s   CARDIOVERSION N/A 10/18/2021   Procedure: CARDIOVERSION;  Surgeon: Jake Bathe, MD;  Location: [facility];  Service: Cardiovascular;  Laterality: N/A;   CHOLECYSTECTOMY N/A 01/22/2018   Procedure: LAPAROSCOPIC CHOLECYSTECTOMY ERAS PATHWAY;  Surgeon: Berna Bue, MD;  Location: [facility] ORS;  Service: General;  Laterality: N/A;   CYSTOSCOPY W/ URETERAL STENT PLACEMENT Left 01/29/2017   Procedure: CYSTOSCOPY WITH RETROGRADE PYELOGRAM/URETERAL STENT PLACEMENT;  Surgeon: Crist Fat, MD;  Location: [facility] OR;  Service: Urology;  Laterality: Left;   CYSTOSCOPY WITH RETROGRADE PYELOGRAM, URETEROSCOPY AND STENT PLACEMENT Left 01/29/2017   CYSTOSCOPY WITH RETROGRADE PYELOGRAM/URETERAL STENT PLACEMENT   NM MYOVIEW LTD  10/28/2017   Nuclear stress EF: 63%. The left ventricular ejection fraction is normal (55-65%). There was no ST segment deviation noted during stress. The study is normal.  This is a low risk study.   NM MYOVIEW LTD  11/06/2021   ?  Myocardial infarction located in the anterior region? ->  Despite this, there is a EF of 71% with no RWMA.  READ AS LOW RISK/LOW RISK; in fact, on review of images the anterior defect appeared to be consistent with breast attenuation.  No evidence of ischemia or infarction.  Preserved EF 71%.   TRANSTHORACIC ECHOCARDIOGRAM  01/30/2017   Normal LV size and function-hyperdynamic/vigorous.  EF 65 to 70%. No RWMA.  Focal basal hypertrophy - Minimal LVOT gradient (2.6 m/s).  GRII DD.  Calcified AoV with no stenosis.  Mild MAC.  Trivial TR.  Peak PAP 55 mmHg.     A IV  Location/Drains/Wounds Patient Lines/Drains/Airways Status     Active Line/Drains/Airways     Name Placement date Placement time Site Days   Peripheral IV 09/12/22 20 G Left Antecubital 09/12/22  1355  Antecubital  less than 1   Ureteral Drain/Stent Left ureter 01/29/17  1955  Left ureter  2052   Incision - 5 Ports Abdomen Right;Lateral Right;Medial Mid;Medial Mid;Upper Left;Upper 01/22/18  0835  -- 1694   Wound 10/13/11 Excoriated (Scratch marks) Leg Lower open areas form itching skin very red and swollen 10/13/11  0225  Leg  3987   Wound 11/18/11 Other (Comment) Leg Lower;Left redness, clear drainage, swelling 11/18/11  1815  Leg  3951   Wound 11/18/11  11/18/11  1816  --  3951            Intake/Output Last 24 hours  Intake/Output Summary (Last 24 hours) at 09/12/2022 1848 Last data filed at 09/12/2022 1700 Gross per 24 hour  Intake 500 ml  Output --  Net 500 ml    Labs/Imaging Results for orders placed or performed during the hospital encounter of 09/12/22 (from the past 48 hour(s))  Urinalysis, Routine w reflex microscopic -Urine, Clean Catch     Status: Abnormal   Collection Time: 09/12/22 11:22 AM  Result Value Ref Range   Color, Urine YELLOW YELLOW   APPearance CLEAR CLEAR   Specific Gravity, Urine 1.014 1.005 - 1.030   pH 5.0 5.0 - 8.0   Glucose, UA 50 (A) NEGATIVE mg/dL   Hgb urine dipstick SMALL (A) NEGATIVE   Bilirubin Urine NEGATIVE NEGATIVE   Ketones, ur NEGATIVE NEGATIVE mg/dL   Protein, ur NEGATIVE NEGATIVE mg/dL   Nitrite NEGATIVE NEGATIVE   Leukocytes,Ua NEGATIVE NEGATIVE   RBC / HPF 0-5 0 - 5 RBC/hpf   WBC, UA 0-5 0 - 5 WBC/hpf   Bacteria, UA NONE SEEN NONE SEEN   Squamous Epithelial / HPF 0-5 0 - 5 /HPF    Comment: Performed at [facility], [address] [place], [place] [address]  CBC with Differential/Platelet     Status: Abnormal   Collection Time: 09/12/22 12:55 PM  Result Value Ref Range   WBC 6.7 4.0 - 10.5 K/uL   RBC 5.19 (H) 3.87 -  5.11 MIL/uL   Hemoglobin 13.1 12.0 - 15.0 g/dL   HCT 60.4 54.0 - 98.1 %   MCV 80.5 80.0 - 100.0 fL   MCH 25.2 (L) 26.0 - 34.0 pg   MCHC 31.3 30.0 - 36.0 g/dL   RDW 19.1 47.8 - 29.5 %   Platelets 212 150 - 400 K/uL   nRBC 0.0 0.0 - 0.2 %   Neutrophils Relative % 72 %   Neutro Abs 4.8 1.7 - 7.7 K/uL   Lymphocytes Relative 16 %   Lymphs Abs 1.1 0.7 - 4.0 K/uL   Monocytes Relative 9 %  Monocytes Absolute 0.6 0.1 - 1.0 K/uL   Eosinophils Relative 3 %   Eosinophils Absolute 0.2 0.0 - 0.5 K/uL   Basophils Relative 0 %   Basophils Absolute 0.0 0.0 - 0.1 K/uL   Immature Granulocytes 0 %   Abs Immature Granulocytes 0.01 0.00 - 0.07 K/uL    Comment: Performed at [facility], [address], [place], [place] [address]  Basic metabolic panel     Status: Abnormal   Collection Time: 09/12/22  1:50 PM  Result Value Ref Range   Sodium 137 135 - 145 mmol/L   Potassium 4.3 3.5 - 5.1 mmol/L   Chloride 103 98 - 111 mmol/L   CO2 21 (L) 22 - 32 mmol/L   Glucose, Bld 96 70 - 99 mg/dL    Comment: Glucose reference range applies only to samples taken after fasting for at least 8 hours.   BUN 51 (H) 8 - 23 mg/dL   Creatinine, Ser 6.04 (H) 0.44 - 1.00 mg/dL   Calcium 9.5 8.9 - 54.0 mg/dL   GFR, Estimated 20 (L) >60 mL/min    Comment: (NOTE) Calculated using the CKD-EPI Creatinine Equation (2021)    Anion gap 13 5 - 15    Comment: Performed at [facility], [address], [place], [place] [address]   CT Renal Stone Study  Result Date: 09/12/2022 CLINICAL DATA:  Abdominal/flank pain. Stone suspected. Personal history of nephrolithiasis. Multiple falls. EXAM: CT ABDOMEN AND PELVIS WITHOUT CONTRAST TECHNIQUE: Multidetector CT imaging of the abdomen and pelvis was performed following the standard protocol without IV contrast. RADIATION DOSE REDUCTION: This exam was performed according to the departmental dose-optimization program which includes automated exposure control, adjustment of the mA  and/or kV according to patient size and/or use of iterative reconstruction technique. COMPARISON:  CT of the abdomen and pelvis with contrast 10/16/2017 at [facility]. FINDINGS: Lower chest: Mild atelectasis or scarring is present. Coronary artery calcifications are present. Hepatobiliary: No focal liver abnormality is seen. Status post cholecystectomy. No biliary dilatation. Pancreas: Unremarkable. No pancreatic ductal dilatation or surrounding inflammatory changes. Spleen: The spleen is enlarged. A new focal area of hypoattenuation is present in the superomedial aspect of the spleen measuring 2.0 cm. Adrenals/Urinary Tract: Adrenal glands are normal bilaterally. At least 4 nonobstructing stones are present in the right kidney. The largest measures 3 mm at the upper pole. 2 punctate nonobstructing stones are present in the left kidney. Ureters are within normal limits. The urinary bladder is normal. Stomach/Bowel: Stomach and duodenum are within normal limits. Small bowel is unremarkable. The terminal ileum is normal. Appendix is visualized and normal. The ascending and transverse colon are within normal limits. The descending and sigmoid colon are normal. Vascular/Lymphatic: Atherosclerotic calcifications are present in the aorta and branch vessels. No aneurysm is present. No significant adenopathy is present. Reproductive: Uterus and bilateral adnexa are unremarkable. Other: A fat containing left inguinal hernia is noted. No other significant ventral hernias are present. No free fluid or free air is present. Musculoskeletal: The vertebral body heights and AP alignment are normal. Endplate degenerative changes are greatest at L3-4, left greater than right. Interval progression is noted. No focal osseous lesions are present. The bony pelvis is normal apart from degenerative changes at the SI joints. The hips are located. No acute fractures are present. IMPRESSION: 1. No acute or focal lesion to  explain the patient's symptoms. 2. Bilateral nonobstructing nephrolithiasis. 3. Splenomegaly. 4. New focal area of hypoattenuation in  the superomedial aspect of the spleen measuring 2.0 cm. This is nonspecific, but could represent a splenic infarct. 5. Fat containing left inguinal hernia. 6. Coronary artery disease. 7.  Aortic Atherosclerosis (ICD10-I70.0). Electronically Signed   By: Marin Roberts M.D.   On: 09/12/2022 18:04   CT Head Wo Contrast  Result Date: 09/12/2022 CLINICAL DATA:  Multiple falls, pain EXAM: CT HEAD WITHOUT CONTRAST TECHNIQUE: Contiguous axial images were obtained from the base of the skull through the vertex without intravenous contrast. RADIATION DOSE REDUCTION: This exam was performed according to the departmental dose-optimization program which includes automated exposure control, adjustment of the mA and/or kV according to patient size and/or use of iterative reconstruction technique. COMPARISON:  Brain MRI 04/05/2007 FINDINGS: Brain: There is no acute intracranial hemorrhage, extra-axial fluid collection, or acute territorial infarct. There is an age-indeterminate infarct in the right thalamus and possible additional age-indeterminate infarct in the posterior limb of the left internal capsule. Parenchymal volume is normal. The ventricles are normal in size. Gray-white differentiation is preserved The pituitary and suprasellar region are normal. There is no mass lesion. There is no mass effect or midline shift. Vascular: There is calcification of the bilateral carotid siphons. Skull: Normal. Negative for fracture or focal lesion. Sinuses/Orbits: The imaged paranasal sinuses are clear. The globes and orbits are unremarkable. Other: The mastoid air cells and middle ear cavities are clear. IMPRESSION: 1. 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 2. No acute intracranial  hemorrhage or calvarial fracture. Electronically Signed   By: Lesia Hausen M.D.   On: 09/12/2022 17:56    Pending Labs Unresulted Labs (From admission, onward)     Start     Ordered   09/12/22 1121  CBC with Differential  Once,   STAT        09/12/22 1121            Vitals/Pain Today's Vitals   09/12/22 1108 09/12/22 1217 09/12/22 1328 09/12/22 1654  BP:  107/65 108/61 119/60  Pulse:  71 90 87  Resp:   18 16  Temp:   99 F (37.2 C) 98.6 F (37 C)  TempSrc:   Oral Oral  SpO2:  98% 100% 100%  Weight: 99.3 kg     Height: 5\' 4"  (1.626 m)     PainSc: 8        Isolation Precautions No active isolations  Medications Medications  lactated ringers infusion (has no administration in time range)  sodium chloride 0.9 % bolus 500 mL (0 mLs Intravenous Stopped 09/12/22 1700)    Mobility walks     Focused Assessments Neuro Assessment Handoff:      Neuro Assessment:      R Recommendations: See Admitting Provider Note  Report given to:   Additional Notes:

## 2022-09-12 NOTE — ED Notes (Signed)
BACK FROM MRI, patient ready to go upstairs.

## 2022-09-12 NOTE — ED Provider Notes (Signed)
Assumed care of the patient at 4:30 PM.  Patient presenting today due to recurrent falls, altered mental status over the last few days.  Patient is also on Xarelto and does report hitting her head.  Most recent fall was today.  Family member also reports patient has been confused at times and at times very drowsy.  Patient did start back on Ambien but was about a month ago there has been no other recent medication changes.  Labs today show that patient has new AKI with creatinine of 2.5 from her baseline of 0.8 in June.  CT renal does not show any evidence of obstruction at this time.  There is a new focal area of hypoattenuation in the superior medial aspect of the spleen which is nonspecific.  Patient is having no abdominal pain at this time.  Post void residual is less than 100.  Head CT with age-indeterminate infarct in the right thalamus and possibly in the posterior limb of the left internal capsule.  Patient has no focal findings on exam however will do an MRI to evaluate if these are new infarcts that could be attributing to some of her symptoms and falls.  Patient will be admitted for acute kidney injury, will continue fluids.  Neurology will need to be consulted if strokes are new.  Consulted hospitalist for admission.   Gwyneth Sprout, MD 09/12/22 1850

## 2022-09-12 NOTE — Telephone Encounter (Signed)
FYI: This call has been transferred to triage nurse: the Triage Nurse. Once the result note has been entered staff can address the message at that time.  Patient called in with the following symptoms:  Red Word: States Patient has had Multiple falls in the last month-fallen 3 time in the last 10 days. Claris Che (daughter) not sure if Patient was ever unconscious or if Patient head, face or neck or if Patient was injured , possibly imbalanced?   Please advise at Mobile [phone])  Message is routed to Provider Pool.

## 2022-09-12 NOTE — ED Triage Notes (Signed)
Pt. Stated, I've had 3 falls since Friday, Ive tripped over things, but my children thinks Im falling cause Im falling for no reason. The fall this morning was I got caught up in the bed sheets and fell. Both my hips are in pain.

## 2022-09-12 NOTE — Telephone Encounter (Signed)
Patient Advised: Go To ED Now  Patient Name First: Stacy Last: Campbell Gender: Female DOB: [birth year] Age: 68 Y 1 M 25 D Return Phone Number: [phone] (Primary) Address: City/ State/ Zip: [place]  [address] [facility] - Administrator, [facility] Provider Asencion Partridge- MD Contact Type Call Who Is Calling Patient / Member / Family / Caregiver Call Type Triage / Clinical Caller Name Armen Pickup Relationship To Patient Daughter Return Phone Number [phone] (Primary) Chief Complaint CONFUSION - new onset Reason for Call Symptomatic / Request for Health Information Initial Comment Caller states she is calling on behalf of her mother who has had several falls over the last few days. She states she is slow speaking and sounds weak. Translation No Nurse Assessment Nurse: D'Heur Ezzard Standing, RN, Adrienne Date/Time (Eastern Time): 09/12/2022 10:06:31 AM Confirm and document reason for call. If symptomatic, describe symptoms. ---Caller states she is calling on behalf of her mother who has had several falls over the last few days. She states her mother is slow speaking (since three days ago) after a fall and she sounds weak. She seems "sleepy". She is not currently with caller. Does the patient have any new or worsening symptoms? ---Yes Will a triage be completed? ---Yes Related visit to physician within the last 2 weeks? ---No Does the PT have any chronic conditions? (i.e. diabetes, asthma, this includes High risk factors for pregnancy, etc.) ---Yes List chronic conditions. ---HTN, A-fib, diabetes, anxiety Is this a behavioral health or substance abuse call? ---No Guidelines Guideline Title Affirmed Question Affirmed Notes Nurse Date/Time (Eastern Time) Weakness (Generalized) and Fatigue Black or tarry bowel movements D'Heur Ezzard Standing, RN, Adrienne 09/12/2022 10:14:18 AM  Disp. Time  Lamount Cohen Time) Disposition Final User 09/12/2022 10:01:58 AM Send to Urgent Queue Doug Sou 09/12/2022 10:18:05 AM Go to ED Now Yes D'Heur Ezzard Standing, RN, Adrienne Final Disposition 09/12/2022 10:18:05 AM Go to ED Now Yes D'Heur Ezzard Standing, RN, Maple Hudson Disagree/Comply Comply Caller Understands Yes PreDisposition Call Doctor Care Advice Given Per Guideline GO TO ED NOW: * Leave now. Drive carefully. ANOTHER ADULT SHOULD DRIVE: * It is better and safer if another adult drives instead of you. BRING MEDICINES: * Bring a list of your current medicines when you go to the Emergency Department (ER). CARE ADVICE given per Weakness and Fatigue (Adult) guideline.  Comments User: Hansel Starling, D'Heur Ezzard Standing, RN Date/Time Lamount Cohen Time): 09/12/2022 10:10:55 AM Daughter is conferencing call. User: Hansel Starling, D'Heur Ezzard Standing, RN Date/Time Lamount Cohen Time): 09/12/2022 10:15:06 AM BG is 137, Referrals [facility] - ED

## 2022-09-13 DIAGNOSIS — Z6841 Body Mass Index (BMI) 40.0 and over, adult: Secondary | ICD-10-CM

## 2022-09-13 DIAGNOSIS — E1159 Type 2 diabetes mellitus with other circulatory complications: Secondary | ICD-10-CM | POA: Diagnosis not present

## 2022-09-13 DIAGNOSIS — I4819 Other persistent atrial fibrillation: Secondary | ICD-10-CM | POA: Diagnosis not present

## 2022-09-13 DIAGNOSIS — E119 Type 2 diabetes mellitus without complications: Secondary | ICD-10-CM | POA: Diagnosis not present

## 2022-09-13 DIAGNOSIS — I152 Hypertension secondary to endocrine disorders: Secondary | ICD-10-CM

## 2022-09-13 DIAGNOSIS — N179 Acute kidney failure, unspecified: Secondary | ICD-10-CM | POA: Diagnosis not present

## 2022-09-13 LAB — CBC
HCT: 39.7 % (ref 36.0–46.0)
Hemoglobin: 12.4 g/dL (ref 12.0–15.0)
MCH: 24.4 pg — ABNORMAL LOW (ref 26.0–34.0)
MCHC: 31.2 g/dL (ref 30.0–36.0)
MCV: 78 fL — ABNORMAL LOW (ref 80.0–100.0)
Platelets: 203 10*3/uL (ref 150–400)
RBC: 5.09 MIL/uL (ref 3.87–5.11)
RDW: 15.1 % (ref 11.5–15.5)
WBC: 5.1 10*3/uL (ref 4.0–10.5)
nRBC: 0 % (ref 0.0–0.2)

## 2022-09-13 LAB — BASIC METABOLIC PANEL
Anion gap: 11 (ref 5–15)
BUN: 32 mg/dL — ABNORMAL HIGH (ref 8–23)
CO2: 23 mmol/L (ref 22–32)
Calcium: 9.1 mg/dL (ref 8.9–10.3)
Chloride: 104 mmol/L (ref 98–111)
Creatinine, Ser: 1.44 mg/dL — ABNORMAL HIGH (ref 0.44–1.00)
GFR, Estimated: 40 mL/min — ABNORMAL LOW (ref >60)
Glucose, Bld: 105 mg/dL — ABNORMAL HIGH (ref 70–99)
Potassium: 4 mmol/L (ref 3.5–5.1)
Sodium: 138 mmol/L (ref 135–145)

## 2022-09-13 LAB — HIV ANTIBODY (ROUTINE TESTING W REFLEX): HIV Screen 4th Generation wRfx: NONREACTIVE

## 2022-09-13 LAB — TSH: TSH: 3.743 u[IU]/mL (ref 0.350–4.500)

## 2022-09-13 LAB — MAGNESIUM: Magnesium: 2 mg/dL (ref 1.7–2.4)

## 2022-09-13 MED ORDER — VENLAFAXINE HCL ER 37.5 MG PO CP24
37.5000 mg | ORAL_CAPSULE | Freq: Every day | ORAL | Status: DC
  Administered 2022-09-14: 37.5 mg via ORAL
  Filled 2022-09-13: qty 1

## 2022-09-13 MED ORDER — ACETAMINOPHEN 325 MG PO TABS: 325.0000 mg | ORAL_TABLET | Freq: Every day | ORAL | Status: DC | PRN

## 2022-09-13 MED ORDER — ROSUVASTATIN CALCIUM 5 MG PO TABS
10.0000 mg | ORAL_TABLET | Freq: Every day | ORAL | Status: DC
  Administered 2022-09-14: 10 mg via ORAL
  Filled 2022-09-13: qty 2

## 2022-09-13 MED ORDER — AMLODIPINE BESYLATE 10 MG PO TABS: 10.0000 mg | ORAL_TABLET | Freq: Every day | ORAL | Status: DC

## 2022-09-13 MED ORDER — CARVEDILOL 12.5 MG PO TABS
25.0000 mg | ORAL_TABLET | Freq: Two times a day (BID) | ORAL | Status: DC
  Filled 2022-09-13: qty 2

## 2022-09-13 MED ORDER — APIXABAN 5 MG PO TABS
5.0000 mg | ORAL_TABLET | Freq: Two times a day (BID) | ORAL | Status: DC
  Administered 2022-09-13 – 2022-09-14 (×4): 5 mg via ORAL
  Filled 2022-09-13 (×4): qty 1

## 2022-09-13 MED ORDER — MYCOPHENOLATE MOFETIL 250 MG PO CAPS
1000.0000 mg | ORAL_CAPSULE | Freq: Two times a day (BID) | ORAL | Status: DC
  Administered 2022-09-13 (×2): 1000 mg via ORAL
  Filled 2022-09-13 (×2): qty 4

## 2022-09-13 MED ORDER — OXYCODONE HCL 5 MG PO TABS: 10.0000 mg | ORAL_TABLET | Freq: Every day | ORAL | Status: DC | PRN

## 2022-09-13 MED ORDER — ARIPIPRAZOLE 5 MG PO TABS
5.0000 mg | ORAL_TABLET | Freq: Every day | ORAL | Status: DC
  Administered 2022-09-13: 5 mg via ORAL
  Filled 2022-09-13: qty 1

## 2022-09-13 MED ORDER — BREXPIPRAZOLE 2 MG PO TABS
2.0000 mg | ORAL_TABLET | Freq: Every morning | ORAL | Status: DC
  Administered 2022-09-13 – 2022-09-14 (×2): 2 mg via ORAL
  Filled 2022-09-13 (×2): qty 1

## 2022-09-13 MED ORDER — OXYCODONE-ACETAMINOPHEN 10-325 MG PO TABS: 1.0000 | ORAL_TABLET | Freq: Every day | ORAL | Status: DC | PRN

## 2022-09-13 MED ORDER — ARIPIPRAZOLE 5 MG PO TABS: 5.0000 mg | ORAL_TABLET | Freq: Every day | ORAL | Status: DC

## 2022-09-13 MED ORDER — EMPAGLIFLOZIN 10 MG PO TABS
10.0000 mg | ORAL_TABLET | Freq: Every day | ORAL | Status: DC
  Filled 2022-09-13: qty 1

## 2022-09-13 MED ORDER — BUSPIRONE HCL 10 MG PO TABS
10.0000 mg | ORAL_TABLET | Freq: Three times a day (TID) | ORAL | Status: DC
  Administered 2022-09-13 – 2022-09-14 (×4): 10 mg via ORAL
  Filled 2022-09-13 (×4): qty 1

## 2022-09-13 NOTE — Progress Notes (Addendum)
OT Cancellation Note  Patient Details Name: Stacy Campbell MRN: [id number] DOB: Oct 26, 1955   Cancelled Treatment:    Reason Eval/Treat Not Completed: OT screened, no needs identified, will sign off Per PT, pt mobilizing well and requesting RW at DC to maximize confidence with mobility though no safety concerns noted. OT spoke with nursing, pt and daughter, deny concerns with ADLs or mobility; has family support for tasks as needed. No formal OT eval needed.   Lorre Munroe 09/13/2022, 1:24 PM

## 2022-09-13 NOTE — TOC Initial Note (Signed)
Transition of Care [facility]) - Initial/Assessment Note    Patient Details  Name: Stacy Campbell MRN: [id number] Date of Birth: [birth year]  Transition [facility]) CM/SW Contact:    Kermit Balo, RN Phone Number: 09/13/2022, 11:14 AM  Clinical Narrative:                 Pt is from home with her great grandson that is 67 yo. Her daughter and other family are in and out.  Her brother provides needed transportation. Patient manages her own medications and denies any issues.  No follow up per PT.  Pt has had some falls at home but she states is was due to her getting her foot caught or trying to step over a kids toy.  TOC following for d/c needs.   Expected Discharge Plan: Home/Self Care Barriers to Discharge: Continued Medical Work up   Patient Goals and CMS Choice            Expected Discharge Plan and Services       Living arrangements for the past 2 months: Single Family Home                                      Prior Living Arrangements/Services Living arrangements for the past 2 months: Single Family Home Lives with:: Adult Children, Other (Comment) (52 yo great grandson) Patient language and need for interpreter reviewed:: Yes Do you feel safe going back to the place where you live?: Yes            Criminal Activity/Legal Involvement Pertinent to Current Situation/Hospitalization: No - Comment as needed  Activities of Daily Living Home Assistive Devices/Equipment: CBG Meter ADL Screening (condition at time of admission) Patient's cognitive ability adequate to safely complete daily activities?: Yes Is the patient deaf or have difficulty hearing?: No Does the patient have difficulty seeing, even when wearing glasses/contacts?: No Does the patient have difficulty concentrating, remembering, or making decisions?: No Patient able to express need for assistance with ADLs?: Yes Does the patient have difficulty dressing or bathing?: No Independently  performs ADLs?: Yes (appropriate for developmental age) Does the patient have difficulty walking or climbing stairs?: No Weakness of Legs: None Weakness of Arms/Hands: None  Permission Sought/Granted                  Emotional Assessment Appearance:: Appears stated age Attitude/Demeanor/Rapport: Engaged Affect (typically observed): Accepting Orientation: : Oriented to Self, Oriented to Place, Oriented to  Time, Oriented to Situation   Psych Involvement: No (comment)  Admission diagnosis:  AKI (acute kidney injury) (HCC) [N17.9] Acute kidney injury (HCC) [N17.9] Fall in home, initial encounter [W19.XXXA, Y92.009] Altered mental status, unspecified altered mental status type [R41.82] Patient Active Problem List   Diagnosis Date Noted   Acute kidney injury (HCC) 09/12/2022   Contusion of left great toe with damage to nail 06/24/2022   Mixed simple and mucopurulent chronic bronchitis (HCC) 06/20/2022   Chronic narcotic dependence (HCC) 06/20/2022   Polypharmacy 06/20/2022   Sleep apnea 06/20/2022   Aortic ejection murmur 12/06/2021   Persistent atrial fibrillation (HCC) 10/04/2021   Secondary hypercoagulable state (HCC) 10/04/2021   DJD (degenerative joint disease) of cervical spine 09/06/2021   Chronic urticaria 06/12/2021   Stricture of ureter 01/04/2020   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 01/04/2020   Degenerative joint disease (DJD) of lumbar spine 01/04/2020  Monoclonal gammopathy of unknown significance (MGUS) 01/23/2019   Controlled type 2 diabetes mellitus without complication, without long-term current use of insulin (HCC) 12/12/2018   Hyperlipidemia associated with type 2 diabetes mellitus (HCC) 12/12/2018   Seasonal and perennial allergic rhinoconjunctivitis 02/12/2018   History of hepatitis C 10/11/2017   Former smoker 10/11/2017   Dyshidrotic eczema 09/10/2017   GERD (gastroesophageal reflux disease) 02/15/2017   Psoriasis 07/10/2015   Asthma with COPD  (HCC) 05/25/2013   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   Morbid obesity    Major depression, recurrent, chronic (HCC) 03/07/2006   Hypertension associated with diabetes (HCC) 03/07/2006   Chronic low back pain 03/07/2006   Insomnia 03/07/2006   PCP:  Willow Ora, MD Pharmacy:   [facility] [phone] - [place], [place] - [address] AT [facility] OF [address] [place] [address] Phone: [phone] Fax: [phone]  [id number] - [place], [place] - [address] AT [facility] OF [address] & [address] [facility] [address] Phone: [phone] Fax: [phone]     Social Determinants of Health (SDOH) Social History: SDOH Screenings   Food Insecurity: No Food Insecurity (09/12/2022)  Housing: Low Risk  (09/12/2022)  Transportation Needs: No Transportation Needs (09/12/2022)  Utilities: Not At Risk (09/12/2022)  Depression (PHQ2-9): High Risk (07/30/2022)  Financial Resource Strain: Low Risk  (07/28/2021)  Physical Activity: Insufficiently Active (07/28/2021)  Social Connections: Moderately Isolated (07/28/2021)  Stress: Stress Concern Present (07/28/2021)  Tobacco Use: Medium Risk (09/12/2022)   SDOH Interventions:     Readmission Risk Interventions     No data to display

## 2022-09-13 NOTE — Evaluation (Signed)
Speech Language Pathology Evaluation Patient Details Name: Stacy Campbell MRN: [id number] DOB: [birth year] Today's Date: 09/13/2022 Time: 8657-8469 SLP Time Calculation (min) (ACUTE ONLY): 12 min  Problem List:  Patient Active Problem List   Diagnosis Date Noted   Acute kidney injury (HCC) 09/12/2022   Contusion of left great toe with damage to nail 06/24/2022   Mixed simple and mucopurulent chronic bronchitis (HCC) 06/20/2022   Chronic narcotic dependence (HCC) 06/20/2022   Polypharmacy 06/20/2022   Sleep apnea 06/20/2022   Aortic ejection murmur 12/06/2021   Persistent atrial fibrillation (HCC) 10/04/2021   Secondary hypercoagulable state (HCC) 10/04/2021   DJD (degenerative joint disease) of cervical spine 09/06/2021   Chronic urticaria 06/12/2021   Stricture of ureter 01/04/2020   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 01/04/2020   Degenerative joint disease (DJD) of lumbar spine 01/04/2020   Monoclonal gammopathy of unknown significance (MGUS) 01/23/2019   Controlled type 2 diabetes mellitus without complication, without long-term current use of insulin (HCC) 12/12/2018   Hyperlipidemia associated with type 2 diabetes mellitus (HCC) 12/12/2018   Seasonal and perennial allergic rhinoconjunctivitis 02/12/2018   History of hepatitis C 10/11/2017   Former smoker 10/11/2017   Dyshidrotic eczema 09/10/2017   GERD (gastroesophageal reflux disease) 02/15/2017   Psoriasis 07/10/2015   Asthma with COPD (HCC) 05/25/2013   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   Morbid obesity    Major depression, recurrent, chronic (HCC) 03/07/2006   Hypertension associated with diabetes (HCC) 03/07/2006   Chronic low back pain 03/07/2006   Insomnia 03/07/2006   Past Medical History:  Past Medical History:  Diagnosis Date   Angio-edema    Anxiety    Arthritis    "lower back" (01/29/2017)   Asthma    Bone spur    spine   Chronic lower back pain    Cirrhosis of liver (HCC) 12/2018   Depression     Diabetes mellitus without complication (HCC)    Eczema    GERD (gastroesophageal reflux disease)    Hepatitis C    was treated for this   Hyperplastic polyp of intestine    Hypertension    Insomnia    OSA (obstructive sleep apnea)    Pending formal sleep study   Osteoarthritis    Osteoporosis    Paroxysmal atrial fibrillation (HCC) 09/27/2021   cardioversion in 1990s; Noted to be in Afib-RVR when being evaluated by sleep medicine.   Splenic artery aneurysm (HCC)    Urticaria    Past Surgical History:  Past Surgical History:  Procedure Laterality Date   CARDIOVERSION  1990s   CARDIOVERSION N/A 10/18/2021   Procedure: CARDIOVERSION;  Surgeon: Jake Bathe, MD;  Location: [facility];  Service: Cardiovascular;  Laterality: N/A;   CHOLECYSTECTOMY N/A 01/22/2018   Procedure: LAPAROSCOPIC CHOLECYSTECTOMY ERAS PATHWAY;  Surgeon: Berna Bue, MD;  Location: [facility] ORS;  Service: General;  Laterality: N/A;   CYSTOSCOPY W/ URETERAL STENT PLACEMENT Left 01/29/2017   Procedure: CYSTOSCOPY WITH RETROGRADE PYELOGRAM/URETERAL STENT PLACEMENT;  Surgeon: Crist Fat, MD;  Location: [facility] OR;  Service: Urology;  Laterality: Left;   CYSTOSCOPY WITH RETROGRADE PYELOGRAM, URETEROSCOPY AND STENT PLACEMENT Left 01/29/2017   CYSTOSCOPY WITH RETROGRADE PYELOGRAM/URETERAL STENT PLACEMENT   NM MYOVIEW LTD  10/28/2017   Nuclear stress EF: 63%. The left ventricular ejection fraction is normal (55-65%). There was no ST segment deviation noted during stress. The study is normal.  This is a low risk study.   NM MYOVIEW LTD  11/06/2021   ?  Myocardial infarction located in the anterior region? ->  Despite this, there is a EF of 71% with no RWMA.  READ AS LOW RISK/LOW RISK; in fact, on review of images the anterior defect appeared to be consistent with breast attenuation.  No evidence of ischemia or infarction.  Preserved EF 71%.   TRANSTHORACIC ECHOCARDIOGRAM  01/30/2017   Normal LV size and  function-hyperdynamic/vigorous.  EF 65 to 70%. No RWMA.  Focal basal hypertrophy - Minimal LVOT gradient (2.6 m/s).  GRII DD.  Calcified AoV with no stenosis.  Mild MAC.  Trivial TR.  Peak PAP 55 mmHg.   HPI:  Pt is a 67 y.o. female admitted 9/4 with AKI. She has had multiple falls at home after being put on Ambien. MRI negative for acute changes. PMH: GERD, persistent atrial fibrillation on Eliquis, hypertension, and type 2 diabetes mellitus   Assessment / Plan / Recommendation Clinical Impression  Pt scored 18/30 on the SLUMS (scores of 27 and above considered to be WNL), suggestive of cognitive difficulties. She had trouble with calculations and delayed recall, but believes this to be her norm. However, she also exhibits trouble with comprehension of more complex commands and paragraph level information, and she believes that this is a new change. Note that imaging was negative for acute changes - hopeful that some of her cognitive symptoms may continue to improve with management of underlying medical issues. However, pt says she has family that can support her at home if needed. Could also consider f/u SLP if symptoms do not resolve.    SLP Assessment  SLP Recommendation/Assessment: Patient needs continued Speech Lanaguage Pathology Services SLP Visit Diagnosis: Cognitive communication deficit (R41.841)    Recommendations for follow up therapy are one component of a multi-disciplinary discharge planning process, led by the attending physician.  Recommendations may be updated based on patient status, additional functional criteria and insurance authorization.    Follow Up Recommendations  Outpatient SLP (if symptoms do not resolve)    Assistance Recommended at Discharge  Frequent or constant Supervision/Assistance (increased supervision upon initial discharge home)  Functional Status Assessment Patient has had a recent decline in their functional status and demonstrates the ability to make  significant improvements in function in a reasonable and predictable amount of time.  Frequency and Duration min 2x/week  1 week      SLP Evaluation Cognition  Overall Cognitive Status: Impaired/Different from baseline Arousal/Alertness: Awake/alert Orientation Level: Oriented X4 Attention: Selective Selective Attention: Impaired Selective Attention Impairment: Verbal complex Memory: Impaired Memory Impairment: Retrieval deficit Problem Solving: Impaired Problem Solving Impairment: Verbal complex Safety/Judgment: Appears intact       Comprehension  Auditory Comprehension Overall Auditory Comprehension: Impaired Conversation: Simple Other Conversation Comments: difficulty with more complex commands and paragraph level comprehension    Expression Expression Primary Mode of Expression: Verbal Verbal Expression Overall Verbal Expression: Appears within functional limits for tasks assessed   Oral / Motor  Oral Motor/Sensory Function Overall Oral Motor/Sensory Function:  (?mild asymmetry of face at rest (R side)) Motor Speech Overall Motor Speech: Appears within functional limits for tasks assessed            Mahala Menghini., M.A. CCC-SLP Acute Rehabilitation Services Office [phone]  Secure chat preferred  09/13/2022, 12:26 PM

## 2022-09-13 NOTE — Hospital Course (Signed)
Mrs. Vessels is a 67 y.o. F with MO, MGUS, depression, HTN, HLD, DM, pAF on Eliquis, asthma/COPD, chronic dermatitis on Cellcept, compensated cirrhosis and chronic pain with daily opiate use who presented with falls.  Found to have AKI.

## 2022-09-13 NOTE — Plan of Care (Signed)
  Problem: Education: Goal: Knowledge of General Education information will improve Description Including pain rating scale, medication(s)/side effects and non-pharmacologic comfort measures Outcome: Progressing   

## 2022-09-13 NOTE — Evaluation (Signed)
Clinical/Bedside Swallow Evaluation Patient Details  Name: Stacy Campbell MRN: [id number] Date of Birth: 07-12-1955  Today's Date: 09/13/2022 Time: SLP Start Time (ACUTE ONLY): 0927 SLP Stop Time (ACUTE ONLY): 0939 SLP Time Calculation (min) (ACUTE ONLY): 12 min  Past Medical History:  Past Medical History:  Diagnosis Date   Angio-edema    Anxiety    Arthritis    "lower back" (01/29/2017)   Asthma    Bone spur    spine   Chronic lower back pain    Cirrhosis of liver (HCC) 12/2018   Depression    Diabetes mellitus without complication (HCC)    Eczema    GERD (gastroesophageal reflux disease)    Hepatitis C    was treated for this   Hyperplastic polyp of intestine    Hypertension    Insomnia    OSA (obstructive sleep apnea)    Pending formal sleep study   Osteoarthritis    Osteoporosis    Paroxysmal atrial fibrillation (HCC) 09/27/2021   cardioversion in 1990s; Noted to be in Afib-RVR when being evaluated by sleep medicine.   Splenic artery aneurysm (HCC)    Urticaria    Past Surgical History:  Past Surgical History:  Procedure Laterality Date   CARDIOVERSION  1990s   CARDIOVERSION N/A 10/18/2021   Procedure: CARDIOVERSION;  Surgeon: Jake Bathe, MD;  Location: [facility];  Service: Cardiovascular;  Laterality: N/A;   CHOLECYSTECTOMY N/A 01/22/2018   Procedure: LAPAROSCOPIC CHOLECYSTECTOMY ERAS PATHWAY;  Surgeon: Berna Bue, MD;  Location: [facility]S;  Service: General;  Laterality: N/A;   CYSTOSCOPY W/ URETERAL STENT PLACEMENT Left 01/29/2017   Procedure: CYSTOSCOPY WITH RETROGRADE PYELOGRAM/URETERAL STENT PLACEMENT;  Surgeon: Crist Fat, MD;  Location: [facility] OR;  Service: Urology;  Laterality: Left;   CYSTOSCOPY WITH RETROGRADE PYELOGRAM, URETEROSCOPY AND STENT PLACEMENT Left 01/29/2017   CYSTOSCOPY WITH RETROGRADE PYELOGRAM/URETERAL STENT PLACEMENT   NM MYOVIEW LTD  10/28/2017   Nuclear stress EF: 63%. The left ventricular ejection fraction is  normal (55-65%). There was no ST segment deviation noted during stress. The study is normal.  This is a low risk study.   NM MYOVIEW LTD  11/06/2021   ?  Myocardial infarction located in the anterior region? ->  Despite this, there is a EF of 71% with no RWMA.  READ AS LOW RISK/LOW RISK; in fact, on review of images the anterior defect appeared to be consistent with breast attenuation.  No evidence of ischemia or infarction.  Preserved EF 71%.   TRANSTHORACIC ECHOCARDIOGRAM  01/30/2017   Normal LV size and function-hyperdynamic/vigorous.  EF 65 to 70%. No RWMA.  Focal basal hypertrophy - Minimal LVOT gradient (2.6 m/s).  GRII DD.  Calcified AoV with no stenosis.  Mild MAC.  Trivial TR.  Peak PAP 55 mmHg.   HPI:  Stacy Campbell is a 67 y.o. female admitted 9/4 with AKI. She has had multiple falls at home after being put on Ambien. MRI negative for acute changes. PMH: GERD, persistent atrial fibrillation on Eliquis, hypertension, and type 2 diabetes mellitus    Assessment / Plan / Recommendation  Clinical Impression  Stacy Campbell oropharyngeal swallow appears to be functional across consistencies tested and she denies any acute changes or prior difficulties. Would continue with regular solids and thin liquids. SLP to sign off for swallowing. SLP Visit Diagnosis: Dysphagia, unspecified (R13.10)    Aspiration Risk  No limitations    Diet Recommendation Regular;Thin liquid    Liquid Administration via: Cup;Straw Medication Administration: Whole  meds with liquid Supervision: Patient able to self feed Postural Changes: Seated upright at 90 degrees    Other  Recommendations Oral Care Recommendations: Oral care BID    Recommendations for follow up therapy are one component of a multi-disciplinary discharge planning process, led by the attending physician.  Recommendations may be updated based on patient status, additional functional criteria and insurance authorization.  Follow up Recommendations No SLP follow up       Assistance Recommended at Discharge    Functional Status Assessment Patient has not had a recent decline in their functional status  Frequency and Duration            Prognosis        Swallow Study   General HPI: Stacy Campbell is a 67 y.o. female admitted 9/4 with AKI. She has had multiple falls at home after being put on Ambien. MRI negative for acute changes. PMH: GERD, persistent atrial fibrillation on Eliquis, hypertension, and type 2 diabetes mellitus Type of Study: Bedside Swallow Evaluation Previous Swallow Assessment: none in chart Diet Prior to this Study: Regular;Thin liquids (Level 0) Temperature Spikes Noted: No Respiratory Status: Room air History of Recent Intubation: No Behavior/Cognition: Alert;Cooperative;Pleasant mood Oral Cavity Assessment: Within Functional Limits Oral Care Completed by SLP: No Oral Cavity - Dentition: Dentures, top;Dentures, bottom Vision: Functional for self-feeding Self-Feeding Abilities: Able to feed self Patient Positioning: Upright in chair Baseline Vocal Quality: Normal Volitional Cough: Weak Volitional Swallow: Able to elicit    Oral/Motor/Sensory Function Overall Oral Motor/Sensory Function:  (?mild asymmetry of face at rest (R side))   Ice Chips Ice chips: Not tested   Thin Liquid Thin Liquid: Within functional limits Presentation: Self Fed;Straw    Nectar Thick Nectar Thick Liquid: Not tested   Honey Thick Honey Thick Liquid: Not tested   Puree Puree: Not tested (Stacy Campbell declined)   Solid     Solid: Within functional limits Presentation: Self Fed      Mahala Menghini., M.A. CCC-SLP Acute Rehabilitation Services Office [phone]  Secure chat preferred  09/13/2022,10:54 AM

## 2022-09-13 NOTE — Progress Notes (Signed)
    Patient: Stacy Campbell:[id number] DOB: [birth year]      Brief hospital course: [name] is a 67 y.o. F with MO, MGUS, depression, HTN, HLD, DM, pAF on Eliquis, asthma/COPD, chronic dermatitis on Cellcept, compensated cirrhosis and chronic pain with daily opiate use who presented with falls.  Found to have AKI.    This is a no charge note, for further details, please see the H&P by my partner, Dr. Gasper Sells from earlier today.   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 - Continue IV fluids - Trend Cr - Avoid hypotension and nephrotoxins  Encepholapathy better Suspect incipient dementia.  - Stop Ambien       Physical Exam: BP (!) 101/48 (BP Location: Right Arm)   Pulse 92   Temp 98.5 F (36.9 C) (Oral)   Resp 18   Ht 5\' 4"  (1.626 m)   Wt 99.3 kg   SpO2 100%   BMI 37.59 kg/m   Patient seen and examined.     Family Communication: Daughter by phone        Author: Alberteen Sam, MD 09/13/2022 2:50 PM

## 2022-09-13 NOTE — Evaluation (Signed)
Physical Therapy Evaluation Patient Details Name: Stacy Campbell MRN: [id number] DOB: 01-07-1956 Today's Date: 09/13/2022  History of Present Illness  Pt is a 67 y.o. female admitted 9/4 with AKI. She has had multiple falls at home after being put on Ambien. PMH: persistent atrial fibrillation on Eliquis, hypertension, and type 2 diabetes mellitus   Clinical Impression  Pt admitted with above diagnosis. PTA pt lived at home with family, I/mod I mobility/ADLs without AD. Pt currently with functional limitations due to the deficits listed below (see PT Problem List). On eval, she required supervision transfers and amb 160' with RW. No LOB noted. Pt will benefit from acute skilled PT to increase their independence and safety with mobility to allow discharge. PT to follow acutely. No follow up services indicated. Recommend RW for home.         If plan is discharge home, recommend the following: Assist for transportation   Can travel by private vehicle        Equipment Recommendations Rolling walker (2 wheels)  Recommendations for Other Services       Functional Status Assessment Patient has had a recent decline in their functional status and demonstrates the ability to make significant improvements in function in a reasonable and predictable amount of time.     Precautions / Restrictions Precautions Precautions: Fall Restrictions Weight Bearing Restrictions: No      Mobility  Bed Mobility Overal bed mobility: Modified Independent             General bed mobility comments: increased time, +rail    Transfers Overall transfer level: Needs assistance Equipment used: Ambulation equipment used Transfers: Sit to/from Stand Sit to Stand: Supervision           General transfer comment: no physical assist, supervision for safety/lines    Ambulation/Gait Ambulation/Gait assistance: Supervision Gait Distance (Feet): 160 Feet Assistive device: Rolling walker (2  wheels) Gait Pattern/deviations: Step-through pattern Gait velocity: decreased Gait velocity interpretation: 1.31 - 2.62 ft/sec, indicative of limited community ambulator   General Gait Details: steady gait with RW  Stairs            Wheelchair Mobility     Tilt Bed    Modified Rankin (Stroke Patients Only)       Balance Overall balance assessment: Needs assistance Sitting-balance support: No upper extremity supported, Feet supported Sitting balance-Leahy Scale: Good     Standing balance support: No upper extremity supported, Bilateral upper extremity supported, During functional activity Standing balance-Leahy Scale: Fair Standing balance comment: RW for hallway amb                             Pertinent Vitals/Pain Pain Assessment Pain Assessment: No/denies pain    Home Living Family/patient expects to be discharged to:: Private residence Living Arrangements: Other relatives;Children (daughter, brother, great grandson) Available Help at Discharge: Family Type of Home: House Home Access: Level entry       Home Layout: One level Home Equipment: None      Prior Function Prior Level of Function : Independent/Modified Independent             Mobility Comments: uses shopping cart for support in grocery store ADLs Comments: does not drive     Extremity/Trunk Assessment   Upper Extremity Assessment Upper Extremity Assessment: Overall WFL for tasks assessed    Lower Extremity Assessment Lower Extremity Assessment: Overall WFL for tasks assessed    Cervical / Trunk  Assessment Cervical / Trunk Assessment: Normal  Communication   Communication Communication: No apparent difficulties  Cognition Arousal: Alert Behavior During Therapy: Flat affect Overall Cognitive Status: Within Functional Limits for tasks assessed                                          General Comments General comments (skin integrity, edema,  etc.): VSS on RA    Exercises     Assessment/Plan    PT Assessment Patient needs continued PT services  PT Problem List Decreased balance;Decreased knowledge of use of DME;Decreased mobility;Decreased activity tolerance       PT Treatment Interventions DME instruction;Functional mobility training;Balance training;Patient/family education;Therapeutic activities;Gait training;Therapeutic exercise    PT Goals (Current goals can be found in the Care Plan section)  Acute Rehab PT Goals Patient Stated Goal: home PT Goal Formulation: With patient Time For Goal Achievement: 09/27/22 Potential to Achieve Goals: Good    Frequency Min 1X/week     Co-evaluation               AM-PAC PT "6 Clicks" Mobility  Outcome Measure Help needed turning from your back to your side while in a flat bed without using bedrails?: None Help needed moving from lying on your back to sitting on the side of a flat bed without using bedrails?: None Help needed moving to and from a bed to a chair (including a wheelchair)?: A Little Help needed standing up from a chair using your arms (e.g., wheelchair or bedside chair)?: A Little Help needed to walk in hospital room?: A Little Help needed climbing 3-5 steps with a railing? : A Little 6 Click Score: 20    End of Session Equipment Utilized During Treatment: Gait belt Activity Tolerance: Patient tolerated treatment well Patient left: in chair;with call bell/phone within reach;with chair alarm set Nurse Communication: Mobility status PT Visit Diagnosis: Difficulty in walking, not elsewhere classified (R26.2)    Time: 2956-2130 PT Time Calculation (min) (ACUTE ONLY): 25 min   Charges:   PT Evaluation $PT Eval Low Complexity: 1 Low PT Treatments $Gait Training: 8-22 mins PT General Charges $$ ACUTE PT VISIT: 1 Visit         Ferd Glassing., PT  Office # [phone]   Ilda Foil 09/13/2022, 8:47 AM

## 2022-09-13 NOTE — H&P (Addendum)
History and Physical    Patient: Stacy Campbell NFA:[id number] DOB: April 16, 1955 DOA: 09/12/2022 DOS: the patient was seen and examined on 09/13/2022 PCP: Willow Ora, MD  Patient coming from: home  Chief Complaint:  Chief Complaint  Patient presents with   Fall   Hip Pain   HPI: Stacy Campbell is a 67 y.o. female with medical history significant for persistent atrial fibrillation on Eliquis, hypertension, and type 2 diabetes mellitus who presents with multiple falls.  The patient had a fall earlier today.  She had 1 yesterday.  She had 1 last week.  The patient's daughter also reported that she was a little bit confused and restless.  In the last fall the patient got caught up in her sheets.  All of the falls were mechanical and without loss of consciousness.  The family also thinks the patient has been very sleepy and groggy over the last couple of weeks.  Approximately 4 weeks ago the patient started taking Ambien at night.  She took this in the past over a year ago.  She has been trying other medications since that time and none of them have worked so she was recently put back on the Ambien   Review of Systems: As mentioned in the history of present illness. All other systems reviewed and are negative. Past Medical History:  Diagnosis Date   Angio-edema    Anxiety    Arthritis    "lower back" (01/29/2017)   Asthma    Bone spur    spine   Chronic lower back pain    Cirrhosis of liver (HCC) 12/2018   Depression    Diabetes mellitus without complication (HCC)    Eczema    GERD (gastroesophageal reflux disease)    Hepatitis C    was treated for this   Hyperplastic polyp of intestine    Hypertension    Insomnia    OSA (obstructive sleep apnea)    Pending formal sleep study   Osteoarthritis    Osteoporosis    Paroxysmal atrial fibrillation (HCC) 09/27/2021   cardioversion in 1990s; Noted to be in Afib-RVR when being evaluated by sleep medicine.   Splenic artery  aneurysm (HCC)    Urticaria    Past Surgical History:  Procedure Laterality Date   CARDIOVERSION  1990s   CARDIOVERSION N/A 10/18/2021   Procedure: CARDIOVERSION;  Surgeon: Jake Bathe, MD;  Location: [facility];  Service: Cardiovascular;  Laterality: N/A;   CHOLECYSTECTOMY N/A 01/22/2018   Procedure: LAPAROSCOPIC CHOLECYSTECTOMY ERAS PATHWAY;  Surgeon: Berna Bue, MD;  Location: [facility] ORS;  Service: General;  Laterality: N/A;   CYSTOSCOPY W/ URETERAL STENT PLACEMENT Left 01/29/2017   Procedure: CYSTOSCOPY WITH RETROGRADE PYELOGRAM/URETERAL STENT PLACEMENT;  Surgeon: Crist Fat, MD;  Location: [facility];  Service: Urology;  Laterality: Left;   CYSTOSCOPY WITH RETROGRADE PYELOGRAM, URETEROSCOPY AND STENT PLACEMENT Left 01/29/2017   CYSTOSCOPY WITH RETROGRADE PYELOGRAM/URETERAL STENT PLACEMENT   NM MYOVIEW LTD  10/28/2017   Nuclear stress EF: 63%. The left ventricular ejection fraction is normal (55-65%). There was no ST segment deviation noted during stress. The study is normal.  This is a low risk study.   NM MYOVIEW LTD  11/06/2021   ?  Myocardial infarction located in the anterior region? ->  Despite this, there is a EF of 71% with no RWMA.  READ AS LOW RISK/LOW RISK; in fact, on review of images the anterior defect appeared to be consistent with breast attenuation.  No  evidence of ischemia or infarction.  Preserved EF 71%.   TRANSTHORACIC ECHOCARDIOGRAM  01/30/2017   Normal LV size and function-hyperdynamic/vigorous.  EF 65 to 70%. No RWMA.  Focal basal hypertrophy - Minimal LVOT gradient (2.6 m/s).  GRII DD.  Calcified AoV with no stenosis.  Mild MAC.  Trivial TR.  Peak PAP 55 mmHg.   Social History:  reports that she quit smoking about 14 years ago. Her smoking use included cigarettes. She started smoking about 51 years ago. She has a 9.3 pack-year smoking history. She has never used smokeless tobacco. She reports that she does not drink alcohol and does not use  drugs.  Allergies  Allergen Reactions   Hydrocodone Itching   Linalool    Methylisothiazolinone Other (See Comments)    Other reaction(s): Other (See Comments) Positive patch test Positive patch test    Propylene Glycol    Aspirin Itching and Other (See Comments)    Lower dose doesn't make her itch (she takes it every day)   Egg-Derived Products Nausea Only and Other (See Comments)    No reaction if in another food   Hydrocodone-Guaifenesin Rash    Family History  Problem Relation Age of Onset   Asthma Mother    Diabetes Mother    Asthma Father    Diabetes Father    Cancer Father        prostate   Eczema Father    Alcoholism Brother    Asthma Brother    Diabetes Brother    Asthma Brother    Diabetes Brother    Asthma Brother    Diabetes Brother    Asthma Brother    Diabetes Brother    Hepatitis B Daughter    Breast cancer Neg Hx    Sleep apnea Neg Hx     Prior to Admission medications   Medication Sig Start Date End Date Taking? Authorizing Provider  amLODipine (NORVASC) 10 MG tablet Take 10 mg by mouth daily. 12/15/21  Yes [provider]  apixaban (ELIQUIS) 5 MG TABS tablet TAKE 1 TABLET(5 MG) BY MOUTH TWICE DAILY Patient taking differently: Take 5 mg by mouth 2 (two) times daily. 04/24/22  Yes Marykay Lex, MD  ARIPiprazole (ABILIFY) 5 MG tablet Take 1 tablet (5 mg total) by mouth daily. 12/10/18  Yes Bland, Scott, DO  ARNUITY ELLIPTA 100 MCG/ACT AEPB INHALE 1 PUFF INTO THE LUNGS DAILY 10/25/21  Yes Willow Ora, MD  busPIRone (BUSPAR) 10 MG tablet Take 10 mg by mouth 3 (three) times daily. 10/02/21  Yes [provider]  carvedilol (COREG) 25 MG tablet TAKE 1 TABLET(25 MG) BY MOUTH TWICE DAILY WITH A MEAL 12/15/21  Yes Willow Ora, MD  cetirizine (ZYRTEC) 10 MG tablet Take by mouth. 09/28/21  Yes [provider]  desvenlafaxine (PRISTIQ) 50 MG 24 hr tablet Take 50 mg by mouth daily.   Yes [provider]  diltiazem  (CARDIZEM CD) 360 MG 24 hr capsule Take 1 capsule (360 mg total) by mouth daily. 10/30/21  Yes Marykay Lex, MD  doxepin (SINEQUAN) 25 MG capsule Take 25 mg by mouth 3 (three) times daily as needed.   Yes [provider]  empagliflozin (JARDIANCE) 10 MG TABS tablet TAKE 1 TABLET(10 MG) BY MOUTH DAILY 12/15/21  Yes Willow Ora, MD  fluconazole (DIFLUCAN) 200 MG tablet Take 200 mg by mouth daily. 08/20/22 08/20/23 Yes [provider]  furosemide (LASIX) 20 MG tablet Take 1 tablet by mouth daily  as needed for weight gain/swelling 05/11/22  Yes Marykay Lex, MD  lisinopril-hydrochlorothiazide (ZESTORETIC) 20-25 MG tablet Take 1 tablet by mouth daily. 12/15/21  Yes Willow Ora, MD  metFORMIN (GLUCOPHAGE-XR) 500 MG 24 hr tablet Take 1 tablet (500 mg total) by mouth daily with breakfast. 12/15/21  Yes Willow Ora, MD  mycophenolate (CELLCEPT) 500 MG tablet Take 2 tablets by mouth 2 (two) times daily. 08/14/21  Yes [provider]  oxyCODONE-acetaminophen (PERCOCET) 10-325 MG tablet Take 1 tablet by mouth 5 (five) times daily as needed. 07/04/21  Yes [provider]  potassium chloride (KLOR-CON) 10 MEQ tablet Take 1 tablet by mouth daily for 3 days then only when lasix taken 10/11/21  Yes Fenton, Clint R, PA  prazosin (MINIPRESS) 2 MG capsule Take 2 mg by mouth at bedtime. 09/28/21  Yes [provider]  REXULTI 2 MG TABS tablet Take 2 mg by mouth every morning. 06/01/22  Yes [provider]  rosuvastatin (CRESTOR) 10 MG tablet TAKE 1 TABLET(10 MG) BY MOUTH DAILY Patient taking differently: Take 10 mg by mouth daily. TAKE 1 TABLET(10 MG) BY MOUTH DAILY 01/29/22  Yes Willow Ora, MD  tirzepatide [facility]) 5 MG/0.5ML Pen Inject 5 mg into the skin once a week. 06/20/22  Yes Willow Ora, MD  triamcinolone ointment (KENALOG) 0.1 % Apply 1 application topically 2 (two) times daily.   Yes [provider]  Vitamin D, Ergocalciferol,  (DRISDOL) 1.25 MG (50000 UNIT) CAPS capsule Take 50,000 Units by mouth once a week. 09/05/22  Yes [provider]  zolpidem (AMBIEN CR) 12.5 MG CR tablet Take 12.5 mg by mouth at bedtime. 09/21/21  Yes [provider]  albuterol (VENTOLIN HFA) 108 (90 Base) MCG/ACT inhaler INHALE 2 PUFFS INTO THE LUNGS EVERY 6 HOURS AS NEEDED FOR WHEEZING OR SHORTNESS OF BREATH 06/05/22   Willow Ora, MD  glucose blood test strip Test blood sugar daily and record on blood sugar log 12/16/18   Marthenia Rolling, DO  Lancets (ACCU-CHEK SOFT TOUCH) lancets Check blood sugar daily and record in log 12/16/18   Marthenia Rolling, DO    Physical Exam: Vitals:   09/12/22 1930 09/12/22 2012 09/12/22 2140 09/13/22 0053  BP: (!) 147/39  128/63 (!) 102/48  Pulse: 91 100 98 92  Resp: 15 11    Temp:   97.9 F (36.6 C) 98.2 F (36.8 C)  TempSrc:   Oral Oral  SpO2: 100% 100% 100% 95%  Weight:      Height:       Physical Exam:  General: No acute distress, well developed, well nourished HEENT: Normocephalic, atraumatic, PERRL Cardiovascular: Normal rate and rhythm. Distal pulses intact. Pulmonary: Normal pulmonary effort, normal breath sounds Gastrointestinal: Nondistended abdomen, soft, non-tender, normoactive bowel sounds, no organomegaly Musculoskeletal:Normal ROM, no lower ext edema Skin: Skin is warm and dry. Neuro: No focal deficits noted, AAOx3. PSYCH: Attentive and cooperative  Data Reviewed:  Results for orders placed or performed during the hospital encounter of 09/12/22 (from the past 24 hour(s))  Urinalysis, Routine w reflex microscopic -Urine, Clean Catch     Status: Abnormal   Collection Time: 09/12/22 11:22 AM  Result Value Ref Range   Color, Urine YELLOW YELLOW   APPearance CLEAR CLEAR   Specific Gravity, Urine 1.014 1.005 - 1.030   pH 5.0 5.0 - 8.0   Glucose, UA 50 (A) NEGATIVE mg/dL   Hgb urine dipstick SMALL (A) NEGATIVE   Bilirubin Urine NEGATIVE NEGATIVE  Ketones, ur NEGATIVE  NEGATIVE mg/dL   Protein, ur NEGATIVE NEGATIVE mg/dL   Nitrite NEGATIVE NEGATIVE   Leukocytes,Ua NEGATIVE NEGATIVE   RBC / HPF 0-5 0 - 5 RBC/hpf   WBC, UA 0-5 0 - 5 WBC/hpf   Bacteria, UA NONE SEEN NONE SEEN   Squamous Epithelial / HPF 0-5 0 - 5 /HPF  CBC with Differential/Platelet     Status: Abnormal   Collection Time: 09/12/22 12:55 PM  Result Value Ref Range   WBC 6.7 4.0 - 10.5 K/uL   RBC 5.19 (H) 3.87 - 5.11 MIL/uL   Hemoglobin 13.1 12.0 - 15.0 g/dL   HCT 40.9 81.1 - 91.4 %   MCV 80.5 80.0 - 100.0 fL   MCH 25.2 (L) 26.0 - 34.0 pg   MCHC 31.3 30.0 - 36.0 g/dL   RDW 78.2 95.6 - 21.3 %   Platelets 212 150 - 400 K/uL   nRBC 0.0 0.0 - 0.2 %   Neutrophils Relative % 72 %   Neutro Abs 4.8 1.7 - 7.7 K/uL   Lymphocytes Relative 16 %   Lymphs Abs 1.1 0.7 - 4.0 K/uL   Monocytes Relative 9 %   Monocytes Absolute 0.6 0.1 - 1.0 K/uL   Eosinophils Relative 3 %   Eosinophils Absolute 0.2 0.0 - 0.5 K/uL   Basophils Relative 0 %   Basophils Absolute 0.0 0.0 - 0.1 K/uL   Immature Granulocytes 0 %   Abs Immature Granulocytes 0.01 0.00 - 0.07 K/uL  Basic metabolic panel     Status: Abnormal   Collection Time: 09/12/22  1:50 PM  Result Value Ref Range   Sodium 137 135 - 145 mmol/L   Potassium 4.3 3.5 - 5.1 mmol/L   Chloride 103 98 - 111 mmol/L   CO2 21 (L) 22 - 32 mmol/L   Glucose, Bld 96 70 - 99 mg/dL   BUN 51 (H) 8 - 23 mg/dL   Creatinine, Ser 0.86 (H) 0.44 - 1.00 mg/dL   Calcium 9.5 8.9 - 57.8 mg/dL   GFR, Estimated 20 (L) >60 mL/min   Anion gap 13 5 - 15    MRI Brain IMPRESSION: 1. No acute intracranial abnormality. 2. Remote right thalamic lacunar infarct, corresponding with abnormality on prior CT. 3. Underlying mild age-related cerebral atrophy with chronic small vessel ischemic disease.  Assessment and Plan: Multiple falls/ confusion - Suspect Ambien in the setting of AKI may be the cause.  Monitor patient off the medication.  MRI negative for stroke. Consider PT  because of falls.   2. AKI - IV fluids and monitor  3.  H/O Afib w rvr -  Continue Xarelto  4. H/o chronic back pain - Continue pain  medication    Advance Care Planning:   Code Status: Full Code she names her daughter at the bedside as her surrogate decision maker and wants to be full code.  Consults: None  Family Communication: The patient's daughter was at her bedside.  Severity of Illness: The appropriate patient status for this patient is INPATIENT. Inpatient status is judged to be reasonable and necessary in order to provide the required intensity of service to ensure the patient's safety. The patient's presenting symptoms, physical exam findings, and initial radiographic and laboratory data in the context of their chronic comorbidities is felt to place them at high risk for further clinical deterioration. Furthermore, it is not anticipated that the patient will be medically stable for discharge from the hospital within 2 midnights of admission.   *  I certify that at the point of admission it is my clinical judgment that the patient will require inpatient hospital care spanning beyond 2 midnights from the point of admission due to high intensity of service, high risk for further deterioration and high frequency of surveillance required.*  Author: Buena Irish, MD 09/13/2022 1:12 AM  For on call review [email or web address].

## 2022-09-14 DIAGNOSIS — E1169 Type 2 diabetes mellitus with other specified complication: Secondary | ICD-10-CM

## 2022-09-14 DIAGNOSIS — F339 Major depressive disorder, recurrent, unspecified: Secondary | ICD-10-CM

## 2022-09-14 DIAGNOSIS — D472 Monoclonal gammopathy: Secondary | ICD-10-CM

## 2022-09-14 DIAGNOSIS — N179 Acute kidney failure, unspecified: Secondary | ICD-10-CM | POA: Diagnosis not present

## 2022-09-14 DIAGNOSIS — K7469 Other cirrhosis of liver: Secondary | ICD-10-CM | POA: Diagnosis not present

## 2022-09-14 DIAGNOSIS — E785 Hyperlipidemia, unspecified: Secondary | ICD-10-CM

## 2022-09-14 DIAGNOSIS — L281 Prurigo nodularis: Secondary | ICD-10-CM

## 2022-09-14 DIAGNOSIS — J4489 Other specified chronic obstructive pulmonary disease: Secondary | ICD-10-CM

## 2022-09-14 DIAGNOSIS — F1120 Opioid dependence, uncomplicated: Secondary | ICD-10-CM | POA: Diagnosis not present

## 2022-09-14 LAB — BASIC METABOLIC PANEL
Anion gap: 9 (ref 5–15)
BUN: 20 mg/dL (ref 8–23)
CO2: 24 mmol/L (ref 22–32)
Calcium: 8.8 mg/dL — ABNORMAL LOW (ref 8.9–10.3)
Chloride: 108 mmol/L (ref 98–111)
Creatinine, Ser: 1.25 mg/dL — ABNORMAL HIGH (ref 0.44–1.00)
GFR, Estimated: 47 mL/min — ABNORMAL LOW (ref >60)
Glucose, Bld: 104 mg/dL — ABNORMAL HIGH (ref 70–99)
Potassium: 4.2 mmol/L (ref 3.5–5.1)
Sodium: 141 mmol/L (ref 135–145)

## 2022-09-14 LAB — GLUCOSE, CAPILLARY: Glucose-Capillary: 82 mg/dL (ref 70–99)

## 2022-09-14 NOTE — Discharge Summary (Signed)
Physician Discharge Summary   Patient: Stacy Campbell MRN: [id number] DOB: Aug 23, 1955  Admit date:     09/12/2022  Discharge date: 09/14/22  Discharge Physician: Alberteen Sam   PCP: Stacy Ora, MD     Recommendations at discharge:  FOllow up with PCP Dr. Mardelle Matte for confusion from Ambien and AKI Dr. Mardelle Matte: Please check creatinine in 1 week, discharge Cr 1.2 Please check BP and restart following as needed Amlodipine 10 Diltiazem 360 Jardiance 10 Lisinopril-HCT 20-25 Prazosin 2 PRN furosemide 20 Please review Abilify (had not filled in 1 yr per pharmacy records) and resume if needed Please screen for memory loss after the current episode has resolved and if concerns, refer for evaluation of dementia     Discharge Diagnoses: Principal Problem:   Acute kidney injury [facility]) Active Problems:   Morbid obesity   Major depression, recurrent, chronic (HCC)   Hypertension associated with diabetes (HCC)   Prurigo nodularis   Asthma with COPD (HCC)   Controlled type 2 diabetes mellitus without complication, without long-term current use of insulin (HCC)   Hyperlipidemia associated with type 2 diabetes mellitus (HCC)   Monoclonal gammopathy of unknown significance (MGUS)   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 F with MO, MGUS, depression, HTN, HLD, DM, pAF on Eliquis, asthma/COPD, chronic dermatitis on Cellcept, compensated cirrhosis and chronic pain with daily opiate use who presented with falls.  Found to have AKI.   AKI Baseline creatinine 0.8, was 2.5 on admission.  In the context of lisinopril use, multiple antihypertensives, soft blood pressure on admission.  Antihypertensives held, IV fluids given, creatinine improved to 1.2.   Acute metabolic encephalopathy Concern for memory loss Patient noted by family to have confusion  and forgetfulness.   Ambien held and patient reported feeling back to normal.  No obvious encephalopathy noted.  She has a medication list of over 20 medicines and is not supervised in administration.  She has several medicines (Eliquis, Ambien, oxycodone) which could be  dangerous if doses missed or taken in excess.  One could speculate her hypotension and AKI aren't also from mistaken overuse.  Patient was uncertain of her medicines, and pharmacy review detected that her Abilify and MMF hadn't been filled in 12 months, despite patient believing she was taking them. - Recommend SLUMS or MOCA after resolution of this illness and referral if abnormal   Depression On Rexulti.  Patient believes she is taking Abilify but pharmacy records imply she hasn't filled this in a year.  This medicine was stopped at d/c on the presumption she is not taking it.  Dermatitis Same with MMF, no refill hx in 1 year.  Medicine stopped on presumption she is not taking.  Hypertension BP low normal here off all her home medicines.  This improved with fluids, but her BP meds other than Coreg were held at d/c.  Recommend close follow up.          The [facility] Controlled Substances Registry was reviewed for this patient prior to discharge.  Consultants: None Procedures performed: None  Disposition: Home Diet recommendation:  Regular diet  DISCHARGE MEDICATION: Allergies as of 09/14/2022       Reactions   Hydrocodone Itching   Linalool    Methylisothiazolinone Other (See Comments)   Other reaction(s): Other (See Comments) Positive patch test  Positive patch test   Propylene Glycol    Aspirin Itching, Other (See Comments)   Lower dose doesn't make her itch (she takes it every day)   Egg-derived Products Nausea Only, Other (See Comments)   No reaction if in another food   Hydrocodone-guaifenesin Rash        Medication List     STOP taking these medications    amLODipine 10 MG  tablet Commonly known as: NORVASC   ARIPiprazole 5 MG tablet Commonly known as: ABILIFY   diltiazem 360 MG 24 hr capsule Commonly known as: Cardizem CD   doxepin 25 MG capsule Commonly known as: SINEQUAN   empagliflozin 10 MG Tabs tablet Commonly known as: Jardiance   furosemide 20 MG tablet Commonly known as: Lasix   lisinopril-hydrochlorothiazide 20-25 MG tablet Commonly known as: ZESTORETIC   mycophenolate 500 MG tablet Commonly known as: CELLCEPT   potassium chloride 10 MEQ tablet Commonly known as: KLOR-CON   prazosin 2 MG capsule Commonly known as: MINIPRESS   zolpidem 12.5 MG CR tablet Commonly known as: AMBIEN CR       TAKE these medications    accu-chek soft touch lancets Check blood sugar daily and record in log   albuterol 108 (90 Base) MCG/ACT inhaler Commonly known as: VENTOLIN HFA INHALE 2 PUFFS INTO THE LUNGS EVERY 6 HOURS AS NEEDED FOR WHEEZING OR SHORTNESS OF BREATH   Arnuity Ellipta 100 MCG/ACT Aepb Generic drug: Fluticasone Furoate INHALE 1 PUFF INTO THE LUNGS DAILY   busPIRone 10 MG tablet Commonly known as: BUSPAR Take 10 mg by mouth 3 (three) times daily.   carvedilol 25 MG tablet Commonly known as: COREG TAKE 1 TABLET(25 MG) BY MOUTH TWICE DAILY WITH A MEAL   cetirizine 10 MG tablet Commonly known as: ZYRTEC Take by mouth.   desvenlafaxine 50 MG 24 hr tablet Commonly known as: PRISTIQ Take 50 mg by mouth daily.   Eliquis 5 MG Tabs tablet Generic drug: apixaban TAKE 1 TABLET(5 MG) BY MOUTH TWICE DAILY What changed: See the new instructions.   fluconazole 200 MG tablet Commonly known as: DIFLUCAN Take 200 mg by mouth daily.   glucose blood test strip Test blood sugar daily and record on blood sugar log   metFORMIN 500 MG 24 hr tablet Commonly known as: GLUCOPHAGE-XR Take 1 tablet (500 mg total) by mouth daily with breakfast.   oxyCODONE-acetaminophen 10-325 MG tablet Commonly known as: PERCOCET Take 1 tablet by  mouth 5 (five) times daily as needed.   Rexulti 2 MG Tabs tablet Generic drug: brexpiprazole Take 2 mg by mouth every morning.   rosuvastatin 10 MG tablet Commonly known as: CRESTOR TAKE 1 TABLET(10 MG) BY MOUTH DAILY What changed: See the new instructions.   tirzepatide 5 MG/0.5ML Pen Commonly known as: MOUNJARO Inject 5 mg into the skin once a week.   triamcinolone ointment 0.1 % Commonly known as: KENALOG Apply 1 application topically 2 (two) times daily.   Vitamin D (Ergocalciferol) 1.25 MG (50000 UNIT) Caps capsule Commonly known as: DRISDOL Take 50,000 Units by mouth once a week.        Follow-up Information     Stacy Ora, MD. Schedule an appointment as soon as possible for a visit in 1 week(s).   Specialty: Family Medicine Contact information: [address] [place] [address] [phone]                 Discharge Instructions     Discharge instructions  Complete by: As directed    **IMPORTANT DISCHARGE INSTRUCTIONS**   From Dr. Maryfrances Bunnell: You were admitted for confusion, falls and found to have kidney problems.  Thankfully, the kidney problems improved with fluids and with holding your blood pressure medicines.    For your medicines: STOP zolpidem/Ambien and doxepin These are sleeping medicines, and they cause confusion and falls in the elderly Do not restart these medicines    For your blood pressure medicines: CONTINUE carvedilol/Coreg 25 mg twice daily  For now, HOLD (do not take) your: Amlodipine 10 mg Diltiazem 360 mg Lisinopril-hydrochlorothiazide 20-25 mg Furosemide 20 mg with potassium Prazosin 2 mg    DO NOT TAKE THESE for now Go see Dr. Mardelle Matte WITHIN 1 WEEK and ask her which you should restart  Drink plenty of fluids   ALSO: For now, based on pharmacy records, it seems you are doing really well without the aripiprazole/Abilify (this is a medicine for mood) and mycophenolate mofetil (this is for your  skin rash)  DO NOT TAKE THESE FOR NOW Go see Dr. Mardelle Matte and ask if you should restart  Get labs checked in 1 week Drink plenty of fluids   Increase activity slowly   Complete by: As directed        Discharge Exam: Filed Weights   09/12/22 1108  Weight: 99.3 kg    General: Pt is alert, awake, not in acute distress, sitting up in chair Cardiovascular: RRR, nl S1-S2, no murmurs appreciated.   No LE edema.   Respiratory: Normal respiratory rate and rhythm.  CTAB without rales or wheezes. Abdominal: Abdomen soft and non-tender.  No distension or HSM.   Neuro/Psych: Strength symmetric in upper and lower extremities.  Judgment and insight appear at baseline.   Condition at discharge: fair  The results of significant diagnostics from this hospitalization (including imaging, microbiology, ancillary and laboratory) are listed below for reference.   Imaging Studies: MR BRAIN WO CONTRAST  Result Date: 09/12/2022 CLINICAL DATA:  Initial evaluation for mental status change, unknown cause. EXAM: MRI HEAD WITHOUT CONTRAST TECHNIQUE: Multiplanar, multiecho pulse sequences of the brain and surrounding structures were obtained without intravenous contrast. COMPARISON:  CT from earlier the same day. FINDINGS: Brain: Mild age-related cerebral atrophy. Patchy T2/FLAIR hyperintensity involving the periventricular deep white matter both cerebral hemispheres, consistent with chronic small vessel ischemic disease, mild in nature. Remote lacunar infarct present at the right thalamus, corresponding with abnormality on prior CT. No evidence for acute or subacute ischemia. Gray-white matter differentiation maintained. No areas of chronic cortical infarction. No acute intracranial hemorrhage. Single punctate chronic microhemorrhage noted within the right thalamus. No mass lesion, midline shift or mass effect. No hydrocephalus or extra-axial fluid collection. Pituitary gland and suprasellar region within normal  limits. Vascular: Major intracranial vascular flow voids are maintained. Skull and upper cervical spine: Craniocervical junction within normal limits. Bone marrow signal intensity normal. No scalp soft tissue abnormality. Sinuses/Orbits: Globes and orbital soft tissues within normal limits. Paranasal sinuses are largely clear. No mastoid effusion. Other: None. IMPRESSION: 1. No acute intracranial abnormality. 2. Remote right thalamic lacunar infarct, corresponding with abnormality on prior CT. 3. Underlying mild age-related cerebral atrophy with chronic small vessel ischemic disease. Electronically Signed   By: Rise Mu M.D.   On: 09/12/2022 21:29   CT Renal Stone Study  Result Date: 09/12/2022 CLINICAL DATA:  Abdominal/flank pain. Stone suspected. Personal history of nephrolithiasis. Multiple falls. EXAM: CT ABDOMEN AND PELVIS WITHOUT CONTRAST TECHNIQUE: Multidetector CT imaging of the abdomen  and pelvis was performed following the standard protocol without IV contrast. RADIATION DOSE REDUCTION: This exam was performed according to the departmental dose-optimization program which includes automated exposure control, adjustment of the mA and/or kV according to patient size and/or use of iterative reconstruction technique. COMPARISON:  CT of the abdomen and pelvis with contrast 10/16/2017 at [facility]. FINDINGS: Lower chest: Mild atelectasis or scarring is present. Coronary artery calcifications are present. Hepatobiliary: No focal liver abnormality is seen. Status post cholecystectomy. No biliary dilatation. Pancreas: Unremarkable. No pancreatic ductal dilatation or surrounding inflammatory changes. Spleen: The spleen is enlarged. A new focal area of hypoattenuation is present in the superomedial aspect of the spleen measuring 2.0 cm. Adrenals/Urinary Tract: Adrenal glands are normal bilaterally. At least 4 nonobstructing stones are present in the right kidney. The largest measures  3 mm at the upper pole. 2 punctate nonobstructing stones are present in the left kidney. Ureters are within normal limits. The urinary bladder is normal. Stomach/Bowel: Stomach and duodenum are within normal limits. Small bowel is unremarkable. The terminal ileum is normal. Appendix is visualized and normal. The ascending and transverse colon are within normal limits. The descending and sigmoid colon are normal. Vascular/Lymphatic: Atherosclerotic calcifications are present in the aorta and branch vessels. No aneurysm is present. No significant adenopathy is present. Reproductive: Uterus and bilateral adnexa are unremarkable. Other: A fat containing left inguinal hernia is noted. No other significant ventral hernias are present. No free fluid or free air is present. Musculoskeletal: The vertebral body heights and AP alignment are normal. Endplate degenerative changes are greatest at L3-4, left greater than right. Interval progression is noted. No focal osseous lesions are present. The bony pelvis is normal apart from degenerative changes at the SI joints. The hips are located. No acute fractures are present. IMPRESSION: 1. No acute or focal lesion to explain the patient's symptoms. 2. Bilateral nonobstructing nephrolithiasis. 3. Splenomegaly. 4. New focal area of hypoattenuation in the superomedial aspect of the spleen measuring 2.0 cm. This is nonspecific, but could represent a splenic infarct. 5. Fat containing left inguinal hernia. 6. Coronary artery disease. 7.  Aortic Atherosclerosis (ICD10-I70.0). Electronically Signed   By: Marin Roberts M.D.   On: 09/12/2022 18:04   CT Head Wo Contrast  Result Date: 09/12/2022 CLINICAL DATA:  Multiple falls, pain EXAM: CT HEAD WITHOUT CONTRAST TECHNIQUE: Contiguous axial images were obtained from the base of the skull through the vertex without intravenous contrast. RADIATION DOSE REDUCTION: This exam was performed according to the departmental dose-optimization  program which includes automated exposure control, adjustment of the mA and/or kV according to patient size and/or use of iterative reconstruction technique. COMPARISON:  Brain MRI 04/05/2007 FINDINGS: Brain: There is no acute intracranial hemorrhage, extra-axial fluid collection, or acute territorial infarct. There is an age-indeterminate infarct in the right thalamus and possible additional age-indeterminate infarct in the posterior limb of the left internal capsule. Parenchymal volume is normal. The ventricles are normal in size. Gray-white differentiation is preserved The pituitary and suprasellar region are normal. There is no mass lesion. There is no mass effect or midline shift. Vascular: There is calcification of the bilateral carotid siphons. Skull: Normal. Negative for fracture or focal lesion. Sinuses/Orbits: The imaged paranasal sinuses are clear. The globes and orbits are unremarkable. Other: The mastoid air cells and middle ear cavities are clear. IMPRESSION: 1. 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 2. No acute intracranial  hemorrhage or calvarial fracture. Electronically Signed   By: Lesia Hausen M.D.   On: 09/12/2022 17:56    Microbiology: Results for orders placed or performed during the hospital encounter of 01/29/17  Urine Culture     Status: None   Collection Time: 01/29/17  8:01 PM   Specimen: Urine, Cystoscope  Result Value Ref Range Status   Specimen Description CYSTOSCOPY  Final   Special Requests NONE  Final   Culture NO GROWTH  Final   Report Status 01/31/2017 FINAL  Final    Labs: CBC: Recent Labs  Lab 09/12/22 1255 09/13/22 0916  WBC 6.7 5.1  NEUTROABS 4.8  --   HGB 13.1 12.4  HCT 41.8 39.7  MCV 80.5 78.0*  PLT 212 203   Basic Metabolic Panel: Recent Labs  Lab 09/12/22 1350 09/13/22 0916 09/14/22 0521  NA 137 138 141  K 4.3 4.0 4.2  CL 103 104  108  CO2 21* 23 24  GLUCOSE 96 105* 104*  BUN 51* 32* 20  CREATININE 2.58* 1.44* 1.25*  CALCIUM 9.5 9.1 8.8*  MG  --  2.0  --    Liver Function Tests: No results for input(s): "AST", "ALT", "ALKPHOS", "BILITOT", "PROT", "ALBUMIN" in the last 168 hours. CBG: Recent Labs  Lab 09/14/22 0836  GLUCAP 82    Discharge time spent: approximately 35 minutes spent on discharge counseling, evaluation of patient on day of discharge, and coordination of discharge planning with nursing, social work, pharmacy and case management  Signed: Alberteen Sam, MD [facility] 09/14/2022

## 2022-09-14 NOTE — Plan of Care (Signed)

## 2022-09-14 NOTE — TOC Transition Note (Signed)
Transition of Care [facility]) - CM/SW Discharge Note   Patient Details  Name: Stacy Campbell MRN: [id number] Date of Birth: 07-Sep-1955  [facility] of [facility]) CM/SW Contact:  Kermit Balo, RN Phone Number: 09/14/2022, 9:45 AM   Clinical Narrative:    Pt is discharging home with home health services through [facility]. Information on the AVS. [facility] will contact her for the first home visit. No DME needs.  Pt has transportation home via her son.    Final next level of care: Home w Home Health Services Barriers to Discharge: No Barriers Identified   Patient Goals and CMS Choice CMS Medicare.gov Compare Post Acute Care list provided to:: Patient    Discharge Placement                         Discharge Plan and Services Additional resources added to the After Visit Summary for                            [facility] Arranged: RN, Speech Therapy HH Agency: [facility] Date [facility] Agency Contacted: 09/14/22   Representative spoke with at [facility] Agency: Haywood Lasso  Social Determinants of Health (SDOH) Interventions SDOH Screenings   Food Insecurity: No Food Insecurity (09/12/2022)  Housing: Low Risk  (09/12/2022)  Transportation Needs: No Transportation Needs (09/12/2022)  Utilities: Not At Risk (09/12/2022)  Depression (PHQ2-9): High Risk (07/30/2022)  Financial Resource Strain: Low Risk  (07/28/2021)  Physical Activity: Insufficiently Active (07/28/2021)  Social Connections: Moderately Isolated (07/28/2021)  Stress: Stress Concern Present (07/28/2021)  Tobacco Use: Medium Risk (09/12/2022)     Readmission Risk Interventions     No data to display

## 2022-09-14 NOTE — Progress Notes (Signed)
Pt wheeled off unit by this RN discharge instructions reviewed and pt understands it all belongings are with pt.

## 2022-09-14 NOTE — Progress Notes (Signed)
   09/14/22 1127  Spiritual Encounters  Type of Visit Initial  Conversation partners present during encounter Nurse  Reason for visit Advance directives  OnCall Visit No   Chaplain responding to Spiritual Consult for ACD.  Chaplain arrived in room and Pt was just being discharged.

## 2022-09-14 NOTE — Care Management Important Message (Signed)
Important Message  Patient Details  Name: Stacy Campbell MRN: [id number] Date of Birth: 09-Jul-1955   Medicare Important Message Given:  Yes Patient left prior to IM delivery will mail a copy to the patient home address.     Emmah Bratcher 09/14/2022, 2:21 PM

## 2022-09-17 ENCOUNTER — Telehealth: Payer: Self-pay | Admitting: Family Medicine

## 2022-09-17 NOTE — Telephone Encounter (Signed)
Caller is Occupational psychologist, Human resources officer, from [facility]. States she called to let PCP know that patient's speech therapy evaluation was completed today. She informed me that a follow up will be needed for cognitive communications deficits. She ended by stating she will be faxing over the evaluation report along with some orders that need to be signed off on.   Almira Coaster can be reached @ [phone] for more information.

## 2022-09-19 ENCOUNTER — Telehealth: Payer: Self-pay | Admitting: Family Medicine

## 2022-09-19 NOTE — Telephone Encounter (Signed)
Home Health Verbal Orders  Agency:  [facility]  Caller:  Sonja  Contact and title  Requesting OT/ PT/ Skilled nursing/ Social Work/ Speech:    Reason for Request:  Returned home hospital last Sat, BP elevated, denying any signs - 184/104  [phone]

## 2022-09-20 ENCOUNTER — Other Ambulatory Visit: Payer: Self-pay | Admitting: Gastroenterology

## 2022-09-20 DIAGNOSIS — K7460 Unspecified cirrhosis of liver: Secondary | ICD-10-CM

## 2022-09-24 ENCOUNTER — Telehealth: Payer: Self-pay | Admitting: Cardiology

## 2022-09-24 NOTE — Telephone Encounter (Signed)
Patient procedure completed today Stacy Campbell cleared 05-08-22

## 2022-09-24 NOTE — Telephone Encounter (Signed)
Pre-operative Risk Assessment    Patient Name: Stacy Campbell  DOB: 25-Sep-1955 MRN: [id number]      Request for Surgical Clearance    Procedure:   endoscopy   Date of Surgery:  Clearance 09/24/22                                 Surgeon:  Dr. Willis Modena Surgeon's Group or Practice Name:  [facility] Phone number:  [phone] Fax number:  [phone]   Type of Clearance Requested:   - Medical    Type of Anesthesia:  MAC   Additional requests/questions:      SignedFilomena Jungling   09/24/2022, 12:13 PM

## 2022-09-24 NOTE — Telephone Encounter (Signed)
Name: Stacy Campbell  DOB: 1955/10/26  MRN: [id number]  Primary Cardiologist: Bryan Lemma, MD  Chart reviewed as part of pre-operative protocol coverage. Because of [name]'s past medical history and time since last visit, she will require a follow-up telephone visit in order to better assess preoperative cardiovascular risk.  Pre-op covering staff: - Please schedule appointment and call patient to inform them. If patient already had an upcoming appointment within acceptable timeframe, please add "pre-op clearance" to the appointment notes so provider is aware. - Please contact requesting surgeon's office via preferred method (i.e, phone, fax) to inform them of need for appointment prior to surgery.  Per office protocol, patient can hold Eliquis for 2 days prior to procedure.  Patient will not need bridging with Lovenox (enoxaparin) around procedure. Please resume when medically safe to do so.    Sharlene Dory, PA-C  09/24/2022, 1:42 PM

## 2022-09-24 NOTE — Telephone Encounter (Signed)
Patient with diagnosis of atrial fibrillation on Eliquis for anticoagulation.    Procedure: endoscopy Date of procedure: 09/24/22   CHA2DS2-VASc Score = 4   This indicates a 4.8% annual risk of stroke. The patient's score is based upon: CHF History: 0 HTN History: 1 Diabetes History: 1 Stroke History: 0 Vascular Disease History: 0 Age Score: 1 Gender Score: 1   CrCl 72 Platelet count 203  Per office protocol, patient can hold Eliquis for 2 days prior to procedure.   Patient will not need bridging with Lovenox (enoxaparin) around procedure.  **This guidance is not considered finalized until pre-operative APP has relayed final recommendations.**

## 2022-09-26 ENCOUNTER — Ambulatory Visit
Admission: RE | Admit: 2022-09-26 | Discharge: 2022-09-26 | Disposition: A | Discharge status: Home / Self Care | Payer: Medicare HMO | Source: Ambulatory Visit | Attending: Gastroenterology | Admitting: Gastroenterology

## 2022-09-26 ENCOUNTER — Telehealth: Payer: Self-pay | Admitting: Family Medicine

## 2022-09-26 ENCOUNTER — Other Ambulatory Visit: Payer: Self-pay | Admitting: Gastroenterology

## 2022-09-26 DIAGNOSIS — K7460 Unspecified cirrhosis of liver: Secondary | ICD-10-CM

## 2022-09-26 NOTE — Telephone Encounter (Signed)
Patient dropped off document Home Health Certificate (Order ID [id number]), to be filled out by provider. Patient requested to send it back via Fax within 5-days. Document is located in providers tray at front office.Please advise

## 2022-09-27 ENCOUNTER — Encounter: Payer: Self-pay | Admitting: Gastroenterology

## 2022-09-27 NOTE — Telephone Encounter (Signed)
Form has been placed in providers box

## 2022-10-01 ENCOUNTER — Ambulatory Visit (INDEPENDENT_AMBULATORY_CARE_PROVIDER_SITE_OTHER): Payer: Medicare HMO | Admitting: Family Medicine

## 2022-10-01 ENCOUNTER — Encounter: Payer: Self-pay | Admitting: Family Medicine

## 2022-10-01 VITALS — BP 124/60 | HR 73 | Temp 98.9°F | Ht 64.0 in | Wt 213.8 lb

## 2022-10-01 DIAGNOSIS — Z23 Encounter for immunization: Secondary | ICD-10-CM

## 2022-10-01 DIAGNOSIS — D649 Anemia, unspecified: Secondary | ICD-10-CM

## 2022-10-01 DIAGNOSIS — I152 Hypertension secondary to endocrine disorders: Secondary | ICD-10-CM | POA: Diagnosis not present

## 2022-10-01 DIAGNOSIS — E559 Vitamin D deficiency, unspecified: Secondary | ICD-10-CM

## 2022-10-01 DIAGNOSIS — E1169 Type 2 diabetes mellitus with other specified complication: Secondary | ICD-10-CM

## 2022-10-01 DIAGNOSIS — Z7984 Long term (current) use of oral hypoglycemic drugs: Secondary | ICD-10-CM

## 2022-10-01 DIAGNOSIS — R410 Disorientation, unspecified: Secondary | ICD-10-CM

## 2022-10-01 DIAGNOSIS — E119 Type 2 diabetes mellitus without complications: Secondary | ICD-10-CM | POA: Diagnosis not present

## 2022-10-01 DIAGNOSIS — D472 Monoclonal gammopathy: Secondary | ICD-10-CM

## 2022-10-01 DIAGNOSIS — Z79899 Other long term (current) drug therapy: Secondary | ICD-10-CM

## 2022-10-01 DIAGNOSIS — Z6841 Body Mass Index (BMI) 40.0 and over, adult: Secondary | ICD-10-CM

## 2022-10-01 DIAGNOSIS — N179 Acute kidney failure, unspecified: Secondary | ICD-10-CM

## 2022-10-01 DIAGNOSIS — K7469 Other cirrhosis of liver: Secondary | ICD-10-CM

## 2022-10-01 DIAGNOSIS — G9341 Metabolic encephalopathy: Secondary | ICD-10-CM | POA: Diagnosis not present

## 2022-10-01 DIAGNOSIS — I4819 Other persistent atrial fibrillation: Secondary | ICD-10-CM

## 2022-10-01 DIAGNOSIS — Z7985 Long-term (current) use of injectable non-insulin antidiabetic drugs: Secondary | ICD-10-CM

## 2022-10-01 DIAGNOSIS — E1159 Type 2 diabetes mellitus with other circulatory complications: Secondary | ICD-10-CM | POA: Diagnosis not present

## 2022-10-01 DIAGNOSIS — K7460 Unspecified cirrhosis of liver: Secondary | ICD-10-CM

## 2022-10-01 DIAGNOSIS — Z9181 History of falling: Secondary | ICD-10-CM

## 2022-10-01 MED ORDER — APIXABAN 5 MG PO TABS
5.0000 mg | ORAL_TABLET | Freq: Two times a day (BID) | ORAL | Status: DC
Start: 2022-10-01 — End: 2022-10-15

## 2022-10-01 NOTE — Progress Notes (Signed)
Subjective  CC:  Chief Complaint  Patient presents with   Hospitalization Follow-up    09/12/2022 - 09/14/2022 (2 days) [facility]    Acute kidney injury    HPI: Stacy Campbell is a 67 y.o. female who presents to the office today to address the problems listed above in the chief complaint. Reviewed hospitalization: Altered mental status.  Found to be in acute kidney failure.  Patient was on multiple medications, managed by multiple specialist.  Altered mental status found to be related to Ambien and acute kidney dysfunction but also was on prazosin and Sinequan.  Was hypotensive during much of her stay but this resolved after stopping medications.  Since being home blood pressures have been stable. Patient is here with her daughter: Both report that she is back to her baseline.  No further memory issues.  Sleep continues to be a problem.  We reviewed her medication list.  Discussed multiple medications that were stopped.  Recommendations at discharge:  FOllow up with PCP Dr. Mardelle Matte for confusion from Ambien and AKI Dr. Mardelle Matte: Please check creatinine in 1 week, discharge Cr 1.2 Please check BP and restart following as needed Amlodipine 10 Diltiazem 360 Jardiance 10 Lisinopril-HCT 20-25 Prazosin 2 PRN furosemide 20 Please review Abilify (had not filled in 1 yr per pharmacy records) and resume if needed Please screen for memory loss after the current episode has resolved and if concerns, refer for evaluation of dementia Assessment  1. Disorientation   2. Need for influenza vaccination   3. Persistent atrial fibrillation (HCC)   4. Other cirrhosis of liver (HCC)   5. Controlled type 2 diabetes mellitus without complication, without long-term current use of insulin (HCC)   6. Polypharmacy   7. AKI (acute kidney injury) (HCC)   8. Vitamin D deficiency   9. Anemia, unspecified type      Plan  Altered mental status due to polypharmacy and acute kidney injury:  Will recheck kidney function today.  Discussed risks of polypharmacy. Hypertension now well-controlled on beta-blocker.  She does have persistent A-fib and is worried about her Cardizem being stopped.  I recommend her seeing her cardiologist for further follow-up regarding this.  Continue anticoagulation. Recheck vitamin D levels: [facility] who manages her chronic pain has put her on high-dose vitamin D.  This will need to be followed Chronic narcotic use: High risk for altered mental status and problems. Anemia: Needs follow-up.  Patient reports that liver doctor found recent anemia and endoscopy was done.  Dr. Dulce Sellar  Follow up: As scheduled for recheck Flu shot given today 12/20/2022  Orders Placed This Encounter  Procedures   Flu Vaccine Trivalent High Dose (Fluad)   Basic metabolic panel   Hemoglobin A1c   Iron, TIBC and Ferritin Panel   VITAMIN D 25 Hydroxy (Vit-D Deficiency, Fractures)   Vitamin B12   Meds ordered this encounter  Medications   apixaban (ELIQUIS) 5 MG TABS tablet    Sig: Take 1 tablet (5 mg total) by mouth 2 (two) times daily.      I reviewed the patients updated PMH, FH, and SocHx.    Patient Active Problem List   Diagnosis Date Noted   Persistent atrial fibrillation (HCC) 10/04/2021    Priority: High   Secondary hypercoagulable state (HCC) 10/04/2021    Priority: High   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 01/04/2020    Priority: High   Monoclonal gammopathy of unknown significance (MGUS) 01/23/2019  Priority: High   Controlled type 2 diabetes mellitus without complication, without long-term current use of insulin (HCC) 12/12/2018    Priority: High   Hyperlipidemia associated with type 2 diabetes mellitus (HCC) 12/12/2018    Priority: High   Asthma with COPD (HCC) 05/25/2013    Priority: High   Morbid obesity     Priority: High   Major depression, recurrent, chronic (HCC) 03/07/2006    Priority: High   Hypertension associated with  diabetes (HCC) 03/07/2006    Priority: High   Chronic low back pain 03/07/2006    Priority: High   Insomnia 03/07/2006    Priority: High   Aortic ejection murmur 12/06/2021    Priority: Medium    DJD (degenerative joint disease) of cervical spine 09/06/2021    Priority: Medium    Chronic urticaria 06/12/2021    Priority: Medium    Degenerative joint disease (DJD) of lumbar spine 01/04/2020    Priority: Medium    History of hepatitis C 10/11/2017    Priority: Medium    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    Priority: Low   Acute kidney injury (HCC) 09/12/2022   Contusion of left great toe with damage to nail 06/24/2022   Mixed simple and mucopurulent chronic bronchitis (HCC) 06/20/2022   Chronic narcotic dependence (HCC) 06/20/2022   Polypharmacy 06/20/2022   Sleep apnea 06/20/2022   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   carvedilol (COREG) 25 MG tablet TAKE 1 TABLET(25 MG) BY MOUTH TWICE DAILY WITH A MEAL   cetirizine (ZYRTEC) 10 MG tablet Take by mouth.   desvenlafaxine (PRISTIQ) 50 MG 24 hr tablet Take 50 mg by mouth daily.   fluconazole (DIFLUCAN) 200 MG tablet Take 200 mg by mouth daily.   glucose blood test strip Test blood sugar daily and record on blood sugar log   Lancets (ACCU-CHEK SOFT TOUCH) lancets Check blood sugar daily and record in log   metFORMIN  (GLUCOPHAGE-XR) 500 MG 24 hr tablet Take 1 tablet (500 mg total) by mouth daily with breakfast.   oxyCODONE-acetaminophen (PERCOCET) 10-325 MG tablet Take 1 tablet by mouth 5 (five) times daily as needed.   REXULTI 2 MG TABS tablet Take 2 mg by mouth every morning.   rosuvastatin (CRESTOR) 10 MG tablet TAKE 1 TABLET(10 MG) BY MOUTH DAILY (Patient taking differently: Take 10 mg by mouth daily. TAKE 1 TABLET(10 MG) BY MOUTH DAILY)   tirzepatide (MOUNJARO) 5 MG/0.5ML Pen Inject 5 mg into the skin once a week.   triamcinolone ointment (KENALOG) 0.1 % Apply 1 application topically 2 (two) times daily.   Vitamin D, Ergocalciferol, (DRISDOL) 1.25 MG (50000 UNIT) CAPS capsule Take 50,000 Units by mouth once a week.   [DISCONTINUED] apixaban (ELIQUIS) 5 MG TABS tablet TAKE 1 TABLET(5 MG) BY MOUTH TWICE DAILY (Patient taking  differently: Take 5 mg by mouth 2 (two) times daily.)    Allergies: Patient is allergic to hydrocodone, linalool, methylisothiazolinone, propylene glycol, aspirin, egg-derived products, and hydrocodone-guaifenesin. Family History: Patient family history includes Alcoholism in her brother; Asthma in her brother, brother, brother, brother, father, and mother; Cancer in her father; Diabetes in her brother, brother, brother, brother, father, and mother; Eczema in her father; Hepatitis B in her daughter. Social History:  Patient  reports that she quit smoking about 14 years ago. Her smoking use included cigarettes. She started smoking about 51 years ago. She has a 9.3 pack-year smoking history. She has never used smokeless tobacco. She reports that she does not drink alcohol and does not use drugs.  Review of Systems: Constitutional: Negative for fever malaise or anorexia Cardiovascular: negative for chest pain Respiratory: negative for SOB or persistent cough Gastrointestinal: negative for abdominal pain  Objective  Vitals: BP 124/60   Pulse 73   Temp 98.9 F (37.2 C)   Ht 5\' 4"   (1.626 m)   Wt 213 lb 12.8 oz (97 kg)   SpO2 98%   BMI 36.70 kg/m  General: no acute distress , A&Ox3, appears back to baseline HEENT: PEERL, conjunctiva normal, neck is supple Cardiovascular: Irregularly irregular with murmur Respiratory:  Good breath sounds bilaterally, CTAB with normal respiratory effort Skin:  Warm, improved  Commons side effects, risks, benefits, and alternatives for medications and treatment plan prescribed today were discussed, and the patient expressed understanding of the given instructions. Patient is instructed to call or message via MyChart if he/she has any questions or concerns regarding our treatment plan. No barriers to understanding were identified. We discussed Red Flag symptoms and signs in detail. Patient expressed understanding regarding what to do in case of urgent or emergency type symptoms.  Medication list was reconciled, printed and provided to the patient in AVS. Patient instructions and summary information was reviewed with the patient as documented in the AVS. This note was prepared with assistance of Dragon voice recognition software. Occasional wrong-word or sound-a-like substitutions may have occurred due to the inherent limitations of voice recognition software

## 2022-10-02 LAB — BASIC METABOLIC PANEL
BUN: 19 mg/dL (ref 6–23)
CO2: 25 mEq/L (ref 19–32)
Calcium: 9.5 mg/dL (ref 8.4–10.5)
Chloride: 104 mEq/L (ref 96–112)
Creatinine, Ser: 1.14 mg/dL (ref 0.40–1.20)
GFR: 49.92 mL/min — ABNORMAL LOW (ref >60.00)
Glucose, Bld: 72 mg/dL (ref 70–99)
Potassium: 3.3 mEq/L — ABNORMAL LOW (ref 3.5–5.1)
Sodium: 142 mEq/L (ref 135–145)

## 2022-10-02 LAB — IRON,TIBC AND FERRITIN PANEL
%SAT: 20 % (calc) (ref 16–45)
Ferritin: 69 ng/mL (ref 16–288)
Iron: 69 ug/dL (ref 45–160)
TIBC: 340 mcg/dL (calc) (ref 250–450)

## 2022-10-02 LAB — VITAMIN B12: Vitamin B-12: 306 pg/mL (ref 211–911)

## 2022-10-02 LAB — VITAMIN D 25 HYDROXY (VIT D DEFICIENCY, FRACTURES): VITD: 43.38 ng/mL (ref 30.00–100.00)

## 2022-10-02 LAB — HEMOGLOBIN A1C: Hgb A1c MFr Bld: 6.3 % (ref 4.6–6.5)

## 2022-10-04 NOTE — Progress Notes (Signed)
Please call patient: Please tell patient that her kidney function has returned to near normal.  This is good news.  Her potassium is a little bit low, please order potassium chloride 20 mEq tablets, take 1 tablet daily for 3 days.  #3 without refills.  Her iron levels look good.  I do think she should be taking vitamin B12 1000 mg over-the-counter daily as well.  Follow-up as discussed

## 2022-10-05 ENCOUNTER — Other Ambulatory Visit: Payer: Self-pay

## 2022-10-05 DIAGNOSIS — E876 Hypokalemia: Secondary | ICD-10-CM

## 2022-10-05 MED ORDER — POTASSIUM CHLORIDE CRYS ER 20 MEQ PO TBCR
20.0000 meq | EXTENDED_RELEASE_TABLET | Freq: Every day | ORAL | 0 refills | Status: DC
Start: 2022-10-05 — End: 2022-12-20

## 2022-10-11 ENCOUNTER — Ambulatory Visit
Admission: RE | Admit: 2022-10-11 | Discharge: 2022-10-11 | Disposition: A | Discharge status: Home / Self Care | Payer: Medicare HMO | Source: Ambulatory Visit | Attending: Gastroenterology | Admitting: Gastroenterology

## 2022-10-11 DIAGNOSIS — K7460 Unspecified cirrhosis of liver: Secondary | ICD-10-CM

## 2022-10-11 MED ORDER — IOPAMIDOL (ISOVUE-370) INJECTION 76%
200.0000 mL | Freq: Once | INTRAVENOUS | Status: AC | PRN
  Administered 2022-10-11: 100 mL via INTRAVENOUS

## 2022-10-15 ENCOUNTER — Other Ambulatory Visit: Payer: Self-pay | Admitting: Cardiology

## 2022-10-15 DIAGNOSIS — I4819 Other persistent atrial fibrillation: Secondary | ICD-10-CM

## 2022-10-15 NOTE — Telephone Encounter (Signed)
Prescription refill request for Eliquis received. Indication: AF Last office visit: 05/08/22  Ranae Palms MD Scr: 1.14 on 10/01/22  Epic Age: 67 Weight: 107kg  Based on above findings Eliquis 5mg  twice daily is the appropriate dose.  Refill approved.

## 2022-10-22 ENCOUNTER — Encounter (INDEPENDENT_AMBULATORY_CARE_PROVIDER_SITE_OTHER): Payer: Medicare HMO | Admitting: Podiatry

## 2022-10-22 DIAGNOSIS — Z91199 Patient's noncompliance with other medical treatment and regimen due to unspecified reason: Secondary | ICD-10-CM

## 2022-10-22 NOTE — Progress Notes (Signed)
Patient was a no-show for today's scheduled appointment.

## 2022-10-31 ENCOUNTER — Other Ambulatory Visit: Payer: Self-pay

## 2022-10-31 ENCOUNTER — Telehealth: Payer: Self-pay | Admitting: Family Medicine

## 2022-10-31 DIAGNOSIS — E876 Hypokalemia: Secondary | ICD-10-CM

## 2022-10-31 NOTE — Telephone Encounter (Signed)
Spoke with pt regarding lab results/recommendations 

## 2022-10-31 NOTE — Telephone Encounter (Signed)
Patient called requesting call back to explain vitamin D results from September.

## 2022-11-06 ENCOUNTER — Encounter: Payer: Self-pay | Admitting: Podiatry

## 2022-11-06 ENCOUNTER — Ambulatory Visit (INDEPENDENT_AMBULATORY_CARE_PROVIDER_SITE_OTHER): Payer: Medicare HMO | Admitting: Podiatry

## 2022-11-06 DIAGNOSIS — B351 Tinea unguium: Secondary | ICD-10-CM

## 2022-11-06 DIAGNOSIS — M79675 Pain in left toe(s): Secondary | ICD-10-CM

## 2022-11-06 DIAGNOSIS — M79674 Pain in right toe(s): Secondary | ICD-10-CM | POA: Diagnosis not present

## 2022-11-06 NOTE — Progress Notes (Signed)
Subjective:  Patient ID: Stacy Campbell, female    DOB: [birth year],  MRN: [id number]  Stacy Campbell presents to clinic today for:  Chief Complaint  Patient presents with   [facility]    RFC patient has no concerns   Patient notes nails are thick, discolored, elongated and painful in shoegear when trying to ambulate.  States eczema has been flaring up and she's had to take oral steroids, which is causing her A1c to elevate.  PCP is Willow Ora, MD.  Past Medical History:  Diagnosis Date   Angio-edema    Anxiety    Arthritis    "lower back" (01/29/2017)   Asthma    Bone spur    spine   Chronic lower back pain    Cirrhosis of liver (HCC) 12/2018   Depression    Diabetes mellitus without complication (HCC)    Eczema    GERD (gastroesophageal reflux disease)    Hepatitis C    was treated for this   Hyperplastic polyp of intestine    Hypertension    Insomnia    OSA (obstructive sleep apnea)    Pending formal sleep study   Osteoarthritis    Osteoporosis    Paroxysmal atrial fibrillation (HCC) 09/27/2021   cardioversion in 1990s; Noted to be in Afib-RVR when being evaluated by sleep medicine.   Splenic artery aneurysm (HCC)    Urticaria     Past Surgical History:  Procedure Laterality Date   CARDIOVERSION  1990s   CARDIOVERSION N/A 10/18/2021   Procedure: CARDIOVERSION;  Surgeon: Jake Bathe, MD;  Location: [facility] ENDOSCOPY;  Service: Cardiovascular;  Laterality: N/A;   CHOLECYSTECTOMY N/A 01/22/2018   Procedure: LAPAROSCOPIC CHOLECYSTECTOMY ERAS PATHWAY;  Surgeon: Berna Bue, MD;  Location: [facility] ORS;  Service: General;  Laterality: N/A;   CYSTOSCOPY W/ URETERAL STENT PLACEMENT Left 01/29/2017   Procedure: CYSTOSCOPY WITH RETROGRADE PYELOGRAM/URETERAL STENT PLACEMENT;  Surgeon: Crist Fat, MD;  Location: [facility] OR;  Service: Urology;  Laterality: Left;   CYSTOSCOPY WITH RETROGRADE PYELOGRAM, URETEROSCOPY AND STENT PLACEMENT Left 01/29/2017    CYSTOSCOPY WITH RETROGRADE PYELOGRAM/URETERAL STENT PLACEMENT   NM MYOVIEW LTD  10/28/2017   Nuclear stress EF: 63%. The left ventricular ejection fraction is normal (55-65%). There was no ST segment deviation noted during stress. The study is normal.  This is a low risk study.   NM MYOVIEW LTD  11/06/2021   ?  Myocardial infarction located in the anterior region? ->  Despite this, there is a EF of 71% with no RWMA.  READ AS LOW RISK/LOW RISK; in fact, on review of images the anterior defect appeared to be consistent with breast attenuation.  No evidence of ischemia or infarction.  Preserved EF 71%.   TRANSTHORACIC ECHOCARDIOGRAM  01/30/2017   Normal LV size and function-hyperdynamic/vigorous.  EF 65 to 70%. No RWMA.  Focal basal hypertrophy - Minimal LVOT gradient (2.6 m/s).  GRII DD.  Calcified AoV with no stenosis.  Mild MAC.  Trivial TR.  Peak PAP 55 mmHg.    Allergies  Allergen Reactions   Hydrocodone Itching   Linalool    Methylisothiazolinone Other (See Comments)    Other reaction(s): Other (See Comments) Positive patch test Positive patch test    Propylene Glycol    Aspirin Itching and Other (See Comments)    Lower dose doesn't make her itch (she takes it every day)   Egg-Derived Products Nausea Only and Other (See Comments)  No reaction if in another food   Hydrocodone-Guaifenesin Rash    Review of Systems: Negative except as noted in the HPI.  Objective:  Stacy Campbell is a pleasant 67 y.o. female in NAD. AAO x 3.  Vascular Examination: Capillary refill time is 3-5 seconds to toes bilateral. Palpable pedal pulses b/l LE. Digital hair present b/l.  Skin temperature gradient WNL b/l. No varicosities b/l. No cyanosis noted b/l. Minimal generalized edema bilateral legs/ankles.  Dermatological Examination: Pedal skin with normal turgor, texture and tone b/l. No open wounds. No interdigital macerations b/l. Toenails x10 are 3mm thick, discolored, dystrophic with  subungual debris. There is pain with compression of the nail plates.  They are elongated x10     Latest Ref Rng & Units 10/01/2022    3:42 PM 06/20/2022   10:53 AM 12/15/2021   10:45 AM  Hemoglobin A1C  Hemoglobin-A1c 4.6 - 6.5 % 6.3  6.6  6.7    Assessment/Plan: 1. Pain due to onychomycosis of toenails of both feet    The mycotic toenails were sharply debrided x10 with sterile nail nippers and a power debriding burr to decrease bulk/thickness and length.    Return in about 3 months (around 02/06/2023) for [facility].   Clerance Lav, DPM, FACFAS [facility]     [address] [place], [place] [address]                Office [phone]  Fax [phone]

## 2022-12-20 ENCOUNTER — Encounter: Payer: Self-pay | Admitting: Family Medicine

## 2022-12-20 ENCOUNTER — Ambulatory Visit: Payer: Medicare HMO | Admitting: Family Medicine

## 2022-12-20 VITALS — BP 136/70 | HR 71 | Temp 98.1°F | Ht 64.0 in | Wt 218.0 lb

## 2022-12-20 DIAGNOSIS — D6869 Other thrombophilia: Secondary | ICD-10-CM | POA: Diagnosis not present

## 2022-12-20 DIAGNOSIS — Z7985 Long-term (current) use of injectable non-insulin antidiabetic drugs: Secondary | ICD-10-CM | POA: Diagnosis not present

## 2022-12-20 DIAGNOSIS — D472 Monoclonal gammopathy: Secondary | ICD-10-CM | POA: Diagnosis not present

## 2022-12-20 DIAGNOSIS — E1159 Type 2 diabetes mellitus with other circulatory complications: Secondary | ICD-10-CM

## 2022-12-20 DIAGNOSIS — F339 Major depressive disorder, recurrent, unspecified: Secondary | ICD-10-CM

## 2022-12-20 DIAGNOSIS — I4819 Other persistent atrial fibrillation: Secondary | ICD-10-CM | POA: Diagnosis not present

## 2022-12-20 DIAGNOSIS — E119 Type 2 diabetes mellitus without complications: Secondary | ICD-10-CM

## 2022-12-20 DIAGNOSIS — F1120 Opioid dependence, uncomplicated: Secondary | ICD-10-CM

## 2022-12-20 DIAGNOSIS — T310 Burns involving less than 10% of body surface: Secondary | ICD-10-CM

## 2022-12-20 DIAGNOSIS — G8929 Other chronic pain: Secondary | ICD-10-CM

## 2022-12-20 DIAGNOSIS — I152 Hypertension secondary to endocrine disorders: Secondary | ICD-10-CM

## 2022-12-20 DIAGNOSIS — M5441 Lumbago with sciatica, right side: Secondary | ICD-10-CM

## 2022-12-20 DIAGNOSIS — M5442 Lumbago with sciatica, left side: Secondary | ICD-10-CM

## 2022-12-20 LAB — POCT GLYCOSYLATED HEMOGLOBIN (HGB A1C): Hemoglobin A1C: 5.3 % (ref 4.0–5.6)

## 2022-12-20 MED ORDER — TIRZEPATIDE 7.5 MG/0.5ML ~~LOC~~ SOAJ: 7.5000 mg | SUBCUTANEOUS | 2 refills | Status: DC

## 2022-12-20 MED ORDER — DOXYCYCLINE HYCLATE 100 MG PO TABS: 100.0000 mg | ORAL_TABLET | Freq: Two times a day (BID) | ORAL | 0 refills | Status: DC
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
## 2023-01-14 ENCOUNTER — Ambulatory Visit: Payer: Self-pay | Admitting: Family Medicine
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
## 2023-01-16 ENCOUNTER — Ambulatory Visit: Payer: Medicare HMO | Admitting: Family Medicine

## 2023-01-16 ENCOUNTER — Encounter: Payer: Self-pay | Admitting: Family Medicine

## 2023-01-16 VITALS — BP 136/60 | HR 86 | Temp 97.8°F | Ht 64.0 in | Wt 223.2 lb

## 2023-01-16 DIAGNOSIS — L301 Dyshidrosis [pompholyx]: Secondary | ICD-10-CM | POA: Diagnosis not present

## 2023-01-16 DIAGNOSIS — M7062 Trochanteric bursitis, left hip: Secondary | ICD-10-CM | POA: Diagnosis not present

## 2023-01-16 DIAGNOSIS — R600 Localized edema: Secondary | ICD-10-CM | POA: Diagnosis not present

## 2023-01-16 DIAGNOSIS — K7469 Other cirrhosis of liver: Secondary | ICD-10-CM

## 2023-01-16 DIAGNOSIS — I152 Hypertension secondary to endocrine disorders: Secondary | ICD-10-CM

## 2023-01-16 DIAGNOSIS — E1159 Type 2 diabetes mellitus with other circulatory complications: Secondary | ICD-10-CM

## 2023-01-16 DIAGNOSIS — Z6841 Body Mass Index (BMI) 40.0 and over, adult: Secondary | ICD-10-CM

## 2023-01-16 LAB — CBC WITH DIFFERENTIAL/PLATELET
Basophils Absolute: 0 10*3/uL (ref 0.0–0.1)
Basophils Relative: 0.6 % (ref 0.0–3.0)
Eosinophils Absolute: 0.4 10*3/uL (ref 0.0–0.7)
Eosinophils Relative: 6.8 % — ABNORMAL HIGH (ref 0.0–5.0)
HCT: 41.9 % (ref 36.0–46.0)
Hemoglobin: 13.4 g/dL (ref 12.0–15.0)
Lymphocytes Relative: 18.9 % (ref 12.0–46.0)
Lymphs Abs: 1.2 10*3/uL (ref 0.7–4.0)
MCHC: 32 g/dL (ref 30.0–36.0)
MCV: 78.1 fL (ref 78.0–100.0)
Monocytes Absolute: 0.7 10*3/uL (ref 0.1–1.0)
Monocytes Relative: 10.3 % (ref 3.0–12.0)
Neutro Abs: 4.1 10*3/uL (ref 1.4–7.7)
Neutrophils Relative %: 63.4 % (ref 43.0–77.0)
Platelets: 254 10*3/uL (ref 150.0–400.0)
RBC: 5.37 Mil/uL — ABNORMAL HIGH (ref 3.87–5.11)
RDW: 14 % (ref 11.5–15.5)
WBC: 6.4 10*3/uL (ref 4.0–10.5)

## 2023-01-16 LAB — COMPREHENSIVE METABOLIC PANEL
ALT: 26 U/L (ref 0–35)
AST: 25 U/L (ref 0–37)
Albumin: 4.2 g/dL (ref 3.5–5.2)
Alkaline Phosphatase: 99 U/L (ref 39–117)
BUN: 12 mg/dL (ref 6–23)
CO2: 27 meq/L (ref 19–32)
Calcium: 9.6 mg/dL (ref 8.4–10.5)
Chloride: 105 meq/L (ref 96–112)
Creatinine, Ser: 0.83 mg/dL (ref 0.40–1.20)
GFR: 72.91 mL/min (ref >60.00)
Glucose, Bld: 134 mg/dL — ABNORMAL HIGH (ref 70–99)
Potassium: 3.9 meq/L (ref 3.5–5.1)
Sodium: 141 meq/L (ref 135–145)
Total Bilirubin: 0.3 mg/dL (ref 0.2–1.2)
Total Protein: 8.3 g/dL (ref 6.0–8.3)

## 2023-01-16 MED ORDER — POTASSIUM CHLORIDE CRYS ER 20 MEQ PO TBCR: 20.0000 meq | EXTENDED_RELEASE_TABLET | Freq: Every day | ORAL | 3 refills | Status: AC | PRN

## 2023-01-16 MED ORDER — MUPIROCIN 2 % EX OINT: 1.0000 | TOPICAL_OINTMENT | Freq: Two times a day (BID) | CUTANEOUS | 0 refills | Status: AC

## 2023-01-16 MED ORDER — TRIAMCINOLONE ACETONIDE 40 MG/ML IJ SUSP
40.0000 mg | Freq: Once | INTRAMUSCULAR | Status: AC
Start: 2023-01-16 — End: 2023-01-16
  Administered 2023-01-16: 40 mg via INTRAMUSCULAR

## 2023-01-16 MED ORDER — FUROSEMIDE 20 MG PO TABS: 20.0000 mg | ORAL_TABLET | Freq: Every day | ORAL | 3 refills | Status: AC | PRN

## 2023-01-16 NOTE — Progress Notes (Signed)
 Subjective  CC:  Chief Complaint  Patient presents with   Foot Swelling    Pt sttaed that she has some Bilateral Swelling feet and calf that started over this past weekend    HPI: Stacy Campbell is a 68 y.o. female who presents to the office today to address the problems listed above in the chief complaint. Discussed the use of AI scribe software for clinical note transcription with the patient, who gave verbal consent to proceed.  History of Present Illness   The patient, with a history of eczema, presents with a new rash and swelling in the lower extremities, particularly in the foot. She describes the rash as red and itchy, and has been applying triamcinolone  cream for relief. The patient also reports increased nocturnal leg swelling, which improves by morning. She has a history of heart and liver problems, which could be contributing to the swelling.  The patient has also been experiencing sciatic pain, localized to the hip area. This has been a long-standing issue, causing significant discomfort.  In addition to these physical symptoms, the patient has been struggling with weight gain and poor dietary habits, consuming more 'junk' food. She is currently on a mood stabilizer, Rexulti , which she believes may be contributing to her weight gain.       Assessment  1. Bilateral lower extremity edema   2. Morbid obesity   3. Dyshidrotic eczema   4. Hypertension associated with diabetes (HCC)   5. Other cirrhosis of liver (HCC)   6. Greater trochanteric bursitis of left hip      Plan  Assessment and Plan    Eczema with secondary infection Chronic eczema with recent exacerbation causing foot swelling and redness. No severe infection. Scratching has led to secondary infection. Dermatologist appointment on February 3rd. - Prescribe topical antibiotic for red areas - Continue triamcinolone  as previously prescribed - Advise to avoid scratching affected areas  Peripheral  edema Intermittent leg swelling, likely due to fluid retention. Liver and heart issues may contribute. Not taking home furosemide . Discussed importance of potassium with furosemide  to prevent hypokalemia. - Prescribe furosemide  as needed for swelling - Prescribe potassium supplement with furosemide  - Order blood tests for potassium, kidney function, and liver function  Left hip bursitis Chronic left hip pain likely due to bursitis. Agreed to steroid injection for relief. Discussed procedure and expected pain relief. - Administer steroid injection to left hip - Advise icing area 2-3 times daily for next few days  Weight gain and dietary habits Weight gain possibly due to increased appetite from Rexulti . Admits to eating junk food. Discussed Rexulti 's impact on weight and diabetes, emphasizing need to balance mood benefits with potential weight gain. - Discuss Rexulti 's impact on weight and diabetes - Encourage healthier eating habits  General Health Maintenance Routine follow-up and blood work to monitor ongoing health conditions. - Order blood tests for potassium, kidney function, and liver function - Schedule follow-up appointment in March.        Orders Placed This Encounter  Procedures   Comprehensive metabolic panel   CBC with Differential/Platelet   Meds ordered this encounter  Medications   furosemide  (LASIX ) 20 MG tablet    Sig: Take 1 tablet (20 mg total) by mouth daily as needed for edema. Take with potassium    Dispense:  90 tablet    Refill:  3   potassium chloride  SA (KLOR-CON  M) 20 MEQ tablet    Sig: Take 1 tablet (20 mEq total) by mouth  daily as needed (leg swelling). Take with furosemide     Dispense:  90 tablet    Refill:  3   mupirocin  ointment (BACTROBAN ) 2 %    Sig: Apply 1 Application topically 2 (two) times daily for 7 days.    Dispense:  22 g    Refill:  0   triamcinolone  acetonide (KENALOG -40) injection 40 mg     I reviewed the patients updated PMH,  FH, and SocHx.    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 01/04/2020    Priority: High   Monoclonal gammopathy of unknown significance (MGUS) 01/23/2019    Priority: High   Controlled type 2 diabetes mellitus without complication, without long-term current use of insulin  (HCC) 12/12/2018    Priority: High   Hyperlipidemia associated with type 2 diabetes mellitus (HCC) 12/12/2018    Priority: High   Asthma with COPD (HCC) 05/25/2013    Priority: High   Morbid obesity     Priority: High   Major depression, recurrent, chronic (HCC) 03/07/2006    Priority: High   Hypertension associated with diabetes (HCC) 03/07/2006    Priority: High   Chronic low back pain 03/07/2006    Priority: High   Insomnia 03/07/2006    Priority: High   Aortic ejection murmur 12/06/2021    Priority: Medium    DJD (degenerative joint disease) of cervical spine 09/06/2021    Priority: Medium    Chronic urticaria 06/12/2021    Priority: Medium    Degenerative joint disease (DJD) of lumbar spine 01/04/2020    Priority: Medium    History of hepatitis C 10/11/2017    Priority: Medium    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   carvedilol  (COREG ) 25 MG tablet TAKE 1 TABLET(25 MG) BY MOUTH TWICE DAILY WITH A MEAL   cetirizine  (ZYRTEC ) 10 MG tablet Take by mouth.   desvenlafaxine (PRISTIQ) 50 MG 24 hr tablet Take 50 mg by mouth daily.   furosemide  (LASIX ) 20 MG tablet Take 1 tablet (20 mg total) by mouth daily as needed for edema. Take with potassium   glucose blood test strip Test blood sugar daily and record on blood sugar log   Lancets (ACCU-CHEK SOFT TOUCH) lancets Check blood  sugar daily and record in log   metFORMIN  (GLUCOPHAGE -XR) 500 MG 24 hr tablet Take 1 tablet (500 mg total) by mouth daily with breakfast.   mupirocin  ointment (BACTROBAN ) 2 % Apply 1 Application topically 2 (two) times daily for 7 days.   oxyCODONE -acetaminophen  (PERCOCET ) 10-325 MG tablet Take 1 tablet by mouth 5 (five) times daily as needed.   potassium chloride  SA (KLOR-CON  M) 20 MEQ tablet Take 1 tablet (20 mEq total) by mouth daily as needed (leg swelling). Take with furosemide    REXULTI  2 MG TABS tablet Take 2 mg by mouth every morning.   rosuvastatin  (CRESTOR ) 10 MG tablet TAKE 1 TABLET(10 MG) BY MOUTH DAILY (Patient taking differently: Take 10 mg by mouth daily. TAKE 1 TABLET(10 MG) BY MOUTH DAILY)   tirzepatide  (MOUNJARO ) 7.5 MG/0.5ML Pen Inject 7.5 mg into the skin once a week.   triamcinolone  ointment (KENALOG ) 0.1 % Apply 1 application topically 2 (two) times daily.   Vitamin D , Ergocalciferol , (DRISDOL ) 1.25 MG (50000 UNIT) CAPS capsule Take 50,000 Units by mouth once a week.    Allergies: Patient is allergic to hydrocodone , linalool, methylisothiazolinone, propylene glycol, aspirin ,  egg-derived products, and hydrocodone -guaifenesin . Family History: Patient family history includes Alcoholism in her brother; Asthma in her brother, brother, brother, brother, father, and mother; Cancer in her father; Diabetes in her brother, brother, brother, brother, father, and mother; Eczema in her father; Hepatitis B in her daughter. Social History:  Patient  reports that she quit smoking about 14 years ago. Her smoking use included cigarettes. She started smoking about 51 years ago. She has a 9.3 pack-year smoking history. She has never used smokeless tobacco. She reports that she does not drink alcohol  and does not use drugs.  Review of Systems: Constitutional: Negative for fever malaise or anorexia Cardiovascular: negative for chest pain Respiratory: negative for SOB or persistent cough Gastrointestinal: negative for abdominal pain  Objective  Vitals: BP 136/60   Pulse 86   Temp 97.8 F (36.6 C)   Ht 5' 4 (1.626 m)   Wt 223 lb 3.2 oz (101.2 kg)   SpO2 98%   BMI 38.31 kg/m  General: no acute distress , A&Ox3 Physical Exam MUSCULOSKELETAL: Left hip tenderness upon palpation. SKIN: Erythema on legs, attributed to scratching.  GR Trochanteric Bursa steroid injection  Procedure Note   Pre-operative Diagnosis: left hip bursitis   Post-operative Diagnosis: same   Indications: pain   Anesthesia: cold spray   Procedure Details    Verbal consent was obtained for the procedure. Universal time out done. The point of maximum tenderness was identified and marked over the hip bursa. The skin prepped with alcohol  and cold spray used for anesthesia. A needle was advanced into the bursa and the steroid/lido (20mg  kenalog : 1.86ml lidocaine  w/o epi) was administered easily.    Complications:  None; patient tolerated the procedure well.   Commons side effects, risks, benefits, and alternatives for medications and treatment plan prescribed today were discussed, and the patient expressed  understanding of the given instructions. Patient is instructed to call or message via MyChart if he/she has any questions or concerns regarding our treatment plan. No barriers to understanding were identified. We discussed Red Flag symptoms and signs in detail. Patient expressed understanding regarding what to do in case of urgent or emergency type symptoms.  Medication list was reconciled, printed and provided to the patient in AVS. Patient instructions and summary information was reviewed with the patient as documented in the AVS. This note was  prepared with assistance of Conservation officer, historic buildings. Occasional wrong-word or sound-a-like substitutions may have occurred due to the inherent limitations of voice recognition software

## 2023-01-16 NOTE — Patient Instructions (Addendum)
 Please follow up as scheduled for your next visit with me: 03/20/2023   If you have any questions or concerns, please don't hesitate to send me a message via MyChart or call the office at [phone]. Thank you for visiting with us  today! It's our pleasure caring for you.    VISIT SUMMARY:  During today's visit, we addressed several health concerns including your eczema, leg swelling, hip pain, sciatic pain, and weight gain. We discussed treatment plans and lifestyle changes to help manage these issues.  YOUR PLAN:  -ECZEMA WITH SECONDARY INFECTION: Eczema is a condition that makes your skin red and itchy. Your recent flare-up has caused swelling and redness in your foot, and scratching has led to a secondary infection. We have prescribed a topical antibiotic for the red areas and advised you to continue using triamcinolone  cream. Please avoid scratching the affected areas.  -PERIPHERAL EDEMA: Peripheral edema is swelling caused by fluid retention, often in the legs. Your liver and heart issues may be contributing to this. We have prescribed furosemide  to help reduce the swelling and a potassium supplement to prevent low potassium levels. Blood tests have been ordered to check your potassium, kidney, and liver function.  -LEFT HIP BURSITIS: Bursitis is inflammation of the fluid-filled sacs (bursae) that cushion the bones, tendons, and muscles near your joints. This is likely causing your chronic left hip pain. We have administered a steroid injection to help relieve the pain and advised you to ice the area 2-3 times daily for the next few days.   -WEIGHT GAIN AND DIETARY HABITS: Your weight gain may be due to increased appetite from your medication, Rexulti , and poor dietary habits. We discussed the impact of Rexulti  on weight and the importance of balancing its mood benefits with potential weight gain. We encourage you to adopt healthier eating habits.  -GENERAL HEALTH MAINTENANCE: We have  ordered routine blood tests to monitor your potassium, kidney, and liver function. Please continue with your regular follow-up appointments to keep track of your ongoing health conditions.  INSTRUCTIONS:  Please follow up with the dermatologist on February 3rd for your eczema. Additionally, schedule a follow-up appointment with us  in March to review your blood test results and overall health progress.

## 2023-01-17 NOTE — Progress Notes (Signed)
 See mychart note Dear Ms. Lemonds, Your lab results are all stable. Please take the lasix  with potassium as needed like we discussed. This should help your swelling. Your kidney function remains normal; this is good news.  Work on [employer] and I hope the shot will help your hip pain.  See you next time.  Sincerely, Dr. Jodie

## 2023-01-18 ENCOUNTER — Other Ambulatory Visit: Payer: Self-pay | Admitting: Cardiology

## 2023-01-24 ENCOUNTER — Ambulatory Visit: Payer: Medicare HMO | Admitting: Adult Health

## 2023-02-11 ENCOUNTER — Ambulatory Visit: Payer: Medicare HMO | Admitting: Podiatry

## 2023-03-04 ENCOUNTER — Ambulatory Visit (INDEPENDENT_AMBULATORY_CARE_PROVIDER_SITE_OTHER): Payer: Medicare HMO | Admitting: Podiatry

## 2023-03-04 ENCOUNTER — Encounter: Payer: Self-pay | Admitting: Podiatry

## 2023-03-04 DIAGNOSIS — M79674 Pain in right toe(s): Secondary | ICD-10-CM

## 2023-03-04 DIAGNOSIS — M79675 Pain in left toe(s): Secondary | ICD-10-CM

## 2023-03-04 DIAGNOSIS — B351 Tinea unguium: Secondary | ICD-10-CM

## 2023-03-04 NOTE — Progress Notes (Signed)
 Subjective:  Patient ID: Stacy Campbell, female    DOB: [birth year],  MRN: [id number]  Stacy Campbell presents to clinic today for:  Chief Complaint  Patient presents with   [facility]    She is here for diabetic nail trim,  and last A1C was 5.3.    Patient notes nails are thick, discolored, elongated and painful in shoegear when trying to ambulate.  States her A1c is getting under better control.  States that it is around 5.3 now.    PCP is Willow Ora, MD.  Past Medical History:  Diagnosis Date   Angio-edema    Anxiety    Arthritis    "lower back" (01/29/2017)   Asthma    Bone spur    spine   Chronic lower back pain    Cirrhosis of liver (HCC) 12/2018   Depression    Diabetes mellitus without complication (HCC)    Eczema    GERD (gastroesophageal reflux disease)    Hepatitis C    was treated for this   Hyperplastic polyp of intestine    Hypertension    Insomnia    OSA (obstructive sleep apnea)    Pending formal sleep study   Osteoarthritis    Osteoporosis    Paroxysmal atrial fibrillation (HCC) 09/27/2021   cardioversion in 1990s; Noted to be in Afib-RVR when being evaluated by sleep medicine.   Splenic artery aneurysm (HCC)    Urticaria     Past Surgical History:  Procedure Laterality Date   CARDIOVERSION  1990s   CARDIOVERSION N/A 10/18/2021   Procedure: CARDIOVERSION;  Surgeon: Jake Bathe, MD;  Location: [facility];  Service: Cardiovascular;  Laterality: N/A;   CHOLECYSTECTOMY N/A 01/22/2018   Procedure: LAPAROSCOPIC CHOLECYSTECTOMY ERAS PATHWAY;  Surgeon: Berna Bue, MD;  Location: [facility];  Service: General;  Laterality: N/A;   CYSTOSCOPY W/ URETERAL STENT PLACEMENT Left 01/29/2017   Procedure: CYSTOSCOPY WITH RETROGRADE PYELOGRAM/URETERAL STENT PLACEMENT;  Surgeon: Crist Fat, MD;  Location: [facility] OR;  Service: Urology;  Laterality: Left;   CYSTOSCOPY WITH RETROGRADE PYELOGRAM, URETEROSCOPY AND STENT PLACEMENT Left  01/29/2017   CYSTOSCOPY WITH RETROGRADE PYELOGRAM/URETERAL STENT PLACEMENT   NM MYOVIEW LTD  10/28/2017   Nuclear stress EF: 63%. The left ventricular ejection fraction is normal (55-65%). There was no ST segment deviation noted during stress. The study is normal.  This is a low risk study.   NM MYOVIEW LTD  11/06/2021   ?  Myocardial infarction located in the anterior region? ->  Despite this, there is a EF of 71% with no RWMA.  READ AS LOW RISK/LOW RISK; in fact, on review of images the anterior defect appeared to be consistent with breast attenuation.  No evidence of ischemia or infarction.  Preserved EF 71%.   TRANSTHORACIC ECHOCARDIOGRAM  01/30/2017   Normal LV size and function-hyperdynamic/vigorous.  EF 65 to 70%. No RWMA.  Focal basal hypertrophy - Minimal LVOT gradient (2.6 m/s).  GRII DD.  Calcified AoV with no stenosis.  Mild MAC.  Trivial TR.  Peak PAP 55 mmHg.    Allergies  Allergen Reactions   Hydrocodone Itching   Linalool    Methylisothiazolinone Other (See Comments)    Other reaction(s): Other (See Comments) Positive patch test Positive patch test    Propylene Glycol    Aspirin Itching and Other (See Comments)    Lower dose doesn't make her itch (she takes it every day)   Egg-Derived Products Nausea  Only and Other (See Comments)    No reaction if in another food   Hydrocodone-Guaifenesin Rash    Review of Systems: Negative except as noted in the HPI.  Objective:  Stacy Campbell is a pleasant 68 y.o. female in NAD. AAO x 3.  Vascular Examination: Capillary refill time is 3-5 seconds to toes bilateral. Palpable pedal pulses b/l LE. Digital hair present b/l.  Skin temperature gradient WNL b/l. No varicosities b/l. No cyanosis noted b/l. Minimal generalized edema bilateral legs/ankles.  Dermatological Examination: Pedal skin with normal turgor, texture and tone b/l. No open wounds. No interdigital macerations b/l. Toenails x10 are 3mm thick, discolored,  dystrophic with subungual debris. There is pain with compression of the nail plates.  They are elongated x10     Latest Ref Rng & Units 12/20/2022    9:58 AM 10/01/2022    3:42 PM 06/20/2022   10:53 AM  Hemoglobin A1C  Hemoglobin-A1c 4.0 - 5.6 % 5.3  6.3  6.6    Assessment/Plan: 1. Pain due to onychomycosis of toenails of both feet    The mycotic toenails were sharply debrided x10 with sterile nail nippers and a power debriding burr to decrease bulk/thickness and length.    Return in about 3 months (around 06/01/2023) for [facility]   Clerance Lav, DPM, FACFAS [facility]     [address] [place], [place] [address]                Office [phone]  Fax [phone]

## 2023-03-11 ENCOUNTER — Ambulatory Visit: Payer: Self-pay | Admitting: Family Medicine

## 2023-03-11 MED ORDER — BENZONATATE 200 MG PO CAPS: 200.0000 mg | ORAL_CAPSULE | Freq: Two times a day (BID) | ORAL | 0 refills | Status: DC | PRN

## 2023-03-11 NOTE — Telephone Encounter (Signed)
 Chief Complaint: Dry cough Symptoms: Dry cough Frequency: >1 week Pertinent Negatives: Patient denies fever, chest pain Disposition: [] ED /[] Urgent Care (no appt availability in office) / [] Appointment(In office/virtual)/ []  [facility]/ [x] Home Care/ [] Refused Recommended Disposition /[] [facility]/ []  Follow-up with PCP Additional Notes: Patient called in stating her cold symptoms have improved but she cannot improve the cough. Patient states she is experiencing a dry cough and feels like she needs to cough up phlegm but is unable to. Patient has been utilizing inhaler and states it helps a little bit, but patient would like to know if PCP would call her in tess[place]on perles for her cough to [facility] pharmacy   Reason for Disposition  Cough  Answer Assessment - Initial Assessment Questions 1. ONSET: "When did the cough begin?"      Last week  2. SEVERITY: "How bad is the cough today?"      Around the clock 3. SPUTUM: "Describe the color of your sputum" (none, dry cough; clear, white, yellow, green)     No 4. HEMOPTYSIS: "Are you coughing up any blood?" If so ask: "How much?" (flecks, streaks, tablespoons, etc.)     No 5. DIFFICULTY BREATHING: "Are you having difficulty breathing?" If Yes, ask: "How bad is it?" (e.g., mild, moderate, severe)    - MILD: No SOB at rest, mild SOB with walking, speaks normally in sentences, can lie down, no retractions, pulse < 100.    - MODERATE: SOB at rest, SOB with minimal exertion and prefers to sit, cannot lie down flat, speaks in phrases, mild retractions, audible wheezing, pulse 100-120.    - SEVERE: Very SOB at rest, speaks in single words, struggling to breathe, sitting hunched forward, retractions, pulse > 120      No 6. FEVER: "Do you have a fever?" If Yes, ask: "What is your temperature, how was it measured, and when did it start?"     No 7. CARDIAC HISTORY: "Do you have any history of heart disease?" (e.g., heart attack,  congestive heart failure)      A-Fib 8. LUNG HISTORY: "Do you have any history of lung disease?"  (e.g., pulmonary embolus, asthma, emphysema)     No 9. PE RISK FACTORS: "Do you have a history of blood clots?" (or: recent major surgery, recent prolonged travel, bedridden)     No 10. OTHER SYMPTOMS: "Do you have any other symptoms?" (e.g., runny nose, wheezing, chest pain)       Wheezing when cough  Protocols used: Cough - Acute Non-Productive-A-AH

## 2023-03-11 NOTE — Addendum Note (Signed)
 Addended by: Asencion Partridge on: 03/11/2023 03:02 PM   Modules accepted: Orders

## 2023-03-20 ENCOUNTER — Ambulatory Visit: Payer: Medicare HMO | Admitting: Family Medicine

## 2023-03-22 ENCOUNTER — Encounter: Payer: Self-pay | Admitting: Family Medicine

## 2023-03-22 ENCOUNTER — Ambulatory Visit (INDEPENDENT_AMBULATORY_CARE_PROVIDER_SITE_OTHER): Admitting: Family Medicine

## 2023-03-22 VITALS — BP 124/72 | HR 104 | Temp 97.9°F | Ht 64.0 in | Wt 219.6 lb

## 2023-03-22 DIAGNOSIS — E1159 Type 2 diabetes mellitus with other circulatory complications: Secondary | ICD-10-CM | POA: Diagnosis not present

## 2023-03-22 DIAGNOSIS — R600 Localized edema: Secondary | ICD-10-CM

## 2023-03-22 DIAGNOSIS — I152 Hypertension secondary to endocrine disorders: Secondary | ICD-10-CM | POA: Diagnosis not present

## 2023-03-22 DIAGNOSIS — Z7985 Long-term (current) use of injectable non-insulin antidiabetic drugs: Secondary | ICD-10-CM

## 2023-03-22 DIAGNOSIS — L409 Psoriasis, unspecified: Secondary | ICD-10-CM | POA: Diagnosis not present

## 2023-03-22 DIAGNOSIS — L301 Dyshidrosis [pompholyx]: Secondary | ICD-10-CM

## 2023-03-22 DIAGNOSIS — F339 Major depressive disorder, recurrent, unspecified: Secondary | ICD-10-CM

## 2023-03-22 DIAGNOSIS — E119 Type 2 diabetes mellitus without complications: Secondary | ICD-10-CM

## 2023-03-22 LAB — POCT GLYCOSYLATED HEMOGLOBIN (HGB A1C): Hemoglobin A1C: 5.8 % — AB (ref 4.0–5.6)

## 2023-03-22 NOTE — Patient Instructions (Addendum)
Please return in 3 months for your annual complete physical; please come fasting.   If you have any questions or concerns, please don't hesitate to send me a message via MyChart or call the office at [phone]. Thank you for visiting with us today! It's our pleasure caring for you.  

## 2023-03-22 NOTE — Progress Notes (Signed)
 Subjective  CC:  Chief Complaint  Patient presents with   Diabetes    HPI: Stacy Campbell is a 68 y.o. female who presents to the office today for follow up of diabetes and problems listed above in the chief complaint.  Diabetes follow up: Her diabetic control is reported as Unchanged. Doing well with mounjaro. No adverse effects. Ran out of metformin a month ago and had trouble with refill request.  She denies exertional CP or SOB or symptomatic hypoglycemia. She denies foot sores or paresthesias.  Leg edema and eczema flare: now resolved. Using lasix prn weekly. Weight is down.  Mood is stable again.  HTN: home readings are normal. 120s/70s. Elevated in office today: rushing for appt perhaps the cause. No cp or palpitations.  Wt Readings from Last 3 Encounters:  03/22/23 219 lb 9.6 oz (99.6 kg)  01/16/23 223 lb 3.2 oz (101.2 kg)  12/20/22 218 lb (98.9 kg)    BP Readings from Last 3 Encounters:  03/22/23 124/72  01/16/23 136/60  12/20/22 136/70    Assessment  1. Controlled type 2 diabetes mellitus without complication, without long-term current use of insulin (HCC)   2. Hypertension associated with diabetes (HCC)   3. Psoriasis   4. Dyshidrotic eczema   5. Bilateral lower extremity edema   6. Major depression, recurrent, chronic (HCC)      Plan  Diabetes is currently very well controlled. Stop metformin. Continue mounjaro 7.5mg  weekly.  Htn is controlled by home readings. Rare elevated reading in office today. Monitor. No med changes Eczema and leg edema are now improved. Skin looks good. Prn lasix and steroid creams.  Depression is stable.   Follow up: 3 mo for cpe Orders Placed This Encounter  Procedures   POCT HgB A1C   No orders of the defined types were placed in this encounter.     Immunization History  Administered Date(s) Administered   Fluad Quad(high Dose 65+) 11/18/2020, 10/06/2021   Fluad Trivalent(High Dose 65+) 10/01/2022   Hepatitis B,  ADULT 10/12/2014, 11/09/2014, 04/11/2015   Influenza Split 11/14/2010, 10/09/2011   Influenza Whole 10/13/2007, 01/10/2009, 11/22/2009   Influenza,inj,Quad PF,6+ Mos 10/10/2012, 10/08/2013, 10/15/2014, 09/29/2015, 11/06/2016, 09/10/2017, 10/15/2018, 09/30/2019   PFIZER(Purple Top)SARS-COV-2 Vaccination 03/20/2019, 04/15/2019, 10/15/2019   PNEUMOCOCCAL CONJUGATE-20 11/18/2020   Pneumococcal Polysaccharide-23 01/10/2009   Td 01/09/1999, 01/10/2009    Diabetes Related Lab Review: Lab Results  Component Value Date   HGBA1C 5.8 (A) 03/22/2023   HGBA1C 5.3 12/20/2022   HGBA1C 6.3 10/01/2022    Lab Results  Component Value Date   MICROALBUR <0.7 06/20/2022   Lab Results  Component Value Date   CREATININE 0.83 01/16/2023   BUN 12 01/16/2023   NA 141 01/16/2023   K 3.9 01/16/2023   CL 105 01/16/2023   CO2 27 01/16/2023   Lab Results  Component Value Date   CHOL 108 06/20/2022   CHOL 102 02/24/2021   CHOL 140 02/15/2020   Lab Results  Component Value Date   HDL 44.30 06/20/2022   HDL 36.30 (L) 02/24/2021   HDL 37.50 (L) 02/15/2020   Lab Results  Component Value Date   LDLCALC 42 06/20/2022   LDLCALC 27 02/24/2021   LDLCALC 73 02/15/2020   Lab Results  Component Value Date   TRIG 111.0 06/20/2022   TRIG 194.0 (H) 02/24/2021   TRIG 148.0 02/15/2020   Lab Results  Component Value Date   CHOLHDL 2 06/20/2022   CHOLHDL 3 02/24/2021   CHOLHDL 4  02/15/2020   No results found for: "LDLDIRECT" The ASCVD Risk score (Arnett DK, et al., 2019) failed to calculate for the following reasons:   The valid total cholesterol range is 130 to 320 mg/dL I have reviewed the PMH, Fam and Soc history. Patient Active Problem List   Diagnosis Date Noted Date Diagnosed   Mixed simple and mucopurulent chronic bronchitis (HCC) 06/20/2022     Priority: High   Chronic narcotic dependence (HCC) 06/20/2022     Priority: High   Persistent atrial fibrillation (HCC) 10/04/2021     Priority:  High   Secondary hypercoagulable state (HCC) 10/04/2021     Priority: High   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 01/04/2020     Priority: High   Monoclonal gammopathy of unknown significance (MGUS) 01/23/2019     Priority: High   Controlled type 2 diabetes mellitus without complication, without long-term current use of insulin (HCC) 12/12/2018     Priority: High    Diagnosed 2020 Metformin 500 xr daily Mounjaro  Stopped metformin  03/2023 due to excellent diabetic control with mounjaro    Hyperlipidemia associated with type 2 diabetes mellitus (HCC) 12/12/2018     Priority: High    LDL goal of 70 given diabetes    Asthma with COPD (HCC) 05/25/2013     Priority: High   Morbid obesity      Priority: High   Major depression, recurrent, chronic (HCC) 03/07/2006     Priority: High    Sees psychiatry: Horatio Pel, FNP; [facility] with Spero Geralds, PsyD    Hypertension associated with diabetes (HCC) 03/07/2006     Priority: High   Chronic low back pain 03/07/2006     Priority: High   Insomnia 03/07/2006     Priority: High    On chronic Ambien (started by previous provider).     Aortic ejection murmur 12/06/2021     Priority: Medium    DJD (degenerative joint disease) of cervical spine 09/06/2021     Priority: Medium    Chronic urticaria 06/12/2021     Priority: Medium     Has had chronic pred use for years/eczema    Degenerative joint disease (DJD) of lumbar spine 01/04/2020     Priority: Medium    History of hepatitis C 10/11/2017     Priority: Medium     S/P Treatment with sofosbuvir and simeprivir.  Followed by hep clinic.  Undetectable viral load - Cured.    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 Her smoking use included cigarettes. She started smoking about 52 years ago. She has a 9.3 pack-year smoking history. She has never used smokeless tobacco. She reports that she does not drink alcohol and does not use drugs.  Review of Systems: Ophthalmic: negative for eye pain, loss of vision or double vision Cardiovascular: negative for chest pain Respiratory: negative for SOB or persistent cough Gastrointestinal: negative  for abdominal pain Genitourinary: negative for dysuria or gross hematuria MSK: negative for foot lesions Neurologic: negative for weakness or gait disturbance  Objective  Vitals: BP 124/72 Comment: by regular home readings  Pulse (!) 104   Temp 97.9 F (36.6 C)   Ht 5\' 4"  (1.626 m)   Wt 219 lb 9.6 oz (99.6 kg)   SpO2 96%   BMI 37.69 kg/m  General: well appearing, no acute distress  Psych:  Alert and oriented, normal mood and affect HEENT:  Normocephalic, atraumatic, moist mucous membranes, supple neck  Cardiovascular:  Nl S1 and S2, RRR without murmur, gallop or rub. no edema Respiratory:  Good breath sounds bilaterally, CTAB with normal effort, no rales Skin:  Warm, no rashes Foot exam: no erythema, pallor, or cyanosis visible nl proprioception and sensation to monofilament testing bilaterally, +2 distal pulses bilaterally    Diabetic education: ongoing education regarding chronic disease management for diabetes was given today. We continue to reinforce the ABC's of diabetic management: A1c (<7 or 8 dependent upon patient), tight blood pressure control, and cholesterol management with goal LDL < 100 minimally. We discuss diet strategies, exercise recommendations, medication options and possible side effects. At each visit, we  review recommended immunizations and preventive care recommendations for diabetics and stress that good diabetic control can prevent other problems. See below for this patient's data.   Commons side effects, risks, benefits, and alternatives for medications and treatment plan prescribed today were discussed, and the patient expressed understanding of the given instructions. Patient is instructed to call or message via MyChart if he/she has any questions or concerns regarding our treatment plan. No barriers to understanding were identified. We discussed Red Flag symptoms and signs in detail. Patient expressed understanding regarding what to do in case of urgent or emergency type symptoms.  Medication list was reconciled, printed and provided to the patient in AVS. Patient instructions and summary information was reviewed with the patient as documented in the AVS. This note was prepared with assistance of Dragon voice recognition software. Occasional wrong-word or sound-a-like substitutions may have occurred due to the inherent limitations of voice recognition software

## 2023-04-05 ENCOUNTER — Other Ambulatory Visit: Payer: Self-pay | Admitting: Family Medicine

## 2023-04-05 DIAGNOSIS — J4489 Other specified chronic obstructive pulmonary disease: Secondary | ICD-10-CM

## 2023-04-05 MED ORDER — ALBUTEROL SULFATE HFA 108 (90 BASE) MCG/ACT IN AERS: 2.0000 | INHALATION_SPRAY | Freq: Four times a day (QID) | RESPIRATORY_TRACT | 1 refills | Status: DC | PRN

## 2023-04-05 NOTE — Telephone Encounter (Signed)
 Copied from CRM [id number]. Topic: Clinical - Medication Refill >> Apr 05, 2023 10:56 AM Marica Otter wrote: Most Recent Primary Care Visit:  Provider: Willow Ora  Department: [facility]  Visit Type: OFFICE VISIT  Date: 03/22/2023  Medication: albuterol (VENTOLIN HFA) 108 (90 Base) MCG/ACT inhaler  Has the patient contacted their pharmacy? Yes, no refills contact provider (Agent: If no, request that the patient contact the pharmacy for the refill. If patient does not wish to contact the pharmacy document the reason why and proceed with request.) (Agent: If yes, when and what did the pharmacy advise?)  Is this the correct pharmacy for this prescription? Yes If no, delete pharmacy and type the correct one.  This is the patient's preferred pharmacy:  [facility] [phone] - [place], [place] - [address] AT [facility] [address] [place] [address] Phone: [phone] Fax: [phone]   Has the prescription been filled recently? No  Is the patient out of the medication? No  Has the patient been seen for an appointment in the last year OR does the patient have an upcoming appointment? Yes  Can we respond through MyChart? No  Agent: Please be advised that Rx refills may take up to 3 business days. We ask that you follow-up with your pharmacy.

## 2023-05-13 ENCOUNTER — Encounter (HOSPITAL_BASED_OUTPATIENT_CLINIC_OR_DEPARTMENT_OTHER): Payer: Self-pay

## 2023-05-14 ENCOUNTER — Telehealth: Payer: Self-pay | Admitting: Pharmacy Technician

## 2023-05-14 ENCOUNTER — Ambulatory Visit: Payer: Medicare HMO | Attending: Cardiology | Admitting: Cardiology

## 2023-05-14 ENCOUNTER — Encounter: Payer: Self-pay | Admitting: Cardiology

## 2023-05-14 ENCOUNTER — Telehealth: Payer: Self-pay | Admitting: Cardiology

## 2023-05-14 ENCOUNTER — Other Ambulatory Visit (HOSPITAL_COMMUNITY): Payer: Self-pay

## 2023-05-14 VITALS — BP 142/92 | HR 103 | Ht 64.0 in | Wt 226.0 lb

## 2023-05-14 DIAGNOSIS — E1169 Type 2 diabetes mellitus with other specified complication: Secondary | ICD-10-CM | POA: Diagnosis not present

## 2023-05-14 DIAGNOSIS — E1159 Type 2 diabetes mellitus with other circulatory complications: Secondary | ICD-10-CM

## 2023-05-14 DIAGNOSIS — D6869 Other thrombophilia: Secondary | ICD-10-CM | POA: Diagnosis not present

## 2023-05-14 DIAGNOSIS — I152 Hypertension secondary to endocrine disorders: Secondary | ICD-10-CM

## 2023-05-14 DIAGNOSIS — I4819 Other persistent atrial fibrillation: Secondary | ICD-10-CM | POA: Diagnosis not present

## 2023-05-14 DIAGNOSIS — Z09 Encounter for follow-up examination after completed treatment for conditions other than malignant neoplasm: Secondary | ICD-10-CM

## 2023-05-14 DIAGNOSIS — E785 Hyperlipidemia, unspecified: Secondary | ICD-10-CM

## 2023-05-14 DIAGNOSIS — Z6841 Body Mass Index (BMI) 40.0 and over, adult: Secondary | ICD-10-CM

## 2023-05-14 MED ORDER — DILTIAZEM HCL ER COATED BEADS 360 MG PO CP24: 360.0000 mg | ORAL_CAPSULE | Freq: Every day | ORAL | 3 refills | Status: DC

## 2023-05-14 MED ORDER — DILTIAZEM HCL ER 360 MG PO TB24: 360.0000 mg | ORAL_TABLET | Freq: Every day | ORAL | 3 refills | Status: DC

## 2023-05-14 NOTE — Patient Instructions (Addendum)
 Medication Instructions:   No changes   Refilled Diltiazem   24 hr ( 360 mg )  - capsule  *If you need a refill on your cardiac medications before your next appointment, please call your pharmacy*   Lab Work: Not needed   Testing/Procedures:  Not  needed  Follow-Up: At [facility], you and your health needs are our priority.  As part of our continuing mission to provide you with exceptional heart care, we have created designated Provider Care Teams.  These Care Teams include your primary Cardiologist (physician) and Advanced Practice Providers (APPs -  Physician Assistants and Nurse Practitioners) who all work together to provide you with the care you need, when you need it.     Your next appointment:   12 month(s)  The format for your next appointment:   In Person  Provider:   Randene Bustard, MD

## 2023-05-14 NOTE — Progress Notes (Signed)
 Cardiology Office Note:  .   Date:  05/22/2023  ID:  Stacy VANDALE, DOB June 22, 1955, MRN [id number] PCP: Luevenia Saha, MD  [facility] Providers Cardiologist:  Randene Bustard, MD     Chief Complaint  Patient presents with   Follow-up    Doing well.   Atrial Fibrillation    No breakthrough episodes    Patient Profile: .     Stacy Campbell is a morbidly obese 68 y.o. female with a PMH below,who presents here for annual follow-up.  She returns at the request of Luevenia Saha, MD.  PMH notable for OSA, DM-2, HTN, history of hep C, with Paroxysmal Persistent A-Fib (requiring cardioversion-originally thought that it was ablation, but was actually DCCV, CHA2DS2-VASc score 4)     Stacy Campbell was last seen on 05/08/2022 for a 30-month follow-up doing pretty well.  She was doing her best to try to lose weight with increasing exercise and adjusting her diet.  With doing walking and water aerobics.  Considering nutrition counseling.  No suggestion of breakthrough spells of A-fib.  No other cardiac symptoms.  Consider switching to a different GLP-1 agonist to be more affectionate with weight loss.  Also limited by joint pains with less exercise intolerance.  => No medication changes made   Subjective  Discussed the use of AI scribe software for clinical note transcription with the patient, who gave verbal consent to proceed.  History of Present Illness History of Present Illness Stacy Campbell is a 68 year old female with atrial fibrillation who presents for a follow-up visit.  She has not experienced any recent episodes of atrial fibrillation, heart racing, or palpitations. No dizziness, wooziness, or sensations of syncope.  No TIA or amaurosis fugax.  She also reports no dyspnea, orthopnea, or paroxysmal nocturnal dyspnea, and no peripheral edema.  She occasionally experiences chest pain, but it is not exertional. No chest tightness or pressure. No  hematochezia, melena, hematuria, epistaxis, or claudication.  Regarding her vision, she has had cataracts, which she believes have been addressed, and denies any sudden onset of blurred vision or headaches.  Her current medications include Eliquis  5 mg twice daily, carvedilol  25 mg twice daily, Lasix  20 mg as needed (approximately once a week), rosuvastatin  10 mg daily, Mounjaro  75 mg, and Ambien  10 mg nightly, , Pristiq 50 mg, doxepin  25 mg, ergocalciferol , CellCept  for dermatitis. She is not currently taking diltiazem , which she ran out of about a month ago, and is not taking any diabetic medications like Jardiance  or Farxiga, as she was switched to Mounjaro .    Objective   Studies Reviewed: Aaron Aas   EKG Interpretation Date/Time:  Tuesday May 14 2023 11:20:26 EDT Ventricular Rate:  97 PR Interval:  154 QRS Duration:  76 QT Interval:  356 QTC Calculation: 452 R Axis:   -12  Text Interpretation: Normal sinus rhythm Low voltage QRS Cannot rule out Anterior infarct , age undetermined When compared with ECG of 12-Sep-2022 15:31, Rate faster Otherwise no significant change Confirmed by Randene Bustard ([id number]) on 05/14/2023 11:39:55 AM    No recent studies  Lab Results  Component Value Date   CHOL 108 06/20/2022   HDL 44.30 06/20/2022   LDLCALC 42 06/20/2022   TRIG 111.0 06/20/2022   CHOLHDL 2 06/20/2022   Lab Results  Component Value Date   NA 141 01/16/2023   K 3.9 01/16/2023   CREATININE 0.83 01/16/2023   GFR 72.91 01/16/2023   GLUCOSE 134 (H)  01/16/2023   Lab Results  Component Value Date   WBC 6.4 01/16/2023   HGB 13.4 01/16/2023   HCT 41.9 01/16/2023   MCV 78.1 01/16/2023   PLT 254.0 01/16/2023    Lab Results  Component Value Date   HGBA1C 5.8 (A) 03/22/2023    Risk Assessment/Calculations:    CHA2DS2-VASc Score = 5   This indicates a 7.2% annual risk of stroke. The patient's score is based upon: CHF History: 0 HTN History: 1 Diabetes History: 1 Stroke History:  0 Vascular Disease History: 1 Age Score: 1 Gender Score: 1   On DOAC  Blood pressure elevated.  Plan is to refill diltiazem  CR 6 mg.      Physical Exam:   VS:  BP (!) 142/92   Pulse (!) 103   Ht 5\' 4"  (1.626 m)   Wt 226 lb (102.5 kg)   SpO2 99%   BMI 38.79 kg/m    Wt Readings from Last 3 Encounters:  05/14/23 226 lb (102.5 kg)  03/22/23 219 lb 9.6 oz (99.6 kg)  01/16/23 223 lb 3.2 oz (101.2 kg)  04/2022: -236 pounds, BMI 40.5  GEN: Well nourished, well groomed in no acute distress; moderately obese. NECK: No JVD; No carotid bruits CARDIAC: RRR, distant S1, S2; 2/6 SEM at RUSB. No rubs, gallops RESPIRATORY:  Clear to auscultation without rales, wheezing or rhonchi ; nonlabored, good air movement. ABDOMEN: Soft, non-tender, non-distended EXTREMITIES: Trivial edema; No deformity; diminished pedal pulses due to body habitus.    ASSESSMENT AND PLAN: .    Problem List Items Addressed This Visit       Cardiology Problems   Hyperlipidemia associated with type 2 diabetes mellitus (HCC) (Chronic)   Cholesterol levels normal as of June last year. Due for re-evaluation in July. - Schedule follow-up with Dr. Jonelle Neri in July for cholesterol and blood work evaluation. - Continue current dose of rosuvastatin  10 mg daily  A1c at 5.8% as of March 22, 2023, indicating good control. Mounjaro  aids in weight loss. - Continue Mounjaro  for diabetes management and weight loss.      Relevant Medications   EPINEPHrine  0.3 mg/0.3 mL IJ SOAJ injection   prazosin (MINIPRESS) 2 MG capsule   Hypertension associated with diabetes (HCC) (Chronic)   Mildly elevated BP today. Likely related to discontinuation of diltiazem  - Refill diltiazem  360 mg prescription - Continue carvedilol  25 mg twice daily      Relevant Medications   EPINEPHrine  0.3 mg/0.3 mL IJ SOAJ injection   prazosin (MINIPRESS) 2 MG capsule   Persistent atrial fibrillation (HCC) (Chronic)   No recent episodes. Elevated heart rate  at 103 bpm.  .EKG shows no atrial fibrillation.  Inadvertent diltiazem  discontinuation may contribute to elevated heart rate. - Refill diltiazem  XT 360 mg prescription. - Continue carvedilol  25 mg twice daily - Continue Eliquis  5 mg twice daily - Monitor heart rate and symptoms.      Relevant Medications   EPINEPHrine  0.3 mg/0.3 mL IJ SOAJ injection   prazosin (MINIPRESS) 2 MG capsule     Other   Morbid obesity (Chronic)   Working on weight loss with 10 pound weight loss since last visit. She is working on her diet and exercise and is now on Mounjaro  which will help to continue to assist with weight loss.      Secondary hypercoagulable state (HCC) (Chronic)   CHA2DS2-VASc or 5.  On Eliquis  for stroke prophylaxis.  - Okay to hold Eliquis  2 to 3 days preop for  surgical procedures per protocol.      Other Visit Diagnoses       Follow-up exam    -  Primary   Relevant Orders   EKG 12-Lead (Completed)           Follow-Up: Return in about 1 year (around 05/13/2024) for Routine follow up with me or APP; , [employer].     Signed, Arleen Lacer, MD, MS Randene Bustard, M.D., M.S. Interventional Chartered certified accountant  Pager # [phone]

## 2023-05-14 NOTE — Telephone Encounter (Signed)
   But when I ran it for capsule it went through    Sent the staff a message

## 2023-05-14 NOTE — Telephone Encounter (Signed)
 Noted  Rx sent for Diltiazem  360 MG Capsule

## 2023-05-14 NOTE — Telephone Encounter (Signed)
   But when I ran it for capsule it went through    Can this be changed to the capsule like she has been getting? Thank you!

## 2023-05-14 NOTE — Telephone Encounter (Signed)
 Pt c/o medication issue:  1. Name of Medication:   diltiazem  (CARDIZEM  LA) 360 MG 24 hr tablet    2. How are you currently taking this medication (dosage and times per day)? As written   3. Are you having a reaction (difficulty breathing--STAT)? No   4. What is your medication issue? Pt called in stating her pharmacy did not fill this medication. I called pharmacy for her, they stated her insurance won't cover it. Please advise.

## 2023-05-14 NOTE — Telephone Encounter (Signed)
 Patient identification verified by 2 forms. Hilton Lucky, RN    Called and spoke to patient  Informed patient new Rx sent for diltiazem  Capsules  Patient verbalized understanding, no questions at this time

## 2023-05-22 ENCOUNTER — Encounter: Payer: Self-pay | Admitting: Cardiology

## 2023-05-22 LAB — HM DIABETES EYE EXAM

## 2023-05-22 NOTE — Assessment & Plan Note (Signed)
 No recent episodes. Elevated heart rate at 103 bpm.  .EKG shows no atrial fibrillation.  Inadvertent diltiazem  discontinuation may contribute to elevated heart rate. - Refill diltiazem  XT 360 mg prescription. - Continue carvedilol  25 mg twice daily - Continue Eliquis  5 mg twice daily - Monitor heart rate and symptoms.

## 2023-05-22 NOTE — Assessment & Plan Note (Signed)
 Mildly elevated BP today. Likely related to discontinuation of diltiazem  - Refill diltiazem  360 mg prescription - Continue carvedilol  25 mg twice daily

## 2023-05-22 NOTE — Assessment & Plan Note (Signed)
 CHA2DS2-VASc or 5.  On Eliquis  for stroke prophylaxis.  - Okay to hold Eliquis  2 to 3 days preop for surgical procedures per protocol.

## 2023-05-22 NOTE — Assessment & Plan Note (Signed)
 Cholesterol levels normal as of June last year. Due for re-evaluation in July. - Schedule follow-up with Dr. Jonelle Neri in July for cholesterol and blood work evaluation. - Continue current dose of rosuvastatin  10 mg daily  A1c at 5.8% as of March 22, 2023, indicating good control. Mounjaro  aids in weight loss. - Continue Mounjaro  for diabetes management and weight loss.

## 2023-05-22 NOTE — Assessment & Plan Note (Signed)
 Working on weight loss with 10 pound weight loss since last visit. She is working on her diet and exercise and is now on Mounjaro  which will help to continue to assist with weight loss.

## 2023-06-04 ENCOUNTER — Ambulatory Visit (INDEPENDENT_AMBULATORY_CARE_PROVIDER_SITE_OTHER): Payer: Medicare HMO | Admitting: Podiatry

## 2023-06-04 DIAGNOSIS — M79675 Pain in left toe(s): Secondary | ICD-10-CM

## 2023-06-04 DIAGNOSIS — B351 Tinea unguium: Secondary | ICD-10-CM

## 2023-06-04 DIAGNOSIS — M79674 Pain in right toe(s): Secondary | ICD-10-CM

## 2023-06-04 NOTE — Progress Notes (Signed)
 Subjective:  Patient ID: Stacy Campbell, female    DOB: 1955-10-28,  MRN: [id number]  Stacy Campbell presents to clinic today for:  Chief Complaint  Patient presents with   [facility]    Nail trim  Pt stated that she has some slight discomfort in the corner of her right hallux    Patient notes nails are thick, discolored, elongated and painful in shoegear when trying to ambulate.  States her A1c was 5.8.  She notes some soreness along the right great toenail along the lateral border.  There appears to be a small spot of dried blood present but the patient does not recall an injury.  PCP is Luevenia Saha, MD.  Past Medical History:  Diagnosis Date   Angio-edema    Anxiety    Arthritis    "lower back" (01/29/2017)   Asthma    Bone spur    spine   Chronic lower back pain    Cirrhosis of liver (HCC) 12/2018   Depression    Diabetes mellitus without complication (HCC)    Eczema    GERD (gastroesophageal reflux disease)    Hepatitis C    was treated for this   Hyperplastic polyp of intestine    Hypertension    Insomnia    OSA (obstructive sleep apnea)    Pending formal sleep study   Osteoarthritis    Osteoporosis    Paroxysmal atrial fibrillation (HCC) 09/27/2021   cardioversion in 1990s; Noted to be in Afib-RVR when being evaluated by sleep medicine.   Splenic artery aneurysm (HCC)    Urticaria     Past Surgical History:  Procedure Laterality Date   CARDIOVERSION  1990s   CARDIOVERSION N/A 10/18/2021   Procedure: CARDIOVERSION;  Surgeon: Hugh Madura, MD;  Location: [facility] ENDOSCOPY;  Service: Cardiovascular;  Laterality: N/A;   CHOLECYSTECTOMY N/A 01/22/2018   Procedure: LAPAROSCOPIC CHOLECYSTECTOMY ERAS PATHWAY;  Surgeon: Adalberto Acton, MD;  Location: [facility] ORS;  Service: General;  Laterality: N/A;   CYSTOSCOPY W/ URETERAL STENT PLACEMENT Left 01/29/2017   Procedure: CYSTOSCOPY WITH RETROGRADE PYELOGRAM/URETERAL STENT PLACEMENT;  Surgeon: Andrez Banker, MD;  Location: [facility] OR;  Service: Urology;  Laterality: Left;   CYSTOSCOPY WITH RETROGRADE PYELOGRAM, URETEROSCOPY AND STENT PLACEMENT Left 01/29/2017   CYSTOSCOPY WITH RETROGRADE PYELOGRAM/URETERAL STENT PLACEMENT   NM MYOVIEW  LTD  10/28/2017   Nuclear stress EF: 63%. The left ventricular ejection fraction is normal (55-65%). There was no ST segment deviation noted during stress. The study is normal.  This is a low risk study.   NM MYOVIEW  LTD  11/06/2021   ?  Myocardial infarction located in the anterior region? ->  Despite this, there is a EF of 71% with no RWMA.  READ AS LOW RISK/LOW RISK; in fact, on review of images the anterior defect appeared to be consistent with breast attenuation.  No evidence of ischemia or infarction.  Preserved EF 71%.   TRANSTHORACIC ECHOCARDIOGRAM  01/30/2017   Normal LV size and function-hyperdynamic/vigorous.  EF 65 to 70%. No RWMA.  Focal basal hypertrophy - Minimal LVOT gradient (2.6 m/s).  GRII DD.  Calcified AoV with no stenosis.  Mild MAC.  Trivial TR.  Peak PAP 55 mmHg.    Allergies  Allergen Reactions   Hydrocodone  Itching   Linalool    Methylisothiazolinone Other (See Comments)    Other reaction(s): Other (See Comments) Positive patch test Positive patch test    Propylene Glycol  Aspirin  Itching and Other (See Comments)    Lower dose doesn't make her itch (she takes it every day)   Egg-Derived Products Nausea Only and Other (See Comments)    No reaction if in another food   Hydrocodone -Guaifenesin  Rash    Review of Systems: Negative except as noted in the HPI.  Objective:  Stacy Campbell is a pleasant 68 y.o. female in NAD. AAO x 3.  Vascular Examination: Capillary refill time is 3-5 seconds to toes bilateral. Palpable pedal pulses b/l LE. Digital hair present b/l.  Skin temperature gradient WNL b/l. No varicosities b/l. No cyanosis noted b/l. Minimal generalized edema bilateral legs/ankles.  Dermatological  Examination: Pedal skin with normal turgor, texture and tone b/l. No open wounds. No interdigital macerations b/l. Toenails x10 are 3mm thick, discolored, dystrophic with subungual debris. There is pain with compression of the nail plates.  They are elongated x10.  No onychocryptosis is noted on the right hallux lateral nail border but there is a small area of dried blood present.  This was able to be cleansed away after debridement of the toenail with no underlying ulceration noted.  No clinical signs of infection are seen     Latest Ref Rng & Units 03/22/2023   11:08 AM 12/20/2022    9:58 AM 10/01/2022    3:42 PM 06/20/2022   10:53 AM  Hemoglobin A1C  Hemoglobin-A1c 4.0 - 5.6 % 5.8  5.3  6.3  6.6    Assessment/Plan: 1. Pain due to onychomycosis of toenails of both feet    The mycotic toenails were sharply debrided x10 with sterile nail nippers and a power debriding burr to decrease bulk/thickness and length.    Return in about 3 months (around 09/04/2023) for [facility].   Joe Murders, DPM, FACFAS [facility]     [address] [place], [place] [address]                Office [phone]  Fax [phone]

## 2023-06-27 ENCOUNTER — Ambulatory Visit: Admitting: Family Medicine

## 2023-06-27 ENCOUNTER — Encounter: Payer: Self-pay | Admitting: Family Medicine

## 2023-06-27 VITALS — BP 136/72 | HR 66 | Temp 97.7°F | Ht 64.0 in | Wt 233.2 lb

## 2023-06-27 DIAGNOSIS — E1169 Type 2 diabetes mellitus with other specified complication: Secondary | ICD-10-CM

## 2023-06-27 DIAGNOSIS — J418 Mixed simple and mucopurulent chronic bronchitis: Secondary | ICD-10-CM

## 2023-06-27 DIAGNOSIS — I4819 Other persistent atrial fibrillation: Secondary | ICD-10-CM

## 2023-06-27 DIAGNOSIS — D472 Monoclonal gammopathy: Secondary | ICD-10-CM

## 2023-06-27 DIAGNOSIS — Z0000 Encounter for general adult medical examination without abnormal findings: Secondary | ICD-10-CM | POA: Diagnosis not present

## 2023-06-27 DIAGNOSIS — K7469 Other cirrhosis of liver: Secondary | ICD-10-CM

## 2023-06-27 DIAGNOSIS — E1159 Type 2 diabetes mellitus with other circulatory complications: Secondary | ICD-10-CM | POA: Diagnosis not present

## 2023-06-27 DIAGNOSIS — D6869 Other thrombophilia: Secondary | ICD-10-CM

## 2023-06-27 DIAGNOSIS — F339 Major depressive disorder, recurrent, unspecified: Secondary | ICD-10-CM | POA: Diagnosis not present

## 2023-06-27 DIAGNOSIS — E785 Hyperlipidemia, unspecified: Secondary | ICD-10-CM | POA: Diagnosis not present

## 2023-06-27 DIAGNOSIS — Z79899 Other long term (current) drug therapy: Secondary | ICD-10-CM

## 2023-06-27 DIAGNOSIS — G4733 Obstructive sleep apnea (adult) (pediatric): Secondary | ICD-10-CM | POA: Diagnosis not present

## 2023-06-27 DIAGNOSIS — E119 Type 2 diabetes mellitus without complications: Secondary | ICD-10-CM

## 2023-06-27 DIAGNOSIS — G8929 Other chronic pain: Secondary | ICD-10-CM

## 2023-06-27 DIAGNOSIS — F1120 Opioid dependence, uncomplicated: Secondary | ICD-10-CM

## 2023-06-27 DIAGNOSIS — J4489 Other specified chronic obstructive pulmonary disease: Secondary | ICD-10-CM

## 2023-06-27 DIAGNOSIS — Z0001 Encounter for general adult medical examination with abnormal findings: Secondary | ICD-10-CM

## 2023-06-27 DIAGNOSIS — I152 Hypertension secondary to endocrine disorders: Secondary | ICD-10-CM

## 2023-06-27 LAB — COMPREHENSIVE METABOLIC PANEL WITH GFR
ALT: 17 U/L (ref 0–35)
AST: 23 U/L (ref 0–37)
Albumin: 3.9 g/dL (ref 3.5–5.2)
Alkaline Phosphatase: 87 U/L (ref 39–117)
BUN: 14 mg/dL (ref 6–23)
CO2: 25 meq/L (ref 19–32)
Calcium: 9.1 mg/dL (ref 8.4–10.5)
Chloride: 106 meq/L (ref 96–112)
Creatinine, Ser: 0.8 mg/dL (ref 0.40–1.20)
GFR: 75.96 mL/min (ref >60.00)
Glucose, Bld: 112 mg/dL — ABNORMAL HIGH (ref 70–99)
Potassium: 4.2 meq/L (ref 3.5–5.1)
Sodium: 139 meq/L (ref 135–145)
Total Bilirubin: 0.3 mg/dL (ref 0.2–1.2)
Total Protein: 8 g/dL (ref 6.0–8.3)

## 2023-06-27 LAB — CBC WITH DIFFERENTIAL/PLATELET
Basophils Absolute: 0.1 K/uL (ref 0.0–0.1)
Basophils Relative: 0.8 % (ref 0.0–3.0)
Eosinophils Absolute: 0.4 K/uL (ref 0.0–0.7)
Eosinophils Relative: 5.9 % — ABNORMAL HIGH (ref 0.0–5.0)
HCT: 41.8 % (ref 36.0–46.0)
Hemoglobin: 13.4 g/dL (ref 12.0–15.0)
Lymphocytes Relative: 17.6 % (ref 12.0–46.0)
Lymphs Abs: 1.2 K/uL (ref 0.7–4.0)
MCHC: 32 g/dL (ref 30.0–36.0)
MCV: 76.9 fl — ABNORMAL LOW (ref 78.0–100.0)
Monocytes Absolute: 0.7 K/uL (ref 0.1–1.0)
Monocytes Relative: 9.8 % (ref 3.0–12.0)
Neutro Abs: 4.6 K/uL (ref 1.4–7.7)
Neutrophils Relative %: 65.9 % (ref 43.0–77.0)
Platelets: 260 K/uL (ref 150.0–400.0)
RBC: 5.44 Mil/uL — ABNORMAL HIGH (ref 3.87–5.11)
RDW: 15.4 % (ref 11.5–15.5)
WBC: 6.9 K/uL (ref 4.0–10.5)

## 2023-06-27 LAB — LIPID PANEL
Cholesterol: 126 mg/dL (ref 0–200)
HDL: 42.5 mg/dL
LDL Cholesterol: 58 mg/dL (ref 0–99)
NonHDL: 83.49
Total CHOL/HDL Ratio: 3
Triglycerides: 128 mg/dL (ref 0.0–149.0)
VLDL: 25.6 mg/dL (ref 0.0–40.0)

## 2023-06-27 LAB — HEMOGLOBIN A1C: Hgb A1c MFr Bld: 6.5 % (ref 4.6–6.5)

## 2023-06-27 LAB — MICROALBUMIN / CREATININE URINE RATIO
Creatinine,U: 98.9 mg/dL
Microalb Creat Ratio: UNDETERMINED mg/g (ref 0.0–30.0)
Microalb, Ur: <0.7 mg/dL

## 2023-06-27 LAB — TSH: TSH: 2.3 u[IU]/mL (ref 0.35–5.50)

## 2023-06-27 MED ORDER — MUPIROCIN 2 % EX OINT: TOPICAL_OINTMENT | CUTANEOUS | 0 refills | Status: DC

## 2023-06-27 MED ORDER — ROSUVASTATIN CALCIUM 10 MG PO TABS: 10.0000 mg | ORAL_TABLET | Freq: Every evening | ORAL | 3 refills | Status: AC

## 2023-06-27 NOTE — Progress Notes (Signed)
 Subjective  Chief Complaint  Patient presents with   Annual Exam    Pt here for Annual exam and is currently fasting    Diabetes   Headache    For the past 2 weeks and it is constant    HPI: Stacy Campbell is a 68 y.o. female who presents to [facility] [place] today for a Female Wellness Visit. She also has the concerns and/or needs as listed above in the chief complaint. These will be addressed in addition to the Health Maintenance Visit.   Wellness Visit: annual visit with health maintenance review and exam  Health maintenance: Mammogram is overdue since may. CRC is current. Chronic disease f/u and/or acute problem visit: (deemed necessary to be done in addition to the wellness visit): Discussed the use of AI scribe software for clinical note transcription with the patient, who gave verbal consent to proceed.  History of Present Illness Stacy Campbell is a 68 year old female with multiple chronic medical problems who presents for a complete physical and follow-up.  She has been experiencing headaches for the past two weeks, described as a 'nagging ache' that migrates around her head. The headaches are not alleviated by ibuprofen  or Tylenol , and she previously used [facility] powders for relief. The headaches are sometimes accompanied by dizziness, and she finds some relief with oxycodone , which she takes as needed. She has a history of sinus issues, which may contribute to the headaches.  She has a history of insomnia and was previously on Ambien , which was discontinued last year during a hospital stay. Since then, she has had difficulty sleeping, often not falling asleep until 4 AM. Her psychiatrist had previously prescribed Ambien , which she found effective.  Her diabetes management includes Mounjaro , and she mentions a recent weight gain. She was previously on Rexulti , which were discontinued due to concerns about weight gain. She is not currently on  metformin . No chest pain, blackouts, abdominal pain, sores on her feet, or pain in her calves when walking. Denies sxs of hyperglycemia.   She has a history of eczema, which has improved but still presents with some skin issues. She uses a specific ointment for flare-ups when the skin becomes red and infected.  She is not currently taking Crestor  for her hyperlipidemia, as she has not refilled it in over a year. She is unsure about her current cholesterol levels.    Assessment  1. Encounter for well adult exam with abnormal findings   2. Controlled type 2 diabetes mellitus without complication, without long-term current use of insulin (HCC)   3. Other cirrhosis of liver (HCC)   4. Asthma with COPD (HCC)   5. Hyperlipidemia associated with type 2 diabetes mellitus (HCC)   6. Chronic narcotic dependence (HCC)   7. Chronic low back pain with bilateral sciatica, unspecified back pain laterality   8. Hypertension associated with diabetes (HCC)   9. Major depression, recurrent, chronic (HCC)   10. Mixed simple and mucopurulent chronic bronchitis (HCC)   11. Monoclonal gammopathy of unknown significance (MGUS)   12. Persistent atrial fibrillation (HCC)   13. Polypharmacy   14. Secondary hypercoagulable state (HCC)   15. Obstructive sleep apnea hypopnea, mild      Plan  Female Wellness Visit: Age appropriate Health Maintenance and Prevention measures were discussed with patient. Included topics are cancer screening recommendations, ways to keep healthy (see AVS) including dietary and exercise recommendations, regular eye and dental care, use of seat belts,  and avoidance of moderate alcohol  use and tobacco use. Pt to schedule overdue mammogra BMI: discussed patient's BMI and encouraged positive lifestyle modifications to help get to or maintain a target BMI. HM needs and immunizations were addressed and ordered. See below for orders. See HM and immunization section for updates. Routine labs and  screening tests ordered including cmp, cbc and lipids where appropriate. Discussed recommendations regarding Vit D and calcium  supplementation (see AVS)  Chronic disease management visit and/or acute problem visit: Assessment and Plan Assessment & Plan Headache Chronic headaches with sinus component, ineffective response to ibuprofen  and acetaminophen . H/o rebound headaches and analgesic overuse headaches - Restart nasal spray for sinus-related headache relief.  Insomnia Persistent insomnia post-zolpidem  discontinuation, psychiatrist supports resumption for quality of life. - Resume zolpidem  for insomnia management per psych  Diabetes Mellitus Weight gain on mounjaro  7.5.  - Order blood work to assess diabetes control. - Consider metformin  based on blood work results. - work on diet improvement.  Hyperlipidemia Cholesterol management lapse, no rosuvastatin  for over a year. - Order blood work to assess cholesterol levels. - will need to restart crestor .  - Reassess cholesterol in three months.  HTN: This medical condition is well controlled. There are no signs of complications, medication side effects, or red flags. Patient is instructed to continue the current treatment plan without change in therapies or medications.   Cirrhosis: stable clinically  Asthma: stable  Depression per psych  Eczema Improved eczema, effective ointment for infected areas. - Refill ointment for infected eczema.  Follow-up Requires reassessment of health conditions and medication management. - Schedule follow-up in three months. - Review blood work and adjust medications as needed. - Contact if diabetes management changes based on blood work.   Follow up: 3 mo for recheck diabetes  Orders Placed This Encounter  Procedures   CBC with Differential/Platelet   Comprehensive metabolic panel with GFR   Lipid panel   Hemoglobin A1c   TSH   Microalbumin / creatinine urine ratio   No orders of  the defined types were placed in this encounter.     Body mass index is 40.03 kg/m. Wt Readings from Last 3 Encounters:  06/27/23 233 lb 3.2 oz (105.8 kg)  05/14/23 226 lb (102.5 kg)  03/22/23 219 lb 9.6 oz (99.6 kg)     Patient Active Problem List   Diagnosis Date Noted   Mixed simple and mucopurulent chronic bronchitis (HCC) 06/20/2022    Priority: High   Chronic narcotic dependence (HCC) 06/20/2022    Priority: High   Polypharmacy 06/20/2022    Priority: High   Persistent atrial fibrillation (HCC) 10/04/2021    Priority: High   Secondary hypercoagulable state (HCC) 10/04/2021    Priority: High   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 01/04/2020    Priority: High   Monoclonal gammopathy of unknown significance (MGUS) 01/23/2019    Priority: High   Controlled type 2 diabetes mellitus without complication, without long-term current use of insulin (HCC) 12/12/2018    Priority: High    Diagnosed 2020 Metformin  500 xr daily Mounjaro   Stopped metformin   03/2023 due to excellent diabetic control with mounjaro     Hyperlipidemia associated with type 2 diabetes mellitus (HCC) 12/12/2018    Priority: High    LDL goal of 70 given diabetes    Asthma with COPD (HCC) 05/25/2013    Priority: High   Morbid obesity     Priority: High   Major depression, recurrent, chronic (HCC) 03/07/2006  Priority: High    Sees psychiatry: Lafrances Pigeon, FNP; [facility] with Herschel Lords, PsyD    Hypertension associated with diabetes (HCC) 03/07/2006    Priority: High   Chronic low back pain 03/07/2006    Priority: High   Insomnia 03/07/2006    Priority: High    On chronic Ambien  (started by previous provider).     Obstructive sleep apnea hypopnea, mild 06/20/2022    Priority: Medium     2024, [facility]; mild. No treatment recommended    Aortic ejection murmur 12/06/2021    Priority: Medium    DJD (degenerative joint disease) of cervical spine 09/06/2021     Priority: Medium    Chronic urticaria 06/12/2021    Priority: Medium     Has had chronic pred use for years/eczema    Degenerative joint disease (DJD) of lumbar spine 01/04/2020    Priority: Medium    History of hepatitis C 10/11/2017    Priority: Medium     S/P Treatment with sofosbuvir and simeprivir.  Followed by hep clinic.  Undetectable viral load - Cured.    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   Health Maintenance  Topic Date Due   MAMMOGRAM  05/23/2023   Diabetic kidney evaluation - Urine ACR  06/20/2023   Medicare Annual Wellness (AWV)  07/30/2023   INFLUENZA VACCINE  08/09/2023   HEMOGLOBIN A1C  09/22/2023   Diabetic kidney evaluation - eGFR measurement  01/16/2024   FOOT EXAM  03/21/2024   DEXA SCAN  04/21/2024   OPHTHALMOLOGY EXAM  05/21/2024   Colonoscopy  12/14/2029   Pneumococcal Vaccine: 50+ Years  Completed   Hepatitis C Screening  Completed   HPV VACCINES  Aged Out   Meningococcal B Vaccine  Aged Out   DTaP/Tdap/Td  Discontinued   COVID-19 Vaccine  Discontinued   Zoster Vaccines- Shingrix  Discontinued   Immunization History  Administered Date(s) Administered   Fluad Quad(high Dose 65+) 11/18/2020, 10/06/2021   Fluad Trivalent(High Dose 65+) 10/01/2022   Hepatitis B, ADULT 10/12/2014, 11/09/2014, 04/11/2015   Influenza Split 11/14/2010, 10/09/2011   Influenza Whole 10/13/2007, 01/10/2009, 11/22/2009   Influenza,inj,Quad PF,6+ Mos 10/10/2012, 10/08/2013, 10/15/2014, 09/29/2015, 11/06/2016, 09/10/2017, 10/15/2018, 09/30/2019   PFIZER(Purple Top)SARS-COV-2 Vaccination 03/20/2019, 04/15/2019, 10/15/2019    PNEUMOCOCCAL CONJUGATE-20 11/18/2020   Pneumococcal Polysaccharide-23 01/10/2009   Td 01/09/1999, 01/10/2009   We updated and reviewed the patient's past history in detail and it is documented below. Allergies: Patient is allergic to hydrocodone , linalool, methylisothiazolinone, propylene glycol, aspirin , egg-derived products, and hydrocodone -guaifenesin . Past Medical History Patient  has a past medical history of Angio-edema, Anxiety, Arthritis, Asthma, Bone spur, Chronic lower back pain, Cirrhosis of liver (HCC) (12/2018), Depression, Diabetes mellitus without complication (HCC), Eczema, GERD (gastroesophageal reflux disease), Hepatitis C, Hyperplastic polyp of intestine, Hypertension, Insomnia, OSA (obstructive sleep apnea), Osteoarthritis, Osteoporosis, Paroxysmal atrial fibrillation (HCC) (09/27/2021), Splenic artery aneurysm (HCC), and Urticaria. Past Surgical History Patient  has a past surgical history that includes Cystoscopy with retrograde pyelogram, ureteroscopy and stent placement (Left, 01/29/2017); Cardioversion (1990s); Cystoscopy w/ ureteral stent placement (Left, 01/29/2017); Cholecystectomy (N/A, 01/22/2018); Cardioversion (N/A, 10/18/2021); NM  MYOVIEW  LTD (10/28/2017); transthoracic echocardiogram (01/30/2017); and NM MYOVIEW  LTD (11/06/2021). Family History: Patient family history includes Alcoholism in her brother; Asthma in her brother, brother, brother, brother, father, and mother; Cancer in her father; Diabetes in her brother, brother, brother, brother, father, and mother; Eczema in her father; Hepatitis B in her daughter. Social History:  Patient  reports that she quit smoking about 15 years ago. Her smoking use included cigarettes. She started smoking about 52 years ago. She has a 9.3 pack-year smoking history. She has never used smokeless tobacco. She reports that she does not drink alcohol  and does not use drugs.  Review of Systems: Constitutional: negative for fever or  malaise Ophthalmic: negative for photophobia, double vision or loss of vision Cardiovascular: negative for chest pain, dyspnea on exertion, or new LE swelling Respiratory: negative for SOB or persistent cough Gastrointestinal: negative for abdominal pain, change in bowel habits or melena Genitourinary: negative for dysuria or gross hematuria, no abnormal uterine bleeding or disharge Musculoskeletal: negative for new gait disturbance or muscular weakness Integumentary: negative for new or persistent rashes, no breast lumps Neurological: negative for TIA or stroke symptoms Psychiatric: negative for SI or delusions   Patient Care Team    Relationship Specialty Notifications Start End  Luevenia Saha, MD PCP - General Family Medicine  09/12/22   Arleen Lacer, MD PCP - Cardiology Cardiology  10/30/21   Evangeline Hilts, MD Consulting Physician Gastroenterology  01/04/20   Flonnie Humphrey, NP Nurse Practitioner Psychology  01/04/20   Myrle Aspen, RPH (Inactive)  Pharmacist  10/10/20     Objective  Vitals: BP 136/72   Pulse 66   Temp 97.7 F (36.5 C)   Ht 5' 4 (1.626 m)   Wt 233 lb 3.2 oz (105.8 kg)   SpO2 98%   BMI 40.03 kg/m  General:  Well developed, well nourished, no acute distress  Psych:  Alert and orientedx3,normal mood and affect HEENT:  Normocephalic, atraumatic, non-icteric sclera,  supple neck without adenopathy, mass or thyromegaly Cardiovascular:  Normal S1, S2, RRR without gallop, rub + murmur Respiratory:  Good breath sounds bilaterally, CTAB with normal respiratory effort Gastrointestinal: normal bowel sounds, soft, non-tender, no noted masses. No HSM MSK: extremities without edema, joints without erythema or swelling Neurologic:    Mental status is normal.    Commons side effects, risks, benefits, and alternatives for medications and treatment plan prescribed today were discussed, and the patient expressed understanding of the given instructions.  Patient is instructed to call or message via MyChart if he/she has any questions or concerns regarding our treatment plan. No barriers to understanding were identified. We discussed Red Flag symptoms and signs in detail. Patient expressed understanding regarding what to do in case of urgent or emergency type symptoms.  Medication list was reconciled, printed and provided to the patient in AVS. Patient instructions and summary information was reviewed with the patient as documented in the AVS. This note was prepared with assistance of Dragon voice recognition software. Occasional wrong-word or sound-a-like substitutions may have occurred due to the inherent limitations of voice recognition software

## 2023-06-27 NOTE — Patient Instructions (Signed)
 Please return in 3 months to recheck diabetes and blood pressure    I will release your lab results to you on your MyChart account with further instructions. You may see the results before I do, but when I review them I will send you a message with my report or have my assistant call you if things need to be discussed. Please reply to my message with any questions. Thank you!   If you have any questions or concerns, please don't hesitate to send me a message via MyChart or call the office at [phone]. Thank you for visiting with us  today! It's our pleasure caring for you.    VISIT SUMMARY: You came in today for a complete physical and follow-up on your chronic medical conditions. We discussed your recent headaches, insomnia, diabetes management, hyperlipidemia, and eczema.  YOUR PLAN: -HEADACHE: You have been experiencing chronic headaches, possibly related to your sinuses. We will restart your nasal spray to help relieve these headaches.  -INSOMNIA: You have had trouble sleeping since stopping Ambien . We will resume your prescription for zolpidem  to help manage your insomnia.  -DIABETES MELLITUS: Your diabetes management is under review due to recent weight gain. We will order blood work to assess your diabetes control and may consider restarting metformin  based on the results.  -HYPERLIPIDEMIA: You have not taken your cholesterol medication, rosuvastatin , for over a year. We will order blood work to check your cholesterol levels and restart your prescription for rosuvastatin . We will reassess your cholesterol in three months.  -ECZEMA: Your eczema has improved, but you still have some flare-ups. We will refill your ointment to use when your skin becomes red and infected.  INSTRUCTIONS: Please schedule a follow-up appointment in three months. We will review your blood work and adjust your medications as needed. Contact us  if there are any changes in your diabetes management based on the  blood work results.                      Contains text generated by Abridge.                                 Contains text generated by Abridge.

## 2023-06-29 ENCOUNTER — Ambulatory Visit: Payer: Self-pay | Admitting: Family Medicine

## 2023-06-29 NOTE — Progress Notes (Signed)
 Please call pt: her cholesterol levels are very low indicating that she likely is taking her rosuvastatin ? Please have her check her medication bottles to verify. Diabetic control is fair. No need to restart metformin  at this time, but she could if she wanted to push the A1c < 6.5. we dont want this number to get any higher. All other labs are stable.

## 2023-07-01 ENCOUNTER — Other Ambulatory Visit: Payer: Self-pay | Admitting: Family Medicine

## 2023-07-01 ENCOUNTER — Other Ambulatory Visit: Payer: Self-pay

## 2023-07-01 DIAGNOSIS — E119 Type 2 diabetes mellitus without complications: Secondary | ICD-10-CM

## 2023-07-01 DIAGNOSIS — Z1231 Encounter for screening mammogram for malignant neoplasm of breast: Secondary | ICD-10-CM

## 2023-07-01 MED ORDER — METFORMIN HCL ER 500 MG PO TB24: 500.0000 mg | ORAL_TABLET | Freq: Every day | ORAL | 3 refills | Status: AC

## 2023-07-11 ENCOUNTER — Other Ambulatory Visit: Payer: Self-pay | Admitting: Cardiology

## 2023-07-11 DIAGNOSIS — I4819 Other persistent atrial fibrillation: Secondary | ICD-10-CM

## 2023-07-11 NOTE — Telephone Encounter (Signed)
 Prescription refill request for Eliquis  received. Indication: a fib Last office visit: 05/14/23 Scr: 0.8 epic 06/27/23 Age: 68 Weight: 105kg

## 2023-07-18 ENCOUNTER — Ambulatory Visit
Admission: RE | Admit: 2023-07-18 | Discharge: 2023-07-18 | Disposition: A | Discharge status: Home / Self Care | Source: Ambulatory Visit | Attending: Family Medicine | Admitting: Family Medicine

## 2023-07-18 DIAGNOSIS — Z1231 Encounter for screening mammogram for malignant neoplasm of breast: Secondary | ICD-10-CM

## 2023-07-21 ENCOUNTER — Observation Stay (HOSPITAL_COMMUNITY)
Admission: EM | Admit: 2023-07-21 | Discharge: 2023-07-22 | Disposition: A | Discharge status: Home / Self Care | Attending: Internal Medicine | Admitting: Internal Medicine

## 2023-07-21 ENCOUNTER — Encounter (HOSPITAL_COMMUNITY): Payer: Self-pay | Admitting: *Deleted

## 2023-07-21 ENCOUNTER — Other Ambulatory Visit: Payer: Self-pay

## 2023-07-21 ENCOUNTER — Emergency Department (HOSPITAL_COMMUNITY)

## 2023-07-21 DIAGNOSIS — T796XXA Traumatic ischemia of muscle, initial encounter: Principal | ICD-10-CM | POA: Insufficient documentation

## 2023-07-21 DIAGNOSIS — K7460 Unspecified cirrhosis of liver: Secondary | ICD-10-CM | POA: Insufficient documentation

## 2023-07-21 DIAGNOSIS — M6282 Rhabdomyolysis: Secondary | ICD-10-CM | POA: Diagnosis present

## 2023-07-21 DIAGNOSIS — R531 Weakness: Secondary | ICD-10-CM

## 2023-07-21 DIAGNOSIS — Z7901 Long term (current) use of anticoagulants: Secondary | ICD-10-CM | POA: Insufficient documentation

## 2023-07-21 DIAGNOSIS — N390 Urinary tract infection, site not specified: Secondary | ICD-10-CM | POA: Diagnosis not present

## 2023-07-21 DIAGNOSIS — M25572 Pain in left ankle and joints of left foot: Secondary | ICD-10-CM | POA: Insufficient documentation

## 2023-07-21 DIAGNOSIS — E119 Type 2 diabetes mellitus without complications: Secondary | ICD-10-CM | POA: Insufficient documentation

## 2023-07-21 DIAGNOSIS — J449 Chronic obstructive pulmonary disease, unspecified: Secondary | ICD-10-CM | POA: Diagnosis not present

## 2023-07-21 DIAGNOSIS — W19XXXA Unspecified fall, initial encounter: Principal | ICD-10-CM | POA: Insufficient documentation

## 2023-07-21 DIAGNOSIS — I480 Paroxysmal atrial fibrillation: Secondary | ICD-10-CM | POA: Insufficient documentation

## 2023-07-21 DIAGNOSIS — G4733 Obstructive sleep apnea (adult) (pediatric): Secondary | ICD-10-CM | POA: Diagnosis not present

## 2023-07-21 DIAGNOSIS — J45909 Unspecified asthma, uncomplicated: Secondary | ICD-10-CM | POA: Insufficient documentation

## 2023-07-21 DIAGNOSIS — R54 Age-related physical debility: Secondary | ICD-10-CM | POA: Diagnosis not present

## 2023-07-21 LAB — CBC WITH DIFFERENTIAL/PLATELET
Abs Immature Granulocytes: 0.04 K/uL (ref 0.00–0.07)
Basophils Absolute: 0 K/uL (ref 0.0–0.1)
Basophils Relative: 0 %
Eosinophils Absolute: 0.3 K/uL (ref 0.0–0.5)
Eosinophils Relative: 3 %
HCT: 42.2 % (ref 36.0–46.0)
Hemoglobin: 13.3 g/dL (ref 12.0–15.0)
Immature Granulocytes: 0 %
Lymphocytes Relative: 14 %
Lymphs Abs: 1.7 K/uL (ref 0.7–4.0)
MCH: 25.3 pg — ABNORMAL LOW (ref 26.0–34.0)
MCHC: 31.5 g/dL (ref 30.0–36.0)
MCV: 80.4 fL (ref 80.0–100.0)
Monocytes Absolute: 0.9 K/uL (ref 0.1–1.0)
Monocytes Relative: 8 %
Neutro Abs: 9 K/uL — ABNORMAL HIGH (ref 1.7–7.7)
Neutrophils Relative %: 75 %
Platelets: 273 K/uL (ref 150–400)
RBC: 5.25 MIL/uL — ABNORMAL HIGH (ref 3.87–5.11)
RDW: 14.2 % (ref 11.5–15.5)
WBC: 11.9 K/uL — ABNORMAL HIGH (ref 4.0–10.5)
nRBC: 0 % (ref 0.0–0.2)

## 2023-07-21 LAB — COMPREHENSIVE METABOLIC PANEL WITH GFR
ALT: 22 U/L (ref 0–44)
AST: 38 U/L (ref 15–41)
Albumin: 3.5 g/dL (ref 3.5–5.0)
Alkaline Phosphatase: 85 U/L (ref 38–126)
Anion gap: 14 (ref 5–15)
BUN: 11 mg/dL (ref 8–23)
CO2: 21 mmol/L — ABNORMAL LOW (ref 22–32)
Calcium: 9.3 mg/dL (ref 8.9–10.3)
Chloride: 105 mmol/L (ref 98–111)
Creatinine, Ser: 1 mg/dL (ref 0.44–1.00)
GFR, Estimated: >60 mL/min (ref >60)
Glucose, Bld: 104 mg/dL — ABNORMAL HIGH (ref 70–99)
Potassium: 3.5 mmol/L (ref 3.5–5.1)
Sodium: 140 mmol/L (ref 135–145)
Total Bilirubin: 0.7 mg/dL (ref 0.0–1.2)
Total Protein: 7.7 g/dL (ref 6.5–8.1)

## 2023-07-21 LAB — RESP PANEL BY RT-PCR (RSV, FLU A&B, COVID)  RVPGX2
Influenza A by PCR: NEGATIVE
Influenza B by PCR: NEGATIVE
Resp Syncytial Virus by PCR: NEGATIVE
SARS Coronavirus 2 by RT PCR: NEGATIVE

## 2023-07-21 LAB — CK: Total CK: 1928 U/L — ABNORMAL HIGH (ref 38–234)

## 2023-07-21 NOTE — ED Provider Triage Note (Signed)
 Emergency Medicine Provider Triage Evaluation Note  JEANIA NATER , a 68 y.o. female  was evaluated in triage.  Pt complains of 2 falls that occurred today.  1 around 5 AM getting out of bed.  She laid on the floor for an hour after the fall until someone could help her.  She had another fall 20 minutes ago.  Denies hitting her head during either fall.  She is adamant about this.  States she fell onto her bottom.  Endorses weakness.  Review of Systems  Positive: As above Negative: As above  Physical Exam  BP 125/70 (BP Location: Right Arm)   Pulse 98   Temp 98.6 F (37 C) (Oral)   Resp 14   SpO2 95%  Gen:   Awake, no distress   Resp:  Normal effort  MSK:   Moves extremities without difficulty  Other:    Medical Decision Making  Medically screening exam initiated at 8:04 PM.  Appropriate orders placed.  Amerah D Bal was informed that the remainder of the evaluation will be completed by another provider, this initial triage assessment does not replace that evaluation, and the importance of remaining in the ED until their evaluation is complete.     Hildegard Loge, PA-C 07/21/23 2005

## 2023-07-21 NOTE — ED Triage Notes (Signed)
 The pt has fallen x 2 today one she tripped on some toys as she was leaving her bed and one other fall today  she did not strike her head no loc   she is c/o back pain    she takes eliquis 

## 2023-07-21 NOTE — ED Notes (Signed)
 Pt missed the container when she voided

## 2023-07-22 ENCOUNTER — Inpatient Hospital Stay (HOSPITAL_COMMUNITY)

## 2023-07-22 ENCOUNTER — Emergency Department (HOSPITAL_COMMUNITY)

## 2023-07-22 DIAGNOSIS — N390 Urinary tract infection, site not specified: Secondary | ICD-10-CM

## 2023-07-22 DIAGNOSIS — R54 Age-related physical debility: Secondary | ICD-10-CM

## 2023-07-22 DIAGNOSIS — M6282 Rhabdomyolysis: Secondary | ICD-10-CM | POA: Diagnosis present

## 2023-07-22 DIAGNOSIS — T796XXA Traumatic ischemia of muscle, initial encounter: Secondary | ICD-10-CM

## 2023-07-22 DIAGNOSIS — M25572 Pain in left ankle and joints of left foot: Secondary | ICD-10-CM

## 2023-07-22 DIAGNOSIS — Y92009 Unspecified place in unspecified non-institutional (private) residence as the place of occurrence of the external cause: Secondary | ICD-10-CM

## 2023-07-22 DIAGNOSIS — W19XXXA Unspecified fall, initial encounter: Secondary | ICD-10-CM

## 2023-07-22 LAB — COMPREHENSIVE METABOLIC PANEL WITH GFR
ALT: 20 U/L (ref 0–44)
AST: 30 U/L (ref 15–41)
Albumin: 3 g/dL — ABNORMAL LOW (ref 3.5–5.0)
Alkaline Phosphatase: 69 U/L (ref 38–126)
Anion gap: 12 (ref 5–15)
BUN: 9 mg/dL (ref 8–23)
CO2: 22 mmol/L (ref 22–32)
Calcium: 8.7 mg/dL — ABNORMAL LOW (ref 8.9–10.3)
Chloride: 104 mmol/L (ref 98–111)
Creatinine, Ser: 0.89 mg/dL (ref 0.44–1.00)
GFR, Estimated: >60 mL/min (ref >60)
Glucose, Bld: 124 mg/dL — ABNORMAL HIGH (ref 70–99)
Potassium: 3.7 mmol/L (ref 3.5–5.1)
Sodium: 138 mmol/L (ref 135–145)
Total Bilirubin: 0.8 mg/dL (ref 0.0–1.2)
Total Protein: 6.7 g/dL (ref 6.5–8.1)

## 2023-07-22 LAB — URINALYSIS, ROUTINE W REFLEX MICROSCOPIC
Bilirubin Urine: NEGATIVE
Glucose, UA: NEGATIVE mg/dL
Hgb urine dipstick: NEGATIVE
Ketones, ur: 20 mg/dL — AB
Nitrite: NEGATIVE
Protein, ur: 30 mg/dL — AB
Specific Gravity, Urine: 1.023 (ref 1.005–1.030)
pH: 5 (ref 5.0–8.0)

## 2023-07-22 LAB — CBC
HCT: 39.7 % (ref 36.0–46.0)
Hemoglobin: 12.3 g/dL (ref 12.0–15.0)
MCH: 25.1 pg — ABNORMAL LOW (ref 26.0–34.0)
MCHC: 31 g/dL (ref 30.0–36.0)
MCV: 80.9 fL (ref 80.0–100.0)
Platelets: 214 K/uL (ref 150–400)
RBC: 4.91 MIL/uL (ref 3.87–5.11)
RDW: 14.3 % (ref 11.5–15.5)
WBC: 9.2 K/uL (ref 4.0–10.5)
nRBC: 0 % (ref 0.0–0.2)

## 2023-07-22 MED ORDER — OXYCODONE HCL 5 MG PO TABS
5.0000 mg | ORAL_TABLET | Freq: Four times a day (QID) | ORAL | Status: DC | PRN
  Administered 2023-07-22 (×2): 5 mg via ORAL
  Filled 2023-07-22 (×2): qty 1

## 2023-07-22 MED ORDER — ROSUVASTATIN CALCIUM 5 MG PO TABS
10.0000 mg | ORAL_TABLET | Freq: Every day | ORAL | Status: DC
  Administered 2023-07-22: 10 mg via ORAL
  Filled 2023-07-22: qty 2

## 2023-07-22 MED ORDER — HEPARIN SODIUM (PORCINE) 5000 UNIT/ML IJ SOLN: 5000.0000 [IU] | Freq: Three times a day (TID) | INTRAMUSCULAR | Status: DC

## 2023-07-22 MED ORDER — CARVEDILOL 25 MG PO TABS
25.0000 mg | ORAL_TABLET | Freq: Two times a day (BID) | ORAL | Status: DC
  Administered 2023-07-22: 25 mg via ORAL
  Filled 2023-07-22: qty 1

## 2023-07-22 MED ORDER — ACETAMINOPHEN 650 MG RE SUPP
650.0000 mg | Freq: Four times a day (QID) | RECTAL | Status: DC | PRN
Start: 2023-07-22 — End: 2023-07-22

## 2023-07-22 MED ORDER — OXYCODONE-ACETAMINOPHEN 5-325 MG PO TABS
1.0000 | ORAL_TABLET | Freq: Four times a day (QID) | ORAL | Status: DC | PRN
  Administered 2023-07-22 (×2): 1 via ORAL
  Filled 2023-07-22 (×2): qty 1

## 2023-07-22 MED ORDER — BUSPIRONE HCL 10 MG PO TABS
15.0000 mg | ORAL_TABLET | Freq: Three times a day (TID) | ORAL | Status: DC
  Administered 2023-07-22: 15 mg via ORAL
  Filled 2023-07-22: qty 2

## 2023-07-22 MED ORDER — MYCOPHENOLATE MOFETIL 250 MG PO CAPS
500.0000 mg | ORAL_CAPSULE | Freq: Two times a day (BID) | ORAL | Status: DC
  Administered 2023-07-22: 500 mg via ORAL
  Filled 2023-07-22 (×2): qty 2

## 2023-07-22 MED ORDER — DILTIAZEM HCL ER COATED BEADS 180 MG PO CP24
360.0000 mg | ORAL_CAPSULE | Freq: Every day | ORAL | Status: DC
  Administered 2023-07-22: 360 mg via ORAL
  Filled 2023-07-22: qty 2

## 2023-07-22 MED ORDER — LACTATED RINGERS IV BOLUS
1000.0000 mL | Freq: Once | INTRAVENOUS | Status: AC
  Administered 2023-07-22: 1000 mL via INTRAVENOUS

## 2023-07-22 MED ORDER — OXYCODONE-ACETAMINOPHEN 10-325 MG PO TABS: 1.0000 | ORAL_TABLET | Freq: Four times a day (QID) | ORAL | Status: DC | PRN

## 2023-07-22 MED ORDER — MONTELUKAST SODIUM 10 MG PO TABS: 10.0000 mg | ORAL_TABLET | Freq: Every day | ORAL | Status: DC

## 2023-07-22 MED ORDER — SODIUM CHLORIDE 0.9 % IV SOLN: 1.0000 g | Freq: Once | INTRAVENOUS | Status: DC

## 2023-07-22 MED ORDER — ONDANSETRON HCL 4 MG PO TABS
4.0000 mg | ORAL_TABLET | Freq: Four times a day (QID) | ORAL | Status: DC | PRN
Start: 2023-07-22 — End: 2023-07-22

## 2023-07-22 MED ORDER — OXYCODONE-ACETAMINOPHEN 10-325 MG PO TABS: 1.0000 | ORAL_TABLET | Freq: Every day | ORAL | Status: DC

## 2023-07-22 MED ORDER — ACETAMINOPHEN 325 MG PO TABS: 650.0000 mg | ORAL_TABLET | Freq: Four times a day (QID) | ORAL | Status: DC | PRN

## 2023-07-22 MED ORDER — ONDANSETRON HCL 4 MG/2ML IJ SOLN: 4.0000 mg | Freq: Four times a day (QID) | INTRAMUSCULAR | Status: DC | PRN

## 2023-07-22 MED ORDER — APIXABAN 5 MG PO TABS
5.0000 mg | ORAL_TABLET | Freq: Two times a day (BID) | ORAL | Status: DC
  Administered 2023-07-22: 5 mg via ORAL
  Filled 2023-07-22: qty 1

## 2023-07-22 MED ORDER — SODIUM CHLORIDE 0.9 % IV SOLN
1.0000 g | Freq: Once | INTRAVENOUS | Status: AC
  Administered 2023-07-22: 1 g via INTRAVENOUS
  Filled 2023-07-22: qty 10

## 2023-07-22 MED ORDER — SODIUM CHLORIDE 0.9 % IV SOLN: 1.0000 g | INTRAVENOUS | Status: DC

## 2023-07-22 MED ORDER — ALBUTEROL SULFATE (2.5 MG/3ML) 0.083% IN NEBU: 2.5000 mg | INHALATION_SOLUTION | RESPIRATORY_TRACT | Status: DC | PRN

## 2023-07-22 MED ORDER — SODIUM CHLORIDE 0.9 % IV SOLN: INTRAVENOUS | Status: DC

## 2023-07-22 NOTE — Evaluation (Signed)
 Physical Therapy Evaluation Patient Details Name: Stacy Campbell MRN: [id number] DOB: 1955/09/13 Today's Date: 07/22/2023  History of Present Illness  Pt is a 68 y.o. F presenting to [facility] on 07/21/23 w/ weakness and recurrent mechanical falls at home. PMH is significant for DMII, liver cirrhosis, HLD, asthma/COPD, HTN, MDE, MGUS, and A-fib.  Clinical Impression  Prior to admittance pt was independent with mobility and ADLs, reporting having 4 falls w/in the past 6 months. Pt presents to evaluation with deficits in mobility, strength, power, balance, activity tolerance, and pain, all limiting pt's ability to mobilize near baseline. Pt was able to ambulate community level distances w/ an AD and no physical assistance, and negotiated stairs laterally w/out an AD and no physical assistance given. Pt would benefit from further gait and stair training, and balance training. PT will continue to treat pt while she is admitted. Recommending HHPT at discharge to address remaining mobility deficits and optimize return to PLOF.         If plan is discharge home, recommend the following: A little help with walking and/or transfers;A little help with bathing/dressing/bathroom;Assist for transportation;Help with stairs or ramp for entrance   Can travel by private vehicle        Equipment Recommendations Rolling walker (2 wheels)  Recommendations for Other Services       Functional Status Assessment Patient has had a recent decline in their functional status and demonstrates the ability to make significant improvements in function in a reasonable and predictable amount of time.     Precautions / Restrictions Precautions Precautions: Fall Recall of Precautions/Restrictions: Intact Restrictions Weight Bearing Restrictions Per Provider Order: No      Mobility  Bed Mobility Overal bed mobility: Needs Assistance Bed Mobility: Supine to Sit, Sit to Supine     Supine to sit: Supervision, HOB  elevated, Used rails Sit to supine: Min assist, HOB elevated, Used rails   General bed mobility comments: Attempted to use RLE to assist LLE into bed, but unable to complete, requiring min A for LLE management.    Transfers Overall transfer level: Needs assistance Equipment used: None, Rolling walker (2 wheels) Transfers: Sit to/from Stand Sit to Stand: Contact guard assist           General transfer comment: STS from EOB w/out an AD and then w/ a RW. VC given for sequencing, UE and LE placement. Increased time to complete. Pt requiring several attempts w/ VC for anterior trunk lean in order to complete to task.    Ambulation/Gait Ambulation/Gait assistance: Contact guard assist Gait Distance (Feet): 400 Feet Assistive device: Rolling walker (2 wheels) Gait Pattern/deviations: Step-to pattern, Decreased step length - left, Decreased stance time - left, Decreased weight shift to left, Antalgic Gait velocity: reduced Gait velocity interpretation: <1.8 ft/sec, indicate of risk for recurrent falls   General Gait Details: Pt ambualtes w/ step to gait pattern and increased reliance on RW for improved stability. Pt demonstrates reduced stance time on LLE and reduced step length on RLE.  Stairs Stairs: Yes Stairs assistance: Contact guard assist Stair Management: One rail Left, Step to pattern, Sideways Number of Stairs: 3 General stair comments: Step-to pattern w/ BUE support on L railing. Significant anterior trunk lean. Demonstrates good control throughout.  Wheelchair Mobility     Tilt Bed    Modified Rankin (Stroke Patients Only)       Balance Overall balance assessment: Needs assistance Sitting-balance support: Feet supported, No upper extremity supported Sitting balance-Leahy Scale: Fair Sitting  balance - Comments: seated EOB   Standing balance support: Bilateral upper extremity supported, During functional activity, Reliant on assistive device for balance Standing  balance-Leahy Scale: Poor Standing balance comment: reliant on external support                             Pertinent Vitals/Pain Pain Assessment Pain Assessment: Faces Faces Pain Scale: Hurts little more Pain Location: L ankle Pain Descriptors / Indicators: Discomfort, Grimacing, Guarding Pain Intervention(s): Limited activity within patient's tolerance, Monitored during session    Home Living Family/patient expects to be discharged to:: Private residence Living Arrangements: Other relatives;Children (daughter, brother, great-grandson) Available Help at Discharge: Family;Available 24 hours/day (grandson) Type of Home: House Home Access: Stairs to enter Entrance Stairs-Rails: Doctor, general practice of Steps: 5   Home Layout: One level Home Equipment: None      Prior Function Prior Level of Function : Independent/Modified Independent             Mobility Comments: independent w/out an AD ADLs Comments: independent     Extremity/Trunk Assessment   Upper Extremity Assessment Upper Extremity Assessment: Defer to OT evaluation    Lower Extremity Assessment Lower Extremity Assessment: Generalized weakness    Cervical / Trunk Assessment Cervical / Trunk Assessment: Kyphotic  Communication   Communication Communication: No apparent difficulties    Cognition Arousal: Alert Behavior During Therapy: WFL for tasks assessed/performed   PT - Cognitive impairments: No apparent impairments                         Following commands: Intact       Cueing Cueing Techniques: Verbal cues, Visual cues     General Comments General comments (skin integrity, edema, etc.): no signs of acute distress    Exercises     Assessment/Plan    PT Assessment Patient needs continued PT services  PT Problem List Decreased strength;Decreased activity tolerance;Decreased balance;Decreased mobility;Decreased coordination;Decreased knowledge of use of  DME;Pain       PT Treatment Interventions DME instruction;Gait training;Stair training;Functional mobility training;Therapeutic activities;Therapeutic exercise;Balance training;Neuromuscular re-education;Patient/family education;Wheelchair mobility training;Manual techniques;Modalities    PT Goals (Current goals can be found in the Care Plan section)  Acute Rehab PT Goals Patient Stated Goal: to go home PT Goal Formulation: With patient Time For Goal Achievement: 08/05/23 Potential to Achieve Goals: Good    Frequency Min 2X/week     Co-evaluation               AM-PAC PT 6 Clicks Mobility  Outcome Measure Help needed turning from your back to your side while in a flat bed without using bedrails?: A Little Help needed moving from lying on your back to sitting on the side of a flat bed without using bedrails?: A Little Help needed moving to and from a bed to a chair (including a wheelchair)?: A Little Help needed standing up from a chair using your arms (e.g., wheelchair or bedside chair)?: A Little Help needed to walk in hospital room?: A Little Help needed climbing 3-5 steps with a railing? : A Little 6 Click Score: 18    End of Session Equipment Utilized During Treatment: Gait belt Activity Tolerance: Patient tolerated treatment well Patient left: in bed;with call bell/phone within reach;with bed alarm set Nurse Communication: Mobility status PT Visit Diagnosis: Unsteadiness on feet (R26.81);Other abnormalities of gait and mobility (R26.89);Repeated falls (R29.6);Muscle weakness (generalized) (M62.81);History of falling (Z91.81)  Time: 0921-1005 PT Time Calculation (min) (ACUTE ONLY): 44 min   Charges:   PT Evaluation $PT Eval Low Complexity: 1 Low PT Treatments $Gait Training: 8-22 mins PT General Charges $$ ACUTE PT VISIT: 1 Visit         Leontine Hilt, SPT Acute Rehab [phone]   Leontine Hilt 07/22/2023, 10:22 AM

## 2023-07-22 NOTE — Care Management CC44 (Cosign Needed)
 Condition Code 44 Documentation Completed  Patient Details  Name: Stacy Campbell MRN: [id number] Date of Birth: May 14, 1955   Condition Code 44 given:  Yes Patient signature on Condition Code 44 notice:  Yes Documentation of 2 MD's agreement:  Yes Code 44 added to claim:  Yes    [name], RN 07/22/2023, 2:42 PM

## 2023-07-22 NOTE — ED Provider Notes (Signed)
 [place] EMERGENCY DEPARTMENT AT [facility] Provider Note   CSN: [id number] Arrival date & time: 07/21/23  1956     Patient presents with: Stacy Campbell Tonette is a 68 y.o. female.   Patient presents to the emergency department for evaluation of generalized weakness causing falls.  Patient has had 2 falls today.  She was unable to get up on her own after either fall.  Family came over and helped her after the first fall, came back later and found her on the ground again.  Patient unsure how long she was on the ground.  No loss of consciousness.  Complains of pain in the buttock area.  No headache, neck pain.  No dizziness or passing out.       Prior to Admission medications   Medication Sig Start Date End Date Taking? Authorizing Provider  albuterol  (VENTOLIN  HFA) 108 (90 Base) MCG/ACT inhaler Inhale 2 puffs into the lungs every 6 (six) hours as needed for wheezing or shortness of breath. 04/05/23  Yes [name], MD  Aspirin -Salicylamide-Caffeine (ARTHRITIS STRENGTH BC POWDER PO) Take 1 packet by mouth daily as needed.   Yes [provider]  busPIRone  (BUSPAR ) 15 MG tablet Take 15 mg by mouth 3 (three) times daily. 05/27/23  Yes [provider]  carvedilol  (COREG ) 25 MG tablet TAKE 1 TABLET(25 MG) BY MOUTH TWICE DAILY WITH A MEAL 12/15/21  Yes [name], MD  cetirizine  (ZYRTEC ) 10 MG tablet Take 10 mg by mouth daily. 09/28/21  Yes [provider]  clobetasol  ointment (TEMOVATE ) 0.05 % Apply 1 Application topically 2 (two) times daily as needed.   Yes [provider]  desvenlafaxine (PRISTIQ) 100 MG 24 hr tablet Take 100 mg by mouth daily. 07/20/23  Yes [provider]  diltiazem  (CARDIZEM  CD) 360 MG 24 hr capsule Take 1 capsule (360 mg total) by mouth daily. 05/14/23  Yes Anner Alm ORN, MD  doxepin  (SINEQUAN ) 25 MG capsule Take 50 mg by mouth 2 (two) times daily. 02/11/23  Yes [provider]  ELIQUIS  5 MG  TABS tablet TAKE 1 TABLET(5 MG) BY MOUTH TWICE DAILY 07/11/23  Yes Anner Alm ORN, MD  furosemide  (LASIX ) 20 MG tablet Take 1 tablet (20 mg total) by mouth daily as needed for edema. Take with potassium 01/16/23  Yes [name], MD  ibuprofen  (ADVIL ) 200 MG tablet Take 200 mg by mouth every 6 (six) hours as needed for mild pain (pain score 1-3).   Yes [provider]  meloxicam  (MOBIC ) 15 MG tablet Take 15 mg by mouth daily.   Yes [provider]  metFORMIN  (GLUCOPHAGE -XR) 500 MG 24 hr tablet Take 1 tablet (500 mg total) by mouth daily with breakfast. 07/01/23  Yes [name], MD  montelukast  (SINGULAIR ) 10 MG tablet Take 10 mg by mouth daily.   Yes [provider]  mupirocin  ointment (BACTROBAN ) 2 % Use twice a day for 7 days as needed for skin infections. 06/27/23  Yes [name], MD  mycophenolate  (CELLCEPT ) 500 MG tablet Take 500 mg by mouth 2 (two) times daily. 02/11/23  Yes [provider]  nystatin  ointment (MYCOSTATIN ) Apply 1 Application topically 2 (two) times daily. 02/11/23 02/11/24 Yes [provider]  oxyCODONE -acetaminophen  (PERCOCET ) 10-325 MG tablet Take 1 tablet by mouth at bedtime. 07/04/21  Yes [provider]  potassium chloride  SA (KLOR-CON  M) 20 MEQ tablet Take 1 tablet (20 mEq total) by mouth daily as  needed (leg swelling). Take with furosemide  01/16/23  Yes [name], MD  promethazine  (PHENERGAN ) 12.5 MG tablet Take 12.5 mg by mouth every 12 (twelve) hours as needed for vomiting or nausea. 05/14/23  Yes [provider]  rosuvastatin  (CRESTOR ) 10 MG tablet Take 1 tablet (10 mg total) by mouth at bedtime. TAKE 1 TABLET(10 MG) BY MOUTH DAILY 06/27/23  Yes [name], MD  tacrolimus (PROTOPIC) 0.1 % ointment Apply 1 Application topically 2 (two) times daily. 02/11/23  Yes [provider]  tirzepatide  (MOUNJARO ) 7.5 MG/0.5ML Pen Inject 7.5 mg into the skin once a week. 12/20/22  Yes [name], MD   tiZANidine  (ZANAFLEX ) 4 MG tablet Take 4 mg by mouth 2 (two) times daily. 07/20/23  Yes [provider]    Allergies: Alitraq, Hydrocodone , Linalool, Methylisothiazolinone, Propylene glycol, Aspirin , Egg-derived products, Hydrocodone -guaifenesin , and Pineapple    Review of Systems  Updated Vital Signs BP (!) 156/81 (BP Location: Right Arm)   Pulse 98   Temp 98.1 F (36.7 C) (Oral)   Resp 13   Ht 5' 4 (1.626 m)   Wt 105.8 kg   SpO2 100%   BMI 40.04 kg/m   Physical Exam Vitals and nursing note reviewed.  Constitutional:      General: She is not in acute distress.    Appearance: She is well-developed.  HENT:     Head: Normocephalic and atraumatic.     Mouth/Throat:     Mouth: Mucous membranes are moist.  Eyes:     General: Vision grossly intact. Gaze aligned appropriately.     Extraocular Movements: Extraocular movements intact.     Conjunctiva/sclera: Conjunctivae normal.  Cardiovascular:     Rate and Rhythm: Normal rate and regular rhythm.     Pulses: Normal pulses.     Heart sounds: Normal heart sounds, S1 normal and S2 normal. No murmur heard.    No friction rub. No gallop.  Pulmonary:     Effort: Pulmonary effort is normal. No respiratory distress.     Breath sounds: Normal breath sounds.  Abdominal:     General: Bowel sounds are normal.     Palpations: Abdomen is soft.     Tenderness: There is no abdominal tenderness. There is no guarding or rebound.     Hernia: No hernia is present.  Musculoskeletal:        General: No swelling.     Cervical back: Full passive range of motion without pain, normal range of motion and neck supple. No spinous process tenderness or muscular tenderness. Normal range of motion.     Right lower leg: No edema.     Left lower leg: No edema.  Skin:    General: Skin is warm and dry.     Capillary Refill: Capillary refill takes less than 2 seconds.     Findings: No ecchymosis, erythema, rash or wound.  Neurological:      General: No focal deficit present.     Mental Status: She is alert and oriented to person, place, and time.     GCS: GCS eye subscore is 4. GCS verbal subscore is 5. GCS motor subscore is 6.     Cranial Nerves: Cranial nerves 2-12 are intact.     Sensory: Sensation is intact.     Motor: Motor function is intact.     Coordination: Coordination is intact.  Psychiatric:        Attention and Perception: Attention normal.  Mood and Affect: Mood normal.        Speech: Speech normal.        Behavior: Behavior normal.     (all labs ordered are listed, but only abnormal results are displayed) Labs Reviewed  CBC WITH DIFFERENTIAL/PLATELET - Abnormal; Notable for the following components:      Result Value   WBC 11.9 (*)    RBC 5.25 (*)    MCH 25.3 (*)    Neutro Abs 9.0 (*)    All other components within normal limits  COMPREHENSIVE METABOLIC PANEL WITH GFR - Abnormal; Notable for the following components:   CO2 21 (*)    Glucose, Bld 104 (*)    All other components within normal limits  CK - Abnormal; Notable for the following components:   Total CK 1,928 (*)    All other components within normal limits  URINALYSIS, ROUTINE W REFLEX MICROSCOPIC - Abnormal; Notable for the following components:   APPearance HAZY (*)    Ketones, ur 20 (*)    Protein, ur 30 (*)    Leukocytes,Ua MODERATE (*)    Bacteria, UA FEW (*)    All other components within normal limits  CBC - Abnormal; Notable for the following components:   MCH 25.1 (*)    All other components within normal limits  COMPREHENSIVE METABOLIC PANEL WITH GFR - Abnormal; Notable for the following components:   Glucose, Bld 124 (*)    Calcium  8.7 (*)    Albumin 3.0 (*)    All other components within normal limits  RESP PANEL BY RT-PCR (RSV, FLU A&B, COVID)  [id number]  URINE CULTURE    EKG: EKG Interpretation Date/Time:  Monday July 22 2023 01:47:27 EDT Ventricular Rate:  84 PR Interval:  158 QRS Duration:  83 QT  Interval:  359 QTC Calculation: 425 R Axis:   21  Text Interpretation: Sinus rhythm Low voltage, precordial leads Probable anteroseptal infarct, old Confirmed by Haze Lonni PARAS [phone]) on 07/22/2023 1:51:42 AM  Radiology: [facility] Pelvis Portable Result Date: 07/22/2023 CLINICAL DATA:  Recent fall with pelvic pain, initial encounter EXAM: PORTABLE PELVIS 1 VIEWS COMPARISON:  None Available. FINDINGS: Pelvic ring is intact. Mild degenerative changes of the hip joints are noted bilaterally. No acute fracture or dislocation is seen. No soft tissue changes are noted. IMPRESSION: Degenerative change without acute abnormality. Electronically Signed   By: Oneil Devonshire M.D.   On: 07/22/2023 01:47   DG Chest 1 View Result Date: 07/21/2023 CLINICAL DATA:  Weakness EXAM: CHEST  1 VIEW COMPARISON:  09/27/2021 FINDINGS: Stable cardiomediastinal silhouette. Low lung volumes. Elevated right hemidiaphragm. The lungs are clear. No pleural effusion or pneumothorax. IMPRESSION: Low lung volumes.  No acute abnormality. Electronically Signed   By: Norman Gatlin M.D.   On: 07/21/2023 20:59     Procedures   Medications Ordered in the ED  heparin  injection 5,000 Units (has no administration in time range)  acetaminophen  (TYLENOL ) tablet 650 mg (has no administration in time range)    Or  acetaminophen  (TYLENOL ) suppository 650 mg (has no administration in time range)  ondansetron  (ZOFRAN ) tablet 4 mg (has no administration in time range)    Or  ondansetron  (ZOFRAN ) injection 4 mg (has no administration in time range)  albuterol  (PROVENTIL ) (2.5 MG/3ML) 0.083% nebulizer solution 2.5 mg (has no administration in time range)  0.9 %  sodium chloride  infusion ( Intravenous New Bag/Given 07/22/23 0441)  cefTRIAXone  (ROCEPHIN ) 1 g in sodium chloride  0.9 % 100  mL IVPB (has no administration in time range)  lactated ringers  bolus 1,000 mL (0 mLs Intravenous Stopped 07/22/23 0436)  cefTRIAXone  (ROCEPHIN ) 1 g in sodium  chloride 0.9 % 100 mL IVPB (1 g Intravenous New Bag/Given 07/22/23 0444)                                    Medical Decision Making Amount and/or Complexity of Data Reviewed External Data Reviewed: labs, radiology, ECG and notes. Labs: ordered. Decision-making details documented in ED Course. Radiology: ordered and independent interpretation performed. Decision-making details documented in ED Course. ECG/medicine tests: ordered and independent interpretation performed. Decision-making details documented in ED Course.  Risk Decision regarding hospitalization.   Presents to the emergency department for evaluation of generalized weakness causing multiple falls.  Normally she has been able to get around on her own without difficulty.  She does live alone.  She had 2 falls today.  No obvious infectious symptoms.  No fever.  Vital signs are unremarkable.  Normal renal function and electrolytes.  She does have an elevated total CPK of 1928.  This is likely from falling and being unable to get up on her own, prolonged downtime.  Will initiate IV fluids.  Will require hospitalization for further management.       Final diagnoses:  Fall, initial encounter  Generalized weakness  Traumatic rhabdomyolysis, initial encounter [facility])    ED Discharge Orders     None          Haze Lonni PARAS, MD 07/22/23 [phone]

## 2023-07-22 NOTE — Progress Notes (Addendum)
 Transition of Care [facility]) - Inpatient Brief Assessment   Patient Details  Name: Stacy Campbell MRN: [id number] Date of Birth: 03-14-1955  Transition of Care [facility]) CM/SW Contact:    [name], RN Phone Number: 07/22/2023, 12:26 PM   Clinical Narrative: CM met with the patient at the bedside to discuss TOC needs - weakness and falls at home.  DME at the home includes CPAP that she wears occasionally.  Patient states that she is normally independent.  Patient was offered Medicare choice regarding home health and patient prefers [facility] - I called and they are unable to accept due to staffing.  Patient's second preference is [facility] - Arna Sayres called - pending decision.  HH orders placed for [facility] Pt/OT to be co-signed by MD.  Medicare choice provided regarding DME and patient did not have a preference.    DME order for RW placed to be co-signed by MD.  I called [employer] to deliver RW to the bedside today.  Patient will likely discharge to home in the next 1-2 days as patient progresses.  Patient has family to provide transportation to home when stable.  07/22/23 1319 - [facility] accepted for [facility] services.  [employer] is delivering RW to bedside soon.  Discharge placed by MD.  No other TOC needs.    Transition of Care Asessment: Insurance and Status: (P) Insurance coverage has been reviewed Patient has primary care physician: (P) Yes Home environment has been reviewed: (P) from home with grandson Prior level of function:: (P) Independent Prior/Current Home Services: (P) No current home services Social Drivers of Health Review: (P) SDOH reviewed needs interventions Readmission risk has been reviewed: (P) Yes Transition of care needs: (P) transition of care needs identified, TOC will continue to follow

## 2023-07-22 NOTE — Progress Notes (Signed)
    Durable Medical Equipment  (From admission, onward)           Start     Ordered   07/22/23 1216  For home use only DME Walker rolling  Once       Question Answer Comment  Walker: With 5 Inch Wheels   Patient needs a walker to treat with the following condition Fall   Patient needs a walker to treat with the following condition Physical deconditioning      07/22/23 1217

## 2023-07-22 NOTE — H&P (Addendum)
 History and Physical    PAMI WOOL FMW:[id number] DOB: 11/02/1955 DOA: 07/21/2023  PCP: [name], MD  Patient coming from: home  I have personally briefly reviewed patient's old medical records in [facility]  Chief Complaint: recurrent  mechanical falls  HPI: Stacy Campbell is a 68 y.o. female with medical history significant of DMII, liver cirrhosis, HLD, Asthma./COPD, HTN , MDE, MGUS, P Afib,  secondary hyercagulable state , OSA who presents to ED with weakness and recurrent mechanical fall at home.  Patient  note no  head strike no LOC, no presyncope ,no chest pain, sob/ n/v/dysuria or urinary frequency. Currently she notes pain diffuse  due to injury from fall. She however noted left ankle and foot very painful.    ED Course: In ed on evaluation patient found to have mild rhabdomyolysis  and abnormal UA. Patient is admitted for observation and further treatment.  Vitals: afeb, bp 125/70, hr 98, rr 14, sat 95%  RVP neg Wbc 11.9, hgb 13.3, plt 273,  Na 140, K 3.5, CL 105, glu 104, cr 1 CK 1928 Cxr: NAD UA: +ketones, + bacteria , mod LE   Tx LR, CTX  Review of Systems: As per HPI otherwise 10 point review of systems negative.   Past Medical History:  Diagnosis Date   Angio-edema    Anxiety    Arthritis    lower back (01/29/2017)   Asthma    Bone spur    spine   Chronic lower back pain    Cirrhosis of liver (HCC) 12/2018   Depression    Diabetes mellitus without complication (HCC)    Eczema    GERD (gastroesophageal reflux disease)    Hepatitis C    was treated for this   Hyperplastic polyp of intestine    Hypertension    Insomnia    OSA (obstructive sleep apnea)    Pending formal sleep study   Osteoarthritis    Osteoporosis    Paroxysmal atrial fibrillation (HCC) 09/27/2021   cardioversion in 1990s; Noted to be in Afib-RVR when being evaluated by sleep medicine.   Splenic artery aneurysm (HCC)    Urticaria     Past Surgical  History:  Procedure Laterality Date   CARDIOVERSION  1990s   CARDIOVERSION N/A 10/18/2021   Procedure: CARDIOVERSION;  Surgeon: Jeffrie Oneil BROCKS, MD;  Location: [facility];  Service: Cardiovascular;  Laterality: N/A;   CHOLECYSTECTOMY N/A 01/22/2018   Procedure: LAPAROSCOPIC CHOLECYSTECTOMY ERAS PATHWAY;  Surgeon: Signe Mitzie LABOR, MD;  Location: [facility] ORS;  Service: General;  Laterality: N/A;   CYSTOSCOPY W/ URETERAL STENT PLACEMENT Left 01/29/2017   Procedure: CYSTOSCOPY WITH RETROGRADE PYELOGRAM/URETERAL STENT PLACEMENT;  Surgeon: Cam Morene ORN, MD;  Location: [facility] OR;  Service: Urology;  Laterality: Left;   CYSTOSCOPY WITH RETROGRADE PYELOGRAM, URETEROSCOPY AND STENT PLACEMENT Left 01/29/2017   CYSTOSCOPY WITH RETROGRADE PYELOGRAM/URETERAL STENT PLACEMENT   NM MYOVIEW  LTD  10/28/2017   Nuclear stress EF: 63%. The left ventricular ejection fraction is normal (55-65%). There was no ST segment deviation noted during stress. The study is normal.  This is a low risk study.   NM MYOVIEW  LTD  11/06/2021   ?  Myocardial infarction located in the anterior region? ->  Despite this, there is a EF of 71% with no RWMA.  READ AS LOW RISK/LOW RISK; in fact, on review of images the anterior defect appeared to be consistent with breast attenuation.  No evidence of ischemia or infarction.  Preserved EF  71%.   TRANSTHORACIC ECHOCARDIOGRAM  01/30/2017   Normal LV size and function-hyperdynamic/vigorous.  EF 65 to 70%. No RWMA.  Focal basal hypertrophy - Minimal LVOT gradient (2.6 m/s).  GRII DD.  Calcified AoV with no stenosis.  Mild MAC.  Trivial TR.  Peak PAP 55 mmHg.     reports that she quit smoking about 15 years ago. Her smoking use included cigarettes. She started smoking about 52 years ago. She has a 9.3 pack-year smoking history. She has never used smokeless tobacco. She reports that she does not drink alcohol  and does not use drugs.  Allergies  Allergen Reactions   Alitraq Nausea And Vomiting    Hydrocodone  Itching   Linalool    Methylisothiazolinone Other (See Comments)    Other reaction(s): Other (See Comments) Positive patch test Positive patch test    Propylene Glycol    Aspirin  Itching and Other (See Comments)    Lower dose doesn't make her itch (she takes it every day)   Egg-Derived Products Nausea Only and Other (See Comments)    No reaction if in another food   Hydrocodone -Guaifenesin  Rash   Pineapple Itching and Nausea Only    Family History  Problem Relation Age of Onset   Asthma Mother    Diabetes Mother    Asthma Father    Diabetes Father    Cancer Father        prostate   Eczema Father    Alcoholism Brother    Asthma Brother    Diabetes Brother    Asthma Brother    Diabetes Brother    Asthma Brother    Diabetes Brother    Asthma Brother    Diabetes Brother    Hepatitis B Daughter    Breast cancer Neg Hx    Sleep apnea Neg Hx     Prior to Admission medications   Medication Sig Start Date End Date Taking? Authorizing Provider  albuterol  (VENTOLIN  HFA) 108 (90 Base) MCG/ACT inhaler Inhale 2 puffs into the lungs every 6 (six) hours as needed for wheezing or shortness of breath. 04/05/23  Yes [name], MD  Aspirin -Salicylamide-Caffeine (ARTHRITIS STRENGTH BC POWDER PO) Take 1 packet by mouth daily as needed.   Yes [provider]  busPIRone  (BUSPAR ) 15 MG tablet Take 15 mg by mouth 3 (three) times daily. 05/27/23  Yes [provider]  carvedilol  (COREG ) 25 MG tablet TAKE 1 TABLET(25 MG) BY MOUTH TWICE DAILY WITH A MEAL 12/15/21  Yes [name], MD  cetirizine  (ZYRTEC ) 10 MG tablet Take 10 mg by mouth daily. 09/28/21  Yes [provider]  clobetasol  ointment (TEMOVATE ) 0.05 % Apply 1 Application topically 2 (two) times daily as needed.   Yes [provider]  desvenlafaxine (PRISTIQ) 100 MG 24 hr tablet Take 100 mg by mouth daily. 07/20/23  Yes [provider]  diltiazem  (CARDIZEM  CD) 360 MG 24 hr  capsule Take 1 capsule (360 mg total) by mouth daily. 05/14/23  Yes Anner Alm ORN, MD  doxepin  (SINEQUAN ) 25 MG capsule Take 50 mg by mouth 2 (two) times daily. 02/11/23  Yes [provider]  ELIQUIS  5 MG TABS tablet TAKE 1 TABLET(5 MG) BY MOUTH TWICE DAILY 07/11/23  Yes Anner Alm ORN, MD  furosemide  (LASIX ) 20 MG tablet Take 1 tablet (20 mg total) by mouth daily as needed for edema. Take with potassium 01/16/23  Yes [name], MD  ibuprofen  (ADVIL ) 200 MG tablet Take 200 mg by mouth every  6 (six) hours as needed for mild pain (pain score 1-3).   Yes [provider]  meloxicam  (MOBIC ) 15 MG tablet Take 15 mg by mouth daily.   Yes [provider]  metFORMIN  (GLUCOPHAGE -XR) 500 MG 24 hr tablet Take 1 tablet (500 mg total) by mouth daily with breakfast. 07/01/23  Yes [name], MD  montelukast  (SINGULAIR ) 10 MG tablet Take 10 mg by mouth daily.   Yes [provider]  mupirocin  ointment (BACTROBAN ) 2 % Use twice a day for 7 days as needed for skin infections. 06/27/23  Yes [name], MD  mycophenolate  (CELLCEPT ) 500 MG tablet Take 500 mg by mouth 2 (two) times daily. 02/11/23  Yes [provider]  nystatin  ointment (MYCOSTATIN ) Apply 1 Application topically 2 (two) times daily. 02/11/23 02/11/24 Yes [provider]  oxyCODONE -acetaminophen  (PERCOCET ) 10-325 MG tablet Take 1 tablet by mouth at bedtime. 07/04/21  Yes [provider]  potassium chloride  SA (KLOR-CON  M) 20 MEQ tablet Take 1 tablet (20 mEq total) by mouth daily as needed (leg swelling). Take with furosemide  01/16/23  Yes [name], MD  promethazine  (PHENERGAN ) 12.5 MG tablet Take 12.5 mg by mouth every 12 (twelve) hours as needed for vomiting or nausea. 05/14/23  Yes [provider]  rosuvastatin  (CRESTOR ) 10 MG tablet Take 1 tablet (10 mg total) by mouth at bedtime. TAKE 1 TABLET(10 MG) BY MOUTH DAILY 06/27/23  Yes [name], MD  tacrolimus (PROTOPIC) 0.1 %  ointment Apply 1 Application topically 2 (two) times daily. 02/11/23  Yes [provider]  tirzepatide  (MOUNJARO ) 7.5 MG/0.5ML Pen Inject 7.5 mg into the skin once a week. 12/20/22  Yes [name], MD  tiZANidine  (ZANAFLEX ) 4 MG tablet Take 4 mg by mouth 2 (two) times daily. 07/20/23  Yes [provider]    Physical Exam: Vitals:   07/21/23 2001 07/21/23 2021 07/21/23 2339 07/22/23 0318  BP: 125/70  (!) 145/76 (!) 156/81  Pulse: 98  98 98  Resp: 14  16 13   Temp: 98.6 F (37 C)  98.6 F (37 C) 98.1 F (36.7 C)  TempSrc: Oral   Oral  SpO2: 95%  97% 100%  Weight:  105.8 kg    Height:  5' 4 (1.626 m)      Constitutional: NAD, calm, comfortable Vitals:   07/21/23 2001 07/21/23 2021 07/21/23 2339 07/22/23 0318  BP: 125/70  (!) 145/76 (!) 156/81  Pulse: 98  98 98  Resp: 14  16 13   Temp: 98.6 F (37 C)  98.6 F (37 C) 98.1 F (36.7 C)  TempSrc: Oral   Oral  SpO2: 95%  97% 100%  Weight:  105.8 kg    Height:  5' 4 (1.626 m)     Eyes: PERRL, lids and conjunctivae normal ENMT: Mucous membranes are moist. Posterior pharynx clear of any exudate or lesions.Normal dentition.  Neck: normal, supple, no masses, no thyromegaly Respiratory: clear to auscultation bilaterally, no wheezing, no crackles. Normal respiratory effort. No accessory muscle use.  Cardiovascular: Regular rate and rhythm, no murmurs / rubs / gallops. No extremity edema. 2+ pedal pulses Abdomen: no tenderness, no masses palpated. No hepatosplenomegaly. Bowel sounds positive.  Musculoskeletal: no clubbing / cyanosis. No joint deformity upper and lower extremities. Good ROM, no contractures. Normal muscle tone. Left foot and ankle pain with decrease rom due to pain, tender to touch , increase warmth but no signficant swelling or erythema. Skin: no rashes, lesions, ulcers. No induration  Neurologic: CN 2-12 grossly intact. Sensation intact,. Strength  MAE Psychiatric: Normal judgment and insight. Alert  and oriented x 3. Normal mood.    Labs on Admission: I have personally reviewed following labs and imaging studies  CBC: Recent Labs  Lab 07/21/23 2012 07/22/23 0416  WBC 11.9* 9.2  NEUTROABS 9.0*  --   HGB 13.3 12.3  HCT 42.2 39.7  MCV 80.4 80.9  PLT 273 214   Basic Metabolic Panel: Recent Labs  Lab 07/21/23 2012 07/22/23 0416  NA 140 138  K 3.5 3.7  CL 105 104  CO2 21* 22  GLUCOSE 104* 124*  BUN 11 9  CREATININE 1.00 0.89  CALCIUM  9.3 8.7*   GFR: Estimated Creatinine Clearance: 71.7 mL/min (by C-G formula based on SCr of 0.89 mg/dL). Liver Function Tests: Recent Labs  Lab 07/21/23 2012 07/22/23 0416  AST 38 30  ALT 22 20  ALKPHOS 85 69  BILITOT 0.7 0.8  PROT 7.7 6.7  ALBUMIN 3.5 3.0*   No results for input(s): LIPASE, AMYLASE in the last 168 hours. No results for input(s): AMMONIA in the last 168 hours. Coagulation Profile: No results for input(s): INR, PROTIME in the last 168 hours. Cardiac Enzymes: Recent Labs  Lab 07/21/23 2012  CKTOTAL 1,928*   BNP (last 3 results) No results for input(s): PROBNP in the last 8760 hours. HbA1C: No results for input(s): HGBA1C in the last 72 hours. CBG: No results for input(s): GLUCAP in the last 168 hours. Lipid Profile: No results for input(s): CHOL, HDL, LDLCALC, TRIG, CHOLHDL, LDLDIRECT in the last 72 hours. Thyroid  Function Tests: No results for input(s): TSH, T4TOTAL, FREET4, T3FREE, THYROIDAB in the last 72 hours. Anemia Panel: No results for input(s): VITAMINB12, FOLATE, FERRITIN, TIBC, IRON, RETICCTPCT in the last 72 hours. Urine analysis:    Component Value Date/Time   COLORURINE YELLOW 07/22/2023 0247   APPEARANCEUR HAZY (A) 07/22/2023 0247   LABSPEC 1.023 07/22/2023 0247   PHURINE 5.0 07/22/2023 0247   GLUCOSEU NEGATIVE 07/22/2023 0247   HGBUR NEGATIVE 07/22/2023 0247   BILIRUBINUR NEGATIVE 07/22/2023 0247   BILIRUBINUR negative 08/20/2017  0845   BILIRUBINUR NEG 11/07/2011 1525   KETONESUR 20 (A) 07/22/2023 0247   PROTEINUR 30 (A) 07/22/2023 0247   UROBILINOGEN 0.2 08/20/2017 0845   NITRITE NEGATIVE 07/22/2023 0247   LEUKOCYTESUR MODERATE (A) 07/22/2023 0247    Radiological Exams on Admission: DG Pelvis Portable Result Date: 07/22/2023 CLINICAL DATA:  Recent fall with pelvic pain, initial encounter EXAM: PORTABLE PELVIS 1 VIEWS COMPARISON:  None Available. FINDINGS: Pelvic ring is intact. Mild degenerative changes of the hip joints are noted bilaterally. No acute fracture or dislocation is seen. No soft tissue changes are noted. IMPRESSION: Degenerative change without acute abnormality. Electronically Signed   By: Oneil Devonshire M.D.   On: 07/22/2023 01:47   DG Chest 1 View Result Date: 07/21/2023 CLINICAL DATA:  Weakness EXAM: CHEST  1 VIEW COMPARISON:  09/27/2021 FINDINGS: Stable cardiomediastinal silhouette. Low lung volumes. Elevated right hemidiaphragm. The lungs are clear. No pleural effusion or pneumothorax. IMPRESSION: Low lung volumes.  No acute abnormality. Electronically Signed   By: Norman Gatlin M.D.   On: 07/21/2023 20:59    EKG: Independently reviewed.   Assessment/Plan    Mild traumatic Rhabdomyolysis  -in setting of recurrent mechanical falls  - admit to tele  -continue with ivfs   UTI -continue with CTX -f/u on urine culture   Debility  Fall -increase falls at home , acute on chronic  debility in setting acute medical illness -on AC to be complete f/u with [facility] -PT/OT to see   Left foot ankle pain -supportive care  - xray pending   DMII -iss/fs    Liver cirrhosis -no active issues    HLD -diet controlled    Asthma./COPD -no acute flare  -resume home regimen    HTN  -stable    Depression  - followed by psych as out patient   MGUS Secondary hyercagulable state -no active issues  -on Eliquis    P Afib -currently sinus  -continue Eliquis     OSA  -place on cpap   DVT  prophylaxis: eliquis  Code Status: full/ as discussed per patient wishes in event of cardiac arrest  Family Communication:none at bedside Disposition Plan:patient  expected to be admitted greater than 2 midnights  Consults called: n./a Admission status: med tele   Camila DELENA Ned MD [facility]   If 7PM-7AM, please contact night-coverage [email or web address] Password [email or web address]  07/22/2023, 7:10 AM

## 2023-07-22 NOTE — Progress Notes (Signed)
 Physical Therapy Quick Note  PT has completed initial evaluation.    Overall, patient at supervision-contact guard assistance level.   PT Follow up recommended: Home Health PT Equipment recommended:  Rolling walker Complete evaluation note to follow.     Leontine Hilt, [place] [facility] [phone]

## 2023-07-22 NOTE — Care Management Obs Status (Cosign Needed)
 MEDICARE OBSERVATION STATUS NOTIFICATION   Patient Details  Name: Stacy Campbell MRN: [id number] Date of Birth: September 17, 1955   Medicare Observation Status Notification Given:  Yes    [name], RN 07/22/2023, 2:42 PM

## 2023-07-22 NOTE — Evaluation (Signed)
 Occupational Therapy Evaluation/Discharge Patient Details Name: Stacy Campbell MRN: [id number] DOB: May 29, 1955 Today's Date: 07/22/2023   History of Present Illness   Pt is a 68 y.o. F presenting to [facility] on 07/21/23 w/ weakness and recurrent mechanical falls at home. PMH is significant for DMII, liver cirrhosis, HLD, asthma/COPD, HTN, MDE, MGUS, and A-fib.     Clinical Impressions Patient evaluated by Occupational Therapy with no further acute OT needs identified. All education has been completed and the patient has no further questions. Pt education provided on proper RW height adjustment. Pt verbalized that she uses a shower chair at home and a reacher to assist with LB dressing. Pt verbalized increased difficulty with managing socks at baseline. Pt education provided on use of sock aid and how to purchase herself. Pt reports that at baseline, she is independent with BADL tasks. Recommended follow up Occupational Therapy services at discharge to focus on pt's ability to return to prior level of function. OT is signing off. Thank you for this referral.       Functional Status Assessment   Patient has had a recent decline in their functional status and demonstrates the ability to make significant improvements in function in a reasonable and predictable amount of time.     Equipment Recommendations   None recommended by OT      Precautions/Restrictions   Precautions Precautions: Fall Recall of Precautions/Restrictions: Intact Restrictions Weight Bearing Restrictions Per Provider Order: No     Mobility Bed Mobility    General bed mobility comments: Pt sitting on EOB upon therapy arrival. Increased difficulty scooting hips towards EOB in order to prepare for standing. Required assist in which bed pad was used. Pt able to slide hips back into middle of bed after completing stand to sit transition.    Transfers Overall transfer level: Needs assistance Equipment used:  Rolling walker (2 wheels) Transfers: Sit to/from Stand Sit to Stand: Min assist, From elevated surface    General transfer comment: VC provided for hand placement prior to sit to stand transition. Pt required some physical assistance to power up into standing from bed. Once standing, OT provided education on proper RW height adjustment and completed while pt was standing. Pt verbalized understanding.      Balance Overall balance assessment: No apparent balance deficits (not formally assessed)       ADL either performed or assessed with clinical judgement   ADL Overall ADL's : Needs assistance/impaired  Lower Body Bathing: Minimal assistance;Sit to/from stand  Lower Body Dressing: Maximal assistance;Sit to/from stand Lower Body Dressing Details (indicate cue type and reason): Unable to reach socks to doff/don. Pt reports that she uses a reacher at home to remove her socks. If she is unable to put them on, she just leaves them off.            Vision Baseline Vision/History: 1 Wears glasses Ability to See in Adequate Light: 0 Adequate Patient Visual Report: No change from baseline Vision Assessment?: No apparent visual deficits     Perception Perception: Within Functional Limits       Praxis Praxis: WFL       Pertinent Vitals/Pain Pain Assessment Pain Assessment: 0-10 Pain Score: 10-Worst pain ever Pain Location: L ankle Pain Descriptors / Indicators: Discomfort, Grimacing, Aching, Constant Pain Intervention(s): Monitored during session, Patient requesting pain meds-RN notified     Extremity/Trunk Assessment Upper Extremity Assessment Upper Extremity Assessment: Generalized weakness;Right hand dominant   Lower Extremity Assessment Lower Extremity Assessment: Defer to PT  evaluation   Cervical / Trunk Assessment Cervical / Trunk Assessment: Other exceptions Cervical / Trunk Exceptions: body habitus   Communication Communication Communication: No apparent  difficulties   Cognition Arousal: Alert Behavior During Therapy: WFL for tasks assessed/performed Cognition: No apparent impairments     Following commands: Intact       Cueing  General Comments   Cueing Techniques: Verbal cues  VSS on RA           Home Living Family/patient expects to be discharged to:: Private residence Living Arrangements: Other relatives;Children (daughter, brother, and grand son) Available Help at Discharge: Family;Available 24 hours/day (grand son) Type of Home: House Home Access: Stairs to enter [employer] of Steps: 5 Entrance Stairs-Rails: Right;Left Home Layout: One level     Bathroom Shower/Tub: Producer, television/film/video: Standard     Home Equipment: Pharmacist, hospital (2 wheels);Adaptive equipment Adaptive Equipment: Reacher Additional Comments: RW was delivered during this hospital stay      Prior Functioning/Environment Prior Level of Function : Independent/Modified Independent;History of Falls (last six months)  Mobility Comments: independent without use of AD ADLs Comments: independent    OT Problem List: Decreased strength;Impaired balance (sitting and/or standing)        OT Goals(Current goals can be found in the care plan section)   Acute Rehab OT Goals Patient Stated Goal: none stated OT Goal Formulation: All assessment and education complete, DC therapy   OT Frequency:   1X visit       AM-PAC OT 6 Clicks Daily Activity     Outcome Measure Help from another person eating meals?: None Help from another person taking care of personal grooming?: None Help from another person toileting, which includes using toliet, bedpan, or urinal?: A Little Help from another person bathing (including washing, rinsing, drying)?: A Little Help from another person to put on and taking off regular upper body clothing?: A Little Help from another person to put on and taking off regular lower body clothing?:  A Lot 6 Click Score: 19   End of Session Equipment Utilized During Treatment: Rolling walker (2 wheels) Nurse Communication: Patient requests pain meds  Activity Tolerance: Patient tolerated treatment well Patient left: in bed;with call bell/phone within reach;with bed alarm set;with family/visitor present  OT Visit Diagnosis: Muscle weakness (generalized) (M62.81);Unsteadiness on feet (R26.81)                Time: 8595-8579 OT Time Calculation (min): 16 min Charges:  OT General Charges $OT Visit: 1 Visit OT Evaluation $OT Eval Low Complexity: 1 Low  Stacy Campbell, OTR/L,CBIS  Supplemental OT - [facility] Secure Chat Preferred    Stacy Campbell, Stacy BIRCH 07/22/2023, 2:58 PM

## 2023-07-22 NOTE — Plan of Care (Signed)

## 2023-07-22 NOTE — Progress Notes (Signed)
 OT Cancellation Note  Patient Details Name: Stacy Campbell MRN: [id number] DOB: May 07, 1955   Cancelled Treatment:    Reason Eval/Treat Not Completed: Patient not medically ready Pending L ankle X-ray. Plan to follow up when X-ray completed and results available.   Leita Howell, OTR/L,CBIS  Supplemental OT - [facility] Secure Chat Preferred   07/22/2023, 8:26 AM

## 2023-07-22 NOTE — Progress Notes (Signed)
 PT Cancellation Note  Patient Details Name: Stacy Campbell MRN: [id number] DOB: 1955-03-17   Cancelled Treatment:    Reason Eval/Treat Not Completed: Other (comment) (Pending L ankle X-ray. Plan to follow up when able.)  Leontine Hilt, SPT Acute Rehab [phone]   Leontine Hilt 07/22/2023, 8:00 AM

## 2023-07-22 NOTE — Discharge Summary (Signed)
 Physician Discharge Summary  Stacy Campbell FMW:[id number] DOB: 10/23/1955 DOA: 07/21/2023  PCP: [name], MD  Admit date: 07/21/2023 Discharge date: 07/22/2023  Admitted From: Home Disposition: Home  Recommendations for Outpatient Follow-up:  Follow up with PCP in 1-2 weeks  Home Health: PT/OT Equipment/Devices: Rolling walker  Discharge Condition: Stable CODE STATUS: Full Diet recommendation: Low-salt low-fat diet  Brief/Interim Summary: Stacy Campbell is a 68 y.o. female with medical history significant of DMII, liver cirrhosis, HLD, Asthma./COPD, HTN , MDE, MGUS, P Afib,  secondary hyercagulable state , OSA who presents to ED with weakness and recurrent mechanical falls at home.  Patient admitted as above with multiple falls at home, patient confirms mechanical falls, tripped on items in the home including children's toys.  No overt visual or neurological deficits noted, given patient's stable nature would recommend outpatient follow-up with ongoing home health PT and OT.  Mildly elevated CK likely traumatic secondary to falls but without overt rhabdo or AKI.  Recommend increased p.o. free water intake but otherwise stable for discharge home, follow-up with PCP in the next 1 to 2 weeks as scheduled.  Discharge Diagnoses:  Principal Problem:   Rhabdomyolysis    Discharge Instructions  Discharge Instructions     Call MD for:  difficulty breathing, headache or visual disturbances   Complete by: As directed    Call MD for:  extreme fatigue   Complete by: As directed    Call MD for:  hives   Complete by: As directed    Call MD for:  persistant dizziness or light-headedness   Complete by: As directed    Call MD for:  persistant nausea and vomiting   Complete by: As directed    Call MD for:  severe uncontrolled pain   Complete by: As directed    Call MD for:  temperature >100.4   Complete by: As directed    Diet - low sodium heart healthy   Complete by:  As directed    Increase activity slowly   Complete by: As directed       Allergies as of 07/22/2023       Reactions   Alitraq Nausea And Vomiting   Hydrocodone  Itching   Linalool    Methylisothiazolinone Other (See Comments)   Other reaction(s): Other (See Comments) Positive patch test Positive patch test   Propylene Glycol    Aspirin  Itching, Other (See Comments)   Lower dose doesn't make her itch (she takes it every day)   Egg-derived Products Nausea Only, Other (See Comments)   No reaction if in another food   Hydrocodone -guaifenesin  Rash   Pineapple Itching, Nausea Only        Medication List     TAKE these medications    albuterol  108 (90 Base) MCG/ACT inhaler Commonly known as: VENTOLIN  HFA Inhale 2 puffs into the lungs every 6 (six) hours as needed for wheezing or shortness of breath.   ARTHRITIS STRENGTH BC POWDER PO Take 1 packet by mouth daily as needed.   busPIRone  15 MG tablet Commonly known as: BUSPAR  Take 15 mg by mouth 3 (three) times daily.   carvedilol  25 MG tablet Commonly known as: COREG  TAKE 1 TABLET(25 MG) BY MOUTH TWICE DAILY WITH A MEAL   cetirizine  10 MG tablet Commonly known as: ZYRTEC  Take 10 mg by mouth daily.   clobetasol  ointment 0.05 % Commonly known as: TEMOVATE  Apply 1 Application topically 2 (two) times daily as needed.   desvenlafaxine 100 MG 24  hr tablet Commonly known as: PRISTIQ Take 100 mg by mouth daily.   diltiazem  360 MG 24 hr capsule Commonly known as: Cardizem  CD Take 1 capsule (360 mg total) by mouth daily.   doxepin  25 MG capsule Commonly known as: SINEQUAN  Take 50 mg by mouth 2 (two) times daily.   Eliquis  5 MG Tabs tablet Generic drug: apixaban  TAKE 1 TABLET(5 MG) BY MOUTH TWICE DAILY   furosemide  20 MG tablet Commonly known as: LASIX  Take 1 tablet (20 mg total) by mouth daily as needed for edema. Take with potassium   ibuprofen  200 MG tablet Commonly known as: ADVIL  Take 200 mg by mouth every  6 (six) hours as needed for mild pain (pain score 1-3).   meloxicam  15 MG tablet Commonly known as: MOBIC  Take 15 mg by mouth daily.   metFORMIN  500 MG 24 hr tablet Commonly known as: GLUCOPHAGE -XR Take 1 tablet (500 mg total) by mouth daily with breakfast.   montelukast  10 MG tablet Commonly known as: SINGULAIR  Take 10 mg by mouth daily.   mupirocin  ointment 2 % Commonly known as: BACTROBAN  Use twice a day for 7 days as needed for skin infections.   mycophenolate  500 MG tablet Commonly known as: CELLCEPT  Take 500 mg by mouth 2 (two) times daily.   nystatin  ointment Commonly known as: MYCOSTATIN  Apply 1 Application topically 2 (two) times daily.   oxyCODONE -acetaminophen  10-325 MG tablet Commonly known as: PERCOCET  Take 1 tablet by mouth at bedtime.   potassium chloride  SA 20 MEQ tablet Commonly known as: KLOR-CON  M Take 1 tablet (20 mEq total) by mouth daily as needed (leg swelling). Take with furosemide    promethazine  12.5 MG tablet Commonly known as: PHENERGAN  Take 12.5 mg by mouth every 12 (twelve) hours as needed for vomiting or nausea.   rosuvastatin  10 MG tablet Commonly known as: CRESTOR  Take 1 tablet (10 mg total) by mouth at bedtime. TAKE 1 TABLET(10 MG) BY MOUTH DAILY   tacrolimus 0.1 % ointment Commonly known as: PROTOPIC Apply 1 Application topically 2 (two) times daily.   tirzepatide  7.5 MG/0.5ML Pen Commonly known as: MOUNJARO  Inject 7.5 mg into the skin once a week.   tiZANidine  4 MG tablet Commonly known as: ZANAFLEX  Take 4 mg by mouth 2 (two) times daily.               Durable Medical Equipment  (From admission, onward)           Start     Ordered   07/22/23 1216  For home use only DME Walker rolling  Once       Question Answer Comment  Walker: With 5 Inch Wheels   Patient needs a walker to treat with the following condition Fall   Patient needs a walker to treat with the following condition Physical deconditioning       07/22/23 1217            Follow-up Information     [facility] Follow up.   Why: [facility] will provide a RW to your hospital room before you are discharged home. Contact information: [address] [place] [address] [phone]         [facility] Follow up.   Specialty: Home Health Services Contact information: [address] [place] [address] [place] [address] [phone]                Allergies  Allergen Reactions   Alitraq Nausea And Vomiting  Hydrocodone  Itching   Linalool    Methylisothiazolinone Other (See Comments)    Other reaction(s): Other (See Comments) Positive patch test Positive patch test    Propylene Glycol    Aspirin  Itching and Other (See Comments)    Lower dose doesn't make her itch (she takes it every day)   Egg-Derived Products Nausea Only and Other (See Comments)    No reaction if in another food   Hydrocodone -Guaifenesin  Rash   Pineapple Itching and Nausea Only    Consultations: None  Procedures/Studies: CT HEAD WO CONTRAST ( ) Result Date: 07/22/2023 CLINICAL DATA:  Weakness and recurrent falls EXAM: CT HEAD WITHOUT CONTRAST TECHNIQUE: Contiguous axial images were obtained from the base of the skull through the vertex without intravenous contrast. RADIATION DOSE REDUCTION: This exam was performed according to the departmental dose-optimization program which includes automated exposure control, adjustment of the mA and/or kV according to patient size and/or use of iterative reconstruction technique. COMPARISON:  September 12, 2022 FINDINGS: CT HEAD: There is no hemorrhage. No acute ischemic changes. No mass lesion. The ventricles are normal. Skull/sinuses/orbits: No significant abnormality. IMPRESSION: Normal Electronically Signed   By: Nancyann Burns M.D.   On: 07/22/2023 11:58   DG Ankle 2 Views Left Result Date: 07/22/2023 CLINICAL DATA:  391012 Intractable pain 391012. EXAM: LEFT ANKLE - 2  VIEW; LEFT FOOT - 2 VIEW COMPARISON:  None Available. FINDINGS: There is diffuse osteopenia of the visualized osseous structures. No acute fracture or dislocation. No aggressive osseous lesion. Note is made of lobulated and fragmented calcaneocuboid accessory ossicle-os peroneum. Ankle mortise appears intact. Mild-to-moderate diffuse degenerative changes of imaged joints. Calcaneal spur noted along the Achilles tendon and Plantar aponeurosis attachment sites. No focal soft tissue swelling. No radiopaque foreign bodies. IMPRESSION: No acute osseous abnormality of the left ankle or foot. Electronically Signed   By: Ree Molt M.D.   On: 07/22/2023 08:54   DG Foot 2 Views Left Result Date: 07/22/2023 CLINICAL DATA:  391012 Intractable pain 391012. EXAM: LEFT ANKLE - 2 VIEW; LEFT FOOT - 2 VIEW COMPARISON:  None Available. FINDINGS: There is diffuse osteopenia of the visualized osseous structures. No acute fracture or dislocation. No aggressive osseous lesion. Note is made of lobulated and fragmented calcaneocuboid accessory ossicle-os peroneum. Ankle mortise appears intact. Mild-to-moderate diffuse degenerative changes of imaged joints. Calcaneal spur noted along the Achilles tendon and Plantar aponeurosis attachment sites. No focal soft tissue swelling. No radiopaque foreign bodies. IMPRESSION: No acute osseous abnormality of the left ankle or foot. Electronically Signed   By: Ree Molt M.D.   On: 07/22/2023 08:54   DG Pelvis Portable Result Date: 07/22/2023 CLINICAL DATA:  Recent fall with pelvic pain, initial encounter EXAM: PORTABLE PELVIS 1 VIEWS COMPARISON:  None Available. FINDINGS: Pelvic ring is intact. Mild degenerative changes of the hip joints are noted bilaterally. No acute fracture or dislocation is seen. No soft tissue changes are noted. IMPRESSION: Degenerative change without acute abnormality. Electronically Signed   By: Oneil Devonshire M.D.   On: 07/22/2023 01:47   DG Chest 1 View Result  Date: 07/21/2023 CLINICAL DATA:  Weakness EXAM: CHEST  1 VIEW COMPARISON:  09/27/2021 FINDINGS: Stable cardiomediastinal silhouette. Low lung volumes. Elevated right hemidiaphragm. The lungs are clear. No pleural effusion or pneumothorax. IMPRESSION: Low lung volumes.  No acute abnormality. Electronically Signed   By: Norman Gatlin M.D.   On: 07/21/2023 20:59     Subjective: No acute issues or events overnight   Discharge Exam: Vitals:  07/22/23 0318 07/22/23 0825  BP: (!) 156/81 138/80  Pulse: 98 99  Resp: 13 19  Temp: 98.1 F (36.7 C) 98.1 F (36.7 C)  SpO2: 100% 95%   Vitals:   07/21/23 2021 07/21/23 2339 07/22/23 0318 07/22/23 0825  BP:  (!) 145/76 (!) 156/81 138/80  Pulse:  98 98 99  Resp:  16 13 19   Temp:  98.6 F (37 C) 98.1 F (36.7 C) 98.1 F (36.7 C)  TempSrc:   Oral   SpO2:  97% 100% 95%  Weight: 105.8 kg     Height: 5' 4 (1.626 m)       General: Pt is alert, awake, not in acute distress Cardiovascular: RRR, S1/S2 +, no rubs, no gallops Respiratory: CTA bilaterally, no wheezing, no rhonchi Abdominal: Soft, NT, ND, bowel sounds + Extremities: no edema, no cyanosis    The results of significant diagnostics from this hospitalization (including imaging, microbiology, ancillary and laboratory) are listed below for reference.     Microbiology: Recent Results (from the past 240 hours)  Resp panel by RT-PCR (RSV, Flu A&B, Covid) Anterior Nasal Swab     Status: None   Collection Time: 07/21/23  8:04 PM   Specimen: Anterior Nasal Swab  Result Value Ref Range Status   SARS Coronavirus 2 by RT PCR NEGATIVE NEGATIVE Final   Influenza A by PCR NEGATIVE NEGATIVE Final   Influenza B by PCR NEGATIVE NEGATIVE Final    Comment: (NOTE) The Xpert Xpress SARS-CoV-2/FLU/RSV plus assay is intended as an aid in the diagnosis of influenza from Nasopharyngeal swab specimens and should not be used as a sole basis for treatment. Nasal washings and aspirates are unacceptable  for Xpert Xpress SARS-CoV-2/FLU/RSV testing.  Fact Sheet for Patients: BloggerCourse.com  Fact Sheet for Healthcare Providers: SeriousBroker.it  This test is not yet approved or cleared by the [place]  FDA and has been authorized for detection and/or diagnosis of SARS-CoV-2 by FDA under an Emergency Use Authorization (EUA). This EUA will remain in effect (meaning this test can be used) for the duration of the COVID-19 declaration under Section 564(b)(1) of the Act, 21 U.S.C. section 360bbb-3(b)(1), unless the authorization is terminated or revoked.     Resp Syncytial Virus by PCR NEGATIVE NEGATIVE Final    Comment: (NOTE) Fact Sheet for Patients: BloggerCourse.com  Fact Sheet for Healthcare Providers: SeriousBroker.it  This test is not yet approved or cleared by the [place]  FDA and has been authorized for detection and/or diagnosis of SARS-CoV-2 by FDA under an Emergency Use Authorization (EUA). This EUA will remain in effect (meaning this test can be used) for the duration of the COVID-19 declaration under Section 564(b)(1) of the Act, 21 U.S.C. section 360bbb-3(b)(1), unless the authorization is terminated or revoked.  Performed at [facility], [address] [place], [place] [address]      Labs: BNP (last 3 results) No results for input(s): BNP in the last 8760 hours. Basic Metabolic Panel: Recent Labs  Lab 07/21/23 2012 07/22/23 0416  NA 140 138  K 3.5 3.7  CL 105 104  CO2 21* 22  GLUCOSE 104* 124*  BUN 11 9  CREATININE 1.00 0.89  CALCIUM  9.3 8.7*   Liver Function Tests: Recent Labs  Lab 07/21/23 2012 07/22/23 0416  AST 38 30  ALT 22 20  ALKPHOS 85 69  BILITOT 0.7 0.8  PROT 7.7 6.7  ALBUMIN 3.5 3.0*   No results for input(s): LIPASE, AMYLASE in the last 168 hours.  No results for input(s): AMMONIA in the last 168  hours. CBC: Recent Labs  Lab 07/21/23 2012 07/22/23 0416  WBC 11.9* 9.2  NEUTROABS 9.0*  --   HGB 13.3 12.3  HCT 42.2 39.7  MCV 80.4 80.9  PLT 273 214   Cardiac Enzymes: Recent Labs  Lab 07/21/23 2012  CKTOTAL 1,928*    Urinalysis    Component Value Date/Time   COLORURINE YELLOW 07/22/2023 0247   APPEARANCEUR HAZY (A) 07/22/2023 0247   LABSPEC 1.023 07/22/2023 0247   PHURINE 5.0 07/22/2023 0247   GLUCOSEU NEGATIVE 07/22/2023 0247   HGBUR NEGATIVE 07/22/2023 0247   BILIRUBINUR NEGATIVE 07/22/2023 0247   BILIRUBINUR negative 08/20/2017 0845   BILIRUBINUR NEG 11/07/2011 1525   KETONESUR 20 (A) 07/22/2023 0247   PROTEINUR 30 (A) 07/22/2023 0247   UROBILINOGEN 0.2 08/20/2017 0845   NITRITE NEGATIVE 07/22/2023 0247   LEUKOCYTESUR MODERATE (A) 07/22/2023 0247   Sepsis Labs Recent Labs  Lab 07/21/23 2012 07/22/23 0416  WBC 11.9* 9.2   Microbiology Recent Results (from the past 240 hours)  Resp panel by RT-PCR (RSV, Flu A&B, Covid) Anterior Nasal Swab     Status: None   Collection Time: 07/21/23  8:04 PM   Specimen: Anterior Nasal Swab  Result Value Ref Range Status   SARS Coronavirus 2 by RT PCR NEGATIVE NEGATIVE Final   Influenza A by PCR NEGATIVE NEGATIVE Final   Influenza B by PCR NEGATIVE NEGATIVE Final    Comment: (NOTE) The Xpert Xpress SARS-CoV-2/FLU/RSV plus assay is intended as an aid in the diagnosis of influenza from Nasopharyngeal swab specimens and should not be used as a sole basis for treatment. Nasal washings and aspirates are unacceptable for Xpert Xpress SARS-CoV-2/FLU/RSV testing.  Fact Sheet for Patients: BloggerCourse.com  Fact Sheet for Healthcare Providers: SeriousBroker.it  This test is not yet approved or cleared by the [place]  FDA and has been authorized for detection and/or diagnosis of SARS-CoV-2 by FDA under an Emergency Use Authorization (EUA). This EUA will remain in  effect (meaning this test can be used) for the duration of the COVID-19 declaration under Section 564(b)(1) of the Act, 21 U.S.C. section 360bbb-3(b)(1), unless the authorization is terminated or revoked.     Resp Syncytial Virus by PCR NEGATIVE NEGATIVE Final    Comment: (NOTE) Fact Sheet for Patients: BloggerCourse.com  Fact Sheet for Healthcare Providers: SeriousBroker.it  This test is not yet approved or cleared by the [place]  FDA and has been authorized for detection and/or diagnosis of SARS-CoV-2 by FDA under an Emergency Use Authorization (EUA). This EUA will remain in effect (meaning this test can be used) for the duration of the COVID-19 declaration under Section 564(b)(1) of the Act, 21 U.S.C. section 360bbb-3(b)(1), unless the authorization is terminated or revoked.  Performed at [facility], [address], [place], [place] [address]      Time coordinating discharge: Over 30 minutes  SIGNED:   Elsie JAYSON Montclair, DO [facility] 07/22/2023, 1:04 PM Pager   If 7PM-7AM, please contact night-coverage [email or web address]

## 2023-07-23 ENCOUNTER — Telehealth: Payer: Self-pay

## 2023-07-23 NOTE — Transitions of Care (Post Inpatient/ED Visit) (Signed)
 07/23/2023  Name: Stacy Campbell MRN: [id number] DOB: August 03, 1955  Today's TOC FU Call Status: Today's TOC FU Call Status:: Successful TOC FU Call Completed TOC FU Call Complete Date: 07/23/23 Patient's Name and Date of Birth confirmed.  Transition Care Management Follow-up Telephone Call Date of Discharge: 07/22/23 Discharge Facility: [facility]) Type of Discharge: Inpatient Admission Primary Inpatient Discharge Diagnosis:: rhabdomy How have you been since you were released from the hospital?: Better Any questions or concerns?: No  Items Reviewed: Did you receive and understand the discharge instructions provided?: Yes Medications obtained,verified, and reconciled?: Yes (Medications Reviewed) Any new allergies since your discharge?: No Dietary orders reviewed?: Yes Do you have support at home?: Yes People in Home [RPT]: child(ren), adult  Medications Reviewed Today: Medications Reviewed Today     Reviewed by Emmitt Pan, LPN (Licensed Practical Nurse) on 07/23/23 at 1118  Med List Status: <None>   Medication Order Taking? Sig Documenting Provider Last Dose Status Informant  albuterol  (VENTOLIN  HFA) 108 (90 Base) MCG/ACT inhaler [id number] Yes Inhale 2 puffs into the lungs every 6 (six) hours as needed for wheezing or shortness of breath. [name], MD  Active Self, Pharmacy Records           Med Note LEOBARDO, NICOLE   Mon Jul 22, 2023  4:42 AM) Rescue inhaler  Aspirin -Salicylamide-Caffeine (ARTHRITIS STRENGTH BC POWDER PO) [id number] Yes Take 1 packet by mouth daily as needed. [provider]  Active Self, Pharmacy Records  busPIRone  (BUSPAR ) 15 MG tablet [id number] Yes Take 15 mg by mouth 3 (three) times daily. [provider]  Active Self, Pharmacy Records  carvedilol  (COREG ) 25 MG tablet [id number] Yes TAKE 1 TABLET(25 MG) BY MOUTH TWICE DAILY WITH A MEAL [name], MD  Active Pharmacy Records, Self  cetirizine  (ZYRTEC ) 10 MG tablet  [id number] Yes Take 10 mg by mouth daily. [provider]  Active Pharmacy Records, Self  clobetasol  ointment (TEMOVATE ) 0.05 % [id number] Yes Apply 1 Application topically 2 (two) times daily as needed. [provider]  Active Self, Pharmacy Records  desvenlafaxine (PRISTIQ) 100 MG 24 hr tablet [id number] Yes Take 100 mg by mouth daily. [provider]  Active Self, Pharmacy Records  diltiazem  (CARDIZEM  CD) 360 MG 24 hr capsule [id number] Yes Take 1 capsule (360 mg total) by mouth daily. Anner Alm ORN, MD  Active Self, Pharmacy Records  doxepin  (SINEQUAN ) 25 MG capsule [id number] Yes Take 50 mg by mouth 2 (two) times daily. [provider]  Active Self, Pharmacy Records  ELIQUIS  5 MG TABS tablet [id number] Yes TAKE 1 TABLET(5 MG) BY MOUTH TWICE DAILY Anner Alm ORN, MD  Active Self, Pharmacy Records  furosemide  (LASIX ) 20 MG tablet [id number] Yes Take 1 tablet (20 mg total) by mouth daily as needed for edema. Take with potassium Andy, Camille L, MD  Active Self, Pharmacy Records  ibuprofen  (ADVIL ) 200 MG tablet [id number] Yes Take 200 mg by mouth every 6 (six) hours as needed for mild pain (pain score 1-3). [provider]  Active Self, Pharmacy Records  meloxicam  (MOBIC ) 15 MG tablet [id number] Yes Take 15 mg by mouth daily. [provider]  Active Self, Pharmacy Records  metFORMIN  (GLUCOPHAGE -XR) 500 MG 24 hr tablet [id number] Yes Take 1 tablet (500 mg total) by mouth daily with breakfast. [name], MD  Active Self, Pharmacy Records  montelukast  (SINGULAIR ) 10 MG tablet [id number] Yes Take 10 mg by mouth daily. [provider]  Active Self, Pharmacy Records  mupirocin  ointment (BACTROBAN ) 2 % [id number] Yes Use twice a day for 7 days as needed for skin infections. [name], MD  Active Self, Pharmacy Records  mycophenolate  (CELLCEPT ) 500 MG tablet [id number] Yes Take 500 mg by mouth 2 (two) times daily. [provider]   Active Self, Pharmacy Records  nystatin  ointment (MYCOSTATIN ) [id number] Yes Apply 1 Application topically 2 (two) times daily. [provider]  Active Self, Pharmacy Records  oxyCODONE -acetaminophen  (PERCOCET ) 10-325 MG tablet [id number] Yes Take 1 tablet by mouth at bedtime. [provider]  Active Self, Pharmacy Records  potassium chloride  SA (KLOR-CON  M) 20 MEQ tablet [id number] Yes Take 1 tablet (20 mEq total) by mouth daily as needed (leg swelling). Take with furosemide  [name], MD  Active Self, Pharmacy Records  promethazine  (PHENERGAN ) 12.5 MG tablet [id number] Yes Take 12.5 mg by mouth every 12 (twelve) hours as needed for vomiting or nausea. [provider]  Active Self, Pharmacy Records  rosuvastatin  (CRESTOR ) 10 MG tablet [id number] Yes Take 1 tablet (10 mg total) by mouth at bedtime. TAKE 1 TABLET(10 MG) BY MOUTH DAILY [name], MD  Active Self, Pharmacy Records  tacrolimus (PROTOPIC) 0.1 % ointment [id number] Yes Apply 1 Application topically 2 (two) times daily. [provider]  Active Self, Pharmacy Records  tirzepatide  (MOUNJARO ) 7.5 MG/0.5ML Pen [id number] Yes Inject 7.5 mg into the skin once a week. [name], MD  Active Self, Pharmacy Records           Med Note LEOBARDO NAT Kitchens Jul 22, 2023  4:57 AM) Patient injects on Sunday.  tiZANidine  (ZANAFLEX ) 4 MG tablet [id number] Yes Take 4 mg by mouth 2 (two) times daily. [provider]  Active Self, Pharmacy Records            Home Care and Equipment/Supplies: Were Home Health Services Ordered?: Yes Name of Home Health Agency:: [facility] Has Agency set up a time to come to your home?: No Any new equipment or medical supplies ordered?: Yes Name of Medical supply agency?: [facility] Were you able to get the equipment/medical supplies?: Yes Do you have any questions related to the use of the equipment/supplies?: No  Functional Questionnaire: Do you need assistance with  bathing/showering or dressing?: No Do you need assistance with meal preparation?: No Do you need assistance with eating?: No Do you have difficulty maintaining continence: No Do you need assistance with getting out of bed/getting out of a chair/moving?: No Do you have difficulty managing or taking your medications?: No  Follow up appointments reviewed: PCP Follow-up appointment confirmed?: No (appt schedule for 08/06/2023 by hospital. sent message to staff to schedule within 7 days) MD Provider Line Number:[phone] Given: No Specialist Hospital Follow-up appointment confirmed?: NA Do you need transportation to your follow-up appointment?: No Do you understand care options if your condition(s) worsen?: Yes-patient verbalized understanding    SIGNATURE Julian Lemmings, LPN [facility] Nurse Health Advisor Direct Dial [phone]

## 2023-08-02 ENCOUNTER — Telehealth: Payer: Self-pay | Admitting: Family Medicine

## 2023-08-02 NOTE — Telephone Encounter (Signed)
 [facility] faxed document Home Health Certificate (Order ID [phone] [id number]), to be filled out by provider. Patient requested to send it back via Fax within ASAP. Document is located in providers tray at front office.Please advise at [phone].

## 2023-08-05 DIAGNOSIS — N390 Urinary tract infection, site not specified: Secondary | ICD-10-CM | POA: Diagnosis not present

## 2023-08-05 DIAGNOSIS — E1159 Type 2 diabetes mellitus with other circulatory complications: Secondary | ICD-10-CM | POA: Diagnosis not present

## 2023-08-05 DIAGNOSIS — J4489 Other specified chronic obstructive pulmonary disease: Secondary | ICD-10-CM

## 2023-08-05 DIAGNOSIS — M47812 Spondylosis without myelopathy or radiculopathy, cervical region: Secondary | ICD-10-CM

## 2023-08-05 DIAGNOSIS — F419 Anxiety disorder, unspecified: Secondary | ICD-10-CM

## 2023-08-05 DIAGNOSIS — I152 Hypertension secondary to endocrine disorders: Secondary | ICD-10-CM | POA: Diagnosis not present

## 2023-08-05 DIAGNOSIS — M47816 Spondylosis without myelopathy or radiculopathy, lumbar region: Secondary | ICD-10-CM

## 2023-08-05 DIAGNOSIS — I4819 Other persistent atrial fibrillation: Secondary | ICD-10-CM

## 2023-08-05 DIAGNOSIS — K7460 Unspecified cirrhosis of liver: Secondary | ICD-10-CM

## 2023-08-05 DIAGNOSIS — F330 Major depressive disorder, recurrent, mild: Secondary | ICD-10-CM

## 2023-08-05 DIAGNOSIS — T796XXD Traumatic ischemia of muscle, subsequent encounter: Secondary | ICD-10-CM | POA: Diagnosis not present

## 2023-08-05 DIAGNOSIS — G894 Chronic pain syndrome: Secondary | ICD-10-CM

## 2023-08-05 NOTE — Telephone Encounter (Signed)
 Form has been placed in providers box

## 2023-08-06 ENCOUNTER — Ambulatory Visit (INDEPENDENT_AMBULATORY_CARE_PROVIDER_SITE_OTHER)

## 2023-08-06 VITALS — Ht 64.0 in | Wt 233.0 lb

## 2023-08-06 DIAGNOSIS — Z0000 Encounter for general adult medical examination without abnormal findings: Secondary | ICD-10-CM

## 2023-08-06 NOTE — Patient Instructions (Signed)
 Stacy Campbell , Thank you for taking time out of your busy schedule to complete your Annual Wellness Visit with me. I enjoyed our conversation and look forward to speaking with you again next year. I, as well as your care team,  appreciate your ongoing commitment to your health goals. Please review the following plan we discussed and let me know if I can assist you in the future. Your Game plan/ To Do List    Referrals: If you haven't heard from the office you've been referred to, please reach out to them at the phone provided.   Follow up Visits: Next Medicare AWV with our clinical staff: 08/11/24   Have you seen your provider in the last 6 months (3 months if uncontrolled diabetes)? Yes Next Office Visit with your provider: 09/27/23  Clinician Recommendations: Each day, aim for 6 glasses of water, plenty of protein in your diet and try to get up and walk/ stretch every hour for 5-10 minutes at a time.        This is a list of the screening recommended for you and due dates:  Health Maintenance  Topic Date Due   Flu Shot  08/09/2023   Hemoglobin A1C  12/27/2023   Complete foot exam   03/21/2024   DEXA scan (bone density measurement)  04/21/2024   Eye exam for diabetics  05/21/2024   Yearly kidney health urinalysis for diabetes  06/26/2024   Mammogram  07/17/2024   Yearly kidney function blood test for diabetes  07/21/2024   Medicare Annual Wellness Visit  08/05/2024   Colon Cancer Screening  12/14/2029   Pneumococcal Vaccine for age over 13  Completed   Hepatitis B Vaccine  Completed   Hepatitis C Screening  Completed   HPV Vaccine  Aged Out   Meningitis B Vaccine  Aged Out   DTaP/Tdap/Td vaccine  Discontinued   COVID-19 Vaccine  Discontinued   Zoster (Shingles) Vaccine  Discontinued    Advanced directives: (Copy Requested) Please bring a copy of your health care power of attorney and living will to the office to be added to your chart at your convenience. You can mail to [facility] [address] [place], [place] [address] or email to [email or web address] Advance Care Planning is important because it:  [x]  Makes sure you receive the medical care that is consistent with your values, goals, and preferences  [x]  It provides guidance to your family and loved ones and reduces their decisional burden about whether or not they are making the right decisions based on your wishes.  Follow the link provided in your after visit summary or read over the paperwork we have mailed to you to help you started getting your Advance Directives in place. If you need assistance in completing these, please reach out to us  so that we can help you!  See attachments for Preventive Care and Fall Prevention Tips.

## 2023-08-06 NOTE — Progress Notes (Signed)
 Subjective:   Stacy Campbell is a 68 y.o. who presents for a Medicare Wellness preventive visit.  As a reminder, Annual Wellness Visits don't include a physical exam, and some assessments may be limited, especially if this visit is performed virtually. We may recommend an in-person follow-up visit with your provider if needed.  Visit Complete: Virtual I connected with  Stacy Campbell on 08/06/23 by a audio enabled telemedicine application and verified that I am speaking with the correct person using two identifiers.  Patient Location: Home  Provider Location: Office/Clinic  I discussed the limitations of evaluation and management by telemedicine. The patient expressed understanding and agreed to proceed.  Vital Signs: Because this visit was a virtual/telehealth visit, some criteria may be missing or patient reported. Any vitals not documented were not able to be obtained and vitals that have been documented are patient reported.  VideoDeclined- This patient declined Librarian, academic. Therefore the visit was completed with audio only.  Persons Participating in Visit: Patient.  AWV Questionnaire: No: Patient Medicare AWV questionnaire was not completed prior to this visit.  Cardiac Risk Factors include: advanced age (>53men, >72 women);dyslipidemia;diabetes mellitus;hypertension;obesity (BMI >30kg/m2);sedentary lifestyle     Objective:    Today's Vitals   08/06/23 1528  Weight: 233 lb (105.7 kg)  Height: 5' 4 (1.626 m)  PainSc: 10-Worst pain ever   Body mass index is 39.99 kg/m.     08/06/2023    3:36 PM 07/21/2023    8:22 PM 09/12/2022   10:05 PM 09/12/2022   11:09 AM 07/30/2022    3:52 PM 10/18/2021    8:23 AM 09/27/2021   12:18 PM  Advanced Directives  Does Patient Have a Medical Advance Directive? Yes No No No No Yes No  Type of Estate agent of [place];Living will     Healthcare Power of [place];Living  will   Copy of Healthcare Power of Attorney in Chart? No - copy requested     No - copy requested   Would patient like information on creating a medical advance directive?   Yes (Inpatient - patient requests chaplain consult to create a medical advance directive)  No - Patient declined  Yes (ED - Information included in AVS)    Current Medications (verified) Outpatient Encounter Medications as of 08/06/2023  Medication Sig   albuterol  (VENTOLIN  HFA) 108 (90 Base) MCG/ACT inhaler Inhale 2 puffs into the lungs every 6 (six) hours as needed for wheezing or shortness of breath.   Aspirin -Salicylamide-Caffeine (ARTHRITIS STRENGTH BC POWDER PO) Take 1 packet by mouth daily as needed.   busPIRone  (BUSPAR ) 15 MG tablet Take 15 mg by mouth 3 (three) times daily.   carvedilol  (COREG ) 25 MG tablet TAKE 1 TABLET(25 MG) BY MOUTH TWICE DAILY WITH A MEAL   cetirizine  (ZYRTEC ) 10 MG tablet Take 10 mg by mouth daily.   clobetasol  ointment (TEMOVATE ) 0.05 % Apply 1 Application topically 2 (two) times daily as needed.   desvenlafaxine (PRISTIQ) 100 MG 24 hr tablet Take 100 mg by mouth daily.   diltiazem  (CARDIZEM  CD) 360 MG 24 hr capsule Take 1 capsule (360 mg total) by mouth daily.   doxepin  (SINEQUAN ) 25 MG capsule Take 50 mg by mouth 2 (two) times daily.   ELIQUIS  5 MG TABS tablet TAKE 1 TABLET(5 MG) BY MOUTH TWICE DAILY   furosemide  (LASIX ) 20 MG tablet Take 1 tablet (20 mg total) by mouth daily as needed for edema. Take with potassium  ibuprofen  (ADVIL ) 200 MG tablet Take 200 mg by mouth every 6 (six) hours as needed for mild pain (pain score 1-3).   meloxicam  (MOBIC ) 15 MG tablet Take 15 mg by mouth daily.   metFORMIN  (GLUCOPHAGE -XR) 500 MG 24 hr tablet Take 1 tablet (500 mg total) by mouth daily with breakfast.   montelukast  (SINGULAIR ) 10 MG tablet Take 10 mg by mouth daily.   mupirocin  ointment (BACTROBAN ) 2 % Use twice a day for 7 days as needed for skin infections.   mycophenolate  (CELLCEPT ) 500 MG  tablet Take 500 mg by mouth 2 (two) times daily.   nystatin  ointment (MYCOSTATIN ) Apply 1 Application topically 2 (two) times daily.   oxyCODONE -acetaminophen  (PERCOCET ) 10-325 MG tablet Take 1 tablet by mouth at bedtime.   potassium chloride  SA (KLOR-CON  M) 20 MEQ tablet Take 1 tablet (20 mEq total) by mouth daily as needed (leg swelling). Take with furosemide    promethazine  (PHENERGAN ) 12.5 MG tablet Take 12.5 mg by mouth every 12 (twelve) hours as needed for vomiting or nausea.   rosuvastatin  (CRESTOR ) 10 MG tablet Take 1 tablet (10 mg total) by mouth at bedtime. TAKE 1 TABLET(10 MG) BY MOUTH DAILY   tacrolimus (PROTOPIC) 0.1 % ointment Apply 1 Application topically 2 (two) times daily.   tirzepatide  (MOUNJARO ) 7.5 MG/0.5ML Pen Inject 7.5 mg into the skin once a week.   tiZANidine  (ZANAFLEX ) 4 MG tablet Take 4 mg by mouth 2 (two) times daily.   No facility-administered encounter medications on file as of 08/06/2023.    Allergies (verified) Alitraq, Hydrocodone , Linalool, Methylisothiazolinone, Propylene glycol, Aspirin , Egg-derived products, Hydrocodone -guaifenesin , and Pineapple   History: Past Medical History:  Diagnosis Date   Angio-edema    Anxiety    Arthritis    lower back (01/29/2017)   Asthma    Bone spur    spine   Chronic lower back pain    Cirrhosis of liver (HCC) 12/2018   Depression    Diabetes mellitus without complication (HCC)    Eczema    GERD (gastroesophageal reflux disease)    Hepatitis C    was treated for this   Hyperplastic polyp of intestine    Hypertension    Insomnia    OSA (obstructive sleep apnea)    Pending formal sleep study   Osteoarthritis    Osteoporosis    Paroxysmal atrial fibrillation (HCC) 09/27/2021   cardioversion in 1990s; Noted to be in Afib-RVR when being evaluated by sleep medicine.   Splenic artery aneurysm (HCC)    Urticaria    Past Surgical History:  Procedure Laterality Date   CARDIOVERSION  1990s   CARDIOVERSION N/A  10/18/2021   Procedure: CARDIOVERSION;  Surgeon: Jeffrie Oneil BROCKS, MD;  Location: [facility];  Service: Cardiovascular;  Laterality: N/A;   CHOLECYSTECTOMY N/A 01/22/2018   Procedure: LAPAROSCOPIC CHOLECYSTECTOMY ERAS PATHWAY;  Surgeon: Signe Mitzie LABOR, MD;  Location: [facility] ORS;  Service: General;  Laterality: N/A;   CYSTOSCOPY W/ URETERAL STENT PLACEMENT Left 01/29/2017   Procedure: CYSTOSCOPY WITH RETROGRADE PYELOGRAM/URETERAL STENT PLACEMENT;  Surgeon: Cam Morene ORN, MD;  Location: [facility] OR;  Service: Urology;  Laterality: Left;   CYSTOSCOPY WITH RETROGRADE PYELOGRAM, URETEROSCOPY AND STENT PLACEMENT Left 01/29/2017   CYSTOSCOPY WITH RETROGRADE PYELOGRAM/URETERAL STENT PLACEMENT   NM MYOVIEW  LTD  10/28/2017   Nuclear stress EF: 63%. The left ventricular ejection fraction is normal (55-65%). There was no ST segment deviation noted during stress. The study is normal.  This is a low risk study.   NM  MYOVIEW  LTD  11/06/2021   ?  Myocardial infarction located in the anterior region? ->  Despite this, there is a EF of 71% with no RWMA.  READ AS LOW RISK/LOW RISK; in fact, on review of images the anterior defect appeared to be consistent with breast attenuation.  No evidence of ischemia or infarction.  Preserved EF 71%.   TRANSTHORACIC ECHOCARDIOGRAM  01/30/2017   Normal LV size and function-hyperdynamic/vigorous.  EF 65 to 70%. No RWMA.  Focal basal hypertrophy - Minimal LVOT gradient (2.6 m/s).  GRII DD.  Calcified AoV with no stenosis.  Mild MAC.  Trivial TR.  Peak PAP 55 mmHg.   Family History  Problem Relation Age of Onset   Asthma Mother    Diabetes Mother    Asthma Father    Diabetes Father    Cancer Father        prostate   Eczema Father    Alcoholism Brother    Asthma Brother    Diabetes Brother    Asthma Brother    Diabetes Brother    Asthma Brother    Diabetes Brother    Asthma Brother    Diabetes Brother    Hepatitis B Daughter    Breast cancer Neg Hx    Sleep apnea Neg  Hx    Social History   Socioeconomic History   Marital status: Widowed    Spouse name: Not on file   Number of children: Not on file   Years of education: Not on file   Highest education level: Not on file  Occupational History   Not on file  Tobacco Use   Smoking status: Former    Current packs/day: 0.00    Average packs/day: 0.3 packs/day for 37.0 years (9.3 ttl pk-yrs)    Types: Cigarettes    Start date: 03/31/1971    Quit date: 03/30/2008    Years since quitting: 15.3   Smokeless tobacco: Never   Tobacco comments:    Former smoker 10/04/21  Vaping Use   Vaping status: Never Used  Substance and Sexual Activity   Alcohol  use: No   Drug use: Never   Sexual activity: Not Currently    Birth control/protection: Post-menopausal  Other Topics Concern   Not on file  Social History Narrative   Caffiene none. Soda 2 daily.   Education: 12 th grade.Working Development worker, community.   Children 6, grandkids 18.       Social Drivers of Corporate investment banker Strain: Low Risk  (08/06/2023)   Overall Financial Resource Strain (CARDIA)    Difficulty of Paying Living Expenses: Not hard at all  Food Insecurity: No Food Insecurity (08/06/2023)   Hunger Vital Sign    Worried About Running Out of Food in the Last Year: Never true    Ran Out of Food in the Last Year: Never true  Transportation Needs: No Transportation Needs (08/06/2023)   PRAPARE - Administrator, Civil Service (Medical): No    Lack of Transportation (Non-Medical): No  Physical Activity: Inactive (08/06/2023)   Exercise Vital Sign    Days of Exercise per Week: 0 days    Minutes of Exercise per Session: 0 min  Stress: No Stress Concern Present (08/06/2023)   [employer] of Occupational Health - Occupational Stress Questionnaire    Feeling of Stress: Not at all  Social Connections: Moderately Isolated (08/06/2023)   Social Connection and Isolation Panel    Frequency of Communication with Friends and Family: More  than three times a week    Frequency of Social Gatherings with Friends and Family: Once a week    Attends Religious Services: More than 4 times per year    Active Member of [employer] or Organizations: No    Attends Banker Meetings: Never    Marital Status: Widowed    Tobacco Counseling Counseling given: Not Answered Tobacco comments: Former smoker 10/04/21    Clinical Intake:  Pre-visit preparation completed: Yes  Pain : 0-10 Pain Score: 10-Worst pain ever Pain Type: Acute pain Pain Location: Coccyx Pain Descriptors / Indicators: Aching Pain Onset: 1 to 4 weeks ago Pain Frequency: Constant     BMI - recorded: 39.99 Nutritional Status: BMI > 30  Obese Diabetes: Yes CBG done?: Yes (120 per pt before meal) CBG resulted in Enter/ Edit results?: No Did pt. bring in CBG monitor from home?: No  Lab Results  Component Value Date   HGBA1C 6.5 06/27/2023   HGBA1C 5.8 (A) 03/22/2023   HGBA1C 5.3 12/20/2022     How often do you need to have someone help you when you read instructions, pamphlets, or other written materials from your doctor or pharmacy?: 1 - Never  Interpreter Needed?: No  Information entered by :: Ellouise Haws, LPN   Activities of Daily Living     08/06/2023    3:32 PM 07/22/2023    3:10 PM  In your present state of health, do you have any difficulty performing the following activities:  Hearing? 0   Vision? 0   Difficulty concentrating or making decisions? 0   Walking or climbing stairs? 1   Comment after fall use a walker   Dressing or bathing? 0   Doing errands, shopping? 0 0  Preparing Food and eating ? N   Using the Toilet? N   In the past six months, have you accidently leaked urine? N   Do you have problems with loss of bowel control? N   Managing your Medications? N   Managing your Finances? N   Housekeeping or managing your Housekeeping? N     Patient Care Team: [name], MD as PCP - General (Family  Medicine) Anner Alm ORN, MD as PCP - Cardiology (Cardiology) Burnette Fallow, MD as Consulting Physician (Gastroenterology) Merilee Laymon MATSU, NP as Nurse Practitioner (Psychology) Nicholaus Sherlean CROME, [facility] (Inactive) (Pharmacist)  I have updated your Care Teams any recent Medical Services you may have received from other providers in the past year.     Assessment:   This is a routine wellness examination for [place].  Hearing/Vision screen Hearing Screening - Comments:: Pt denies any hearing issues  Vision Screening - Comments:: Wears rx glasses - up to date with routine eye exams with Dr Joshua [facility]    Goals Addressed   None    Depression Screen     08/06/2023    3:38 PM 06/27/2023   10:03 AM 03/22/2023   11:04 AM 01/16/2023   10:46 AM 12/20/2022    9:50 AM 10/01/2022    3:00 PM 10/01/2022    2:58 PM  PHQ 2/9 Scores  PHQ - 2 Score 0 0 0 2 4 4  0  PHQ- 9 Score    12 12 10      Fall Risk     08/06/2023    3:37 PM 06/27/2023   10:03 AM 03/22/2023   11:04 AM 01/16/2023   10:46 AM 12/20/2022    9:50 AM  Fall Risk  Falls in the past year? 1 0 0 1 0  Number falls in past yr: 1 0 0 0 0  Injury with Fall? 1 0 0 1 0  Comment coccyx      Risk for fall due to : History of fall(s);Impaired balance/gait;Impaired mobility No Fall Risks History of fall(s)  No Fall Risks  Follow up Falls prevention discussed Falls evaluation completed Falls evaluation completed Falls evaluation completed Falls evaluation completed    MEDICARE RISK AT HOME:  Medicare Risk at Home Any stairs in or around the home?: Yes If so, are there any without handrails?: No Home free of loose throw rugs in walkways, pet beds, electrical cords, etc?: Yes Adequate lighting in your home to reduce risk of falls?: Yes Life alert?: No Use of a cane, walker or w/c?: Yes Grab bars in the bathroom?: No Shower chair or bench in shower?: Yes Elevated toilet seat or a handicapped toilet?: No  TIMED UP AND  GO:  Was the test performed?  No  Cognitive Function: 6CIT completed        08/06/2023    3:39 PM 07/30/2022    3:54 PM 07/28/2021    2:24 PM 07/15/2020    2:42 PM  6CIT Screen  What Year? 0 points 0 points 0 points 0 points  What month? 0 points 0 points 0 points 0 points  What time? 0 points 0 points 0 points 0 points  Count back from 20 0 points 0 points 0 points 0 points  Months in reverse 0 points 4 points 0 points 0 points  Repeat phrase 0 points 2 points 0 points 0 points  Total Score 0 points 6 points 0 points 0 points    Immunizations Immunization History  Administered Date(s) Administered   Fluad Quad(high Dose 65+) 11/18/2020, 10/06/2021   Fluad Trivalent(High Dose 65+) 10/01/2022   Hepatitis B, ADULT 10/12/2014, 11/09/2014, 04/11/2015   Influenza Split 11/14/2010, 10/09/2011   Influenza Whole 10/13/2007, 01/10/2009, 11/22/2009   Influenza,inj,Quad PF,6+ Mos 10/10/2012, 10/08/2013, 10/15/2014, 09/29/2015, 11/06/2016, 09/10/2017, 10/15/2018, 09/30/2019   PFIZER(Purple Top)SARS-COV-2 Vaccination 03/20/2019, 04/15/2019, 10/15/2019   PNEUMOCOCCAL CONJUGATE-20 11/18/2020   Pneumococcal Polysaccharide-23 01/10/2009   Td 01/09/1999, 01/10/2009    Screening Tests Health Maintenance  Topic Date Due   INFLUENZA VACCINE  08/09/2023   HEMOGLOBIN A1C  12/27/2023   FOOT EXAM  03/21/2024   DEXA SCAN  04/21/2024   OPHTHALMOLOGY EXAM  05/21/2024   Diabetic kidney evaluation - Urine ACR  06/26/2024   MAMMOGRAM  07/17/2024   Diabetic kidney evaluation - eGFR measurement  07/21/2024   Medicare Annual Wellness (AWV)  08/05/2024   Colonoscopy  12/14/2029   Pneumococcal Vaccine: 50+ Years  Completed   Hepatitis B Vaccines  Completed   Hepatitis C Screening  Completed   HPV VACCINES  Aged Out   Meningococcal B Vaccine  Aged Out   DTaP/Tdap/Td  Discontinued   COVID-19 Vaccine  Discontinued   Zoster Vaccines- Shingrix  Discontinued    Health Maintenance  There are no  preventive care reminders to display for this patient.  Health Maintenance Items Addressed: See Nurse Notes at the end of this note  Additional Screening:  Vision Screening: Recommended annual ophthalmology exams for early detection of glaucoma and other disorders of the eye. Would you like a referral to an eye doctor? No    Dental Screening: Recommended annual dental exams for proper oral hygiene  Community Resource Referral / Chronic Care Management: CRR required this visit?  No  CCM required this visit?  No   Plan:    I have personally reviewed and noted the following in the patient's chart:   Medical and social history Use of alcohol , tobacco or illicit drugs  Current medications and supplements including opioid prescriptions. Patient is currently taking opioid prescriptions. Information provided to patient regarding non-opioid alternatives. Patient advised to discuss non-opioid treatment plan with their provider. Functional ability and status Nutritional status Physical activity Advanced directives List of other physicians Hospitalizations, surgeries, and ER visits in previous 12 months Vitals Screenings to include cognitive, depression, and falls Referrals and appointments  In addition, I have reviewed and discussed with patient certain preventive protocols, quality metrics, and best practice recommendations. A written personalized care plan for preventive services as well as general preventive health recommendations were provided to patient.   Ellouise VEAR Haws, LPN   [id number]/70/7974   After Visit Summary: (MyChart) Due to this being a telephonic visit, the after visit summary with patients personalized plan was offered to patient via MyChart   Notes: Nothing significant to report at this time.

## 2023-09-03 ENCOUNTER — Ambulatory Visit (INDEPENDENT_AMBULATORY_CARE_PROVIDER_SITE_OTHER): Admitting: Podiatry

## 2023-09-03 ENCOUNTER — Encounter: Payer: Self-pay | Admitting: Podiatry

## 2023-09-03 DIAGNOSIS — M79675 Pain in left toe(s): Secondary | ICD-10-CM

## 2023-09-03 DIAGNOSIS — B351 Tinea unguium: Secondary | ICD-10-CM

## 2023-09-03 DIAGNOSIS — M79674 Pain in right toe(s): Secondary | ICD-10-CM | POA: Diagnosis not present

## 2023-09-03 NOTE — Progress Notes (Unsigned)
 Subjective:  Patient ID: Stacy Campbell, female    DOB: Mar 13, 1955,  MRN: [id number]  Stacy Campbell presents to clinic today for:  Chief Complaint  Patient presents with   Diabetes    [facility]  A1c 5.0  Eliquis .  Possible calluses bilateral 5th meta   Patient notes nails are thick, discolored, elongated and painful in shoegear when trying to ambulate.  She notes she just had 1 cataract repaired and is due next month to have the other eye repaired  PCP is [name], MD.  Past Medical History:  Diagnosis Date   Angio-edema    Anxiety    Arthritis    lower back (01/29/2017)   Asthma    Bone spur    spine   Chronic lower back pain    Cirrhosis of liver (HCC) 12/2018   Depression    Diabetes mellitus without complication (HCC)    Eczema    GERD (gastroesophageal reflux disease)    Hepatitis C    was treated for this   Hyperplastic polyp of intestine    Hypertension    Insomnia    OSA (obstructive sleep apnea)    Pending formal sleep study   Osteoarthritis    Osteoporosis    Paroxysmal atrial fibrillation (HCC) 09/27/2021   cardioversion in 1990s; Noted to be in Afib-RVR when being evaluated by sleep medicine.   Splenic artery aneurysm (HCC)    Urticaria    Past Surgical History:  Procedure Laterality Date   CARDIOVERSION  1990s   CARDIOVERSION N/A 10/18/2021   Procedure: CARDIOVERSION;  Surgeon: Jeffrie Oneil BROCKS, MD;  Location: [facility] ENDOSCOPY;  Service: Cardiovascular;  Laterality: N/A;   CHOLECYSTECTOMY N/A 01/22/2018   Procedure: LAPAROSCOPIC CHOLECYSTECTOMY ERAS PATHWAY;  Surgeon: Signe Mitzie LABOR, MD;  Location: [facility] ORS;  Service: General;  Laterality: N/A;   CYSTOSCOPY W/ URETERAL STENT PLACEMENT Left 01/29/2017   Procedure: CYSTOSCOPY WITH RETROGRADE PYELOGRAM/URETERAL STENT PLACEMENT;  Surgeon: Cam Morene ORN, MD;  Location: [facility] OR;  Service: Urology;  Laterality: Left;   CYSTOSCOPY WITH RETROGRADE PYELOGRAM, URETEROSCOPY AND STENT PLACEMENT  Left 01/29/2017   CYSTOSCOPY WITH RETROGRADE PYELOGRAM/URETERAL STENT PLACEMENT   NM MYOVIEW  LTD  10/28/2017   Nuclear stress EF: 63%. The left ventricular ejection fraction is normal (55-65%). There was no ST segment deviation noted during stress. The study is normal.  This is a low risk study.   NM MYOVIEW  LTD  11/06/2021   ?  Myocardial infarction located in the anterior region? ->  Despite this, there is a EF of 71% with no RWMA.  READ AS LOW RISK/LOW RISK; in fact, on review of images the anterior defect appeared to be consistent with breast attenuation.  No evidence of ischemia or infarction.  Preserved EF 71%.   TRANSTHORACIC ECHOCARDIOGRAM  01/30/2017   Normal LV size and function-hyperdynamic/vigorous.  EF 65 to 70%. No RWMA.  Focal basal hypertrophy - Minimal LVOT gradient (2.6 m/s).  GRII DD.  Calcified AoV with no stenosis.  Mild MAC.  Trivial TR.  Peak PAP 55 mmHg.   Allergies  Allergen Reactions   Alitraq Nausea And Vomiting   Hydrocodone  Itching   Linalool    Methylisothiazolinone Other (See Comments)    Other reaction(s): Other (See Comments) Positive patch test Positive patch test    Propylene Glycol    Aspirin  Itching and Other (See Comments)    Lower dose doesn't make her itch (she takes it every day)   Egg-Derived Products Nausea  Only and Other (See Comments)    No reaction if in another food   Hydrocodone -Guaifenesin  Rash   Pineapple Itching and Nausea Only    Review of Systems: Negative except as noted in the HPI.  Objective:  Stacy Campbell is a pleasant 68 y.o. female in NAD. AAO x 3.  Vascular Examination: Capillary refill time is 3-5 seconds to toes bilateral. Palpable pedal pulses b/l LE. Digital hair present b/l.  Skin temperature gradient WNL b/l. No varicosities b/l. No cyanosis noted b/l.   Dermatological Examination: Pedal skin with normal turgor, texture and tone b/l. No open wounds. No interdigital macerations b/l. Toenails x10 are 3mm  thick, discolored, dystrophic with subungual debris. There is pain with compression of the nail plates.  They are elongated x10     Latest Ref Rng & Units 06/27/2023   10:53 AM 03/22/2023   11:08 AM 12/20/2022    9:58 AM 10/01/2022    3:42 PM  Hemoglobin A1C  Hemoglobin-A1c 4.6 - 6.5 % 6.5  5.8  5.3  6.3    Assessment/Plan: 1. Pain due to onychomycosis of toenails of both feet     The mycotic toenails were sharply debrided x10 with sterile nail nippers and a power debriding burr to decrease bulk/thickness and length.    Return in about 3 months (around 12/04/2023) for [facility].   Awanda CHARM Imperial, DPM, FACFAS [facility]     [address] [place], [place] [address]                Office [phone]  Fax [phone]

## 2023-09-27 ENCOUNTER — Ambulatory Visit: Admitting: Family Medicine

## 2023-09-30 ENCOUNTER — Encounter: Payer: Self-pay | Admitting: Family Medicine

## 2023-09-30 ENCOUNTER — Ambulatory Visit: Admitting: Family Medicine

## 2023-09-30 VITALS — BP 148/79 | HR 84 | Temp 98.4°F | Ht 64.0 in | Wt 233.4 lb

## 2023-09-30 DIAGNOSIS — E1159 Type 2 diabetes mellitus with other circulatory complications: Secondary | ICD-10-CM | POA: Diagnosis not present

## 2023-09-30 DIAGNOSIS — Z7984 Long term (current) use of oral hypoglycemic drugs: Secondary | ICD-10-CM

## 2023-09-30 DIAGNOSIS — F339 Major depressive disorder, recurrent, unspecified: Secondary | ICD-10-CM

## 2023-09-30 DIAGNOSIS — Z23 Encounter for immunization: Secondary | ICD-10-CM | POA: Diagnosis not present

## 2023-09-30 DIAGNOSIS — T162XXA Foreign body in left ear, initial encounter: Secondary | ICD-10-CM | POA: Diagnosis not present

## 2023-09-30 DIAGNOSIS — E119 Type 2 diabetes mellitus without complications: Secondary | ICD-10-CM

## 2023-09-30 DIAGNOSIS — I152 Hypertension secondary to endocrine disorders: Secondary | ICD-10-CM

## 2023-09-30 LAB — POCT GLYCOSYLATED HEMOGLOBIN (HGB A1C): Hemoglobin A1C: 5.8 % — AB (ref 4.0–5.6)

## 2023-09-30 MED ORDER — LISINOPRIL 10 MG PO TABS: 10.0000 mg | ORAL_TABLET | Freq: Every day | ORAL | 3 refills | Status: AC

## 2023-09-30 NOTE — Progress Notes (Signed)
 Subjective  CC:  Chief Complaint  Patient presents with   Diabetes    HPI: Stacy Campbell is a 68 y.o. female who presents to the office today for follow up of diabetes and problems listed above in the chief complaint.  Discussed the use of AI scribe software for clinical note transcription with the patient, who gave verbal consent to proceed.  History of Present Illness Stacy Campbell is a 68 year old female who presents for a follow-up visit to monitor her diabetes and blood pressure.  Her most recent A1c is 5.8. She is currently taking Mounjaro  and metformin  for diabetes management.  She feels her diabetes is doing well.  No symptoms of hyperglycemia and no hypoglycemia.  Her weight is stable.  No foot concerns.  Eye exam is up-to-date.  Flu shot eligible today.  Hypertension follow-up: Takes carvedilol  twice a day, diltiazem  and uses Lasix  for lower extremity edema.  She has been on amlodipine , hydrochlorothiazide  and lisinopril  in the past.  This was stopped about a year ago when she was hypotensive and in acute kidney failure.  She reports that her blood pressure goes up and down.  She denies chest pain or shortness of breath.  No palpitations.  She does not check her blood pressures at home.  Blood pressure is mildly elevated in the office today.  Above goal.  She experiences a vibrating sensation in her ear, described as feeling like 'a hair is in it' or a 'buzzy' sensation, similar to ringing.  She feels like there could be a hair or something in the ear.  No pain.  No current problems with her feet, and her breathing is stable. Her mood is 'all right', and she feels stronger since her hospitalization.  Chronic depression is stable on medications.  No issues with her thinking have been noted.    Wt Readings from Last 3 Encounters:  09/30/23 233 lb 6.4 oz (105.9 kg)  08/06/23 233 lb (105.7 kg)  07/21/23 233 lb 4 oz (105.8 kg)    BP Readings from Last 3  Encounters:  09/30/23 (!) 148/79  07/22/23 138/80  06/27/23 136/72    Assessment  1. Controlled type 2 diabetes mellitus without complication, without long-term current use of insulin (HCC)   2. Need for influenza vaccination   3. Hypertension associated with diabetes (HCC)   4. Foreign body of left ear, initial encounter   5. Major depression, recurrent, chronic (HCC)      Plan  Assessment and Plan Assessment & Plan Foreign body (hair) in left ear canal Sensation of vibration or ringing due to hair in ear canal. - Flush out hair from left ear canal.  Essential hypertension Blood pressure slightly elevated and fluctuating. Lisinopril  previously discontinued due to kidney issues. - Restart lisinopril  at low dose.  10 mg daily.  Continue Cardizem  and carvedilol . - Schedule lab visit in 2-3 weeks to monitor kidney function. - Schedule follow-up appointment in 3 months to assess blood pressure control.  Type 2 diabetes mellitus without complications Diabetes well-controlled with A1c of 5.8 on Mounjaro  and metformin .  Depression is controlled.  Continue mood medications.  Flu shot updated today.  Follow up: 2 weeks for BMP lab visit, 3 months for blood pressure and diabetes recheck Orders Placed This Encounter  Procedures   Flu vaccine HIGH DOSE PF(Fluzone Trivalent)   Basic metabolic panel with GFR   POCT HgB A1C   Meds ordered this encounter  Medications   lisinopril  (ZESTRIL )  10 MG tablet    Sig: Take 1 tablet (10 mg total) by mouth daily.    Dispense:  90 tablet    Refill:  3      Immunization History  Administered Date(s) Administered   Fluad Quad(high Dose 65+) 11/18/2020, 10/06/2021   Fluad Trivalent(High Dose 65+) 10/01/2022   Hepatitis B, ADULT 10/12/2014, 11/09/2014, 04/11/2015   INFLUENZA, HIGH DOSE SEASONAL PF 09/30/2023   Influenza Split 11/14/2010, 10/09/2011   Influenza Whole 10/13/2007, 01/10/2009, 11/22/2009   Influenza,inj,Quad PF,6+ Mos  10/10/2012, 10/08/2013, 10/15/2014, 09/29/2015, 11/06/2016, 09/10/2017, 10/15/2018, 09/30/2019   PFIZER(Purple Top)SARS-COV-2 Vaccination 03/20/2019, 04/15/2019, 10/15/2019   PNEUMOCOCCAL CONJUGATE-20 11/18/2020   Pneumococcal Polysaccharide-23 01/10/2009   Td 01/09/1999, 01/10/2009    Diabetes Related Lab Review: Lab Results  Component Value Date   HGBA1C 5.8 (A) 09/30/2023   HGBA1C 6.5 06/27/2023   HGBA1C 5.8 (A) 03/22/2023    Lab Results  Component Value Date   MICROALBUR <0.7 06/27/2023   Lab Results  Component Value Date   CREATININE 0.89 07/22/2023   BUN 9 07/22/2023   NA 138 07/22/2023   K 3.7 07/22/2023   CL 104 07/22/2023   CO2 22 07/22/2023   Lab Results  Component Value Date   CHOL 126 06/27/2023   CHOL 108 06/20/2022   CHOL 102 02/24/2021   Lab Results  Component Value Date   HDL 42.50 06/27/2023   HDL 44.30 06/20/2022   HDL 36.30 (L) 02/24/2021   Lab Results  Component Value Date   LDLCALC 58 06/27/2023   LDLCALC 42 06/20/2022   LDLCALC 27 02/24/2021   Lab Results  Component Value Date   TRIG 128.0 06/27/2023   TRIG 111.0 06/20/2022   TRIG 194.0 (H) 02/24/2021   Lab Results  Component Value Date   CHOLHDL 3 06/27/2023   CHOLHDL 2 06/20/2022   CHOLHDL 3 02/24/2021   No results found for: LDLDIRECT The ASCVD Risk score (Arnett DK, et al., 2019) failed to calculate for the following reasons:   The valid total cholesterol range is 130 to 320 mg/dL I have reviewed the PMH, Fam and Soc history. Patient Active Problem List   Diagnosis Date Noted   Mixed simple and mucopurulent chronic bronchitis (HCC) 06/20/2022    Priority: High   Chronic narcotic dependence (HCC) 06/20/2022    Priority: High   Polypharmacy 06/20/2022    Priority: High   Persistent atrial fibrillation (HCC) 10/04/2021    Priority: High   Secondary hypercoagulable state (HCC) 10/04/2021    Priority: High   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 01/04/2020    Priority:  High   Monoclonal gammopathy of unknown significance (MGUS) 01/23/2019    Priority: High   Controlled type 2 diabetes mellitus without complication, without long-term current use of insulin (HCC) 12/12/2018    Priority: High    Diagnosed 2020 Metformin  500 xr daily Mounjaro   Stopped metformin   03/2023 due to excellent diabetic control with mounjaro     Hyperlipidemia associated with type 2 diabetes mellitus (HCC) 12/12/2018    Priority: High    LDL goal of 70 given diabetes    Asthma with COPD (HCC) 05/25/2013    Priority: High   Morbid obesity     Priority: High   Major depression, recurrent, chronic (HCC) 03/07/2006    Priority: High    Sees psychiatry: Laymon Glatter, FNP; [facility] Psychotherapy with Rosaline Angus, PsyD    Hypertension associated with diabetes (HCC) 03/07/2006    Priority: High  Chronic low back pain 03/07/2006    Priority: High   Insomnia 03/07/2006    Priority: High    On chronic Ambien  (started by previous provider).     Obstructive sleep apnea hypopnea, mild 06/20/2022    Priority: Medium     2024, [place] [facility]; mild. No treatment recommended    Aortic ejection murmur 12/06/2021    Priority: Medium    DJD (degenerative joint disease) of cervical spine 09/06/2021    Priority: Medium    Chronic urticaria 06/12/2021    Priority: Medium     Has had chronic pred use for years/eczema    Degenerative joint disease (DJD) of lumbar spine 01/04/2020    Priority: Medium    History of hepatitis C 10/11/2017    Priority: Medium     S/P Treatment with sofosbuvir and simeprivir.  Followed by hep clinic.  Undetectable viral load - Cured.    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 Her smoking use included cigarettes. She started smoking about 52 years ago. She has a 9.3 pack-year smoking history. She has never used smokeless tobacco. She reports that she does not drink alcohol  and does not use drugs.  Review of Systems: Ophthalmic: negative for eye pain, loss of vision or double vision Cardiovascular: negative for chest pain Respiratory: negative for SOB or persistent cough Gastrointestinal: negative for abdominal pain Genitourinary: negative for dysuria or gross hematuria MSK: negative for foot lesions Neurologic: negative for weakness or gait disturbance  Objective  Vitals: BP (!) 148/79   Pulse 84   Temp 98.4 F (36.9 C)   Ht 5' 4 (1.626 m)   Wt 233 lb 6.4 oz (105.9 kg)   SpO2 98%   BMI 40.06 kg/m  General: well appearing, no acute distress  Psych:  Alert and oriented, normal mood and affect HEENT:  Normocephalic, atraumatic, moist mucous membranes, supple neck left TM normal with ear and external auditory canal visible Cardiovascular:  Nl S1 and S2, RRR without murmur, gallop or rub. no edema Respiratory:  Good breath sounds bilaterally, CTAB with normal effort, no rales  Diabetic education: ongoing education regarding chronic disease management for diabetes was given today. We continue to reinforce the ABC's of diabetic management: A1c (<7 or 8 dependent upon patient), tight blood pressure control, and cholesterol management with goal LDL < 100 minimally. We discuss diet strategies, exercise recommendations, medication options and possible side effects. At each visit, we review recommended immunizations and preventive care recommendations for diabetics and stress that good diabetic control can prevent other problems. See below for this patient's data. Commons side  effects, risks, benefits, and alternatives for medications and treatment plan prescribed today were discussed, and the patient expressed understanding of the given instructions. Patient is instructed to call or message via MyChart if he/she has any questions or concerns regarding our treatment plan. No barriers to understanding were identified. We discussed Red Flag symptoms and signs in detail. Patient expressed understanding regarding what to do in case of urgent  or emergency type symptoms.  Medication list was reconciled, printed and provided to the patient in AVS. Patient instructions and summary information was reviewed with the patient as documented in the AVS. This note was prepared with assistance of Dragon voice recognition software. Occasional wrong-word or sound-a-like substitutions may have occurred due to the inherent limitations of voice recognition software

## 2023-09-30 NOTE — Patient Instructions (Signed)
 Please return in 2 weeks for a lab visit to recheck your potassium and kidney function after starting lisinopril  to help your blood pressure.  Also, schedule a visit in 17mo to recheck your blood pressure and diabetes.   If you have any questions or concerns, please don't hesitate to send me a message via MyChart or call the office at [phone]. Thank you for visiting with us  today! It's our pleasure caring for you.

## 2023-10-10 ENCOUNTER — Inpatient Hospital Stay (HOSPITAL_COMMUNITY)
Admission: EM | Admit: 2023-10-10 | Discharge: 2023-10-15 | DRG: 690 | Disposition: A | Discharge status: Home Health | Attending: Internal Medicine | Admitting: Internal Medicine

## 2023-10-10 ENCOUNTER — Emergency Department (HOSPITAL_COMMUNITY)

## 2023-10-10 ENCOUNTER — Observation Stay (HOSPITAL_COMMUNITY)

## 2023-10-10 DIAGNOSIS — N390 Urinary tract infection, site not specified: Secondary | ICD-10-CM | POA: Diagnosis not present

## 2023-10-10 DIAGNOSIS — Z79899 Other long term (current) drug therapy: Secondary | ICD-10-CM

## 2023-10-10 DIAGNOSIS — M5432 Sciatica, left side: Secondary | ICD-10-CM | POA: Diagnosis present

## 2023-10-10 DIAGNOSIS — Z885 Allergy status to narcotic agent status: Secondary | ICD-10-CM

## 2023-10-10 DIAGNOSIS — M47816 Spondylosis without myelopathy or radiculopathy, lumbar region: Secondary | ICD-10-CM | POA: Diagnosis present

## 2023-10-10 DIAGNOSIS — Z886 Allergy status to analgesic agent status: Secondary | ICD-10-CM

## 2023-10-10 DIAGNOSIS — E872 Acidosis, unspecified: Secondary | ICD-10-CM | POA: Diagnosis present

## 2023-10-10 DIAGNOSIS — W19XXXA Unspecified fall, initial encounter: Principal | ICD-10-CM | POA: Diagnosis present

## 2023-10-10 DIAGNOSIS — E669 Obesity, unspecified: Secondary | ICD-10-CM | POA: Diagnosis present

## 2023-10-10 DIAGNOSIS — I10 Essential (primary) hypertension: Secondary | ICD-10-CM | POA: Diagnosis present

## 2023-10-10 DIAGNOSIS — Z8619 Personal history of other infectious and parasitic diseases: Secondary | ICD-10-CM

## 2023-10-10 DIAGNOSIS — E119 Type 2 diabetes mellitus without complications: Principal | ICD-10-CM | POA: Diagnosis present

## 2023-10-10 DIAGNOSIS — D472 Monoclonal gammopathy: Secondary | ICD-10-CM | POA: Diagnosis present

## 2023-10-10 DIAGNOSIS — Z6841 Body Mass Index (BMI) 40.0 and over, adult: Secondary | ICD-10-CM

## 2023-10-10 DIAGNOSIS — M810 Age-related osteoporosis without current pathological fracture: Secondary | ICD-10-CM | POA: Diagnosis present

## 2023-10-10 DIAGNOSIS — N3000 Acute cystitis without hematuria: Secondary | ICD-10-CM | POA: Diagnosis not present

## 2023-10-10 DIAGNOSIS — Z7984 Long term (current) use of oral hypoglycemic drugs: Secondary | ICD-10-CM

## 2023-10-10 DIAGNOSIS — Z7982 Long term (current) use of aspirin: Secondary | ICD-10-CM

## 2023-10-10 DIAGNOSIS — Z833 Family history of diabetes mellitus: Secondary | ICD-10-CM

## 2023-10-10 DIAGNOSIS — Z7901 Long term (current) use of anticoagulants: Secondary | ICD-10-CM

## 2023-10-10 DIAGNOSIS — Z825 Family history of asthma and other chronic lower respiratory diseases: Secondary | ICD-10-CM

## 2023-10-10 DIAGNOSIS — Z7985 Long-term (current) use of injectable non-insulin antidiabetic drugs: Secondary | ICD-10-CM

## 2023-10-10 DIAGNOSIS — K7460 Unspecified cirrhosis of liver: Secondary | ICD-10-CM | POA: Diagnosis present

## 2023-10-10 DIAGNOSIS — M51369 Other intervertebral disc degeneration, lumbar region without mention of lumbar back pain or lower extremity pain: Secondary | ICD-10-CM | POA: Diagnosis present

## 2023-10-10 DIAGNOSIS — I480 Paroxysmal atrial fibrillation: Secondary | ICD-10-CM | POA: Diagnosis present

## 2023-10-10 DIAGNOSIS — R531 Weakness: Secondary | ICD-10-CM | POA: Diagnosis not present

## 2023-10-10 DIAGNOSIS — L409 Psoriasis, unspecified: Secondary | ICD-10-CM | POA: Diagnosis present

## 2023-10-10 DIAGNOSIS — E876 Hypokalemia: Secondary | ICD-10-CM | POA: Diagnosis present

## 2023-10-10 DIAGNOSIS — M6282 Rhabdomyolysis: Secondary | ICD-10-CM | POA: Diagnosis present

## 2023-10-10 DIAGNOSIS — Z791 Long term (current) use of non-steroidal anti-inflammatories (NSAID): Secondary | ICD-10-CM

## 2023-10-10 DIAGNOSIS — Z9181 History of falling: Secondary | ICD-10-CM

## 2023-10-10 DIAGNOSIS — G8929 Other chronic pain: Secondary | ICD-10-CM | POA: Diagnosis present

## 2023-10-10 DIAGNOSIS — Z79624 Long term (current) use of inhibitors of nucleotide synthesis: Secondary | ICD-10-CM

## 2023-10-10 DIAGNOSIS — R748 Abnormal levels of other serum enzymes: Secondary | ICD-10-CM | POA: Diagnosis present

## 2023-10-10 DIAGNOSIS — M545 Low back pain, unspecified: Secondary | ICD-10-CM

## 2023-10-10 DIAGNOSIS — F32A Depression, unspecified: Secondary | ICD-10-CM | POA: Diagnosis present

## 2023-10-10 DIAGNOSIS — K219 Gastro-esophageal reflux disease without esophagitis: Secondary | ICD-10-CM | POA: Diagnosis present

## 2023-10-10 DIAGNOSIS — F419 Anxiety disorder, unspecified: Secondary | ICD-10-CM | POA: Diagnosis present

## 2023-10-10 DIAGNOSIS — R5383 Other fatigue: Secondary | ICD-10-CM | POA: Diagnosis present

## 2023-10-10 DIAGNOSIS — Z87891 Personal history of nicotine dependence: Secondary | ICD-10-CM

## 2023-10-10 DIAGNOSIS — R262 Difficulty in walking, not elsewhere classified: Secondary | ICD-10-CM | POA: Diagnosis present

## 2023-10-10 DIAGNOSIS — Z5320 Procedure and treatment not carried out because of patient's decision for unspecified reasons: Secondary | ICD-10-CM | POA: Diagnosis not present

## 2023-10-10 DIAGNOSIS — E785 Hyperlipidemia, unspecified: Secondary | ICD-10-CM | POA: Diagnosis present

## 2023-10-10 DIAGNOSIS — Z91012 Allergy to eggs, unspecified: Secondary | ICD-10-CM

## 2023-10-10 DIAGNOSIS — Z79891 Long term (current) use of opiate analgesic: Secondary | ICD-10-CM

## 2023-10-10 DIAGNOSIS — M48061 Spinal stenosis, lumbar region without neurogenic claudication: Secondary | ICD-10-CM | POA: Diagnosis present

## 2023-10-10 DIAGNOSIS — R39198 Other difficulties with micturition: Secondary | ICD-10-CM | POA: Diagnosis present

## 2023-10-10 DIAGNOSIS — M5127 Other intervertebral disc displacement, lumbosacral region: Secondary | ICD-10-CM | POA: Diagnosis present

## 2023-10-10 DIAGNOSIS — Z888 Allergy status to other drugs, medicaments and biological substances status: Secondary | ICD-10-CM

## 2023-10-10 DIAGNOSIS — L309 Dermatitis, unspecified: Secondary | ICD-10-CM | POA: Diagnosis present

## 2023-10-10 DIAGNOSIS — G4733 Obstructive sleep apnea (adult) (pediatric): Secondary | ICD-10-CM | POA: Diagnosis present

## 2023-10-10 DIAGNOSIS — Z91018 Allergy to other foods: Secondary | ICD-10-CM

## 2023-10-10 DIAGNOSIS — Y92009 Unspecified place in unspecified non-institutional (private) residence as the place of occurrence of the external cause: Secondary | ICD-10-CM

## 2023-10-10 DIAGNOSIS — J4489 Other specified chronic obstructive pulmonary disease: Secondary | ICD-10-CM | POA: Diagnosis present

## 2023-10-10 LAB — CBC WITH DIFFERENTIAL/PLATELET
Abs Immature Granulocytes: 0.04 K/uL (ref 0.00–0.07)
Basophils Absolute: 0 K/uL (ref 0.0–0.1)
Basophils Relative: 0 %
Eosinophils Absolute: 0 K/uL (ref 0.0–0.5)
Eosinophils Relative: 1 %
HCT: 44.6 % (ref 36.0–46.0)
Hemoglobin: 13.5 g/dL (ref 12.0–15.0)
Immature Granulocytes: 1 %
Lymphocytes Relative: 10 %
Lymphs Abs: 0.8 K/uL (ref 0.7–4.0)
MCH: 24.5 pg — ABNORMAL LOW (ref 26.0–34.0)
MCHC: 30.3 g/dL (ref 30.0–36.0)
MCV: 80.8 fL (ref 80.0–100.0)
Monocytes Absolute: 0.6 K/uL (ref 0.1–1.0)
Monocytes Relative: 7 %
Neutro Abs: 6.6 K/uL (ref 1.7–7.7)
Neutrophils Relative %: 81 %
Platelets: 238 K/uL (ref 150–400)
RBC: 5.52 MIL/uL — ABNORMAL HIGH (ref 3.87–5.11)
RDW: 14 % (ref 11.5–15.5)
WBC: 8.1 K/uL (ref 4.0–10.5)
nRBC: 0 % (ref 0.0–0.2)

## 2023-10-10 LAB — GLUCOSE, CAPILLARY
Glucose-Capillary: 133 mg/dL — ABNORMAL HIGH (ref 70–99)
Glucose-Capillary: 127 mg/dL — ABNORMAL HIGH (ref 70–99)

## 2023-10-10 LAB — URINALYSIS, ROUTINE W REFLEX MICROSCOPIC
Bilirubin Urine: NEGATIVE
Glucose, UA: NEGATIVE mg/dL
Ketones, ur: 20 mg/dL — AB
Nitrite: NEGATIVE
Protein, ur: 30 mg/dL — AB
Specific Gravity, Urine: 1.018 (ref 1.005–1.030)
WBC, UA: >50 WBC/hpf (ref 0–5)
pH: 5 (ref 5.0–8.0)

## 2023-10-10 LAB — COMPREHENSIVE METABOLIC PANEL WITH GFR
ALT: 18 U/L (ref 0–44)
AST: 77 U/L — ABNORMAL HIGH (ref 15–41)
Albumin: 4.1 g/dL (ref 3.5–5.0)
Alkaline Phosphatase: 95 U/L (ref 38–126)
Anion gap: 17 — ABNORMAL HIGH (ref 5–15)
BUN: 8 mg/dL (ref 8–23)
CO2: 21 mmol/L — ABNORMAL LOW (ref 22–32)
Calcium: 9.5 mg/dL (ref 8.9–10.3)
Chloride: 108 mmol/L (ref 98–111)
Creatinine, Ser: 0.82 mg/dL (ref 0.44–1.00)
GFR, Estimated: >60 mL/min (ref >60)
Glucose, Bld: 146 mg/dL — ABNORMAL HIGH (ref 70–99)
Potassium: 3.7 mmol/L (ref 3.5–5.1)
Sodium: 145 mmol/L (ref 135–145)
Total Bilirubin: 0.4 mg/dL (ref 0.0–1.2)
Total Protein: 7.6 g/dL (ref 6.5–8.1)

## 2023-10-10 LAB — CK: Total CK: 3325 U/L — ABNORMAL HIGH (ref 38–234)

## 2023-10-10 LAB — LACTIC ACID, PLASMA
Lactic Acid, Venous: 3.2 mmol/L (ref 0.5–1.9)
Lactic Acid, Venous: 3 mmol/L (ref 0.5–1.9)

## 2023-10-10 LAB — CBG MONITORING, ED: Glucose-Capillary: 148 mg/dL — ABNORMAL HIGH (ref 70–99)

## 2023-10-10 LAB — I-STAT CG4 LACTIC ACID, ED
Lactic Acid, Venous: 4.3 mmol/L (ref 0.5–1.9)
Lactic Acid, Venous: 4.4 mmol/L (ref 0.5–1.9)

## 2023-10-10 LAB — VITAMIN B12: Vitamin B-12: 357 pg/mL (ref 180–914)

## 2023-10-10 MED ORDER — ENOXAPARIN SODIUM 40 MG/0.4ML IJ SOSY: 40.0000 mg | PREFILLED_SYRINGE | Freq: Every day | INTRAMUSCULAR | Status: DC

## 2023-10-10 MED ORDER — SODIUM CHLORIDE 0.9 % IV BOLUS
1000.0000 mL | Freq: Once | INTRAVENOUS | Status: AC
  Administered 2023-10-10: 1000 mL via INTRAVENOUS

## 2023-10-10 MED ORDER — MORPHINE SULFATE (PF) 2 MG/ML IV SOLN: 2.0000 mg | INTRAVENOUS | Status: DC | PRN

## 2023-10-10 MED ORDER — APIXABAN 2.5 MG PO TABS
5.0000 mg | ORAL_TABLET | Freq: Two times a day (BID) | ORAL | Status: DC
  Administered 2023-10-10 – 2023-10-15 (×10): 5 mg via ORAL
  Filled 2023-10-10 (×10): qty 2

## 2023-10-10 MED ORDER — ALBUTEROL SULFATE HFA 108 (90 BASE) MCG/ACT IN AERS: 2.0000 | INHALATION_SPRAY | Freq: Four times a day (QID) | RESPIRATORY_TRACT | Status: DC | PRN

## 2023-10-10 MED ORDER — POLYETHYLENE GLYCOL 3350 17 G PO PACK: 17.0000 g | PACK | Freq: Every day | ORAL | Status: DC | PRN

## 2023-10-10 MED ORDER — CHLORHEXIDINE GLUCONATE CLOTH 2 % EX PADS
6.0000 | MEDICATED_PAD | Freq: Every day | CUTANEOUS | Status: DC
Start: 2023-10-10 — End: 2023-10-15
  Administered 2023-10-12: 6 via TOPICAL

## 2023-10-10 MED ORDER — CARVEDILOL 12.5 MG PO TABS
25.0000 mg | ORAL_TABLET | Freq: Two times a day (BID) | ORAL | Status: DC
Start: 2023-10-10 — End: 2023-10-11
  Administered 2023-10-10: 25 mg via ORAL
  Filled 2023-10-10: qty 2

## 2023-10-10 MED ORDER — ACETAMINOPHEN 650 MG RE SUPP: 650.0000 mg | Freq: Four times a day (QID) | RECTAL | Status: DC | PRN

## 2023-10-10 MED ORDER — SODIUM CHLORIDE 0.9% FLUSH
3.0000 mL | Freq: Two times a day (BID) | INTRAVENOUS | Status: DC
  Administered 2023-10-10 – 2023-10-15 (×7): 3 mL via INTRAVENOUS

## 2023-10-10 MED ORDER — ALBUTEROL SULFATE (2.5 MG/3ML) 0.083% IN NEBU: 2.5000 mg | INHALATION_SOLUTION | Freq: Four times a day (QID) | RESPIRATORY_TRACT | Status: DC | PRN

## 2023-10-10 MED ORDER — DILTIAZEM HCL ER COATED BEADS 240 MG PO CP24
360.0000 mg | ORAL_CAPSULE | Freq: Every day | ORAL | Status: DC
  Administered 2023-10-10: 360 mg via ORAL
  Filled 2023-10-10: qty 3

## 2023-10-10 MED ORDER — LACTATED RINGERS IV BOLUS
1000.0000 mL | Freq: Once | INTRAVENOUS | Status: AC
  Administered 2023-10-10: 1000 mL via INTRAVENOUS

## 2023-10-10 MED ORDER — OXYCODONE HCL 5 MG PO TABS
5.0000 mg | ORAL_TABLET | ORAL | Status: DC | PRN
  Administered 2023-10-11 – 2023-10-12 (×3): 5 mg via ORAL
  Filled 2023-10-10 (×3): qty 1

## 2023-10-10 MED ORDER — BISACODYL 5 MG PO TBEC: 10.0000 mg | DELAYED_RELEASE_TABLET | Freq: Every day | ORAL | Status: DC | PRN

## 2023-10-10 MED ORDER — DOXEPIN HCL 50 MG PO CAPS
50.0000 mg | ORAL_CAPSULE | Freq: Two times a day (BID) | ORAL | Status: DC
  Administered 2023-10-10 – 2023-10-15 (×10): 50 mg via ORAL
  Filled 2023-10-10 (×10): qty 1

## 2023-10-10 MED ORDER — ONDANSETRON HCL 4 MG PO TABS: 4.0000 mg | ORAL_TABLET | Freq: Four times a day (QID) | ORAL | Status: DC | PRN

## 2023-10-10 MED ORDER — VITAMIN B-12 1000 MCG PO TABS
1000.0000 ug | ORAL_TABLET | Freq: Every day | ORAL | Status: DC
  Administered 2023-10-11 – 2023-10-15 (×5): 1000 ug via ORAL
  Filled 2023-10-10 (×5): qty 1

## 2023-10-10 MED ORDER — VENLAFAXINE HCL ER 75 MG PO CP24
150.0000 mg | ORAL_CAPSULE | Freq: Every day | ORAL | Status: DC
  Administered 2023-10-11 – 2023-10-15 (×5): 150 mg via ORAL
  Filled 2023-10-10: qty 2
  Filled 2023-10-10 (×4): qty 4

## 2023-10-10 MED ORDER — LORAZEPAM 2 MG/ML IJ SOLN
0.5000 mg | Freq: Once | INTRAMUSCULAR | Status: AC | PRN
  Administered 2023-10-11: 0.5 mg via INTRAVENOUS
  Filled 2023-10-10: qty 1

## 2023-10-10 MED ORDER — SODIUM CHLORIDE 0.9 % IV SOLN: INTRAVENOUS | Status: DC

## 2023-10-10 MED ORDER — SODIUM CHLORIDE 0.9 % IV SOLN
1.0000 g | Freq: Once | INTRAVENOUS | Status: AC
  Administered 2023-10-10: 1 g via INTRAVENOUS
  Filled 2023-10-10: qty 10

## 2023-10-10 MED ORDER — ACETAMINOPHEN 500 MG PO TABS
1000.0000 mg | ORAL_TABLET | Freq: Once | ORAL | Status: AC
  Administered 2023-10-10: 1000 mg via ORAL
  Filled 2023-10-10: qty 2

## 2023-10-10 MED ORDER — INSULIN ASPART 100 UNIT/ML IJ SOLN
0.0000 [IU] | Freq: Three times a day (TID) | INTRAMUSCULAR | Status: DC
  Administered 2023-10-10: 3 [IU] via SUBCUTANEOUS
  Administered 2023-10-11: 4 [IU] via SUBCUTANEOUS
  Administered 2023-10-11 – 2023-10-12 (×3): 3 [IU] via SUBCUTANEOUS
  Filled 2023-10-10: qty 0.2

## 2023-10-10 MED ORDER — ONDANSETRON HCL 4 MG/2ML IJ SOLN
4.0000 mg | Freq: Four times a day (QID) | INTRAMUSCULAR | Status: DC | PRN
  Filled 2023-10-10: qty 2

## 2023-10-10 MED ORDER — SODIUM CHLORIDE 0.9 % IV SOLN: 250.0000 mL | INTRAVENOUS | Status: AC | PRN

## 2023-10-10 MED ORDER — ACETAMINOPHEN 325 MG PO TABS
650.0000 mg | ORAL_TABLET | Freq: Four times a day (QID) | ORAL | Status: DC | PRN
  Administered 2023-10-11: 650 mg via ORAL
  Filled 2023-10-10: qty 2

## 2023-10-10 MED ORDER — SODIUM CHLORIDE 0.9% FLUSH: 3.0000 mL | INTRAVENOUS | Status: DC | PRN

## 2023-10-10 MED ORDER — SODIUM CHLORIDE 0.9 % IV SOLN
1.0000 g | INTRAVENOUS | Status: AC
  Administered 2023-10-11 – 2023-10-14 (×4): 1 g via INTRAVENOUS
  Filled 2023-10-10 (×4): qty 10

## 2023-10-10 MED ORDER — BUSPIRONE HCL 10 MG PO TABS
15.0000 mg | ORAL_TABLET | Freq: Three times a day (TID) | ORAL | Status: DC
  Administered 2023-10-10 – 2023-10-15 (×14): 15 mg via ORAL
  Filled 2023-10-10: qty 3
  Filled 2023-10-10 (×6): qty 1.5
  Filled 2023-10-10: qty 3
  Filled 2023-10-10: qty 1.5
  Filled 2023-10-10 (×3): qty 3
  Filled 2023-10-10 (×2): qty 1.5
  Filled 2023-10-10: qty 3
  Filled 2023-10-10 (×3): qty 1.5
  Filled 2023-10-10 (×2): qty 3
  Filled 2023-10-10: qty 1.5
  Filled 2023-10-10: qty 3
  Filled 2023-10-10: qty 1.5
  Filled 2023-10-10: qty 3
  Filled 2023-10-10: qty 1.5
  Filled 2023-10-10 (×3): qty 3

## 2023-10-10 MED ORDER — TIZANIDINE HCL 4 MG PO TABS
4.0000 mg | ORAL_TABLET | Freq: Two times a day (BID) | ORAL | Status: DC
  Administered 2023-10-10 – 2023-10-15 (×10): 4 mg via ORAL
  Filled 2023-10-10 (×10): qty 1

## 2023-10-10 MED ORDER — LISINOPRIL 10 MG PO TABS
10.0000 mg | ORAL_TABLET | Freq: Every day | ORAL | Status: DC
  Administered 2023-10-10 – 2023-10-11 (×2): 10 mg via ORAL
  Filled 2023-10-10 (×2): qty 1

## 2023-10-10 NOTE — ED Notes (Addendum)
 SABRA

## 2023-10-10 NOTE — ED Triage Notes (Signed)
 Pt BIB EMS from home. Had fall @ 3am found on ground by son at 26. C/O of soreness in right shoulder soreness and leg  weakness.No LOC but there is dizziness. Hx of afib.  AO4    EMS vitals  BP 174/92 HR 120 SPO2 100 CBG 179

## 2023-10-10 NOTE — H&P (Signed)
 [facility] History and Physical   Patient: Stacy Campbell FMW:[id number]   PCP: [name], MD DOB: 03-31-1955   DOA: 10/10/2023   DOS: 10/10/2023   DOS: the patient was seen and examined on 10/10/2023   Patient coming from: The patient is coming from Home  Chief Complaint: Fall at home and weakness due to chronic lower backache and left-sided sciatica  HPI: Stacy Campbell is a 68 y.o. 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 As per patient she has been having multiple falls, she had a fall yesterday and her brother helped her to get up and she woke up in the middle of the night and had another fall so she laid on the floor for 3 hours until her brother found her in the morning and called ambulance.  Patient is just feeling weakness while walking and her legs gave away.  Denied any LOC, no head trauma.   ED Course: VS afebrile HR 126, RR 14, BP 158/124, 97% on RA Lactic acidosis lactic acid 4.4 BMP: Glucose 146, bicarb 21, anion gap 17, AST 77, rest within normal range Rhabdomyolysis, CK 3325 UA positive Follow blood culture and urine culture CBC within normal range, no leukocytosis.  CT head negative for any acute findings CT C-spine: Chronic DJD, no acute findings, no fracture X-ray right shoulder and x-ray pelvis negative for any fracture Lumbar x-ray negative for any fracture Follow MRI lumbar spine    Review of Systems: as mentioned in the history of present illness.  All other systems reviewed and are negative.  Past Medical History:  Diagnosis Date   Angio-edema    Anxiety    Arthritis    lower back (01/29/2017)   Asthma    Bone spur    spine   Chronic lower back  pain    Cirrhosis of liver (HCC) 12/2018   Depression    Diabetes mellitus without complication (HCC)    Eczema    GERD (gastroesophageal reflux disease)    Hepatitis C    was treated for this   Hyperplastic polyp of intestine    Hypertension    Insomnia    OSA (obstructive sleep apnea)    Pending formal sleep study   Osteoarthritis    Osteoporosis    Paroxysmal atrial fibrillation (HCC) 09/27/2021   cardioversion in 1990s; Noted to be in Afib-RVR when being evaluated by sleep medicine.   Splenic artery aneurysm    Urticaria    Past Surgical History:  Procedure Laterality Date   CARDIOVERSION  1990s   CARDIOVERSION N/A 10/18/2021   Procedure: CARDIOVERSION;  Surgeon: Jeffrie Oneil BROCKS, MD;  Location: [facility];  Service: Cardiovascular;  Laterality: N/A;   CHOLECYSTECTOMY N/A 01/22/2018   Procedure: LAPAROSCOPIC CHOLECYSTECTOMY ERAS PATHWAY;  Surgeon: Signe Mitzie LABOR, MD;  Location: [facility];  Service: General;  Laterality: N/A;   CYSTOSCOPY W/ URETERAL STENT PLACEMENT Left 01/29/2017   Procedure: CYSTOSCOPY WITH RETROGRADE PYELOGRAM/URETERAL STENT PLACEMENT;  Surgeon: Cam Morene ORN, MD;  Location: [facility] OR;  Service: Urology;  Laterality: Left;   CYSTOSCOPY WITH RETROGRADE PYELOGRAM, URETEROSCOPY AND STENT PLACEMENT Left 01/29/2017   CYSTOSCOPY WITH RETROGRADE PYELOGRAM/URETERAL STENT PLACEMENT   NM MYOVIEW  LTD  10/28/2017   Nuclear stress EF:  63%. The left ventricular ejection fraction is normal (55-65%). There was no ST segment deviation noted during stress. The study is normal.  This is a low risk study.   NM MYOVIEW  LTD  11/06/2021   ?  Myocardial infarction located in the anterior region? ->  Despite this, there is a EF of 71% with no RWMA.  READ AS LOW RISK/LOW RISK; in fact, on review of images the anterior defect appeared to be consistent with breast attenuation.  No evidence of ischemia or infarction.  Preserved EF 71%.   TRANSTHORACIC ECHOCARDIOGRAM  01/30/2017    Normal LV size and function-hyperdynamic/vigorous.  EF 65 to 70%. No RWMA.  Focal basal hypertrophy - Minimal LVOT gradient (2.6 m/s).  GRII DD.  Calcified AoV with no stenosis.  Mild MAC.  Trivial TR.  Peak PAP 55 mmHg.   Social History:  reports that she quit smoking about 15 years ago. Her smoking use included cigarettes. She started smoking about 52 years ago. She has a 9.3 pack-year smoking history. She has never used smokeless tobacco. She reports that she does not drink alcohol  and does not use drugs.  Allergies  Allergen Reactions   Alitraq Nausea And Vomiting   Hydrocodone  Itching   Linalool    Methylisothiazolinone Other (See Comments)    Other reaction(s): Other (See Comments) Positive patch test Positive patch test    Propylene Glycol    Aspirin  Itching and Other (See Comments)    Lower dose doesn't make her itch (she takes it every day)   Egg-Derived Products Nausea Only and Other (See Comments)    No reaction if in another food   Hydrocodone -Guaifenesin  Rash   Pineapple Itching and Nausea Only     Family history reviewed and not pertinent Family History  Problem Relation Age of Onset   Asthma Mother    Diabetes Mother    Asthma Father    Diabetes Father    Cancer Father        prostate   Eczema Father    Alcoholism Brother    Asthma Brother    Diabetes Brother    Asthma Brother    Diabetes Brother    Asthma Brother    Diabetes Brother    Asthma Brother    Diabetes Brother    Hepatitis B Daughter    Breast cancer Neg Hx    Sleep apnea Neg Hx      Prior to Admission medications   Medication Sig Start Date End Date Taking? Authorizing Provider  albuterol  (VENTOLIN  HFA) 108 (90 Base) MCG/ACT inhaler Inhale 2 puffs into the lungs every 6 (six) hours as needed for wheezing or shortness of breath. 04/05/23   [name], MD  Aspirin -Salicylamide-Caffeine (ARTHRITIS STRENGTH BC POWDER PO) Take 1 packet by mouth daily as needed.    [provider]   busPIRone  (BUSPAR ) 15 MG tablet Take 15 mg by mouth 3 (three) times daily. 05/27/23   [provider]  carvedilol  (COREG ) 25 MG tablet TAKE 1 TABLET(25 MG) BY MOUTH TWICE DAILY WITH A MEAL 12/15/21   [name], MD  cetirizine  (ZYRTEC ) 10 MG tablet Take 10 mg by mouth daily. 09/28/21   [provider]  clobetasol  ointment (TEMOVATE ) 0.05 % Apply 1 Application topically 2 (two) times daily as needed.    [provider]  desvenlafaxine (PRISTIQ) 100 MG 24 hr tablet Take 100 mg by mouth daily. 07/20/23   [provider]  diltiazem  (CARDIZEM  CD) 360 MG 24  hr capsule Take 1 capsule (360 mg total) by mouth daily. 05/14/23   Anner Alm ORN, MD  doxepin  (SINEQUAN ) 25 MG capsule Take 50 mg by mouth 2 (two) times daily. 02/11/23   [provider]  ELIQUIS  5 MG TABS tablet TAKE 1 TABLET(5 MG) BY MOUTH TWICE DAILY 07/11/23   Anner Alm ORN, MD  furosemide  (LASIX ) 20 MG tablet Take 1 tablet (20 mg total) by mouth daily as needed for edema. Take with potassium 01/16/23   [name], MD  ibuprofen  (ADVIL ) 200 MG tablet Take 200 mg by mouth every 6 (six) hours as needed for mild pain (pain score 1-3).    [provider]  lisinopril  (ZESTRIL ) 10 MG tablet Take 1 tablet (10 mg total) by mouth daily. 09/30/23   [name], MD  meloxicam  (MOBIC ) 15 MG tablet Take 15 mg by mouth daily.    [provider]  metFORMIN  (GLUCOPHAGE -XR) 500 MG 24 hr tablet Take 1 tablet (500 mg total) by mouth daily with breakfast. 07/01/23   [name], MD  montelukast  (SINGULAIR ) 10 MG tablet Take 10 mg by mouth daily.    [provider]  mupirocin  ointment (BACTROBAN ) 2 % Use twice a day for 7 days as needed for skin infections. 06/27/23   [name], MD  mycophenolate  (CELLCEPT ) 500 MG tablet Take 500 mg by mouth 2 (two) times daily. 02/11/23   [provider]  nystatin  ointment (MYCOSTATIN ) Apply 1 Application topically 2 (two) times daily.  02/11/23 02/11/24  [provider]  oxyCODONE -acetaminophen  (PERCOCET ) 10-325 MG tablet Take 1 tablet by mouth at bedtime. 07/04/21   [provider]  potassium chloride  SA (KLOR-CON  M) 20 MEQ tablet Take 1 tablet (20 mEq total) by mouth daily as needed (leg swelling). Take with furosemide  01/16/23   [name], MD  promethazine  (PHENERGAN ) 12.5 MG tablet Take 12.5 mg by mouth every 12 (twelve) hours as needed for vomiting or nausea. 05/14/23   [provider]  rosuvastatin  (CRESTOR ) 10 MG tablet Take 1 tablet (10 mg total) by mouth at bedtime. TAKE 1 TABLET(10 MG) BY MOUTH DAILY 06/27/23   [name], MD  tacrolimus (PROTOPIC) 0.1 % ointment Apply 1 Application topically 2 (two) times daily. 02/11/23   [provider]  tirzepatide  (MOUNJARO ) 7.5 MG/0.5ML Pen Inject 7.5 mg into the skin once a week. 12/20/22   [name], MD  tiZANidine  (ZANAFLEX ) 4 MG tablet Take 4 mg by mouth 2 (two) times daily. 07/20/23   [provider]    Physical Exam: Vitals:   10/10/23 0718 10/10/23 0720 10/10/23 0722 10/10/23 0950  BP:   (!) 158/124 (!) 159/126  Pulse:   (!) 126 (!) 119  Resp:   14 18  Temp: 98.2 F (36.8 C)  98.2 F (36.8 C) 98.5 F (36.9 C)  TempSrc:   Oral   SpO2:   97% 96%  Weight:  105.8 kg    Height:  5' 4 (1.626 m)      General: alert and oriented to time, place, and person. Appear in mild distress, affect appropriate Eyes: PERRLA, Conjunctiva normal ENT: Oral Mucosa Clear, moist  Neck: No JVD, no Abnormal Mass Or lumps Cardiovascular: S1 and S2 Present, no Murmur, peripheral pulses symmetrical Respiratory: good respiratory effort, Bilateral Air entry equal and Decreased, no signs of accessory muscle use, Clear to Auscultation, no Crackles, no wheezes Abdomen: Bowel Sound present, Soft and no tenderness, no hernia Skin: no  rashes  Extremities: no Pedal edema, no calf tenderness Neurologic: without any new focal findings Gait not  checked due to patient safety concerns  Data Reviewed: I have personally reviewed and interpreted labs, imaging as discussed below.  CBC: Recent Labs  Lab 10/10/23 0742  WBC 8.1  NEUTROABS 6.6  HGB 13.5  HCT 44.6  MCV 80.8  PLT 238   Basic Metabolic Panel: Recent Labs  Lab 10/10/23 0725  NA 145  K 3.7  CL 108  CO2 21*  GLUCOSE 146*  BUN 8  CREATININE 0.82  CALCIUM  9.5   GFR: Estimated Creatinine Clearance: 77.8 mL/min (by C-G formula based on SCr of 0.82 mg/dL). Liver Function Tests: Recent Labs  Lab 10/10/23 0725  AST 77*  ALT 18  ALKPHOS 95  BILITOT 0.4  PROT 7.6  ALBUMIN 4.1   No results for input(s): LIPASE, AMYLASE in the last 168 hours. No results for input(s): AMMONIA in the last 168 hours. Coagulation Profile: No results for input(s): INR, PROTIME in the last 168 hours. Cardiac Enzymes: Recent Labs  Lab 10/10/23 0742  CKTOTAL 3,325*   BNP (last 3 results) No results for input(s): PROBNP in the last 8760 hours. HbA1C: No results for input(s): HGBA1C in the last 72 hours. CBG: Recent Labs  Lab 10/10/23 0755  GLUCAP 148*   Lipid Profile: No results for input(s): CHOL, HDL, LDLCALC, TRIG, CHOLHDL, LDLDIRECT in the last 72 hours. Thyroid  Function Tests: No results for input(s): TSH, T4TOTAL, FREET4, T3FREE, THYROIDAB in the last 72 hours. Anemia Panel: No results for input(s): VITAMINB12, FOLATE, FERRITIN, TIBC, IRON, RETICCTPCT in the last 72 hours. Urine analysis:    Component Value Date/Time   COLORURINE YELLOW 10/10/2023 0900   APPEARANCEUR CLOUDY (A) 10/10/2023 0900   LABSPEC 1.018 10/10/2023 0900   PHURINE 5.0 10/10/2023 0900   GLUCOSEU NEGATIVE 10/10/2023 0900   HGBUR MODERATE (A) 10/10/2023 0900   BILIRUBINUR NEGATIVE 10/10/2023 0900   BILIRUBINUR negative 08/20/2017 0845   BILIRUBINUR NEG 11/07/2011 1525   KETONESUR 20 (A) 10/10/2023 0900   PROTEINUR 30 (A) 10/10/2023 0900    UROBILINOGEN 0.2 08/20/2017 0845   NITRITE NEGATIVE 10/10/2023 0900   LEUKOCYTESUR MODERATE (A) 10/10/2023 0900    Radiological Exams on Admission: DG Pelvis 1-2 Views Result Date: 10/10/2023 CLINICAL DATA:  Pain after unwitnessed fall. EXAM: PELVIS - 1-2 VIEW COMPARISON:  July 22, 2023. FINDINGS: There is no evidence of pelvic fracture or diastasis. No pelvic bone lesions are seen. IMPRESSION: No acute abnormality seen. Electronically Signed   By: Lynwood Landy Raddle M.D.   On: 10/10/2023 11:39   DG Shoulder Right Result Date: 10/10/2023 CLINICAL DATA:  Right shoulder pain after unwitnessed fall. EXAM: RIGHT SHOULDER - 2+ VIEW COMPARISON:  None Available. FINDINGS: There is no evidence of fracture or dislocation. There is no evidence of arthropathy or other focal bone abnormality. Soft tissues are unremarkable. IMPRESSION: Negative. Electronically Signed   By: Lynwood Landy Raddle M.D.   On: 10/10/2023 11:38   DG Lumbar Spine Complete Result Date: 10/10/2023 CLINICAL DATA:  Unwitnessed fall. EXAM: LUMBAR SPINE - COMPLETE 4+ VIEW COMPARISON:  None Available. FINDINGS: No fracture or spondylolisthesis is noted. Moderate degenerative disc disease is noted at L3-4. Mild anterior osteophyte formation is noted at L2-3 and L4-5. Large osteophyte is seen laterally to the left at L3-4. IMPRESSION: Multilevel degenerative changes as described above. No acute abnormality seen. Electronically Signed   By: Lynwood Landy Raddle M.D.   On: 10/10/2023 11:37  CT Cervical Spine Wo Contrast Result Date: 10/10/2023 EXAM: CT CERVICAL SPINE WITHOUT CONTRAST 10/10/2023 08:16:18 AM TECHNIQUE: CT of the cervical spine was performed without the administration of intravenous contrast. Multiplanar reformatted images are provided for review. Automated exposure control, iterative reconstruction, and/or weight based adjustment of the mA/kV was utilized to reduce the radiation dose to as low as reasonably achievable. COMPARISON: Cervical spine  series dated 09/05/2021. CLINICAL HISTORY: Neck trauma (Age >= 65y). Had fall @ 3am found on ground by son at 33. C/O of soreness in right shoulder soreness and leg weakness. No LOC but there is dizziness. FINDINGS: CERVICAL SPINE: BONES AND ALIGNMENT: No acute fracture or traumatic malalignment. 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 There is slight degenerative anterolisthesis at C4-C5. DEGENERATIVE CHANGES: There is mild disc space narrowing at C4-C5, C5-C6 and C6-C7. SOFT TISSUES: No prevertebral soft tissue swelling. IMPRESSION: 1. No acute cervical spine abnormality. 2. 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 3. Mild cervical spondylosis with slight degenerative anterolisthesis at C4-5 and mild disc space narrowing at C4-5, C5-6, and C6-7. Electronically signed by: Evalene Coho MD 10/10/2023 08:22 AM EDT RP Workstation: [id number]   CT Head Wo Contrast Result Date: 10/10/2023 EXAM: CT HEAD WITHOUT CONTRAST 10/10/2023 08:16:18 AM TECHNIQUE: CT of the head was performed without the administration of intravenous contrast. Automated exposure control, iterative reconstruction, and/or weight based adjustment of the mA/kV was utilized to reduce the radiation dose to as low as reasonably achievable. COMPARISON: CT of the head dated 07/22/2023. CLINICAL HISTORY: Head trauma, coagulopathy (Age 51-64y). Had fall @ 3am found on ground by son at 75. C/O of soreness in right shoulder soreness and leg weakness. No LOC but there is dizziness. FINDINGS: BRAIN AND VENTRICLES: No acute hemorrhage. No evidence of acute infarct. No hydrocephalus. No extra-axial collection. No mass effect or midline shift. There is a chronic lacunar infarct within the right thalamus. There is a small periventricular white matter disease. There is calcification within the carotid siphons. ORBITS: No acute abnormality. The patient is status post bilateral lens replacement.  SINUSES: No acute abnormality. SOFT TISSUES AND SKULL: No acute soft tissue abnormality. No skull fracture. IMPRESSION: 1. No acute intracranial abnormality. 2. 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 Electronically signed by: Evalene Coho MD 10/10/2023 08:19 AM EDT RP Workstation: [id number]   EKG: Independently reviewed.  Sinus tachycardia, nonischemic.  I reviewed all nursing notes, pharmacy notes, vitals, pertinent old records.  Assessment/Plan Principal Problem:   UTI (urinary tract infection)   # UTI (urinary tract infection) UA positive Continue IV ceftriaxone  Follow urine culture and blood culture  # Urinary retention US  renal: Large postvoid residual urine within the bladder.  270 mL postvoid residual volume Patient will benefit from Foley catheter insertion   # Rhabdomyolysis CK 3325 Hold statin Trend CK level Continue IV fluid  # Lactic acidosis, unknown cause, patient is not septic Lactic acid 4.4 LR 1 L fluid bolus given in the ED, another 1 L LR order placed. Repeat lactic acid level Continue maintenance IV fluid for hydration Monitor vitals and continue telemetry   # Chronic backache with left-sided sciatica Follow MRI lumbar spine Continue as needed pain meds Fall precautions PT and OT eval  # Paroxysmal A-fib, HTN Currently in sinus rhythm Continue monitor on telemetry Resumed Coreg , Cardizem , lisinopril , Eliquis  home dose Monitor BP and titrate medications accordingly   # NIDDM T2 Hold home medications  for now Started NovoLog sliding scale Monitor CBG and continue diabetic diet   # Liver cirrhosis, history of hep C No active issues  # h/o MGUS, patient is not aware.  Recommend to follow with hematology as an outpatient  # Psoriasis, patient is on CellCept  and tacrolimus topical lower extremity psoriasis during flareups. Hold off CellCept  for now due to acute infection.  #  Depression, continued BuSpar , doxepin  and Effexor  Home meds  # Vitamin B12 level 357, goal >400.  Started vitamin B 12 oral supplement   Nutrition: Cardiac and Carb modified diet DVT Prophylaxis: Eliquis   Advance goals of care discussion: Full code   Consults: None  Family Communication: family was present at bedside, at the time of interview.  Opportunity was given to ask question and all questions were answered satisfactorily.  Disposition: Admitted as observation, medical telemetry unit. Likely to be discharged SNF, TBD after PT and OT eval, in 1 to 2 days when stable.   I have discussed plan of care as described above with RN and patient/family.  Severity of Illness: The appropriate patient status for this patient is OBSERVATION. Observation status is judged to be reasonable and necessary in order to provide the required intensity of service to ensure the patient's safety. The patient's presenting symptoms, physical exam findings, and initial radiographic and laboratory data in the context of their medical condition is felt to place them at decreased risk for further clinical deterioration. Furthermore, it is anticipated that the patient will be medically stable for discharge from the hospital within 2 midnights of admission.    Author: ELVAN SOR, MD [facility] 10/10/2023 12:52 PM   To reach On-call, see care teams to locate the attending and reach out to them via [email or web address]. If 7PM-7AM, please contact night-coverage If you still have difficulty reaching the attending provider, please page the [facility] (Director on Call) for [facility] on amion for assistance.

## 2023-10-10 NOTE — ED Notes (Signed)
Bladder scan: 227ml 

## 2023-10-10 NOTE — ED Notes (Signed)
 Assisted pt to bedside toilet, provided call light with instructions to not get up without assistance.

## 2023-10-10 NOTE — Progress Notes (Signed)
   10/10/23 1925  Provider Notification  Provider Name/Title Dr. JONETTA Sor  Date Provider Notified 10/10/23  Time Provider Notified 1926  Method of Notification Page (secure chat)  Notification Reason Critical Result  Test performed and critical result Lactic Acid  Date Critical Result Received 10/10/23  Time Critical Result Received 1915  Provider response Evaluate remotely  Date of Provider Response 10/10/23  Time of Provider Response 1927

## 2023-10-10 NOTE — ED Provider Notes (Signed)
 [place] [facility]GENC[facility] DEPARTMENT AT [facility] Provider Note   CSN: [id number] Arrival date & time: 10/10/23  9287     Patient presents with: Stacy Campbell is a 68 y.o. female.   HPI    68 year old female with a history of type 2 diabetes, cirrhosis, hyperlipidemia, asthma/COPD, hypertension, MGUS, paroxysmal atrial fibrillation on Eliquis , OSA who presents with concern for fall.  Reports that Stacy Campbell has known sciatica/back problems which lead to some weakness in her lower extremities and fall, but over the weekend has had 2 falls after Stacy Campbell had been doing better for longer period of time.  Stacy Campbell was admitted in July after Stacy Campbell had had a similar fall and was found down with elevated CK.  Stacy Campbell reports Stacy Campbell had been doing okay, but this weekend has had more fatigue and weakness with 2 falls.  Stacy Campbell had a fall last night and her brother was home and helped her up and Stacy Campbell was on the floor for about 30 minutes.  Stacy Campbell woke up in the middle of the night again to go to the bathroom, and had another fall, and was laying on her right side, unable to get up until her brother found her about 3 hours later and called the ambulance.  Stacy Campbell denies any focal weakness on one side of the other, no numbness, no change in vision, no headaches, no difficulty talking.  Reports that Stacy Campbell has some chronic lower back pain, and son also reports her tailbone has been hurting since her previous fall, but denies any acute cervical, thoracic or low back pain.  Stacy Campbell reports that her weakness is similar to the way that it has been, but Stacy Campbell has had more falls this weekend.  Denies any known fevers, nausea, vomiting, abdominal pain, chest pain, shortness of breath, cough.  Does report right shoulder pain from her fall.  Stacy Campbell has back pain but is not sure if it is worsened after the fall or not.  Prior to Admission medications   Medication Sig Start Date End Date Taking? Authorizing Provider  albuterol  (VENTOLIN  HFA)  108 (90 Base) MCG/ACT inhaler Inhale 2 puffs into the lungs every 6 (six) hours as needed for wheezing or shortness of breath. 04/05/23   [name], MD  Aspirin -Salicylamide-Caffeine (ARTHRITIS STRENGTH BC POWDER PO) Take 1 packet by mouth daily as needed.    [provider]  busPIRone  (BUSPAR ) 15 MG tablet Take 15 mg by mouth 3 (three) times daily. 05/27/23   [provider]  carvedilol  (COREG ) 25 MG tablet TAKE 1 TABLET(25 MG) BY MOUTH TWICE DAILY WITH A MEAL 12/15/21   [name], MD  cetirizine  (ZYRTEC ) 10 MG tablet Take 10 mg by mouth daily. 09/28/21   [provider]  clobetasol  ointment (TEMOVATE ) 0.05 % Apply 1 Application topically 2 (two) times daily as needed.    [provider]  desvenlafaxine (PRISTIQ) 100 MG 24 hr tablet Take 100 mg by mouth daily. 07/20/23   [provider]  diltiazem  (CARDIZEM  CD) 360 MG 24 hr capsule Take 1 capsule (360 mg total) by mouth daily. 05/14/23   Anner Alm ORN, MD  doxepin  (SINEQUAN ) 25 MG capsule Take 50 mg by mouth 2 (two) times daily. 02/11/23   [provider]  ELIQUIS  5 MG TABS tablet TAKE 1 TABLET(5 MG) BY MOUTH TWICE DAILY 07/11/23   Anner Alm ORN, MD  furosemide  (LASIX ) 20 MG tablet Take 1 tablet (20 mg total) by mouth daily as  needed for edema. Take with potassium 01/16/23   [name], MD  ibuprofen  (ADVIL ) 200 MG tablet Take 200 mg by mouth every 6 (six) hours as needed for mild pain (pain score 1-3).    [provider]  lisinopril  (ZESTRIL ) 10 MG tablet Take 1 tablet (10 mg total) by mouth daily. 09/30/23   [name], MD  meloxicam  (MOBIC ) 15 MG tablet Take 15 mg by mouth daily.    [provider]  metFORMIN  (GLUCOPHAGE -XR) 500 MG 24 hr tablet Take 1 tablet (500 mg total) by mouth daily with breakfast. 07/01/23   [name], MD  montelukast  (SINGULAIR ) 10 MG tablet Take 10 mg by mouth daily.    [provider]  mupirocin  ointment (BACTROBAN ) 2 %  Use twice a day for 7 days as needed for skin infections. 06/27/23   [name], MD  mycophenolate  (CELLCEPT ) 500 MG tablet Take 500 mg by mouth 2 (two) times daily. 02/11/23   [provider]  nystatin  ointment (MYCOSTATIN ) Apply 1 Application topically 2 (two) times daily. 02/11/23 02/11/24  [provider]  oxyCODONE -acetaminophen  (PERCOCET ) 10-325 MG tablet Take 1 tablet by mouth at bedtime. 07/04/21   [provider]  potassium chloride  SA (KLOR-CON  M) 20 MEQ tablet Take 1 tablet (20 mEq total) by mouth daily as needed (leg swelling). Take with furosemide  01/16/23   [name], MD  promethazine  (PHENERGAN ) 12.5 MG tablet Take 12.5 mg by mouth every 12 (twelve) hours as needed for vomiting or nausea. 05/14/23   [provider]  rosuvastatin  (CRESTOR ) 10 MG tablet Take 1 tablet (10 mg total) by mouth at bedtime. TAKE 1 TABLET(10 MG) BY MOUTH DAILY 06/27/23   [name], MD  tacrolimus (PROTOPIC) 0.1 % ointment Apply 1 Application topically 2 (two) times daily. 02/11/23   [provider]  tirzepatide  (MOUNJARO ) 7.5 MG/0.5ML Pen Inject 7.5 mg into the skin once a week. 12/20/22   [name], MD  tiZANidine  (ZANAFLEX ) 4 MG tablet Take 4 mg by mouth 2 (two) times daily. 07/20/23   [provider]    Allergies: Alitraq, Hydrocodone , Linalool, Methylisothiazolinone, Propylene glycol, Aspirin , Egg-derived products, Hydrocodone -guaifenesin , and Pineapple    Review of Systems  Updated Vital Signs BP (!) 159/126   Pulse (!) 119   Temp 98.5 F (36.9 C)   Resp 18   Ht 5' 4 (1.626 m)   Wt 105.8 kg   SpO2 96%   BMI 40.04 kg/m   Physical Exam Vitals and nursing note reviewed.  Constitutional:      General: Stacy Campbell is not in acute distress.    Appearance: Stacy Campbell is well-developed. Stacy Campbell is not diaphoretic.  HENT:     Head: Normocephalic and atraumatic.  Eyes:     Conjunctiva/sclera: Conjunctivae normal.  Cardiovascular:     Rate and Rhythm:  Normal rate and regular rhythm.     Heart sounds: Normal heart sounds. No murmur heard.    No friction rub. No gallop.  Pulmonary:     Effort: Pulmonary effort is normal. No respiratory distress.     Breath sounds: Normal breath sounds. No wheezing or rales.  Abdominal:     General: There is no distension.     Palpations: Abdomen is soft.     Tenderness: There is no abdominal tenderness. There is no guarding.  Musculoskeletal:        General: Tenderness (right shoulder, lower back, sacrum) present.     Cervical back: Normal  range of motion.  Skin:    General: Skin is warm and dry.     Findings: No erythema or rash.  Neurological:     Mental Status: Stacy Campbell is alert and oriented to person, place, and time.     Motor: Weakness: generalized weakness lower extremities with plantar/dorsiflexion, knee extension/flexion, hip flexion 4/5.     (all labs ordered are listed, but only abnormal results are displayed) Labs Reviewed  COMPREHENSIVE METABOLIC PANEL WITH GFR - Abnormal; Notable for the following components:      Result Value   CO2 21 (*)    Glucose, Bld 146 (*)    AST 77 (*)    Anion gap 17 (*)    All other components within normal limits  URINALYSIS, ROUTINE W REFLEX MICROSCOPIC - Abnormal; Notable for the following components:   APPearance CLOUDY (*)    Hgb urine dipstick MODERATE (*)    Ketones, ur 20 (*)    Protein, ur 30 (*)    Leukocytes,Ua MODERATE (*)    Bacteria, UA RARE (*)    All other components within normal limits  CBC WITH DIFFERENTIAL/PLATELET - Abnormal; Notable for the following components:   RBC 5.52 (*)    MCH 24.5 (*)    All other components within normal limits  CK - Abnormal; Notable for the following components:   Total CK 3,325 (*)    All other components within normal limits  CBG MONITORING, ED - Abnormal; Notable for the following components:   Glucose-Capillary 148 (*)    All other components within normal limits  CULTURE, BLOOD (ROUTINE X 2)   CULTURE, BLOOD (ROUTINE X 2)  I-STAT CG4 LACTIC ACID, ED    EKG: EKG Interpretation Date/Time:  Thursday October 10 2023 07:24:19 EDT Ventricular Rate:  122 PR Interval:  167 QRS Duration:  76 QT Interval:  326 QTC Calculation: 465 R Axis:   -10  Text Interpretation: Sinus tachycardia Low voltage, precordial leads Consider anterior infarct Since prior ECG, rate has increased Confirmed by Dreama Longs ([id number]) on 10/10/2023 9:49:29 AM  Radiology: CT Cervical Spine Wo Contrast Result Date: 10/10/2023 EXAM: CT CERVICAL SPINE WITHOUT CONTRAST 10/10/2023 08:16:18 AM TECHNIQUE: CT of the cervical spine was performed without the administration of intravenous contrast. Multiplanar reformatted images are provided for review. Automated exposure control, iterative reconstruction, and/or weight based adjustment of the mA/kV was utilized to reduce the radiation dose to as low as reasonably achievable. COMPARISON: Cervical spine series dated 09/05/2021. CLINICAL HISTORY: Neck trauma (Age >= 65y). Had fall @ 3am found on ground by son at 21. C/O of soreness in right shoulder soreness and leg weakness. No LOC but there is dizziness. FINDINGS: CERVICAL SPINE: BONES AND ALIGNMENT: No acute fracture or traumatic malalignment. 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 There is slight degenerative anterolisthesis at C4-C5. DEGENERATIVE CHANGES: There is mild disc space narrowing at C4-C5, C5-C6 and C6-C7. SOFT TISSUES: No prevertebral soft tissue swelling. IMPRESSION: 1. No acute cervical spine abnormality. 2. 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 3. Mild cervical spondylosis with slight degenerative anterolisthesis at C4-5 and mild disc space narrowing at C4-5, C5-6, and C6-7. Electronically signed by: Evalene Coho MD 10/10/2023 08:22 AM EDT RP Workstation: [id number]   CT Head Wo Contrast Result Date: 10/10/2023 EXAM: CT HEAD WITHOUT CONTRAST  10/10/2023 08:16:18 AM TECHNIQUE: CT of the head was performed without the administration of intravenous contrast. Automated exposure control, iterative reconstruction, and/or weight  based adjustment of the mA/kV was utilized to reduce the radiation dose to as low as reasonably achievable. COMPARISON: CT of the head dated 07/22/2023. CLINICAL HISTORY: Head trauma, coagulopathy (Age 24-64y). Had fall @ 3am found on ground by son at 7. C/O of soreness in right shoulder soreness and leg weakness. No LOC but there is dizziness. FINDINGS: BRAIN AND VENTRICLES: No acute hemorrhage. No evidence of acute infarct. No hydrocephalus. No extra-axial collection. No mass effect or midline shift. There is a chronic lacunar infarct within the right thalamus. There is a small periventricular white matter disease. There is calcification within the carotid siphons. ORBITS: No acute abnormality. The patient is status post bilateral lens replacement. SINUSES: No acute abnormality. SOFT TISSUES AND SKULL: No acute soft tissue abnormality. No skull fracture. IMPRESSION: 1. No acute intracranial abnormality. 2. 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 Electronically signed by: Evalene Coho MD 10/10/2023 08:19 AM EDT RP Workstation: [id number]     Procedures   Medications Ordered in the ED  lactated ringers  bolus 1,000 mL (has no administration in time range)  cefTRIAXone  (ROCEPHIN ) 1 g in sodium chloride  0.9 % 100 mL IVPB (1 g Intravenous New Bag/Given 10/10/23 1046)  acetaminophen  (TYLENOL ) tablet 1,000 mg (1,000 mg Oral Given 10/10/23 1005)                                     68 year old female with a history of type 2 diabetes, cirrhosis, hyperlipidemia, asthma/COPD, hypertension, MGUS, paroxysmal atrial fibrillation on Eliquis , OSA who presents with concern for fall.  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  XR shoulder, pelvis, lumbar spine show no acute fractures. Possible rotator cuff strain vs contusion. Can consider shoulder sling.  Stacy Campbell does not have acute focal weakness or deficits on exam, have low suspicion for acute CVA.  No signs of cauda equina, epidural abscess.  By her history it sounds that Stacy Campbell has had ongoing lower extremity weakness due to spinal pathology and decreased mobility/deconditioning.  Do not suspect GBS. Do not suspect acute surgical pathology. Has not had prior MRI lumbar spine and will order for evaluation given her low back pain and weakness.   Labs completed and interpreted by me show mild anion gap of 17, carbon of 21, CK was 3325, hemoglobin and white blood cell count within normal limits.  Urinalysis is consistent with urinary tract infection.  Feel this may be contributing to her overall generalized weakness and falls as well as tachycardia.  Lactic acid, blood cx ordered.  Given her evidence of UTI, tachycardia, elevated CK, will hydrate, give Rocephin  and admit for further care.       Final diagnoses:  Fall, initial encounter  Elevated CK  Urinary tract infection without hematuria, site unspecified  Acute midline low back pain without sciatica    ED Discharge Orders     None          Dreama Longs, MD 10/10/23 1448

## 2023-10-11 ENCOUNTER — Encounter (HOSPITAL_COMMUNITY): Payer: Self-pay | Admitting: Student

## 2023-10-11 ENCOUNTER — Observation Stay (HOSPITAL_COMMUNITY)

## 2023-10-11 ENCOUNTER — Other Ambulatory Visit: Payer: Self-pay

## 2023-10-11 DIAGNOSIS — M5136 Other intervertebral disc degeneration, lumbar region with discogenic back pain only: Secondary | ICD-10-CM | POA: Diagnosis not present

## 2023-10-11 DIAGNOSIS — Z7901 Long term (current) use of anticoagulants: Secondary | ICD-10-CM | POA: Diagnosis not present

## 2023-10-11 DIAGNOSIS — J4489 Other specified chronic obstructive pulmonary disease: Secondary | ICD-10-CM | POA: Diagnosis present

## 2023-10-11 DIAGNOSIS — Y92009 Unspecified place in unspecified non-institutional (private) residence as the place of occurrence of the external cause: Secondary | ICD-10-CM | POA: Diagnosis not present

## 2023-10-11 DIAGNOSIS — Z6841 Body Mass Index (BMI) 40.0 and over, adult: Secondary | ICD-10-CM | POA: Diagnosis not present

## 2023-10-11 DIAGNOSIS — Z7982 Long term (current) use of aspirin: Secondary | ICD-10-CM | POA: Diagnosis not present

## 2023-10-11 DIAGNOSIS — M47816 Spondylosis without myelopathy or radiculopathy, lumbar region: Secondary | ICD-10-CM | POA: Diagnosis not present

## 2023-10-11 DIAGNOSIS — M47817 Spondylosis without myelopathy or radiculopathy, lumbosacral region: Secondary | ICD-10-CM | POA: Diagnosis not present

## 2023-10-11 DIAGNOSIS — I480 Paroxysmal atrial fibrillation: Secondary | ICD-10-CM | POA: Diagnosis present

## 2023-10-11 DIAGNOSIS — R531 Weakness: Secondary | ICD-10-CM | POA: Diagnosis present

## 2023-10-11 DIAGNOSIS — E876 Hypokalemia: Secondary | ICD-10-CM | POA: Diagnosis present

## 2023-10-11 DIAGNOSIS — I10 Essential (primary) hypertension: Secondary | ICD-10-CM | POA: Diagnosis present

## 2023-10-11 DIAGNOSIS — W19XXXA Unspecified fall, initial encounter: Secondary | ICD-10-CM | POA: Diagnosis present

## 2023-10-11 DIAGNOSIS — M48061 Spinal stenosis, lumbar region without neurogenic claudication: Secondary | ICD-10-CM | POA: Diagnosis present

## 2023-10-11 DIAGNOSIS — K219 Gastro-esophageal reflux disease without esophagitis: Secondary | ICD-10-CM | POA: Diagnosis present

## 2023-10-11 DIAGNOSIS — Z7985 Long-term (current) use of injectable non-insulin antidiabetic drugs: Secondary | ICD-10-CM | POA: Diagnosis not present

## 2023-10-11 DIAGNOSIS — Z9181 History of falling: Secondary | ICD-10-CM | POA: Diagnosis not present

## 2023-10-11 DIAGNOSIS — Z87891 Personal history of nicotine dependence: Secondary | ICD-10-CM | POA: Diagnosis not present

## 2023-10-11 DIAGNOSIS — Z7984 Long term (current) use of oral hypoglycemic drugs: Secondary | ICD-10-CM | POA: Diagnosis not present

## 2023-10-11 DIAGNOSIS — D472 Monoclonal gammopathy: Secondary | ICD-10-CM | POA: Diagnosis present

## 2023-10-11 DIAGNOSIS — E119 Type 2 diabetes mellitus without complications: Secondary | ICD-10-CM | POA: Diagnosis present

## 2023-10-11 DIAGNOSIS — E669 Obesity, unspecified: Secondary | ICD-10-CM | POA: Diagnosis present

## 2023-10-11 DIAGNOSIS — K7460 Unspecified cirrhosis of liver: Secondary | ICD-10-CM | POA: Diagnosis present

## 2023-10-11 DIAGNOSIS — M6282 Rhabdomyolysis: Secondary | ICD-10-CM | POA: Diagnosis present

## 2023-10-11 DIAGNOSIS — E872 Acidosis, unspecified: Secondary | ICD-10-CM | POA: Diagnosis present

## 2023-10-11 DIAGNOSIS — M5432 Sciatica, left side: Secondary | ICD-10-CM | POA: Diagnosis present

## 2023-10-11 DIAGNOSIS — N390 Urinary tract infection, site not specified: Secondary | ICD-10-CM | POA: Diagnosis present

## 2023-10-11 DIAGNOSIS — F32A Depression, unspecified: Secondary | ICD-10-CM | POA: Diagnosis present

## 2023-10-11 DIAGNOSIS — N3000 Acute cystitis without hematuria: Secondary | ICD-10-CM | POA: Diagnosis not present

## 2023-10-11 DIAGNOSIS — E785 Hyperlipidemia, unspecified: Secondary | ICD-10-CM | POA: Diagnosis present

## 2023-10-11 LAB — BASIC METABOLIC PANEL WITH GFR
Anion gap: 11 (ref 5–15)
Anion gap: 11 (ref 5–15)
BUN: 7 mg/dL — ABNORMAL LOW (ref 8–23)
BUN: 7 mg/dL — ABNORMAL LOW (ref 8–23)
CO2: 23 mmol/L (ref 22–32)
CO2: 23 mmol/L (ref 22–32)
Calcium: 7.9 mg/dL — ABNORMAL LOW (ref 8.9–10.3)
Calcium: 8 mg/dL — ABNORMAL LOW (ref 8.9–10.3)
Chloride: 111 mmol/L (ref 98–111)
Chloride: 112 mmol/L — ABNORMAL HIGH (ref 98–111)
Creatinine, Ser: 0.81 mg/dL (ref 0.44–1.00)
Creatinine, Ser: 0.73 mg/dL (ref 0.44–1.00)
GFR, Estimated: >60 mL/min (ref >60)
GFR, Estimated: >60 mL/min (ref >60)
Glucose, Bld: 120 mg/dL — ABNORMAL HIGH (ref 70–99)
Glucose, Bld: 117 mg/dL — ABNORMAL HIGH (ref 70–99)
Potassium: 2.8 mmol/L — ABNORMAL LOW (ref 3.5–5.1)
Potassium: 3.1 mmol/L — ABNORMAL LOW (ref 3.5–5.1)
Sodium: 144 mmol/L (ref 135–145)
Sodium: 146 mmol/L — ABNORMAL HIGH (ref 135–145)

## 2023-10-11 LAB — CK: Total CK: 3679 U/L — ABNORMAL HIGH (ref 38–234)

## 2023-10-11 LAB — CBC
HCT: 33.6 % — ABNORMAL LOW (ref 36.0–46.0)
Hemoglobin: 10.5 g/dL — ABNORMAL LOW (ref 12.0–15.0)
MCH: 25.1 pg — ABNORMAL LOW (ref 26.0–34.0)
MCHC: 31.3 g/dL (ref 30.0–36.0)
MCV: 80.2 fL (ref 80.0–100.0)
Platelets: 185 K/uL (ref 150–400)
RBC: 4.19 MIL/uL (ref 3.87–5.11)
RDW: 14.3 % (ref 11.5–15.5)
WBC: 5.2 K/uL (ref 4.0–10.5)
nRBC: 0 % (ref 0.0–0.2)

## 2023-10-11 LAB — HEPATIC FUNCTION PANEL
ALT: 13 U/L (ref 0–44)
AST: 82 U/L — ABNORMAL HIGH (ref 15–41)
Albumin: 3.2 g/dL — ABNORMAL LOW (ref 3.5–5.0)
Alkaline Phosphatase: 65 U/L (ref 38–126)
Bilirubin, Direct: 0.2 mg/dL (ref 0.0–0.2)
Indirect Bilirubin: 0.1 mg/dL — ABNORMAL LOW (ref 0.3–0.9)
Total Bilirubin: 0.2 mg/dL (ref 0.0–1.2)
Total Protein: 5.5 g/dL — ABNORMAL LOW (ref 6.5–8.1)

## 2023-10-11 LAB — GLUCOSE, CAPILLARY
Glucose-Capillary: 127 mg/dL — ABNORMAL HIGH (ref 70–99)
Glucose-Capillary: 173 mg/dL — ABNORMAL HIGH (ref 70–99)
Glucose-Capillary: 87 mg/dL (ref 70–99)
Glucose-Capillary: 140 mg/dL — ABNORMAL HIGH (ref 70–99)

## 2023-10-11 LAB — URINE CULTURE: Culture: NO GROWTH

## 2023-10-11 LAB — FOLATE: Folate: 6.1 ng/mL (ref >5.9)

## 2023-10-11 LAB — MAGNESIUM: Magnesium: 1.5 mg/dL — ABNORMAL LOW (ref 1.7–2.4)

## 2023-10-11 LAB — PHOSPHORUS: Phosphorus: 2.7 mg/dL (ref 2.5–4.6)

## 2023-10-11 LAB — LACTIC ACID, PLASMA: Lactic Acid, Venous: 1.3 mmol/L (ref 0.5–1.9)

## 2023-10-11 MED ORDER — DILTIAZEM HCL ER COATED BEADS 240 MG PO CP24
360.0000 mg | ORAL_CAPSULE | Freq: Every day | ORAL | Status: DC
Start: 2023-10-12 — End: 2023-10-11

## 2023-10-11 MED ORDER — POTASSIUM CHLORIDE CRYS ER 20 MEQ PO TBCR
40.0000 meq | EXTENDED_RELEASE_TABLET | Freq: Once | ORAL | Status: AC
  Administered 2023-10-11: 40 meq via ORAL
  Filled 2023-10-11: qty 2

## 2023-10-11 MED ORDER — MAGNESIUM SULFATE 4 GM/100ML IV SOLN
4.0000 g | Freq: Once | INTRAVENOUS | Status: AC
  Administered 2023-10-11: 4 g via INTRAVENOUS
  Filled 2023-10-11: qty 100

## 2023-10-11 MED ORDER — FOLIC ACID 1 MG PO TABS
1.0000 mg | ORAL_TABLET | Freq: Every day | ORAL | Status: DC
  Administered 2023-10-11 – 2023-10-15 (×5): 1 mg via ORAL
  Filled 2023-10-11 (×5): qty 1

## 2023-10-11 MED ORDER — DILTIAZEM HCL ER COATED BEADS 120 MG PO CP24
120.0000 mg | ORAL_CAPSULE | Freq: Every day | ORAL | Status: DC
Start: 2023-10-11 — End: 2023-10-11
  Administered 2023-10-11: 120 mg via ORAL
  Filled 2023-10-11: qty 1

## 2023-10-11 MED ORDER — POTASSIUM CHLORIDE 10 MEQ/100ML IV SOLN
10.0000 meq | INTRAVENOUS | Status: AC
  Administered 2023-10-11 (×2): 10 meq via INTRAVENOUS
  Filled 2023-10-11 (×2): qty 100

## 2023-10-11 MED ORDER — SODIUM CHLORIDE 0.9 % IV SOLN: INTRAVENOUS | Status: AC

## 2023-10-11 MED ORDER — ACETAMINOPHEN 325 MG PO TABS
650.0000 mg | ORAL_TABLET | Freq: Three times a day (TID) | ORAL | Status: DC
  Administered 2023-10-11 – 2023-10-15 (×10): 650 mg via ORAL
  Filled 2023-10-11 (×10): qty 2

## 2023-10-11 MED ORDER — CARVEDILOL 6.25 MG PO TABS
6.2500 mg | ORAL_TABLET | Freq: Two times a day (BID) | ORAL | Status: DC
Start: 2023-10-11 — End: 2023-10-11
  Administered 2023-10-11: 6.25 mg via ORAL
  Filled 2023-10-11: qty 1

## 2023-10-11 NOTE — Evaluation (Signed)
 Physical Therapy Evaluation Patient Details Name: Stacy Campbell MRN: [id number] DOB: 03-08-1955 Today's Date: 10/11/2023  History of Present Illness  Patient presenting to the ED status post fall on 10/10/23. Imaging negative for acute changes, admitted with UTI and rhabdomyolysis. MRI Lumbar: severe degenerative disc disease at L3-4. PMH includes: type 2 diabetes, cirrhosis, hyperlipidemia, asthma/COPD, hypertension, MGUS, paroxysmal atrial fibrillation on Eliquis , OSA  Clinical Impression  Pt admitted with above diagnosis. At baseline, pt independent and ambulatory without AD.  Pt has intermittent assistance from brother and grandchild (31 yo).  Today, pt with generalized soreness and chronic sciatica that limited her mobility.  She did need mod A for bed mobility but then able to ambulate 41' with RW and CGA. Pt expected to progress well with therapy.  Recommend HHPT.  Encouraged ambulation with nursing and mobility.  Pt currently with functional limitations due to the deficits listed below (see PT Problem List). Pt will benefit from acute skilled PT to increase their independence and safety with mobility to allow discharge.           If plan is discharge home, recommend the following: A little help with walking and/or transfers;A little help with bathing/dressing/bathroom;Assistance with cooking/housework;Help with stairs or ramp for entrance   Can travel by private vehicle        Equipment Recommendations None recommended by PT  Recommendations for Other Services       Functional Status Assessment Patient has had a recent decline in their functional status and demonstrates the ability to make significant improvements in function in a reasonable and predictable amount of time.     Precautions / Restrictions Precautions Precautions: Fall      Mobility  Bed Mobility Overal bed mobility: Needs Assistance Bed Mobility: Supine to Sit     Supine to sit: Mod assist, HOB  elevated, Used rails     General bed mobility comments: Increased time, guarding hand with IV, mod A to scoot forward    Transfers Overall transfer level: Needs assistance Equipment used: Rolling walker (2 wheels) Transfers: Sit to/from Stand Sit to Stand: Contact guard assist           General transfer comment: Increased time but no assist    Ambulation/Gait Ambulation/Gait assistance: Contact guard assist Gait Distance (Feet): 60 Feet Assistive device: Rolling walker (2 wheels) Gait Pattern/deviations: Step-to pattern, Decreased stride length, Antalgic Gait velocity: decreased     General Gait Details: Took at least 4 standing rest breaks, min cues for RW use  Stairs            Wheelchair Mobility     Tilt Bed    Modified Rankin (Stroke Patients Only)       Balance Overall balance assessment: Needs assistance Sitting-balance support: No upper extremity supported Sitting balance-Leahy Scale: Good Sitting balance - Comments: did not challenge   Standing balance support: Bilateral upper extremity supported, Reliant on assistive device for balance Standing balance-Leahy Scale: Poor Standing balance comment: Used RW , did not attempt without                             Pertinent Vitals/Pain Pain Assessment Pain Assessment: Faces Faces Pain Scale: Hurts little more Pain Location: L sciatica chronic, and generalized soreness Pain Descriptors / Indicators: Grimacing, Guarding, Discomfort Pain Intervention(s): Limited activity within patient's tolerance, Monitored during session, Premedicated before session, Repositioned    Home Living Family/patient expects to be discharged to:: Private  residence Living Arrangements: Other relatives (brother and grandchild) Available Help at Discharge: Family;Available PRN/intermittently Type of Home: House Home Access: Stairs to enter Entrance Stairs-Rails: Doctor, general practice of Steps:  5   Home Layout: One level Home Equipment: Pharmacist, hospital (2 wheels)      Prior Function Prior Level of Function : Independent/Modified Independent;History of Falls (last six months)             Mobility Comments: independent without use of AD; reports could ambulate in community sometimes depending on back/sciatic pain ADLs Comments: independent adls and iadls     Extremity/Trunk Assessment   Upper Extremity Assessment Upper Extremity Assessment: Defer to OT evaluation    Lower Extremity Assessment Lower Extremity Assessment: LLE deficits/detail;RLE deficits/detail RLE Deficits / Details: ROM WFL; MMT: 4/5 hip, 5/5 knee ext, 5/5 DF LLE Deficits / Details: ROM WFL; MMT: 4/5 hip, 5/5 knee ext, 5/5 DF    Cervical / Trunk Assessment Cervical / Trunk Assessment: Kyphotic Cervical / Trunk Exceptions: increased body habitus  Communication   Communication Communication: No apparent difficulties    Cognition Arousal: Alert Behavior During Therapy: Flat affect   PT - Cognitive impairments: No apparent impairments                         Following commands: Intact       Cueing       General Comments General comments (skin integrity, edema, etc.): HR up to 102 bpm with walking    Exercises     Assessment/Plan    PT Assessment Patient needs continued PT services  PT Problem List Decreased strength;Decreased mobility;Decreased knowledge of precautions;Decreased activity tolerance;Decreased balance;Decreased knowledge of use of DME;Pain       PT Treatment Interventions DME instruction;Therapeutic exercise;Gait training;Balance training;Stair training;Neuromuscular re-education;Modalities;Functional mobility training;Therapeutic activities;Patient/family education    PT Goals (Current goals can be found in the Care Plan section)  Acute Rehab PT Goals Patient Stated Goal: return home PT Goal Formulation: With patient Time For Goal Achievement:  10/25/23 Potential to Achieve Goals: Good    Frequency Min 3X/week     Co-evaluation               AM-PAC PT 6 Clicks Mobility  Outcome Measure Help needed turning from your back to your side while in a flat bed without using bedrails?: A Little Help needed moving from lying on your back to sitting on the side of a flat bed without using bedrails?: A Lot Help needed moving to and from a bed to a chair (including a wheelchair)?: A Little Help needed standing up from a chair using your arms (e.g., wheelchair or bedside chair)?: A Little Help needed to walk in hospital room?: A Little Help needed climbing 3-5 steps with a railing? : A Little 6 Click Score: 17    End of Session Equipment Utilized During Treatment: Gait belt Activity Tolerance: Patient limited by pain Patient left: in chair;with chair alarm set;with call bell/phone within reach Nurse Communication: Mobility status PT Visit Diagnosis: Other abnormalities of gait and mobility (R26.89);Muscle weakness (generalized) (M62.81);History of falling (Z91.81)    Time: 1441-1501 PT Time Calculation (min) (ACUTE ONLY): 20 min   Charges:   PT Evaluation $PT Eval Low Complexity: 1 Low   PT General Charges $$ ACUTE PT VISIT: 1 Visit         Benjiman, PT Acute Rehab [facility] [phone]   Benjiman VEAR [place] 10/11/2023, 3:17 PM

## 2023-10-11 NOTE — Progress Notes (Signed)
 10/10/23 2312 Patient repeat lactic acid 3.0  Notified MD on call, Abigail via secure chat message. New orders received.

## 2023-10-11 NOTE — Progress Notes (Signed)
 Patients blood pressure is 95/59 at this time. Provider made aware.

## 2023-10-11 NOTE — Evaluation (Signed)
 Occupational Therapy Evaluation Patient Details Name: Stacy Campbell MRN: [id number] DOB: 07/03/1955 Today's Date: 10/11/2023   History of Present Illness   Patient presenting to the ED status post fall on 10/10/23. Imaging negative, admitted with UTI. MRI pending. PMH includes: type 2 diabetes, cirrhosis, hyperlipidemia, asthma/COPD, hypertension, MGUS, paroxysmal atrial fibrillation on Eliquis , OSA     Clinical Impressions Prior to this admission, patient living with family, who is available intermittently. Patient takes care of her grandchildren at baseline. Patient endorses falls and decreased balance. Currently, patient with R shoulder pain, and RLE pain, and need for increased time for bed mobility. Patient with generalized weakness and decreased activity tolerance. Patient min A for ADLs and transfers, but returning back to bed due to pending MRI. Patient would benefit from [facility] at discharge, OT will continue to follow.      If plan is discharge home, recommend the following:   A little help with walking and/or transfers;A little help with bathing/dressing/bathroom;Assistance with cooking/housework;Assist for transportation;Help with stairs or ramp for entrance     Functional Status Assessment   Patient has had a recent decline in their functional status and demonstrates the ability to make significant improvements in function in a reasonable and predictable amount of time.     Equipment Recommendations   None recommended by OT     Recommendations for Other Services         Precautions/Restrictions   Precautions Precautions: Fall Recall of Precautions/Restrictions: Intact Restrictions Weight Bearing Restrictions Per Provider Order: No     Mobility Bed Mobility Overal bed mobility: Needs Assistance Bed Mobility: Supine to Sit, Sit to Supine     Supine to sit: Contact guard Sit to supine: Min assist   General bed mobility comments: CGA to come to  EOB, significant increased time due to pain, educated on log roll tecnhique to decrease pain from R sciatica, min A to return BLEs back into bed    Transfers Overall transfer level: Needs assistance Equipment used: 1 person hand held assist Transfers: Sit to/from Stand Sit to Stand: Min assist           General transfer comment: Min A to come up into standing, able to take a few steps to [facility], but effortful with movement      Balance Overall balance assessment: Needs assistance Sitting-balance support: Bilateral upper extremity supported, Feet supported Sitting balance-Leahy Scale: Fair Sitting balance - Comments: did not challenge   Standing balance support: Bilateral upper extremity supported, During functional activity, Reliant on assistive device for balance Standing balance-Leahy Scale: Poor Standing balance comment: reliant on external assist                           ADL either performed or assessed with clinical judgement   ADL Overall ADL's : Needs assistance/impaired Eating/Feeding: Set up;Sitting   Grooming: Wash/dry hands;Wash/dry face;Set up;Sitting   Upper Body Bathing: Contact guard assist;Sitting   Lower Body Bathing: Minimal assistance;Moderate assistance;Sit to/from stand;Sitting/lateral leans   Upper Body Dressing : Contact guard assist;Sitting   Lower Body Dressing: Minimal assistance;Moderate assistance;Sit to/from stand;Sitting/lateral leans   Toilet Transfer: Minimal assistance;Ambulation;BSC/3in1 Statistician Details (indicate cue type and reason): simulated Toileting- Clothing Manipulation and Hygiene: Contact guard assist;Sit to/from stand;Sitting/lateral lean       Functional mobility during ADLs: Minimal assistance;Cueing for safety;Cueing for sequencing;Rolling walker (2 wheels) General ADL Comments: Prior to this admission, patient living with family, who is available intermittently. Patient  takes care of her grandchildren at  baseline. Patient endorses falls and decreased balance. Currently, patient with R shoulder pain, and RLE pain, and need for increased time for bed mobility. Patient with generalized weakness and decreased activity tolerance. Patient min A for ADLs and transfers, but returning back to bed due to pending MRI. Patient would benefit from [facility] at discharge, OT will continue to follow.     Vision Baseline Vision/History: 1 Wears glasses Ability to See in Adequate Light: 0 Adequate Patient Visual Report: No change from baseline Vision Assessment?: Wears glasses for reading Additional Comments: recent cataract surgery     Perception Perception: Not tested       Praxis Praxis: Not tested       Pertinent Vitals/Pain Pain Assessment Pain Assessment: Faces Faces Pain Scale: Hurts whole lot Pain Location: RLE Pain Descriptors / Indicators: Grimacing, Guarding, Discomfort Pain Intervention(s): Limited activity within patient's tolerance, Monitored during session, Premedicated before session, Repositioned     Extremity/Trunk Assessment Upper Extremity Assessment Upper Extremity Assessment: RUE deficits/detail RUE Deficits / Details: R shuolder soreness, decreased shoulder flexion, and strength when tested 3+/5, sensation intact RUE: Shoulder pain with ROM;Shoulder pain at rest RUE Sensation: WNL RUE Coordination: decreased gross motor   Lower Extremity Assessment Lower Extremity Assessment: Defer to PT evaluation   Cervical / Trunk Assessment Cervical / Trunk Assessment: Other exceptions;Kyphotic Cervical / Trunk Exceptions: increased body habitus   Communication Communication Communication: No apparent difficulties   Cognition Arousal: Alert Behavior During Therapy: WFL for tasks assessed/performed Cognition: No apparent impairments                               Following commands: Intact       Cueing  General Comments   Cueing Techniques: Verbal cues  HR at  89 during session on RA   Exercises     Shoulder Instructions      Home Living Family/patient expects to be discharged to:: Private residence Living Arrangements: Other relatives Available Help at Discharge: Family;Available PRN/intermittently Type of Home: House Home Access: Stairs to enter [employer] of Steps: 5 Entrance Stairs-Rails: Right;Left Home Layout: One level     Bathroom Shower/Tub: Producer, television/film/video: Standard     Home Equipment: Pharmacist, hospital (2 wheels)          Prior Functioning/Environment Prior Level of Function : Independent/Modified Independent;History of Falls (last six months)             Mobility Comments: independent without use of AD ADLs Comments: independent    OT Problem List: Decreased strength;Decreased range of motion;Decreased activity tolerance;Decreased coordination;Decreased safety awareness;Decreased knowledge of use of DME or AE;Decreased knowledge of precautions;Impaired UE functional use;Pain   OT Treatment/Interventions: Self-care/ADL training;Therapeutic exercise;Energy conservation;DME and/or AE instruction;Manual therapy;Therapeutic activities;Patient/family education;Balance training      OT Goals(Current goals can be found in the care plan section)   Acute Rehab OT Goals Patient Stated Goal: to get back to my normal OT Goal Formulation: With patient Time For Goal Achievement: 10/25/23 Potential to Achieve Goals: Good ADL Goals Pt Will Perform Lower Body Bathing: with modified independence;sitting/lateral leans;sit to/from stand Pt Will Perform Lower Body Dressing: with modified independence;sit to/from stand;sitting/lateral leans Pt Will Transfer to Toilet: with modified independence;ambulating;regular height toilet Pt Will Perform Toileting - Clothing Manipulation and hygiene: with modified independence;sitting/lateral leans;sit to/from stand Additional ADL Goal #1: Patient  will be able to complete  functional task in standing for 3-5 minutes prior to needing seated rest break in order to increase overall activity tolerance.   OT Frequency:  Min 2X/week    Co-evaluation              AM-PAC OT 6 Clicks Daily Activity     Outcome Measure Help from another person eating meals?: A Little Help from another person taking care of personal grooming?: A Little Help from another person toileting, which includes using toliet, bedpan, or urinal?: A Little Help from another person bathing (including washing, rinsing, drying)?: A Little Help from another person to put on and taking off regular upper body clothing?: A Little Help from another person to put on and taking off regular lower body clothing?: A Little 6 Click Score: 18   End of Session Nurse Communication: Mobility status  Activity Tolerance: Patient tolerated treatment well Patient left: in bed;with call bell/phone within reach;with bed alarm set  OT Visit Diagnosis: Unsteadiness on feet (R26.81);Other abnormalities of gait and mobility (R26.89);Repeated falls (R29.6);History of falling (Z91.81);Muscle weakness (generalized) (M62.81);Pain Pain - Right/Left: Right Pain - part of body: Leg;Shoulder                Time: 9148-9090 OT Time Calculation (min): 18 min Charges:  OT General Charges $OT Visit: 1 Visit OT Evaluation $OT Eval Moderate Complexity: 1 Mod  Ronal Gift E. Leomia Blake, OTR/L Acute Rehabilitation Services [phone]   Ronal Gift Salt 10/11/2023, 10:34 AM

## 2023-10-11 NOTE — Progress Notes (Signed)
 [facility] Progress Note  Patient: Stacy Campbell    FMW:[id number]  DOA: 10/10/2023     Date of Service: the patient was seen and examined on 10/11/2023  Chief Complaint  Patient presents with   Fall   Brief hospital course: Stacy Campbell is a 68 y.o. 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 As per patient she has been having multiple falls, she had a fall yesterday and her brother helped her to get up and she woke up in the middle of the night and had another fall so she laid on the floor for 3 hours until her brother found her in the morning and called ambulance.  Patient is just feeling weakness while walking and her legs gave away.  Denied any LOC, no head trauma.     ED Course: VS afebrile HR 126, RR 14, BP 158/124, 97% on RA Lactic acidosis lactic acid 4.4 BMP: Glucose 146, bicarb 21, anion gap 17, AST 77, rest within normal range Rhabdomyolysis, CK 3325 UA positive Follow blood culture and urine culture CBC within normal range, no leukocytosis.   CT head negative for any acute findings CT C-spine: Chronic DJD, no acute findings, no fracture X-ray right shoulder and x-ray pelvis negative for any fracture Lumbar x-ray negative for any fracture Follow MRI lumbar spine   Assessment and Plan:    # UTI (urinary tract infection) UA positive Continue IV ceftriaxone  Blood culture NGTD Follow urine culture   # Urinary retention US  renal: Large postvoid residual urine within the bladder.  270 mL postvoid residual volume Patient refused Foley catheter insertion, stated that she is voiding well  10/3 follow repeat postvoid bladder scan.  # Rhabdomyolysis CK 3325> 3679 Hold  statin Trend CK level Continue IV fluid  # Hypokalemia, potassium repleted. # Hypomagnesemia, mag repleted. Check electrolytes daily and replete as needed.   # Lactic acidosis, unknown cause, patient is not septic Lactic acid 4.4>1.3 resolved after IV fluid LR 1 L fluid bolus given in the ED, another 1 L LR order placed. Continue maintenance IV fluid for hydration Monitor vitals and continue telemetry     # Chronic backache with left-sided sciatica MRI lumbar spine: Severe DDD L3-4 progressed since prior study.  Mild spinal stenosis with disc bulging and mild facet arthropathy.  Moderate facet arthropathy at L4-5 worsened since prior study, but at L5-S1 similar to prior study.  Small disc herniation and contact with right S1 nerve root is unchanged. -Continue as needed pain meds -Fall precautions -PT and OT eval    # Paroxysmal A-fib, HTN Currently in sinus rhythm Continue monitor on telemetry Resumed Coreg , Cardizem , lisinopril , Eliquis  home dose Monitor BP and titrate medications accordingly 10/3 BP dropped today, so discontinued Cardizem , Coreg  and lisinopril  Monitor BP and resume medications accordingly  # NIDDM T2 Hold home medications for now Started NovoLog sliding scale Monitor CBG and continue diabetic diet     # Liver cirrhosis, history of hep C No active issues   # h/o MGUS, patient is not aware.  Recommend to follow with hematology as an outpatient   # Psoriasis, patient is on CellCept  and tacrolimus topical lower extremity psoriasis during flareups. Hold off CellCept  for now due to acute infection.   #  Depression, continued BuSpar , doxepin  and Effexor  Home meds   # Vitamin B12 level 357, goal >400.  Started vitamin B 12 oral supplement # Folic acid level 6.1, at lower end.  Started folic acid 1 mg p.o. daily.   Body mass index is 40.04 kg/m.  Interventions:  Diet: Heart healthy carb modified diet DVT Prophylaxis: Eliquis   Advance goals of care  discussion: Full code  Family Communication: family was present at bedside, at the time of interview.  The pt provided permission to discuss medical plan with the family. Opportunity was given to ask question and all questions were answered satisfactorily.   Disposition:  Pt is from Home, admitted with fall, lactic acidosis, rhabdomyolysis and UTI, chronic back pain with left sciatica., still has elevated CK level and backache, which precludes a safe discharge. Discharge to home health versus SNF, TBD after PT/OT eval, when stable, may need few days to improve..  Subjective: No significant events overnight.  Patient is complaining of pain in her back, shoulder and left leg 9/10, denied any other complaints.   Physical Exam: General: NAD, lying comfortably Appear in no distress, affect appropriate Eyes: PERRLA ENT: Oral Mucosa Clear, moist  Neck: no JVD,  Cardiovascular: S1 and S2 Present, no Murmur,  Respiratory: good respiratory effort, Bilateral Air entry equal and Decreased, no Crackles, no wheezes Abdomen: Bowel Sound present, Soft and no tenderness,  Skin: no rashes Extremities: no Pedal edema, no calf tenderness Neurologic: without any new focal findings Gait not checked due to patient safety concerns  Vitals:   10/11/23 0305 10/11/23 0538 10/11/23 0932 10/11/23 1343  BP: (!) 95/59 (!) 117/59 (!) 125/56 (!) 90/53  Pulse:  83 80 71  Resp:  16 18 18   Temp:  98.2 F (36.8 C) 98 F (36.7 C) 97.9 F (36.6 C)  TempSrc:  Oral Oral Oral  SpO2:  97% 98% 97%  Weight:      Height:        Intake/Output Summary (Last 24 hours) at 10/11/2023 1440 Last data filed at 10/11/2023 1400 Gross per 24 hour  Intake 2198.12 ml  Output 1350 ml  Net 848.12 ml   Filed Weights   10/10/23 0720  Weight: 105.8 kg    Data Reviewed: I have personally reviewed and interpreted daily labs, tele strips, imagings as discussed above. I reviewed all nursing notes, pharmacy notes, vitals, pertinent  old records I have discussed plan of care as described above with RN and patient/family.  CBC: Recent Labs  Lab 10/10/23 0742 10/11/23 0418  WBC 8.1 5.2  NEUTROABS 6.6  --   HGB 13.5 10.5*  HCT 44.6 33.6*  MCV 80.8 80.2  PLT 238 185   Basic Metabolic Panel: Recent Labs  Lab 10/10/23 0725 10/11/23 0418 10/11/23 0820  NA 145 144 146*  K 3.7 2.8* 3.1*  CL 108 111 112*  CO2 21* 23 23  GLUCOSE 146* 120* 117*  BUN 8 7* 7*  CREATININE 0.82 0.81 0.73  CALCIUM  9.5 7.9* 8.0*  MG  --  1.5*  --   PHOS  --  2.7  --     Studies: MR LUMBAR SPINE WO CONTRAST Result Date: 10/11/2023 CLINICAL DATA:  Low back pain, progressive lower extremity weakness EXAM: MRI LUMBAR SPINE WITHOUT CONTRAST TECHNIQUE: Multiplanar, multisequence MR imaging of the lumbar spine was performed. No intravenous contrast was administered. COMPARISON:  December 07, 2015 FINDINGS: Bone marrow: There are Modic type 2 changes at L3-4 Conus and cauda equina: No significant  abnormality Paraspinal tissues: No significant abnormality L1-L2: The disc is normal.  Mild facet arthropathy L2-L3: The disc is normal.  Mild facet arthropathy L3-L4: There is severe degenerative disc disease with a broad-based disc bulge. There is mild facet enlargement. There is mild spinal stenosis. Moderate left neural foraminal stenosis due to lateral bulging of the disc. L4-L5: There is a mild disc bulge. There is moderate facet arthropathy. There is no spinal stenosis or foraminal stenosis L5-S1: There is mild degenerative disc disease with a mild disc bulge. There is moderate facet arthropathy. There is no spinal stenosis or significant foraminal stenosis. Small right paracentral disc herniation in contact with the right S1 nerve root. IMPRESSION: 1. There is severe degenerative disc disease at L3-4 which has progressed since the prior study. There is mild spinal stenosis due to the disc bulge and mild facet arthropathy 2. There is moderate facet  arthropathy at L4-5 which has worsened since the prior study 3. There is moderate facet arthropathy at L5-S1 which is similar to the prior study. A small right paracentral disc herniation at L5-S1 and contact with the right S1 nerve root is unchanged compared with the prior study. Electronically Signed   By: Nancyann Burns M.D.   On: 10/11/2023 11:58   US  RENAL Result Date: 10/10/2023 CLINICAL DATA:  UTI EXAM: RENAL / URINARY TRACT ULTRASOUND COMPLETE COMPARISON:  Ultrasound abdomen April 18, 2023 FINDINGS: Right Kidney: Renal measurements: 10x4.9 x 5 cm = volume: 128 mL. Echogenicity within normal limits. No mass or hydronephrosis visualized. Left Kidney: Renal measurements: 10.3 x 4.6 x 4.6 cm = volume: 111 mL. Echogenicity within normal limits. No mass or hydronephrosis visualized. Bladder: Grossly appears normal for degree of bladder distention with normal wall thickness. No intraluminal stone or mass lesion identified. Prevoid bladder volume measures 270 mL and on postvoid there is 121 mL remaining. Other: None. IMPRESSION: Large postvoid residual urine within the bladder. Electronically Signed   By: Megan  Zare M.D.   On: 10/10/2023 17:53    Scheduled Meds:  acetaminophen   650 mg Oral TID   apixaban   5 mg Oral BID   busPIRone   15 mg Oral TID   carvedilol   6.25 mg Oral BID WC   Chlorhexidine  Gluconate Cloth  6 each Topical Daily   vitamin B-12  1,000 mcg Oral Daily   diltiazem   120 mg Oral Daily   doxepin   50 mg Oral BID   folic acid  1 mg Oral Daily   insulin aspart  0-20 Units Subcutaneous TID WC   lisinopril   10 mg Oral Daily   sodium chloride  flush  3 mL Intravenous Q12H   sodium chloride  flush  3 mL Intravenous Q12H   tiZANidine   4 mg Oral BID   venlafaxine  XR  150 mg Oral Q breakfast   Continuous Infusions:  sodium chloride      cefTRIAXone  (ROCEPHIN )  IV     PRN Meds: albuterol , bisacodyl, morphine  injection, ondansetron  **OR** ondansetron  (ZOFRAN ) IV, oxyCODONE , polyethylene  glycol, sodium chloride  flush  Time spent: 35 minutes  Author: ELVAN SOR. MD [facility] 10/11/2023 2:40 PM  To reach On-call, see care teams to locate the attending and reach out to them via [email or web address]. If 7PM-7AM, please contact night-coverage If you still have difficulty reaching the attending provider, please page the [facility] (Director on Call) for [facility] on amion for assistance.

## 2023-10-11 NOTE — Plan of Care (Signed)
  Problem: Education: Goal: Ability to describe self-care measures that may prevent or decrease complications (Diabetes Survival Skills Education) will improve Outcome: Progressing   Problem: Metabolic: Goal: Ability to maintain appropriate glucose levels will improve Outcome: Progressing   Problem: Nutritional: Goal: Maintenance of adequate nutrition will improve Outcome: Progressing   Problem: Tissue Perfusion: Goal: Adequacy of tissue perfusion will improve Outcome: Progressing

## 2023-10-11 NOTE — Progress Notes (Signed)
 Lab called to collect morning labs now as per request by NP. Spoke with Tasha.

## 2023-10-12 DIAGNOSIS — N3000 Acute cystitis without hematuria: Secondary | ICD-10-CM

## 2023-10-12 LAB — BASIC METABOLIC PANEL WITH GFR
Anion gap: 8 (ref 5–15)
BUN: 10 mg/dL (ref 8–23)
CO2: 22 mmol/L (ref 22–32)
Calcium: 7.9 mg/dL — ABNORMAL LOW (ref 8.9–10.3)
Chloride: 114 mmol/L — ABNORMAL HIGH (ref 98–111)
Creatinine, Ser: 0.83 mg/dL (ref 0.44–1.00)
GFR, Estimated: >60 mL/min (ref >60)
Glucose, Bld: 127 mg/dL — ABNORMAL HIGH (ref 70–99)
Potassium: 4.1 mmol/L (ref 3.5–5.1)
Sodium: 144 mmol/L (ref 135–145)

## 2023-10-12 LAB — HEPATIC FUNCTION PANEL
ALT: 17 U/L (ref 0–44)
AST: 99 U/L — ABNORMAL HIGH (ref 15–41)
Albumin: 3.3 g/dL — ABNORMAL LOW (ref 3.5–5.0)
Alkaline Phosphatase: 53 U/L (ref 38–126)
Bilirubin, Direct: <0.1 mg/dL (ref 0.0–0.2)
Total Bilirubin: <0.2 mg/dL (ref 0.0–1.2)
Total Protein: 5.7 g/dL — ABNORMAL LOW (ref 6.5–8.1)

## 2023-10-12 LAB — CBC
HCT: 32.8 % — ABNORMAL LOW (ref 36.0–46.0)
Hemoglobin: 9.9 g/dL — ABNORMAL LOW (ref 12.0–15.0)
MCH: 24.7 pg — ABNORMAL LOW (ref 26.0–34.0)
MCHC: 30.2 g/dL (ref 30.0–36.0)
MCV: 81.8 fL (ref 80.0–100.0)
Platelets: 164 K/uL (ref 150–400)
RBC: 4.01 MIL/uL (ref 3.87–5.11)
RDW: 14.7 % (ref 11.5–15.5)
WBC: 3.8 K/uL — ABNORMAL LOW (ref 4.0–10.5)
nRBC: 0 % (ref 0.0–0.2)

## 2023-10-12 LAB — GLUCOSE, CAPILLARY
Glucose-Capillary: 113 mg/dL — ABNORMAL HIGH (ref 70–99)
Glucose-Capillary: 128 mg/dL — ABNORMAL HIGH (ref 70–99)
Glucose-Capillary: 131 mg/dL — ABNORMAL HIGH (ref 70–99)
Glucose-Capillary: 129 mg/dL — ABNORMAL HIGH (ref 70–99)

## 2023-10-12 LAB — CK: Total CK: 4382 U/L — ABNORMAL HIGH (ref 38–234)

## 2023-10-12 LAB — PHOSPHORUS: Phosphorus: 2.5 mg/dL (ref 2.5–4.6)

## 2023-10-12 LAB — MAGNESIUM: Magnesium: 2.4 mg/dL (ref 1.7–2.4)

## 2023-10-12 MED ORDER — SODIUM CHLORIDE 0.9 % IV SOLN
INTRAVENOUS | Status: DC
Start: 2023-10-12 — End: 2023-10-13

## 2023-10-12 MED ORDER — OXYCODONE HCL 5 MG PO TABS
5.0000 mg | ORAL_TABLET | Freq: Four times a day (QID) | ORAL | Status: DC | PRN
Start: 2023-10-12 — End: 2023-10-15
  Administered 2023-10-12 – 2023-10-15 (×11): 5 mg via ORAL
  Filled 2023-10-12 (×12): qty 1

## 2023-10-12 MED ORDER — OXYCODONE-ACETAMINOPHEN 10-325 MG PO TABS: 1.0000 | ORAL_TABLET | Freq: Four times a day (QID) | ORAL | Status: DC | PRN

## 2023-10-12 MED ORDER — OXYCODONE-ACETAMINOPHEN 5-325 MG PO TABS
1.0000 | ORAL_TABLET | Freq: Four times a day (QID) | ORAL | Status: DC | PRN
  Administered 2023-10-12 – 2023-10-15 (×10): 1 via ORAL
  Filled 2023-10-12 (×11): qty 1

## 2023-10-12 NOTE — Plan of Care (Signed)

## 2023-10-12 NOTE — Progress Notes (Signed)
 Mobility Specialist - Progress Note   10/12/23 0908  Mobility  Activity Ambulated with assistance  Level of Assistance Moderate assist, patient does 50-74%  Assistive Device Front wheel walker  Distance Ambulated (ft) 70 ft  Range of Motion/Exercises Active  Activity Response Tolerated well  Mobility Referral Yes  Mobility visit 1 Mobility  Mobility Specialist Start Time (ACUTE ONLY) 0855  Mobility Specialist Stop Time (ACUTE ONLY) 0908  Mobility Specialist Time Calculation (min) (ACUTE ONLY) 13 min   Pt was found in bed and agreeable to ambulate after using BSC. C/o arthritic pain during session. At EOS returned to recliner chair with all needs met. Call bell in reach and chair alarm on. RN notified.   Stacy Campbell,  Mobility Specialist Can be reached via Secure Chat

## 2023-10-12 NOTE — Plan of Care (Signed)
  Problem: Safety: Goal: Ability to remain free from injury will improve Outcome: Progressing   Problem: Pain Managment: Goal: General experience of comfort will improve and/or be controlled Outcome: Progressing

## 2023-10-12 NOTE — Plan of Care (Signed)
  Problem: Fluid Volume: Goal: Ability to maintain a balanced intake and output will improve Outcome: Progressing   Problem: Nutritional: Goal: Maintenance of adequate nutrition will improve Outcome: Progressing   Problem: Skin Integrity: Goal: Risk for impaired skin integrity will decrease Outcome: Progressing   Problem: Clinical Measurements: Goal: Ability to maintain clinical measurements within normal limits will improve Outcome: Progressing   Problem: Activity: Goal: Risk for activity intolerance will decrease Outcome: Progressing   Problem: Nutrition: Goal: Adequate nutrition will be maintained Outcome: Progressing

## 2023-10-12 NOTE — Progress Notes (Signed)
 PROGRESS NOTE  Stacy Campbell FMW:[id number] DOB: [birth year] DOA: 10/10/2023 PCP: [name], MD   LOS: 1 day   Brief narrative:  Stacy Campbell is a 68 y.o. female with past medical history of A-fib on DOAC, hypertension, diabetes mellitus type 2, depression, liver cirrhosis, hep C, MGUS , COPD/asthma, psoriasis, chronic lower backache with left sciatica and difficulty walking and frequent falls presented to [name] ED with complaining of fall at home.  History of multiple falls. This time after the fall patient was on the floor laid on the floor for 3 hours until her brother found her in the morning and called ambulance.  Denied head trauma or loss of consciousness.  In the ED, patient was tachycardic and slightly hypertensive.  Lactic acid was elevated at 4.4.  CK level was elevated at 3325.  UA was positive for infection.  CBC without leukocytosis.  CT head scan C-spine right shoulder and x-ray of pelvis was negative for acute fracture including lumbar x-ray.  Patient was then considered for admission to hospital for further evaluation and treatment   assessment/Plan: Principal Problem:   UTI (urinary tract infection)   UTI (urinary tract infection) Urinalysis is abnormal.  Blood cultures negative in 2 days.  Continue Rocephin  to complete 3-day course.SABRA  Urine culture negative so far.   # Urinary retention Ultrasound showed large postvoid residual.  At this time she has refused Foley catheter placement.  Has been voiding on her own.    Rhabdomyolysis CK 3325> 3679 and now trending up to 4382.  Continue to hold statins.  Check CK levels in AM.  Continue IV fluids for 1     Hypokalemia, potassium was replenished.  Latest potassium of 4.1.  Hypomagnesemia, improved after replacement   Lactic acidosis, lactate of 4.4 on presentation.  Nonseptic.  Likely secondary to metformin .   Chronic backache with left-sided sciatica MRI lumbar spine showed Severe DDD L3-4  progressed since prior study.  Mild spinal stenosis with disc bulging and mild facet arthropathy.  Moderate facet arthropathy at L4-5 worsened since prior study, but at L5-S1 similar to prior study.  On analgesics for precaution PT OT evaluation small disc herniation and contact with right S1 nerve root is unchanged.  Essential HTN Currently in sinus rhythm.  Patient is on Coreg  Cardizem  lisinopril  and Eliquis .  Due to low blood pressures Cardizem  Coreg  and lisinopril  on hold. Reconsider starting by tomorrow Diabetes mellitus type 2. Current sliding scale insulin.  Hold oral hypoglycemics.  Latest POC glucose of 128    History of liver cirrhosis, history of hep C No active issues at this time.   h/o MGUS, patient is not aware.  Recommend to follow with hematology as an outpatient    Psoriasis, patient is on CellCept  and tacrolimus topical lower extremity psoriasis during flareups. CellCept  on hold due to acute infection.   Depression, continued BuSpar , doxepin  and Effexor     Vitamin B12 level 357, goal >400.  Continue vitamin B 12 oral supplement ]  Folic acid level 6.1, at lower end.  Continue folic acid 1 mg p.o. daily.   DVT prophylaxis: apixaban  (ELIQUIS ) tablet 5 mg Start: 10/10/23 2200 apixaban  (ELIQUIS ) tablet 5 mg   Disposition: Home with home health likely in 1 to 2 days  Status is: Inpatient Remains inpatient appropriate because: IV fluids, pending clinical improvement,    Code Status:     Code Status: Full Code  Family Communication: None at bedside  Consultants: None  Procedures:  None  Anti-infectives:  Ceftriaxone   Anti-infectives (From admission, onward)    Start     Dose/Rate Route Frequency Ordered Stop   10/11/23 1000  cefTRIAXone  (ROCEPHIN ) 1 g in sodium chloride  0.9 % 100 mL IVPB        1 g 200 mL/hr over 30 Minutes Intravenous Every 24 hours 10/10/23 1248     10/10/23 1000  cefTRIAXone  (ROCEPHIN ) 1 g in sodium chloride  0.9 % 100 mL IVPB         1 g 200 mL/hr over 30 Minutes Intravenous  Once 10/10/23 0950 10/10/23 1116      Subjective: Today, patient was seen and examined at bedside.  Patient stated that she was able to get up to the chair.  Denies any nausea vomiting fever chills shortness of breath or dyspnea.  Objective: Vitals:   10/11/23 2013 10/12/23 0603  BP: (!) 129/52 115/60  Pulse: 81 68  Resp: 16 16  Temp: 98.5 F (36.9 C) 97.8 F (36.6 C)  SpO2: 100% 99%    Intake/Output Summary (Last 24 hours) at 10/12/2023 1523 Last data filed at 10/12/2023 1000 Gross per 24 hour  Intake 2127.76 ml  Output 650 ml  Net 1477.76 ml   Filed Weights   10/10/23 0720  Weight: 105.8 kg   Body mass index is 40.04 kg/m.   Physical Exam:  GENERAL: Patient is alert awake and oriented. Not in obvious distress.  Obese build. HENT: No scleral pallor or icterus. Pupils equally reactive to light. Oral mucosa is moist NECK: is supple, no gross swelling noted. CHEST: Clear to auscultation.  Diminished breath sounds bilaterally. CVS: S1 and S2 heard, no murmur. Regular rate and rhythm.  ABDOMEN: Soft, non-tender, bowel sounds are present. EXTREMITIES: No edema. CNS: Cranial nerves are intact. No focal motor deficits. SKIN: warm and dry without rashes.  Data Review: I have personally reviewed the following laboratory data and studies,  CBC: Recent Labs  Lab 10/10/23 0742 10/11/23 0418 10/12/23 0509  WBC 8.1 5.2 3.8*  NEUTROABS 6.6  --   --   HGB 13.5 10.5* 9.9*  HCT 44.6 33.6* 32.8*  MCV 80.8 80.2 81.8  PLT 238 185 164   Basic Metabolic Panel: Recent Labs  Lab 10/10/23 0725 10/11/23 0418 10/11/23 0820 10/12/23 0509  NA 145 144 146* 144  K 3.7 2.8* 3.1* 4.1  CL 108 111 112* 114*  CO2 21* 23 23 22   GLUCOSE 146* 120* 117* 127*  BUN 8 7* 7* 10  CREATININE 0.82 0.81 0.73 0.83  CALCIUM  9.5 7.9* 8.0* 7.9*  MG  --  1.5*  --  2.4  PHOS  --  2.7  --  2.5   Liver Function Tests: Recent Labs  Lab 10/10/23 0725  10/11/23 0418 10/12/23 0509  AST 77* 82* 99*  ALT 18 13 17   ALKPHOS 95 65 53  BILITOT 0.4 0.2 <0.2  PROT 7.6 5.5* 5.7*  ALBUMIN 4.1 3.2* 3.3*   No results for input(s): LIPASE, AMYLASE in the last 168 hours. No results for input(s): AMMONIA in the last 168 hours. Cardiac Enzymes: Recent Labs  Lab 10/10/23 0742 10/11/23 0418 10/12/23 0509  CKTOTAL 3,325* 3,679* 4,382*   BNP (last 3 results) No results for input(s): BNP in the last 8760 hours.  ProBNP (last 3 results) No results for input(s): PROBNP in the last 8760 hours.  CBG: Recent Labs  Lab 10/11/23 1153 10/11/23 1634 10/11/23 2010 10/12/23 0736 10/12/23 1136  GLUCAP 173* 87 140* 113* 128*  Recent Results (from the past 240 hours)  Urine Culture (for pregnant, neutropenic or urologic patients or patients with an indwelling urinary catheter)     Status: None   Collection Time: 10/10/23  9:00 AM   Specimen: Urine, Clean Catch  Result Value Ref Range Status   Specimen Description   Final    URINE, CLEAN CATCH Performed at [facility], [address], [place], [place] [address]    Special Requests   Final    NONE Performed at [facility], [address], [place], [place] [address]    Culture   Final    NO GROWTH Performed at [facility], [address] [place], [place] [address]    Report Status 10/11/2023 FINAL  Final  Blood culture (routine x 2)     Status: None (Preliminary result)   Collection Time: 10/10/23  9:55 AM   Specimen: BLOOD  Result Value Ref Range Status   Specimen Description   Final    BLOOD BLOOD LEFT HAND Performed at [facility], [address] [place], [place] [address]    Special Requests   Final    BOTTLES DRAWN AEROBIC ONLY Blood Culture results may not be optimal due to an inadequate volume of blood received in culture bottles Performed at [facility], [address],  [place], [place] [address]    Culture   Final    NO GROWTH 2 DAYS Performed at [facility], [address], [place], [place] [address]    Report Status PENDING  Incomplete  Blood culture (routine x 2)     Status: None (Preliminary result)   Collection Time: 10/10/23 10:17 AM   Specimen: BLOOD  Result Value Ref Range Status   Specimen Description   Final    BLOOD RIGHT ANTECUBITAL Performed at [facility], [address] [place], [place] [address]    Special Requests   Final    BOTTLES DRAWN AEROBIC AND ANAEROBIC Blood Culture results may not be optimal due to an inadequate volume of blood received in culture bottles Performed at [facility], [address], [place], [place] [address]    Culture   Final    NO GROWTH 2 DAYS Performed at [facility], [address], [place], [place] [address]    Report Status PENDING  Incomplete     Studies: MR LUMBAR SPINE WO CONTRAST Result Date: 10/11/2023 CLINICAL DATA:  Low back pain, progressive lower extremity weakness EXAM: MRI LUMBAR SPINE WITHOUT CONTRAST TECHNIQUE: Multiplanar, multisequence MR imaging of the lumbar spine was performed. No intravenous contrast was administered. COMPARISON:  December 07, 2015 FINDINGS: Bone marrow: There are Modic type 2 changes at L3-4 Conus and cauda equina: No significant abnormality Paraspinal tissues: No significant abnormality L1-L2: The disc is normal.  Mild facet arthropathy L2-L3: The disc is normal.  Mild facet arthropathy L3-L4: There is severe degenerative disc disease with a broad-based disc bulge. There is mild facet enlargement. There is mild spinal stenosis. Moderate left neural foraminal stenosis due to lateral bulging of the disc. L4-L5: There is a mild disc bulge. There is moderate facet arthropathy. There is no spinal stenosis or foraminal stenosis L5-S1: There is mild degenerative disc disease with a mild disc bulge. There is moderate facet  arthropathy. There is no spinal stenosis or significant foraminal stenosis. Small right paracentral disc herniation in contact with the right S1 nerve root. IMPRESSION:  1. There is severe degenerative disc disease at L3-4 which has progressed since the prior study. There is mild spinal stenosis due to the disc bulge and mild facet arthropathy 2. There is moderate facet arthropathy at L4-5 which has worsened since the prior study 3. There is moderate facet arthropathy at L5-S1 which is similar to the prior study. A small right paracentral disc herniation at L5-S1 and contact with the right S1 nerve root is unchanged compared with the prior study. Electronically Signed   By: Nancyann Burns M.D.   On: 10/11/2023 11:58   US  RENAL Result Date: 10/10/2023 CLINICAL DATA:  UTI EXAM: RENAL / URINARY TRACT ULTRASOUND COMPLETE COMPARISON:  Ultrasound abdomen April 18, 2023 FINDINGS: Right Kidney: Renal measurements: 10x4.9 x 5 cm = volume: 128 mL. Echogenicity within normal limits. No mass or hydronephrosis visualized. Left Kidney: Renal measurements: 10.3 x 4.6 x 4.6 cm = volume: 111 mL. Echogenicity within normal limits. No mass or hydronephrosis visualized. Bladder: Grossly appears normal for degree of bladder distention with normal wall thickness. No intraluminal stone or mass lesion identified. Prevoid bladder volume measures 270 mL and on postvoid there is 121 mL remaining. Other: None. IMPRESSION: Large postvoid residual urine within the bladder. Electronically Signed   By: Megan  Zare M.D.   On: 10/10/2023 17:53      Vernal Alstrom, MD  [facility] 10/12/2023  If 7PM-7AM, please contact night-coverage

## 2023-10-13 DIAGNOSIS — N3000 Acute cystitis without hematuria: Secondary | ICD-10-CM | POA: Diagnosis not present

## 2023-10-13 LAB — BASIC METABOLIC PANEL WITH GFR
Anion gap: 8 (ref 5–15)
BUN: 11 mg/dL (ref 8–23)
CO2: 22 mmol/L (ref 22–32)
Calcium: 7.9 mg/dL — ABNORMAL LOW (ref 8.9–10.3)
Chloride: 113 mmol/L — ABNORMAL HIGH (ref 98–111)
Creatinine, Ser: 0.62 mg/dL (ref 0.44–1.00)
GFR, Estimated: >60 mL/min (ref >60)
Glucose, Bld: 102 mg/dL — ABNORMAL HIGH (ref 70–99)
Potassium: 4.3 mmol/L (ref 3.5–5.1)
Sodium: 143 mmol/L (ref 135–145)

## 2023-10-13 LAB — CBC
HCT: 35 % — ABNORMAL LOW (ref 36.0–46.0)
Hemoglobin: 10.1 g/dL — ABNORMAL LOW (ref 12.0–15.0)
MCH: 23.9 pg — ABNORMAL LOW (ref 26.0–34.0)
MCHC: 28.9 g/dL — ABNORMAL LOW (ref 30.0–36.0)
MCV: 82.9 fL (ref 80.0–100.0)
Platelets: 179 K/uL (ref 150–400)
RBC: 4.22 MIL/uL (ref 3.87–5.11)
RDW: 14.7 % (ref 11.5–15.5)
WBC: 3.6 K/uL — ABNORMAL LOW (ref 4.0–10.5)
nRBC: 0 % (ref 0.0–0.2)

## 2023-10-13 LAB — HEPATIC FUNCTION PANEL
ALT: 23 U/L (ref 0–44)
AST: 158 U/L — ABNORMAL HIGH (ref 15–41)
Albumin: 3.2 g/dL — ABNORMAL LOW (ref 3.5–5.0)
Alkaline Phosphatase: 56 U/L (ref 38–126)
Bilirubin, Direct: <0.1 mg/dL (ref 0.0–0.2)
Total Bilirubin: <0.2 mg/dL (ref 0.0–1.2)
Total Protein: 5.7 g/dL — ABNORMAL LOW (ref 6.5–8.1)

## 2023-10-13 LAB — MAGNESIUM: Magnesium: 2.1 mg/dL (ref 1.7–2.4)

## 2023-10-13 LAB — GLUCOSE, CAPILLARY
Glucose-Capillary: 106 mg/dL — ABNORMAL HIGH (ref 70–99)
Glucose-Capillary: 109 mg/dL — ABNORMAL HIGH (ref 70–99)
Glucose-Capillary: 112 mg/dL — ABNORMAL HIGH (ref 70–99)
Glucose-Capillary: 147 mg/dL — ABNORMAL HIGH (ref 70–99)

## 2023-10-13 LAB — PHOSPHORUS: Phosphorus: 2.4 mg/dL — ABNORMAL LOW (ref 2.5–4.6)

## 2023-10-13 LAB — CK: Total CK: 6925 U/L — ABNORMAL HIGH (ref 38–234)

## 2023-10-13 MED ORDER — CARVEDILOL 12.5 MG PO TABS
12.5000 mg | ORAL_TABLET | Freq: Two times a day (BID) | ORAL | Status: DC
  Administered 2023-10-13 – 2023-10-14 (×2): 12.5 mg via ORAL
  Filled 2023-10-13 (×2): qty 1

## 2023-10-13 MED ORDER — DILTIAZEM HCL ER COATED BEADS 120 MG PO CP24
360.0000 mg | ORAL_CAPSULE | Freq: Every day | ORAL | Status: DC
  Administered 2023-10-14 – 2023-10-15 (×2): 360 mg via ORAL
  Filled 2023-10-13 (×2): qty 1

## 2023-10-13 MED ORDER — SODIUM CHLORIDE 0.9 % IV SOLN: INTRAVENOUS | Status: AC

## 2023-10-13 NOTE — Progress Notes (Signed)
 Occupational Therapy Treatment Patient Details Name: Stacy Campbell MRN: [id number] DOB: 22-May-1955 Today's Date: 10/13/2023   History of present illness Patient presenting to the ED status post fall on 10/10/23. Imaging negative for acute changes, admitted with UTI and rhabdomyolysis. MRI Lumbar: severe degenerative disc disease at L3-4. PMH includes: type 2 diabetes, cirrhosis, hyperlipidemia, asthma/COPD, hypertension, MGUS, paroxysmal atrial fibrillation on Eliquis , OSA   OT comments  Pt participative. Family supportive.  Will have A as needed      If plan is discharge home, recommend the following:  A little help with walking and/or transfers;A little help with bathing/dressing/bathroom;Assistance with cooking/housework;Assist for transportation;Help with stairs or ramp for entrance   Equipment Recommendations  Tub/shower bench    Recommendations for Other Services      Precautions / Restrictions Precautions Precautions: Fall       Mobility Bed Mobility               General bed mobility comments: pt in chair    Transfers Overall transfer level: Needs assistance Equipment used: Rolling walker (2 wheels) Transfers: Sit to/from Stand Sit to Stand: Contact guard assist           General transfer comment: VC for hand placement     Balance Overall balance assessment: Needs assistance Sitting-balance support: No upper extremity supported Sitting balance-Leahy Scale: Good     Standing balance support: Bilateral upper extremity supported, Reliant on assistive device for balance Standing balance-Leahy Scale: Poor Standing balance comment: Used RW , did not attempt without                           ADL either performed or assessed with clinical judgement   ADL Overall ADL's : Needs assistance/impaired Eating/Feeding: Set up;Sitting   Grooming: Wash/dry hands;Wash/dry face;Set up;Standing   Upper Body Bathing: Contact guard  assist;Sitting   Lower Body Bathing: Minimal assistance;Moderate assistance;Sit to/from stand;Sitting/lateral leans   Upper Body Dressing : Contact guard assist;Sitting   Lower Body Dressing: Minimal assistance;Moderate assistance;Sit to/from stand;Sitting/lateral leans   Toilet Transfer: Minimal assistance;Ambulation;BSC/3in1 Statistician Details (indicate cue type and reason): simulated Toileting- Clothing Manipulation and Hygiene: Contact guard assist;Sit to/from stand;Sitting/lateral lean       Functional mobility during ADLs: Minimal assistance;Cueing for safety;Cueing for sequencing;Rolling walker (2 wheels) General ADL Comments: FAmily will A as needed    Extremity/Trunk Assessment Upper Extremity Assessment Upper Extremity Assessment: Generalized weakness       Cervical / Trunk Assessment Cervical / Trunk Assessment: Kyphotic Cervical / Trunk Exceptions: increased body habitus    Vision Patient Visual Report: No change from baseline Vision Assessment?: Wears glasses for reading         Communication Communication Communication: No apparent difficulties   Cognition Arousal: Alert Behavior During Therapy: WFL for tasks assessed/performed Cognition: No apparent impairments                               Following commands: Intact        Cueing   Cueing Techniques: Verbal cues   Progress Toward Goals  OT Goals(current goals can now be found in the care plan section)  Progress towards OT goals: Progressing toward goals      AM-PAC OT 6 Clicks Daily Activity     Outcome Measure   Help from another person eating meals?: A Little Help from another person taking care of personal grooming?:  A Little Help from another person toileting, which includes using toliet, bedpan, or urinal?: A Little Help from another person bathing (including washing, rinsing, drying)?: A Little Help from another person to put on and taking off regular upper body  clothing?: A Little Help from another person to put on and taking off regular lower body clothing?: A Little 6 Click Score: 18    End of Session    OT Visit Diagnosis: Unsteadiness on feet (R26.81);Other abnormalities of gait and mobility (R26.89);Repeated falls (R29.6);History of falling (Z91.81);Muscle weakness (generalized) (M62.81)   Activity Tolerance     Patient Left in chair;with call bell/phone within reach;with family/visitor present   Nurse Communication          Time: 1250-1310 OT Time Calculation (min): 20 min  Charges: OT General Charges $OT Visit: 1 Visit OT Treatments $Self Care/Home Management : 8-22 mins    Anayla Giannetti, Norvel D 10/13/2023, 1:29 PM

## 2023-10-13 NOTE — Plan of Care (Signed)
  Problem: Coping: Goal: Ability to adjust to condition or change in health will improve Outcome: Progressing   Problem: Metabolic: Goal: Ability to maintain appropriate glucose levels will improve Outcome: Progressing   Problem: Nutritional: Goal: Maintenance of adequate nutrition will improve Outcome: Progressing   Problem: Skin Integrity: Goal: Risk for impaired skin integrity will decrease Outcome: Progressing   

## 2023-10-13 NOTE — Progress Notes (Signed)
 PROGRESS NOTE  Stacy Campbell FMW:[id number] DOB: 1955-02-08 DOA: 10/10/2023 PCP: [name], MD   LOS: 2 days   Brief narrative:  Stacy Campbell is a 68 y.o. female with past medical history of A-fib on DOAC, hypertension, diabetes mellitus type 2, depression, liver cirrhosis, hep C, MGUS , COPD/asthma, psoriasis, chronic lower backache with left sciatica and difficulty walking and frequent falls presented to [name] ED with complaining of fall at home.  History of multiple falls. This time after the fall patient was on the floor laid on the floor for 3 hours until her brother found her in the morning and called ambulance.  Denied head trauma or loss of consciousness.  In the ED, patient was tachycardic and slightly hypertensive.  Lactic acid was elevated at 4.4.  CK level was elevated at 3325.  UA was positive for infection.  CBC without leukocytosis.  CT head scan C-spine right shoulder and x-ray of pelvis was negative for acute fracture including lumbar x-ray.  Patient was then considered for admission to hospital for further evaluation and treatment   assessment/Plan: Principal Problem:   UTI (urinary tract infection)   UTI (urinary tract infection) Urinalysis is abnormal.  Blood cultures negative in 2 days.  Continue Rocephin  to complete 3-day course.  Urine culture negative so far.  Blood cultures negative in 3 days.    Urinary retention Ultrasound showed large postvoid residual.  At this time she has refused Foley catheter placement.  Has been voiding on her own.    Rhabdomyolysis CK 3325> 3679 and now trending up to 4382.  Continue to hold statins.  Check CK levels in AM.  Continue IV fluids for 1 day     Hypokalemia, potassium was replenished.  Latest potassium of 4.3.  Hypomagnesemia, improved after replacement, latest level of 2.1   Lactic acidosis, lactate of 4.4 on presentation.  Nonseptic.  Likely secondary to metformin .   Chronic backache with  left-sided sciatica MRI lumbar spine showed Severe DDD L3-4 progressed since prior study.  Mild spinal stenosis with disc bulging and mild facet arthropathy.  Moderate facet arthropathy at L4-5 worsened since prior study, but at L5-S1 similar to prior study.  On analgesics for precaution PT OT evaluation small disc herniation and contact with right S1 nerve root is unchanged.  Essential HTN Currently in sinus rhythm.  Patient is on Coreg  Cardizem  lisinopril  and Eliquis .  Due to low blood pressures Cardizem  Coreg  and lisinopril  on hold.  Will restart today with a half dose of Coreg   Diabetes mellitus type 2. Current sliding scale insulin.  Hold oral hypoglycemics.  Latest POC glucose of 128    History of liver cirrhosis, history of hep C No active issues at this time.   h/o MGUS, patient is not aware.  Recommend to follow with hematology as an outpatient    Psoriasis, patient is on CellCept  and tacrolimus topical lower extremity psoriasis during flareups. CellCept  on hold due to acute infection.   Depression, continued BuSpar , doxepin  and Effexor     Vitamin B12 level 357, goal >400.  Continue vitamin B 12 oral supplement   Folic acid level 6.1, at lower end.  Continue folic acid 1 mg p.o. daily.   DVT prophylaxis: apixaban  (ELIQUIS ) tablet 5 mg Start: 10/10/23 2200 apixaban  (ELIQUIS ) tablet 5 mg   Disposition: Home with home health likely in 1 to 2 days  Status is: Inpatient Remains inpatient appropriate because: IV fluids, pending clinical improvement, elevated CK level  Code Status:     Code Status: Full Code  Family Communication: None at bedside  Consultants: None  Procedures: None  Anti-infectives:  Ceftriaxone   Anti-infectives (From admission, onward)    Start     Dose/Rate Route Frequency Ordered Stop   10/11/23 1000  cefTRIAXone  (ROCEPHIN ) 1 g in sodium chloride  0.9 % 100 mL IVPB        1 g 200 mL/hr over 30 Minutes Intravenous Every 24 hours 10/10/23 1248      10/10/23 1000  cefTRIAXone  (ROCEPHIN ) 1 g in sodium chloride  0.9 % 100 mL IVPB        1 g 200 mL/hr over 30 Minutes Intravenous  Once 10/10/23 0950 10/10/23 1116      Subjective: Today, patient was seen and examined at bedside.  Patient stated that she complains of back pain.  He was able to ambulate to the bathroom today.  Denies any shortness of breath dyspnea cough or chills.  Objective: Vitals:   10/13/23 0457 10/13/23 1320  BP: (!) 155/103 (!) 143/65  Pulse: 77 72  Resp:  20  Temp: 97.7 F (36.5 C) 98.6 F (37 C)  SpO2: 100% 99%    Intake/Output Summary (Last 24 hours) at 10/13/2023 1601 Last data filed at 10/13/2023 1320 Gross per 24 hour  Intake 3741.9 ml  Output 600 ml  Net 3141.9 ml   Filed Weights   10/10/23 0720  Weight: 105.8 kg   Body mass index is 40.04 kg/m.   Physical Exam:  GENERAL: Patient is alert awake and oriented. Not in obvious distress.  Obese build. HENT: No scleral pallor or icterus. Pupils equally reactive to light. Oral mucosa is moist NECK: is supple, no gross swelling noted. CHEST: Clear to auscultation.  Diminished breath sounds bilaterally. CVS: S1 and S2 heard, no murmur. Regular rate and rhythm.  ABDOMEN: Soft, non-tender, bowel sounds are present. EXTREMITIES: No edema. CNS: Cranial nerves are intact. No focal motor deficits. SKIN: warm and dry without rashes.  Data Review: I have personally reviewed the following laboratory data and studies,  CBC: Recent Labs  Lab 10/10/23 0742 10/11/23 0418 10/12/23 0509 10/13/23 0543  WBC 8.1 5.2 3.8* 3.6*  NEUTROABS 6.6  --   --   --   HGB 13.5 10.5* 9.9* 10.1*  HCT 44.6 33.6* 32.8* 35.0*  MCV 80.8 80.2 81.8 82.9  PLT 238 185 164 179   Basic Metabolic Panel: Recent Labs  Lab 10/10/23 0725 10/11/23 0418 10/11/23 0820 10/12/23 0509 10/13/23 0543  NA 145 144 146* 144 143  K 3.7 2.8* 3.1* 4.1 4.3  CL 108 111 112* 114* 113*  CO2 21* 23 23 22 22   GLUCOSE 146* 120* 117* 127*  102*  BUN 8 7* 7* 10 11  CREATININE 0.82 0.81 0.73 0.83 0.62  CALCIUM  9.5 7.9* 8.0* 7.9* 7.9*  MG  --  1.5*  --  2.4 2.1  PHOS  --  2.7  --  2.5 2.4*   Liver Function Tests: Recent Labs  Lab 10/10/23 0725 10/11/23 0418 10/12/23 0509 10/13/23 0543  AST 77* 82* 99* 158*  ALT 18 13 17 23   ALKPHOS 95 65 53 56  BILITOT 0.4 0.2 <0.2 <0.2  PROT 7.6 5.5* 5.7* 5.7*  ALBUMIN 4.1 3.2* 3.3* 3.2*   No results for input(s): LIPASE, AMYLASE in the last 168 hours. No results for input(s): AMMONIA in the last 168 hours. Cardiac Enzymes: Recent Labs  Lab 10/10/23 9257 10/11/23 0418 10/12/23 0509 10/13/23 0543  CKTOTAL 3,325* 3,679* 4,382* 6,925*   BNP (last 3 results) No results for input(s): BNP in the last 8760 hours.  ProBNP (last 3 results) No results for input(s): PROBNP in the last 8760 hours.  CBG: Recent Labs  Lab 10/12/23 1136 10/12/23 1723 10/12/23 2114 10/13/23 0746 10/13/23 1120  GLUCAP 128* 131* 129* 106* 109*   Recent Results (from the past 240 hours)  Urine Culture (for pregnant, neutropenic or urologic patients or patients with an indwelling urinary catheter)     Status: None   Collection Time: 10/10/23  9:00 AM   Specimen: Urine, Clean Catch  Result Value Ref Range Status   Specimen Description   Final    URINE, CLEAN CATCH Performed at [facility], [address], [place], [place] [address]    Special Requests   Final    NONE Performed at [facility], [address], [place], [place] [address]    Culture   Final    NO GROWTH Performed at [facility], [address], [place], [place] [address]    Report Status 10/11/2023 FINAL  Final  Blood culture (routine x 2)     Status: None (Preliminary result)   Collection Time: 10/10/23  9:55 AM   Specimen: BLOOD  Result Value Ref Range Status   Specimen Description   Final    BLOOD BLOOD LEFT HAND Performed at [facility], [address] [place], [place] [address]    Special Requests   Final    BOTTLES DRAWN AEROBIC ONLY Blood Culture results may not be optimal due to an inadequate volume of blood received in culture bottles Performed at [facility], [address], [place], [place] [address]    Culture   Final    NO GROWTH 3 DAYS Performed at [facility], [address], [place], [place] [address]    Report Status PENDING  Incomplete  Blood culture (routine x 2)     Status: None (Preliminary result)   Collection Time: 10/10/23 10:17 AM   Specimen: BLOOD  Result Value Ref Range Status   Specimen Description   Final    BLOOD RIGHT ANTECUBITAL Performed at [facility], [address], [place], [place] [address]    Special Requests   Final    BOTTLES DRAWN AEROBIC AND ANAEROBIC Blood Culture results may not be optimal due to an inadequate volume of blood received in culture bottles Performed at [facility], [address], [place], [place] [address]    Culture   Final    NO GROWTH 3 DAYS Performed at [facility], [address] [place], [place] [address]    Report Status PENDING  Incomplete     Studies: No results found.   Edwardine Deschepper, MD  [facility] 10/13/2023  If 7PM-7AM, please contact night-coverage

## 2023-10-14 DIAGNOSIS — N3000 Acute cystitis without hematuria: Secondary | ICD-10-CM | POA: Diagnosis not present

## 2023-10-14 LAB — GLUCOSE, CAPILLARY
Glucose-Capillary: 105 mg/dL — ABNORMAL HIGH (ref 70–99)
Glucose-Capillary: 115 mg/dL — ABNORMAL HIGH (ref 70–99)
Glucose-Capillary: 109 mg/dL — ABNORMAL HIGH (ref 70–99)
Glucose-Capillary: 106 mg/dL — ABNORMAL HIGH (ref 70–99)

## 2023-10-14 LAB — CK: Total CK: 7714 U/L — ABNORMAL HIGH (ref 38–234)

## 2023-10-14 MED ORDER — FUROSEMIDE 10 MG/ML IJ SOLN
20.0000 mg | Freq: Once | INTRAMUSCULAR | Status: AC
  Administered 2023-10-14: 20 mg via INTRAVENOUS
  Filled 2023-10-14: qty 2

## 2023-10-14 MED ORDER — SODIUM CHLORIDE 0.9 % IV SOLN: INTRAVENOUS | Status: AC

## 2023-10-14 MED ORDER — CARVEDILOL 25 MG PO TABS
25.0000 mg | ORAL_TABLET | Freq: Two times a day (BID) | ORAL | Status: DC
  Administered 2023-10-14 – 2023-10-15 (×2): 25 mg via ORAL
  Filled 2023-10-14 (×2): qty 1

## 2023-10-14 NOTE — Progress Notes (Signed)
 Physical Therapy Treatment Patient Details Name: Stacy Campbell MRN: [id number] DOB: [birth year] Today's Date: 10/14/2023   History of Present Illness Patient presenting to the ED status post fall on 10/10/23. Imaging negative for acute changes, admitted with UTI and rhabdomyolysis. MRI Lumbar: severe degenerative disc disease at L3-4. PMH includes: type 2 diabetes, cirrhosis, hyperlipidemia, asthma/COPD, hypertension, MGUS, paroxysmal atrial fibrillation on Eliquis , OSA    PT Comments  Pt progressing well this session. Tol incr gait distance without need for therapeutic rest breaks. Continue to recommend HHPT upon d/c    If plan is discharge home, recommend the following: A little help with walking and/or transfers;A little help with bathing/dressing/bathroom;Assistance with cooking/housework;Help with stairs or ramp for entrance   Can travel by private vehicle        Equipment Recommendations  None recommended by PT    Recommendations for Other Services       Precautions / Restrictions Precautions Precautions: Fall Recall of Precautions/Restrictions: Intact     Mobility  Bed Mobility               General bed mobility comments: pt in chair    Transfers Overall transfer level: Needs assistance Equipment used: Rolling walker (2 wheels) Transfers: Sit to/from Stand Sit to Stand: Contact guard assist, Supervision           General transfer comment: STS x3 from varied ht surfaces. VC for hand placement, controlled descent  and overall safety    Ambulation/Gait Ambulation/Gait assistance: Contact guard assist, Supervision Gait Distance (Feet): 55 Feet (15' more) Assistive device: Rolling walker (2 wheels) Gait Pattern/deviations: Step-to pattern, Decreased stride length, Antalgic, Decreased stance time - left Gait velocity: decreased     General Gait Details: CGA to supervision for safety, slow but steady gait. pt reports significant fatigue however no  rest breaks needed during distance   Stairs             Wheelchair Mobility     Tilt Bed    Modified Rankin (Stroke Patients Only)       Balance Overall balance assessment: Needs assistance Sitting-balance support: No upper extremity supported, Feet supported Sitting balance-Leahy Scale: Good     Standing balance support: Bilateral upper extremity supported, Reliant on assistive device for balance, Single extremity supported, During functional activity Standing balance-Leahy Scale: Fair Standing balance comment: able to static stand  without UE support                            Communication Communication Communication: No apparent difficulties  Cognition Arousal: Alert Behavior During Therapy: WFL for tasks assessed/performed   PT - Cognitive impairments: No apparent impairments                         Following commands: Intact      Cueing Cueing Techniques: Verbal cues  Exercises      General Comments        Pertinent Vitals/Pain Pain Assessment Pain Assessment: Faces Faces Pain Scale: Hurts little more Pain Location: L sciatica chronic, and generalized soreness Pain Descriptors / Indicators: Grimacing, Guarding, Discomfort Pain Intervention(s): Limited activity within patient's tolerance, Monitored during session, Premedicated before session, Repositioned (offered heat or ice and pt declined)    Home Living                          Prior Function  PT Goals (current goals can now be found in the care plan section) Acute Rehab PT Goals Patient Stated Goal: return home PT Goal Formulation: With patient Time For Goal Achievement: 10/25/23 Potential to Achieve Goals: Good Progress towards PT goals: Progressing toward goals    Frequency    Min 3X/week      PT Plan      Co-evaluation              AM-PAC PT 6 Clicks Mobility   Outcome Measure  Help needed turning from your back to  your side while in a flat bed without using bedrails?: A Little Help needed moving from lying on your back to sitting on the side of a flat bed without using bedrails?: A Little Help needed moving to and from a bed to a chair (including a wheelchair)?: A Little Help needed standing up from a chair using your arms (e.g., wheelchair or bedside chair)?: A Little Help needed to walk in hospital room?: A Little Help needed climbing 3-5 steps with a railing? : A Little 6 Click Score: 18    End of Session Equipment Utilized During Treatment: Gait belt Activity Tolerance: Patient tolerated treatment well Patient left: in chair;with chair alarm set;with call bell/phone within reach Nurse Communication: Mobility status PT Visit Diagnosis: Other abnormalities of gait and mobility (R26.89);Muscle weakness (generalized) (M62.81);History of falling (Z91.81)     Time: 8392-8379 PT Time Calculation (min) (ACUTE ONLY): 13 min  Charges:    $Gait Training: 8-22 mins PT General Charges $$ ACUTE PT VISIT: 1 Visit                     Devanee Pomplun, PT  Acute Rehab Dept [facility]) [phone]  10/14/2023    [facility] 10/14/2023, 4:35 PM

## 2023-10-14 NOTE — Progress Notes (Signed)
 PROGRESS NOTE  Stacy Campbell FMW:[id number] DOB: 09-Mar-1955 DOA: 10/10/2023 PCP: [name], MD   LOS: 3 days   Brief narrative:  [name] is a 68 y.o. female with past medical history of A-fib on DOAC, hypertension, diabetes mellitus type 2, depression, liver cirrhosis, hep C, MGUS , COPD/asthma, psoriasis, chronic lower backache with left sciatica and difficulty walking and frequent falls presented to [facility], ED with complaining of fall at home.  History of multiple falls. This time after the fall patient was on the floor laid on the floor for 3 hours until her brother found her in the morning and called ambulance.  Denied head trauma or loss of consciousness.  In the ED, patient was tachycardic and slightly hypertensive.  Lactic acid was elevated at 4.4.  CK level was elevated at 3325.  UA was positive for infection.  CBC without leukocytosis.  CT head scan C-spine right shoulder and x-ray of pelvis was negative for acute fracture including lumbar x-ray.  Patient was then considered for admission to hospital for further evaluation and treatment   assessment/Plan: Principal Problem:   UTI (urinary tract infection)   UTI (urinary tract infection) Urinalysis is abnormal.  Blood cultures negative in 4 days.  Completed Rocephin  3-day course.  Urine culture negative so far.    Urinary retention Initial ultrasound showed large residual but improved to urinate on her own.    Rhabdomyolysis CK 3325> 3679 >4382 and still trending up to 7714..  Continue to hold statins.  Check CK levels in AM.  Continue IV fluids for 1 day.  Will give 1 dose of IV Lasix  20 mg today.    Hypokalemia, potassium was replenished.  Latest potassium of 4.3.  Hypomagnesemia, improved after replacement, latest level of 2.1   Lactic acidosis, lactate of 4.4 on presentation.  Nonseptic.  Likely secondary to metformin .   Chronic backache with left-sided sciatica MRI lumbar spine showed Severe  DDD L3-4 progressed since prior study.  Mild spinal stenosis with disc bulging and mild facet arthropathy.  Moderate facet arthropathy at L4-5 worsened since prior study, but at L5-S1 similar to prior study.  On analgesics for precaution PT OT evaluation small disc herniation and contact with right S1 nerve root is unchanged.  Complains of mild discomfort.  Essential HTN Currently in sinus rhythm.  Patient is on Coreg  Cardizem  lisinopril  and Eliquis  At home.  Will continue while in the hospital.  Diabetes mellitus type 2. Current sliding scale insulin.  Hold oral hypoglycemics.  Latest POC glucose of 115    History of liver cirrhosis, history of hep C No active issues at this time.   h/o MGUS, patient is not aware.  Recommend to follow with hematology as an outpatient    Psoriasis, patient is on CellCept  and tacrolimus topical lower extremity psoriasis during flareups. CellCept  on hold due to acute infection.   Depression, continued BuSpar , doxepin  and Effexor     Vitamin B12 level 357, goal >400.  Continue vitamin B 12 oral supplement   Folic acid level 6.1, at lower end.  Continue folic acid 1 mg p.o. daily.   DVT prophylaxis: apixaban  (ELIQUIS ) tablet 5 mg Start: 10/10/23 2200 apixaban  (ELIQUIS ) tablet 5 mg   Disposition: Home with home health likely in 1 to 2 days  Status is: Inpatient Remains inpatient appropriate because: IV fluids, pending clinical improvement, elevated CK level and trending up    Code Status:     Code Status: Full Code  Family  Communication: None at bedside  Consultants: None  Procedures: None  Anti-infectives:  Ceftriaxone -completed  Anti-infectives (From admission, onward)    Start     Dose/Rate Route Frequency Ordered Stop   10/11/23 1000  cefTRIAXone  (ROCEPHIN ) 1 g in sodium chloride  0.9 % 100 mL IVPB        1 g 200 mL/hr over 30 Minutes Intravenous Every 24 hours 10/10/23 1248 10/14/23 1018   10/10/23 1000  cefTRIAXone  (ROCEPHIN ) 1 g in  sodium chloride  0.9 % 100 mL IVPB        1 g 200 mL/hr over 30 Minutes Intravenous  Once 10/10/23 0950 10/10/23 1116      Subjective: Today, patient was seen and examined at bedside.  Patient complains of mild shortness of breath on exertion otherwise no dyspnea or shortness of breath.  Has been urinating well.  Complains of mild back pain.  Objective: Vitals:   10/13/23 2006 10/14/23 0621  BP: (!) 160/90 (!) 155/71  Pulse: 79 82  Resp: 17 16  Temp: 98.8 F (37.1 C) 98.4 F (36.9 C)  SpO2: 100% 99%    Intake/Output Summary (Last 24 hours) at 10/14/2023 1222 Last data filed at 10/14/2023 1100 Gross per 24 hour  Intake 3908.8 ml  Output 1625 ml  Net 2283.8 ml   Filed Weights   10/10/23 0720  Weight: 105.8 kg   Body mass index is 40.04 kg/m.   Physical Exam:  GENERAL: Patient is alert awake and oriented. Not in obvious distress.  Obese build. HENT: No scleral pallor or icterus. Pupils equally reactive to light. Oral mucosa is moist NECK: is supple, no gross swelling noted. CHEST: Clear to auscultation.  Diminished breath sounds bilaterally. CVS: S1 and S2 heard, no murmur. Regular rate and rhythm.  ABDOMEN: Soft, non-tender, bowel sounds are present. EXTREMITIES trace lower extremity edema. CNS: Cranial nerves are intact. No focal motor deficits. SKIN: warm and dry without rashes.  Data Review: I have personally reviewed the following laboratory data and studies,  CBC: Recent Labs  Lab 10/10/23 0742 10/11/23 0418 10/12/23 0509 10/13/23 0543  WBC 8.1 5.2 3.8* 3.6*  NEUTROABS 6.6  --   --   --   HGB 13.5 10.5* 9.9* 10.1*  HCT 44.6 33.6* 32.8* 35.0*  MCV 80.8 80.2 81.8 82.9  PLT 238 185 164 179   Basic Metabolic Panel: Recent Labs  Lab 10/10/23 0725 10/11/23 0418 10/11/23 0820 10/12/23 0509 10/13/23 0543  NA 145 144 146* 144 143  K 3.7 2.8* 3.1* 4.1 4.3  CL 108 111 112* 114* 113*  CO2 21* 23 23 22 22   GLUCOSE 146* 120* 117* 127* 102*  BUN 8 7* 7* 10  11  CREATININE 0.82 0.81 0.73 0.83 0.62  CALCIUM  9.5 7.9* 8.0* 7.9* 7.9*  MG  --  1.5*  --  2.4 2.1  PHOS  --  2.7  --  2.5 2.4*   Liver Function Tests: Recent Labs  Lab 10/10/23 0725 10/11/23 0418 10/12/23 0509 10/13/23 0543  AST 77* 82* 99* 158*  ALT 18 13 17 23   ALKPHOS 95 65 53 56  BILITOT 0.4 0.2 <0.2 <0.2  PROT 7.6 5.5* 5.7* 5.7*  ALBUMIN 4.1 3.2* 3.3* 3.2*   No results for input(s): LIPASE, AMYLASE in the last 168 hours. No results for input(s): AMMONIA in the last 168 hours. Cardiac Enzymes: Recent Labs  Lab 10/10/23 0742 10/11/23 0418 10/12/23 0509 10/13/23 0543 10/14/23 0418  CKTOTAL 3,325* 3,679* 4,382* 6,925* 7,714*   BNP (  last 3 results) No results for input(s): BNP in the last 8760 hours.  ProBNP (last 3 results) No results for input(s): PROBNP in the last 8760 hours.  CBG: Recent Labs  Lab 10/13/23 1120 10/13/23 1646 10/13/23 2001 10/14/23 0743 10/14/23 1120  GLUCAP 109* 112* 147* 105* 115*   Recent Results (from the past 240 hours)  Urine Culture (for pregnant, neutropenic or urologic patients or patients with an indwelling urinary catheter)     Status: None   Collection Time: 10/10/23  9:00 AM   Specimen: Urine, Clean Catch  Result Value Ref Range Status   Specimen Description   Final    URINE, CLEAN CATCH Performed at [facility], [address] [place], [place] [address]    Special Requests   Final    NONE Performed at [facility], [address], [place], [place] [address]    Culture   Final    NO GROWTH Performed at [facility], [address], [place], [place] [address]    Report Status 10/11/2023 FINAL  Final  Blood culture (routine x 2)     Status: None (Preliminary result)   Collection Time: 10/10/23  9:55 AM   Specimen: BLOOD  Result Value Ref Range Status   Specimen Description   Final    BLOOD BLOOD LEFT HAND Performed at [facility], [address], [place], [place] [address]    Special Requests   Final    BOTTLES DRAWN AEROBIC ONLY Blood Culture results may not be optimal due to an inadequate volume of blood received in culture bottles Performed at [facility], [address], [place], [place] [address]    Culture   Final    NO GROWTH 4 DAYS Performed at [facility], [address] [place], [place] [address]    Report Status PENDING  Incomplete  Blood culture (routine x 2)     Status: None (Preliminary result)   Collection Time: 10/10/23 10:17 AM   Specimen: BLOOD  Result Value Ref Range Status   Specimen Description   Final    BLOOD RIGHT ANTECUBITAL Performed at [facility], [address], [place], [place] [address]    Special Requests   Final    BOTTLES DRAWN AEROBIC AND ANAEROBIC Blood Culture results may not be optimal due to an inadequate volume of blood received in culture bottles Performed at [facility], [address], [place], [place] [address]    Culture   Final    NO GROWTH 4 DAYS Performed at [facility], [address] [place], [place] [address]    Report Status PENDING  Incomplete     Studies: No results found.   Deasha Clendenin, MD  [facility] 10/14/2023  If 7PM-7AM, please contact night-coverage

## 2023-10-14 NOTE — TOC Initial Note (Addendum)
 Transition of Care [facility]) - Initial/Assessment Note    Patient Details  Name: Stacy Campbell MRN: [id number] Date of Birth: 01-Oct-1955  Transition of Care [facility]) CM/SW Contact:    Heather DELENA Saltness, LCSW Phone Number: 10/14/2023, 12:56 PM  Clinical Narrative:                 CSW met with pt at bedside to discuss discharge planning. CSW advised pt of PT's recommendation for [facility] PT/OT upon discharge. Pt reports having HH services previously, but could not remember name of agency. Pt denies HH agency preference. Per chart review, pt previously receiving PT/OT/Aide services through [facility]. CSW sent referral to [facility] at [facility], who confirms ability to accept pt for services. Will need HH PT/OT/Aide orders. TOC will continue to follow.     Barriers to Discharge: Continued Medical Work up   Patient Goals and CMS Choice Patient states their goals for this hospitalization and ongoing recovery are:: To return home          Expected Discharge Plan and Services In-house Referral: Clinical Social Work Discharge Planning Services: NA Post Acute Care Choice: Home Health Living arrangements for the past 2 months: Single Family Home                 DME Arranged: N/A DME Agency: NA       HH Arranged: PT, OT HH Agency: [facility] Date HH Agency Contacted: 10/14/23 Time HH Agency Contacted: [phone] Representative spoke with at [facility] Agency: Arna  Prior Living Arrangements/Services Living arrangements for the past 2 months: Single Family Home Lives with:: Self Patient language and need for interpreter reviewed:: Yes Do you feel safe going back to the place where you live?: Yes      Need for Family Participation in Patient Care: Yes (Comment) Care giver support system in place?: Yes (comment)   Criminal Activity/Legal Involvement Pertinent to Current Situation/Hospitalization: No - Comment as needed  Activities of Daily Living   ADL Screening (condition at time of  admission) Independently performs ADLs?: Yes (appropriate for developmental age) Is the patient deaf or have difficulty hearing?: No Does the patient have difficulty seeing, even when wearing glasses/contacts?: Yes Does the patient have difficulty concentrating, remembering, or making decisions?: No  Permission Sought/Granted Permission sought to share information with : Case Manager Permission granted to share information with : Yes, Verbal Permission Granted     Permission granted to share info w AGENCY: [facility]  Emotional Assessment Appearance:: Appears stated age, Well-Groomed Attitude/Demeanor/Rapport: Engaged Affect (typically observed): Stable, Appropriate, Pleasant Orientation: : Oriented to Self, Oriented to Place, Oriented to  Time, Oriented to Situation Alcohol  / Substance Use: Not Applicable Psych Involvement: No (comment)  Admission diagnosis:  UTI (urinary tract infection) [N39.0] Elevated CK [R74.8] Fall, initial encounter [W19.XXXA] Urinary tract infection without hematuria, site unspecified [N39.0] Acute midline low back pain without sciatica [M54.50] Patient Active Problem List   Diagnosis Date Noted   UTI (urinary tract infection) 10/10/2023   Rhabdomyolysis 07/22/2023   Mixed simple and mucopurulent chronic bronchitis (HCC) 06/20/2022   Chronic narcotic dependence (HCC) 06/20/2022   Polypharmacy 06/20/2022   Obstructive sleep apnea hypopnea, mild 06/20/2022   Aortic ejection murmur 12/06/2021   Persistent atrial fibrillation (HCC) 10/04/2021   Secondary hypercoagulable state 10/04/2021   DJD (degenerative joint disease) of cervical spine 09/06/2021   Chronic urticaria 06/12/2021   Stricture of ureter 01/04/2020   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  01/04/2020   Degenerative joint disease (DJD) of lumbar spine 01/04/2020   Monoclonal gammopathy of unknown significance (MGUS) 01/23/2019   Controlled type 2 diabetes mellitus without  complication, without long-term current use of insulin (HCC) 12/12/2018   Hyperlipidemia associated with type 2 diabetes mellitus (HCC) 12/12/2018   Seasonal and perennial allergic rhinoconjunctivitis 02/12/2018   History of hepatitis C 10/11/2017   Former smoker 10/11/2017   Dyshidrotic eczema 09/10/2017   GERD (gastroesophageal reflux disease) 02/15/2017   Psoriasis 07/10/2015   Asthma with COPD (HCC) 05/25/2013   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   Morbid obesity    Major depression, recurrent, chronic 03/07/2006   Hypertension associated with diabetes (HCC) 03/07/2006   Chronic low back pain 03/07/2006   Insomnia 03/07/2006   PCP:  [name], MD Pharmacy:   [facility] [phone] - [place], [place] - [address] AT [facility] OF [address] [facility] [address] [place] [address] Phone: [phone] Fax: [phone]     Social Drivers of Health (SDOH) Social History: SDOH Screenings   Food Insecurity: Food Insecurity Present (10/10/2023)  Housing: Low Risk  (10/10/2023)  Transportation Needs: No Transportation Needs (10/10/2023)  Utilities: Not At Risk (10/10/2023)  Alcohol  Screen: Low Risk  (08/06/2023)  Depression (PHQ2-9): Low Risk  (09/30/2023)  Financial Resource Strain: Low Risk  (08/06/2023)  Physical Activity: Inactive (08/06/2023)  Social Connections: Moderately Isolated (10/10/2023)  Stress: No Stress Concern Present (08/06/2023)  Tobacco Use: Medium Risk (10/11/2023)  Health Literacy: Adequate Health Literacy (08/06/2023)   SDOH Interventions: None indicated     Readmission Risk Interventions    10/14/2023   12:54 PM 07/22/2023   12:26 PM  Readmission Risk Prevention Plan  Post Dischage Appt  Complete  Medication Screening  Complete  Transportation Screening Complete Complete  PCP or Specialist Appt within 3-5 Days Complete   HRI or Home Care Consult Complete   Social Work Consult for Recovery Care Planning/Counseling Complete    Palliative Care Screening Not Applicable   Medication Review Oceanographer) Complete     Signed: Heather Saltness, MSW, LCSW Clinical Social Worker Inpatient Care Management 10/14/2023 1:00 PM

## 2023-10-14 NOTE — Plan of Care (Signed)
   Problem: Coping: Goal: Ability to adjust to condition or change in health will improve Outcome: Progressing   Problem: Fluid Volume: Goal: Ability to maintain a balanced intake and output will improve Outcome: Progressing

## 2023-10-14 NOTE — Plan of Care (Signed)
  Problem: Education: Goal: Ability to describe self-care measures that may prevent or decrease complications (Diabetes Survival Skills Education) will improve Outcome: Progressing   Problem: Education: Goal: Knowledge of General Education information will improve Description: Including pain rating scale, medication(s)/side effects and non-pharmacologic comfort measures Outcome: Progressing

## 2023-10-15 ENCOUNTER — Other Ambulatory Visit

## 2023-10-15 DIAGNOSIS — N3000 Acute cystitis without hematuria: Secondary | ICD-10-CM | POA: Diagnosis not present

## 2023-10-15 LAB — CBC
HCT: 36.4 % (ref 36.0–46.0)
Hemoglobin: 10.6 g/dL — ABNORMAL LOW (ref 12.0–15.0)
MCH: 23.7 pg — ABNORMAL LOW (ref 26.0–34.0)
MCHC: 29.1 g/dL — ABNORMAL LOW (ref 30.0–36.0)
MCV: 81.3 fL (ref 80.0–100.0)
Platelets: 190 K/uL (ref 150–400)
RBC: 4.48 MIL/uL (ref 3.87–5.11)
RDW: 14.6 % (ref 11.5–15.5)
WBC: 3.7 K/uL — ABNORMAL LOW (ref 4.0–10.5)
nRBC: 0 % (ref 0.0–0.2)

## 2023-10-15 LAB — CULTURE, BLOOD (ROUTINE X 2)
Culture: NO GROWTH
Culture: NO GROWTH

## 2023-10-15 LAB — CK: Total CK: 4890 U/L — ABNORMAL HIGH (ref 38–234)

## 2023-10-15 LAB — BASIC METABOLIC PANEL WITH GFR
Anion gap: 8 (ref 5–15)
BUN: 8 mg/dL (ref 8–23)
CO2: 25 mmol/L (ref 22–32)
Calcium: 8.4 mg/dL — ABNORMAL LOW (ref 8.9–10.3)
Chloride: 109 mmol/L (ref 98–111)
Creatinine, Ser: 0.57 mg/dL (ref 0.44–1.00)
GFR, Estimated: >60 mL/min (ref >60)
Glucose, Bld: 101 mg/dL — ABNORMAL HIGH (ref 70–99)
Potassium: 4.2 mmol/L (ref 3.5–5.1)
Sodium: 141 mmol/L (ref 135–145)

## 2023-10-15 LAB — MAGNESIUM: Magnesium: 1.8 mg/dL (ref 1.7–2.4)

## 2023-10-15 LAB — GLUCOSE, CAPILLARY: Glucose-Capillary: 111 mg/dL — ABNORMAL HIGH (ref 70–99)

## 2023-10-15 MED ORDER — CYANOCOBALAMIN 1000 MCG PO TABS: 1000.0000 ug | ORAL_TABLET | Freq: Every day | ORAL | 0 refills | Status: AC

## 2023-10-15 MED ORDER — FOLIC ACID 1 MG PO TABS: 1.0000 mg | ORAL_TABLET | Freq: Every day | ORAL | 0 refills | Status: AC

## 2023-10-15 NOTE — TOC Transition Note (Signed)
 Transition of Care [facility]) - Discharge Note   Patient Details  Name: Stacy Campbell MRN: [id number] Date of Birth: May 04, 1955  Transition [facility]) CM/SW Contact:  Heather DELENA Saltness, LCSW Phone Number: 10/15/2023, 11:36 AM   Clinical Narrative:    Pt discharging home today. HH PT/OT/Aide services set up with Arna at [facility]. No DME needs. HH orders placed. No further TOC needs at this time.   Final next level of care: Home w Home Health Services Barriers to Discharge: Barriers Resolved   Patient Goals and CMS Choice Patient states their goals for this hospitalization and ongoing recovery are:: To return home        Discharge Placement  Home              Patient to be transferred to facility by: Family Name of family member notified: Patient Patient and family notified of of transfer: 10/15/23  Discharge Plan and Services Additional resources added to the After Visit Summary for  [facility] In-house Referral: Clinical Social Work Discharge Planning Services: NA Post Acute Care Choice: Home Health          DME Arranged: N/A DME Agency: NA       HH Arranged: PT, OT, Nurse's Aide HH Agency: [facility] Date HH Agency Contacted: 10/15/23 Time HH Agency Contacted: [phone] Representative spoke with at [facility] Agency: Arna  Social Drivers of Health (SDOH) Interventions SDOH Screenings   Food Insecurity: Food Insecurity Present (10/10/2023)  Housing: Low Risk  (10/10/2023)  Transportation Needs: No Transportation Needs (10/10/2023)  Utilities: Not At Risk (10/10/2023)  Alcohol  Screen: Low Risk  (08/06/2023)  Depression (PHQ2-9): Low Risk  (09/30/2023)  Financial Resource Strain: Low Risk  (08/06/2023)  Physical Activity: Inactive (08/06/2023)  Social Connections: Moderately Isolated (10/10/2023)  Stress: No Stress Concern Present (08/06/2023)  Tobacco Use: Medium Risk (10/11/2023)  Health Literacy: Adequate Health Literacy (08/06/2023)     Readmission Risk  Interventions    10/14/2023   12:54 PM 07/22/2023   12:26 PM  Readmission Risk Prevention Plan  Post Dischage Appt  Complete  Medication Screening  Complete  Transportation Screening Complete Complete  PCP or Specialist Appt within 3-5 Days Complete   HRI or Home Care Consult Complete   Social Work Consult for Recovery Care Planning/Counseling Complete   Palliative Care Screening Not Applicable   Medication Review Oceanographer) Complete      Signed: Heather Saltness, MSW, LCSW Clinical Social Worker Inpatient Care Management 10/15/2023 11:38 AM

## 2023-10-15 NOTE — Progress Notes (Signed)
 Discharge instructions discussed with patient, verbalized agreement and understanding

## 2023-10-15 NOTE — Discharge Summary (Signed)
 Physician Discharge Summary  Stacy Campbell FMW:[id number] DOB: 23-Oct-1955 DOA: 10/10/2023  PCP: [name], MD  Admit date: 10/10/2023 Discharge date: 10/15/2023  Admitted From: Home  Discharge disposition: home   Recommendations for Outpatient Follow-Up:   Follow up with your primary care provider in one week.  Check CBC, BMP, magnesium  in the next visit  Discharge Diagnosis:   Principal Problem:   UTI (urinary tract infection) Acute rhabdomyolysis  Discharge Condition: Improved.  Diet recommendation: Low sodium, heart healthy.  Carbohydrate-modified.    Wound care: None.  Code status: Full.   History of Present Illness:   Stacy Campbell is a 68 y.o. female with past medical history of A-fib on DOAC, hypertension, diabetes mellitus type 2, depression, liver cirrhosis, hep C, MGUS , COPD/asthma, psoriasis, chronic lower backache with left sciatica and difficulty walking and frequent falls presented to [name] ED with complaining of fall at home.  History of multiple falls. This time after the fall patient was on the floor laid on the floor for 3 hours until her brother found her in the morning and called ambulance.  Denied head trauma or loss of consciousness.  In the ED, patient was tachycardic and slightly hypertensive.  Lactic acid was elevated at 4.4.  CK level was elevated at 3325.  UA was positive for infection.  CBC without leukocytosis.  CT head scan C-spine right shoulder and x-ray of pelvis was negative for acute fracture including lumbar x-ray.  Patient was then considered for admission to hospital for further evaluation and treatment    Hospital Course:   Following conditions were addressed during hospitalization as listed below,  UTI (urinary tract infection) Urinalysis is abnormal.  Blood cultures negative in 4 days.  Completed Rocephin  3-day course.  Urine culture negative so far.    Urinary retention Initial ultrasound showed large  residual but improved to urinate on her own.    Rhabdomyolysis CK 3325> 3679 >4382> 7714 and has finally trended down to 4890.  Received IV fluids during hospitalization and will be increased to in take fluids at home.   Hypokalemia, potassium was replenished.  Latest potassium of 4.2   Hypomagnesemia, improved after replacement, latest level of 2.1   Lactic acidosis, lactate of 4.4 on presentation.  Nonseptic.  Likely secondary to metformin .   Chronic backache with left-sided sciatica MRI lumbar spine showed Severe DDD L3-4 progressed since prior study.  Mild spinal stenosis with disc bulging and mild facet arthropathy.  Moderate facet arthropathy at L4-5 worsened since prior study, but at L5-S1 similar to prior study.  On analgesics for precaution PT OT evaluation small disc herniation and contact with right S1 nerve root is unchanged.  Complains of mild discomfort.  Continue supportive care.   Essential HTN Currently in sinus rhythm.  Patient is on Coreg  Cardizem  lisinopril  and Eliquis  At home.  Will continue on discharge.   Diabetes mellitus type 2. On metformin  at home.  Continue diabetic diet.    History of liver cirrhosis, history of hep C No active issues at this time.   h/o MGUS, patient is not aware.  Recommend to follow with hematology as an outpatient    Psoriasis, patient is on CellCept  and tacrolimus topical lower extremity psoriasis during flareups.  CellCept  not found on the MAR at this time   Depression, continued BuSpar , doxepin  and Effexor     Vitamin B12 level 357, goal >400.  Continue vitamin B 12 oral supplement    Folic acid level  6.1, at lower end.  Continue folic acid 1 mg p.o. daily.   Disposition.  At this time, patient is stable for disposition home with outpatient PCP follow-up  Medical Consultants:   None.  Procedures:    None Subjective:   Today, patient was seen and examined at bedside.  Denies any nausea vomiting abdominal pain or dyspnea.   Has mild back pain at her baseline.  Discharge Exam:   Vitals:   10/14/23 2218 10/15/23 0619  BP: (!) 157/63 (!) 141/66  Pulse: 70 78  Resp:  17  Temp:  98 F (36.7 C)  SpO2:  97%   Vitals:   10/14/23 1331 10/14/23 2206 10/14/23 2218 10/15/23 0619  BP: 132/83 (!) 184/78 (!) 157/63 (!) 141/66  Pulse: 65 68 70 78  Resp: 18 16  17   Temp: 98.2 F (36.8 C) 98.2 F (36.8 C)  98 F (36.7 C)  TempSrc: Oral Oral  Oral  SpO2: 97% 100%  97%  Weight:      Height:       Body mass index is 40.04 kg/m.  General: Alert awake, not in obvious distress, obese built HENT: pupils equally reacting to light,  No scleral pallor or icterus noted. Oral mucosa is moist.  Chest:  Clear breath sounds.  Diminished breath sounds bilaterally CVS: S1 &S2 heard. No murmur.  Regular rate and rhythm. Abdomen: Soft, nontender, nondistended.  Bowel sounds are heard.   Extremities: No cyanosis, clubbing with trace lower extremity edema peripheral pulses are palpable. Psych: Alert, awake and oriented, normal mood CNS:  No cranial nerve deficits.  Power equal in all extremities.   Skin: Warm and dry.  No rashes noted.  The results of significant diagnostics from this hospitalization (including imaging, microbiology, ancillary and laboratory) are listed below for reference.     Diagnostic Studies:   MR LUMBAR SPINE WO CONTRAST Result Date: 10/11/2023 CLINICAL DATA:  Low back pain, progressive lower extremity weakness EXAM: MRI LUMBAR SPINE WITHOUT CONTRAST TECHNIQUE: Multiplanar, multisequence MR imaging of the lumbar spine was performed. No intravenous contrast was administered. COMPARISON:  December 07, 2015 FINDINGS: Bone marrow: There are Modic type 2 changes at L3-4 Conus and cauda equina: No significant abnormality Paraspinal tissues: No significant abnormality L1-L2: The disc is normal.  Mild facet arthropathy L2-L3: The disc is normal.  Mild facet arthropathy L3-L4: There is severe degenerative disc  disease with a broad-based disc bulge. There is mild facet enlargement. There is mild spinal stenosis. Moderate left neural foraminal stenosis due to lateral bulging of the disc. L4-L5: There is a mild disc bulge. There is moderate facet arthropathy. There is no spinal stenosis or foraminal stenosis L5-S1: There is mild degenerative disc disease with a mild disc bulge. There is moderate facet arthropathy. There is no spinal stenosis or significant foraminal stenosis. Small right paracentral disc herniation in contact with the right S1 nerve root. IMPRESSION: 1. There is severe degenerative disc disease at L3-4 which has progressed since the prior study. There is mild spinal stenosis due to the disc bulge and mild facet arthropathy 2. There is moderate facet arthropathy at L4-5 which has worsened since the prior study 3. There is moderate facet arthropathy at L5-S1 which is similar to the prior study. A small right paracentral disc herniation at L5-S1 and contact with the right S1 nerve root is unchanged compared with the prior study. Electronically Signed   By: Nancyann Burns M.D.   On: 10/11/2023 11:58   US  RENAL  Result Date: 10/10/2023 CLINICAL DATA:  UTI EXAM: RENAL / URINARY TRACT ULTRASOUND COMPLETE COMPARISON:  Ultrasound abdomen April 18, 2023 FINDINGS: Right Kidney: Renal measurements: 10x4.9 x 5 cm = volume: 128 mL. Echogenicity within normal limits. No mass or hydronephrosis visualized. Left Kidney: Renal measurements: 10.3 x 4.6 x 4.6 cm = volume: 111 mL. Echogenicity within normal limits. No mass or hydronephrosis visualized. Bladder: Grossly appears normal for degree of bladder distention with normal wall thickness. No intraluminal stone or mass lesion identified. Prevoid bladder volume measures 270 mL and on postvoid there is 121 mL remaining. Other: None. IMPRESSION: Large postvoid residual urine within the bladder. Electronically Signed   By: Megan  Zare M.D.   On: 10/10/2023 17:53   DG Pelvis 1-2  Views Result Date: 10/10/2023 CLINICAL DATA:  Pain after unwitnessed fall. EXAM: PELVIS - 1-2 VIEW COMPARISON:  July 22, 2023. FINDINGS: There is no evidence of pelvic fracture or diastasis. No pelvic bone lesions are seen. IMPRESSION: No acute abnormality seen. Electronically Signed   By: Lynwood Landy Raddle M.D.   On: 10/10/2023 11:39   DG Shoulder Right Result Date: 10/10/2023 CLINICAL DATA:  Right shoulder pain after unwitnessed fall. EXAM: RIGHT SHOULDER - 2+ VIEW COMPARISON:  None Available. FINDINGS: There is no evidence of fracture or dislocation. There is no evidence of arthropathy or other focal bone abnormality. Soft tissues are unremarkable. IMPRESSION: Negative. Electronically Signed   By: Lynwood Landy Raddle M.D.   On: 10/10/2023 11:38   DG Lumbar Spine Complete Result Date: 10/10/2023 CLINICAL DATA:  Unwitnessed fall. EXAM: LUMBAR SPINE - COMPLETE 4+ VIEW COMPARISON:  None Available. FINDINGS: No fracture or spondylolisthesis is noted. Moderate degenerative disc disease is noted at L3-4. Mild anterior osteophyte formation is noted at L2-3 and L4-5. Large osteophyte is seen laterally to the left at L3-4. IMPRESSION: Multilevel degenerative changes as described above. No acute abnormality seen. Electronically Signed   By: Lynwood Landy Raddle M.D.   On: 10/10/2023 11:37   CT Cervical Spine Wo Contrast Result Date: 10/10/2023 EXAM: CT CERVICAL SPINE WITHOUT CONTRAST 10/10/2023 08:16:18 AM TECHNIQUE: CT of the cervical spine was performed without the administration of intravenous contrast. Multiplanar reformatted images are provided for review. Automated exposure control, iterative reconstruction, and/or weight based adjustment of the mA/kV was utilized to reduce the radiation dose to as low as reasonably achievable. COMPARISON: Cervical spine series dated 09/05/2021. CLINICAL HISTORY: Neck trauma (Age >= 65y). Had fall @ 3am found on ground by son at 12. C/O of soreness in right shoulder soreness and leg  weakness. No LOC but there is dizziness. FINDINGS: CERVICAL SPINE: BONES AND ALIGNMENT: No acute fracture or traumatic malalignment. 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 There is slight degenerative anterolisthesis at C4-C5. DEGENERATIVE CHANGES: There is mild disc space narrowing at C4-C5, C5-C6 and C6-C7. SOFT TISSUES: No prevertebral soft tissue swelling. IMPRESSION: 1. No acute cervical spine abnormality. 2. 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 3. Mild cervical spondylosis with slight degenerative anterolisthesis at C4-5 and mild disc space narrowing at C4-5, C5-6, and C6-7. Electronically signed by: Evalene Coho MD 10/10/2023 08:22 AM EDT RP Workstation: [id number]   CT Head Wo Contrast Result Date: 10/10/2023 EXAM: CT HEAD WITHOUT CONTRAST 10/10/2023 08:16:18 AM TECHNIQUE: CT of the head was performed without the administration of intravenous contrast. Automated exposure control, iterative reconstruction, and/or weight based adjustment of the mA/kV was utilized to reduce the radiation dose to as  low as reasonably achievable. COMPARISON: CT of the head dated 07/22/2023. CLINICAL HISTORY: Head trauma, coagulopathy (Age 58-64y). Had fall @ 3am found on ground by son at 34. C/O of soreness in right shoulder soreness and leg weakness. No LOC but there is dizziness. FINDINGS: BRAIN AND VENTRICLES: No acute hemorrhage. No evidence of acute infarct. No hydrocephalus. No extra-axial collection. No mass effect or midline shift. There is a chronic lacunar infarct within the right thalamus. There is a small periventricular white matter disease. There is calcification within the carotid siphons. ORBITS: No acute abnormality. The patient is status post bilateral lens replacement. SINUSES: No acute abnormality. SOFT TISSUES AND SKULL: No acute soft tissue abnormality. No skull fracture. IMPRESSION: 1. No acute intracranial abnormality. 2. Chronic  right thalamic lacunar infarct and mild chronic microangiopathic white matter disease. Electronically signed by: Evalene Coho MD 10/10/2023 08:19 AM EDT RP Workstation: [id number]     Labs:   Basic Metabolic Panel: Recent Labs  Lab 10/11/23 0418 10/11/23 0820 10/12/23 0509 10/13/23 0543 10/15/23 0518  NA 144 146* 144 143 141  K 2.8* 3.1* 4.1 4.3 4.2  CL 111 112* 114* 113* 109  CO2 23 23 22 22 25   GLUCOSE 120* 117* 127* 102* 101*  BUN 7* 7* 10 11 8   CREATININE 0.81 0.73 0.83 0.62 0.57  CALCIUM  7.9* 8.0* 7.9* 7.9* 8.4*  MG 1.5*  --  2.4 2.1 1.8  PHOS 2.7  --  2.5 2.4*  --    GFR Estimated Creatinine Clearance: 79.8 mL/min (by C-G formula based on SCr of 0.57 mg/dL). Liver Function Tests: Recent Labs  Lab 10/10/23 0725 10/11/23 0418 10/12/23 0509 10/13/23 0543  AST 77* 82* 99* 158*  ALT 18 13 17 23   ALKPHOS 95 65 53 56  BILITOT 0.4 0.2 <0.2 <0.2  PROT 7.6 5.5* 5.7* 5.7*  ALBUMIN 4.1 3.2* 3.3* 3.2*   No results for input(s): LIPASE, AMYLASE in the last 168 hours. No results for input(s): AMMONIA in the last 168 hours. Coagulation profile No results for input(s): INR, PROTIME in the last 168 hours.  CBC: Recent Labs  Lab 10/10/23 0742 10/11/23 0418 10/12/23 0509 10/13/23 0543 10/15/23 0518  WBC 8.1 5.2 3.8* 3.6* 3.7*  NEUTROABS 6.6  --   --   --   --   HGB 13.5 10.5* 9.9* 10.1* 10.6*  HCT 44.6 33.6* 32.8* 35.0* 36.4  MCV 80.8 80.2 81.8 82.9 81.3  PLT 238 185 164 179 190   Cardiac Enzymes: Recent Labs  Lab 10/11/23 0418 10/12/23 0509 10/13/23 0543 10/14/23 0418 10/15/23 0518  CKTOTAL 3,679* 4,382* 6,925* 7,714* 4,890*   BNP: Invalid input(s): POCBNP CBG: Recent Labs  Lab 10/14/23 0743 10/14/23 1120 10/14/23 1620 10/14/23 2204 10/15/23 0730  GLUCAP 105* 115* 109* 106* 111*   D-Dimer No results for input(s): DDIMER in the last 72 hours. Hgb A1c No results for input(s): HGBA1C in the last 72 hours. Lipid Profile No  results for input(s): CHOL, HDL, LDLCALC, TRIG, CHOLHDL, LDLDIRECT in the last 72 hours. Thyroid  function studies No results for input(s): TSH, T4TOTAL, T3FREE, THYROIDAB in the last 72 hours.  Invalid input(s): FREET3 Anemia work up No results for input(s): VITAMINB12, FOLATE, FERRITIN, TIBC, IRON, RETICCTPCT in the last 72 hours. Microbiology Recent Results (from the past 240 hours)  Urine Culture (for pregnant, neutropenic or urologic patients or patients with an indwelling urinary catheter)     Status: None   Collection Time: 10/10/23  9:00 AM   Specimen: Urine,  Clean Catch  Result Value Ref Range Status   Specimen Description   Final    URINE, CLEAN CATCH Performed at [facility], [address], [place], [place] [address]    Special Requests   Final    NONE Performed at [facility], [address] [place], [place] [address]    Culture   Final    NO GROWTH Performed at [facility], [address] [place], [place] [address]    Report Status 10/11/2023 FINAL  Final  Blood culture (routine x 2)     Status: None   Collection Time: 10/10/23  9:55 AM   Specimen: BLOOD  Result Value Ref Range Status   Specimen Description   Final    BLOOD BLOOD LEFT HAND Performed at [facility], [address], [place], [place] [address]    Special Requests   Final    BOTTLES DRAWN AEROBIC ONLY Blood Culture results may not be optimal due to an inadequate volume of blood received in culture bottles Performed at [facility], [address] [place], [place] [address]    Culture   Final    NO GROWTH 5 DAYS Performed at [facility], [address] [place], [place] [address]    Report Status 10/15/2023 FINAL  Final  Blood culture (routine x 2)     Status: None   Collection Time: 10/10/23 10:17 AM   Specimen: BLOOD  Result Value Ref Range Status   Specimen  Description   Final    BLOOD RIGHT ANTECUBITAL Performed at [facility], [address], [place], [place] [address]    Special Requests   Final    BOTTLES DRAWN AEROBIC AND ANAEROBIC Blood Culture results may not be optimal due to an inadequate volume of blood received in culture bottles Performed at [facility], [address], [place], [place] [address]    Culture   Final    NO GROWTH 5 DAYS Performed at [facility], [address] [place], [place] [address]    Report Status 10/15/2023 FINAL  Final     Discharge Instructions:   Discharge Instructions     Diet - low sodium heart healthy   Complete by: As directed    Diet Carb Modified   Complete by: As directed    Discharge instructions   Complete by: As directed    Follow-up with your primary care provider in 1 week.  Increase fluid intake.  Seek medical attention for worsening symptoms.   Increase activity slowly   Complete by: As directed       Allergies as of 10/15/2023       Reactions   Alitraq Nausea And Vomiting   Hydrocodone  Itching   Linalool Other (See Comments)   Reaction not known   Methylisothiazolinone Other (See Comments)   Positive patch test   Propylene Glycol Other (See Comments)   Reaction not known   Aspirin  Itching, Other (See Comments)   Lower dose doesn't make her itch (she takes it every day)   Egg-derived Products Nausea Only, Other (See Comments)   No reaction if in another food   Hydrocodone -guaifenesin  Rash   Latex Rash   Pineapple Itching, Nausea Only   Tape Rash, Other (See Comments)   Medical tape(s)        Medication List     TAKE these medications    albuterol  108 (90 Base)  MCG/ACT inhaler Commonly known as: VENTOLIN  HFA Inhale 2 puffs into the lungs every 6 (six) hours as needed for wheezing or shortness of breath.   ARTHRITIS STRENGTH BC POWDER PO Take 1 packet by mouth 2 (two) times daily as needed (for pain).    aspirin  EC 81 MG tablet Take 81 mg by mouth in the morning.   busPIRone  15 MG tablet Commonly known as: BUSPAR  Take 15 mg by mouth 3 (three) times daily.   carvedilol  25 MG tablet Commonly known as: COREG  TAKE 1 TABLET(25 MG) BY MOUTH TWICE DAILY WITH A MEAL   clobetasol  ointment 0.05 % Commonly known as: TEMOVATE  Apply 1 Application topically 2 (two) times daily as needed.   cyanocobalamin  1000 MCG tablet Take 1 tablet (1,000 mcg total) by mouth daily.   desvenlafaxine 100 MG 24 hr tablet Commonly known as: PRISTIQ Take 100 mg by mouth daily.   diltiazem  360 MG 24 hr capsule Commonly known as: Cardizem  CD Take 1 capsule (360 mg total) by mouth daily.   doxepin  25 MG capsule Commonly known as: SINEQUAN  Take 50 mg by mouth 2 (two) times daily.   Eliquis  5 MG Tabs tablet Generic drug: apixaban  TAKE 1 TABLET(5 MG) BY MOUTH TWICE DAILY What changed: See the new instructions.   fluconazole 200 MG tablet Commonly known as: DIFLUCAN Take 200 mg by mouth every Monday.   folic acid 1 MG tablet Commonly known as: FOLVITE Take 1 tablet (1 mg total) by mouth daily.   furosemide  20 MG tablet Commonly known as: LASIX  Take 1 tablet (20 mg total) by mouth daily as needed for edema. Take with potassium What changed:  reasons to take this additional instructions   lisinopril  10 MG tablet Commonly known as: ZESTRIL  Take 1 tablet (10 mg total) by mouth daily.   metFORMIN  500 MG 24 hr tablet Commonly known as: GLUCOPHAGE -XR Take 1 tablet (500 mg total) by mouth daily with breakfast.   montelukast  10 MG tablet Commonly known as: SINGULAIR  Take 10 mg by mouth at bedtime.   mupirocin  ointment 2 % Commonly known as: BACTROBAN  Use twice a day for 7 days as needed for skin infections.   mycophenolate  500 MG tablet Commonly known as: CELLCEPT  Take 500 mg by mouth 2 (two) times daily.   nystatin  ointment Commonly known as: MYCOSTATIN  Apply 1 Application topically 2 (two)  times daily as needed (for yeast).   oxyCODONE -acetaminophen  10-325 MG tablet Commonly known as: PERCOCET  Take 1 tablet by mouth 5 (five) times daily as needed for pain.   potassium chloride  SA 20 MEQ tablet Commonly known as: KLOR-CON  M Take 1 tablet (20 mEq total) by mouth daily as needed (leg swelling). Take with furosemide  What changed:  reasons to take this additional instructions   promethazine  12.5 MG tablet Commonly known as: PHENERGAN  Take 12.5 mg by mouth every 12 (twelve) hours as needed for vomiting or nausea.   rosuvastatin  10 MG tablet Commonly known as: CRESTOR  Take 1 tablet (10 mg total) by mouth at bedtime. TAKE 1 TABLET(10 MG) BY MOUTH DAILY What changed:  when to take this additional instructions   tacrolimus 0.1 % ointment Commonly known as: PROTOPIC Apply 1 Application topically 2 (two) times daily as needed (for itching).   tirzepatide  7.5 MG/0.5ML Pen Commonly known as: MOUNJARO  Inject 7.5 mg into the skin once a week. What changed: when to take this   tiZANidine  4 MG tablet Commonly known as: ZANAFLEX  Take 4 mg by mouth 2 (two) times daily  as needed for muscle spasms.        Follow-up Information     [facility] Follow up.   Specialty: Home Health Services Why: This provider will begin Home Health PT/OT/Aide services after discharge. Contact information: [address] [place] [address] [phone]         [name], MD Follow up in 1 week(s).   Specialty: Family Medicine Contact information: [address] [place] [address] [phone]                  Time coordinating discharge: 39 minutes  Signed:  Riyad Keena  [facility] 10/15/2023, 9:37 AM

## 2023-10-15 NOTE — Plan of Care (Signed)
  Problem: Metabolic: Goal: Ability to maintain appropriate glucose levels will improve Outcome: Progressing   Problem: Nutritional: Goal: Maintenance of adequate nutrition will improve Outcome: Progressing

## 2023-10-16 ENCOUNTER — Telehealth: Payer: Self-pay | Admitting: *Deleted

## 2023-10-16 NOTE — Transitions of Care (Post Inpatient/ED Visit) (Signed)
   10/16/2023  Name: Stacy Campbell MRN: [id number] DOB: [birth year]  Today's TOC FU Call Status: Today's TOC FU Call Status:: Unsuccessful Call (1st Attempt) Unsuccessful Call (1st Attempt) Date: 10/16/23  Attempted to reach the patient regarding the most recent Inpatient/ED visit.  Follow Up Plan: Additional outreach attempts will be made to reach the patient to complete the Transitions of Care (Post Inpatient/ED visit) call.   Mliss Creed [facility], BSN RN Care Manager/ Transition of Care [place]/ [facility] [phone]

## 2023-10-17 ENCOUNTER — Telehealth: Payer: Self-pay | Admitting: *Deleted

## 2023-10-17 NOTE — Transitions of Care (Post Inpatient/ED Visit) (Signed)
 10/17/2023  Name: Stacy Campbell MRN: [id number] DOB: 12/01/1955  Today's TOC FU Call Status: Today's TOC FU Call Status:: Successful TOC FU Call Completed TOC FU Call Complete Date: 10/17/23 Patient's Name and Date of Birth confirmed.  Transition Care Management Follow-up Telephone Call Date of Discharge: 10/15/23 Discharge Facility: [name]) Type of Discharge: Inpatient Admission Primary Inpatient Discharge Diagnosis:: UTI (urinary tract infection) How have you been since you were released from the hospital?:  (eating, drinking well, ambulating with walker)  Items Reviewed: Did you receive and understand the discharge instructions provided?: Yes Medications obtained,verified, and reconciled?: Yes (Medications Reviewed) Any new allergies since your discharge?: No Dietary orders reviewed?: Yes Type of Diet Ordered:: heart healthy, carbohydrate modified Do you have support at home?: Yes People in Home [RPT]:  (pt does not state who is in the home with her) Name of Support/Comfort Primary Source: pt states she has help, does not share name Reviewed signs/ symptoms UTI, reportable signs/ symptoms  pt states UTI has resolved  Medications Reviewed Today: Medications Reviewed Today     Reviewed by Aura Mliss LABOR, RN (Registered Nurse) on 10/17/23 at 1410  Med List Status: <None>   Medication Order Taking? Sig Documenting Provider Last Dose Status Informant  albuterol  (VENTOLIN  HFA) 108 (90 Base) MCG/ACT inhaler [id number] Yes Inhale 2 puffs into the lungs every 6 (six) hours as needed for wheezing or shortness of breath. [name], MD  Active Self           Med Note MARISA, NATHANEL LOISE Schaumann Oct 10, 2023  2:04 PM)    aspirin  EC 81 MG tablet [id number]  Take 81 mg by mouth in the morning.  Patient not taking: Reported on 10/17/2023   [provider]  Active Self  Aspirin -Salicylamide-Caffeine (ARTHRITIS STRENGTH BC POWDER PO) [id number] Yes Take 1 packet by mouth  2 (two) times daily as needed (for pain). [provider]  Active Self  busPIRone  (BUSPAR ) 15 MG tablet [id number] Yes Take 15 mg by mouth 3 (three) times daily. [provider]  Active Self  carvedilol  (COREG ) 25 MG tablet [id number] Yes TAKE 1 TABLET(25 MG) BY MOUTH TWICE DAILY WITH A MEAL [name], MD  Active Self           Med Note MARISA, NATHANEL LOISE Schaumann Oct 10, 2023  3:02 PM) PROVIDER: The patient said she currently takes this, but I cannot see a recent fill of it.  clobetasol  ointment (TEMOVATE ) 0.05 % [id number] Yes Apply 1 Application topically 2 (two) times daily as needed. [provider]  Active Self  cyanocobalamin  1000 MCG tablet [id number] Yes Take 1 tablet (1,000 mcg total) by mouth daily. Pokhrel, Laxman, MD  Active   desvenlafaxine (PRISTIQ) 100 MG 24 hr tablet [id number] Yes Take 100 mg by mouth daily. [provider]  Active Self  diltiazem  (CARDIZEM  CD) 360 MG 24 hr capsule [id number] Yes Take 1 capsule (360 mg total) by mouth daily. Anner Alm ORN, MD  Active Self  doxepin  (SINEQUAN ) 25 MG capsule [id number] Yes Take 50 mg by mouth 2 (two) times daily. [provider]  Active Self  ELIQUIS  5 MG TABS tablet [id number] Yes TAKE 1 TABLET(5 MG) BY MOUTH TWICE DAILY Anner Alm ORN, MD  Active Self  fluconazole (DIFLUCAN) 200 MG tablet [id number] Yes Take 200 mg by mouth every Monday. [provider]  Active Self  folic acid (FOLVITE) 1 MG tablet [id number] Yes Take  1 tablet (1 mg total) by mouth daily. Pokhrel, Laxman, MD  Active   furosemide  (LASIX ) 20 MG tablet [id number] Yes Take 1 tablet (20 mg total) by mouth daily as needed for edema. Take with potassium Andy, Camille L, MD  Active Self  lisinopril  (ZESTRIL ) 10 MG tablet [id number] Yes Take 1 tablet (10 mg total) by mouth daily. [name], MD  Active Self  metFORMIN  (GLUCOPHAGE -XR) 500 MG 24 hr tablet [id number] Yes Take 1 tablet (500 mg total) by mouth daily with breakfast.  [name], MD  Active Self  montelukast  (SINGULAIR ) 10 MG tablet [id number] Yes Take 10 mg by mouth at bedtime. [provider]  Active   mycophenolate  (CELLCEPT ) 500 MG tablet [id number] Yes Take 500 mg by mouth 2 (two) times daily. [provider]  Active Self           Med Note MARISA, NATHANEL LOISE Schaumann Oct 10, 2023  2:49 PM) PROVIDER: The patient said she takes this 2 times a day, but I cannot see (that) it has been filled since 02/2023- for #120 tablets. She said a dermatologist in [place] originally prescribed this. I asked twice about it.  nystatin  ointment (MYCOSTATIN ) [id number] Yes Apply 1 Application topically 2 (two) times daily as needed (for yeast). [provider]  Active Self  oxyCODONE -acetaminophen  (PERCOCET ) 10-325 MG tablet [id number] Yes Take 1 tablet by mouth 5 (five) times daily as needed for pain. [provider]  Active Self  potassium chloride  SA (KLOR-CON  M) 20 MEQ tablet [id number] Yes Take 1 tablet (20 mEq total) by mouth daily as needed (leg swelling). Take with furosemide  [name], MD  Active Self  promethazine  (PHENERGAN ) 12.5 MG tablet [id number] Yes Take 12.5 mg by mouth every 12 (twelve) hours as needed for vomiting or nausea. [provider]  Active Self  rosuvastatin  (CRESTOR ) 10 MG tablet [id number] Yes Take 1 tablet (10 mg total) by mouth at bedtime. TAKE 1 TABLET(10 MG) BY MOUTH DAILY [name], MD  Active Self  tacrolimus (PROTOPIC) 0.1 % ointment [id number] Yes Apply 1 Application topically 2 (two) times daily as needed (for itching). [provider]  Active Self  tirzepatide  (MOUNJARO ) 7.5 MG/0.5ML Pen [id number] Yes Inject 7.5 mg into the skin once a week. [name], MD  Active Self           Med Note MARISA, NATHANEL LOISE Schaumann Oct 10, 2023  2:05 PM)    tiZANidine  (ZANAFLEX ) 4 MG tablet [id number] Yes Take 4 mg by mouth 2 (two) times daily as needed for muscle spasms. [provider]  Active Self             Home Care and Equipment/Supplies: Were Home Health Services Ordered?: Yes Name of Home Health Agency:: [facility] Has Agency set up a time to come to your home?: Yes First Home Health Visit Date: 10/18/23 Any new equipment or medical supplies ordered?: No  Functional Questionnaire: Do you need assistance with bathing/showering or dressing?: No Do you need assistance with meal preparation?: No Do you need assistance with eating?: No Do you have difficulty maintaining continence: No Do you need assistance with getting out of bed/getting out of a chair/moving?: Yes (walker) Do you have difficulty managing or taking your medications?: No  Follow up appointments reviewed: PCP Follow-up appointment confirmed?: No (pt deciles for RN CM to schedule post hospital follow up, pt states she will call and schedule appointment) MD Provider Line  [phone] Given: No Specialist Hospital Follow-up appointment confirmed?: NA Do you need transportation to your follow-up appointment?: No Do you understand care options if your condition(s) worsen?: Yes-patient verbalized understanding RN CM reviewed importance of post hospital follow up including lab work within one week  Mliss Creed [facility], BSN RN Care Manager/ Transition of Care [place]/ [facility] [phone]

## 2023-10-18 ENCOUNTER — Other Ambulatory Visit: Payer: Self-pay | Admitting: Cardiology

## 2023-10-18 DIAGNOSIS — I4819 Other persistent atrial fibrillation: Secondary | ICD-10-CM

## 2023-10-18 NOTE — Telephone Encounter (Signed)
 Eliquis  5mg  refill request received. Patient is 68 years old, weight-105.8kg, Crea-0.57 on 10/15/23, Diagnosis-Afib, and last seen by Dr. Anner on 05/14/23. Dose is appropriate based on dosing criteria. Will send in refill to requested pharmacy.

## 2023-10-21 ENCOUNTER — Ambulatory Visit: Admitting: Family Medicine

## 2023-10-21 ENCOUNTER — Ambulatory Visit: Payer: Self-pay | Admitting: Family Medicine

## 2023-10-21 ENCOUNTER — Encounter: Payer: Self-pay | Admitting: Family Medicine

## 2023-10-21 ENCOUNTER — Telehealth: Payer: Self-pay | Admitting: Cardiology

## 2023-10-21 VITALS — BP 200/60 | HR 79 | Temp 97.9°F | Ht 64.0 in | Wt 236.4 lb

## 2023-10-21 DIAGNOSIS — I152 Hypertension secondary to endocrine disorders: Secondary | ICD-10-CM

## 2023-10-21 DIAGNOSIS — K7469 Other cirrhosis of liver: Secondary | ICD-10-CM

## 2023-10-21 DIAGNOSIS — R296 Repeated falls: Secondary | ICD-10-CM

## 2023-10-21 DIAGNOSIS — F1120 Opioid dependence, uncomplicated: Secondary | ICD-10-CM

## 2023-10-21 DIAGNOSIS — E119 Type 2 diabetes mellitus without complications: Secondary | ICD-10-CM

## 2023-10-21 DIAGNOSIS — F339 Major depressive disorder, recurrent, unspecified: Secondary | ICD-10-CM

## 2023-10-21 DIAGNOSIS — E876 Hypokalemia: Secondary | ICD-10-CM

## 2023-10-21 DIAGNOSIS — E1159 Type 2 diabetes mellitus with other circulatory complications: Secondary | ICD-10-CM | POA: Diagnosis not present

## 2023-10-21 DIAGNOSIS — T796XXD Traumatic ischemia of muscle, subsequent encounter: Secondary | ICD-10-CM | POA: Diagnosis not present

## 2023-10-21 DIAGNOSIS — I4819 Other persistent atrial fibrillation: Secondary | ICD-10-CM

## 2023-10-21 LAB — BASIC METABOLIC PANEL WITH GFR
BUN: 8 mg/dL (ref 6–23)
CO2: 25 meq/L (ref 19–32)
Calcium: 8.8 mg/dL (ref 8.4–10.5)
Chloride: 106 meq/L (ref 96–112)
Creatinine, Ser: 0.86 mg/dL (ref 0.40–1.20)
GFR: 69.49 mL/min (ref >60.00)
Glucose, Bld: 135 mg/dL — ABNORMAL HIGH (ref 70–99)
Potassium: 3.7 meq/L (ref 3.5–5.1)
Sodium: 142 meq/L (ref 135–145)

## 2023-10-21 LAB — MAGNESIUM: Magnesium: 1.5 mg/dL (ref 1.5–2.5)

## 2023-10-21 LAB — CBC WITH DIFFERENTIAL/PLATELET
Basophils Absolute: 0 K/uL (ref 0.0–0.1)
Basophils Relative: 0.7 % (ref 0.0–3.0)
Eosinophils Absolute: 0.2 K/uL (ref 0.0–0.7)
Eosinophils Relative: 5.1 % — ABNORMAL HIGH (ref 0.0–5.0)
HCT: 39.5 % (ref 36.0–46.0)
Hemoglobin: 12.4 g/dL (ref 12.0–15.0)
Lymphocytes Relative: 20.8 % (ref 12.0–46.0)
Lymphs Abs: 0.8 K/uL (ref 0.7–4.0)
MCHC: 31.4 g/dL (ref 30.0–36.0)
MCV: 77.5 fl — ABNORMAL LOW (ref 78.0–100.0)
Monocytes Absolute: 0.3 K/uL (ref 0.1–1.0)
Monocytes Relative: 8.3 % (ref 3.0–12.0)
Neutro Abs: 2.7 K/uL (ref 1.4–7.7)
Neutrophils Relative %: 65.1 % (ref 43.0–77.0)
Platelets: 205 K/uL (ref 150.0–400.0)
RBC: 5.09 Mil/uL (ref 3.87–5.11)
RDW: 15.4 % (ref 11.5–15.5)
WBC: 4.1 K/uL (ref 4.0–10.5)

## 2023-10-21 LAB — CK: Total CK: 353 U/L — ABNORMAL HIGH (ref 17–177)

## 2023-10-21 MED ORDER — DILTIAZEM HCL ER COATED BEADS 360 MG PO CP24: 360.0000 mg | ORAL_CAPSULE | Freq: Every day | ORAL | 2 refills | Status: AC

## 2023-10-21 NOTE — Patient Instructions (Signed)
 Please follow up as scheduled for your next visit with me: 12/30/2023   If you have any questions or concerns, please don't hesitate to send me a message via MyChart or call the office at [phone]. Thank you for visiting with us  today! It's our pleasure caring for you.   Please schedule an appointment with Dr. Genice office for blood pressure management ASAP; need to get dilitazem prescription filled.   Start checking BP: go to ER for any severe headaches, confusion or chest pain OR if your BP remains too high: > 200 systolic.

## 2023-10-21 NOTE — Telephone Encounter (Unsigned)
 Copied from CRM [id number]. Topic: Clinical - Medication Refill >> Oct 21, 2023  4:47 PM Harlene ORN wrote: Medication: carvedilol  (COREG ) 25 MG tablet  Has the patient contacted their pharmacy? Yes (Agent: If no, request that the patient contact the pharmacy for the refill. If patient does not wish to contact the pharmacy document the reason why and proceed with request.) (Agent: If yes, when and what did the pharmacy advise?)  This is the patient's preferred pharmacy:  [facility] [phone] - [place], [place] - [address] AT [facility] [address] [place] [address] Phone: [phone] Fax: [phone]  Is this the correct pharmacy for this prescription? Yes If no, delete pharmacy and type the correct one.   Has the prescription been filled recently? No  Is the patient out of the medication? Yes  Has the patient been seen for an appointment in the last year OR does the patient have an upcoming appointment? Yes  Can we respond through MyChart? Yes  Agent: Please be advised that Rx refills may take up to 3 business days. We ask that you follow-up with your pharmacy.

## 2023-10-21 NOTE — Progress Notes (Signed)
 Call patient; her lab results look better overall.  Still need to drink plenty of fluids; CK levels are almost back to normal.  Blood counts and kidney function are improved.  Please start OTC magnesium  oxide 400 units daily to keep the magnesium  levels up.

## 2023-10-21 NOTE — Telephone Encounter (Signed)
 Patient reports she has been out of her carvedilol  and diltiazem  for a couple weeks now. Her BP was elevated at PCP visit this morning: 200/60.  Patient rechecked her BP this afternoon: Most recent: 116/76, HR 91 Earlier this afternoon: 190/11, HR 78  Patient reports having a mild headache today. Denies chest pain, SOB or blurred vision.  Carvedilol  Rx managed by PCP, advised patient contact her PCP's office to request a refill on this medication.  Diltiazem  Rx sent to [facility] pharmacy.  Patient has appt scheduled with APP on 10/28/23.

## 2023-10-21 NOTE — Telephone Encounter (Signed)
 Pt c/o BP issue: STAT if pt c/o blurred vision, one-sided weakness or slurred speech.  STAT if BP is GREATER than 180/120 TODAY.  STAT if BP is LESS than 90/60 and SYMPTOMATIC TODAY  1. What is your BP concern? High BP  2. Have you taken any BP medication today?   lisinopril  (ZESTRIL ) 10 MG tablet   Patient is out of Carvedilol   3. What are your last 5 BP readings?Patient does not have a BP monitor, but at her PCP visit this morning it was 200/60  4. Are you having any other symptoms (ex. Dizziness, headache, blurred vision, passed out)? No   Patient has been in the hospital for a fall she had recently. When she went to the hospital, her BP was up and down.  Spoke with RN Elveria from triage, was informed to send this as a phone encounter due to the patient not being symptomatic. Please advise.

## 2023-10-21 NOTE — Progress Notes (Signed)
 Subjective  CC:  Chief Complaint  Patient presents with   Hospitalization Follow-up    10/10/2023 - 10/15/2023 (5 days) [facility]  UTI (urinary tract infection) Principal problem  Pt stated that she is still feeling a little weak since the hospital.    HPI: Stacy Campbell is a 68 y.o. female who presents to the office today to address the problems listed above in the chief complaint. 68 year old female who was hospitalized after multiple trials for rhabdomyolysis.  This was traumatic.  She was lying on the floor for several hours prior to her hospitalization.  Workup revealed an acute UTI.  It seems they thought that she had sort of a metabolic encephalopathy from a UTI, she became weak, dehydrated and had multiple falls.  Patient says she does not remember how she landed on the floor.  Hospital course was remarkable for IV Rocephin  for 3 days, rhabdomyolysis treated with IV fluids, mild renal insufficiency which improved.  She also has hypertension, diabetes depression and chronic narcotic dependence.  Potassium was low on admission and this was supplemented.  She is due for follow-up labs to ensure things are stable. She reports that she is feeling better.  She is quite emotional. We reviewed her medication list.  There seems to be some confusion.  She is not sure if she is taking her diltiazem .  She is on her Lasix , potassium, lisinopril , carvedilol  twice a day.  Her blood pressure is very elevated in the office today.  She says that she is in pain.  Her knees hurt from the fall as does her chronic back pain.  She is on chronic narcotic dependence. She has a cardiologist.  She is on Eliquis .  This is for persistent A-fib.  She denies palpitations or chest pains.  She has no shortness of breath at rest.  She does get winded with activity.  Her lifestyle is sedentary due to her chronic pain. She does have cirrhosis of the liver.  Enzymes of the liver were mildly elevated upon  discharge.  Assessment  1. Traumatic rhabdomyolysis, subsequent encounter   2. Hypertension associated with diabetes (HCC)   3. Controlled type 2 diabetes mellitus without complication, without long-term current use of insulin (HCC)   4. Major depression, recurrent, chronic   5. Multiple falls   6. Hypokalemia   7. Chronic narcotic dependence (HCC)   8. Other cirrhosis of liver (HCC)   9. Persistent atrial fibrillation (HCC)      Plan   Hypertension f/u: I reviewed notes.  Blood pressure was only mildly elevated while she was in the hospital.  Systolic is very elevated today.  This could be in part due to pain response.  We went over her medications.  She is to go to the pharmacy and ensure that she has all of her blood pressure medications and take diltiazem  and she has not been taking it.  I want her to see Dr. Anner for cardiology to follow-up on her heart medications and blood pressure medications.  She will continue to check blood pressures at home and go to the emergency room for any headache, chest pain, confusion or if systolic blood pressures remain greater than 200.  Patient understands and agrees with care plan.  She understands the risks of very elevated blood pressures. I will recheck her magnesium , potassium, kidney function today.  Will repeat her blood counts. Depression is active.  She continues on her antidepressants.  She has a long history of  chronic depression. High risk patient, lives alone. Thank you Education regarding management of these chronic disease states was given. Management strategies discussed on successive visits include dietary and exercise recommendations, goals of achieving and maintaining IBW, and lifestyle modifications aiming for adequate sleep and minimizing stressors.   Follow up: as scheduled  Orders Placed This Encounter  Procedures   Basic metabolic panel with GFR   CBC with Differential/Platelet   Magnesium    CK   No orders of the  defined types were placed in this encounter.     BP Readings from Last 3 Encounters:  10/21/23 (!) 200/60  10/15/23 (!) 141/66  09/30/23 (!) 148/79   Wt Readings from Last 3 Encounters:  10/21/23 236 lb 6.4 oz (107.2 kg)  10/10/23 233 lb 4 oz (105.8 kg)  09/30/23 233 lb 6.4 oz (105.9 kg)    Lab Results  Component Value Date   CHOL 126 06/27/2023   CHOL 108 06/20/2022   CHOL 102 02/24/2021   Lab Results  Component Value Date   HDL 42.50 06/27/2023   HDL 44.30 06/20/2022   HDL 36.30 (L) 02/24/2021   Lab Results  Component Value Date   LDLCALC 58 06/27/2023   LDLCALC 42 06/20/2022   LDLCALC 27 02/24/2021   Lab Results  Component Value Date   TRIG 128.0 06/27/2023   TRIG 111.0 06/20/2022   TRIG 194.0 (H) 02/24/2021   Lab Results  Component Value Date   CHOLHDL 3 06/27/2023   CHOLHDL 2 06/20/2022   CHOLHDL 3 02/24/2021   No results found for: LDLDIRECT Lab Results  Component Value Date   CREATININE 0.57 10/15/2023   BUN 8 10/15/2023   NA 141 10/15/2023   K 4.2 10/15/2023   CL 109 10/15/2023   CO2 25 10/15/2023    The ASCVD Risk score (Arnett DK, et al., 2019) failed to calculate for the following reasons:   The valid total cholesterol range is 130 to 320 mg/dL  I reviewed the patients updated PMH, FH, and SocHx.    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 01/04/2020    Priority: High   Monoclonal gammopathy of unknown significance (MGUS) 01/23/2019    Priority: High   Controlled type 2 diabetes mellitus without complication, without long-term current use of insulin (HCC)  12/12/2018    Priority: High   Hyperlipidemia associated with type 2 diabetes mellitus (HCC) 12/12/2018    Priority: High   Asthma with COPD (HCC) 05/25/2013    Priority: High   Morbid obesity     Priority: High   Major depression, recurrent, chronic 03/07/2006    Priority: High   Hypertension associated with diabetes (HCC) 03/07/2006    Priority: High   Chronic low back pain 03/07/2006    Priority: High   Insomnia 03/07/2006    Priority: High   Obstructive sleep apnea hypopnea, mild 06/20/2022    Priority: Medium    Aortic ejection murmur 12/06/2021    Priority: Medium    DJD (degenerative joint disease) of cervical spine 09/06/2021    Priority: Medium    Chronic  urticaria 06/12/2021    Priority: Medium    Degenerative joint disease (DJD) of lumbar spine 01/04/2020    Priority: Medium    History of hepatitis C 10/11/2017    Priority: Medium    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 Her smoking use included cigarettes. She started smoking about 52 years ago. She has a 9.3 pack-year smoking history. She has never used smokeless tobacco. She reports that she does not drink alcohol  and does not use drugs.  Current Meds  Medication Sig   albuterol  (VENTOLIN  HFA) 108 (90 Base) MCG/ACT inhaler Inhale 2 puffs into  the lungs every 6 (six) hours as needed for wheezing or shortness of breath.   aspirin  EC 81 MG tablet Take 81 mg by mouth in the morning.   Aspirin -Salicylamide-Caffeine (ARTHRITIS STRENGTH BC POWDER PO) Take 1 packet by mouth 2 (two) times daily as needed (for pain).   busPIRone  (BUSPAR ) 15 MG tablet Take 15 mg by mouth 3 (three) times daily.   carvedilol  (COREG ) 25 MG tablet TAKE 1 TABLET(25 MG) BY MOUTH TWICE DAILY WITH A MEAL   clobetasol  ointment (TEMOVATE ) 0.05 % Apply 1 Application topically 2 (two) times daily as needed.   cyanocobalamin  1000 MCG tablet Take 1 tablet (1,000 mcg total) by mouth daily.   desvenlafaxine (PRISTIQ) 100 MG 24 hr tablet Take 100 mg by mouth daily.   diltiazem  (CARDIZEM  CD) 360 MG 24 hr capsule Take 1 capsule (360 mg total) by mouth daily.   doxepin  (SINEQUAN ) 25 MG capsule Take 50 mg by mouth 2 (two) times daily.   ELIQUIS  5 MG TABS tablet TAKE 1 TABLET(5 MG) BY MOUTH TWICE DAILY   fluconazole (DIFLUCAN) 200 MG tablet Take 200 mg by mouth every Monday.   furosemide  (LASIX ) 20 MG tablet Take 1 tablet (20 mg total) by mouth daily as needed for edema. Take with potassium   lisinopril  (ZESTRIL ) 10 MG tablet Take 1 tablet (10 mg total) by mouth daily.   metFORMIN  (GLUCOPHAGE -XR) 500 MG 24 hr tablet Take 1 tablet (500 mg total) by mouth daily with breakfast.   montelukast  (SINGULAIR ) 10 MG tablet Take 10 mg by mouth at bedtime.   mycophenolate  (CELLCEPT ) 500 MG tablet Take 500 mg by mouth 2 (two) times daily.   nystatin  ointment (MYCOSTATIN ) Apply 1 Application topically 2 (two) times daily as needed (for yeast).   oxyCODONE -acetaminophen  (PERCOCET ) 10-325 MG tablet Take 1 tablet by mouth 5 (five) times daily as needed for pain.   potassium chloride  SA (KLOR-CON  M) 20 MEQ tablet Take 1 tablet (20 mEq total) by mouth daily as needed (leg swelling). Take with furosemide    promethazine  (PHENERGAN ) 12.5 MG tablet Take 12.5 mg by mouth every 12 (twelve) hours as needed  for vomiting or nausea.   rosuvastatin  (CRESTOR ) 10 MG tablet Take 1 tablet (10 mg total) by mouth at  bedtime. TAKE 1 TABLET(10 MG) BY MOUTH DAILY   tacrolimus (PROTOPIC) 0.1 % ointment Apply 1 Application topically 2 (two) times daily as needed (for itching).   tirzepatide  (MOUNJARO ) 7.5 MG/0.5ML Pen Inject 7.5 mg into the skin once a week.   tiZANidine  (ZANAFLEX ) 4 MG tablet Take 4 mg by mouth 2 (two) times daily as needed for muscle spasms.    Review of Systems: Cardiovascular: negative for chest pain, palpitations, leg swelling, orthopnea Respiratory: negative for SOB, wheezing or persistent cough Gastrointestinal: negative for abdominal pain Genitourinary: negative for dysuria or gross hematuria  Objective  Vitals: BP (!) 200/60   Pulse 79   Temp 97.9 F (36.6 C)   Ht 5' 4 (1.626 m)   Wt 236 lb 6.4 oz (107.2 kg)   SpO2 99%   BMI 40.58 kg/m  General: no acute distress, alert and oriented x 3.  Tearful during visit today. Psych:  Alert and oriented, normal mood and affect HEENT:  Normocephalic, atraumatic, supple neck  Cardiovascular: Irregularly irregular no edema Respiratory:  Good breath sounds bilaterally, CTAB with normal respiratory effort Skin:  Warm, no rashes Neurologic:   Mental status is normal  Lab Results  Component Value Date   NA 141 10/15/2023   CL 109 10/15/2023   K 4.2 10/15/2023   CO2 25 10/15/2023   BUN 8 10/15/2023   CREATININE 0.57 10/15/2023   GFRNONAA >60 10/15/2023   CALCIUM  8.4 (L) 10/15/2023   PHOS 2.4 (L) 10/13/2023   ALBUMIN 3.2 (L) 10/13/2023   GLUCOSE 101 (H) 10/15/2023   Lab Results  Component Value Date   WBC 3.7 (L) 10/15/2023   HGB 10.6 (L) 10/15/2023   HCT 36.4 10/15/2023   MCV 81.3 10/15/2023   PLT 190 10/15/2023   Commons side effects, risks, benefits, and alternatives for medications and treatment plan prescribed today were discussed, and the patient expressed understanding of the given instructions. Patient is instructed  to call or message via MyChart if he/she has any questions or concerns regarding our treatment plan. No barriers to understanding were identified. We discussed Red Flag symptoms and signs in detail. Patient expressed understanding regarding what to do in case of urgent or emergency type symptoms.  Medication list was reconciled, printed and provided to the patient in AVS. Patient instructions and summary information was reviewed with the patient as documented in the AVS. This note was prepared with assistance of Dragon voice recognition software. Occasional wrong-word or sound-a-like substitutions may have occurred due to the inherent limitation

## 2023-10-21 NOTE — Telephone Encounter (Signed)
 She was just seen by her PCP.  She should be on carvedilol , diltiazem  and now was started on lisinopril . there was an issue with which type of diltiazem  she was getting but that should have been resolved that she is getting the capsules and that was covered.  Hopefully she still has that prescription.  Carvedilol  can be refilled by either our office or Dr. Javan office.  Being on lisinopril  is also a great idea as well.  Perhaps we should have her follow-up with our CV[facility] hypertension clinic just to make sure that she is on as good stable regimen because of her A-fib.   Alm Clay, MD

## 2023-10-22 MED ORDER — CARVEDILOL 25 MG PO TABS: ORAL_TABLET | ORAL | 3 refills | Status: AC

## 2023-10-24 ENCOUNTER — Telehealth: Payer: Self-pay | Admitting: Family Medicine

## 2023-10-24 NOTE — Telephone Encounter (Signed)
 Form has been placed in providers box

## 2023-10-24 NOTE — Telephone Encounter (Signed)
 [facility] faxed Home Health Certificate (Order [place] [id number]), to be filled out by provider. Patient requested to send it back via Fax within ASAP. Document is located in providers tray at front office.Please advise at [phone].

## 2023-10-25 DIAGNOSIS — I480 Paroxysmal atrial fibrillation: Secondary | ICD-10-CM

## 2023-10-25 DIAGNOSIS — G8929 Other chronic pain: Secondary | ICD-10-CM

## 2023-10-25 DIAGNOSIS — F419 Anxiety disorder, unspecified: Secondary | ICD-10-CM

## 2023-10-25 DIAGNOSIS — F32A Depression, unspecified: Secondary | ICD-10-CM

## 2023-10-25 DIAGNOSIS — E119 Type 2 diabetes mellitus without complications: Secondary | ICD-10-CM | POA: Diagnosis not present

## 2023-10-25 DIAGNOSIS — I10 Essential (primary) hypertension: Secondary | ICD-10-CM | POA: Diagnosis not present

## 2023-10-25 DIAGNOSIS — G4700 Insomnia, unspecified: Secondary | ICD-10-CM

## 2023-10-25 DIAGNOSIS — J4489 Other specified chronic obstructive pulmonary disease: Secondary | ICD-10-CM | POA: Diagnosis not present

## 2023-10-25 DIAGNOSIS — N390 Urinary tract infection, site not specified: Secondary | ICD-10-CM | POA: Diagnosis not present

## 2023-10-25 DIAGNOSIS — D472 Monoclonal gammopathy: Secondary | ICD-10-CM

## 2023-10-25 DIAGNOSIS — E875 Hyperkalemia: Secondary | ICD-10-CM

## 2023-10-25 DIAGNOSIS — R339 Retention of urine, unspecified: Secondary | ICD-10-CM

## 2023-10-27 NOTE — Progress Notes (Unsigned)
 Cardiology Office Note   Date:  10/28/2023  ID:  Prisca, Gearing Sep 22, 1955, MRN [id number] PCP: [name], MD   HeartCare Providers Cardiologist:  Alm Clay, MD   History of Present Illness KARLYNN FURROW is a 69 y.o. female with past medical history of atrial fibrillation here for follow-up appointment.  Was seen 05/14/2023 and had been doing her best to lose weight and increase exercise.  Adjusting her diet.  Was also doing some water aerobics and walking.  Considered some nutrition counseling.  No suggestion of breakthrough spells of her atrial fibrillation and no other cardiac symptoms.  Was considering switching to a different GLP-1 agonist to be more effective with weight loss.  Also limited by joint pain and less exercise intolerance.  She occasionally experiences some chest pain but is not exertional.  She has not experienced any recent episodes of atrial fibrillation or heart racing/palpitations.  No dizziness or lightheadedness at her last visit.  No TIA.  She also reports no dyspnea, orthopnea, or PND.  No peripheral edema.  She was not taking diltiazem  since she had run out of it a month ago.  She was also not taking any of her diabetic medications like Jardiance  or Farxiga as she was switched to Mounjaro .  Today, she presents with a hx of atrial fibrillation and diabetes who presents for cardiovascular follow-up.  She was hospitalized earlier this month for a urinary tract infection, during which she experienced a fall. She has a history of multiple falls, including one in the summer that required an emergency room visit. Some falls were mechanical. She was last seen in May and was not experiencing atrial fibrillation at that time. She engages in light exercise and dietary adjustments for weight loss.  She experiences persistent shortness of breath with movement and intermittent swelling in her feet and ankles which has been chronic.  She denies  significant chest pain or palpitations. Her current medications include carvedilol , Cardizem , Eliquis , aspirin , Lasix  as needed, lisinopril , Crestor , Mounjaro , and metformin . Recent lab work showed an A1c of 5.8, LDL of 58, and triglycerides of 128. She has a history of a heart murmur.  Reports no chest pain, pressure, or tightness. No edema, orthopnea, PND. Reports no palpitations.   Discussed the use of AI scribe software for clinical note transcription with the patient, who gave verbal consent to proceed.  ROS: Pertinent ROS in HPI  Studies Reviewed     12/30/21 Echo IMPRESSIONS     1. Left ventricular ejection fraction, by estimation, is 60 to 65%. The  left ventricle has normal function. The left ventricle has no regional  wall motion abnormalities. There is moderate left ventricular hypertrophy  of the basal-septal segment. Left  ventricular diastolic parameters are consistent with Grade I diastolic  dysfunction (impaired relaxation).   2. Right ventricular systolic function is normal. The right ventricular  size is normal. There is normal pulmonary artery systolic pressure.   3. The mitral valve is normal in structure. Trivial mitral valve  regurgitation. No evidence of mitral stenosis.   4. The aortic valve is tricuspid. Aortic valve regurgitation is not  visualized. No aortic stenosis is present.   5. The inferior vena cava is normal in size with greater than 50%  respiratory variability, suggesting right atrial pressure of 3 mmHg.    Risk Assessment/Calculations  CHA2DS2-VASc Score = 5  This indicates a 7.2% annual risk of stroke. The patient's score is based upon: CHF History: 0 HTN  History: 1 Diabetes History: 1 Stroke History: 0 Vascular Disease History: 1 Age Score: 1 Gender Score: 1            Physical Exam VS:  BP 122/68 (BP Location: Left Arm, Patient Position: Sitting, Cuff Size: Large)   Pulse 65   Ht 5' 4 (1.626 m)   Wt 233 lb (105.7 kg)   SpO2  94%   BMI 39.99 kg/m        Wt Readings from Last 3 Encounters:  10/28/23 233 lb (105.7 kg)  10/21/23 236 lb 6.4 oz (107.2 kg)  10/10/23 233 lb 4 oz (105.8 kg)    GEN: Well nourished, well developed in no acute distress NECK: No JVD; No carotid bruits CARDIAC: RRR, no murmurs, rubs, gallops RESPIRATORY:  Clear to auscultation without rales, wheezing or rhonchi  ABDOMEN: Soft, non-tender, non-distended EXTREMITIES:  No edema; No deformity   ASSESSMENT AND PLAN  Persistent atrial fibrillation Heart rate and blood pressure controlled with carvedilol , diltiazem , and Eliquis . - Continue carvedilol  25 mg twice daily. - Continue diltiazem  for blood pressure and rhythm control. - Continue Eliquis  for anticoagulation.  Edema of lower extremities Intermittent swelling managed with Lasix . - Continue Lasix  20 mg as needed for edema.  Heart murmur Chronic condition with stable echocardiogram findings.  Recurrent falls Falls due to mechanical reasons and recent UTI. No new atrial fibrillation or mentation changes.  Type 2 diabetes mellitus Good glycemic control with A1c at 5.8. Nausea may be medication-related. - Continue Mounjaro  for diabetes management. - Continue metformin . - Discuss potential medication adjustments with primary care if nausea persists.  Morbid obesity due to excess calories Managed with Mounjaro , exercise, and dietary changes. - Continue Mounjaro  for weight management.  Chronic nausea Intermittent nausea managed with Phenergan . Possible relation to diabetes medications. - Prescribe 30-day supply of Phenergan  as needed for nausea. - Discuss with primary care if nausea persists or worsens.     Dispo: She can follow-up in 6 months with Dr. Anner   Signed, Orren LOISE Fabry, PA-C

## 2023-10-28 ENCOUNTER — Ambulatory Visit: Attending: Physician Assistant | Admitting: Physician Assistant

## 2023-10-28 VITALS — BP 122/68 | HR 65 | Ht 64.0 in | Wt 233.0 lb

## 2023-10-28 DIAGNOSIS — E785 Hyperlipidemia, unspecified: Secondary | ICD-10-CM | POA: Diagnosis not present

## 2023-10-28 DIAGNOSIS — I4819 Other persistent atrial fibrillation: Secondary | ICD-10-CM

## 2023-10-28 DIAGNOSIS — E1159 Type 2 diabetes mellitus with other circulatory complications: Secondary | ICD-10-CM | POA: Diagnosis not present

## 2023-10-28 DIAGNOSIS — E1169 Type 2 diabetes mellitus with other specified complication: Secondary | ICD-10-CM

## 2023-10-28 DIAGNOSIS — D6869 Other thrombophilia: Secondary | ICD-10-CM

## 2023-10-28 DIAGNOSIS — Z6841 Body Mass Index (BMI) 40.0 and over, adult: Secondary | ICD-10-CM

## 2023-10-28 DIAGNOSIS — I152 Hypertension secondary to endocrine disorders: Secondary | ICD-10-CM

## 2023-10-28 MED ORDER — PROMETHAZINE HCL 12.5 MG PO TABS: 12.5000 mg | ORAL_TABLET | Freq: Two times a day (BID) | ORAL | 0 refills | Status: AC | PRN

## 2023-10-28 NOTE — Patient Instructions (Signed)
 Medication Instructions:  Your physician recommends that you continue on your current medications as directed. Please refer to the Current Medication list given to you today. *If you need a refill on your cardiac medications before your next appointment, please call your pharmacy*  Lab Work: NONE ORDERED If you have labs (blood work) drawn today and your tests are completely normal, you will receive your results only by: MyChart Message (if you have MyChart) OR A paper copy in the mail If you have any lab test that is abnormal or we need to change your treatment, we will call you to review the results.  Testing/Procedures: NONE ORDERED  Follow-Up: At [facility], you and your health needs are our priority.  As part of our continuing mission to provide you with exceptional heart care, our providers are all part of one team.  This team includes your primary Cardiologist (physician) and Advanced Practice Providers or APPs (Physician Assistants and Nurse Practitioners) who all work together to provide you with the care you need, when you need it.  Your next appointment:   6 month(s)  Provider:   Alm Clay, MD    We recommend signing up for the patient portal called MyChart.  Sign up information is provided on this After Visit Summary.  MyChart is used to connect with patients for Virtual Visits (Telemedicine).  Patients are able to view lab/test results, encounter notes, upcoming appointments, etc.  Non-urgent messages can be sent to your provider as well.   To learn more about what you can do with MyChart, go to [email or web address].   Other Instructions

## 2023-11-06 ENCOUNTER — Ambulatory Visit: Payer: Self-pay

## 2023-11-06 NOTE — Telephone Encounter (Signed)
 FYI Only or Action Required?: FYI only for provider: appointment scheduled on 10/31.  Patient was last seen in primary care on 10/21/2023 by [name], MD.  Called Nurse Triage reporting Vaginal Discharge.  Symptoms began yesterday.  Interventions attempted: Rest, hydration, or home remedies.  Symptoms are: gradually worsening.  Triage Disposition: See Physician Within 24 Hours  Patient/caregiver understands and will follow disposition?: Yes  Message from Alfonso ORN sent at 11/06/2023 10:27 AM EDT  Reason for Triage: itch on outside of vagina when urinating , had uti and was given antibiotics and wanted to call to see if something else can be given  callback:[phone]   Reason for Disposition  MODERATE-SEVERE itching (i.e., interferes with school, work, or sleep)  Answer Assessment - Initial Assessment Questions 10/2-10/7- admitted and given ABX for UTI and dehydration Yesterday 10/28 patient started having Burning and itching on the outside of her vaginal lips. It only happens when she urinates and has to wipe the urine off.  Denies swelling, denies being able to look at it. Unsure if her previous UTI resolved but was not bothering her at her HFU appt.  Due to ride availability appt made for Friday with PCP. Patient will call back with any worsening or new symptoms. Advised to keep area clean, can try antihistamine to stop itching, or witch hazel to the area.   1. SYMPTOM: What's the main symptom you're concerned about? (e.g., pain, itching, dryness)     Vaginal burning and itching on her vaginal lips 2. LOCATION: Where is the  burning and itching located? (e.g., inside/outside, left/right)     Outside on both sides 3. ONSET: When did the  itching and burning  start?     Started back yesterday  4. PAIN: Is there any pain? If Yes, ask: How bad is it? (Scale: 1-10; mild, moderate, severe)     10/10 burning pain when she rubs tissue on it 5. ITCHING: Is there any  itching? If Yes, ask: How bad is it? (Scale: 1-10; mild, moderate, severe)     10/10 itching 6. CAUSE: What do you think is causing the discharge? Have you had the same problem before? What happened then?     Unsure if fully got rid of her UTI in hospital. 7. OTHER SYMPTOMS: Do you have any other symptoms? (e.g., fever, itching, vaginal bleeding, pain with urination, injury to genital area, vaginal foreign body)     Denies.  Protocols used: Vaginal Symptoms-A-AH

## 2023-11-08 ENCOUNTER — Encounter: Payer: Self-pay | Admitting: Family Medicine

## 2023-11-08 ENCOUNTER — Other Ambulatory Visit (HOSPITAL_COMMUNITY)
Admission: RE | Admit: 2023-11-08 | Discharge: 2023-11-08 | Disposition: A | Discharge status: Home / Self Care | Source: Ambulatory Visit | Attending: Family Medicine | Admitting: Family Medicine

## 2023-11-08 ENCOUNTER — Ambulatory Visit (INDEPENDENT_AMBULATORY_CARE_PROVIDER_SITE_OTHER): Admitting: Family Medicine

## 2023-11-08 VITALS — BP 138/60 | HR 71 | Temp 98.3°F | Ht 64.0 in | Wt 234.4 lb

## 2023-11-08 DIAGNOSIS — N898 Other specified noninflammatory disorders of vagina: Secondary | ICD-10-CM | POA: Insufficient documentation

## 2023-11-08 DIAGNOSIS — E119 Type 2 diabetes mellitus without complications: Secondary | ICD-10-CM

## 2023-11-08 DIAGNOSIS — Z7985 Long-term (current) use of injectable non-insulin antidiabetic drugs: Secondary | ICD-10-CM

## 2023-11-08 DIAGNOSIS — B3731 Acute candidiasis of vulva and vagina: Secondary | ICD-10-CM | POA: Diagnosis not present

## 2023-11-08 MED ORDER — TIRZEPATIDE 7.5 MG/0.5ML ~~LOC~~ SOAJ: 7.5000 mg | SUBCUTANEOUS | 3 refills | Status: AC

## 2023-11-08 MED ORDER — FLUCONAZOLE 150 MG PO TABS: ORAL_TABLET | ORAL | 0 refills | Status: DC

## 2023-11-08 NOTE — Progress Notes (Signed)
 Subjective  CC:  Chief Complaint  Patient presents with   Vaginal Itching    For the past 3 days and there is no change. Not using any medication  at this time     HPI: Stacy Campbell is a 68 y.o. female who presents to the office today to address the problems listed above in the chief complaint. Discussed the use of AI scribe software for clinical note transcription with the patient, who gave verbal consent to proceed.  History of Present Illness Stacy Campbell is a 68 year old female who presents with an external itch and possible yeast infection.  Pruritus and suspected vulvovaginal candidiasis - External pruritus present since Wednesday, localized to the outside of the genitalia - No vaginal discharge - Uncertain presence of rash - Persistent and irritating symptoms for several days - Suspects etiology may be yeast infection - History of eczema  Gastrointestinal symptoms related to medication - Nausea attributed to Mijero - Has not taken Mijero since Sunday due to delay in refill - Mijero dose is 7.5 mg - Difficulty with appetite, requiring effort to eat, but tolerable  Recent fall and safety concerns - Recent fall, causing distress - Lung alert in place as a precaution   Assessment  1. Vagina itching   2. Yeast vaginitis   3. Controlled type 2 diabetes mellitus without complication, without long-term current use of insulin (HCC)   4. Long-term (current) use of injectable non-insulin antidiabetic drugs      Plan  Assessment and Plan Assessment & Plan Acute vulvovaginal candidiasis Reports external pruritus since Wednesday, irritating but without discharge. Suspected yeast infection, confirmation with vaginal swab planned. - Instruct on performing a vaginal swab for yeast infection - Prescribe antifungal medication  Type 2 diabetes mellitus Has not taken Mijero since Sunday due to refill delay. Experiences nausea and decreased appetite, side  effects of medication, but finds them tolerable. On 7.5 mg dose, does not wish to lower it as nausea is not significant. - Send refills for Mijero    Follow up: as scheduled No orders of the defined types were placed in this encounter.  Meds ordered this encounter  Medications   tirzepatide  (MOUNJARO ) 7.5 MG/0.5ML Pen    Sig: Inject 7.5 mg into the skin once a week.    Dispense:  6 mL    Refill:  3   fluconazole (DIFLUCAN) 150 MG tablet    Sig: Take one tablet today; may repeat in 3 days if symptoms persist    Dispense:  2 tablet    Refill:  0     I reviewed the patients updated PMH, FH, and SocHx.  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 01/04/2020    Priority: High   Monoclonal gammopathy of unknown significance (MGUS) 01/23/2019    Priority: High   Controlled type 2 diabetes mellitus without complication, without long-term current use of insulin (HCC) 12/12/2018    Priority: High   Hyperlipidemia associated with type 2 diabetes mellitus (HCC) 12/12/2018    Priority: High   Asthma with COPD (HCC)  05/25/2013    Priority: High   Morbid obesity     Priority: High   Major depression, recurrent, chronic 03/07/2006    Priority: High   Hypertension associated with diabetes (HCC) 03/07/2006    Priority: High   Chronic low back pain 03/07/2006    Priority: High   Insomnia 03/07/2006    Priority: High   Obstructive sleep apnea hypopnea, mild 06/20/2022    Priority: Medium    Aortic ejection murmur 12/06/2021    Priority: Medium    DJD (degenerative joint disease) of cervical spine 09/06/2021     Priority: Medium    Chronic urticaria 06/12/2021    Priority: Medium    Degenerative joint disease (DJD) of lumbar spine 01/04/2020    Priority: Medium    History of hepatitis C 10/11/2017    Priority: Medium    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   aspirin  EC 81 MG tablet Take 81 mg by mouth in the morning.   Aspirin -Salicylamide-Caffeine (ARTHRITIS STRENGTH BC POWDER PO) Take 1 packet by mouth 2 (two) times daily as needed (for pain).   busPIRone  (BUSPAR ) 15 MG tablet Take 15 mg by mouth 3 (three) times daily.   carvedilol  (COREG ) 25 MG tablet TAKE 1 TABLET(25 MG) BY MOUTH TWICE DAILY WITH A MEAL   clobetasol  ointment (TEMOVATE ) 0.05 % Apply 1 Application topically 2 (two) times daily as needed.   cyanocobalamin  1000 MCG tablet Take 1 tablet (1,000 mcg total) by mouth daily.   desvenlafaxine (PRISTIQ) 100 MG 24 hr tablet Take 100 mg by mouth daily.   diltiazem  (CARDIZEM  CD) 360 MG 24 hr capsule Take 1 capsule (360 mg total) by mouth daily.   doxepin  (SINEQUAN ) 25 MG capsule Take 50 mg by mouth 2 (two) times daily.   ELIQUIS  5 MG TABS tablet TAKE 1 TABLET(5 MG) BY MOUTH TWICE DAILY   fluconazole (DIFLUCAN) 150 MG tablet Take one tablet today; may repeat in 3 days if symptoms persist   folic acid (FOLVITE) 1 MG tablet Take 1 tablet (1 mg total) by mouth daily.   furosemide  (LASIX ) 20 MG tablet  Take 1 tablet (20 mg total) by mouth daily as needed for edema. Take with potassium   lisinopril  (ZESTRIL ) 10 MG tablet Take 1 tablet (10 mg total) by mouth daily.   metFORMIN  (GLUCOPHAGE -XR) 500 MG 24 hr tablet Take 1 tablet (500 mg total) by mouth daily with breakfast.   montelukast  (SINGULAIR ) 10 MG tablet Take 10 mg by mouth at bedtime.   mycophenolate  (CELLCEPT ) 500 MG tablet Take 500 mg by mouth 2 (two) times daily.   nystatin  ointment (MYCOSTATIN ) Apply 1 Application topically 2 (two) times daily as needed (for yeast).   oxyCODONE -acetaminophen  (PERCOCET ) 10-325 MG tablet Take 1 tablet by mouth 5 (five) times daily as needed for pain.   potassium chloride  SA (KLOR-CON   M) 20 MEQ tablet Take 1 tablet (20 mEq total) by mouth daily as needed (leg swelling). Take with furosemide    promethazine  (PHENERGAN ) 12.5 MG tablet Take 1 tablet (12.5 mg total) by mouth every 12 (twelve) hours as needed for vomiting or nausea.   rosuvastatin  (CRESTOR ) 10 MG tablet Take 1 tablet (10 mg total) by mouth at bedtime. TAKE 1 TABLET(10 MG) BY MOUTH DAILY   tacrolimus (PROTOPIC) 0.1 % ointment Apply 1 Application topically 2 (two) times daily as needed (for itching).   tiZANidine  (ZANAFLEX ) 4 MG tablet Take 4 mg by mouth 2 (two) times daily as needed for muscle spasms.   [DISCONTINUED] tirzepatide  (MOUNJARO ) 7.5 MG/0.5ML Pen Inject 7.5 mg into the skin once a week.   Allergies: Patient is allergic to alitraq, hydrocodone , linalool, methylisothiazolinone, propylene glycol, aspirin , egg protein-containing drug products, hydrocodone -guaifenesin , latex, pineapple, and tape. Family History: Patient family history includes Alcoholism in her brother; Asthma in her brother, brother, brother, brother, father, and mother; Cancer in her father; Diabetes in her brother, brother, brother, brother, father, and mother; Eczema in her father; Hepatitis B in her daughter. Social History:  Patient  reports that she quit smoking about  15 years ago. Her smoking use included cigarettes. She started smoking about 52 years ago. She has a 9.3 pack-year smoking history. She has never used smokeless tobacco. She reports that she does not drink alcohol  and does not use drugs.  Review of Systems: Constitutional: Negative for fever malaise or anorexia Cardiovascular: negative for chest pain Respiratory: negative for SOB or persistent cough Gastrointestinal: negative for abdominal pain  Objective  Vitals: BP 138/60   Pulse 71   Temp 98.3 F (36.8 C)   Ht 5' 4 (1.626 m)   Wt 234 lb 6.4 oz (106.3 kg)   SpO2 96%   BMI 40.23 kg/m  General: no acute distress , A&Ox3  sounds bilaterally, CTAB with normal respiratory effort Skin:  Warm, no rashes Commons side effects, risks, benefits, and alternatives for medications and treatment plan prescribed today were discussed, and the patient expressed understanding of the given instructions. Patient is instructed to call or message via MyChart if he/she has any questions or concerns regarding our treatment plan. No barriers to understanding were identified. We discussed Red Flag symptoms and signs in detail. Patient expressed understanding regarding what to do in case of urgent or emergency type symptoms.  Medication list was reconciled, printed and provided to the patient in AVS. Patient instructions and summary information was reviewed with the patient as documented in the AVS. This note was prepared with assistance of Dragon voice recognition software. Occasional wrong-word or sound-a-like substitutions may have occurred due to the inherent limitations of voice recognition software

## 2023-11-08 NOTE — Patient Instructions (Signed)
 Please follow up as scheduled for your next visit with me: 12/30/2023   If you have any questions or concerns, please don't hesitate to send me a message via MyChart or call the office at [phone]. Thank you for visiting with us  today! It's our pleasure caring for you.

## 2023-11-11 LAB — CERVICOVAGINAL ANCILLARY ONLY
Bacterial Vaginitis (gardnerella): NEGATIVE
Candida Glabrata: NEGATIVE
Candida Vaginitis: NEGATIVE
Comment: NEGATIVE
Comment: NEGATIVE
Comment: NEGATIVE

## 2023-11-13 ENCOUNTER — Ambulatory Visit: Payer: Self-pay | Admitting: Family Medicine

## 2023-11-13 NOTE — Progress Notes (Signed)
 See mychart note Dear Ms. Gilliand, Your test for vaginal infection was all negative.  If your symptoms persist, please return for reevaluation. Sincerely, Dr. Jodie

## 2023-12-09 ENCOUNTER — Ambulatory Visit: Admitting: Podiatry

## 2023-12-17 ENCOUNTER — Telehealth: Payer: Self-pay | Admitting: Family Medicine

## 2023-12-17 NOTE — Telephone Encounter (Signed)
 [facility] faxed Home Health Certificate (Order [place] [id number]), to be filled out by provider. Patient requested to send it back via Fax within ASAP. Document is located in providers tray at front office.Please advise at [phone].

## 2023-12-18 NOTE — Telephone Encounter (Signed)
 Form has ben placed in PCP box for completion

## 2023-12-24 DIAGNOSIS — K7460 Unspecified cirrhosis of liver: Secondary | ICD-10-CM | POA: Diagnosis not present

## 2023-12-24 DIAGNOSIS — E119 Type 2 diabetes mellitus without complications: Secondary | ICD-10-CM | POA: Diagnosis not present

## 2023-12-24 DIAGNOSIS — F32A Depression, unspecified: Secondary | ICD-10-CM | POA: Diagnosis not present

## 2023-12-24 DIAGNOSIS — G8929 Other chronic pain: Secondary | ICD-10-CM | POA: Diagnosis not present

## 2023-12-24 DIAGNOSIS — G4700 Insomnia, unspecified: Secondary | ICD-10-CM | POA: Diagnosis not present

## 2023-12-24 DIAGNOSIS — I10 Essential (primary) hypertension: Secondary | ICD-10-CM | POA: Diagnosis not present

## 2023-12-24 DIAGNOSIS — I480 Paroxysmal atrial fibrillation: Secondary | ICD-10-CM | POA: Diagnosis not present

## 2023-12-24 DIAGNOSIS — F419 Anxiety disorder, unspecified: Secondary | ICD-10-CM | POA: Diagnosis not present

## 2023-12-24 DIAGNOSIS — M6282 Rhabdomyolysis: Secondary | ICD-10-CM | POA: Diagnosis not present

## 2023-12-24 DIAGNOSIS — D472 Monoclonal gammopathy: Secondary | ICD-10-CM | POA: Diagnosis not present

## 2023-12-24 DIAGNOSIS — J4489 Other specified chronic obstructive pulmonary disease: Secondary | ICD-10-CM | POA: Diagnosis not present

## 2023-12-30 ENCOUNTER — Ambulatory Visit: Admitting: Family Medicine

## 2023-12-30 ENCOUNTER — Encounter: Payer: Self-pay | Admitting: Family Medicine

## 2023-12-30 VITALS — BP 116/62 | HR 79 | Temp 98.2°F | Resp 14 | Ht 64.0 in | Wt 235.8 lb

## 2023-12-30 DIAGNOSIS — H1010 Acute atopic conjunctivitis, unspecified eye: Secondary | ICD-10-CM | POA: Diagnosis not present

## 2023-12-30 DIAGNOSIS — Z7985 Long-term (current) use of injectable non-insulin antidiabetic drugs: Secondary | ICD-10-CM | POA: Diagnosis not present

## 2023-12-30 DIAGNOSIS — I152 Hypertension secondary to endocrine disorders: Secondary | ICD-10-CM | POA: Diagnosis not present

## 2023-12-30 DIAGNOSIS — I4819 Other persistent atrial fibrillation: Secondary | ICD-10-CM | POA: Diagnosis not present

## 2023-12-30 DIAGNOSIS — E1159 Type 2 diabetes mellitus with other circulatory complications: Secondary | ICD-10-CM

## 2023-12-30 DIAGNOSIS — J302 Other seasonal allergic rhinitis: Secondary | ICD-10-CM | POA: Diagnosis not present

## 2023-12-30 DIAGNOSIS — K7469 Other cirrhosis of liver: Secondary | ICD-10-CM | POA: Diagnosis not present

## 2023-12-30 DIAGNOSIS — L301 Dyshidrosis [pompholyx]: Secondary | ICD-10-CM | POA: Diagnosis not present

## 2023-12-30 DIAGNOSIS — E119 Type 2 diabetes mellitus without complications: Secondary | ICD-10-CM

## 2023-12-30 LAB — HEMOGLOBIN A1C: Hgb A1c MFr Bld: 6.8 % — ABNORMAL HIGH (ref 4.6–6.5)

## 2023-12-30 MED ORDER — MONTELUKAST SODIUM 10 MG PO TABS: 10.0000 mg | ORAL_TABLET | Freq: Every day | ORAL | 3 refills | Status: AC

## 2023-12-30 NOTE — Patient Instructions (Addendum)
 Please return in 6 months for your annual complete physical; please come fasting.  For follow up on chronic medical conditions   I will release your lab results to you on your MyChart account with further instructions. You may see the results before I do, but when I review them I will send you a message with my report or have my assistant call you if things need to be discussed. Please reply to my message with any questions. Thank you!   If you have any questions or concerns, please don't hesitate to send me a message via MyChart or call the office at [phone]. Thank you for visiting with us  today! It's our pleasure caring for you.    VISIT SUMMARY: Today, you were seen for a severe skin flare-up and itching, particularly around your eyes. We also reviewed your chronic conditions, including hypertension, type 2 diabetes, hyperlipidemia, chronic atrial fibrillation, and cirrhosis due to hepatitis C. Additionally, we discussed your allergic rhinitis and the medications you are using to manage your symptoms.  YOUR PLAN: -CHRONIC DERMATITIS: Chronic dermatitis is a long-term skin condition that causes inflammation and itching. Your recent flare-up, especially around your eyes, has been severe. Despite recent cortisone injections, your symptoms persist. We will continue to monitor and adjust your treatment as needed.  -HYPERTENSION: Hypertension is high blood pressure. Your blood pressure is well-controlled with your current medications, including lisinopril , diltiazem , and Lasix  as needed. Continue taking these medications as prescribed.  -TYPE 2 DIABETES MELLITUS: Type 2 diabetes is a condition that affects the way your body processes blood sugar. Your diabetes is managed with Mounjaro . Continue taking Mounjaro  as prescribed.  -HYPERLIPIDEMIA: Hyperlipidemia is having high levels of fats (lipids) in your blood. Your cholesterol levels are managed with a statin. Continue taking your statin  medication as prescribed.  -CHRONIC ATRIAL FIBRILLATION: Chronic atrial fibrillation is an irregular and often rapid heart rate. Your blood pressure is well-controlled, and we will continue with your current management plan.  -CIRRHOSIS DUE TO HEPATITIS C: Cirrhosis is severe scarring of the liver caused by long-term liver damage, in your case due to hepatitis C. We will continue to monitor your liver function and manage any symptoms that arise.  -ALLERGIC RHINITIS: Allergic rhinitis is an allergic reaction that causes sneezing, congestion, and itching. You have been using Benadryl  and Sudafed, but these are not recommended due to potential effects on your heart and blood pressure. Continue using Zyrtec  for allergy  management, and we may consider adding Singulair  if needed.  INSTRUCTIONS: Please continue with your current medications for hypertension, diabetes, hyperlipidemia, and atrial fibrillation as prescribed. For your allergic rhinitis, discontinue Sudafed and continue using Zyrtec . We may add Singulair  if necessary. We will keep monitoring your chronic dermatitis and adjust treatment as needed. Follow up with us  if your symptoms persist or worsen.                      Contains text generated by Abridge.                                 Contains text generated by Abridge.

## 2023-12-30 NOTE — Progress Notes (Signed)
 "  Subjective  CC:  Chief Complaint  Patient presents with   Follow-up    Says she is doing well. No questions or concerns at this time.    Allergic Rhinitis     Says her allergies are bad. Treatment is not helping:Benadryl  and sudafed.     HPI: Stacy Campbell is a 68 y.o. female who presents to the office today for follow up of diabetes and problems listed above in the chief complaint.  Discussed the use of AI scribe software for clinical note transcription with the patient, who gave verbal consent to proceed.  History of Present Illness Stacy Campbell is a 68 year old female who presents with a severe skin flare-up and itching.  Dermatologic symptoms; I reviewed recent [facility] notes and treatment plan and labs. - Severe flare-up of chronic skin condition, primarily affecting the periorbital region - Intense pruritus described as 'miserable' - No improvement following recent injection administered by dermatologist Stable cbc, cmp, neg hep C RPR w/ + ab (cleared hep c infection)  Allergic and sinus symptoms - Sinus congestion and allergic symptoms - Uses Benadryl  and Allegra or Zyrtec  for symptom management - Previously treated with Singulair  - No chest pain or palpitations  Type 2 diabetes: Continues on Mounjaro  and metformin .  Tolerating both well.  Weight is stable.  No symptoms of hyper or hypoglycemia.  Hypertension managed with Cardizem , carvedilol , lisinopril  and as needed Lasix .  Blood pressure is now well-controlled she is tolerating all her medications.  She denies chest pain, palpitations or changes in edema.  She uses Lasix  about 3 times per week.  I reviewed her most recent CMP showing normal potassium and renal function on her medications.  She has chronic A-fib on Eliquis  and Cardizem .  She is rate controlled.  Depression is stable per her report Cymbalta .    Wt Readings from Last 3 Encounters:  12/30/23 235 lb 12.8 oz (107 kg)  11/08/23 234 lb  6.4 oz (106.3 kg)  10/28/23 233 lb (105.7 kg)    BP Readings from Last 3 Encounters:  12/30/23 116/62  11/08/23 138/60  10/28/23 122/68    Assessment  1. Controlled type 2 diabetes mellitus without complication, without long-term current use of insulin  (HCC)   2. Hypertension associated with diabetes (HCC)   3. Other cirrhosis of liver (HCC)   4. Persistent atrial fibrillation (HCC)   5. Seasonal and perennial allergic rhinoconjunctivitis   6. Dyshidrotic eczema      Plan  Assessment and Plan Assessment & Plan Chronic dermatitis/eczema with acute flare now being manage with injectable medication. Recent flare-up with severe itching, particularly around the eyes. Recent cataract surgery may have exacerbated symptoms. Current treatment includes cortisone injections, but symptoms persist.  Seasonal allergies are reactive: Restart Zyrtec  and Singulair .  Stop Sudafed and Benadryl .  Hypertension Blood pressure is well-controlled with current medication regimen, including lisinopril , diltiazem , carvedilol  and Lasix  as needed. - Continue current antihypertensive medications. - Renal function and electrolytes are stable  Type 2 diabetes mellitus Diabetes is managed with Mounjaro . - Continue Mounjaro  for diabetes management. - Control has been excellent.  Will recheck A1c today.  Continue diabetic diet.  On ACE inhibitor and statin.  Immunizations are current eye exam is current  Hyperlipidemia Cholesterol levels are managed with a statin. - Continue statin therapy.  Chronic atrial fibrillation Blood pressure is well-controlled.  On Cardizem  and Eliquis . - Continue current management.  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  Follow up: 6 months for complete physical and chronic problems follow-up Orders Placed This Encounter  Procedures   Hemoglobin A1c   Meds ordered this encounter  Medications   montelukast  (SINGULAIR ) 10 MG tablet    Sig:  Take 1 tablet (10 mg total) by mouth at bedtime.    Dispense:  90 tablet    Refill:  3      Immunization History  Administered Date(s) Administered   Fluad Quad(high Dose 65+) 11/18/2020, 10/06/2021   Fluad Trivalent(High Dose 65+) 10/01/2022   Hepatitis B, ADULT 10/12/2014, 11/09/2014, 04/11/2015   INFLUENZA, HIGH DOSE SEASONAL PF 09/30/2023, 09/30/2023   Influenza Split 11/14/2010, 10/09/2011   Influenza Whole 10/13/2007, 01/10/2009, 11/22/2009   Influenza,inj,Quad PF,6+ Mos 10/10/2012, 10/08/2013, 10/15/2014, 09/29/2015, 11/06/2016, 09/10/2017, 10/15/2018, 09/30/2019   PFIZER(Purple Top)SARS-COV-2 Vaccination 03/20/2019, 04/15/2019, 10/15/2019   PNEUMOCOCCAL CONJUGATE-20 11/18/2020   Pneumococcal Polysaccharide-23 01/10/2009   Td 01/09/1999, 01/10/2009    Diabetes Related Lab Review: Lab Results  Component Value Date   HGBA1C 5.8 (A) 09/30/2023   HGBA1C 6.5 06/27/2023   HGBA1C 5.8 (A) 03/22/2023    Lab Results  Component Value Date   MICROALBUR <0.7 06/27/2023   Lab Results  Component Value Date   CREATININE 0.86 10/21/2023   BUN 8 10/21/2023   NA 142 10/21/2023   K 3.7 10/21/2023   CL 106 10/21/2023   CO2 25 10/21/2023   Lab Results  Component Value Date   CHOL 126 06/27/2023   CHOL 108 06/20/2022   CHOL 102 02/24/2021   Lab Results  Component Value Date   HDL 42.50 06/27/2023   HDL 44.30 06/20/2022   HDL 36.30 (L) 02/24/2021   Lab Results  Component Value Date   LDLCALC 58 06/27/2023   LDLCALC 42 06/20/2022   LDLCALC 27 02/24/2021   Lab Results  Component Value Date   TRIG 128.0 06/27/2023   TRIG 111.0 06/20/2022   TRIG 194.0 (H) 02/24/2021   Lab Results  Component Value Date   CHOLHDL 3 06/27/2023   CHOLHDL 2 06/20/2022   CHOLHDL 3 02/24/2021   No results found for: LDLDIRECT The ASCVD Risk score (Arnett DK, et al., 2019) failed to calculate for the following reasons:   The valid total cholesterol range is 130 to 320 mg/dL I have  reviewed the PMH, Fam and Soc history. Patient Active Problem List   Diagnosis Date Noted Date Diagnosed   Mixed simple and mucopurulent chronic bronchitis (HCC) 06/20/2022     Priority: High   Chronic narcotic dependence (HCC) 06/20/2022     Priority: High   Polypharmacy 06/20/2022     Priority: High   Persistent atrial fibrillation (HCC) 10/04/2021     Priority: High   Secondary hypercoagulable state 10/04/2021     Priority: High   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 01/04/2020     Priority: High   Monoclonal gammopathy of unknown significance (MGUS) 01/23/2019     Priority: High   Controlled type 2 diabetes mellitus without complication, without long-term current use of insulin  (HCC) 12/12/2018     Priority: High    Diagnosed 2020 Metformin  500 xr daily Mounjaro   Stopped metformin   03/2023 due to excellent diabetic control with mounjaro     Hyperlipidemia associated with type 2 diabetes mellitus (HCC) 12/12/2018     Priority: High    LDL goal of 70 given diabetes    Asthma with COPD (HCC) 05/25/2013     Priority: High   Morbid obesity  Priority: High   Major depression, recurrent, chronic 03/07/2006     Priority: High    Sees psychiatry: Laymon Glatter, FNP; [facility] Psychotherapy with Rosaline Angus, PsyD    Hypertension associated with diabetes (HCC) 03/07/2006     Priority: High   Chronic low back pain 03/07/2006     Priority: High   Insomnia 03/07/2006     Priority: High    On chronic Ambien  (started by previous provider).     Obstructive sleep apnea hypopnea, mild 06/20/2022     Priority: Medium     2024, [facility]; mild. No treatment recommended    Aortic ejection murmur 12/06/2021     Priority: Medium    DJD (degenerative joint disease) of cervical spine 09/06/2021     Priority: Medium    Chronic urticaria 06/12/2021     Priority: Medium     Has had chronic pred use for years/eczema    Degenerative joint disease (DJD) of lumbar spine  01/04/2020     Priority: Medium    History of hepatitis C 10/11/2017     Priority: Medium     S/P Treatment with sofosbuvir and simeprivir.  Followed by hep clinic.  Undetectable viral load - Cured.    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 Her smoking use included cigarettes. She started smoking about 52 years ago. She has a 9.3 pack-year smoking history. She has never used smokeless tobacco. She reports that she does not drink alcohol  and does not use drugs.  Review of Systems: Ophthalmic: negative for eye pain, loss of vision or double vision Cardiovascular: negative for chest pain Respiratory: negative for SOB or persistent cough Gastrointestinal: negative for abdominal pain Genitourinary: negative for dysuria or gross hematuria MSK: negative for foot lesions Neurologic: negative for weakness or gait disturbance  Objective  Vitals: BP 116/62   Pulse 79   Temp 98.2 F (36.8 C) (Temporal)   Resp 14   Ht 5' 4 (1.626 m)   Wt 235 lb 12.8 oz (107 kg)   SpO2 98%   BMI 40.47 kg/m  General: well appearing, no acute distress  Psych:  Alert and oriented, normal mood and affect HEENT:  Normocephalic, atraumatic, moist mucous membranes, supple neck  Cardiovascular: Irregular irregular  respiratory:  Good breath sounds bilaterally, CTAB with normal effort, no rales nl proprioception and sensation to monofilament testing bilaterally, +2 distal pulses bilaterally  Diabetic education:  ongoing education regarding chronic disease management for diabetes was given today. We continue to reinforce the ABC's of diabetic management: A1c (<7 or 8 dependent upon patient), tight blood pressure control, and cholesterol management with goal LDL < 100 minimally. We discuss diet strategies, exercise recommendations, medication options and possible side effects. At each visit, we review recommended immunizations and preventive care recommendations for diabetics and stress that good diabetic control can prevent other problems. See below for this patient's data. Commons side effects,  risks, benefits, and alternatives for medications and treatment plan prescribed today were discussed, and the patient expressed understanding of the given instructions. Patient is instructed to call or message via MyChart if he/she has any questions or concerns regarding our treatment plan. No barriers to understanding were identified. We discussed Red Flag symptoms and signs in detail. Patient expressed understanding regarding what to do in case of urgent or emergency type symptoms.  Medication list was reconciled, printed and provided to the patient in AVS. Patient instructions and summary information was reviewed with the patient as documented in the AVS. This note was prepared with assistance of Dragon voice recognition software. Occasional wrong-word or sound-a-like substitutions may have occurred due to the inherent limitations of voice recognition software   "

## 2024-01-03 ENCOUNTER — Ambulatory Visit: Payer: Self-pay | Admitting: Family Medicine

## 2024-01-03 NOTE — Progress Notes (Signed)
 See mychart note

## 2024-01-06 ENCOUNTER — Encounter: Payer: Self-pay | Admitting: Podiatry

## 2024-01-06 ENCOUNTER — Ambulatory Visit: Admitting: Podiatry

## 2024-01-06 DIAGNOSIS — B351 Tinea unguium: Secondary | ICD-10-CM | POA: Diagnosis not present

## 2024-01-06 DIAGNOSIS — M79674 Pain in right toe(s): Secondary | ICD-10-CM | POA: Diagnosis not present

## 2024-01-06 DIAGNOSIS — M79675 Pain in left toe(s): Secondary | ICD-10-CM

## 2024-01-06 DIAGNOSIS — E119 Type 2 diabetes mellitus without complications: Secondary | ICD-10-CM

## 2024-01-06 NOTE — Progress Notes (Signed)
 "     Subjective:  Patient ID: Stacy Campbell, female    DOB: 1955-10-02,  MRN: [id number]  Stacy Campbell presents to clinic today for:  Chief Complaint  Patient presents with   Diabetes    [facility] NIDDM A1C 6.8. Pt. Has eczema on skin.    Patient notes nails are thick, discolored, elongated and painful in shoegear when trying to ambulate.  She got her cataracts repaired since last seen.  She states that she has had a recent flareup of her eczema/dermatitis.  The steroids that she has been taking have also been causing her blood sugar to raise.  PCP is [name], MD. last seen 12/30/2023  Past Medical History:  Diagnosis Date   Angio-edema    Anxiety    Arthritis    lower back (01/29/2017)   Asthma    Bone spur    spine   Chronic lower back pain    Cirrhosis of liver (HCC) 12/2018   Depression    Diabetes mellitus without complication (HCC)    Eczema    GERD (gastroesophageal reflux disease)    Hepatitis C    was treated for this   Hyperplastic polyp of intestine    Hypertension    Insomnia    OSA (obstructive sleep apnea)    Pending formal sleep study   Osteoarthritis    Osteoporosis    Paroxysmal atrial fibrillation (HCC) 09/27/2021   cardioversion in 1990s; Noted to be in Afib-RVR when being evaluated by sleep medicine.   Splenic artery aneurysm    Urticaria    Past Surgical History:  Procedure Laterality Date   CARDIOVERSION  1990s   CARDIOVERSION N/A 10/18/2021   Procedure: CARDIOVERSION;  Surgeon: Jeffrie Oneil BROCKS, MD;  Location: [facility];  Service: Cardiovascular;  Laterality: N/A;   CHOLECYSTECTOMY N/A 01/22/2018   Procedure: LAPAROSCOPIC CHOLECYSTECTOMY ERAS PATHWAY;  Surgeon: Signe Mitzie LABOR, MD;  Location: [facility] ORS;  Service: General;  Laterality: N/A;   CYSTOSCOPY W/ URETERAL STENT PLACEMENT Left 01/29/2017   Procedure: CYSTOSCOPY WITH RETROGRADE PYELOGRAM/URETERAL STENT PLACEMENT;  Surgeon: Cam Morene ORN, MD;  [facility];   Service: Urology;  Laterality: Left;   CYSTOSCOPY WITH RETROGRADE PYELOGRAM, URETEROSCOPY AND STENT PLACEMENT Left 01/29/2017   CYSTOSCOPY WITH RETROGRADE PYELOGRAM/URETERAL STENT PLACEMENT   NM MYOVIEW  LTD  10/28/2017   Nuclear stress EF: 63%. The left ventricular ejection fraction is normal (55-65%). There was no ST segment deviation noted during stress. The study is normal.  This is a low risk study.   NM MYOVIEW  LTD  11/06/2021   ?  Myocardial infarction located in the anterior region? ->  Despite this, there is a EF of 71% with no RWMA.  READ AS LOW RISK/LOW RISK; in fact, on review of images the anterior defect appeared to be consistent with breast attenuation.  No evidence of ischemia or infarction.  Preserved EF 71%.   TRANSTHORACIC ECHOCARDIOGRAM  01/30/2017   Normal LV size and function-hyperdynamic/vigorous.  EF 65 to 70%. No RWMA.  Focal basal hypertrophy - Minimal LVOT gradient (2.6 m/s).  GRII DD.  Calcified AoV with no stenosis.  Mild MAC.  Trivial TR.  Peak PAP 55 mmHg.   Allergies  Allergen Reactions   Alitraq Nausea And Vomiting   Hydrocodone  Itching   Linalool Other (See Comments)    Reaction not known   Methylisothiazolinone Other (See Comments)    Positive patch test    Propylene Glycol Other (See Comments)  Reaction not known   Aspirin  Itching and Other (See Comments)    Lower dose doesn't make her itch (she takes it every day)   Egg Protein-Containing Drug Products Nausea Only and Other (See Comments)    No reaction if in another food   Hydrocodone -Guaifenesin  Rash   Latex Rash   Pineapple Itching and Nausea Only   Tape Rash and Other (See Comments)    Medical tape(s)    Review of Systems: Negative except as noted in the HPI.  Objective:  Stacy Campbell is a pleasant 68 y.o. female in NAD. AAO x 3.  Vascular Examination: Capillary refill time is 3-5 seconds to toes bilateral. Palpable pedal pulses b/l LE. Digital hair present b/l.  Skin  temperature gradient WNL b/l. No varicosities b/l. No cyanosis noted b/l.   Dermatological Examination: Pedal skin with normal turgor, texture and tone b/l. No open wounds. No interdigital macerations b/l. Toenails x10 are 3mm thick, discolored, dystrophic with subungual debris. There is pain with compression of the nail plates.  They are elongated x10     Latest Ref Rng & Units 12/30/2023   11:28 AM 09/30/2023   10:48 AM 06/27/2023   10:53 AM 03/22/2023   11:08 AM  Hemoglobin A1C  Hemoglobin-A1c 4.6 - 6.5 % 6.8  5.8  6.5  5.8    Assessment/Plan: 1. Pain due to onychomycosis of toenails of both feet   2. Type 2 diabetes mellitus without complication, without long-term current use of insulin  (HCC)     The mycotic toenails were sharply debrided x10 with sterile nail nippers and a power debriding burr to decrease bulk/thickness and length.    Return in about 3 months (around 04/05/2024) for [facility].   Stacy Campbell, DPM, FACFAS [facility]     [address] [place], [place] [address]                Office [phone]  Fax [phone] "

## 2024-01-07 ENCOUNTER — Other Ambulatory Visit: Payer: Self-pay | Admitting: Family Medicine

## 2024-01-10 ENCOUNTER — Other Ambulatory Visit: Payer: Self-pay | Admitting: Family Medicine

## 2024-01-10 DIAGNOSIS — J4489 Other specified chronic obstructive pulmonary disease: Secondary | ICD-10-CM

## 2024-01-22 ENCOUNTER — Other Ambulatory Visit: Payer: Self-pay

## 2024-01-22 ENCOUNTER — Telehealth: Payer: Self-pay | Admitting: Family Medicine

## 2024-01-22 MED ORDER — ACCU-CHEK SOFTCLIX LANCETS MISC: 12 refills | Status: AC

## 2024-01-22 MED ORDER — ACCU-CHEK AVIVA PLUS VI STRP: ORAL_STRIP | 12 refills | Status: AC

## 2024-01-22 MED ORDER — ACCU-CHEK AVIVA PLUS W/DEVICE KIT: PACK | 0 refills | Status: AC

## 2024-01-22 NOTE — Telephone Encounter (Signed)
 Unable to pend requested medication. Blood Glucose Monitoring Suppl (ACCU-CHEK AVIVA PLUS) w/Device KIT

## 2024-01-22 NOTE — Telephone Encounter (Signed)
 Rx sent

## 2024-01-22 NOTE — Telephone Encounter (Unsigned)
 Copied from CRM [id number]. Topic: Clinical - Medication Refill >> Jan 22, 2024 12:00 PM Tysheama G wrote: Medication: Blood Glucose Monitoring Suppl (ACCU-CHEK AVIVA PLUS) w/Device KIT   Has the patient contacted their pharmacy? Yes (Agent: If no, request that the patient contact the pharmacy for the refill. If patient does not wish to contact the pharmacy document the reason why and proceed with request.) (Agent: If yes, when and what did the pharmacy advise?)  This is the patient's preferred pharmacy:    [facility] - [place], [place] - [address] [place] [address] Phone: [phone] Fax: [phone]  Is this the correct pharmacy for this prescription? Yes If no, delete pharmacy and type the correct one.   Has the prescription been filled recently? No  Is the patient out of the medication? Yes  Has the patient been seen for an appointment in the last year OR does the patient have an upcoming appointment? Yes  Can we respond through MyChart? Yes  Agent: Please be advised that Rx refills may take up to 3 business days. We ask that you follow-up with your pharmacy.

## 2024-04-06 ENCOUNTER — Ambulatory Visit: Admitting: Podiatry

## 2024-04-28 ENCOUNTER — Ambulatory Visit: Admitting: Cardiology

## 2024-06-29 ENCOUNTER — Ambulatory Visit: Admitting: Family Medicine

## 2024-08-11 ENCOUNTER — Ambulatory Visit
